# Patient Record
Sex: Female | Born: 1950
Health system: Southern US, Community
[De-identification: ages and names within clinical notes are randomized; demographics above are authoritative.]

## PROBLEM LIST (undated history)

## (undated) DIAGNOSIS — E785 Hyperlipidemia, unspecified: Secondary | ICD-10-CM

## (undated) DIAGNOSIS — M9909 Segmental and somatic dysfunction of abdomen and other regions: Secondary | ICD-10-CM

## (undated) DIAGNOSIS — D259 Leiomyoma of uterus, unspecified: Secondary | ICD-10-CM

## (undated) DIAGNOSIS — Z8719 Personal history of other diseases of the digestive system: Secondary | ICD-10-CM

## (undated) DIAGNOSIS — Z8673 Personal history of transient ischemic attack (TIA), and cerebral infarction without residual deficits: Secondary | ICD-10-CM

## (undated) DIAGNOSIS — R32 Unspecified urinary incontinence: Secondary | ICD-10-CM

## (undated) DIAGNOSIS — Z860101 Personal history of adenomatous and serrated colon polyps: Secondary | ICD-10-CM

## (undated) DIAGNOSIS — I10 Essential (primary) hypertension: Secondary | ICD-10-CM

## (undated) DIAGNOSIS — K08109 Complete loss of teeth, unspecified cause, unspecified class: Secondary | ICD-10-CM

## (undated) DIAGNOSIS — T8859XA Other complications of anesthesia, initial encounter: Secondary | ICD-10-CM

## (undated) DIAGNOSIS — I2510 Atherosclerotic heart disease of native coronary artery without angina pectoris: Secondary | ICD-10-CM

## (undated) DIAGNOSIS — K449 Diaphragmatic hernia without obstruction or gangrene: Secondary | ICD-10-CM

## (undated) DIAGNOSIS — K219 Gastro-esophageal reflux disease without esophagitis: Secondary | ICD-10-CM

## (undated) DIAGNOSIS — G894 Chronic pain syndrome: Secondary | ICD-10-CM

## (undated) DIAGNOSIS — M1990 Unspecified osteoarthritis, unspecified site: Secondary | ICD-10-CM

## (undated) DIAGNOSIS — N3281 Overactive bladder: Secondary | ICD-10-CM

## (undated) DIAGNOSIS — G4733 Obstructive sleep apnea (adult) (pediatric): Secondary | ICD-10-CM

## (undated) DIAGNOSIS — Z8041 Family history of malignant neoplasm of ovary: Secondary | ICD-10-CM

## (undated) DIAGNOSIS — G709 Myoneural disorder, unspecified: Secondary | ICD-10-CM

## (undated) DIAGNOSIS — S92902A Unspecified fracture of left foot, initial encounter for closed fracture: Secondary | ICD-10-CM

## (undated) DIAGNOSIS — M2550 Pain in unspecified joint: Secondary | ICD-10-CM

## (undated) DIAGNOSIS — G4730 Sleep apnea, unspecified: Secondary | ICD-10-CM

## (undated) DIAGNOSIS — N200 Calculus of kidney: Secondary | ICD-10-CM

## (undated) DIAGNOSIS — J309 Allergic rhinitis, unspecified: Secondary | ICD-10-CM

## (undated) DIAGNOSIS — D219 Benign neoplasm of connective and other soft tissue, unspecified: Secondary | ICD-10-CM

## (undated) DIAGNOSIS — Z87442 Personal history of urinary calculi: Secondary | ICD-10-CM

## (undated) DIAGNOSIS — E7800 Pure hypercholesterolemia, unspecified: Secondary | ICD-10-CM

## (undated) DIAGNOSIS — M1711 Unilateral primary osteoarthritis, right knee: Secondary | ICD-10-CM

## (undated) DIAGNOSIS — N9489 Other specified conditions associated with female genital organs and menstrual cycle: Secondary | ICD-10-CM

## (undated) DIAGNOSIS — N939 Abnormal uterine and vaginal bleeding, unspecified: Secondary | ICD-10-CM

## (undated) DIAGNOSIS — IMO0002 Reserved for concepts with insufficient information to code with codable children: Secondary | ICD-10-CM

## (undated) DIAGNOSIS — J849 Interstitial pulmonary disease, unspecified: Secondary | ICD-10-CM

## (undated) DIAGNOSIS — G8929 Other chronic pain: Secondary | ICD-10-CM

## (undated) DIAGNOSIS — M7552 Bursitis of left shoulder: Secondary | ICD-10-CM

## (undated) DIAGNOSIS — R918 Other nonspecific abnormal finding of lung field: Secondary | ICD-10-CM

## (undated) DIAGNOSIS — L230 Allergic contact dermatitis due to metals: Secondary | ICD-10-CM

## (undated) DIAGNOSIS — R7303 Prediabetes: Secondary | ICD-10-CM

## (undated) DIAGNOSIS — I781 Nevus, non-neoplastic: Secondary | ICD-10-CM

## (undated) HISTORY — PX: DENTAL SURGERY: SHX609

## (undated) HISTORY — DX: Essential (primary) hypertension: I10

## (undated) HISTORY — DX: Gastro-esophageal reflux disease without esophagitis: K21.9

## (undated) HISTORY — PX: HAMMER TOE SURGERY: SHX385

## (undated) HISTORY — DX: Bursitis of left shoulder: M75.52

## (undated) HISTORY — DX: Sleep apnea, unspecified: G47.30

## (undated) HISTORY — DX: Allergic rhinitis, unspecified: J30.9

## (undated) HISTORY — DX: Reserved for concepts with insufficient information to code with codable children: IMO0002

## (undated) HISTORY — DX: Atherosclerotic heart disease of native coronary artery without angina pectoris: I25.10

## (undated) HISTORY — DX: Unilateral primary osteoarthritis, right knee: M17.11

## (undated) HISTORY — DX: Calculus of kidney: N20.0

## (undated) HISTORY — PX: CARPAL TUNNEL RELEASE: SHX101

## (undated) HISTORY — PX: CATARACT EXTRACTION: SUR2

## (undated) HISTORY — DX: Unspecified urinary incontinence: R32

## (undated) HISTORY — DX: Abnormal uterine and vaginal bleeding, unspecified: N93.9

## (undated) HISTORY — DX: Unspecified fracture of left foot, initial encounter for closed fracture: S92.902A

## (undated) HISTORY — DX: Pure hypercholesterolemia, unspecified: E78.00

## (undated) HISTORY — DX: Other chronic pain: G89.29

## (undated) HISTORY — DX: Interstitial pulmonary disease, unspecified: J84.9

## (undated) HISTORY — DX: Nevus, non-neoplastic: I78.1

## (undated) HISTORY — PX: CATARACT EXTRACTION W/ INTRAOCULAR LENS IMPLANT: SHX1309

## (undated) HISTORY — DX: Benign neoplasm of connective and other soft tissue, unspecified: D21.9

## (undated) HISTORY — PX: SEPTOPLASTY: SUR1290

---

## 1988-01-17 HISTORY — PX: HAMMER TOE SURGERY: SHX385

## 1988-01-17 HISTORY — PX: PELVIC LAPAROSCOPY: SHX162

## 1996-01-17 HISTORY — PX: CARPAL TUNNEL RELEASE: SHX101

## 1997-10-19 ENCOUNTER — Encounter: Payer: Self-pay | Admitting: Obstetrics and Gynecology

## 1997-10-19 ENCOUNTER — Ambulatory Visit (HOSPITAL_COMMUNITY): Admission: RE | Admit: 1997-10-19 | Discharge: 1997-10-19 | Payer: Self-pay | Admitting: Obstetrics and Gynecology

## 1998-01-16 HISTORY — PX: DILATION AND CURETTAGE OF UTERUS: SHX78

## 1998-01-16 HISTORY — PX: OTHER SURGICAL HISTORY: SHX169

## 1998-01-16 HISTORY — PX: NASAL SEPTUM SURGERY: SHX37

## 1998-03-18 ENCOUNTER — Ambulatory Visit (HOSPITAL_COMMUNITY): Admission: RE | Admit: 1998-03-18 | Discharge: 1998-03-18 | Payer: Self-pay | Admitting: Obstetrics and Gynecology

## 1998-03-30 ENCOUNTER — Ambulatory Visit: Admission: RE | Admit: 1998-03-30 | Discharge: 1998-03-30 | Payer: Self-pay | Admitting: *Deleted

## 1998-07-12 ENCOUNTER — Ambulatory Visit (HOSPITAL_COMMUNITY): Admission: RE | Admit: 1998-07-12 | Discharge: 1998-07-12 | Payer: Self-pay | Admitting: Specialist

## 1998-07-12 ENCOUNTER — Encounter: Payer: Self-pay | Admitting: Specialist

## 1998-08-03 ENCOUNTER — Ambulatory Visit (HOSPITAL_COMMUNITY): Admission: RE | Admit: 1998-08-03 | Discharge: 1998-08-03 | Payer: Self-pay | Admitting: Specialist

## 1998-08-03 ENCOUNTER — Encounter: Payer: Self-pay | Admitting: Specialist

## 1998-08-20 ENCOUNTER — Encounter: Payer: Self-pay | Admitting: Specialist

## 1998-08-20 ENCOUNTER — Ambulatory Visit (HOSPITAL_COMMUNITY): Admission: RE | Admit: 1998-08-20 | Discharge: 1998-08-20 | Payer: Self-pay | Admitting: Specialist

## 1998-09-16 ENCOUNTER — Other Ambulatory Visit: Admission: RE | Admit: 1998-09-16 | Discharge: 1998-09-16 | Payer: Self-pay | Admitting: *Deleted

## 1998-09-16 ENCOUNTER — Encounter (INDEPENDENT_AMBULATORY_CARE_PROVIDER_SITE_OTHER): Payer: Self-pay | Admitting: Specialist

## 1999-03-09 ENCOUNTER — Other Ambulatory Visit: Admission: RE | Admit: 1999-03-09 | Discharge: 1999-03-09 | Payer: Self-pay | Admitting: Obstetrics and Gynecology

## 2000-06-22 ENCOUNTER — Encounter: Payer: Self-pay | Admitting: Obstetrics and Gynecology

## 2000-06-22 ENCOUNTER — Ambulatory Visit (HOSPITAL_COMMUNITY): Admission: RE | Admit: 2000-06-22 | Discharge: 2000-06-22 | Payer: Self-pay | Admitting: Obstetrics and Gynecology

## 2000-07-06 ENCOUNTER — Other Ambulatory Visit: Admission: RE | Admit: 2000-07-06 | Discharge: 2000-07-06 | Payer: Self-pay | Admitting: Obstetrics and Gynecology

## 2000-07-06 ENCOUNTER — Encounter (INDEPENDENT_AMBULATORY_CARE_PROVIDER_SITE_OTHER): Payer: Self-pay

## 2000-11-30 ENCOUNTER — Ambulatory Visit (HOSPITAL_COMMUNITY): Admission: RE | Admit: 2000-11-30 | Discharge: 2000-11-30 | Payer: Self-pay | Admitting: Obstetrics and Gynecology

## 2000-11-30 ENCOUNTER — Encounter: Payer: Self-pay | Admitting: Obstetrics and Gynecology

## 2001-07-02 ENCOUNTER — Encounter: Payer: Self-pay | Admitting: Obstetrics and Gynecology

## 2001-07-02 ENCOUNTER — Ambulatory Visit (HOSPITAL_COMMUNITY): Admission: RE | Admit: 2001-07-02 | Discharge: 2001-07-02 | Payer: Self-pay | Admitting: Obstetrics and Gynecology

## 2001-09-02 ENCOUNTER — Ambulatory Visit (HOSPITAL_COMMUNITY): Admission: RE | Admit: 2001-09-02 | Discharge: 2001-09-02 | Payer: Self-pay | Admitting: Obstetrics and Gynecology

## 2001-09-02 ENCOUNTER — Encounter: Payer: Self-pay | Admitting: Obstetrics and Gynecology

## 2002-02-24 ENCOUNTER — Encounter: Payer: Self-pay | Admitting: Obstetrics and Gynecology

## 2002-02-24 ENCOUNTER — Ambulatory Visit (HOSPITAL_COMMUNITY): Admission: RE | Admit: 2002-02-24 | Discharge: 2002-02-24 | Payer: Self-pay | Admitting: Obstetrics and Gynecology

## 2002-05-26 ENCOUNTER — Encounter: Payer: Self-pay | Admitting: Obstetrics and Gynecology

## 2002-05-26 ENCOUNTER — Ambulatory Visit (HOSPITAL_COMMUNITY): Admission: RE | Admit: 2002-05-26 | Discharge: 2002-05-26 | Payer: Self-pay | Admitting: Obstetrics and Gynecology

## 2003-01-12 ENCOUNTER — Encounter: Admission: RE | Admit: 2003-01-12 | Discharge: 2003-01-12 | Payer: Self-pay | Admitting: Obstetrics and Gynecology

## 2003-01-26 ENCOUNTER — Encounter: Admission: RE | Admit: 2003-01-26 | Discharge: 2003-01-26 | Payer: Self-pay | Admitting: Obstetrics and Gynecology

## 2003-06-17 ENCOUNTER — Encounter: Admission: RE | Admit: 2003-06-17 | Discharge: 2003-06-17 | Payer: Self-pay | Admitting: Obstetrics and Gynecology

## 2003-07-29 ENCOUNTER — Other Ambulatory Visit: Admission: RE | Admit: 2003-07-29 | Discharge: 2003-07-29 | Payer: Self-pay | Admitting: Obstetrics and Gynecology

## 2004-03-24 ENCOUNTER — Encounter: Admission: RE | Admit: 2004-03-24 | Discharge: 2004-03-24 | Payer: Self-pay | Admitting: Obstetrics and Gynecology

## 2004-04-05 ENCOUNTER — Encounter: Admission: RE | Admit: 2004-04-05 | Discharge: 2004-04-05 | Payer: Self-pay | Admitting: Obstetrics and Gynecology

## 2004-08-15 ENCOUNTER — Encounter: Admission: RE | Admit: 2004-08-15 | Discharge: 2004-08-15 | Payer: Self-pay | Admitting: Obstetrics and Gynecology

## 2004-08-15 ENCOUNTER — Other Ambulatory Visit: Admission: RE | Admit: 2004-08-15 | Discharge: 2004-08-15 | Payer: Self-pay | Admitting: Obstetrics and Gynecology

## 2004-08-30 ENCOUNTER — Encounter: Admission: RE | Admit: 2004-08-30 | Discharge: 2004-08-30 | Payer: Self-pay | Admitting: Obstetrics and Gynecology

## 2004-10-25 ENCOUNTER — Ambulatory Visit (HOSPITAL_COMMUNITY): Admission: RE | Admit: 2004-10-25 | Discharge: 2004-10-25 | Payer: Self-pay | Admitting: Obstetrics and Gynecology

## 2005-05-30 ENCOUNTER — Ambulatory Visit (HOSPITAL_COMMUNITY): Admission: RE | Admit: 2005-05-30 | Discharge: 2005-05-30 | Payer: Self-pay | Admitting: Obstetrics and Gynecology

## 2005-08-15 ENCOUNTER — Other Ambulatory Visit: Admission: RE | Admit: 2005-08-15 | Discharge: 2005-08-15 | Payer: Self-pay | Admitting: Obstetrics and Gynecology

## 2005-08-31 ENCOUNTER — Encounter: Admission: RE | Admit: 2005-08-31 | Discharge: 2005-08-31 | Payer: Self-pay | Admitting: Obstetrics and Gynecology

## 2006-09-06 ENCOUNTER — Other Ambulatory Visit: Admission: RE | Admit: 2006-09-06 | Discharge: 2006-09-06 | Payer: Self-pay | Admitting: Obstetrics and Gynecology

## 2006-10-09 ENCOUNTER — Encounter: Admission: RE | Admit: 2006-10-09 | Discharge: 2006-10-09 | Payer: Self-pay | Admitting: Obstetrics and Gynecology

## 2007-10-08 ENCOUNTER — Other Ambulatory Visit: Admission: RE | Admit: 2007-10-08 | Discharge: 2007-10-08 | Payer: Self-pay | Admitting: Obstetrics and Gynecology

## 2007-10-16 ENCOUNTER — Encounter: Admission: RE | Admit: 2007-10-16 | Discharge: 2007-10-16 | Payer: Self-pay | Admitting: Obstetrics and Gynecology

## 2008-01-17 HISTORY — PX: UMBILICAL HERNIA REPAIR: SHX196

## 2008-01-17 HISTORY — PX: OTHER SURGICAL HISTORY: SHX169

## 2008-01-17 HISTORY — PX: ESOPHAGEAL DILATION: SHX303

## 2008-05-15 ENCOUNTER — Encounter: Admission: RE | Admit: 2008-05-15 | Discharge: 2008-05-15 | Payer: Self-pay | Admitting: Obstetrics and Gynecology

## 2008-10-21 ENCOUNTER — Other Ambulatory Visit: Admission: RE | Admit: 2008-10-21 | Discharge: 2008-10-21 | Payer: Self-pay | Admitting: Obstetrics and Gynecology

## 2008-11-24 ENCOUNTER — Encounter: Admission: RE | Admit: 2008-11-24 | Discharge: 2008-11-24 | Payer: Self-pay | Admitting: Obstetrics and Gynecology

## 2009-10-21 ENCOUNTER — Other Ambulatory Visit: Admission: RE | Admit: 2009-10-21 | Discharge: 2009-10-21 | Payer: Self-pay | Admitting: Obstetrics and Gynecology

## 2010-12-27 ENCOUNTER — Encounter: Payer: Self-pay | Admitting: Family Medicine

## 2010-12-27 DIAGNOSIS — G8929 Other chronic pain: Secondary | ICD-10-CM | POA: Insufficient documentation

## 2010-12-27 DIAGNOSIS — F32A Depression, unspecified: Secondary | ICD-10-CM | POA: Insufficient documentation

## 2010-12-27 DIAGNOSIS — F419 Anxiety disorder, unspecified: Secondary | ICD-10-CM | POA: Insufficient documentation

## 2010-12-27 DIAGNOSIS — J309 Allergic rhinitis, unspecified: Secondary | ICD-10-CM | POA: Insufficient documentation

## 2010-12-27 DIAGNOSIS — I10 Essential (primary) hypertension: Secondary | ICD-10-CM | POA: Insufficient documentation

## 2010-12-27 DIAGNOSIS — F329 Major depressive disorder, single episode, unspecified: Secondary | ICD-10-CM | POA: Insufficient documentation

## 2010-12-27 DIAGNOSIS — K219 Gastro-esophageal reflux disease without esophagitis: Secondary | ICD-10-CM | POA: Insufficient documentation

## 2010-12-27 DIAGNOSIS — G4730 Sleep apnea, unspecified: Secondary | ICD-10-CM | POA: Insufficient documentation

## 2010-12-29 ENCOUNTER — Ambulatory Visit (INDEPENDENT_AMBULATORY_CARE_PROVIDER_SITE_OTHER): Payer: BC Managed Care – PPO | Admitting: Family Medicine

## 2010-12-29 ENCOUNTER — Encounter: Payer: Self-pay | Admitting: Family Medicine

## 2010-12-29 DIAGNOSIS — R5381 Other malaise: Secondary | ICD-10-CM

## 2010-12-29 DIAGNOSIS — E669 Obesity, unspecified: Secondary | ICD-10-CM | POA: Insufficient documentation

## 2010-12-29 DIAGNOSIS — I10 Essential (primary) hypertension: Secondary | ICD-10-CM

## 2010-12-29 DIAGNOSIS — R5383 Other fatigue: Secondary | ICD-10-CM | POA: Insufficient documentation

## 2010-12-29 DIAGNOSIS — G8929 Other chronic pain: Secondary | ICD-10-CM

## 2010-12-29 MED ORDER — DOXYCYCLINE HYCLATE 100 MG PO CAPS: 100.0000 mg | ORAL_CAPSULE | Freq: Every day | ORAL | Status: AC

## 2010-12-29 MED ORDER — FLUCONAZOLE 150 MG PO TABS: 150.0000 mg | ORAL_TABLET | Freq: Once | ORAL | Status: AC

## 2010-12-29 NOTE — Patient Instructions (Signed)
It is good to see you. I will put in for labs bites decide if you are switching to me or not. If you are doing let's get the labs have a new check your cholesterol kidneys and liver. I really would like to see some of the other labs you have had in the last 2 years. We will try to contact her other doctors and get the information. If you have these please bring them to the front desk and we will make copies. I am giving you a prescription for doxycycline. Take one pill daily for the next 3 weeks and we will see if this improves everything. When taking this antibiotic in my give you a yeast infection so I will give you another medication just in case called Diflucan. I should probably see you again in about one month for adjustment as well as recheck your blood pressure.

## 2010-12-29 NOTE — Assessment & Plan Note (Signed)
Patient is not quite at goal we'll continue to monitor. Patient's will either bring in her labs were we will get some at the next visit. Patient may need a very low dose diuretic to help with the swelling as well as her blood pressure.

## 2010-12-29 NOTE — Assessment & Plan Note (Signed)
Encourage patient to attempt to lose weight. Told patient about diet as well as exercise and some low-impact exercises to try to avoid hurting her knee. Patient will come back again in the p.m. little more aggressive setting goals of 2-5 pounds every month of weight loss. Likely contributing to some of her chronic pain.

## 2010-12-29 NOTE — Progress Notes (Signed)
Subjective:    Patient ID: Kerry [name], female    DOB: Jan 30, 1950, 60 y.o.   MRN: [id number]  HPI New patient here to establish care. Patient is seen before in my moonlighting services for osteopathic manipulation therapy. Patient is here again for the same aches and pains she's had previously patient complains of some back and hip pain as well as right knee pain. Patient sees other specialists including the orthopedic physician who states that she does have some osteoarthritis of the right knee and she was to avoid any type of surgery. Patient did have significant amount of improvement when she had manipulation previously.  Patient has had a long history of swelling of her extremities with a lot of joint and muscle pain. Patient has been seen by multiple rheumatologist she states in the past. Last one was back at [facility] rheumatology and she's been tried on multiple medications and has not had any improvement. Patient states that she's had a lot of lab work done and all but has come back negative during this time. Patient does have a strong family history of rheumatoid arthritis in a sister as well as her mother and is concerned that this is the problem. Patient though is very sensitive to a lot of different medications and certain medications that other providers have encourage her to try she has not. Patient pain at this moment does take some over-the-counter ibuprofen and has been doing a lot of other natural herbs and supplementations. Patient has recently taken a probiotic which she states has helped with some of the swelling in her upper extremities.   History of hypertension-patient's blood pressure today is a little bit elevated at 150/82. On recheck the patient is 136/80. Patient has not taken any medications has been doing this mostly with diet and trying to do it naturally. Patient has not had labs for some time but states that she does have some at home but she will bring at her next  earliest convenience.  Review of Systems Denies fever, chills, nausea vomiting abdominal pain, dysuria, chest pain, shortness of breath dyspnea on exertion or numbness in extremities Past Medical History  Diagnosis Date  . Allergic rhinitis, cause unspecified   . Anxiety   . Depression   . Chronic pain   . GERD (gastroesophageal reflux disease)   . HTN (hypertension)   . Sleep apnea   . Migraines    Past Surgical History  Procedure Date  . Umbilical hernia repair 2010  . Carpal tunnel release 1998  . Dental implants 2010  . Esophageal dilation 2010   History   Social History  . Marital Status: Married    Spouse Name: N/A    Number of Children: N/A  . Years of Education: N/A   Occupational History  . Not on file.   Social History Main Topics  . Smoking status: Never Smoker   . Smokeless tobacco: Not on file  . Alcohol Use: No  . Drug Use: No  . Sexually Active: Yes   Other Topics Concern  . Not on file   Social History Narrative  . No narrative on file       Objective:   Physical Exam BP 136/82  Pulse 101  Temp(Src) 98.2 F (36.8 C) (Oral)  Ht 5' (1.524 m)  Wt 222 lb (100.699 kg)  BMI 43.36 kg/m2 General appearance: alert and cooperative obese Ears: normal TM's and external ear canals both ears Throat: lips, mucosa, and tongue normal; teeth and  gums normal Neck: no adenopathy, supple, symmetrical, trachea midline and thyroid not enlarged, symmetric, no tenderness/mass/nodules Lungs: clear to auscultation bilaterally Heart: regular rate and rhythm, S1, S2 normal, no murmur, click, rub or gallop Abdomen: soft, non-tender; bowel sounds normal; no masses,  no organomegaly Extremities: extremities normal, atraumatic, no cyanosis or edema patient has mild crepitus of the right knee and a positive McMurray's on the right side. In addition patient has some swelling of the proximal and distal phalanges of both hands. Pulses: 2+ and symmetric Skin: Skin color,  texture, turgor normal. No rashes or lesions patient does have a little bit of a malar rash on face. Neurologic: Grossly normal   OMT Findings: Cervical: C2 flexed rotated and side bent left Thoracic: T4 extended rotated and side bent right Lumbar: L2 rotated and side bent left Sacrum: Left on left  Assessment & Plan:

## 2010-12-29 NOTE — Assessment & Plan Note (Signed)
Patient has a long-standing history of this chronic pain type syndrome. Patient has been seen many specialists in the past and will get some of her notes and reviewed what labs are very been drawn. With patient's findings this is consistent with the potential of rheumatoid arthritis as well as lupus in the differential. Patient does have some signs of osteoarthritis of the right knee as well. Patient though is not on any narcotics and is not asking for many narcotics. We will attempt to see if pain will be well-controlled with Tylenol 650 mg 3 times a day have patient followup again in 3-4 weeks time.

## 2011-01-06 ENCOUNTER — Other Ambulatory Visit: Payer: BC Managed Care – PPO

## 2011-01-20 ENCOUNTER — Telehealth: Payer: Self-pay | Admitting: Family Medicine

## 2011-01-20 NOTE — Telephone Encounter (Signed)
Regarding records from Rheumatologist.

## 2011-01-20 NOTE — Telephone Encounter (Signed)
Pt states she will bring the records she has to her next OV 01/27/2011.

## 2011-01-26 ENCOUNTER — Telehealth: Payer: Self-pay | Admitting: Family Medicine

## 2011-01-26 NOTE — Telephone Encounter (Signed)
Kerry [name] wants to find out if Dr. Katrinka Blazing has gotten the Medical Records for her appointment on 1/11.  They are not in his box.  Please give her a call.

## 2011-01-26 NOTE — Telephone Encounter (Signed)
Have not seen, will forward to MD. Milas Gain, Maryjo Rochester

## 2011-01-26 NOTE — Telephone Encounter (Signed)
Pt informed. Kerry [name]  

## 2011-01-26 NOTE — Telephone Encounter (Signed)
Yes,  I have received over 100 pages and will be looking at them tonight. If you have time please call the pt and tell her otherwise I will tell her tomorrow.

## 2011-01-27 ENCOUNTER — Other Ambulatory Visit: Payer: Self-pay | Admitting: Family Medicine

## 2011-01-27 ENCOUNTER — Ambulatory Visit: Payer: Self-pay | Admitting: Family Medicine

## 2011-01-27 ENCOUNTER — Ambulatory Visit (INDEPENDENT_AMBULATORY_CARE_PROVIDER_SITE_OTHER): Payer: BC Managed Care – PPO | Admitting: Family Medicine

## 2011-01-27 ENCOUNTER — Encounter: Payer: Self-pay | Admitting: Family Medicine

## 2011-01-27 DIAGNOSIS — G8929 Other chronic pain: Secondary | ICD-10-CM

## 2011-01-27 DIAGNOSIS — R5383 Other fatigue: Secondary | ICD-10-CM

## 2011-01-27 DIAGNOSIS — I10 Essential (primary) hypertension: Secondary | ICD-10-CM

## 2011-01-27 DIAGNOSIS — E669 Obesity, unspecified: Secondary | ICD-10-CM

## 2011-01-27 DIAGNOSIS — M999 Biomechanical lesion, unspecified: Secondary | ICD-10-CM | POA: Insufficient documentation

## 2011-01-27 NOTE — Progress Notes (Signed)
Subjective:    Patient ID: Kerry [name], female    DOB: Jul 18, 1950, 61 y.o.   MRN: [id number]  HPI Patient is seen before in my moonlighting services for osteopathic manipulation therapy. Patient is here again for the same aches and pains she's had previously patient complains of some back and hip pain as well as right knee pain. Patient sees other specialists including the orthopedic physician who states that she does have some osteoarthritis of the right knee and she was to avoid any type of surgery. Patient did have significant amount of improvement when she had manipulation previously. Patient states though after last manipulation she did have a flare in the cervical region and like to avoid much in cervical area.   Patient has had a long history of swelling of her extremities with a lot of joint and muscle pain. Patient has been seen by multiple rheumatologist she states in the past.patient has had this chronic pain for some time has seen multiple rheumatologist during the time. Did have past medical office visits faxed to me. Reviewed them and at this time it appears that patient has been seronegative on autoimmune tests. Patient does have a strong family history of rheumatoid arthritis in a sister as well as her mother and is concerned that this is the problem. Patient states that whenever she tries to increase any of her activity she starts to have spasm of the muscle and so she stops any type of stretching or physical activity.  History of hypertension-patient's blood pressure today is a little bit elevated again 171/88. Recheck still elevated. Patient has not taken any medications has been doing this mostly with diet and trying to do it naturally. Patient's labs were faxed over unfortunately she has not had for greater than 15 months we will recheck labs at next appointment. Patient though did have a significant finding of hyperglycemia on one with an A1c of 6.2%  Patient has also had a cold  for the last couple weeks patient states that she's been having some head congestion mostly right-sided has had this in the past before. Patient states that she has had fever x100.5 the other day but since that time has not felt quite as fevers. Patient though still feels she has had congestion and would like evaluation. Patient likes to avoid that the headaches in any other type of medications at all costs. Review of Systems  Denies fever, chills, nausea vomiting abdominal pain, dysuria, chest pain, shortness of breath dyspnea on exertion or numbness in extremities Past Medical History  Diagnosis Date  . Allergic rhinitis, cause unspecified   . Anxiety   . Depression   . Chronic pain   . GERD (gastroesophageal reflux disease)   . HTN (hypertension)   . Sleep apnea   . Migraines    Past Surgical History  Procedure Date  . Umbilical hernia repair 2010  . Carpal tunnel release 1998  . Dental implants 2010  . Esophageal dilation 2010   History   Social History  . Marital Status: Married    Spouse Name: N/A    Number of Children: N/A  . Years of Education: N/A   Occupational History  . Not on file.   Social History Main Topics  . Smoking status: Never Smoker   . Smokeless tobacco: Not on file  . Alcohol Use: No  . Drug Use: No  . Sexually Active: Yes   Other Topics Concern  . Not on file   Social History  Narrative  . No narrative on file       Objective:   Physical Exam  BP 171/88  Pulse 99  Temp(Src) 98.3 F (36.8 C) (Oral)  Ht 5' (1.524 m)  Wt 221 lb (100.245 kg)  BMI 43.16 kg/m2 General appearance: alert and cooperative obese Ears: Patient shows signs of pus behind the right TM no erythema no dementia and nonbulging Neck: no adenopathy, supple, symmetrical, trachea midline and thyroid not enlarged, symmetric, no tenderness/mass/nodules Lungs: clear to auscultation bilaterally Heart: regular rate and rhythm, S1, S2 normal, no murmur, click, rub or  gallop Abdomen: soft, non-tender; bowel sounds normal; no masses,  no organomegaly Extremities: extremities normal, atraumatic, no cyanosis or edema In addition patient has some swelling of the proximal and distal phalanges of both hands. Seems to be patient's baseline. Pulses: 2+ and symmetric Skin: Skin color, texture, turgor normal. No rashes or lesions patient does have a little bit of a malar rash on face. Neurologic: Grossly normal   OMT Findings: Cervical: C2 flexed rotated and side bent left Thoracic: T4 extended rotated and side bent right Lumbar: L2 rotated and side bent left Sacrum: Left on left  Assessment & Plan:

## 2011-01-27 NOTE — Assessment & Plan Note (Signed)
Still elevated at this time. Told patient I will give her until next followup appointment. At that time if still elevated would really need to consider actually getting patient on medication. Discussed the risks and benefits of treating hypertension patient declined medication at this time.

## 2011-01-27 NOTE — Assessment & Plan Note (Signed)
Encourage patient to lose weight encourage patient to check her diet as well as increase her exercises discussing proper technique with exercising and timing. Discuss patient's concerns and side effects from exercising told patient to focus on range of motion for the next week and range of motion and good stability a week after that and then consider starting an exercise program for cardiovascular.

## 2011-01-27 NOTE — Patient Instructions (Signed)
Good to see you. For your sinuses I would continue doing the saline flushes or netty Pots. Try doing range of motion exercises for all joints for the next week then start stretching the week after that. I need to see you again in 2-3 weeks to get labs as well as check your blood pressure again. Try to make it a morning appointment so you can come fasting.

## 2011-01-27 NOTE — Assessment & Plan Note (Signed)
After verbal consent patient was treated with muscle energy and articulatory technique with marked improvement focusing mostly on a right anterior ilium and lumbar region. Patient given exercises to do. Discussed biofeedback and tactile stimulation during exercise which might help patient's better in recent what she likes pool exercising.

## 2011-01-27 NOTE — Assessment & Plan Note (Signed)
Very difficult to treat we will try a manipulative manual therapy for some time see if this does help. If not likely this is more secondary to patient's obesity as well as psychological factors. Patient once again will attempt to take Tylenol likely for the first time which will be very helpful. But unfortunately I feel patient may become noncompliant. We'll monitor very closely and encourage patient to continue diet and exercise which I think will help more than anything else.

## 2011-02-07 ENCOUNTER — Telehealth: Payer: Self-pay | Admitting: Family Medicine

## 2011-02-07 NOTE — Telephone Encounter (Signed)
Yes I do have the fax.  Thank you. These were only the rheumatologic labs and they were fine, only need cholesterol and CMET which are in the computer.

## 2011-02-07 NOTE — Telephone Encounter (Signed)
Spoke with MD, he does want other labs as well.  Pt informed. Kerry [name], Maryjo Rochester

## 2011-02-07 NOTE — Telephone Encounter (Signed)
I will call and let her know.  Do I need to cancel the Vit D, A1c, TSH, and Vit B12?   Im afraid they will get drawn as well since they are in a futures, or do you want them drawn also?

## 2011-02-07 NOTE — Telephone Encounter (Signed)
Pt calling re: records that were faxed here from [facility] on 12/29/10, needs to be sure Dr. Katrinka Blazing received these, if not they can refax. Pt scheduled to be here this week for lab and doesn't want to have repeat labs done if they are not needed.

## 2011-02-07 NOTE — Telephone Encounter (Signed)
Yes all labs in are correct sorry for the confusion.

## 2011-02-09 ENCOUNTER — Ambulatory Visit (INDEPENDENT_AMBULATORY_CARE_PROVIDER_SITE_OTHER): Payer: BC Managed Care – PPO | Admitting: Family Medicine

## 2011-02-09 ENCOUNTER — Encounter: Payer: Self-pay | Admitting: Family Medicine

## 2011-02-09 ENCOUNTER — Other Ambulatory Visit: Payer: BC Managed Care – PPO

## 2011-02-09 DIAGNOSIS — G8929 Other chronic pain: Secondary | ICD-10-CM

## 2011-02-09 DIAGNOSIS — R5383 Other fatigue: Secondary | ICD-10-CM

## 2011-02-09 DIAGNOSIS — R5381 Other malaise: Secondary | ICD-10-CM

## 2011-02-09 DIAGNOSIS — E669 Obesity, unspecified: Secondary | ICD-10-CM

## 2011-02-09 DIAGNOSIS — I10 Essential (primary) hypertension: Secondary | ICD-10-CM

## 2011-02-09 DIAGNOSIS — M999 Biomechanical lesion, unspecified: Secondary | ICD-10-CM

## 2011-02-09 LAB — POCT GLYCOSYLATED HEMOGLOBIN (HGB A1C): Hemoglobin A1C: 6

## 2011-02-09 LAB — COMPREHENSIVE METABOLIC PANEL
ALT: 21 U/L (ref 0–35)
Alkaline Phosphatase: 52 U/L (ref 39–117)
CO2: 25 mEq/L (ref 19–32)
Creat: 0.65 mg/dL (ref 0.50–1.10)
Glucose, Bld: 101 mg/dL — ABNORMAL HIGH (ref 70–99)
Sodium: 141 mEq/L (ref 135–145)
Total Bilirubin: 0.4 mg/dL (ref 0.3–1.2)
Total Protein: 7.2 g/dL (ref 6.0–8.3)

## 2011-02-09 LAB — VITAMIN B12: Vitamin B-12: 393 pg/mL (ref 211–911)

## 2011-02-09 LAB — LIPID PANEL
Cholesterol: 257 mg/dL — ABNORMAL HIGH (ref 0–200)
HDL: 46 mg/dL (ref >39)
Total CHOL/HDL Ratio: 5.6 Ratio
VLDL: 27 mg/dL (ref 0–40)

## 2011-02-09 LAB — TSH: TSH: 1.516 u[IU]/mL (ref 0.350–4.500)

## 2011-02-09 MED ORDER — VITAMIN C 500 MG PO CAPS: 500.0000 mg | ORAL_CAPSULE | Freq: Every day | ORAL | Status: DC

## 2011-02-09 NOTE — Assessment & Plan Note (Addendum)
Patient has had this chronic pain for a long time. Patient is responding somewhat to manipulation therapy he'll continue as needed. Looking through patient's notes patient did have a positive Lyme titer couple years ago. Would be very interested in for a potential treatment of 3 weeks of doxycycline which patient has prescription for her. Patient does not take it yet so because she is waiting for her husband to come back into town. Patient is seen some integrated medicine people as well during this course of time there is a concern for potential yeast chronic infection we can consider treatment of that as well if this does fail. Otherwise all rheumatological blood values have been negative and does not seem to be autoimmune  in nature. Patient is somewhat against western medicine and the medications that were prescribed sore continue to try to treat the pain with natural substance We'll get labs as well for the and B12 to see if this might be causing some problems. Patient does have well water might need to consider heavy-metal toxins as well.

## 2011-02-09 NOTE — Patient Instructions (Signed)
Back Exercises These exercises may help you when beginning to rehabilitate your injury. Your symptoms may resolve with or without further involvement from your physician, physical therapist or athletic trainer. While completing these exercises, remember:   Restoring tissue flexibility helps normal motion to return to the joints. This allows healthier, less painful movement and activity.   An effective stretch should be held for at least 30 seconds.   A stretch should never be painful. You should only feel a gentle lengthening or release in the stretched tissue.  STRETCH - Extension, Prone on Elbows   Lie on your stomach on the floor, a bed will be too soft. Place your palms about shoulder width apart and at the height of your head.   Place your elbows under your shoulders. If this is too painful, stack pillows under your chest.   Allow your body to relax so that your hips drop lower and make contact more completely with the floor.   Hold this position for __________ seconds.   Slowly return to lying flat on the floor.  Repeat __________ times. Complete this exercise __________ times per day.  RANGE OF MOTION - Extension, Prone Press Ups   Lie on your stomach on the floor, a bed will be too soft. Place your palms about shoulder width apart and at the height of your head.   Keeping your back as relaxed as possible, slowly straighten your elbows while keeping your hips on the floor. You may adjust the placement of your hands to maximize your comfort. As you gain motion, your hands will come more underneath your shoulders.   Hold this position __________ seconds.   Slowly return to lying flat on the floor.  Repeat __________ times. Complete this exercise __________ times per day.  RANGE OF MOTION- Quadruped, Neutral Spine   Assume a hands and knees position on a firm surface. Keep your hands under your shoulders and your knees under your hips. You may place padding under your knees for  comfort.   Drop your head and point your tail bone toward the ground below you. This will round out your low back like an angry cat. Hold this position for __________ seconds.   Slowly lift your head and release your tail bone so that your back sags into a large arch, like an old horse.   Hold this position for __________ seconds.   Repeat this until you feel limber in your low back.   Now, find your "sweet spot." This will be the most comfortable position somewhere between the two previous positions. This is your neutral spine. Once you have found this position, tense your stomach muscles to support your low back.   Hold this position for __________ seconds.  Repeat __________ times. Complete this exercise __________ times per day.  STRETCH - Flexion, Single Knee to Chest   Lie on a firm bed or floor with both legs extended in front of you.   Keeping one leg in contact with the floor, bring your opposite knee to your chest. Hold your leg in place by either grabbing behind your thigh or at your knee.   Pull until you feel a gentle stretch in your low back. Hold __________ seconds.   Slowly release your grasp and repeat the exercise with the opposite side.  Repeat __________ times. Complete this exercise __________ times per day.  STRETCH - Hamstrings, Standing  Stand or sit and extend your right / left leg, placing your foot on a chair   or foot stool   Keeping a slight arch in your low back and your hips straight forward.   Lead with your chest and lean forward at the waist until you feel a gentle stretch in the back of your right / left knee or thigh. (When done correctly, this exercise requires leaning only a small distance.)   Hold this position for __________ seconds.  Repeat __________ times. Complete this stretch __________ times per day. STRENGTHENING - Deep Abdominals, Pelvic Tilt   Lie on a firm bed or floor. Keeping your legs in front of you, bend your knees so they are  both pointed toward the ceiling and your feet are flat on the floor.   Tense your lower abdominal muscles to press your low back into the floor. This motion will rotate your pelvis so that your tail bone is scooping upwards rather than pointing at your feet or into the floor.   With a gentle tension and even breathing, hold this position for __________ seconds.  Repeat __________ times. Complete this exercise __________ times per day.  STRENGTHENING - Abdominals, Crunches   Lie on a firm bed or floor. Keeping your legs in front of you, bend your knees so they are both pointed toward the ceiling and your feet are flat on the floor. Cross your arms over your chest.   Slightly tip your chin down without bending your neck.   Tense your abdominals and slowly lift your trunk high enough to just clear your shoulder blades. Lifting higher can put excessive stress on the low back and does not further strengthen your abdominal muscles.   Control your return to the starting position.  Repeat __________ times. Complete this exercise __________ times per day.  STRENGTHENING - Quadruped, Opposite UE/LE Lift   Assume a hands and knees position on a firm surface. Keep your hands under your shoulders and your knees under your hips. You may place padding under your knees for comfort.   Find your neutral spine and gently tense your abdominal muscles so that you can maintain this position. Your shoulders and hips should form a rectangle that is parallel with the floor and is not twisted.   Keeping your trunk steady, lift your right hand no higher than your shoulder and then your left leg no higher than your hip. Make sure you are not holding your breath. Hold this position __________ seconds.   Continuing to keep your abdominal muscles tense and your back steady, slowly return to your starting position. Repeat with the opposite arm and leg.  Repeat __________ times. Complete this exercise __________ times per  day. Document Released: 01/20/2005 Document Revised: 09/14/2010 Document Reviewed: 04/16/2008 [employer] Patient Information 2012 [employer]. 

## 2011-02-09 NOTE — Assessment & Plan Note (Signed)
At goal at this time on no medications we'll continue to monitor. Patient did prematurely today and shows a systolic blood pressures normally between 120 and 1:30.

## 2011-02-09 NOTE — Progress Notes (Signed)
  Subjective:    Patient ID: Kerry [name], female    DOB: 02-13-1950, 61 y.o.   MRN: [id number]  HPI  F/u OMT- patient has been seen for chronic back pain as well as neck region. Patient had significant improvement for multiple days after last treatment especially with her knee pain. Patient states that it has gradually started to come back and giving her trouble. Patient has not been doing the exercises as religiously as she should. Denies any new pains same chronic aches and pains that she normally has. Did have some type of viral infection a while back but seems to be improving.  Chronic complaints- Patient has had a long history of swelling of her extremities with a lot of joint and muscle pain. Patient has been seen by multiple rheumatologist she states in the past.patient has had this chronic pain for some time has seen multiple rheumatologist during the time. . Patient states that whenever she tries to increase any of her activity she starts to have spasm of the muscle and so she stops any type of stretching or physical activity.   Hypertension Blood pressure at home: Not checking Blood pressure today: 127/84 Taking Meds: No Side effects: None ROS: Denies headache visual changes nausea, vomiting, chest pain or abdominal pain or shortness of breath.   Review of Systems  Denies fever, chills, nausea vomiting abdominal pain, dysuria, chest pain, shortness of breath dyspnea on exertion or numbness in extremities     Objective:   Physical Exam  BP 127/84  Pulse 80  Temp(Src) 98.3 F (36.8 C) (Oral)  Ht 5' (1.524 m)  Wt 223 lb (101.152 kg)  BMI 43.55 kg/m2 General appearance: alert and cooperative obese Ears: Patient shows signs of pus behind the right TM no erythema no dementia and nonbulging Neck: no adenopathy, supple, symmetrical, trachea midline and thyroid not enlarged, symmetric, no tenderness/mass/nodules Lungs: clear to auscultation bilaterally Heart: regular rate and  rhythm, S1, S2 normal, no murmur, click, rub or gallop Abdomen: soft, non-tender; bowel sounds normal; no masses,  no organomegaly Extremities: extremities normal, atraumatic, no cyanosis or edema In addition patient has some swelling of the proximal and distal phalanges of both hands. Seems to be patient's baseline. Pulses: 2+ and symmetric Skin: Skin color, texture, turgor normal. No rashes or lesions patient does have a little bit of a malar rash on face. Neurologic: Grossly normal   OMT Findings: Cervical: C2 flexed rotated and side bent left Thoracic: T4 extended rotated and side bent right Lumbar: L2 rotated and side bent left Sacrum: Left on left  Assessment & Plan:

## 2011-02-09 NOTE — Assessment & Plan Note (Signed)
After verbal consent pt did have HVLA, with marked improvement.  Gave side effects to look out for and can take anti inflammatories in the acute time frame.   

## 2011-02-10 ENCOUNTER — Encounter: Payer: Self-pay | Admitting: Family Medicine

## 2011-02-10 LAB — VITAMIN D 25 HYDROXY (VIT D DEFICIENCY, FRACTURES): Vit D, 25-Hydroxy: 43 ng/mL (ref 30–89)

## 2011-02-20 ENCOUNTER — Telehealth: Payer: Self-pay | Admitting: Family Medicine

## 2011-02-20 DIAGNOSIS — R5383 Other fatigue: Secondary | ICD-10-CM

## 2011-02-20 NOTE — Telephone Encounter (Signed)
Needs an order for Bartonella study.  She is coming in next week to see Dr. Katrinka Blazing and needs the order for 3 labs, Monday, Wednesday and Friday.  She also has several questions to ask but doesn't know if she needs to schedule a separate appt.  She would like Dr. Katrinka Blazing to call her.  She also needs to know if these need to be scheduled in the morning, for instance fasting?

## 2011-02-20 NOTE — Telephone Encounter (Signed)
Called pt back and asked if she has checked with [facility] about if any lab can do it. Sge states it is fine to have [place] draw it and send it out.  P thas a form and will bring it to her blood draw.  Needs 12cc of whole blood in 2 tubes on M, W, F during the coarse of the week and then would be shipped to an outside lab associated with [facility] for research.  Will forward message to Kendal Hymen and see what she thinks.

## 2011-02-21 NOTE — Telephone Encounter (Signed)
Dr. Katrinka Blazing, We can handle drawing labs and sending the sample out.  Have done this many times. Kendal Hymen [place]

## 2011-02-27 ENCOUNTER — Other Ambulatory Visit: Payer: BC Managed Care – PPO

## 2011-02-27 DIAGNOSIS — R5383 Other fatigue: Secondary | ICD-10-CM

## 2011-02-27 NOTE — Progress Notes (Signed)
BARTONELLA ANTIBODY DONE TODAY Kerry [name]

## 2011-02-27 NOTE — Progress Notes (Signed)
WRG TEST ORDERED FROM DR SMITH,CANCELED  FROM [facility].PT HAVING RESEARCH STUDY DONE  . PER BONNIE [place]

## 2011-03-01 ENCOUNTER — Ambulatory Visit (INDEPENDENT_AMBULATORY_CARE_PROVIDER_SITE_OTHER): Payer: BC Managed Care – PPO | Admitting: Family Medicine

## 2011-03-01 ENCOUNTER — Encounter: Payer: Self-pay | Admitting: Family Medicine

## 2011-03-01 VITALS — BP 134/82 | HR 88 | Temp 98.5°F | Ht 60.0 in | Wt 225.0 lb

## 2011-03-01 DIAGNOSIS — M79673 Pain in unspecified foot: Secondary | ICD-10-CM

## 2011-03-01 DIAGNOSIS — M7062 Trochanteric bursitis, left hip: Secondary | ICD-10-CM

## 2011-03-01 DIAGNOSIS — M25561 Pain in right knee: Secondary | ICD-10-CM

## 2011-03-01 DIAGNOSIS — M2140 Flat foot [pes planus] (acquired), unspecified foot: Secondary | ICD-10-CM

## 2011-03-01 DIAGNOSIS — M76899 Other specified enthesopathies of unspecified lower limb, excluding foot: Secondary | ICD-10-CM

## 2011-03-01 DIAGNOSIS — M25571 Pain in right ankle and joints of right foot: Secondary | ICD-10-CM

## 2011-03-01 DIAGNOSIS — M999 Biomechanical lesion, unspecified: Secondary | ICD-10-CM

## 2011-03-01 DIAGNOSIS — M25579 Pain in unspecified ankle and joints of unspecified foot: Secondary | ICD-10-CM

## 2011-03-01 DIAGNOSIS — M25569 Pain in unspecified knee: Secondary | ICD-10-CM

## 2011-03-01 DIAGNOSIS — M79609 Pain in unspecified limb: Secondary | ICD-10-CM

## 2011-03-01 MED ORDER — DICLOFENAC SODIUM 1 % TD GEL: 1.0000 "application " | Freq: Two times a day (BID) | TRANSDERMAL | Status: DC

## 2011-03-01 NOTE — Assessment & Plan Note (Signed)
Likely secondary to patient compensating for her right knee and foot pain. Patient has very mild amount overall. Patient declined injection today. Patient would like to try the Voltaren gel as well discussed with her that this likely will not help as much as will help her knee and bursitis there because it's much more superficial. Patient did have some manipulation done today did have some improvement patient declined oral steroids as well as oral anti-inflammatories. We'll see if patient improves with alignments of the feet with improvement on orthotics. If continues to worsen wouldn't consent to sports medicine for her ultrasound and potential injection.

## 2011-03-01 NOTE — Patient Instructions (Signed)
Good to see you. We will consider doing an injection in your pes anserine in March As of now I will get a gel they will apply twice daily I when she to try B6 100 mg daily I'm going to give you the B12 injections starting on Friday I when she to look into going to [employer] store and getting wide shoes Also you can go to [facility] and get [facility] orthotics

## 2011-03-01 NOTE — Progress Notes (Signed)
Subjective:    Patient ID: Kerry [name], female    DOB: 06/03/1950, 61 y.o.   MRN: [id number]  HPI 61 year old female coming in with multiple medical problems. Patient does have an appointment in 2 days so we halved to patient's appointments. Today we are mostly the patient's muscle skeletal complaints. Right foot pain-patient is change shoes has not had quite as much pain but still having on and off that it's makes her limp during the day. Patient also had a injury in February 2000 a 10 when she stepped in a hole in her yard and heard a pop and had pain patient states she went to a chiropractic doctor in [place] who told her that her talus bone without successfully pocket in. Patient though during this course of time has also gained a lot of weight and thinks this might be causing it. Patient states she normally has an sore spot on the dorsal aspect of her foot but yesterday she dropped a bottle of shampoo on it and now the foot feels better. Patient denies any numbness or weakness of the foot denies worsening pain throughout the day or in the morning. States worse with walking sometimes seems to be better when she's sitting.  Right knee pain-patient has had right knee pain for quite some time. Patient has been told before That she has tricompartmental osteoarthritis of the right knee. Patient though was never told that surgery was an option for her. Patient states that she does feel grinding and she's lost some range of motion of the knee over the course of time. Patient does have manipulation of the knee before and it did seem to improve somewhat. Patient denies any radiation of pain  Left hip pain patient has this usually after her right knee pain hurts more. Patient does not remember any injury no radiation of pain down the left leg. Patient states it mostly hurts on the outside of the hip no pain when she crosses her legs. Does not feel unstable when she walks denies any numbness or tingling  in the extremity. Review of Systems As stated in history of present illness    Objective:   Physical Exam Vitals reviewed General appearance: alert and cooperative obese Lungs: clear to auscultation bilaterally Heart: regular rate and rhythm, S1, S2 normal, no murmur, click, rub or gallop Abdomen: soft, non-tender; bowel sounds normal; no masses,  no organomegaly Extremities: extremities normal, atraumatic, no cyanosis or edema In addition patient has some swelling of the proximal and distal phalanges of both hands. Seems to be patient's baseline. Pulses: 2+ and symmetric   OMT Findings: Cervical: C2 flexed rotated and side bent left Thoracic: T4 extended rotated and side bent right Lumbar: L2 rotated and side bent left Sacrum: Left on left  Ankle: No visible erythema or swelling. Range of motion is full in all directions. Strength is 5/5 in all directions. Stable lateral and medial ligaments; squeeze test and kleiger test unremarkable; Talar dome nontender; No pain at base of 5th MT; No tenderness over cuboid; No tenderness over N spot or navicular prominence No tenderness on posterior aspects of lateral and medial malleolus No sign of peroneal tendon subluxations; Negative tarsal tunnel tinel's Able to walk 4 steps. Patient does have breakdown of the longitudinal as well as transverse arches of the feet bilaterally  Knee: Right Normal to inspection with no erythema or effusion or obvious bony abnormalities. Palpation reveals tenderness of the medial joint line ROM normal in flexion and extension and  lower leg rotation. Ligaments with solid consistent endpoints including ACL, PCL, LCL, the patient does have some mild laxity of the MCL. Negative Mcmurray's and provocative meniscal tests. Non painful patellar compression. Patellar and quadriceps tendons unremarkable. Hamstring. Tight right-sided and quadriceps strength is normal.  Left hip patient has full range of motion of  the hip does have some mild pain with significant abduction patient is tender to palpation just posterior to the greater trochanter area likely bursitis.      Assessment & Plan:

## 2011-03-01 NOTE — Assessment & Plan Note (Signed)
Discussed at length with patient given multiple options. Discussed the potential of this pain mostly osteoarthritis as well as a little bit of pes anserine bursitis. Patient declined injections today but did choose to try simple-appearing gel. We will do a trial of this to see if it improves. Patient may opt for injections or later date. We will not do any imaging but if patient does fail injections and we'll get imaging tests likely will have to refer her to orthopedics for potential Synvisc injection. Gave some exercises to see if this would improve as well.

## 2011-03-01 NOTE — Assessment & Plan Note (Signed)
After verbal consent pt did have HVLA, with marked improvement.  Gave side effects to look out for and can take anti inflammatories in the acute time frame.   

## 2011-03-01 NOTE — Assessment & Plan Note (Signed)
Patient given arch supports bilaterally to wear daily. Patient also told to get over-the-counter orthotics to try and see if this improves some of her foot pain. Do feel a lot of this is alignment feet that is causing her other chronic pain and this might be helpful. Encourage her to also lose weight which would be very helpful.

## 2011-03-02 ENCOUNTER — Telehealth: Payer: Self-pay | Admitting: *Deleted

## 2011-03-02 NOTE — Telephone Encounter (Signed)
PA was completed and faxed to insurance company. Approval was received   from 02/09/2011 to 05/31/2011. Pharmacy notified.

## 2011-03-03 ENCOUNTER — Ambulatory Visit (INDEPENDENT_AMBULATORY_CARE_PROVIDER_SITE_OTHER): Payer: BC Managed Care – PPO | Admitting: Family Medicine

## 2011-03-03 ENCOUNTER — Encounter: Payer: Self-pay | Admitting: Family Medicine

## 2011-03-03 VITALS — BP 130/84 | HR 90 | Ht 60.0 in | Wt 226.0 lb

## 2011-03-03 DIAGNOSIS — M25561 Pain in right knee: Secondary | ICD-10-CM

## 2011-03-03 DIAGNOSIS — M26609 Unspecified temporomandibular joint disorder, unspecified side: Secondary | ICD-10-CM

## 2011-03-03 DIAGNOSIS — E538 Deficiency of other specified B group vitamins: Secondary | ICD-10-CM

## 2011-03-03 DIAGNOSIS — M25569 Pain in unspecified knee: Secondary | ICD-10-CM

## 2011-03-03 MED ORDER — NEEDLES & SYRINGES MISC: 1.0000 | Status: DC

## 2011-03-03 MED ORDER — CYANOCOBALAMIN 1000 MCG/ML IJ SOLN: INTRAMUSCULAR | Status: DC
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
## 2011-03-03 NOTE — Patient Instructions (Signed)
In her is that it is good to see you. Call and make an appointment with Larita Fife for your B12 injection when you want. I will send in a prescription for you to do them at home as well. Do the exercises I showed you for your TMJ problem. I think this will help Also when you are up front you can make an appointment with sports medicine so they can ultrasound your knee. If you take this paper to them they can see that I would like them to evaluate the right MCL and meniscus do to laxity. I think you should make an appointment with me in 3-4 weeks for another manipulation.

## 2011-03-05 DIAGNOSIS — M26609 Unspecified temporomandibular joint disorder, unspecified side: Secondary | ICD-10-CM | POA: Insufficient documentation

## 2011-03-05 DIAGNOSIS — E538 Deficiency of other specified B group vitamins: Secondary | ICD-10-CM | POA: Insufficient documentation

## 2011-03-05 NOTE — Assessment & Plan Note (Signed)
Pt given multiple stretches to do at home, as well as consider mouthgaurd at night Discussed ice at end of day and then gave options if these fail.   Pt will try conservative measures and see if improve.

## 2011-03-05 NOTE — Assessment & Plan Note (Signed)
B12 mildly low, will do injections, have pt get first B12 injection here by nurse to show pt then she can do it herself at home, recheck labs in 3 months.

## 2011-03-05 NOTE — Assessment & Plan Note (Signed)
Pt has chronic pain and do feel likely most of it s arthritis, does have some laxity of the MCL on right side in comparison of left side.  Will send pt down to sports medicine for ultrasound and to rule out degenerative meniscal tear (mcmurray negative). Pt will continue to do exercises at home.

## 2011-03-14 ENCOUNTER — Ambulatory Visit: Payer: BC Managed Care – PPO | Admitting: Family Medicine

## 2011-03-15 ENCOUNTER — Ambulatory Visit (INDEPENDENT_AMBULATORY_CARE_PROVIDER_SITE_OTHER): Payer: BC Managed Care – PPO | Admitting: Family Medicine

## 2011-03-15 ENCOUNTER — Encounter: Payer: Self-pay | Admitting: Family Medicine

## 2011-03-15 VITALS — BP 120/72 | Ht 61.0 in | Wt 220.0 lb

## 2011-03-15 DIAGNOSIS — M25561 Pain in right knee: Secondary | ICD-10-CM

## 2011-03-15 DIAGNOSIS — M25569 Pain in unspecified knee: Secondary | ICD-10-CM
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
## 2011-03-16 ENCOUNTER — Ambulatory Visit (INDEPENDENT_AMBULATORY_CARE_PROVIDER_SITE_OTHER): Payer: BC Managed Care – PPO | Admitting: *Deleted

## 2011-03-16 DIAGNOSIS — E538 Deficiency of other specified B group vitamins: Secondary | ICD-10-CM

## 2011-03-16 MED ORDER — CYANOCOBALAMIN 1000 MCG/ML IJ SOLN
1000.0000 ug | Freq: Once | INTRAMUSCULAR | Status: AC
  Administered 2011-03-16: 1000 ug via INTRAMUSCULAR

## 2011-03-16 NOTE — Progress Notes (Signed)
Patient in office for first B12 injection. Injection given with instructions and patient feels that she will be able to do next time on her own.
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
## 2011-03-24 ENCOUNTER — Telehealth: Payer: Self-pay | Admitting: Family Medicine

## 2011-03-24 NOTE — Telephone Encounter (Signed)
Patient is calling to let Dr. Katrinka Blazing know that from Sports Medicine, he needs to request X-rays from Dr. 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

## 2011-03-27 ENCOUNTER — Ambulatory Visit (INDEPENDENT_AMBULATORY_CARE_PROVIDER_SITE_OTHER): Payer: BC Managed Care – PPO | Admitting: Family Medicine

## 2011-03-27 ENCOUNTER — Encounter: Payer: Self-pay | Admitting: Family Medicine

## 2011-03-27 DIAGNOSIS — M25561 Pain in right knee: Secondary | ICD-10-CM

## 2011-03-27 DIAGNOSIS — M999 Biomechanical lesion, unspecified: Secondary | ICD-10-CM

## 2011-03-27 DIAGNOSIS — I10 Essential (primary) hypertension: Secondary | ICD-10-CM

## 2011-03-27 DIAGNOSIS — M25569 Pain in unspecified knee: Secondary | ICD-10-CM

## 2011-03-27 DIAGNOSIS — G8929 Other chronic pain: Secondary | ICD-10-CM

## 2011-03-27 NOTE — Assessment & Plan Note (Signed)
At goal will make no changes.

## 2011-03-27 NOTE — Assessment & Plan Note (Signed)
Likely has severe DJD and likely would benefit from x-ray, pt declined at this time again.  Discuss pain likely exacerbated from her foot pain as well. Pt will be going to a foot size specialist to get properly fitted shoes and see if these improve.

## 2011-03-27 NOTE — Assessment & Plan Note (Signed)
Discussed at length again and found that patient is having a hard trouble coping again.  Pt though not willing to try western medicine but still states a lot of pain overall.  Do feel some underlying depression is contributing but pt having trouble to admit and declined PHQ9. Continue current therapy and will continue to monitor.  Would attempt to treat for chronic lyme disease or bacterial overgrowth syndrome if pt is incline, do not feel the treatment would cause too many problems.

## 2011-03-27 NOTE — Patient Instructions (Signed)
It is always good to see you. For the leaky gut syndrome I would like you to try to take a probiotic daily. Make sure it has lactobacillus and it. Also you should try to do either a gluten-free or Casein free diet I when she to continue with the exercises and stretches that you're doing. Try to go and get your feet fitted properly for shoes. I think this would make a significant change I think you are and 4E 6-1/2 if I was guessing Meds have you come back when you need another manipulation. Proximately 4-6 weeks

## 2011-03-27 NOTE — Progress Notes (Signed)
  Subjective:    Patient ID: Kerry [name], female    DOB: 1950/08/25, 60 y.o.   MRN: [id number]  HPI  1. Hypertension Blood pressure at home: not checking Blood pressure today: 110/74 Taking Meds:no all natural Side effects:no ROS: Denies headache visual changes nausea, vomiting, chest pain or abdominal pain or shortness of breath.  2.  Low B12-  Pt was started on inject B12, has improved, states she is having more energy, still on the weekly and going to monthly in 1 week.  No problem with giving self injection.   3. Right knee pain.  Was seen at [facility] and did have ultrasound and showed some DJD likely but no significant ligament or meniscal injury.  No x-rays on file, attempting to get X-ray from [facility] still. Pt states the knee does feel a little better overall since doing some message recently. Pt states still gies pain but able to do all ADL;s  Pt would like to hold on any intervention if possible including surgery and injections.   4. Foot pain-  Pt attempted to get orthotics but do not fit in the new shoes and is having trouble fitting her foot to shoes due to width of foot. Pt denies any worsening of the pain, wearing the arch bandages daily and improving as well.    Review of Systems As stated in the HPI    Objective:   Physical Exam Vitals reviewed General appearance: alert and cooperative, obese Lungs: clear to auscultation bilaterally Heart: regular rate and rhythm, S1, S2 normal, no murmur, click, rub or gallop Abdomen: soft, non-tender; bowel sounds normal; no masses,  no organomegaly Extremities: extremities normal, atraumatic, no cyanosis or edema. Pulses: 2+ and symmetric OMT Findings: Cervical: C2 flexed rotated and side bent left Thoracic: T4 extended rotated and side bent right Lumbar: L2 rotated and side bent left Sacrum: Left on left     Assessment & Plan:

## 2011-03-27 NOTE — Telephone Encounter (Signed)
Will address at visit.

## 2011-03-27 NOTE — Assessment & Plan Note (Signed)
After verbal consent pt did have HVLA, with marked improvement.  Gave side effects to look out for and can take anti inflammatories in the acute time frame.   

## 2011-04-10 ENCOUNTER — Telehealth: Payer: Self-pay | Admitting: Family Medicine

## 2011-04-10 NOTE — Telephone Encounter (Signed)
Has a question about her B-12 shots   Also has a question about where Dr Katrinka Blazing had recommended she go for special shoes

## 2011-04-11 NOTE — Telephone Encounter (Signed)
LMOVM informing pt...............................Trystin Terhune, CMA  

## 2011-04-11 NOTE — Telephone Encounter (Signed)
Pt states that she has taken 4 injections, but it has only been 3 weeks.  From what I can tell she gave herself one dose the beginning of the first week and the end of the first week.  Since her instructions say take for 4 weeks, she wants to know what to do.  Advised that I think the 4 doses was good and she could start monthly but that I would double check with MD.  Pt states that she cant remember where MD told her to go, thought [facility] but she cant find this anywhere.  Asked if she thought it was off and running but she was not sure.  Also advised I would check with MD on this.  Pt states that it is ok to LMOVM. Kodee Ravert, Maryjo [place]

## 2011-04-11 NOTE — Telephone Encounter (Signed)
Yes, ok to do injections monthly, at this time, no real chance to ever overdose with B12.  Also yes it is off and running, the other place to try is [facility].  [place] call and tell pt this, I am on vacation till wed.  Thank you

## 2011-04-18 ENCOUNTER — Ambulatory Visit: Payer: BC Managed Care – PPO | Admitting: Family Medicine

## 2011-04-25 ENCOUNTER — Encounter: Payer: Self-pay | Admitting: Family Medicine

## 2011-04-25 ENCOUNTER — Ambulatory Visit (INDEPENDENT_AMBULATORY_CARE_PROVIDER_SITE_OTHER): Payer: BC Managed Care – PPO | Admitting: Family Medicine

## 2011-04-25 VITALS — BP 140/92 | HR 100 | Ht 61.0 in | Wt 228.0 lb

## 2011-04-25 DIAGNOSIS — G8929 Other chronic pain: Secondary | ICD-10-CM

## 2011-04-25 DIAGNOSIS — M999 Biomechanical lesion, unspecified: Secondary | ICD-10-CM

## 2011-04-25 DIAGNOSIS — I10 Essential (primary) hypertension: Secondary | ICD-10-CM

## 2011-04-25 NOTE — Assessment & Plan Note (Signed)
After verbal consent pt did have HVLA, with marked improvement.  Gave side effects to look out for and can take anti inflammatories in the acute time frame. Continue home exercise program followup in 4-6 weeks.

## 2011-04-25 NOTE — Assessment & Plan Note (Signed)
Manipulation done again today seems to be improving significantly. Patient is doing better overall we'll continue current therapy.

## 2011-04-25 NOTE — Progress Notes (Signed)
  Subjective:    Patient ID: Kerry [name], female    DOB: 1950-03-12, 61 y.o.   MRN: [id number]  HPI   1. Hypertension Blood pressure at home: not checking Blood pressure today: 140/92 Taking Meds:no all natural Side effects:no ROS: Denies headache visual changes nausea, vomiting, chest pain or abdominal pain or shortness of breath.   2. Foot pain-  patient has been wearing the arch bandages and these have helped tremendously. In addition this patient did get orthotics but they were the wrong size for her shoes she has not replaced them yet. Patient has gotten new shoes or fitted for her at often running which he thinks is been helpful. Patient states that the pain is approximately 4/10 compared to where it was previously, stating that this is a 60% improvement. Patient still has trouble increase her walking secondary to the pain but has started doing more swimming exercises.  3. chronic pain-patient has been having osteopathic manipulation for thoracic lumbar pain. Patient has had significant improvement with this as well as the massages that she has been getting on a regular basis. Patient states she still has some mild aches and pains but has been significantly better than she has been in multiple months maybe years.    Review of Systems  As stated in the HPI    Objective:   Physical Exam  Vitals reviewed General appearance: alert and cooperative, obese Lungs: clear to auscultation bilaterally Heart: regular rate and rhythm, S1, S2 normal, no murmur, click, rub or gallop Abdomen: soft, non-tender; bowel sounds normal; no masses,  no organomegaly Extremities: extremities normal, atraumatic, no cyanosis or edema. Pulses: 2+ and symmetric OMT Findings: Cervical: C2 flexed rotated and side bent left Thoracic: T4 extended rotated and side bent right Lumbar: L2 rotated and side bent left Sacrum: Left on left     Assessment & Plan:

## 2011-04-25 NOTE — Assessment & Plan Note (Signed)
Patient is still little bit elevated again today. Patient though would not like to take any medications we'll continue to monitor closely.

## 2011-05-19 ENCOUNTER — Ambulatory Visit (INDEPENDENT_AMBULATORY_CARE_PROVIDER_SITE_OTHER): Payer: BC Managed Care – PPO | Admitting: Family Medicine

## 2011-05-19 VITALS — BP 126/86 | HR 80

## 2011-05-19 DIAGNOSIS — M2140 Flat foot [pes planus] (acquired), unspecified foot: Secondary | ICD-10-CM

## 2011-05-19 DIAGNOSIS — E669 Obesity, unspecified: Secondary | ICD-10-CM

## 2011-05-19 DIAGNOSIS — G8929 Other chronic pain: Secondary | ICD-10-CM

## 2011-05-19 DIAGNOSIS — I10 Essential (primary) hypertension: Secondary | ICD-10-CM

## 2011-05-21 ENCOUNTER — Encounter: Payer: Self-pay | Admitting: Family Medicine

## 2011-05-21 NOTE — Assessment & Plan Note (Signed)
Will have her try to get a toe expander in her shoe at night and see if we can stretch the forefoot area of her shoes, this can help with the rubbing I think.  F/u PRN.

## 2011-05-21 NOTE — Assessment & Plan Note (Signed)
Once again likely root of most of her problems, still does not seem completely motivated to lose the weight.  Encouraged her to increase activity.  Discuss possible nutrition consult which she declined.  Not ready to set a goal weight for next visit.

## 2011-05-21 NOTE — Assessment & Plan Note (Signed)
Patient is at goal at this time, will continue to monitor closely.

## 2011-05-21 NOTE — Assessment & Plan Note (Signed)
Pt responded well to OMT and has increase her activity. Still has not lose any weight at this time but we will see how patient does in the long run.  Encourage her to continue to try to lose weight which I think is the main reason she has so much pain.  Encourage her to do more water aerobics if she feels that helps.  F/u in 3-4 weeks for manipulation again.

## 2011-05-21 NOTE — Progress Notes (Signed)
  Subjective:    Patient ID: Kerry [name], female    DOB: 1950/12/27, 61 y.o.   MRN: [id number]  HPI   1. Hypertension Blood pressure at home: not checking Blood pressure today: 128/82 Taking Meds:no all natural Side effects:no ROS: Denies headache visual changes nausea, vomiting, chest pain or abdominal pain or shortness of breath.   2. Foot pain- f/u-  Still giving her pain but has improved some.  Pt states she is trying low impact exercises which has helped, but has not been doing any exercises to strengthen the arch of the foot well. Pt states her new shoes and insoles has helped but still having some pain in the toes area from rubbing.   3. chronic pain-patient has been having osteopathic manipulation for thoracic, lumbar pain. Patient has had significant improvement with this as well as the massages that she has been getting on a regular basis.  Patient states she still has some mild aches and pains but has been significantly better than she has been in multiple months maybe years.  Patient though states this is the best she has felt in a long time.     Review of Systems  As stated in the HPI    Objective:   Physical Exam  Vitals reviewed General appearance: alert and cooperative, obese Lungs: clear to auscultation bilaterally Heart: regular rate and rhythm, S1, S2 normal, no murmur, click, rub or gallop Abdomen: soft, non-tender; bowel sounds normal; no masses,  no organomegaly Extremities: extremities normal, atraumatic, no cyanosis or edema. Pulses: 2+ and symmetric OMT Findings: Cervical: C2 flexed rotated and side bent left Thoracic: T4 extended rotated and side bent right Lumbar: L2 rotated and side bent left Sacrum: Left on left     Assessment & Plan:

## 2011-06-08 ENCOUNTER — Encounter: Payer: Self-pay | Admitting: Family Medicine

## 2011-06-08 ENCOUNTER — Ambulatory Visit (INDEPENDENT_AMBULATORY_CARE_PROVIDER_SITE_OTHER): Payer: BC Managed Care – PPO | Admitting: Family Medicine

## 2011-06-08 VITALS — BP 138/80 | HR 72 | Temp 98.0°F | Ht 60.25 in | Wt 225.7 lb

## 2011-06-08 DIAGNOSIS — M999 Biomechanical lesion, unspecified: Secondary | ICD-10-CM

## 2011-06-08 DIAGNOSIS — G8929 Other chronic pain: Secondary | ICD-10-CM

## 2011-06-08 DIAGNOSIS — I10 Essential (primary) hypertension: Secondary | ICD-10-CM

## 2011-06-09 NOTE — Assessment & Plan Note (Signed)
We'll continue to monitor closely. Patient still not on any medications. Encourage exercise and diet control.

## 2011-06-09 NOTE — Assessment & Plan Note (Signed)
Decision today to treat with OMT was based on Physical Exam  After verbal consent patient was treated with HVLA and muscle energy and myofascial techniques in lumbosacral areas  Patient tolerated the procedure well with improvement in symptoms  Patient given exercises, stretches and lifestyle modifications  See medications in patient instructions if given  Patient will follow up in 3-4 weeks

## 2011-06-09 NOTE — Assessment & Plan Note (Signed)
Manipulation again today. Encourage patient to start a regular physical activity stressing that some pain is normal. Encourage patient to keep up the activity for greater than 2 weeks at that point usually the muscle seemed to improve. Encourage more low-impact exercises such as biking as well as swimming. Patient wanted again declined any type of prescription medication. Patient will continue to try such things as gluten-free diet. Patient will return in 3-4 weeks for another manipulation.

## 2011-06-09 NOTE — Patient Instructions (Signed)
Patient given verbal instructions 

## 2011-06-09 NOTE — Progress Notes (Signed)
  Subjective:    Patient ID: Kerry [name], female    DOB: March 24, 1950, 61 y.o.   MRN: [id number]  HPI   1. Hypertension Blood pressure at home: not checking Blood pressure today: 138/80 Taking Meds:no all natural Side effects:no ROS: Denies headache visual changes nausea, vomiting, chest pain or abdominal pain or shortness of breath.   2. Foot pain- f/u-  Still giving her pain but has improved some. Patient has gotten new shoes since last time which has helped as well. Pt states has stopped exercising no, and has not been doing any exercises to strengthen the arch of the foot. Patient states that overall he might be better.  3. chronic pain-patient has been having osteopathic manipulation for thoracic, lumbar pain. Patient has had significant improvement with this as well as the massages that she has been getting on a regular basis. Patient though stated that she's tried to start stretching more and this caused more pain in the left hip. Patient then decided it would be best if she stopped all her physical activity. Patient states for the pain does seem to be getting worse and she has stopped all the activity. Patient points mostly of her left hip being the worst.  Patient also got labs back about her Bartonella, which was negative but does not know if she wants to take the doxycycline that was given to her multiple months ago to try to treat for potential chronic Lyme disease.  Review of Systems  As stated in the HPI    Objective:   Physical Exam  Vitals reviewed General appearance: alert and cooperative, obese Lungs: clear to auscultation bilaterally Heart: regular rate and rhythm, S1, S2 normal, no murmur, click, rub or gallop Abdomen: soft, non-tender; bowel sounds normal; no masses,  no organomegaly Extremities: extremities normal, atraumatic, no cyanosis or edema. Pulses: 2+ and symmetric OMT Findings: Cervical: Did not treat Thoracic: T4 extended rotated and side bent  right Lumbar: L2 rotated and side bent left Sacrum: Left on left Left ileum posteriorly rotated Hip: ROM IR: 80 Deg, ER: 80 Deg, Flexion: 120 Deg, Extension: 100 Deg, Abduction: 45 Deg, Adduction: 45 Deg Strength IR: 5/5, ER: 5/5, Flexion: 5/5, Extension: 5/5, Abduction: 3/5, Adduction: 5/5 Greater trochanter with tenderness to palpation. No tenderness over piriformis and greater trochanter. No SI joint tenderness and normal minimal SI movement.     Assessment & Plan:

## 2011-06-13 ENCOUNTER — Ambulatory Visit: Payer: BC Managed Care – PPO | Admitting: Family Medicine

## 2011-06-28 ENCOUNTER — Ambulatory Visit: Payer: BC Managed Care – PPO | Admitting: Family Medicine

## 2011-07-05 ENCOUNTER — Ambulatory Visit (INDEPENDENT_AMBULATORY_CARE_PROVIDER_SITE_OTHER): Payer: BC Managed Care – PPO | Admitting: Family Medicine

## 2011-07-05 ENCOUNTER — Encounter: Payer: Self-pay | Admitting: Family Medicine

## 2011-07-05 VITALS — BP 140/82 | HR 98 | Temp 98.9°F | Wt 224.0 lb

## 2011-07-05 DIAGNOSIS — E669 Obesity, unspecified: Secondary | ICD-10-CM

## 2011-07-05 DIAGNOSIS — M999 Biomechanical lesion, unspecified: Secondary | ICD-10-CM

## 2011-07-05 DIAGNOSIS — G8929 Other chronic pain: Secondary | ICD-10-CM

## 2011-07-05 DIAGNOSIS — R5381 Other malaise: Secondary | ICD-10-CM

## 2011-07-05 DIAGNOSIS — R5383 Other fatigue: Secondary | ICD-10-CM

## 2011-07-05 DIAGNOSIS — R739 Hyperglycemia, unspecified: Secondary | ICD-10-CM

## 2011-07-05 DIAGNOSIS — R7309 Other abnormal glucose: Secondary | ICD-10-CM

## 2011-07-05 NOTE — Assessment & Plan Note (Signed)
Decision today to treat with OMT was based on Physical Exam  After verbal consent patient was treated with ME, articular techniques in cervical and thoracic and lumbar areas  Patient tolerated the procedure well with improvement in symptoms  Patient given exercises, stretches and lifestyle modifications  See medications in patient instructions if given  Patient will follow up in 6 weeks

## 2011-07-05 NOTE — Progress Notes (Signed)
Patient ID: Kerry [name], female   DOB: 1950/07/15, 61 y.o.   MRN: [id number]  Subjective:    Patient ID: Kerry [name], female    DOB: 1950/04/29, 61 y.o.   MRN: [id number]  HPI  1. Hypertension Blood pressure at home: not checking Blood pressure today: 140/82 Taking Meds:no all natural Side effects:no ROS: Denies headache visual changes nausea, vomiting, chest pain or abdominal pain or shortness of breath.  Still does not want to take medication.    2. Patient states that she hass read recently that she may have mastocytosis and would like further workup.  Patient was reading and states this can be tied to ferritin levels and would like to have her check.  Patient has not seen a specialist for this before but feels that conventional medicine has failed her so far and would like to try an allergist and then more functional medicine.   3. chronic pain-patient has been having osteopathic manipulation for thoracic, lumbar pain. Patient has had significant improvement with this as well as the massages that she has been getting on a regular basis. Patient though stated that she's tried to start stretching more and this caused more pain in the left hip. Patient then decided it would be best if she stopped all her physical activity. Patient has started walking again and this has helped a little bit more and hope she can increase her activity further. Patient points mostly of her left hip being the worst.   Review of Systems As stated in the HPI    Objective:   Physical Exam Vitals reviewed General appearance: alert and cooperative, obese Lungs: clear to auscultation bilaterally Heart: regular rate and rhythm, S1, S2 normal, no murmur, click, rub or gallop Abdomen: soft, non-tender; bowel sounds normal; no masses,  no organomegaly Extremities: extremities normal, atraumatic, no cyanosis or edema. Pulses: 2+ and symmetric OMT Findings: Cervical: C2 F RS left Thoracic: T4 extended rotated  and side bent right Lumbar: L2 rotated and side bent left Sacrum: Left on left Left ileum posteriorly rotated.     Assessment & Plan:

## 2011-07-05 NOTE — Patient Instructions (Signed)
Good to see you. I will call you with your lab results. I have referred you to the specialists. Also we'll hear from [place] in the next couple weeks. You can call sports medicine at [phone]

## 2011-07-05 NOTE — Assessment & Plan Note (Signed)
Filed Weights   07/05/11 1617  Weight: 224 lb (101.606 kg)  still ellevated encouraged exercise and watching diet, declined nutritionist referral.

## 2011-07-05 NOTE — Assessment & Plan Note (Signed)
Patient is still responding to OMT and will follow me to [facility] for further manipulation.  Patient would like to see specialist about possible mastocytosis. Told her I do think she has a hypersensitivity reaction and that is about as much as I can tell her.  Will send to specialist and will try to discuss case further but do feel this is now outside my expertise. I would still say patient weight is a contributing factor.

## 2011-07-06 ENCOUNTER — Encounter: Payer: Self-pay | Admitting: Family Medicine

## 2011-07-06 ENCOUNTER — Telehealth: Payer: Self-pay | Admitting: Family Medicine

## 2011-07-06 LAB — ANEMIA PANEL 7
%SAT: 26 % (ref 20–55)
ABS Retic: 63.6 10*3/uL (ref 19.0–186.0)
Ferritin: 118 ng/mL (ref 10–291)
HCT: 42.4 % (ref 36.0–46.0)
MCV: 86.7 fL (ref 78.0–100.0)
RDW: 14.8 % (ref 11.5–15.5)
TIBC: 378 ug/dL (ref 250–470)
WBC: 7.1 10*3/uL (ref 4.0–10.5)

## 2011-07-06 NOTE — Telephone Encounter (Signed)
Called about lab results told her that ferritin normal which is good, anemia normal, will send in letter as well.

## 2011-07-07 ENCOUNTER — Telehealth: Payer: Self-pay | Admitting: Family Medicine

## 2011-07-07 NOTE — Telephone Encounter (Signed)
Called patient and discussed more about the mastocytosis. After discussing it likely that this would not change our management patient declined going for a bone marrow biopsy for the mastocytosis that was recommended by specialist. At this time we will look more into multiple chemical sensitivity or hypersensitivity reactions for her complaints. Patient may see a new primary Care provider in integrated medicine.

## 2011-07-26 DIAGNOSIS — M19049 Primary osteoarthritis, unspecified hand: Secondary | ICD-10-CM | POA: Insufficient documentation

## 2011-07-26 DIAGNOSIS — M545 Low back pain, unspecified: Secondary | ICD-10-CM | POA: Insufficient documentation

## 2011-07-26 DIAGNOSIS — M1710 Unilateral primary osteoarthritis, unspecified knee: Secondary | ICD-10-CM | POA: Insufficient documentation

## 2011-07-26 DIAGNOSIS — M2550 Pain in unspecified joint: Secondary | ICD-10-CM | POA: Insufficient documentation

## 2011-08-08 ENCOUNTER — Ambulatory Visit: Payer: BC Managed Care – PPO | Admitting: Family Medicine

## 2011-08-15 ENCOUNTER — Ambulatory Visit (INDEPENDENT_AMBULATORY_CARE_PROVIDER_SITE_OTHER): Payer: BC Managed Care – PPO | Admitting: Family Medicine

## 2011-08-15 VITALS — BP 157/97

## 2011-08-15 DIAGNOSIS — M2140 Flat foot [pes planus] (acquired), unspecified foot: Secondary | ICD-10-CM

## 2011-08-15 DIAGNOSIS — M25569 Pain in unspecified knee: Secondary | ICD-10-CM

## 2011-08-15 DIAGNOSIS — M999 Biomechanical lesion, unspecified: Secondary | ICD-10-CM

## 2011-08-15 DIAGNOSIS — G8929 Other chronic pain: Secondary | ICD-10-CM

## 2011-08-15 DIAGNOSIS — M25561 Pain in right knee: Secondary | ICD-10-CM

## 2011-08-15 NOTE — Assessment & Plan Note (Signed)
Patient continues to have pain at this time. We have seen her have significant foot pain is on the past and I think it's mostly in alignment issue that continues to give her a significant problem. Patient is responding to osteopathic manipulation but I do think orthotics would be beneficial in this patient. Patient is a hopefully come back after her trip in 3-4 weeks and at that time we'll consider making custom orthotics for patient.

## 2011-08-15 NOTE — Assessment & Plan Note (Signed)
I do believe this is more likely an alignment issue in the recent degenerative changes. Did not have any imaging at this time. We'll consider getting imaging the future but at this point I think it's more likely due to her feet and has orthotics might help. If she continues to have pain note also consider doing an injection to try to help.

## 2011-08-15 NOTE — Progress Notes (Signed)
Patient ID: Kerry [name], female   DOB: 11-25-1950, 61 y.o.   MRN: [id number]  Subjective:    Patient ID: Kerry [name], female    DOB: [birth year], 61 y.o.   MRN: [id number]  2. chronic pain-patient has been having osteopathic manipulation for thoracic, lumbar pain. Patient has had significant improvement with this as well as the massages that she has been getting on a regular basis. Patient though stated that she's tried to start stretching more and this caused more pain in the left hip. Patient also states that her right knee has been bothering her as much more than previously. Patient is able to do her regular activities of daily living but is still not doing regular physical activity to try to help with weight loss or her pain. Patient has changed her diet recently and going to a gluten-free diet which is seem to help may be somewhat but still having chronic discomfort. Patient once again it is not take any medications because she does not like to many substances in her body. Denies any radiation of pain denies any weakness in any extremities.  Review of Systems As stated in the HPI    Objective:   Physical Exam Vitals reviewed General appearance: alert and cooperative, obese Extremities: extremities normal, atraumatic, no cyanosis or edema. Pulses: 2+ and symmetric OMT Findings: Cervical: C2 F RS left Thoracic: T4 extended rotated and side bent right Lumbar: L2 rotated and side bent left Sacrum: Left on left Left ileum posteriorly rotated.     Assessment & Plan:

## 2011-08-15 NOTE — Assessment & Plan Note (Signed)
With patient's pain at this time I do think this could be causing on malalignment but is given her right knee pain as well as her left hip pain. I think she should continue doing the exercises I have given her. I have explained these exercises to include more hamstring stretching as well. Patient is going to come back after her trip out of town and at that time we'll continue to do more manipulation as well as consider getting custom orthotics at that time. If patient continues to have knee pain as well we will consider injection but I think that'll be not on the patient's agenda.

## 2011-08-15 NOTE — Assessment & Plan Note (Signed)
Decision today to treat with OMT was based on Physical Exam  After verbal consent patient was treated with HVLA and ME techniques in lumbar and sacrum areas  Patient tolerated the procedure well with improvement in symptoms  Patient given exercises, stretches and lifestyle modifications  See medications in patient instructions if given  Patient will follow up in 6 weeks

## 2011-08-31 ENCOUNTER — Telehealth: Payer: Self-pay | Admitting: *Deleted

## 2011-08-31 DIAGNOSIS — M25561 Pain in right knee: Secondary | ICD-10-CM

## 2011-08-31 NOTE — Telephone Encounter (Signed)
Message copied by Mora Bellman on Thu Aug 31, 2011 12:14 PM ------      Message from: Judi Saa      Created: Wed Aug 30, 2011  8:02 PM      Regarding: RE: physical therapy       Yes please send to formal physical therapy please. Thanks so much Lanasia Porras.      ----- Message -----         From: Mora Bellman, RN         Sent: 08/30/2011   4:53 PM           To: Mora Bellman, RN, Judi Saa, DO      Subject: physical therapy                                         Pt thought you were going to refer her to PT, but hasn't heard anything from them.  Did you want her to do formal PT or home exercises?  It sounded like [facility] wants [facility] at [facility].

## 2011-09-13 NOTE — Addendum Note (Signed)
Addended by: Jacki Cones C on: 09/13/2011 10:13 AM   Modules accepted: Orders

## 2011-09-13 NOTE — Addendum Note (Signed)
Addended by: Jacki Cones C on: 09/13/2011 10:25 AM   Modules accepted: Orders

## 2011-09-14 ENCOUNTER — Ambulatory Visit: Payer: BC Managed Care – PPO | Attending: Family Medicine

## 2011-09-14 DIAGNOSIS — IMO0001 Reserved for inherently not codable concepts without codable children: Secondary | ICD-10-CM | POA: Insufficient documentation

## 2011-09-14 DIAGNOSIS — M25569 Pain in unspecified knee: Secondary | ICD-10-CM | POA: Insufficient documentation

## 2011-09-14 DIAGNOSIS — M6281 Muscle weakness (generalized): Secondary | ICD-10-CM | POA: Insufficient documentation

## 2011-09-17 HISTORY — PX: ROTATOR CUFF REPAIR: SHX139

## 2011-09-20 ENCOUNTER — Encounter: Payer: Self-pay | Admitting: Sports Medicine

## 2011-09-20 ENCOUNTER — Ambulatory Visit: Payer: BC Managed Care – PPO | Admitting: Rehabilitative and Restorative Service Providers"

## 2011-09-20 ENCOUNTER — Ambulatory Visit (INDEPENDENT_AMBULATORY_CARE_PROVIDER_SITE_OTHER): Payer: BC Managed Care – PPO | Admitting: Sports Medicine

## 2011-09-20 VITALS — BP 123/70 | HR 89 | Ht 60.25 in

## 2011-09-20 DIAGNOSIS — M2140 Flat foot [pes planus] (acquired), unspecified foot: Secondary | ICD-10-CM

## 2011-09-20 DIAGNOSIS — M79672 Pain in left foot: Secondary | ICD-10-CM | POA: Insufficient documentation

## 2011-09-20 DIAGNOSIS — M79671 Pain in right foot: Secondary | ICD-10-CM | POA: Insufficient documentation

## 2011-09-20 DIAGNOSIS — M79609 Pain in unspecified limb: Secondary | ICD-10-CM

## 2011-09-20 NOTE — Assessment & Plan Note (Signed)
Orthotics made today and patient did have significant improvement with her gait. Patient still has some supination of the left side but did not want overcorrect too much today. Patient does return in 2 weeks we can consider doing a fifth ray post help with that supination. Patient was told to increase the duration and frequency of orthotic use over the next 2 weeks. Patient can come in anytime to have further corrections done.

## 2011-09-20 NOTE — Patient Instructions (Signed)
Patient given verbal instructions 

## 2011-09-20 NOTE — Progress Notes (Signed)
Patient is here for orthotics. Patient has had bilateral foot pain for quite some time. This pain that she's had in her feet does cause some malalignment overall. This likely is contributing to some of her knee as well as hip pain. Patient does have what appears to be a chronic pain type syndrome overall. Patient is also here for manipulation therapy. Patient has been seen for this for multiple months at this time. Patient has chronic pain syndrome and does respond well to orthopedic manipulation. Patient states that she is having pain mostly in the thoracic as well as lumbar vertebrae. Patient is also having some sacral pain on the left side. Denies radiation, swelling, weakness in the extremities.  Review of systems is unremarkable and as stated in history of present illness.   Physical Exam Vitals reviewed General appearance: alert and cooperative, obese Extremities: extremities normal, atraumatic, no cyanosis or edema. Pulses: 2+ and symmetric OMT Findings: Cervical: C2 F RS left Thoracic: T4 extended rotated and side bent right Lumbar: L2 rotated and side bent left Sacrum: Left on left Left ileum posteriorly rotated. Foot exam: Patient does have severe pes planus bilaterally. Patient also has medial bunions bilaterally on the great toes. In addition this patient does have breakdown of the transverse arch bilaterally. On the right foot patient does have hammertoes of the second and third toes. With angulation patient does have severe supination bilaterally right greater than left.  Patient was fitted for a : standard, cushioned, semi-rigid orthotic. The orthotic was heated and afterward the patient stood on the orthotic blank positioned on the orthotic stand. The patient was positioned in subtalar neutral position and 10 degrees of ankle dorsiflexion in a weight bearing stance. After completion of molding, a stable base was applied to the orthotic blank. The blank was ground to a stable  position for weight bearing. Size:6 Base:EVA Posting:first ray post bilateral and small metatarsal pads.  Additional orthotic padding: heel wedge bilateral.   Patient was able to ambulate and much more neutral position. Patient supination improved significantly with still some minimal on the right side.

## 2011-09-26 ENCOUNTER — Ambulatory Visit (INDEPENDENT_AMBULATORY_CARE_PROVIDER_SITE_OTHER): Payer: BC Managed Care – PPO | Admitting: Family Medicine

## 2011-09-26 ENCOUNTER — Ambulatory Visit
Admission: RE | Admit: 2011-09-26 | Discharge: 2011-09-26 | Disposition: A | Discharge status: Home / Self Care | Payer: BC Managed Care – PPO | Source: Ambulatory Visit | Attending: Sports Medicine | Admitting: Sports Medicine

## 2011-09-26 VITALS — BP 128/86 | Ht 60.0 in | Wt 215.0 lb

## 2011-09-26 DIAGNOSIS — M67919 Unspecified disorder of synovium and tendon, unspecified shoulder: Secondary | ICD-10-CM

## 2011-09-26 DIAGNOSIS — M25511 Pain in right shoulder: Secondary | ICD-10-CM

## 2011-09-26 DIAGNOSIS — M719 Bursopathy, unspecified: Secondary | ICD-10-CM

## 2011-09-26 DIAGNOSIS — M25519 Pain in unspecified shoulder: Secondary | ICD-10-CM

## 2011-09-26 MED ORDER — DICLOFENAC SODIUM 1 % TD GEL: 2.0000 g | Freq: Two times a day (BID) | TRANSDERMAL | Status: DC

## 2011-09-26 NOTE — Progress Notes (Signed)
Patient is here for follow up of shoulder pain Right sided started 3 weeks ago when she slipped and caught herself with her right arm on a handle.  Since that time, worse with certain movements such as bending it up above her head in flexion, and other movements, has woke her up at night as well. Patient denies any radiation of the pain, but does state the arm is weaker and has been doing much more with her left arm.  Patient says it even hurts to shake hands.  Patient has not injured shoulder before.  No fever or chills or swelling of arm on ROS.   Physical Exam: Gen: NAD Shoulder: Inspection reveals no abnormalities, atrophy or asymmetry. Palpation is normal with minimal tenderness over AC joint and moderate pain in bicipital groove. ROM is restricted in flexion and abduction AROM, full PROM Rotator cuff strength 3/5 with + painful arch and drop arm. + Neer and Hawkin's tests, empty can. Speeds and Yergason's tests POSITIVE. No labral pathology noted with negative Obrien's, negative clunk and good stability. Normal scapular function observed.  Ultrasound done and interpreted by me today.  Patient did have significant hypoechoic changes of the bicep tendon in trans and long near insertion and full tendon could not be visualized due to patient habitus and edema in area. AC joint has positive mushroom sign with spurring. Patient does have what appears to be hypoechoic changes of the subscapularis tendon as well. Also trouble seeing complete tendon. Supraspinatus tendon visualized but does also have hypoechoic changes.  Infraspinatus visualized.

## 2011-09-26 NOTE — Patient Instructions (Signed)
I think you may have a tear in your bicep tendon as well as potential your rotator cuff.  I will give you voltaren gel to help.  Use it 2 times a day on the shoulder.  Ice 20 minutes 3 times a day.  I will give you some mild exercises to do to keep range of motion but do not use weights.  I am getting a shoulder x-ray today  We will get a MRI as well.  I want to get to see again after you have the MRI.

## 2011-09-26 NOTE — Assessment & Plan Note (Signed)
Patient clinical exam and ultrasound makes me concern for rotator cuff pathology.  X-rays were ordered and reviewed by me, does have type 2 acrominon and is high riding humeral head otherwise unremarkable. Patient has likely tear and due to weakness may be full thickness.  Physical exam also show a likely bicep tendon injury as well.  Patient given voltaren gel with her not wanting oral medications if possible.  Pt given ROM exercises but told no lifting greater than 10 pounds for now.  Will follow up after MRI to better visualize rotator cuff. After MRI will discuss tx options

## 2011-09-27 NOTE — Addendum Note (Signed)
Addended by: Annita Brod on: 09/27/2011 02:42 PM   Modules accepted: Orders

## 2011-09-29 ENCOUNTER — Ambulatory Visit
Admission: RE | Admit: 2011-09-29 | Discharge: 2011-09-29 | Disposition: A | Discharge status: Home / Self Care | Payer: BC Managed Care – PPO | Source: Ambulatory Visit | Attending: Sports Medicine | Admitting: Sports Medicine

## 2011-09-29 DIAGNOSIS — M67919 Unspecified disorder of synovium and tendon, unspecified shoulder: Secondary | ICD-10-CM

## 2011-09-29 DIAGNOSIS — M25511 Pain in right shoulder: Secondary | ICD-10-CM

## 2011-09-30 ENCOUNTER — Other Ambulatory Visit: Payer: BC Managed Care – PPO

## 2011-10-02 ENCOUNTER — Other Ambulatory Visit: Payer: BC Managed Care – PPO

## 2011-10-02 ENCOUNTER — Encounter: Payer: Self-pay | Admitting: *Deleted

## 2011-10-02 NOTE — Patient Instructions (Signed)
APPT WITH DR CHANDLER [facility] AT 10:30AM [address] [place]  [phone] PT AWARE OF APPT TIME AND DATE

## 2011-10-04 ENCOUNTER — Encounter (HOSPITAL_BASED_OUTPATIENT_CLINIC_OR_DEPARTMENT_OTHER): Payer: Self-pay | Admitting: *Deleted

## 2011-10-04 NOTE — Progress Notes (Signed)
Pt had severe pain in her legs after PAS hose after her hernia surgery She has lots of food allergies, or dislikes-will bring her own food She uses a cpap-told her to pack an overnight bag in case anesthesia wants her to stay post op-bring cpap No meds other than supplements.

## 2011-10-05 ENCOUNTER — Other Ambulatory Visit: Payer: Self-pay | Admitting: Orthopedic Surgery

## 2011-10-09 ENCOUNTER — Encounter (HOSPITAL_BASED_OUTPATIENT_CLINIC_OR_DEPARTMENT_OTHER): Payer: Self-pay | Admitting: Anesthesiology

## 2011-10-09 ENCOUNTER — Ambulatory Visit (HOSPITAL_BASED_OUTPATIENT_CLINIC_OR_DEPARTMENT_OTHER)
Admission: RE | Admit: 2011-10-09 | Discharge: 2011-10-09 | Disposition: A | Discharge status: Home / Self Care | Payer: BC Managed Care – PPO | Source: Ambulatory Visit | Attending: Orthopedic Surgery | Admitting: Orthopedic Surgery

## 2011-10-09 ENCOUNTER — Encounter (HOSPITAL_BASED_OUTPATIENT_CLINIC_OR_DEPARTMENT_OTHER): Payer: Self-pay

## 2011-10-09 ENCOUNTER — Ambulatory Visit (HOSPITAL_BASED_OUTPATIENT_CLINIC_OR_DEPARTMENT_OTHER): Payer: BC Managed Care – PPO | Admitting: Anesthesiology

## 2011-10-09 ENCOUNTER — Encounter (HOSPITAL_BASED_OUTPATIENT_CLINIC_OR_DEPARTMENT_OTHER)
Admission: RE | Disposition: A | Discharge status: Home / Self Care | Payer: Self-pay | Source: Ambulatory Visit | Attending: Orthopedic Surgery

## 2011-10-09 DIAGNOSIS — I10 Essential (primary) hypertension: Secondary | ICD-10-CM | POA: Insufficient documentation

## 2011-10-09 DIAGNOSIS — S43429A Sprain of unspecified rotator cuff capsule, initial encounter: Secondary | ICD-10-CM | POA: Insufficient documentation

## 2011-10-09 DIAGNOSIS — W19XXXA Unspecified fall, initial encounter: Secondary | ICD-10-CM | POA: Insufficient documentation

## 2011-10-09 DIAGNOSIS — M67919 Unspecified disorder of synovium and tendon, unspecified shoulder: Secondary | ICD-10-CM

## 2011-10-09 HISTORY — PX: SHOULDER ARTHROSCOPY W/ ROTATOR CUFF REPAIR: SHX2400

## 2011-10-09 LAB — POCT I-STAT, CHEM 8
Creatinine, Ser: 0.7 mg/dL (ref 0.50–1.10)
Hemoglobin: 15.6 g/dL — ABNORMAL HIGH (ref 12.0–15.0)
Potassium: 4.8 mEq/L (ref 3.5–5.1)
Sodium: 145 mEq/L (ref 135–145)

## 2011-10-09 SURGERY — ARTHROSCOPY, SHOULDER, WITH ROTATOR CUFF REPAIR
Anesthesia: General | Site: Shoulder | Laterality: Right | Wound class: Clean

## 2011-10-09 MED ORDER — LIDOCAINE HCL 4 % MT SOLN
OROMUCOSAL | Status: DC | PRN
  Administered 2011-10-09: 4 mL via TOPICAL

## 2011-10-09 MED ORDER — PROPOFOL 10 MG/ML IV BOLUS
INTRAVENOUS | Status: DC | PRN
  Administered 2011-10-09: 170 mg via INTRAVENOUS

## 2011-10-09 MED ORDER — LACTATED RINGERS IV SOLN
INTRAVENOUS | Status: DC
  Administered 2011-10-09 (×2): via INTRAVENOUS

## 2011-10-09 MED ORDER — OXYCODONE-ACETAMINOPHEN 5-325 MG PO TABS: 1.0000 | ORAL_TABLET | ORAL | Status: DC | PRN

## 2011-10-09 MED ORDER — HYDROMORPHONE HCL PF 1 MG/ML IJ SOLN: 0.2500 mg | INTRAMUSCULAR | Status: DC | PRN

## 2011-10-09 MED ORDER — FENTANYL CITRATE 0.05 MG/ML IJ SOLN
50.0000 ug | INTRAMUSCULAR | Status: DC | PRN
  Administered 2011-10-09: 50 ug via INTRAVENOUS

## 2011-10-09 MED ORDER — ONDANSETRON HCL 4 MG/2ML IJ SOLN: 4.0000 mg | Freq: Four times a day (QID) | INTRAMUSCULAR | Status: DC | PRN

## 2011-10-09 MED ORDER — CEFAZOLIN SODIUM-DEXTROSE 2-3 GM-% IV SOLR
INTRAVENOUS | Status: DC | PRN
  Administered 2011-10-09: 2 g via INTRAVENOUS

## 2011-10-09 MED ORDER — ONDANSETRON HCL 4 MG/2ML IJ SOLN
INTRAMUSCULAR | Status: DC | PRN
  Administered 2011-10-09: 4 mg via INTRAVENOUS

## 2011-10-09 MED ORDER — SUCCINYLCHOLINE CHLORIDE 20 MG/ML IJ SOLN
INTRAMUSCULAR | Status: DC | PRN
  Administered 2011-10-09: 100 mg via INTRAVENOUS

## 2011-10-09 MED ORDER — MIDAZOLAM HCL 2 MG/2ML IJ SOLN
1.0000 mg | INTRAMUSCULAR | Status: DC | PRN
  Administered 2011-10-09: 1 mg via INTRAVENOUS

## 2011-10-09 MED ORDER — BUPIVACAINE-EPINEPHRINE PF 0.5-1:200000 % IJ SOLN
INTRAMUSCULAR | Status: DC | PRN
  Administered 2011-10-09: 30 mL

## 2011-10-09 MED ORDER — SODIUM CHLORIDE 0.9 % IR SOLN
Status: DC | PRN
  Administered 2011-10-09: 3000 mL

## 2011-10-09 MED ORDER — LIDOCAINE HCL (CARDIAC) 20 MG/ML IV SOLN
INTRAVENOUS | Status: DC | PRN
  Administered 2011-10-09: 70 mg via INTRAVENOUS

## 2011-10-09 MED ORDER — FENTANYL CITRATE 0.05 MG/ML IJ SOLN
INTRAMUSCULAR | Status: DC | PRN
  Administered 2011-10-09: 50 ug via INTRAVENOUS
  Administered 2011-10-09 (×2): 25 ug via INTRAVENOUS

## 2011-10-09 MED ORDER — DEXAMETHASONE SODIUM PHOSPHATE 4 MG/ML IJ SOLN
INTRAMUSCULAR | Status: DC | PRN
  Administered 2011-10-09: 10 mg via INTRAVENOUS

## 2011-10-09 SURGICAL SUPPLY — 90 items
ADH SKN CLS APL DERMABOND .7 (GAUZE/BANDAGES/DRESSINGS)
ANCH SUT SWLK 19.1X4.75 (Anchor) ×2 IMPLANT
ANCHOR SUT BIO SW 4.75 W/FIB (Anchor) ×6 IMPLANT
ANCHOR SUT BIO SW 4.75X19.1 (Anchor) ×4 IMPLANT
APL SKNCLS STERI-STRIP NONHPOA (GAUZE/BANDAGES/DRESSINGS)
BENZOIN TINCTURE PRP APPL 2/3 (GAUZE/BANDAGES/DRESSINGS) IMPLANT
BLADE 4.2CUDA (BLADE) IMPLANT
BLADE CUDA 5.5 (BLADE) IMPLANT
BLADE CUTTER GATOR 3.5 (BLADE) IMPLANT
BLADE GREAT WHITE 4.2 (BLADE) IMPLANT
BLADE SURG 15 STRL LF DISP TIS (BLADE) IMPLANT
BLADE SURG 15 STRL SS (BLADE)
BLADE SURG ROTATE 9660 (MISCELLANEOUS) IMPLANT
BUR 3.5 LG SPHERICAL (BURR) IMPLANT
BUR OVAL 4.0 (BURR) ×2 IMPLANT
BUR OVAL 6.0 (BURR) IMPLANT
BURR 3.5 LG SPHERICAL (BURR)
CANISTER OMNI JUG 16 LITER (MISCELLANEOUS) ×2 IMPLANT
CANISTER SUCTION 2500CC (MISCELLANEOUS) IMPLANT
CANNULA 5.75X71 LONG (CANNULA) ×2 IMPLANT
CANNULA TWIST IN 8.25X7CM (CANNULA) ×2 IMPLANT
CHLORAPREP W/TINT 26ML (MISCELLANEOUS) ×2 IMPLANT
CLOTH BEACON ORANGE TIMEOUT ST (SAFETY) ×2 IMPLANT
DECANTER SPIKE VIAL GLASS SM (MISCELLANEOUS) IMPLANT
DERMABOND ADVANCED (GAUZE/BANDAGES/DRESSINGS)
DERMABOND ADVANCED .7 DNX12 (GAUZE/BANDAGES/DRESSINGS) IMPLANT
DRAPE INCISE IOBAN 66X45 STRL (DRAPES) ×2 IMPLANT
DRAPE STERI 35X30 U-POUCH (DRAPES) ×2 IMPLANT
DRAPE SURG 17X23 STRL (DRAPES) ×2 IMPLANT
DRAPE U 20/CS (DRAPES) ×2 IMPLANT
DRAPE U-SHAPE 47X51 STRL (DRAPES) ×2 IMPLANT
DRAPE U-SHAPE 76X120 STRL (DRAPES) ×4 IMPLANT
DRSG PAD ABDOMINAL 8X10 ST (GAUZE/BANDAGES/DRESSINGS) ×2 IMPLANT
ELECT REM PT RETURN 9FT ADLT (ELECTROSURGICAL) ×2
ELECTRODE REM PT RTRN 9FT ADLT (ELECTROSURGICAL) ×1 IMPLANT
GAUZE SPONGE 4X4 16PLY XRAY LF (GAUZE/BANDAGES/DRESSINGS) IMPLANT
GAUZE XEROFORM 1X8 LF (GAUZE/BANDAGES/DRESSINGS) ×2 IMPLANT
GLOVE BIO SURGEON STRL SZ 6.5 (GLOVE) ×4 IMPLANT
GLOVE BIO SURGEON STRL SZ7.5 (GLOVE) ×2 IMPLANT
GLOVE BIOGEL PI IND STRL 7.0 (GLOVE) ×2 IMPLANT
GLOVE BIOGEL PI IND STRL 8 (GLOVE) ×1 IMPLANT
GLOVE BIOGEL PI INDICATOR 7.0 (GLOVE) ×2
GLOVE BIOGEL PI INDICATOR 8 (GLOVE) ×1
GOWN PREVENTION PLUS XLARGE (GOWN DISPOSABLE) ×6 IMPLANT
NDL SUT 6 .5 CRC .975X.05 MAYO (NEEDLE) IMPLANT
NEEDLE 1/2 CIR CATGUT .05X1.09 (NEEDLE) IMPLANT
NEEDLE MAYO TAPER (NEEDLE)
NEEDLE SCORPION MULTI FIRE (NEEDLE) ×2 IMPLANT
NS IRRIG 1000ML POUR BTL (IV SOLUTION) IMPLANT
PACK ARTHROSCOPY DSU (CUSTOM PROCEDURE TRAY) ×2 IMPLANT
PACK BASIN DAY SURGERY FS (CUSTOM PROCEDURE TRAY) ×2 IMPLANT
PENCIL BUTTON HOLSTER BLD 10FT (ELECTRODE) IMPLANT
RESECTOR FULL RADIUS 4.2MM (BLADE) ×2 IMPLANT
RESECTOR FULL RADIUS 4.8MM (BLADE) IMPLANT
SHEET MEDIUM DRAPE 40X70 STRL (DRAPES) ×2 IMPLANT
SLEEVE SCD COMPRESS KNEE MED (MISCELLANEOUS) IMPLANT
SLING ARM FOAM STRAP LRG (SOFTGOODS) IMPLANT
SLING ARM FOAM STRAP MED (SOFTGOODS) IMPLANT
SLING ARM FOAM STRAP XLG (SOFTGOODS) IMPLANT
SLING ARM IMMOBILIZER MED (SOFTGOODS) IMPLANT
SPONGE GAUZE 4X4 12PLY (GAUZE/BANDAGES/DRESSINGS) ×2 IMPLANT
SPONGE LAP 4X18 X RAY DECT (DISPOSABLE) IMPLANT
STRIP CLOSURE SKIN 1/2X4 (GAUZE/BANDAGES/DRESSINGS) IMPLANT
SUCTION FRAZIER TIP 10 FR DISP (SUCTIONS) IMPLANT
SUPPORT WRAP ARM LG (MISCELLANEOUS) ×2 IMPLANT
SUT 2 FIBERLOOP 20 STRT BLUE (SUTURE)
SUT BONE WAX W31G (SUTURE) IMPLANT
SUT ETHIBOND 2 OS 4 DA (SUTURE) IMPLANT
SUT ETHILON 3 0 PS 1 (SUTURE) ×2 IMPLANT
SUT ETHILON 4 0 PS 2 18 (SUTURE) ×2 IMPLANT
SUT FIBERWIRE #2 38 T-5 BLUE (SUTURE)
SUT FIBERWIRE 2-0 18 17.9 3/8 (SUTURE)
SUT MNCRL AB 3-0 PS2 18 (SUTURE) IMPLANT
SUT MNCRL AB 4-0 PS2 18 (SUTURE) IMPLANT
SUT PDS AB 0 CT 36 (SUTURE) IMPLANT
SUT PROLENE 3 0 PS 2 (SUTURE) ×2 IMPLANT
SUT VIC AB 0 CT1 18XCR BRD 8 (SUTURE) IMPLANT
SUT VIC AB 0 CT1 8-18 (SUTURE)
SUT VIC AB 2-0 SH 18 (SUTURE) IMPLANT
SUTURE 2 FIBERLOOP 20 STRT BLU (SUTURE) IMPLANT
SUTURE FIBERWR #2 38 T-5 BLUE (SUTURE) IMPLANT
SUTURE FIBERWR 2-0 18 17.9 3/8 (SUTURE) IMPLANT
SYR BULB 3OZ (MISCELLANEOUS) IMPLANT
TOWEL OR 17X24 6PK STRL BLUE (TOWEL DISPOSABLE) ×4 IMPLANT
TOWEL OR NON WOVEN STRL DISP B (DISPOSABLE) ×2 IMPLANT
TUBE CONNECTING 20X1/4 (TUBING) ×2 IMPLANT
TUBING ARTHROSCOPY IRRIG 16FT (MISCELLANEOUS) ×2 IMPLANT
WAND STAR VAC 90 (SURGICAL WAND) ×2 IMPLANT
WATER STERILE IRR 1000ML POUR (IV SOLUTION) ×2 IMPLANT
YANKAUER SUCT BULB TIP NO VENT (SUCTIONS) IMPLANT

## 2011-10-09 NOTE — Anesthesia Postprocedure Evaluation (Signed)
Anesthesia Post Note  Patient: Kerry [name]  Procedure(s) Performed: Procedure(s) (LRB): SHOULDER ARTHROSCOPY WITH ROTATOR CUFF REPAIR (Right)  Anesthesia type: General  Patient location: PACU  Post pain: Pain level controlled and Adequate analgesia  Post assessment: Post-op Vital signs reviewed, Patient's Cardiovascular Status Stable, Respiratory Function Stable, Patent Airway and Pain level controlled  Last Vitals:  Filed Vitals:   10/09/11 1503  BP:   Pulse: 71  Temp:   Resp: 18    Post vital signs: Reviewed and stable  Level of consciousness: awake, alert  and oriented  Complications: No apparent anesthesia complications

## 2011-10-09 NOTE — H&P (Signed)
Kerry [name] is an 61 y.o. female.   Chief Complaint: R shoulder pain and weakness  HPI: s/p fall 1 mo ago with significant pain and weakness in R arm.  MRI revealed large RCT.   Past Medical History  Diagnosis Date  . Allergic rhinitis, cause unspecified   . Anxiety   . Depression   . Chronic pain   . GERD (gastroesophageal reflux disease)   . HTN (hypertension)   . Sleep apnea   . Migraines     Past Surgical History  Procedure Date  . Umbilical hernia repair 2010  . Carpal tunnel release 1998  . Dental implants 2010  . Esophageal dilation 2010  . Hammer toe surgery     rt  . Septoplasty     Family History  Problem Relation Age of Onset  . Rheumatologic disease Mother   . Hypertension Mother   . Hypertension Father   . Rheumatologic disease Sister   . Arthritis Sister    Social History:  reports that she has never smoked. She does not have any smokeless tobacco history on file. She reports that she does not drink alcohol or use illicit drugs.  Allergies:  Allergies  Allergen Reactions  . Cefixime Swelling  . Celebrex (Celecoxib) Swelling  . Levofloxacin Other (See Comments)    Muscle and joint pain  . Penicillins Hives  . Tetracyclines & Related Rash    Medications Prior to Admission  Medication Sig Dispense Refill  . Ascorbic Acid (VITAMIN C) 500 MG CAPS Take 500 mg by mouth daily.  60 capsule  0  . cyanocobalamin (,VITAMIN B-12,) 1000 MCG/ML injection Do an injection weekly for the first 4 weeks then monthly thereafter.  10 mL  1  . ergocalciferol (DRISDOL) 8000 UNIT/ML drops Take 4,000 Units by mouth daily.      Boris Lown Oil CAPS Take by mouth.      . diclofenac sodium (VOLTAREN) 1 % GEL Apply 2 g topically 2 (two) times daily.  100 g  1  . Needles & Syringes MISC 1 Can by Does not apply route once a week. Dispense meals for B12 IM injections please thank you  10 each  1    No results found for this or any previous visit (from the past 48 hour(s)). No  results found.  Review of Systems  All other systems reviewed and are negative.    Blood pressure 167/88, pulse 79, temperature 98.3 F (36.8 C), temperature source Oral, resp. rate 18, height 5' 0.25" (1.53 m), weight 212 lb 8 oz (96.389 kg), SpO2 98.00%. Physical Exam  Constitutional: She is oriented to person, place, and time. She appears well-developed and well-nourished.  HENT:  Head: Atraumatic.  Eyes: EOM are normal.  Cardiovascular: Intact distal pulses.   Respiratory: Effort normal.  Musculoskeletal:       Right shoulder: She exhibits decreased range of motion, tenderness and decreased strength.  Neurological: She is alert and oriented to person, place, and time.  Skin: Skin is warm and dry.  Psychiatric: She has a normal mood and affect.     Assessment/Plan s/p fall 1 mo ago with significant pain and weakness in R arm.  MRI revealed large RCT. Plan arthro R RCR/SAD Risks / benefits of surgery discussed Consent on chart  NPO for OR Preop antibiotics   Emiko Osorto WILLIAM 10/09/2011, 12:04 PM

## 2011-10-09 NOTE — Op Note (Signed)
Procedure(s): SHOULDER ARTHROSCOPY WITH ROTATOR CUFF REPAIR Procedure Note  Kerry [name] female 61 y.o. 10/09/2011  Procedure(s) and Anesthesia Type:    *RIGHT SHOULDER ARTHROSCOPY WITH ROTATOR CUFF REPAIR and ACROMIOPLASTY- General Right shoulder arthroscopic Long head biceps tenotomy with debridement of superior labrum.  Surgeon(s) and Role:    * Mable Paris, MD - Primary     Surgeon: Mable Paris   Assistants: Damita Lack PA-C [facility] was scrubbed throughout the procedure and was essential in positioning and holding the camera throughout the procedure as well as closure)  Anesthesia: General endotracheal anesthesia with preoperative interscalene block     Procedure Detail  SHOULDER ARTHROSCOPY WITH ROTATOR CUFF REPAIR  Estimated Blood Loss: Min         Drains: none  Blood Given: none         Specimens: none        Complications:  * No complications entered in OR log *         Disposition: PACU - hemodynamically stable.         Condition: stable    Procedure:   INDICATIONS FOR SURGERY: The patient is 61 y.o. female who had a fall approximately one month ago with subsequent pain and dysfunction in the right shoulder. MRI revealed  a large rotator cuff tear. She was indicated for operative treatment to restore function and decrease pain.  OPERATIVE FINDINGS: Examination under anesthesia: No stiffness or Diagnostic Arthroscopy:  Glenoid articular cartilage: Intact Humeral head articular cartilage: Intact Labrum: Intact, the long head biceps tendon had significant partial tearing involving greater than 50% of the tendon and therefore was tenotomized. Loose bodies: None Synovitis: Mild rotator cuff: Large non-retracted rotator cuff tear involving the entire supraspinatus and infraspinatus tendon  Coracoacromial ligament: Severely frayed with large underlying acromial spur  DESCRIPTION OF PROCEDURE: The patient was identified in  preoperative  holding area where I personally marked the operative site after  verifying site, side, and procedure with the patient. An interscalene block was given by the attending anesthesiologist the holding area.  The patient was taken back to the operating room where general anesthesia was induced without complication and was placed in the beach-chair position with the back  elevated about 60 degrees and all extremities and head and neck carefully padded and  positioned.   The right upper extremity was then prepped and  draped in a standard sterile fashion. The appropriate time-out  procedure was carried out. The patient did receive IV antibiotics  within 30 minutes of incision.   A small posterior portal incision was made and the arthroscope was introduced into the joint. An anterior portal was then established above the subscapularis using needle localization. Small cannula was placed anteriorly. Diagnostic arthroscopy was then carried out with findings as described above.  The biceps tendon was carefully examined and pulled into the joint. Had significant partial thickness tearing. Therefore a arthroscopic biceps tenotomy was carried out through an anterior intra-articular portal. The superior labrum was then debrided. The remainder the joint looked pretty good except for the obvious significant rotator cuff tear which involved the entire supraspinatus and infraspinatus without significant retraction.  The arthroscope was then introduced into the subacromial space a standard lateral portal was established with needle localization. The shaver was used through the lateral portal to perform extensive bursectomy. Coracoacromial ligament was examined and found to be severely frayed..  An extensive bursectomy was carried out and the leading edge of the torn rotator cuff was debrided  back to healthy appearing tissue. Overall the tissue quality appeared excellent and there was not significant  retraction. The tendon easily reduced back to the tuberosity. The tuberosity was cleared of all residual tendon and scar tissue and then a bur was used to bur down to bleeding bone to promote healing on the tuberosity. The lateral portal was made as well as a posterior lateral accessory portal for the camera. Anterior portal was made the subacromial space. The repair was then carried out from posterior to anterior using 3 x 4.75 peak swivel lock anchors in the medial row passing 6 strands of independently fixed fiber tape in about 1 cm medial to the Cuff edge. These were evenly spaced. There were then each brought over to 2x 4.75 swivel lock anchors in a lateral row bringing every other suture to the anterior or posterior anchor. This strap the tendon down nicely over the prepared tuberosity. The final repair was viewed from posterior lateral portals and felt to be excellent.  The coracoacromial ligament was taken down off the anterior acromion with the ArthroCare exposing a large craggy anterior acromial spur. A high-speed bur was then used through the lateral portal to take down the anterior acromial spur from lateral to medial in a standard acromioplasty.  The acromioplasty was also viewed from the lateral portal and the bur was used as necessary to ensure that the acromion was completely flat from posterior to anterior.   The arthroscopic equipment was removed from the joint and the portals were closed with 3-0 nylon in an interrupted fashion. Sterile dressings were then applied including Xeroform 4 x 4's ABDs and tape. The patient was then allowed to awaken from general anesthesia, placed in a sling, transferred to the stretcher and taken to the recovery room in stable condition.   POSTOPERATIVE PLAN: The patient will be discharged home today and will followup in one week for suture removal and wound check.  She will follow the large cuff tear protocol.

## 2011-10-09 NOTE — Anesthesia Preprocedure Evaluation (Signed)
Anesthesia Evaluation  Patient identified by MRN, date of birth, ID band Patient awake    Reviewed: Allergy & Precautions, H&P , NPO status , Patient's Chart, lab work & pertinent test results  Airway Mallampati: II  Neck ROM: full    Dental   Pulmonary sleep apnea ,          Cardiovascular hypertension,     Neuro/Psych  Headaches, Anxiety Depression  Neuromuscular disease    GI/Hepatic GERD-  ,  Endo/Other  Morbid obesity  Renal/GU      Musculoskeletal   Abdominal   Peds  Hematology   Anesthesia Other Findings   Reproductive/Obstetrics                           Anesthesia Physical Anesthesia Plan  ASA: III  Anesthesia Plan: General and Regional   Post-op Pain Management: MAC Combined w/ Regional for Post-op pain   Induction: Intravenous  Airway Management Planned: Oral ETT  Additional Equipment:   Intra-op Plan:   Post-operative Plan: Extubation in OR  Informed Consent: I have reviewed the patients History and Physical, chart, labs and discussed the procedure including the risks, benefits and alternatives for the proposed anesthesia with the patient or authorized representative who has indicated his/her understanding and acceptance.     Plan Discussed with: CRNA and Surgeon  Anesthesia Plan Comments:         Anesthesia Quick Evaluation

## 2011-10-09 NOTE — Anesthesia Procedure Notes (Addendum)
Anesthesia Regional Block:  Interscalene brachial plexus block  Pre-Anesthetic Checklist: ,, timeout performed, Correct Patient, Correct Site, Correct Laterality, Correct Procedure, Correct Position, site marked, Risks and benefits discussed,  Surgical consent,  Pre-op evaluation,  At surgeon's request and post-op pain management  Laterality: Right  Prep: chloraprep       Needles:  Injection technique: Single-shot  Needle Type: Echogenic Stimulator Needle     Needle Length: 5cm 5 cm Needle Gauge: 22 and 22 G    Additional Needles:  Procedures: ultrasound guided and nerve stimulator Interscalene brachial plexus block  Nerve Stimulator or Paresthesia:  Response: biceps flexion, 0.45 mA,   Additional Responses:   Narrative:  Start time: 10/09/2011 12:28 PM End time: 10/09/2011 12:38 PM Injection made incrementally with aspirations every 5 mL.  Performed by: Personally  Anesthesiologist: Dr Chaney Malling  Additional Notes: Functioning IV was confirmed and monitors were applied.  A 50mm 22ga Arrow echogenic stimulator needle was used. Sterile prep and drape,hand hygiene and sterile gloves were used.  Negative aspiration and negative test dose prior to incremental administration of local anesthetic. The patient tolerated the procedure well.  Ultrasound guidance: relevent anatomy identified, needle position confirmed, local anesthetic spread visualized around nerve(s), vascular puncture avoided.  Image printed for medical record.   Interscalene brachial plexus block Procedure Name: Intubation Date/Time: 10/09/2011 12:47 PM Performed by: Burna Cash Pre-anesthesia Checklist: Patient identified, Emergency Drugs available, Suction available and Patient being monitored Patient Re-evaluated:Patient Re-evaluated prior to inductionOxygen Delivery Method: Circle System Utilized Preoxygenation: Pre-oxygenation with 100% oxygen Intubation Type: IV induction Ventilation: Mask ventilation  without difficulty Laryngoscope Size: Mac and 3 Grade View: Grade I Tube type: Oral Number of attempts: 1 Airway Equipment and Method: stylet and oral airway Placement Confirmation: ETT inserted through vocal cords under direct vision,  positive ETCO2 and breath sounds checked- equal and bilateral Secured at: 20 cm Tube secured with: Tape Dental Injury: Teeth and Oropharynx as per pre-operative assessment

## 2011-10-09 NOTE — Progress Notes (Signed)
Assisted Dr. Hodierne with right, ultrasound guided, interscalene  block. Side rails up, monitors on throughout procedure. See vital signs in flow sheet. Tolerated Procedure well. 

## 2011-10-09 NOTE — Transfer of Care (Signed)
Immediate Anesthesia Transfer of Care Note  Patient: Kerry [name]  Procedure(s) Performed: Procedure(s) (LRB) with comments: SHOULDER ARTHROSCOPY WITH ROTATOR CUFF REPAIR (Right) - right shoulder arthroscopy with subacromial decompression and rotator cuff repair  Patient Location: PACU  Anesthesia Type: GA combined with regional for post-op pain  Level of Consciousness: sedated  Airway & Oxygen Therapy: Patient Spontanous Breathing and Patient connected to face mask oxygen  Post-op Assessment: Report given to PACU RN and Post -op Vital signs reviewed and stable  Post vital signs: Reviewed and stable  Complications: No apparent anesthesia complications

## 2011-11-27 ENCOUNTER — Other Ambulatory Visit: Payer: Self-pay | Admitting: *Deleted

## 2011-11-27 DIAGNOSIS — M25569 Pain in unspecified knee: Secondary | ICD-10-CM

## 2011-11-29 ENCOUNTER — Ambulatory Visit: Payer: BC Managed Care – PPO | Attending: Family Medicine | Admitting: Physical Therapy

## 2011-11-29 DIAGNOSIS — M25569 Pain in unspecified knee: Secondary | ICD-10-CM | POA: Insufficient documentation

## 2011-11-29 DIAGNOSIS — M6281 Muscle weakness (generalized): Secondary | ICD-10-CM | POA: Insufficient documentation

## 2011-11-29 DIAGNOSIS — IMO0001 Reserved for inherently not codable concepts without codable children: Secondary | ICD-10-CM | POA: Insufficient documentation

## 2011-12-05 ENCOUNTER — Ambulatory Visit: Payer: BC Managed Care – PPO | Admitting: Physical Therapy

## 2011-12-08 ENCOUNTER — Encounter: Payer: BC Managed Care – PPO | Admitting: Rehabilitative and Restorative Service Providers"

## 2011-12-12 ENCOUNTER — Ambulatory Visit: Payer: BC Managed Care – PPO | Admitting: Physical Therapy

## 2011-12-18 ENCOUNTER — Encounter: Payer: Self-pay | Admitting: Family Medicine

## 2011-12-19 ENCOUNTER — Ambulatory Visit: Payer: BC Managed Care – PPO | Attending: Family Medicine | Admitting: Physical Therapy

## 2011-12-19 ENCOUNTER — Encounter: Payer: Self-pay | Admitting: Family Medicine

## 2011-12-19 ENCOUNTER — Ambulatory Visit (INDEPENDENT_AMBULATORY_CARE_PROVIDER_SITE_OTHER): Payer: BC Managed Care – PPO | Admitting: Family Medicine

## 2011-12-19 VITALS — BP 154/69 | HR 98 | Ht 60.0 in | Wt 212.0 lb

## 2011-12-19 DIAGNOSIS — M6281 Muscle weakness (generalized): Secondary | ICD-10-CM | POA: Insufficient documentation

## 2011-12-19 DIAGNOSIS — M25519 Pain in unspecified shoulder: Secondary | ICD-10-CM

## 2011-12-19 DIAGNOSIS — M719 Bursopathy, unspecified: Secondary | ICD-10-CM

## 2011-12-19 DIAGNOSIS — M79671 Pain in right foot: Secondary | ICD-10-CM

## 2011-12-19 DIAGNOSIS — IMO0001 Reserved for inherently not codable concepts without codable children: Secondary | ICD-10-CM | POA: Insufficient documentation

## 2011-12-19 DIAGNOSIS — M25569 Pain in unspecified knee: Secondary | ICD-10-CM

## 2011-12-19 DIAGNOSIS — M79609 Pain in unspecified limb: Secondary | ICD-10-CM

## 2011-12-19 DIAGNOSIS — M25561 Pain in right knee: Secondary | ICD-10-CM

## 2011-12-19 DIAGNOSIS — M67919 Unspecified disorder of synovium and tendon, unspecified shoulder: Secondary | ICD-10-CM

## 2011-12-19 DIAGNOSIS — M25512 Pain in left shoulder: Secondary | ICD-10-CM

## 2011-12-19 NOTE — Patient Instructions (Signed)
It is always good to see you. Regarding your right knee we will get x-rays but unfortunately you have to go tomorrow. After we get the x-ray results we will schedule you for the MRI of the right knee. I think he would be a decent idea for you to go to physical therapy for the right knee until the end of the year. Regarding her left shoulder left some weight and see if it improves as she usually right shoulder more. When you are looking for new shoes look for shoes that has a neutral hindfoot, Trandate goes the entire length of the sole of the shoe as well as probably a 4E width meds see you again after the first of the year and we will discuss treatment of the knee in greater detail and we can start manipulation again in 6-8 weeks.

## 2011-12-20 DIAGNOSIS — M25512 Pain in left shoulder: Secondary | ICD-10-CM | POA: Insufficient documentation

## 2011-12-20 NOTE — Assessment & Plan Note (Signed)
Regarding her feet I think it's more secondary to improper setting of shoe wear. Discussed that patient needs a more neutral hindfoot shoe instead of the overpronation her shoes that she has at this time. In addition to this discussed having tred I would be continuous on the bottom of the shoe as well as having a little wider width. Patient will attempt to get new shoes and from there on we'll see if the orthotics make it improvement.

## 2011-12-20 NOTE — Assessment & Plan Note (Signed)
Patient's left shoulder pain is likely secondary to overuse injury washes compensating for her rotator cuff surgery. It seems to be getting better on its own with the patient starting to be able to use her right arm more. Discussed potential anti-inflammatories which patient declined at this time. Discussed that if this seems to get the weakness like the other side that she should see Dr. Ave Filter. Discussed potential ultrasound today but would not change management did not do. We'll continue to monitor.

## 2011-12-20 NOTE — Progress Notes (Signed)
Patient returns today with multiple orthopedic problems.  1. Patient did have rotator cuff surgery on the right side back in September of 2013. Patient has been doing rehabilitation and has been doing relatively well. Patient unfortunately states that she had some left shoulder pain during her rehabilitation. Patient states it felt somewhat similar to the right side but never had any weakness. Patient denies any radiation denies any numbness in her fingertips. Patient states though that the pain was severe enough that it did seem to wake her up at night. Patient is just somewhat anxious because she does not want it to get to the point of the other side.  2. Patient has been wearing her orthotics and since that time has noticed some improvement in her knee pain. Patient states though unfortunately feels like she is still walking on the outside of her feet which does cause some discomfort. Patient is wondering if any corrections would potentially need to be made. Looking at patient's orthotics yard he has a lateral heel which bilaterally and has good correction otherwise. Patient denies any numbness in the feet and denies any weakness in the legs.  3. Patient states that her right knee pain seems to be getting a little bit worse even though her bilateral knee pain seems to be better with the orthotics as mentioned above. Patient says that the right knee from time to time seems to have a mild catching sensation but never feels like it'll give out on her. Patient denies any radiation. Patient describes the pain as more of a dull achy sensation. Patient has been going to physical therapy on regular basis over the course of the last 4 weeks with minimal benefit. Patient states that the ultrasound on it seemed to be somewhat better. Patient denies that the pain is waking her up at night. Patient denies any swelling at the beginning of days but sometimes at the end of a has some swelling on it.  Patient has had  chronic pain syndrome and has been treated with OMT but did discuss today that I would like patient to be 4 months out from rotator cuff repair before any osteopathic manipulation was done for her specific problems.  Denies fever, chills, nausea vomiting abdominal pain, dysuria, chest pain, shortness of breath dyspnea on exertion or numbness in extremities  Past medical history, social, surgical and family history all reviewed.   Physical exam Blood pressure 154/69, pulse 98, height 5' (1.524 m), weight 212 lb (96.163 kg). General: No apparent distress alert and oriented x3 mood and affect somewhat normal but blunted. Respiratory: Patient's recent full sentences and does not appear short of breath Skin: Warm dry intact with no signs of infection or rash Neuro: Cranial nerves II through XII are intact, neurovascularly intact in all extremities with 2+ DTRs and 2+ pulses. Foot exam: Patient does have severe pes planus bilaterally. Patient also has medial bunions bilaterally on the great toes. In addition this patient does have breakdown of the transverse arch bilaterally. On the right foot patient does have hammertoes of the second and third toes. With ambulationpatient does have supination bilaterally right greater than left but I do think this is secondary to her shoes. Right knee exam: Patient does have falling to motion and does have trace effusion on the knee. She has mild crepitus to range of motion and mild lateral tracking of the patella. Patient is tender along the medial joint line and does have a positive McMurray's test. She is neurovascularly intact  distally with 2+ DTRs.  Left shoulder exam: Patient does have full passive range of motion the patient does have some active restriction in internal rotation. She has positive impingement signs with 5 out of 5 rotator cuff strength. She is neurovascularly intact.

## 2011-12-20 NOTE — Assessment & Plan Note (Signed)
Regarding right knee pain I think it's potential for meniscal tear. No ultrasound was done today but one may be needed in the future. Discussed the potential corticosteroid injection today which patient declined secondary to the side effects of prednisone she has from time to time. At this point we will get x-ray likely get an MRI of his knee because of the mechanical symptoms. Once this is done we will discuss at followup further treatment options.

## 2011-12-22 ENCOUNTER — Ambulatory Visit
Admission: RE | Admit: 2011-12-22 | Discharge: 2011-12-22 | Disposition: A | Discharge status: Home / Self Care | Payer: BC Managed Care – PPO | Source: Ambulatory Visit | Attending: Sports Medicine | Admitting: Sports Medicine

## 2011-12-22 ENCOUNTER — Encounter: Payer: BC Managed Care – PPO | Admitting: Physical Therapy

## 2011-12-22 DIAGNOSIS — M25561 Pain in right knee: Secondary | ICD-10-CM

## 2011-12-25 ENCOUNTER — Telehealth: Payer: Self-pay | Admitting: Family Medicine

## 2011-12-25 NOTE — Telephone Encounter (Signed)
Called patient and told her x-ray results and that an MRI would not be helpful at this juncture. Patient has what appears to be a large loose body in the posterior knee that likely is causing her the mechanical symptoms.  She will discuss this with Dr. Ave Filter and see if arthroscopic procedure would be possible for removal or due to the amount of arthritis would knee replacement be better.   We will cancel the MRI at this time because likely not secondary to meniscal tear.

## 2011-12-26 ENCOUNTER — Ambulatory Visit: Payer: BC Managed Care – PPO | Admitting: Physical Therapy

## 2011-12-26 ENCOUNTER — Ambulatory Visit: Payer: BC Managed Care – PPO | Admitting: Family Medicine

## 2011-12-26 ENCOUNTER — Other Ambulatory Visit: Payer: BC Managed Care – PPO

## 2011-12-27 ENCOUNTER — Encounter: Payer: Self-pay | Admitting: Family Medicine

## 2011-12-29 ENCOUNTER — Encounter: Payer: BC Managed Care – PPO | Admitting: Physical Therapy

## 2012-01-03 ENCOUNTER — Ambulatory Visit: Payer: BC Managed Care – PPO | Admitting: Physical Therapy

## 2012-01-05 ENCOUNTER — Encounter: Payer: BC Managed Care – PPO | Admitting: Physical Therapy

## 2012-01-11 ENCOUNTER — Ambulatory Visit: Payer: BC Managed Care – PPO | Admitting: Physical Therapy

## 2012-01-23 ENCOUNTER — Encounter: Payer: Self-pay | Admitting: Family Medicine

## 2012-01-23 ENCOUNTER — Ambulatory Visit (INDEPENDENT_AMBULATORY_CARE_PROVIDER_SITE_OTHER): Payer: BC Managed Care – PPO | Admitting: Family Medicine

## 2012-01-23 VITALS — BP 170/91 | Ht 60.0 in | Wt 220.0 lb

## 2012-01-23 DIAGNOSIS — M79672 Pain in left foot: Secondary | ICD-10-CM

## 2012-01-23 DIAGNOSIS — M999 Biomechanical lesion, unspecified: Secondary | ICD-10-CM | POA: Insufficient documentation

## 2012-01-23 DIAGNOSIS — M79671 Pain in right foot: Secondary | ICD-10-CM

## 2012-01-23 DIAGNOSIS — M25569 Pain in unspecified knee: Secondary | ICD-10-CM

## 2012-01-23 DIAGNOSIS — M25561 Pain in right knee: Secondary | ICD-10-CM

## 2012-01-23 DIAGNOSIS — M79609 Pain in unspecified limb: Secondary | ICD-10-CM

## 2012-01-23 NOTE — Progress Notes (Signed)
Chief complaint bilateral foot pain, rib pain  History of present illness: Patient is returning with bilateral foot pain. Patient has had custom orthotics secondary to this which has been beneficial. Patient has recently gotten new shoes and wanted this to evaluate issues before changing her orthotics over to them. Patient states that her old shoes seem to be hurting more and she has tried on these issues and did seem to make a significant improvement. Overall patient is not complaining of any new symptoms would just like more of an evaluation.  Regarding her right knee pain. Patient has had this right knee pain for some time and did have x-ray showing a loose body in the posterior aspect of the joint space. Patient has not seen Dr. Ave Filter yet but will be going for further evaluation in the next couple weeks.  Patient is also complaining of some rib pain. Patient states that it has started while she's been increasing the weight with her physical therapy for her recent rotator cuff repair on the right shoulder. Patient states that this has been quite constant with no radiation. Patient states that it seems to hurt with any type of movement and even with her sleeping at night. Patient denies any shortness of breath any recent leg swelling or any recent fevers or chills.  Review of systems: 14 system review is done and unremarkable as related to the orthopedic problem.  Physical exam Blood pressure 170/91, height 5' (1.524 m), weight 220 lb (99.791 kg). General: No apparent distress alert and oriented x3 mood and affect somewhat normal but blunted.  Respiratory: Patient's recent full sentences and does not appear short of breath  Skin: Warm dry intact with no signs of infection or rash  Neuro: Cranial nerves II through XII are intact, neurovascularly intact in all extremities with 2+ DTRs and 2+ pulses.  Foot exam:  Patient does have severe pes planus bilaterally. Patient also has medial bunions  bilaterally on the great toes. In addition this patient does have breakdown of the transverse arch bilaterally. On the right foot patient does have hammertoes of the second and third toes. Patient was placed in new shoes which were 71/2 4E patient walks still in external rotation bilaterally but much more neutral with the feet.  With ambulationpatient does have supination bilaterally right greater than left but I do think this is secondary to her shoes.  Right knee exam: Patient does have falling to motion and does have trace effusion on the knee. She has mild crepitus to range of motion and mild lateral tracking of the patella. Patient is tender along the medial joint line and does have a positive McMurray's test. She is neurovascularly intact distally with 2+ DTRs. Patient does have a slip rib stuck in inhalation at the T9 vertebrae on the right side. After verbal and written consent patient was manipulated with muscle energy with significant improvement in pain.

## 2012-01-23 NOTE — Assessment & Plan Note (Addendum)
Patient does have a posterior loose body in tricompartmental degenerative changes of the right knee. Patient will be following up with orthopedic surgeon, Dr. Ave Filter in the near future.

## 2012-01-23 NOTE — Patient Instructions (Addendum)
You are doing great See Dr. Ave Filter for your shoulder and knee.  I will give him the x-ray picture.  Try stretching your lower back by laying on your back bending your knee and pulling across your body with your contralateral arm. Hold for 10 seconds, relax then repeat 3 times on both sides.  For your upper back keep doing the stretches and exercises they are doing in physical therapy.  Check with family medicine about getting the ambulatory blood pressure cuff.  You can see Dr. Raymondo Band and just call and make an appointment.

## 2012-01-23 NOTE — Assessment & Plan Note (Signed)
Decision today to treat with OMT was based on Physical Exam  After verbal consent patient was treated with ME techniques in rib on right areas  Patient tolerated the procedure well with improvement in symptoms  Patient given exercises, stretches and lifestyle modifications  See medications in patient instructions if given  Patient will follow up in 6-8 weeks

## 2012-01-23 NOTE — Assessment & Plan Note (Addendum)
It does appear that patient's shoes been wide enough and pneumonia to position does make for a better gait for patient. Patient continues to wear the custom orthotics in his shoes and we'll continue to monitor. I do not think we significant changes need to be made to the orthotic at this time. Patient can continue the arch strap is as necessary as well.

## 2012-01-29 ENCOUNTER — Encounter: Payer: Self-pay | Admitting: Family Medicine

## 2012-02-06 ENCOUNTER — Ambulatory Visit: Payer: BC Managed Care – PPO | Admitting: Family Medicine

## 2012-02-06 ENCOUNTER — Ambulatory Visit (INDEPENDENT_AMBULATORY_CARE_PROVIDER_SITE_OTHER): Payer: BC Managed Care – PPO | Admitting: Family Medicine

## 2012-02-06 VITALS — BP 130/80 | Ht 60.0 in | Wt 220.0 lb

## 2012-02-06 DIAGNOSIS — M25561 Pain in right knee: Secondary | ICD-10-CM

## 2012-02-06 DIAGNOSIS — M999 Biomechanical lesion, unspecified: Secondary | ICD-10-CM

## 2012-02-06 DIAGNOSIS — M25569 Pain in unspecified knee: Secondary | ICD-10-CM

## 2012-02-06 NOTE — Progress Notes (Signed)
Chief complaint  rib pain OMT therapy and f/u knee pain.   History of present illness: Patient is also complaining of some rib pain. Patient was seen previously and was diagnosed with in inhalation rib of the T9 area. Patient states that she did go to a chiropractor within the week after initial presentation for this problem and had another manipulation which did seem to resolve some of the pain. Patient states continues to do better but didn't go to physical therapy today and with exercising started to have the discomfort in the area again. Patient states though that she's been able to do physical therapy working on the right shoulder which he did have rotator cuff repair on. Overall no radiation, no swelling of the lower extremities and no shortness of breath associated with the rib pain.   Regarding her right knee pain. Patient has had this right knee pain for some time and did have x-ray showing a loose body in the posterior aspect of the joint space. Patient has not seen Dr. Ave Filter yet but will be going for further evaluation in the next  week.  Review of systems: 14 system review is done and unremarkable as related to the orthopedic problem.  Physical exam Height 5' (1.524 m), weight 220 lb (99.791 kg). General: No apparent distress alert and oriented x3 mood and affect somewhat normal but blunted.  Respiratory: Patient's recent full sentences and does not appear short of breath  Skin: Warm dry intact with no signs of infection or rash  Neuro: Cranial nerves II through XII are intact, neurovascularly intact in all extremities with 2+ DTRs and 2+ pulses.  Foot exam:   OMT findings  Patient does have a slip rib stuck in inhalation at the T9 vertebrae on the right side. After verbal and written consent patient was manipulated with muscle energy with significant improvement in pain. Other findings L1 F RS left Sacrum left on left

## 2012-02-06 NOTE — Assessment & Plan Note (Signed)
Discussed patient's knee pain again with her having a posterior osteophyte versus loose body that could be contributing to her pain. Patient is going to followup with Dr. Ave Filter to see if arthroscopic procedures possible or if total knee replacement is necessary. Patient has been stable since last visit and does appearing good spirits and not taking any pain medications which is very optimistically.

## 2012-02-06 NOTE — Assessment & Plan Note (Signed)
Decision today to treat with OMT was based on Physical Exam  After verbal consent patient was treated with ME, FPR, counterstrain techniques in lumbar, sacral and thoracic areas  Patient tolerated the procedure well with improvement in symptoms  Patient given exercises, stretches and lifestyle modifications  See medications in patient instructions if given  Patient will follow up in 6 weeks

## 2012-02-06 NOTE — Assessment & Plan Note (Signed)
Regarding this pain patient did have tighter hip musculature mostly around the left-sided hip flexor or psoas muscle. Patient was given some home exercises and we'll be going to physical therapy where she can ask this to be addressed as well. Patient will followup in the next 6 weeks for another manipulation.  Decision today to treat with OMT was based on Physical Exam  After verbal consent patient was treated with ME and FPR techniques in lumbar, sacral and thoracic areas  Patient tolerated the procedure well with improvement in symptoms  Patient given exercises, stretches and lifestyle modifications  See medications in patient instructions if given  Patient will follow up in 6 weeks

## 2012-02-06 NOTE — Patient Instructions (Signed)
Very good to see you.  Try to get physical therapy to teach you psoas (hip flexor stretches) Follow up with Dr. Ave Filter Come see me again in 6 weeks.

## 2012-03-02 ENCOUNTER — Other Ambulatory Visit: Payer: Self-pay

## 2012-03-19 ENCOUNTER — Ambulatory Visit (INDEPENDENT_AMBULATORY_CARE_PROVIDER_SITE_OTHER): Payer: BC Managed Care – PPO | Admitting: Family Medicine

## 2012-03-19 VITALS — BP 140/80 | Ht 60.0 in | Wt 220.0 lb

## 2012-03-19 DIAGNOSIS — M7062 Trochanteric bursitis, left hip: Secondary | ICD-10-CM

## 2012-03-19 DIAGNOSIS — M76899 Other specified enthesopathies of unspecified lower limb, excluding foot: Secondary | ICD-10-CM

## 2012-03-19 DIAGNOSIS — M999 Biomechanical lesion, unspecified: Secondary | ICD-10-CM

## 2012-03-19 NOTE — Progress Notes (Signed)
Chief complaint: Repeat here for O&P guarantee as well as left hip pain.  History of present illness: Patient has had this reviewed for some time. Patient was diagnosed with an inhalation rib of the T9 area and has been manipulated multiple times. Patient states that overall it seems to be improving but has had some discomfort over the course of the last few days. Patient states it is worse with the weather from time to time. Patient describes any new symptoms or any new radiation of pain. Patient denies any fevers chills or any abnormal weight loss. Patient feel she is responding very well to physical therapy but she is doing at home. Patient has now stopped doing formal physical therapy for her shoulder.  Patient has recently started having more of a left hip pain. Patient does have known osteoarthritic changes of the right knee and thinks she may be compensating. Patient states that her hip hurts more on the lateral aspect, is tender to palpation, tibia constant sharp ache that occurs with certain movements. Patient states that she can get a snapping sensation that gives her a significant amount of pain but that causes resolution over the course of the next hours. Patient denies much weakness but finds that if she tries to go up stairs in lead with that foot she is weaker but if she bends in a more flexed position she does okay. Denies any radiation of pain denies any fevers or chills or any numbness of the extremity.  Physical exam Blood pressure 140/80, height 5' (1.524 m), weight 220 lb (99.791 kg). General: No apparent distress alert and oriented x3 mood and affect normal Respiratory: Patient's speak in full sentences and does not appear short of breath Skin: Warm dry intact with no signs of infection or rash Neuro: Cranial nerves II through XII are intact, neurovascularly intact in all extremities with 2+ DTRs and 2+ pulses. Left hip exam: Patient has good range of motion including internal  rotation. She is neurovascularly intact distally. Patient has good strength but is very tender to palpation over the greater trochanteric area. Patient does have somewhat of a tight IT band. OMT findings  Patient does have a slip rib stuck in inhalation at the T9 vertebrae on the right side better motion than last visit.  Still tender to palpation.  Other findings  L1 F RS left  Sacrum left on left

## 2012-03-19 NOTE — Assessment & Plan Note (Signed)
Decision today to treat with OMT was based on Physical Exam  After verbal consent patient was treated with HVLA and ME techniques in lumbosacral areas  Patient tolerated the procedure well with improvement in symptoms  Patient given exercises, stretches and lifestyle modifications  See medications in patient instructions if given  Patient will follow up in 6-8 weeks

## 2012-03-19 NOTE — Assessment & Plan Note (Signed)
Patient has had this before. Patient always given a home exercise program now and told to use the ketoprofen gel. Patient will try these 2 interventions and then return in 3-6 weeks. The patient continues to have pain I would consider doing a corticosteroid injection.

## 2012-03-19 NOTE — Assessment & Plan Note (Signed)
Same as above

## 2012-04-16 ENCOUNTER — Ambulatory Visit (INDEPENDENT_AMBULATORY_CARE_PROVIDER_SITE_OTHER): Payer: BC Managed Care – PPO | Admitting: Family Medicine

## 2012-04-16 ENCOUNTER — Ambulatory Visit
Admission: RE | Admit: 2012-04-16 | Discharge: 2012-04-16 | Disposition: A | Discharge status: Home / Self Care | Payer: BC Managed Care – PPO | Source: Ambulatory Visit | Attending: Sports Medicine | Admitting: Sports Medicine

## 2012-04-16 VITALS — BP 128/90 | Ht 60.0 in | Wt 220.0 lb

## 2012-04-16 DIAGNOSIS — S76111A Strain of right quadriceps muscle, fascia and tendon, initial encounter: Secondary | ICD-10-CM

## 2012-04-16 DIAGNOSIS — S838X9A Sprain of other specified parts of unspecified knee, initial encounter: Secondary | ICD-10-CM

## 2012-04-16 DIAGNOSIS — M76899 Other specified enthesopathies of unspecified lower limb, excluding foot: Secondary | ICD-10-CM

## 2012-04-16 DIAGNOSIS — M25561 Pain in right knee: Secondary | ICD-10-CM

## 2012-04-16 DIAGNOSIS — M25562 Pain in left knee: Secondary | ICD-10-CM

## 2012-04-16 DIAGNOSIS — M999 Biomechanical lesion, unspecified: Secondary | ICD-10-CM

## 2012-04-16 DIAGNOSIS — M25569 Pain in unspecified knee: Secondary | ICD-10-CM

## 2012-04-16 DIAGNOSIS — M7062 Trochanteric bursitis, left hip: Secondary | ICD-10-CM

## 2012-04-16 NOTE — Patient Instructions (Addendum)
Very nice to see you I am giving you exercises for piriformis and quad strain.  Try to do them daily.  Your husbands foam roller should help as well.  I am getting x rays of your left knee . I will call you with the results.  I want you to come back in 6-8 weeks for follow for your strain.  email me the info on methylcobalamin.

## 2012-04-16 NOTE — Assessment & Plan Note (Signed)
Decision today to treat with OMT was based on Physical Exam  After verbal consent patient was treated with HVLA and MEtechniques in Thoracic and sacral areas  Patient tolerated the procedure well with improvement in symptoms  Patient given exercises, stretches and lifestyle modifications  See medications in patient instructions if given  Patient will follow up in 6-8 weeks

## 2012-04-16 NOTE — Progress Notes (Signed)
Patient is here for multiple complaints.   Patient is here for follow up for manipulation medicine.  She states it does seem to be working.  Patient states she has been doing better including the left lateal hip pain.  Patient states she has noticed some improvement with the stretches but now is having pain more in the buttocks region.  States she is able to walk on a regular basis but still thinks she is compensating for her right knee pain (Tricompartmental disease).  Patient denies radiation of pain and denies any nighttime awakening. Patient is wondering if her bursitis she has had previously is coming back .   She also is having right leg pain for the last 4 days.  She was in the garden a lot the other day and noticed pain the next day, she does state is improving with some stretching.  Patient though still states that it is hurting more on the front of her leg and is sharp sensation with certain movements. Denies back pain. Different pain then her knee.      Physical exam  Blood pressure 128/90, height 5' (1.524 m), weight 220 lb (99.791 kg).  General: No apparent distress alert and oriented x3 mood and affect normal  Respiratory: Patient's speak in full sentences and does not appear short of breath  Skin: Warm dry intact with no signs of infection or rash  Neuro: Cranial nerves II through XII are intact, neurovascularly intact in all extremities with 2+ DTRs and 2+ pulses.  Left hip exam: Patient has good range of motion including internal rotation. She is neurovascularly intact distally. Patient has good strength mild tender to palpation over the greater trochanteric area.  Patient is very tender over piriformis muscle as well.  Patient does have somewhat of a tight IT band still.  Right leg did have TTP vastus lateralis. In muscle belly.  OMT findings  Patient does have a slip rib stuck in inhalation at the T9 vertebrae on the right side better motion than last visit. Still tender to  palpation.  Other findings  L1 F RS left  Sacrum left on left

## 2012-04-17 ENCOUNTER — Telehealth: Payer: Self-pay | Admitting: *Deleted

## 2012-04-17 DIAGNOSIS — S76119A Strain of unspecified quadriceps muscle, fascia and tendon, initial encounter: Secondary | ICD-10-CM | POA: Insufficient documentation

## 2012-04-17 NOTE — Telephone Encounter (Signed)
Left pt a message to return my call.

## 2012-04-17 NOTE — Assessment & Plan Note (Signed)
Patient seems to be improving with conservative therapy. Patient will continue doing what she is doing and was given more stretches. Did not do a corticosteroid injection at this time. If patient has worsening pain we'll consider do that in the future.

## 2012-04-17 NOTE — Assessment & Plan Note (Signed)
Patient likely has a strain of her quadricep muscle. I do not think that this is iliotibial band and she seems to be improving on her own accord. Patient was given a home exercise program and told to do 600 mg of ibuprofen 3 times a day for 3 days. Patient also given an icing protocol. Patient will followup again in 6 weeks and if not better we can consider a ultrasound.

## 2012-04-17 NOTE — Assessment & Plan Note (Signed)
Decision today to treat with OMT was based on Physical Exam  After verbal consent patient was treated with HVLA, ME techniques in thoracic, lumbar and sacral areas  Patient tolerated the procedure well with improvement in symptoms  Patient given exercises, stretches and lifestyle modifications  See medications in patient instructions if given  Patient will follow up in 6-8 weeks    

## 2012-04-17 NOTE — Telephone Encounter (Signed)
Message copied by Mora Bellman on Wed Apr 17, 2012  1:45 PM ------      Message from: Judi Saa      Created: Wed Apr 17, 2012  1:16 PM         Hi Samay Delcarlo,       Could you call ms. Mull and tell her her left knee has arthritis but not as bad as her right knee. Nothing needs to be done.       Thanks      ----- Message -----         From: Rad Results In Interface         Sent: 04/16/2012   5:30 PM           To: Judi Saa, DO                   ------

## 2012-04-18 NOTE — Telephone Encounter (Signed)
Pt notified of xray results

## 2012-05-06 ENCOUNTER — Encounter: Payer: Self-pay | Admitting: Obstetrics and Gynecology

## 2012-05-06 ENCOUNTER — Ambulatory Visit (INDEPENDENT_AMBULATORY_CARE_PROVIDER_SITE_OTHER): Payer: BC Managed Care – PPO | Admitting: Obstetrics and Gynecology

## 2012-05-06 VITALS — BP 140/80 | Ht 60.0 in | Wt 222.0 lb

## 2012-05-06 DIAGNOSIS — R140 Abdominal distension (gaseous): Secondary | ICD-10-CM

## 2012-05-06 DIAGNOSIS — Z8041 Family history of malignant neoplasm of ovary: Secondary | ICD-10-CM

## 2012-05-06 DIAGNOSIS — R143 Flatulence: Secondary | ICD-10-CM

## 2012-05-06 DIAGNOSIS — R141 Gas pain: Secondary | ICD-10-CM

## 2012-05-06 DIAGNOSIS — Z0000 Encounter for general adult medical examination without abnormal findings: Secondary | ICD-10-CM

## 2012-05-06 DIAGNOSIS — Z01419 Encounter for gynecological examination (general) (routine) without abnormal findings: Secondary | ICD-10-CM

## 2012-05-06 LAB — POCT URINALYSIS DIPSTICK
Bilirubin, UA: NEGATIVE
Blood, UA: NEGATIVE
Glucose, UA: NEGATIVE
Ketones, UA: NEGATIVE
Urobilinogen, UA: NEGATIVE

## 2012-05-06 NOTE — Progress Notes (Signed)
Patient ID: Kerry [name], female   DOB: 1950/03/29, 62 y.o.   MRN: [id number] 62 y.o.  Married  Caucasian female   G2P2002 here for annual exam.    Patient has had chronic right lower quadrant pain for years.  Dr.  Thomasena Edis was prior gynecologist.  Patient was diagnosed with a hiatal hernia.  Patient has also seen Dr. Katrinka Blazing at [facility].  Patient has had multiple ultrasounds and MRIs.  Has also seen GI.    Patient also has joint swelling and has seen her PCP in  and Rheumatologist at [facility].    Sister diagnosed with ovarian cancer in her 44s.  No history of breast cancer in the family.  Not Jewish.  Asking about guidelines for mammograms.  Patient's last menstrual period was 01/16/2002.          Sexually active: yes  The current method of family planning is post menopausal status.    Exercising: water aerobics Last mammogram: 11-24-08 [facility]  Last pap smear:  10-21-09 wnl:neg HPV History of abnormal pap: no Smoking: no Alcohol: no Last colonoscopy: 2009 wnl: Dr. Braulio Conte in [place] Last Bone Density: 2009 wnl: Dr. Thomasena Edis  Last tetanus shot: 2003.  Patient states some sometimes has a reaction to vaccines. Last cholesterol check: 15 mo. Ago History of uterine and cervical fibroid (2.4 cm) by prior ultrasounds.   Positive HSV I IgG 2009  Hgb:  15.3              Urine: neg    Health Maintenance  Topic Date Due  . Influenza Vaccine  09/16/2012  . Mammogram  01/26/2013  . Pap Smear  12/28/2013  . Colonoscopy  01/27/2019  . Tetanus/tdap  01/26/2021  . Zostavax  Addressed    Family History  Problem Relation Age of Onset  . Rheumatologic disease Mother   . Hypertension Mother   . Hypertension Father   . Rheumatologic disease Sister   . Arthritis Sister     Patient Active Problem List  Diagnosis  . Allergic rhinitis, cause unspecified  . Anxiety  . Depression  . Chronic pain  . GERD (gastroesophageal reflux disease)  . HTN  (hypertension)  . Sleep apnea  . Obesity  . Fatigue  . Nonallopathic lesion of lumbosacral region  . Pes planus  . Right knee pain  . Greater trochanteric bursitis of left hip  . Vitamin B 12 deficiency  . TMJ (temporomandibular joint syndrome)  . Bilateral foot pain  . Rotator cuff disorder  . Left shoulder pain  . Nonallopathic lesion of costovertebral region  . Strain of quadriceps muscle    Past Medical History  Diagnosis Date  . Allergic rhinitis, cause unspecified   . Anxiety   . Depression   . Chronic pain   . GERD (gastroesophageal reflux disease)   . HTN (hypertension)   . Sleep apnea   . Migraines   . Abnormal uterine bleeding   . Dyspareunia   . Fibroid   . Urinary incontinence     Past Surgical History  Procedure Laterality Date  . Umbilical hernia repair  2010  . Carpal tunnel release  1998  . Dental implants  2010  . Esophageal dilation  2010  . Hammer toe surgery      rt  . Septoplasty    . Rotator cuff repair Right 09/2011    Dr. Ave Filter  . Pelvic laparoscopy  1990  .  dilatation and curettage  2000  Allergies: Cefixime; Celebrex; Levofloxacin; Penicillins; Adhesive; and Tetracyclines & related  Current Outpatient Prescriptions  Medication Sig Dispense Refill  . Ascorbic Acid (VITAMIN C) 500 MG CAPS Take 500 mg by mouth daily.  60 capsule  0  . Astaxanthin 4 MG CAPS Take 1 capsule by mouth daily.      . cyanocobalamin (,VITAMIN B-12,) 1000 MCG/ML injection Do an injection weekly for the first 4 weeks then monthly thereafter.  10 mL  1  . ergocalciferol (DRISDOL) 8000 UNIT/ML drops Take 4,000 Units by mouth daily.      . fluocinonide cream (LIDEX) 0.05 % as needed.      Boris Lown Oil CAPS Take by mouth.      . Needles & Syringes MISC 1 Can by Does not apply route once a week. Dispense meals for B12 IM injections please thank you  10 each  1  . Ubiquinol 100 MG CAPS Take 1 capsule by mouth daily.      Marland Kitchen oxyCODONE-acetaminophen (ROXICET) 5-325  MG per tablet Take 1-2 tablets by mouth every 4 (four) hours as needed for pain.  60 tablet  0   No current facility-administered medications for this visit.    ROS: Pertinent items are noted in HPI.  Multiple positive review of symptoms leg swelling which varies, muscle weakness, new onset joint pain, hearing loss, shortness of breath, itching of skin, diarrhea that varies with diet, difficulty with memory, sadness due to recent passing of sibling, urinary incontinence, urinary urgency, vulvar pain and itching.   Social Hx:  Married.  Retired.   Exam:    BP 140/80  Ht 5' (1.524 m)  Wt 222 lb (100.699 kg)  BMI 43.36 kg/m2  LMP 01/16/2002   Wt Readings from Last 3 Encounters:  05/06/12 222 lb (100.699 kg)  04/16/12 220 lb (99.791 kg)  03/19/12 220 lb (99.791 kg)     Ht Readings from Last 3 Encounters:  05/06/12 5' (1.524 m)  04/16/12 5' (1.524 m)  03/19/12 5' (1.524 m)    General appearance: alert, cooperative and appears stated age Head: Normocephalic, without obvious abnormality, atraumatic Neck: no adenopathy, supple, symmetrical, trachea midline and thyroid not enlarged, symmetric, no tenderness/mass/nodules Lungs: clear to auscultation bilaterally Breasts: Inspection negative, No nipple retraction or dimpling, No nipple discharge or bleeding, No axillary or supraclavicular adenopathy, Normal to palpation without dominant masses Heart: regular rate and rhythm Abdomen: Obese.  Seems tensely distended, non-tender; no masses,  no organomegaly Extremities: extremities normal, atraumatic, no cyanosis or edema Skin: Skin color, texture, turgor normal. No rashes or lesions Lymph nodes: Cervical, supraclavicular, and axillary nodes normal. No abnormal inguinal nodes palpated Neurologic: Grossly normal   Pelvic: External genitalia:  no lesions              Urethra:  normal appearing urethra with no masses, tenderness or lesions              Bartholins and Skenes: normal                  Vagina: normal appearing vagina with normal color and discharge, no lesions              Cervix: normal appearance              Pap taken: yes and HR HPV requested.        Bimanual Exam:  Uterus:  uterus is normal size, shape, consistency and nontender.  Bimanual exam is limited by body habitus.  Adnexa: normal adnexa in size, nontender and no masses                                      Rectovaginal: Confirms                                      Anus:  normal sphincter tone, no lesions  A: normal gyn exam Family history of sister with ovarian cancer. History of uterine fibroids. Abdominal distention/bloating Multiple somatic complaints.     P: mammogram due.  Patient given name and phone number of the [facility] so she can call to make an appointment.  I will order this.  I discussed mammogram as the approved screening test to detect early breast cancer. pap smear and HR HPV testing. Return for pelvic ultrasound. I recommend patient follow up with her internist for further evaluation of her medical conditions. I recommend patient have vaccines with her PCP. return annually or prn     An After Visit Summary was printed and given to the patient.

## 2012-05-06 NOTE — Patient Instructions (Addendum)

## 2012-05-08 ENCOUNTER — Other Ambulatory Visit: Payer: Self-pay

## 2012-05-08 DIAGNOSIS — Z1231 Encounter for screening mammogram for malignant neoplasm of breast: Secondary | ICD-10-CM

## 2012-05-08 LAB — IPS PAP TEST WITH HPV

## 2012-05-15 ENCOUNTER — Telehealth: Payer: Self-pay | Admitting: Obstetrics and Gynecology

## 2012-05-15 NOTE — Telephone Encounter (Signed)
[facility] [place] [phone] fax/[phone] phone. Patient needs order for routine mmg/include pts phone # with order/[place]

## 2012-05-15 NOTE — Telephone Encounter (Signed)
Please sign form for [facility]. Patient has appt. On May 9th,2014 at 11:30am. Fannie Knee will fax.

## 2012-05-15 NOTE — Telephone Encounter (Signed)
Done

## 2012-05-16 ENCOUNTER — Other Ambulatory Visit: Payer: Self-pay | Admitting: Obstetrics and Gynecology

## 2012-05-16 DIAGNOSIS — R140 Abdominal distension (gaseous): Secondary | ICD-10-CM

## 2012-05-16 DIAGNOSIS — Z8041 Family history of malignant neoplasm of ovary: Secondary | ICD-10-CM

## 2012-05-16 DIAGNOSIS — R1031 Right lower quadrant pain: Secondary | ICD-10-CM

## 2012-05-16 DIAGNOSIS — R102 Pelvic and perineal pain: Secondary | ICD-10-CM

## 2012-05-21 ENCOUNTER — Ambulatory Visit: Payer: BC Managed Care – PPO | Admitting: Family Medicine

## 2012-05-27 ENCOUNTER — Ambulatory Visit (INDEPENDENT_AMBULATORY_CARE_PROVIDER_SITE_OTHER): Payer: BC Managed Care – PPO | Admitting: Obstetrics and Gynecology

## 2012-05-27 ENCOUNTER — Ambulatory Visit (INDEPENDENT_AMBULATORY_CARE_PROVIDER_SITE_OTHER): Payer: BC Managed Care – PPO

## 2012-05-27 ENCOUNTER — Other Ambulatory Visit: Payer: Self-pay | Admitting: Obstetrics and Gynecology

## 2012-05-27 DIAGNOSIS — R140 Abdominal distension (gaseous): Secondary | ICD-10-CM

## 2012-05-27 DIAGNOSIS — D259 Leiomyoma of uterus, unspecified: Secondary | ICD-10-CM

## 2012-05-27 DIAGNOSIS — N949 Unspecified condition associated with female genital organs and menstrual cycle: Secondary | ICD-10-CM

## 2012-05-27 DIAGNOSIS — R102 Pelvic and perineal pain unspecified side: Secondary | ICD-10-CM

## 2012-05-27 DIAGNOSIS — D251 Intramural leiomyoma of uterus: Secondary | ICD-10-CM

## 2012-05-27 DIAGNOSIS — Z8041 Family history of malignant neoplasm of ovary: Secondary | ICD-10-CM

## 2012-05-27 DIAGNOSIS — R1031 Right lower quadrant pain: Secondary | ICD-10-CM

## 2012-05-27 DIAGNOSIS — D219 Benign neoplasm of connective and other soft tissue, unspecified: Secondary | ICD-10-CM

## 2012-05-27 DIAGNOSIS — N83339 Acquired atrophy of ovary and fallopian tube, unspecified side: Secondary | ICD-10-CM

## 2012-05-27 DIAGNOSIS — R928 Other abnormal and inconclusive findings on diagnostic imaging of breast: Secondary | ICD-10-CM

## 2012-05-27 DIAGNOSIS — R141 Gas pain: Secondary | ICD-10-CM

## 2012-05-27 NOTE — Progress Notes (Signed)
  Subjective  Patient presents for pelvic ultrasound prompted by RLQ pain and family history of ovarian cancer.  Patient has know cervical fibroid followed by her prior gynecologist.  Has seen other physicians about her RLQ pain.   Patient asking if ultrasound shows any reason for incontinence.  Believes she may have had an operative vaginal delivery.  States her mother had pelvic organ prolapse.  Does Kegel exercises.  Uses a panty liner when goes out.  Leakage occurs with coughing, laughing, and sudden movement.  Occasional urgency when has a full bladder in am when first awakens.  No prior bladder treatment.    Patient also asking if mammogram result has returned.  Sister had ovarian cancer.    One brother with Crohn's disease and another brother with ulcerative colitis.    Objective  See report below for ultrasound results - Uterine fibroids and normal ovaries noted.  Mammogram - [facility] shows possible right breast mass.  Assessment  RLQ pain.  Not likely GYN pathology. Uterine fibroids. Family history of ovarian cancer.  Normal ovaries on ultrasound. Genuine stress incontinence by history. Abnormal screening mammogram.  Plan  Patient will pursue further evaluation of RLQ pain with her gastroenterologist. I discussed etiologies of genuine stress incontinence with patient.  We discussed pelvic floor exercises, weight loss, and surgery to correct the incontinence. Patient will be scheduled for a diagnostic mammogram and ultrasound by our office.  She prefers to do this at [facility] as she lives in [place].

## 2012-05-27 NOTE — Patient Instructions (Addendum)
Fibroids Fibroids are lumps (tumors) that can occur any place in a woman's body. These lumps are not cancerous. Fibroids vary in size, weight, and where they grow. HOME CARE  Do not take aspirin.  Write down the number of pads or tampons you use during your period. Tell your doctor. This can help determine the best treatment for you. GET HELP RIGHT AWAY IF:  You have pain in your lower belly (abdomen) that is not helped with medicine.  You have cramps that are not helped with medicine.  You have more bleeding between or during your period.  You feel lightheaded or pass out (faint).  Your lower belly pain gets worse. MAKE SURE YOU:  Understand these instructions.  Will watch your condition.  Will get help right away if you are not doing well or get worse. Document Released: 02/04/2010 Document Revised: 03/27/2011 Document Reviewed: 02/04/2010 [employer] Patient Information 2013 [employer].  

## 2012-05-28 ENCOUNTER — Telehealth: Payer: Self-pay | Admitting: *Deleted

## 2012-05-28 ENCOUNTER — Encounter: Payer: Self-pay | Admitting: Obstetrics and Gynecology

## 2012-05-28 DIAGNOSIS — D219 Benign neoplasm of connective and other soft tissue, unspecified: Secondary | ICD-10-CM | POA: Insufficient documentation

## 2012-05-28 DIAGNOSIS — Z8041 Family history of malignant neoplasm of ovary: Secondary | ICD-10-CM | POA: Insufficient documentation

## 2012-05-28 NOTE — Telephone Encounter (Signed)
Please sign form to be faxed to [facility]. For diagnotic breast right mammogram/ right ultra sound if needed. Kerry [name]. Will fax.

## 2012-05-29 NOTE — Telephone Encounter (Signed)
Form faxed to [facility].

## 2012-05-29 NOTE — Telephone Encounter (Signed)
Done.  Kerry [name]

## 2012-06-03 ENCOUNTER — Ambulatory Visit: Payer: BC Managed Care – PPO

## 2012-06-11 ENCOUNTER — Ambulatory Visit (INDEPENDENT_AMBULATORY_CARE_PROVIDER_SITE_OTHER): Payer: BC Managed Care – PPO | Admitting: Family Medicine

## 2012-06-11 ENCOUNTER — Encounter: Payer: Self-pay | Admitting: Family Medicine

## 2012-06-11 VITALS — BP 140/80 | Ht 60.0 in | Wt 222.0 lb

## 2012-06-11 DIAGNOSIS — M2140 Flat foot [pes planus] (acquired), unspecified foot: Secondary | ICD-10-CM

## 2012-06-11 DIAGNOSIS — E538 Deficiency of other specified B group vitamins: Secondary | ICD-10-CM

## 2012-06-11 DIAGNOSIS — M25561 Pain in right knee: Secondary | ICD-10-CM

## 2012-06-11 DIAGNOSIS — M25569 Pain in unspecified knee: Secondary | ICD-10-CM

## 2012-06-11 MED ORDER — CYANOCOBALAMIN 1000 MCG/ML IJ SOLN: INTRAMUSCULAR | Status: DC

## 2012-06-11 MED ORDER — CYANOCOBALAMIN-METHYLCOBALAMIN 600-600 MCG SL SUBL: 1.0000 | SUBLINGUAL_TABLET | Freq: Every day | SUBLINGUAL | Status: DC

## 2012-06-11 NOTE — Patient Instructions (Addendum)
If you want try the new vitamin B12.  It may be good but if you don't mind taking the injections this probably is better.  Try the changes I made to your shoes Continue all the exercises that do not hurt your knee 3 times a week.  Come back for another manipulation in 10-12 weeks  I will be at [employer] office starting August 1st.

## 2012-06-12 NOTE — Assessment & Plan Note (Signed)
Patient did have the adjustments of the orthotics and stated that this was comfortable. Patient will try this and return again in 2-3 weeks if still having pain we'll discuss other changes that could be helpful.

## 2012-06-12 NOTE — Progress Notes (Signed)
Patient is here for multiple problems.  1. foot pain bilaterally: Patient has had the pain for quite some time. Patient has been in custom orthotics for months now and states that she was having improvement. Patient though did have an exacerbation of the pain when she is standing on her feet for long amount of time. Patient states that she's having pain more on the lateral aspects of the feet. Patient denies any new shoes but states she is doing much more activity than she is used to.  Patient describes the pain as more of a dull aching sensation. Patient denies any mechanical symptoms, denies any numbness and denies any swelling.  2.  patient also wanted to discuss B12 supplementation. When I was doing primary care patient was a patients of mine and we did supplement B12. Patient would like to know if we can refill this medication and also if methyl cobalamin could be better. Patient states that she has not been fatigued and has been doing very well overall.  3. right knee pain-patient has had right knee pain for some time and does have radiological evidence of severe end-stage degenerative joint disease with loose bodies. Patient has been doing home exercise program and working very diligently but has been doing multiple other exercises as well and think she is having trouble. Patient notices that it is hard for her to get off the ground. Patient denies any new symptoms of the knee, denies any significant mechanical symptoms, and denies any nighttime awakening. Patient is still not ready for replacement. Patient does not respond well to steroid injections but she would be a potential candidate. Patient has been told about viscous supplementation previously which she declined.  Past medical history, social, surgical and family history all reviewed.   Physical exam Blood pressure 140/80, height 5' (1.524 m), weight 222 lb (100.699 kg), last menstrual period 01/16/2002. General: No apparent distress alert  and oriented x3 mood and affect somewhat normal but blunted.  Respiratory: Patient's recent full sentences and does not appear short of breath  Skin: Warm dry intact with no signs of infection or rash  Neuro: Cranial nerves II through XII are intact, neurovascularly intact in all extremities with 2+ DTRs and 2+ pulses.   Foot exam:  Patient does have severe pes planus bilaterally. Patient also has medial bunions bilaterally on the great toes. In addition this patient does have breakdown of the transverse arch bilaterally. On the right foot patient does have hammertoes of the second and third toes orthotics show that patient's lateral padding of the metatarsals has slid and somewhat off the orthotic. This was expanded to cover the fourth and fifth.  Right knee exam: Patient does have falling to motion and does have trace effusion on the knee. She has mild crepitus to range of motion and mild lateral tracking of the patella. Patient is tender along the medial joint line and does have a positive McMurray's test. She is neurovascularly intact distally with 2+ DTRs.

## 2012-06-12 NOTE — Assessment & Plan Note (Signed)
Discussed with patient again at length and told her that she could potentially have steroid injections as well as viscous supplementation but in the long run she will need to have a knee replacement. He should like to continue to try to do this conservatively with physical therapy at home. No new medications were given for this. Patient will followup as needed.

## 2012-06-12 NOTE — Assessment & Plan Note (Signed)
The discussed at length with the patient at this time. Patient clinically is feeling well but does not know which type of B12 supplementation she would like to do. Patient has been doing the IM injections monthly for quite some time now. Patient knows I would recheck lab again and she became clinically symptomatic. Patient was given a prescription for the injections as well as the potential to start sublingual methyl cobalamin. Patient will only take one of these medications when she decides and will call so we can update her meds list.

## 2012-06-18 ENCOUNTER — Encounter: Payer: Self-pay | Admitting: Family Medicine

## 2012-07-01 ENCOUNTER — Telehealth: Payer: Self-pay | Admitting: *Deleted

## 2012-07-01 NOTE — Telephone Encounter (Signed)
Take out of hold mammogram , next mammogram in one year. Placed in recall/04 per Dr. Ander Purpura

## 2012-07-09 ENCOUNTER — Encounter: Payer: Self-pay | Admitting: Family Medicine

## 2012-11-21 ENCOUNTER — Other Ambulatory Visit: Payer: Self-pay

## 2012-12-18 ENCOUNTER — Ambulatory Visit (INDEPENDENT_AMBULATORY_CARE_PROVIDER_SITE_OTHER): Payer: BC Managed Care – PPO | Admitting: Family Medicine

## 2012-12-18 VITALS — BP 138/80 | HR 98 | Wt 227.0 lb

## 2012-12-18 DIAGNOSIS — M79609 Pain in unspecified limb: Secondary | ICD-10-CM

## 2012-12-18 DIAGNOSIS — M79671 Pain in right foot: Secondary | ICD-10-CM

## 2012-12-18 DIAGNOSIS — M9981 Other biomechanical lesions of cervical region: Secondary | ICD-10-CM

## 2012-12-18 DIAGNOSIS — M25569 Pain in unspecified knee: Secondary | ICD-10-CM

## 2012-12-18 DIAGNOSIS — M999 Biomechanical lesion, unspecified: Secondary | ICD-10-CM

## 2012-12-18 DIAGNOSIS — M25561 Pain in right knee: Secondary | ICD-10-CM

## 2012-12-18 NOTE — Patient Instructions (Signed)
For shoes, I think the sandals are good but rocker bottom will give you flexion of mid foot which could cause pain.  Do they have one without rocker bottom?  Consider a heel lift or pad that could help Look up medial unloader brace for your knee and consider it if pain worsens.  Massager is good especially after activity not as much before.  With [place] consider active release only on neck and not direct.  Posture on the wall exercise- put feet, butt, shoulders and head on wall and hold for 5 minutes a day.  Come back in 4-6 weeks.

## 2012-12-18 NOTE — Progress Notes (Signed)
Pre-visit discussion using our clinic review tool. No additional management support is needed unless otherwise documented below in the visit note.  

## 2012-12-19 ENCOUNTER — Encounter: Payer: Self-pay | Admitting: Family Medicine

## 2012-12-19 DIAGNOSIS — M999 Biomechanical lesion, unspecified: Secondary | ICD-10-CM | POA: Insufficient documentation

## 2012-12-19 NOTE — Assessment & Plan Note (Signed)
Decision today to treat with OMT was based on Physical Exam  After verbal consent patient was treated with HVLA, ME techniques in cervical thoracic, lumbar and sacral areas  Patient tolerated the procedure well with improvement in symptoms  Patient given exercises, stretches and lifestyle modifications  See medications in patient instructions if given  Patient will follow up in 6-8  weeks         

## 2012-12-19 NOTE — Progress Notes (Signed)
Patient is here for multiple problems.  Seen at previous office.   1. foot pain bilaterally: Patient has had the pain for quite some time. Patient has been in custom orthotics for months now and states she started having worsening pain again. Patient went to the shoe marked it where they told her that the orthotics are not helpful in the new shoes. Patient states that the shoes seem to help initially but now is starting to have pain again. She has been noted to have them adjusted multiple times. Patient is wearing shoes which are sandals that have a cork insole as well as a rocker-bottom. This is causing patient to have some of her supination.  2. right knee pain-patient has had right knee pain for some time and does have radiological evidence of severe end-stage degenerative joint disease with loose bodies. Patient has been doing home exercise program and is also wearing a compression sleeve were seems to be helpful. Patient continues to do some icing as well which is helpful. Patient does not want to do any other intervention just wanted to me that it seems to be at her baseline.  3. back and neck pain: The patient has had this pain for quite some time and has been seen before for osteopathic manipulation. Patient has been going to a chiropractor in the interim which he states sometimes is helpful and sometimes not. Patient states that she would like to be evaluated. Patient feels that the thoracic spine on the right side of the neck on the left side seems to be more tight. Patient has been trying to do some exercises but has not noticed any significant improvement.  Past medical history, social, surgical and family history all reviewed.   Physical exam Blood pressure 138/80, pulse 98, weight 227 lb (102.967 kg), last menstrual period 01/16/2002, SpO2 98.00%. General: No apparent distress alert and oriented x3 mood and affect somewhat normal but blunted.  Respiratory: Patient's recent full sentences  and does not appear short of breath  Skin: Warm dry intact with no signs of infection or rash  Neuro: Cranial nerves II through XII are intact, neurovascularly intact in all extremities with 2+ DTRs and 2+ pulses.   Foot exam:  Patient does have severe pes planus bilaterally. Patient also has medial bunions bilaterally on the great toes. In addition this patient does have breakdown of the transverse arch bilaterally. On the right foot patient does have hammertoes of the second and third toes orthotics show that patient's lateral padding of the metatarsals has slid and somewhat off the orthotic. This was expanded to cover the fourth and fifth.  Right knee exam: Patient does have falling to motion and does have trace effusion on the knee. She has mild crepitus to range of motion and mild lateral tracking of the patella. Patient is tender along the medial joint line and does have a positive McMurray's test. She is neurovascularly intact distally with 2+ DTRs.   OMT findings  Cervical C4 extended rotated and side bent right C7 extended rotated and side bent left Thoracic  T9 extended rotated inside that right  Other findings  L1 F RS left  Sacrum left on left

## 2012-12-19 NOTE — Assessment & Plan Note (Signed)
Patient's bilateral foot pain did discuss potential changes that could be beneficial. Patient does have a hard anatomy to fit for shoes. Encourage her to see if they can do a heel lift to try to isolate the rocker-bottom. Patient will try these different changes and come back in 6 weeks.

## 2012-12-19 NOTE — Assessment & Plan Note (Signed)
Discussed again at great length about her knee is likely needing surgical intervention. Patient is wearing a compression sleeve which can be somewhat helpful but she needs more support I think it we considered a medial unloader brace. Patient is going to look into this brace him and decide. Patient can follow up again in 6 weeks. Patient could try corticosteroid injections as well as viscous supplementation the patient like to try natural approach.

## 2013-01-22 ENCOUNTER — Ambulatory Visit: Payer: BC Managed Care – PPO | Admitting: Family Medicine

## 2013-02-04 ENCOUNTER — Encounter: Payer: Self-pay | Admitting: Family Medicine

## 2013-02-04 ENCOUNTER — Ambulatory Visit (INDEPENDENT_AMBULATORY_CARE_PROVIDER_SITE_OTHER): Payer: BC Managed Care – PPO | Admitting: Family Medicine

## 2013-02-04 VITALS — BP 136/72 | HR 92 | Temp 99.4°F | Resp 16 | Wt 218.1 lb

## 2013-02-04 DIAGNOSIS — M999 Biomechanical lesion, unspecified: Secondary | ICD-10-CM

## 2013-02-04 DIAGNOSIS — M9981 Other biomechanical lesions of cervical region: Secondary | ICD-10-CM

## 2013-02-04 NOTE — Assessment & Plan Note (Signed)
Decision today to treat with OMT was based on Physical Exam  After verbal consent patient was treated with HVLA, ME techniques in cervical thoracic, lumbar and sacral areas  Patient tolerated the procedure well with improvement in symptoms  Patient given exercises, stretches and lifestyle modifications  See medications in patient instructions if given  Patient will follow up in 6-8  weeks         

## 2013-02-04 NOTE — Progress Notes (Signed)
Pre-visit discussion using our clinic review tool. No additional management support is needed unless otherwise documented below in the visit note.  

## 2013-02-04 NOTE — Progress Notes (Signed)
   back and neck pain: The patient is here for followup for back and neck pain. Patient has been attempting to do the posture exercises and states that she is making some improvement but finds these exercises difficult. Patient continues to have some mild numbness she states in her left wrist. Patient denies any new symptoms. Past medical history, social, surgical and family history all reviewed.   Physical exam Blood pressure 136/72, pulse 92, temperature 99.4 F (37.4 C), temperature source Oral, resp. rate 16, weight 218 lb 1.3 oz (98.92 kg), last menstrual period 01/16/2002, SpO2 97.00%. General: No apparent distress alert and oriented x3 mood and affect somewhat normal but blunted.  Respiratory: Patient's recent full sentences and does not appear short of breath  Skin: Warm dry intact with no signs of infection or rash  Neuro: Cranial nerves II through XII are intact, neurovascularly intact in all extremities with 2+ DTRs and 2+ pulses.    OMT findings  Cervical C4 extended rotated and side bent right C7 extended rotated and side bent left Thoracic  T9 extended rotated inside that right  Other findings  L1 F RS left  Sacrum left on left

## 2013-02-04 NOTE — Patient Instructions (Signed)
With the posture exercise, try to put shoulder, head, butt at a time. Or heel, butt, shoulders.  Aim is 5 minutes.  Sleep position, do not allow elbow above shoulder. Try hugging a pillow at night.  Alternate with a rigid sole shoe.  Arnica gel or capsicin.  Come back to see me 6 weeks. Make 30 minute appointment.

## 2013-02-05 ENCOUNTER — Ambulatory Visit: Payer: BC Managed Care – PPO | Admitting: Family Medicine

## 2013-03-19 ENCOUNTER — Ambulatory Visit: Payer: BC Managed Care – PPO | Admitting: Family Medicine

## 2013-03-26 ENCOUNTER — Telehealth: Payer: Self-pay | Admitting: Obstetrics and Gynecology

## 2013-03-26 NOTE — Telephone Encounter (Signed)
Patient is calling to figure out why she had to go back for additional mammogram year. She received an additional bill that was several hundred dollars. She is trying to figure out what the reasoning was exactly for the additional mammogram because she never heard anything back from our office.

## 2013-03-27 ENCOUNTER — Telehealth: Payer: Self-pay | Admitting: Obstetrics and Gynecology

## 2013-03-27 NOTE — Telephone Encounter (Signed)
Spoke with patient. We have scanned copies of Mammogram from 05/16/12 at [facility] that suggests she needs follow up on R breast. Patient then had follow up imaging at [facility] that reports a dx mammogram and R breast US. Patient inquiring about bill because she feels she did not have dx mammogram as she did not remember any "compressing" of the breast but does remember breast US. Advised patient if she has questions regarding treatment she can contact [facility] radiology as we have a report with dx mammogram and R breast US. Patient will call and discuss with [facility].

## 2013-03-27 NOTE — Telephone Encounter (Signed)
This encounter closed mistakenly. New telephone encounter opened for 03-27-13.

## 2013-03-27 NOTE — Telephone Encounter (Signed)
Patient returning [name]'s call. Previous phone note looks like it was closed in error.

## 2013-03-27 NOTE — Telephone Encounter (Signed)
[place] at 03/27/2013 10:12 AM      Status: Signed            Message left to return call to [place] at [phone].               Tiffany A Decker at 03/26/2013  3:58 PM      Status: Signed            Patient is calling to figure out why she had to go back for additional mammogram year. She received an additional bill that was several hundred dollars. She is trying to figure out what the reasoning was exactly for the additional mammogram because she never heard anything back from our office.

## 2013-03-27 NOTE — Telephone Encounter (Signed)
Message left to return call to Kerry [name] at [phone].    

## 2013-03-28 ENCOUNTER — Encounter: Payer: Self-pay | Admitting: Family Medicine

## 2013-03-28 ENCOUNTER — Ambulatory Visit (INDEPENDENT_AMBULATORY_CARE_PROVIDER_SITE_OTHER): Payer: BC Managed Care – PPO | Admitting: Family Medicine

## 2013-03-28 ENCOUNTER — Other Ambulatory Visit (INDEPENDENT_AMBULATORY_CARE_PROVIDER_SITE_OTHER): Payer: BC Managed Care – PPO

## 2013-03-28 VITALS — BP 130/78 | HR 102 | Temp 98.7°F | Resp 20 | Wt 224.0 lb

## 2013-03-28 DIAGNOSIS — M25561 Pain in right knee: Secondary | ICD-10-CM

## 2013-03-28 DIAGNOSIS — M25569 Pain in unspecified knee: Secondary | ICD-10-CM

## 2013-03-28 DIAGNOSIS — G8929 Other chronic pain: Secondary | ICD-10-CM

## 2013-03-28 DIAGNOSIS — R5381 Other malaise: Secondary | ICD-10-CM

## 2013-03-28 DIAGNOSIS — M9981 Other biomechanical lesions of cervical region: Secondary | ICD-10-CM

## 2013-03-28 DIAGNOSIS — R5383 Other fatigue: Principal | ICD-10-CM

## 2013-03-28 DIAGNOSIS — M999 Biomechanical lesion, unspecified: Secondary | ICD-10-CM

## 2013-03-28 DIAGNOSIS — E669 Obesity, unspecified: Secondary | ICD-10-CM

## 2013-03-28 LAB — TSH: TSH: 1.78 u[IU]/mL (ref 0.35–5.50)

## 2013-03-28 NOTE — Progress Notes (Signed)
   back and neck pain: The patient is here for followup for back and neck pain. Patient has been attempting to do the posture exercises and states that she is making some improvement.  Has not been walking is much secondary to her knee pain. Patient does have severe end-stage medial compartment osteoarthritis. Patient continues to wear her orthotics are wearing other type of shoes which have been beneficial. Patient continues to work on weight loss but finds it difficult. Patient has not had her thyroid checked for over 2 years. Patient is here also for her manipulation if evaluation warrants procedure.  Blood pressure 130/78, pulse 102, temperature 98.7 F (37.1 C), temperature source Oral, resp. rate 20, weight 224 lb (101.606 kg), last menstrual period 01/16/2002, SpO2 97.00%. General: No apparent distress alert and oriented x3 mood and affect somewhat normal but blunted.  Respiratory: Patient's recent full sentences and does not appear short of breath  Skin: Warm dry intact with no signs of infection or rash  Neuro: Cranial nerves II through XII are intact, neurovascularly intact in all extremities with 2+ DTRs and 2+ pulses.  Knee: Inspection shows osteoarthritic changes Pain over the medial joint line ROM full in flexion and extension and lower leg rotation. Ligaments with solid consistent endpoints including ACL, PCL, LCL, MCL. Negative Mcmurray's, Apley's, and Thessalonian tests. painful patellar compression. Patellar glide with severe crepitus. Patellar and quadriceps tendons unremarkable. Hamstring and quadriceps strength is normal.    OMT findings  Cervical C3 extended rotated and side bent right C7 extended rotated and side bent left Thoracic  T9 extended rotated inside that right  Other findings  L1 F RS left  Sacrum left on left

## 2013-03-28 NOTE — Patient Instructions (Signed)
Good to see you I will contact [facility] to see if we can get you a medial knee unloader brace.  Turmeric 500mg  twice daily.  I will look into the metal testing Draw labs downstairs today Come back again in 4-6 weeks. Make it a 30 minute appointment.

## 2013-03-28 NOTE — Progress Notes (Signed)
Pre visit review using our clinic review tool, if applicable. No additional management support is needed unless otherwise documented below in the visit note. 

## 2013-03-29 ENCOUNTER — Encounter: Payer: Self-pay | Admitting: Family Medicine

## 2013-03-29 LAB — T3: T3 TOTAL: 98.1 ng/dL (ref 80.0–204.0)

## 2013-03-29 LAB — T4: T4, Total: 8.2 ug/dL (ref 5.0–12.5)

## 2013-03-29 NOTE — Assessment & Plan Note (Signed)
Decision today to treat with OMT was based on Physical Exam  After verbal consent patient was treated with HVLA, ME techniques in cervical thoracic, lumbar and sacral areas  Patient tolerated the procedure well with improvement in symptoms  Patient given exercises, stretches and lifestyle modifications  See medications in patient instructions if given  Patient will follow up in 6-8  weeks         

## 2013-03-29 NOTE — Assessment & Plan Note (Signed)
We discussed again at great length.  Spent greater than 25 minutes with patient face-to-face and had greater than 50% of counseling including as described above in assessment and plan. Discussed different exercises that would be good for patient's knee. We discussed different labs and we'll check her thyroid hormone again. Encourage her that weight loss would likely help as a lot of her aches and pains especially her knee pain. Patient is responding to osteopathic manipulation will come back again in 4-6 weeks for further evaluation is likely treatment.

## 2013-03-29 NOTE — Assessment & Plan Note (Signed)
Discussed medial unloader brace with patient. We will discuss with manufacture about getting patient fitted. She will check with her insurance. We'll discuss a followup.

## 2013-04-29 ENCOUNTER — Ambulatory Visit: Payer: BC Managed Care – PPO | Admitting: Family Medicine

## 2013-04-30 ENCOUNTER — Ambulatory Visit (INDEPENDENT_AMBULATORY_CARE_PROVIDER_SITE_OTHER): Payer: BC Managed Care – PPO | Admitting: Family Medicine

## 2013-04-30 ENCOUNTER — Encounter: Payer: Self-pay | Admitting: Family Medicine

## 2013-04-30 ENCOUNTER — Other Ambulatory Visit (INDEPENDENT_AMBULATORY_CARE_PROVIDER_SITE_OTHER): Payer: BC Managed Care – PPO

## 2013-04-30 VITALS — BP 140/78 | HR 108 | Wt 226.0 lb

## 2013-04-30 DIAGNOSIS — M25519 Pain in unspecified shoulder: Secondary | ICD-10-CM

## 2013-04-30 DIAGNOSIS — M25512 Pain in left shoulder: Secondary | ICD-10-CM

## 2013-04-30 DIAGNOSIS — M9981 Other biomechanical lesions of cervical region: Secondary | ICD-10-CM

## 2013-04-30 DIAGNOSIS — M999 Biomechanical lesion, unspecified: Secondary | ICD-10-CM

## 2013-04-30 MED ORDER — NEEDLES & SYRINGES MISC: 1.0000 | Status: DC

## 2013-04-30 MED ORDER — CYANOCOBALAMIN 1000 MCG/ML IJ SOLN: INTRAMUSCULAR | Status: DC

## 2013-04-30 NOTE — Assessment & Plan Note (Signed)
Decision today to treat with OMT was based on Physical Exam  After verbal consent patient was treated with HVLA, ME techniques in cervical thoracic, lumbar and sacral areas  Patient tolerated the procedure well with improvement in symptoms  Patient given exercises, stretches and lifestyle modifications  See medications in patient instructions if given  Patient will follow up in 6 weeks    

## 2013-04-30 NOTE — Patient Instructions (Signed)
Good to see you Vitamin D at [facility] at least 2000 IU daily if not 4000IU. Try exercises for shoulder 3 times a week at least Ice 20 minutes before can help.  If not better in 3-6 week come back and we can do injection.  Continue turmeric.  See you soon.

## 2013-04-30 NOTE — Assessment & Plan Note (Signed)
Discussed with patient with low severity will try conservative therapy, given HEP, icing, and OTC medications.  If no improvement then will return in 6 weeks and will do injection.

## 2013-04-30 NOTE — Progress Notes (Signed)
CC: Follow  Up and left shoulder pain.    back and neck pain: The patient is here for followup for back and neck pain. Patient continue to do home exercises and working on posture, has made some improvement, also started turmeric and has noticed improvements. Denies any new symptoms but still mostly around neck and lower back.  Patietn though has had some more left shoulder pain.  Patient states still has full strength but has trouble with some of the exercsies or such thing as putting on bra. Denies radiation or numbness,  Can hur at night as well. Better with rest and turmeric.  Denies injury. Severity 4/10.    Blood pressure 140/78, pulse 108, weight 226 lb (102.513 kg), last menstrual period 01/16/2002, SpO2 95.00%. General: No apparent distress alert and oriented x3 mood and affect normal Respiratory: Patient's recent full sentences and does not appear short of breath  Skin: Warm dry intact with no signs of infection or rash  Neuro: Cranial nerves II through XII are intact, neurovascularly intact in all extremities with 2+ DTRs and 2+ pulses.   shoulder + impingement but full passive ROM, good grip strength and full RTC strength compared to surgically repaired contralateral side.  NVI distally.     OMT findings  Cervical C3 extended rotated and side bent right C7 extended rotated and side bent left thoracic  T1 E RS ight with elevated first rib.  T7 extended rotated inside that right  Other findings  L1 F RS left  Sacrum left on left   Ultrasound performed and interpreted by me. + Subacromial bursitis noted with mild OA changes.

## 2013-05-08 ENCOUNTER — Encounter: Payer: Self-pay | Admitting: Obstetrics and Gynecology

## 2013-05-08 ENCOUNTER — Ambulatory Visit (INDEPENDENT_AMBULATORY_CARE_PROVIDER_SITE_OTHER): Payer: BC Managed Care – PPO | Admitting: Obstetrics and Gynecology

## 2013-05-08 VITALS — BP 170/80 | HR 108 | Ht 59.75 in | Wt 226.2 lb

## 2013-05-08 DIAGNOSIS — Z01419 Encounter for gynecological examination (general) (routine) without abnormal findings: Secondary | ICD-10-CM

## 2013-05-08 DIAGNOSIS — Z0000 Encounter for general adult medical examination without abnormal findings: Secondary | ICD-10-CM

## 2013-05-08 DIAGNOSIS — Z13820 Encounter for screening for osteoporosis: Secondary | ICD-10-CM

## 2013-05-08 LAB — POCT URINALYSIS DIPSTICK
BILIRUBIN UA: NEGATIVE
Blood, UA: NEGATIVE
GLUCOSE UA: NEGATIVE
Ketones, UA: NEGATIVE
LEUKOCYTES UA: NEGATIVE
NITRITE UA: NEGATIVE
PH UA: 5
Protein, UA: NEGATIVE
Urobilinogen, UA: NEGATIVE

## 2013-05-08 NOTE — Patient Instructions (Signed)

## 2013-05-08 NOTE — Progress Notes (Signed)
Patient ID: Kerry [name], female   DOB: 1950-12-17, 63 y.o.   MRN: [id number] GYNECOLOGY VISIT  PCP:   Carlus Pavlov, MD  Referring provider:   HPI: 63 y.o.   Married  Caucasian  female   G2P2002 with Patient's last menstrual period was 01/16/2002.   here for  AEX   Recent diagnosis of right kidney stones which she passed on her own.  Will see urologist in follow up.  History of uterine fibroids. Had pelvic ultrasound in May 2014.  Patient had a long history of right lower quadrant pain.    Sister with ovarian cancer.   Some orange tint to the vaginal discharge. Has urinary leakage.   Hgb:   PCP Urine:  negative  GYNECOLOGIC HISTORY: Patient's last menstrual period was 01/16/2002. Sexually active:  no Partner preference: female Contraception:  postmenopausal  Menopausal hormone therapy: no DES exposure:   no Blood transfusions: no   Sexually transmitted diseases:   no GYN procedures and prior surgeries:  Laparoscopy Last mammogram: 05-17-12 possible right breast mass on screening mammogram at [facility] for diagnostic rt. Mammogram and ultrasound and normal.                Last pap and high risk HPV testing:  05-06-12 wnl:neg HR HPV  History of abnormal pap smear:  10 years ago had abnormal pap with 6 month follow up normal.  No colpo.  No treatment.    OB History   Grav Para Term Preterm Abortions TAB SAB Ect Mult Living   2 2 2       2        LIFESTYLE: Exercise:     Water aerobics but not recently          Tobacco:     no Alcohol:       no Drug use:    no  OTHER HEALTH MAINTENANCE: Tetanus/TDap:    2003 Gardisil:               n/a  Bone density:       2009 wnl with Dr. Theda Sers Colonoscopy:       2009 wnl in [place].  Next one due 2019.     Family History  Problem Relation Age of Onset  . Rheumatologic disease Mother   . Hypertension Mother   . Hypertension Father   . Rheumatologic disease Sister   . Arthritis Sister   . Ovarian cancer  Sister 55    Patient Active Problem List   Diagnosis Date Noted  . Nonallopathic lesion of cervical region 12/19/2012  . Fibroids 05/28/2012  . Family history of ovarian cancer 05/28/2012  . Strain of quadriceps muscle 04/17/2012  . Nonallopathic lesion of costovertebral region 01/23/2012  . Left shoulder pain 12/20/2011  . Rotator cuff disorder 09/26/2011  . Bilateral foot pain 09/20/2011  . Vitamin B 12 deficiency 03/05/2011  . TMJ (temporomandibular joint syndrome) 03/05/2011  . Pes planus 03/01/2011  . Right knee pain 03/01/2011  . Greater trochanteric bursitis of left hip 03/01/2011  . Nonallopathic lesion of lumbosacral region 01/27/2011  . Obesity 12/29/2010  . Fatigue 12/29/2010  . Allergic rhinitis, cause unspecified   . Anxiety   . Depression   . Chronic pain   . GERD (gastroesophageal reflux disease)   . HTN (hypertension)   . Sleep apnea    Past Medical History  Diagnosis Date  . Allergic rhinitis, cause unspecified   . Anxiety   . Depression   .  Chronic pain   . GERD (gastroesophageal reflux disease)   . HTN (hypertension)   . Sleep apnea   . Migraines   . Abnormal uterine bleeding   . Dyspareunia   . Fibroid   . Urinary incontinence   . Kidney stones   . Bursitis of left shoulder     Past Surgical History  Procedure Laterality Date  . Umbilical hernia repair  2010  . Carpal tunnel release  1998  . Dental implants  2010  . Esophageal dilation  2010  . Hammer toe surgery      rt  . Septoplasty    . Rotator cuff repair Right 09/2011    Dr. Tamera Punt  . Pelvic laparoscopy  1990  .  dilatation and curettage  2000    ALLERGIES: Cefixime; Celebrex; Levofloxacin; Penicillins; Adhesive; and Tetracyclines & related  Current Outpatient Prescriptions  Medication Sig Dispense Refill  . Ascorbic Acid (VITAMIN C) 500 MG CAPS Take 500 mg by mouth daily.  60 capsule  0  . Astaxanthin 4 MG CAPS Take 1 capsule by mouth daily.      . Cholecalciferol  (VITAMIN D3) 2000 UNITS TABS Take 2,000 Units by mouth 2 (two) times daily.      . cyanocobalamin (,VITAMIN B-12,) 1000 MCG/ML injection Do an injection weekly for the first 4 weeks then monthly thereafter.  10 mL  1  . Krill Oil CAPS Take by mouth.      . Needles & Syringes MISC 1 Can by Does not apply route once a week. Dispense meals for B12 IM injections please thank you  10 each  1  . OVER THE COUNTER MEDICATION Take 450 mg by mouth 2 (two) times daily. Tumeric  Take 1 tablet bid      . Ubiquinol 100 MG CAPS Take 1 capsule by mouth daily.       No current facility-administered medications for this visit.     ROS:  Pertinent items are noted in HPI.  SOCIAL HISTORY:  Married.   PHYSICAL EXAMINATION:    BP 170/80  Pulse 108  Ht 4' 11.75" (1.518 m)  Wt 226 lb 3.2 oz (102.604 kg)  BMI 44.53 kg/m2  LMP 01/16/2002   Wt Readings from Last 3 Encounters:  05/08/13 226 lb 3.2 oz (102.604 kg)  04/30/13 226 lb (102.513 kg)  03/28/13 224 lb (101.606 kg)     Ht Readings from Last 3 Encounters:  05/08/13 4' 11.75" (1.518 m)  06/11/12 5' (1.524 m)  05/06/12 5' (1.524 m)    General appearance: alert, cooperative and appears stated age Head: Normocephalic, without obvious abnormality, atraumatic Neck: no adenopathy, supple, symmetrical, trachea midline and thyroid not enlarged, symmetric, no tenderness/mass/nodules Lungs: clear to auscultation bilaterally Breasts: Inspection negative, No nipple retraction or dimpling, No nipple discharge or bleeding, No axillary or supraclavicular adenopathy, Normal to palpation without dominant masses Heart: regular rate and rhythm Abdomen: obese, soft, non-tender; no masses,  no organomegaly Extremities: extremities normal, atraumatic, no cyanosis or edema Skin: Skin color, texture, turgor normal. No rashes or lesions Lymph nodes: Cervical, supraclavicular, and axillary nodes normal. No abnormal inguinal nodes palpated Neurologic: Grossly  normal  Pelvic: External genitalia:  no lesions              Urethra:  normal appearing urethra with no masses, tenderness or lesions              Bartholins and Skenes: normal  Vagina: normal appearing vagina with normal color and discharge, no lesions              Cervix: normal appearance              Pap and high risk HPV testing done: no.            Bimanual Exam:  Uterus:  uterus is normal size, shape, consistency and nontender.  (Exam limited by body habitus.)                                      Adnexa: normal adnexa in size, nontender and no masses                                      Rectovaginal: Confirms                                      Anus:  normal sphincter tone, no lesions  ASSESSMENT  Normal gynecologic exam. Family history of ovarian cancer.   History of fibroids.  Recent diagnosis of nephrolithiasis with normal uterus and adenxa on CT scan.  Extreme Obesity.   PLAN  Mammogram recommended yearly. [facility].  Patient will schedule. Patient will do bone density.  [facility] placed for [facility].  Patient will schedule.  Pap smear and high risk HPV testing not performed.  i recommend yearly ultrasound to check reproductive anatomy due to limited pelvic exam. Counseled on self breast exam, Calcium and vitamin D intake, exercise. Return annually or prn   An After Visit Summary was printed and given to the patient.

## 2013-05-16 ENCOUNTER — Telehealth: Payer: Self-pay | Admitting: Obstetrics and Gynecology

## 2013-05-16 NOTE — Telephone Encounter (Signed)
Left message for pt to call back. Dr Quincy Simmonds has reviewed records and she can pick up. Records are in drawer for pick up.

## 2013-05-28 ENCOUNTER — Ambulatory Visit: Payer: BC Managed Care – PPO | Admitting: Family Medicine

## 2013-06-04 ENCOUNTER — Encounter: Payer: Self-pay | Admitting: Family Medicine

## 2013-06-04 ENCOUNTER — Ambulatory Visit (INDEPENDENT_AMBULATORY_CARE_PROVIDER_SITE_OTHER): Payer: BC Managed Care – PPO | Admitting: Family Medicine

## 2013-06-04 VITALS — BP 162/94 | HR 111 | Ht 60.0 in | Wt 226.0 lb

## 2013-06-04 DIAGNOSIS — M9981 Other biomechanical lesions of cervical region: Secondary | ICD-10-CM

## 2013-06-04 DIAGNOSIS — M999 Biomechanical lesion, unspecified: Secondary | ICD-10-CM

## 2013-06-04 DIAGNOSIS — M25519 Pain in unspecified shoulder: Secondary | ICD-10-CM

## 2013-06-04 DIAGNOSIS — M25512 Pain in left shoulder: Secondary | ICD-10-CM

## 2013-06-04 DIAGNOSIS — G8929 Other chronic pain: Secondary | ICD-10-CM

## 2013-06-04 NOTE — Assessment & Plan Note (Signed)
Decision today to treat with OMT was based on Physical Exam  After verbal consent patient was treated with HVLA, ME techniques in cervical thoracic, lumbar and sacral areas  Patient tolerated the procedure well with improvement in symptoms  Patient given exercises, stretches and lifestyle modifications  See medications in patient instructions if given  Patient will follow up in 4 weeks       

## 2013-06-04 NOTE — Assessment & Plan Note (Signed)
Patient's chronic pain has been responding very well to osteopathic manipulation. Patient did feel significantly better after manipulation today. Patient will continue with the home exercises and continue to monitor her weight. Patient will come back again in 4 weeks for further evaluation and treatment.  Spent greater than 25 minutes with patient face-to-face and had greater than 50% of counseling including as described above in assessment and plan.

## 2013-06-04 NOTE — Progress Notes (Signed)
CC: Follow  Up and left shoulder pain.    Patient is here following up for her left shoulder pain. Patient has not been able to do any exercises because she's been playing with her new brace on her knee. Patient states that she's had difficulty that the braces been causing pain. Patient states that she has not been able to angulate regularly secondary to the pain that she is having with the brace. This caused her to be having more trouble with many of her other problems and she feels that she is out of alignment. Patient also had a broken dental implant. Patient was in a dental chair for greater than 2 hours in his care that she had metal toxicity. She thinks discuss her to have more aches and pains overall. Patient also like manipulation.   Blood pressure 162/94, pulse 111, height 5' (1.524 m), weight 226 lb (102.513 kg), last menstrual period 01/16/2002, SpO2 95.00%. General: No apparent distress alert and oriented x3 mood and affect normal Respiratory: Patient's recent full sentences and does not appear short of breath  Skin: Warm dry intact with no signs of infection or rash  Neuro: Cranial nerves II through XII are intact, neurovascularly intact in all extremities with 2+ DTRs and 2+ pulses.   shoulder + impingement but full passive ROM, good grip strength and full RTC strength compared to surgically repaired contralateral side.  NVI distally. No change from previous exam.    OMT findings  Cervical C2 flexed rotated and side bent left C3 flexed rotated and side bent right C7 extended rotated and side bent left  thoracic  T1 E RS ight with elevated first rib.  T7 extended rotated inside that right  Other findings  L1 F RS left  Sacrum left on left

## 2013-06-04 NOTE — Assessment & Plan Note (Signed)
Patient continued to have left shoulder pain. Patient does have subacromial bursitis as well as underlying osteoarthritis. Patient does have a rotator cuff intact based on her strength testing today. Patient though will restart her exercises and hopefully will make some improvement. Patient will continue the over-the-counter medications we discussed previously as well as the icing protocol. Patient come back in 4 weeks if we continue to have pain we can consider an intra-articular injection.

## 2013-06-04 NOTE — Patient Instructions (Addendum)
For everything that happened you are doing great,.  Try the brace again but start slow 1 hour first day and increase by 1 hour daily. If pain occurs again.  Go back one step on duration and make sure position is correct.  Continue exercisers for your shoulder. 3 times a week. Lets say  Tommy copper or copper fit that may work.  Try charcoal tabs 2 days before and 2 days after dental appointment and see if that helps.  Sed me the metal testing material again.,  Come back in 4 weeks.

## 2013-06-25 ENCOUNTER — Encounter: Payer: Self-pay | Admitting: Family Medicine

## 2013-07-02 ENCOUNTER — Ambulatory Visit (INDEPENDENT_AMBULATORY_CARE_PROVIDER_SITE_OTHER): Payer: BC Managed Care – PPO | Admitting: Family Medicine

## 2013-07-02 ENCOUNTER — Encounter: Payer: Self-pay | Admitting: Family Medicine

## 2013-07-02 VITALS — BP 132/82 | HR 100 | Ht 60.0 in | Wt 223.0 lb

## 2013-07-02 DIAGNOSIS — G8929 Other chronic pain: Secondary | ICD-10-CM

## 2013-07-02 DIAGNOSIS — M999 Biomechanical lesion, unspecified: Secondary | ICD-10-CM

## 2013-07-02 DIAGNOSIS — M25519 Pain in unspecified shoulder: Secondary | ICD-10-CM

## 2013-07-02 DIAGNOSIS — M25512 Pain in left shoulder: Secondary | ICD-10-CM

## 2013-07-02 DIAGNOSIS — M9981 Other biomechanical lesions of cervical region: Secondary | ICD-10-CM

## 2013-07-02 NOTE — Assessment & Plan Note (Signed)
Injection given today. Patient likely does have osteoarthritic changes as well as a rotator cuff injury but I didn't notice patient had significant decrease in pain after the injection. Patient will continue with home exercises and follow up again in 3-4 weeks.  Status to

## 2013-07-02 NOTE — Assessment & Plan Note (Signed)
Decision today to treat with OMT was based on Physical Exam  After verbal consent patient was treated with HVLA, ME techniques in cervical thoracic, lumbar and sacral areas  Patient tolerated the procedure well with improvement in symptoms  Patient given exercises, stretches and lifestyle modifications  See medications in patient instructions if given  Patient will follow up in 4 weeks

## 2013-07-02 NOTE — Progress Notes (Signed)
CC: Follow  Up and left shoulder pain and chronic back pain.     Patient is here following up for her left shoulder pain. Patient states that her left shoulder pain is only made about a 10-20% improvement. Patient states that overall he continues to give her pain especially with certain movements. Patient states it has awakened her up at night.  Patient is also here for a manipulation. Patient states overall she has been doing fairly well. Patient is looking into metal testing to see if any of her aches and pains could be is to do a sensitivity. Patient brought up the possibility of mold has been a concern as well. Patient states though that aches and pains seem to be the same ones that she's had previously. Patient is not wearing the knee brace for her severe osteoarthritis because of difficulty with applying the brace. States that the brace actually can cause pain  Blood pressure 132/82, pulse 100, height 5' (1.524 m), weight 223 lb (101.152 kg), last menstrual period 01/16/2002, SpO2 97.00%. General: No apparent distress alert and oriented x3 mood and affect normal Respiratory: Patient's recent full sentences and does not appear short of breath  Skin: Warm dry intact with no signs of infection or rash  Neuro: Cranial nerves II through XII are intact, neurovascularly intact in all extremities with 2+ DTRs and 2+ pulses.   shoulder + impingement but full passive ROM, good grip strength and full RTC strength compared to surgically repaired contralateral side.  NVI distally. No change from previous exam.    OMT findings  Cervical C2 flexed rotated and side bent left C6 extended rotated and side bent left  thoracic  T1 E RS ight with elevated first rib.  T7 extended rotated inside that right  Other findings  L1 F RS left  Sacrum left on left    Procedure: Real-time Ultrasound Guided Injection of left glenohumeral joint Device: GE Logiq E  Ultrasound guided injection is preferred based  studies that show increased duration, increased effect, greater accuracy, decreased procedural pain, increased response rate with ultrasound guided versus blind injection.  Verbal informed consent obtained.  Time-out conducted.  Noted no overlying erythema, induration, or other signs of local infection.  Skin prepped in a sterile fashion.  Local anesthesia: Topical Ethyl chloride.  With sterile technique and under real time ultrasound guidance:  Joint visualized.  23g 1  inch needle inserted posterior approach. Pictures taken for needle placement. Patient did have injection of 2 cc of 1% lidocaine, 2 cc of 0.5% Marcaine, and 1cc of Kenalog 40 mg/dL. Completed without difficulty  Pain immediately resolved suggesting accurate placement of the medication.  Advised to call if fevers/chills, erythema, induration, drainage, or persistent bleeding.  Images permanently stored and available for review in the ultrasound unit.  Impression: Technically successful ultrasound guided injection.

## 2013-07-02 NOTE — Patient Instructions (Signed)
Good to see you as always.  Ice will always be your friend. Send the knee brace back then.  Consider the metal testing. You can send me the info Continue the exercises cause you are doing great Your shoulder we did an injection today, take it easy for today, and in 6 hours ice it.  If knee brace hurts send it back Come back in 4-6 weeks or if knee gets worse.

## 2013-07-02 NOTE — Assessment & Plan Note (Signed)
And once again discussed in great detail. Patient is going to get metal testing in all sites facility to keep it cheaper. We discussed the possibility of other topics and that could be contributing. Patient will try different cleaning products at home first to see if this makes any improvement. Patient continues to respond very well to osteopathic manipulation. Patient and will come back again in 4 weeks for further evaluation.  Spent greater than 25 minutes with patient face-to-face and had greater than 50% of counseling including as described above in assessment and plan.

## 2013-08-05 ENCOUNTER — Encounter: Payer: Self-pay | Admitting: Family Medicine

## 2013-08-06 ENCOUNTER — Encounter: Payer: Self-pay | Admitting: Family Medicine

## 2013-08-06 ENCOUNTER — Ambulatory Visit (INDEPENDENT_AMBULATORY_CARE_PROVIDER_SITE_OTHER): Payer: BC Managed Care – PPO | Admitting: Family Medicine

## 2013-08-06 VITALS — BP 134/80 | HR 87 | Ht 60.0 in | Wt 226.0 lb

## 2013-08-06 DIAGNOSIS — M25512 Pain in left shoulder: Secondary | ICD-10-CM

## 2013-08-06 DIAGNOSIS — M9981 Other biomechanical lesions of cervical region: Secondary | ICD-10-CM

## 2013-08-06 DIAGNOSIS — M25519 Pain in unspecified shoulder: Secondary | ICD-10-CM

## 2013-08-06 DIAGNOSIS — M999 Biomechanical lesion, unspecified: Secondary | ICD-10-CM

## 2013-08-06 DIAGNOSIS — G8929 Other chronic pain: Secondary | ICD-10-CM

## 2013-08-06 NOTE — Progress Notes (Signed)
CC: Follow  Up and left shoulder pain and chronic back pain.     Patient continues to have left shoulder pain but after injection was doing significantly better. Patient unfortunately had to go under a cystoscope in after that she noticed some more pain. Slowly has started to improve.   Patient is also here for a manipulation. Patient states overall she has been doing fairly well. Patient continues to have some difficulty with chronic aches and pains. Patient is actually going to be seen in integrated medicine doctor Dr. Sharol Roussel in the near future. I think that this will be very beneficial. Patient was discussed and the possibility of metal testing in the think she'll get better evaluation there. Patient states there is no new pain to seems to be recurrent pain of the lower back.  Blood pressure 134/80, pulse 87, height 5' (1.524 m), weight 226 lb (102.513 kg), last menstrual period 01/16/2002, SpO2 96.00%. General: No apparent distress alert and oriented x3 mood and affect normal Respiratory: Patient's recent full sentences and does not appear short of breath  Skin: Warm dry intact with no signs of infection or rash  Neuro: Cranial nerves II through XII are intact, neurovascularly intact in all extremities with 2+ DTRs and 2+ pulses.   Shoulder actually has more range of motion and improvement from previous exam. Still mild positive impingement signs.    OMT findings  Cervical C2 flexed rotated and side bent left C6 extended rotated and side bent left  thoracic  T1 E RS ight with elevated first rib.  T7 extended rotated and side bent  right  L1 F RS left  Sacrum left on left

## 2013-08-06 NOTE — Assessment & Plan Note (Signed)
Patient has had difficulty with chronic pain over all. We have done a significant amount of different testing she's had has previously. Patient has had seronegative testing of ANA and CCP previously. Patient likely though does have some type or inflammatory response. I do feel that integrated medicine could be beneficial for patient. Differential continues to be slow metabolism of chronic medications and other environmental factors as well as metal toxicity with patient having well water. Patient continues to improve almost every time I see her with osteopathic manipulation as well as over-the-counter supplementation to decrease inflammation. We'll continue with current regimen and have patient come back again in 6-8 weeks for further evaluation and treatment.  Spent greater than 25 minutes with patient face-to-face and had greater than 50% of counseling including as described above in assessment and plan.

## 2013-08-06 NOTE — Assessment & Plan Note (Signed)
Patient's left shoulder pain is improved. Patient actually had more increasing range of motion. The patient actually had more of a chronic pain than true shoulder discomfort. Patient did respond well to the osteopathic manipulation.

## 2013-08-06 NOTE — Patient Instructions (Signed)
Good to see you Continue what you are doing.  I think the anesthesia is playing a little number on you but will get better over the next 2 weeks.  I would restart the turmeric.  I could not find any information about that affecting your kidneys.  Look up oxalic acid containing foods.  No more than 5000 IU of Vitmain D due to your kidneys Consider the diclofenac I gave you and we may try nabumetone.  Dr Sharol Roussel will be good to see you.  Come back in 6 weeks.

## 2013-08-06 NOTE — Assessment & Plan Note (Signed)
Decision today to treat with OMT was based on Physical Exam  After verbal consent patient was treated with HVLA, ME techniques in cervical thoracic, lumbar and sacral areas  Patient tolerated the procedure well with improvement in symptoms  Patient given exercises, stretches and lifestyle modifications  See medications in patient instructions if given  Patient will follow up in 6-8  weeks

## 2013-08-15 ENCOUNTER — Other Ambulatory Visit: Payer: Self-pay | Admitting: Obstetrics and Gynecology

## 2013-08-15 DIAGNOSIS — E2839 Other primary ovarian failure: Secondary | ICD-10-CM

## 2013-08-15 DIAGNOSIS — Z1231 Encounter for screening mammogram for malignant neoplasm of breast: Secondary | ICD-10-CM

## 2013-09-10 ENCOUNTER — Ambulatory Visit
Admission: RE | Admit: 2013-09-10 | Discharge: 2013-09-10 | Disposition: A | Discharge status: Home / Self Care | Payer: BC Managed Care – PPO | Source: Ambulatory Visit | Attending: Obstetrics and Gynecology | Admitting: Obstetrics and Gynecology

## 2013-09-10 DIAGNOSIS — E2839 Other primary ovarian failure: Secondary | ICD-10-CM

## 2013-09-10 DIAGNOSIS — Z1231 Encounter for screening mammogram for malignant neoplasm of breast: Secondary | ICD-10-CM

## 2013-10-21 ENCOUNTER — Ambulatory Visit (INDEPENDENT_AMBULATORY_CARE_PROVIDER_SITE_OTHER)
Admission: RE | Admit: 2013-10-21 | Discharge: 2013-10-21 | Disposition: A | Discharge status: Home / Self Care | Payer: Medicare Other | Source: Ambulatory Visit | Attending: Family Medicine | Admitting: Family Medicine

## 2013-10-21 ENCOUNTER — Ambulatory Visit (INDEPENDENT_AMBULATORY_CARE_PROVIDER_SITE_OTHER): Payer: Medicare Other | Admitting: Family Medicine

## 2013-10-21 ENCOUNTER — Encounter: Payer: Self-pay | Admitting: Family Medicine

## 2013-10-21 VITALS — BP 142/72 | HR 98 | Ht 60.0 in | Wt 217.0 lb

## 2013-10-21 DIAGNOSIS — M25512 Pain in left shoulder: Secondary | ICD-10-CM

## 2013-10-21 DIAGNOSIS — M545 Low back pain, unspecified: Secondary | ICD-10-CM

## 2013-10-21 DIAGNOSIS — M9903 Segmental and somatic dysfunction of lumbar region: Secondary | ICD-10-CM

## 2013-10-21 DIAGNOSIS — M9908 Segmental and somatic dysfunction of rib cage: Secondary | ICD-10-CM | POA: Diagnosis not present

## 2013-10-21 DIAGNOSIS — M19012 Primary osteoarthritis, left shoulder: Secondary | ICD-10-CM | POA: Diagnosis not present

## 2013-10-21 DIAGNOSIS — M9901 Segmental and somatic dysfunction of cervical region: Secondary | ICD-10-CM | POA: Diagnosis not present

## 2013-10-21 DIAGNOSIS — M999 Biomechanical lesion, unspecified: Secondary | ICD-10-CM

## 2013-10-21 MED ORDER — DOXYCYCLINE HYCLATE 100 MG PO TABS: 100.0000 mg | ORAL_TABLET | Freq: Two times a day (BID) | ORAL | Status: AC

## 2013-10-21 MED ORDER — FLUCONAZOLE 200 MG PO TABS: 200.0000 mg | ORAL_TABLET | Freq: Once | ORAL | Status: DC

## 2013-10-21 NOTE — Progress Notes (Signed)
CC: Follow  Up and left shoulder pain and chronic back pain.     Patient continues to have left shoulder pain patient was seen previously for this adnexa had an injection quite some time ago. Patient was doing significantly better but then after having anesthesia she states that the pain has gotten significantly worse. Patient states that it is unfortunately feeling like her contralateral arm when she had a rotator cuff tear. Patient states that she is noticing some mild weakness and has pain almost all the time. Patient is very concerned.  Patient is also here for a manipulation. Patient states overall she has been doing fairly well. Patient continues to have some difficulty with chronic aches and pains. Patient was to start integrated medicine but unfortunately had to change the appointment. Patient also has had recent cold that seems to have increased her chronic pain.  Blood pressure 142/72, pulse 98, height 5' (1.524 m), weight 217 lb (98.431 kg), last menstrual period 01/16/2002, SpO2 97.00%. General: No apparent distress alert and oriented x3 mood and affect normal Respiratory: Patient's recent full sentences and does not appear short of breath  Skin: Warm dry intact with no signs of infection or rash  Neuro: Cranial nerves II through XII are intact, neurovascularly intact in all extremities with 2+ DTRs and 2+ pulses.  Shoulder: Left Inspection reveals no abnormalities, atrophy or asymmetry. Palpation is normal with no tenderness over AC joint or bicipital groove. Decreased range of motion with forward flexion of 170, internal rotation to sacrum, external rotation to 10. Rotator cuff strength 4/5 compared to 5 out of 5 on the contralateral side No signs of impingement with negative Neer and Hawkin's tests, empty can sign. Speeds and Yergason's tests normal. No labral pathology noted with negative Obrien's, negative clunk and good stability. Normal scapular function observed. Positive  painful arc No apprehension sign       OMT findings  Cervical C2 flexed rotated and side bent left C6 extended rotated and side bent left  thoracic  T1 E RS ight with elevated first rib.  T7 extended rotated and side bent  right  L1 F RS left  Sacrum left on left

## 2013-10-21 NOTE — Patient Instructions (Addendum)
Good to see you I will try to get the problem list fix.  Ice still 20 minutes  We will get xray of left shoulder today and we will schedule MRI. I will release info when I get it.  Call me if not better in 2 days start antibiotic.  I think some aches and pain are form the cold.  For your heel, look at handout.  Avoid being barefoot at all times.  On step go up on toes and down slow 30 reps daily.  Ice bath 20 minutes at night (sorry) Can try pennsaid if it helps.  Hamstring exercises 3 times a week for the knee.  Come back in 3 weeks

## 2013-10-22 ENCOUNTER — Ambulatory Visit
Admission: RE | Admit: 2013-10-22 | Discharge: 2013-10-22 | Disposition: A | Discharge status: Home / Self Care | Payer: Medicare Other | Source: Ambulatory Visit | Attending: Family Medicine | Admitting: Family Medicine

## 2013-10-22 DIAGNOSIS — M75102 Unspecified rotator cuff tear or rupture of left shoulder, not specified as traumatic: Secondary | ICD-10-CM | POA: Diagnosis not present

## 2013-10-22 DIAGNOSIS — M25512 Pain in left shoulder: Secondary | ICD-10-CM

## 2013-10-22 DIAGNOSIS — M71812 Other specified bursopathies, left shoulder: Secondary | ICD-10-CM | POA: Diagnosis not present

## 2013-10-22 DIAGNOSIS — M19012 Primary osteoarthritis, left shoulder: Secondary | ICD-10-CM | POA: Diagnosis not present

## 2013-10-22 NOTE — Assessment & Plan Note (Signed)
Decision today to treat with OMT was based on Physical Exam  After verbal consent patient was treated with HVLA, ME techniques in cervical thoracic, lumbar and sacral areas  Patient tolerated the procedure well with improvement in symptoms  Patient given exercises, stretches and lifestyle modifications  See medications in patient instructions if given  Patient will follow up in 3-4  weeks

## 2013-10-22 NOTE — Assessment & Plan Note (Signed)
She will continue with the low back pain and continue with conservative therapy. We have done a massive workup previously looking for any other signs. Patient has made some benefit with aligning patient's feet and we encourage weight loss which patient has had significant difficulty with. Patient likes to avoid any prescription medications whenever possible. We will continue with the regimen of osteopathic manipulation as well as home exercise program and conservative therapy. Patient will come back and see me again in 3-4 weeks for further evaluation and treatment.

## 2013-10-22 NOTE — Assessment & Plan Note (Signed)
Patient is having left shoulder pain and is having some mild weakness this time. I am going to get x-rays and likely is going show moderate osteophytic changes but I am concerned for more of a rotator cuff tear and an MRI has been ordered as well. Patient had one on the contralateral side which does increase patient's risk of having another one on this side. We discussed doing an icing regimen avoiding any heavy lifting until the results are known. Patient will come back one to 2 days after the MRI and we'll discuss that further followup.

## 2013-10-24 ENCOUNTER — Encounter: Payer: Self-pay | Admitting: Family Medicine

## 2013-10-29 DIAGNOSIS — M1711 Unilateral primary osteoarthritis, right knee: Secondary | ICD-10-CM | POA: Diagnosis not present

## 2013-10-29 DIAGNOSIS — M75122 Complete rotator cuff tear or rupture of left shoulder, not specified as traumatic: Secondary | ICD-10-CM | POA: Diagnosis not present

## 2013-11-04 DIAGNOSIS — N318 Other neuromuscular dysfunction of bladder: Secondary | ICD-10-CM | POA: Diagnosis not present

## 2013-11-04 DIAGNOSIS — G4733 Obstructive sleep apnea (adult) (pediatric): Secondary | ICD-10-CM | POA: Diagnosis not present

## 2013-11-04 DIAGNOSIS — R0602 Shortness of breath: Secondary | ICD-10-CM | POA: Diagnosis not present

## 2013-11-04 DIAGNOSIS — R002 Palpitations: Secondary | ICD-10-CM | POA: Diagnosis not present

## 2013-11-04 DIAGNOSIS — R072 Precordial pain: Secondary | ICD-10-CM | POA: Diagnosis not present

## 2013-11-04 DIAGNOSIS — R109 Unspecified abdominal pain: Secondary | ICD-10-CM | POA: Diagnosis not present

## 2013-11-04 DIAGNOSIS — N393 Stress incontinence (female) (male): Secondary | ICD-10-CM | POA: Diagnosis not present

## 2013-11-04 DIAGNOSIS — N3090 Cystitis, unspecified without hematuria: Secondary | ICD-10-CM | POA: Diagnosis not present

## 2013-11-04 DIAGNOSIS — E785 Hyperlipidemia, unspecified: Secondary | ICD-10-CM | POA: Diagnosis not present

## 2013-11-05 DIAGNOSIS — R002 Palpitations: Secondary | ICD-10-CM | POA: Diagnosis not present

## 2013-11-05 DIAGNOSIS — R0602 Shortness of breath: Secondary | ICD-10-CM | POA: Diagnosis not present

## 2013-11-05 DIAGNOSIS — R079 Chest pain, unspecified: Secondary | ICD-10-CM | POA: Diagnosis not present

## 2013-11-07 DIAGNOSIS — R072 Precordial pain: Secondary | ICD-10-CM | POA: Diagnosis not present

## 2013-11-07 DIAGNOSIS — R0602 Shortness of breath: Secondary | ICD-10-CM | POA: Diagnosis not present

## 2013-11-10 ENCOUNTER — Ambulatory Visit (INDEPENDENT_AMBULATORY_CARE_PROVIDER_SITE_OTHER): Payer: Medicare Other | Admitting: Family Medicine

## 2013-11-10 ENCOUNTER — Encounter: Payer: Self-pay | Admitting: Family Medicine

## 2013-11-10 VITALS — BP 114/72 | HR 95 | Wt 218.0 lb

## 2013-11-10 DIAGNOSIS — M545 Low back pain, unspecified: Secondary | ICD-10-CM

## 2013-11-10 DIAGNOSIS — M9908 Segmental and somatic dysfunction of rib cage: Secondary | ICD-10-CM | POA: Diagnosis not present

## 2013-11-10 DIAGNOSIS — M9903 Segmental and somatic dysfunction of lumbar region: Secondary | ICD-10-CM | POA: Diagnosis not present

## 2013-11-10 DIAGNOSIS — M9901 Segmental and somatic dysfunction of cervical region: Secondary | ICD-10-CM | POA: Diagnosis not present

## 2013-11-10 DIAGNOSIS — M999 Biomechanical lesion, unspecified: Secondary | ICD-10-CM

## 2013-11-10 NOTE — Progress Notes (Signed)
CC: Follow  Up and left shoulder pain and chronic back pain.     Patient continues to have left shoulder pain, patient had an MRI of her left shoulder that that show full-thickness rotator cuff tear. Patient is scheduled to have surgery tomorrow.  Patient is also here for a manipulation. Patient states overall she has been doing fairly well. Patient continues to have some difficulty with chronic aches and pains. Denies any new symptoms. Continues to have the same symptoms mostly of her lower back as well as upper back and neck.  Blood pressure 114/72, pulse 95, weight 218 lb (98.884 kg), last menstrual period 01/16/2002, SpO2 96.00%. General: No apparent distress alert and oriented x3 mood and affect normal Respiratory: Patient's recent full sentences and does not appear short of breath  Skin: Warm dry intact with no signs of infection or rash  Neuro: Cranial nerves II through XII are intact, neurovascularly intact in all extremities with 2+ DTRs and 2+ pulses.  Shoulder: Left Inspection reveals no abnormalities, atrophy or asymmetry. Palpation is normal with no tenderness over AC joint or bicipital groove. Decreased range of motion with forward flexion of 150, internal rotation to sacrum, external rotation to 10. Rotator cuff strength 4/5 compared to 5 out of 5 on the contralateral side. No signs of impingement with negative Neer and Hawkin's tests, empty can sign. Speeds and Yergason's tests normal. No labral pathology noted with negative Obrien's, negative clunk and good stability. Normal scapular function observed. Positive painful arc No apprehension sign    OMT findings  Cervical C2 flexed rotated and side bent left C5 extended rotated and side bent left  thoracic  T1 E RS ight with elevated first rib.  T6 extended rotated and side bent  right  L2 F RS left  Sacrum left on left

## 2013-11-10 NOTE — Assessment & Plan Note (Signed)
Patient does have chronic low back pain and does have some moderate osteophytic changes. Patient encouraged to do home exercises as well as icing protocol. We discussed range of motion exercises which I think would be beneficial. We discussed the importance of weight loss. Patient though is going to have surgery here and will avoid any significant lifting sometimes which might help her back. Patient will come back and see me again in 3 weeks. Patient did have an exacerbation after her last rotator cuff repair ticketed and secondary to the anesthesia the patient may have to come back at an earlier date.

## 2013-11-10 NOTE — Patient Instructions (Signed)
Good to see you Good luck! I am here if you need me.  We cna discuss knee when feeling better.  Might be good to see you in 3 weeks.

## 2013-11-10 NOTE — Assessment & Plan Note (Signed)
Decision today to treat with OMT was based on Physical Exam  After verbal consent patient was treated with HVLA, ME techniques in cervical thoracic, lumbar and sacral areas  Patient tolerated the procedure well with improvement in symptoms  Patient given exercises, stretches and lifestyle modifications  See medications in patient instructions if given  Patient will follow up in 3-4  weeks

## 2013-11-11 DIAGNOSIS — M7522 Bicipital tendinitis, left shoulder: Secondary | ICD-10-CM | POA: Diagnosis not present

## 2013-11-11 DIAGNOSIS — M75102 Unspecified rotator cuff tear or rupture of left shoulder, not specified as traumatic: Secondary | ICD-10-CM | POA: Diagnosis not present

## 2013-11-11 DIAGNOSIS — M75122 Complete rotator cuff tear or rupture of left shoulder, not specified as traumatic: Secondary | ICD-10-CM | POA: Diagnosis not present

## 2013-11-11 DIAGNOSIS — M7542 Impingement syndrome of left shoulder: Secondary | ICD-10-CM | POA: Diagnosis not present

## 2013-11-11 DIAGNOSIS — M66812 Spontaneous rupture of other tendons, left shoulder: Secondary | ICD-10-CM | POA: Diagnosis not present

## 2013-11-11 DIAGNOSIS — G8918 Other acute postprocedural pain: Secondary | ICD-10-CM | POA: Diagnosis not present

## 2013-11-11 HISTORY — PX: ROTATOR CUFF REPAIR: SHX139

## 2013-11-12 ENCOUNTER — Ambulatory Visit: Payer: Medicare Other | Admitting: Family Medicine

## 2013-11-17 ENCOUNTER — Encounter: Payer: Self-pay | Admitting: Family Medicine

## 2013-12-02 DIAGNOSIS — E785 Hyperlipidemia, unspecified: Secondary | ICD-10-CM | POA: Diagnosis not present

## 2013-12-02 DIAGNOSIS — R002 Palpitations: Secondary | ICD-10-CM | POA: Diagnosis not present

## 2013-12-02 DIAGNOSIS — R072 Precordial pain: Secondary | ICD-10-CM | POA: Diagnosis not present

## 2013-12-02 DIAGNOSIS — G4733 Obstructive sleep apnea (adult) (pediatric): Secondary | ICD-10-CM | POA: Diagnosis not present

## 2013-12-03 ENCOUNTER — Ambulatory Visit: Payer: Medicare Other | Admitting: Family Medicine

## 2013-12-05 ENCOUNTER — Ambulatory Visit: Payer: Medicare Other | Admitting: Family Medicine

## 2013-12-16 ENCOUNTER — Encounter: Payer: Self-pay | Admitting: Family Medicine

## 2013-12-16 ENCOUNTER — Ambulatory Visit (INDEPENDENT_AMBULATORY_CARE_PROVIDER_SITE_OTHER): Payer: Medicare Other | Admitting: Family Medicine

## 2013-12-16 VITALS — BP 128/76 | HR 85 | Ht 60.0 in | Wt 226.0 lb

## 2013-12-16 DIAGNOSIS — M9901 Segmental and somatic dysfunction of cervical region: Secondary | ICD-10-CM | POA: Diagnosis not present

## 2013-12-16 DIAGNOSIS — G8929 Other chronic pain: Secondary | ICD-10-CM

## 2013-12-16 DIAGNOSIS — M999 Biomechanical lesion, unspecified: Secondary | ICD-10-CM

## 2013-12-16 DIAGNOSIS — M67911 Unspecified disorder of synovium and tendon, right shoulder: Secondary | ICD-10-CM

## 2013-12-16 NOTE — Assessment & Plan Note (Signed)
Decision today to treat with OMT was based on Physical Exam  After verbal consent patient was treated with , ME, soft tissue techniques in cervical thoracic, lumbar and sacral areas  Patient tolerated the procedure well with improvement in symptoms  Patient given exercises, stretches and lifestyle modifications  See medications in patient instructions if given  Patient will follow up in 3  weeks

## 2013-12-16 NOTE — Patient Instructions (Signed)
Good to see you Just to make sure you are doing well lets see you again in 3 weeks.  Good luck with physical therapy you will do great!!!

## 2013-12-16 NOTE — Progress Notes (Signed)
CC: Follow  Up and left shoulder pain and chronic back pain.    Patient did have a rotator cuff tear and did have surgery. Patient is now 6 weeks since surgery. Patient is starting formal physical therapy next week. Overall she seems to be doing well.  Patient is also here for a manipulation. Patient states overall she has been doing fairly well. Patient continues to have some difficulty with chronic aches and pains. Patient states with her not doing as much activity and has gained weight secondary to this she started having more up her back and neck pain.  Blood pressure 128/76, pulse 85, height 5' (1.524 m), weight 226 lb (102.513 kg), last menstrual period 01/16/2002, SpO2 94 %. General: No apparent distress alert and oriented x3 mood and affect normal Respiratory: Patient's recent full sentences and does not appear short of breath  Skin: Warm dry intact with no signs of infection or rash  Neuro: Cranial nerves II through XII are intact, neurovascularly intact in all extremities with 2+ DTRs and 2+ pulses.  Left shoulder in a sling    OMT findings  Cervical C2 flexed rotated and side bent left C5 extended rotated and side bent left  thoracic  T1 E RS right  T6 extended rotated and side bent  right

## 2013-12-16 NOTE — Assessment & Plan Note (Signed)
Patient overall is continuing to respond fairly well. Patient continues with manipulation. With patient's starting to increase her activity again I would like to see her on a more frequent basis. Encourage her to continue the other over-the-counter medications. Patient and will come back and see me again in 2-3 weeks for further evaluation.

## 2013-12-25 DIAGNOSIS — M6281 Muscle weakness (generalized): Secondary | ICD-10-CM | POA: Diagnosis not present

## 2013-12-25 DIAGNOSIS — M25612 Stiffness of left shoulder, not elsewhere classified: Secondary | ICD-10-CM | POA: Diagnosis not present

## 2013-12-25 DIAGNOSIS — M75122 Complete rotator cuff tear or rupture of left shoulder, not specified as traumatic: Secondary | ICD-10-CM | POA: Diagnosis not present

## 2013-12-25 DIAGNOSIS — M25512 Pain in left shoulder: Secondary | ICD-10-CM | POA: Diagnosis not present

## 2013-12-29 DIAGNOSIS — M75122 Complete rotator cuff tear or rupture of left shoulder, not specified as traumatic: Secondary | ICD-10-CM | POA: Diagnosis not present

## 2013-12-29 DIAGNOSIS — M6281 Muscle weakness (generalized): Secondary | ICD-10-CM | POA: Diagnosis not present

## 2013-12-29 DIAGNOSIS — M25612 Stiffness of left shoulder, not elsewhere classified: Secondary | ICD-10-CM | POA: Diagnosis not present

## 2013-12-29 DIAGNOSIS — M25512 Pain in left shoulder: Secondary | ICD-10-CM | POA: Diagnosis not present

## 2014-01-02 ENCOUNTER — Telehealth: Payer: Self-pay | Admitting: Obstetrics and Gynecology

## 2014-01-02 NOTE — Telephone Encounter (Signed)
Message left to return call to Zahira Brummond at [phone].    

## 2014-01-02 NOTE — Telephone Encounter (Signed)
Patient calling requesting an appointment for "lower right abdominal pain with back pain sometimes and diarrhea." She said her doctor told her to see her GYN. Please advise.

## 2014-01-05 NOTE — Telephone Encounter (Signed)
Spoke with patient. Denies complaints at this time. States she has intermittent recurrent RLQ abdominal pain. Last episode was 12/16-12/18. Patient saw her urologist and was advised to follow up with GI and GYN.  Patient had recent rotator cuff surgery and would like to come for an office visit on a day that she has PT as well.  Patient reports has a sister with ovarian CA and would like evaluation.  Patient scheduled for office visit with Dr. Quincy Simmonds for 01/07/14 at 0900. She will call back with any concerns prior to appointment. Routing to provider for final review. Patient agreeable to disposition. Will close encounter

## 2014-01-07 ENCOUNTER — Ambulatory Visit (INDEPENDENT_AMBULATORY_CARE_PROVIDER_SITE_OTHER): Payer: Medicare Other | Admitting: Obstetrics and Gynecology

## 2014-01-07 ENCOUNTER — Ambulatory Visit (INDEPENDENT_AMBULATORY_CARE_PROVIDER_SITE_OTHER): Payer: Medicare Other | Admitting: Family Medicine

## 2014-01-07 ENCOUNTER — Encounter: Payer: Self-pay | Admitting: Family Medicine

## 2014-01-07 ENCOUNTER — Encounter: Payer: Self-pay | Admitting: Obstetrics and Gynecology

## 2014-01-07 VITALS — BP 130/80 | HR 88 | Resp 16 | Ht 60.0 in | Wt 222.0 lb

## 2014-01-07 DIAGNOSIS — G8929 Other chronic pain: Secondary | ICD-10-CM

## 2014-01-07 DIAGNOSIS — M9903 Segmental and somatic dysfunction of lumbar region: Secondary | ICD-10-CM | POA: Diagnosis not present

## 2014-01-07 DIAGNOSIS — N3946 Mixed incontinence: Secondary | ICD-10-CM | POA: Diagnosis not present

## 2014-01-07 DIAGNOSIS — M9901 Segmental and somatic dysfunction of cervical region: Secondary | ICD-10-CM | POA: Diagnosis not present

## 2014-01-07 DIAGNOSIS — Z8041 Family history of malignant neoplasm of ovary: Secondary | ICD-10-CM

## 2014-01-07 DIAGNOSIS — M25612 Stiffness of left shoulder, not elsewhere classified: Secondary | ICD-10-CM | POA: Diagnosis not present

## 2014-01-07 DIAGNOSIS — M1711 Unilateral primary osteoarthritis, right knee: Secondary | ICD-10-CM

## 2014-01-07 DIAGNOSIS — R1031 Right lower quadrant pain: Secondary | ICD-10-CM | POA: Diagnosis not present

## 2014-01-07 DIAGNOSIS — M6281 Muscle weakness (generalized): Secondary | ICD-10-CM | POA: Diagnosis not present

## 2014-01-07 DIAGNOSIS — M75122 Complete rotator cuff tear or rupture of left shoulder, not specified as traumatic: Secondary | ICD-10-CM | POA: Diagnosis not present

## 2014-01-07 DIAGNOSIS — M999 Biomechanical lesion, unspecified: Secondary | ICD-10-CM

## 2014-01-07 DIAGNOSIS — M9908 Segmental and somatic dysfunction of rib cage: Secondary | ICD-10-CM

## 2014-01-07 DIAGNOSIS — M25512 Pain in left shoulder: Secondary | ICD-10-CM | POA: Diagnosis not present

## 2014-01-07 LAB — POCT URINALYSIS DIPSTICK
Bilirubin, UA: NEGATIVE
Blood, UA: NEGATIVE
Glucose, UA: NEGATIVE
KETONES UA: NEGATIVE
LEUKOCYTES UA: NEGATIVE
Nitrite, UA: NEGATIVE
PH UA: 5
Protein, UA: NEGATIVE
Urobilinogen, UA: NEGATIVE

## 2014-01-07 NOTE — Progress Notes (Signed)
GYNECOLOGY VISIT  PCP: Dr. Carlus Pavlov  Referring provider:   HPI: 63 y.o.   Married  Caucasian  female   G2P2002 with Patient's last menstrual period was 01/16/2002.   here for Abdominal Pain RLQ and back pain. Concerned about ovarian cancer.  Was told to see her gynecologist.   Episode of abdominal and back pain for 2 weeks. Had vomiting and liquid diarrhea.  Some pain but is much less severe than last week.  Still with some nausea.  Had low grade fever last week to about 100 F.   States she has food sensitivities and ate food unfamiliar to her.  Has gluten sensitivity.   Diagnosed with kidney stones in July.   History of uterine fibroids. Had pelvic ultrasound in May 2014.  Patient had a long history of right lower quadrant pain.   Sister with ovarian cancer.   Also has other questions.  She has ureteroscopy in July with urologist in [place].  Has difficulty controlling her urinary stream.  Cannot control her urine mid stream. Told to do Kegels by urology.  If has urgency and urinary incontinence, is unable to stop the stream.  Loses complete control with vomiting. Leaks urine if sitting and sneezes.  May leak if laughs or coughs but not really sure.  States she has a history of bladder spasms.   Recovering from shoulder surgery.  Doing physical therapy for her shoulder still.  Urine: negative urine dip.  GYNECOLOGIC HISTORY: Patient's last menstrual period was 01/16/2002. Sexually active: Not currently Partner preference: Female Contraception: Post-Menopausal   Menopausal hormone therapy: No DES exposure: No   Blood transfusions: No   Sexually transmitted diseases:  No  GYN procedures and prior surgeries: Pelvic Laparoscopy 1990  Last mammogram: 09/11/13 BIRADS1:neg                Last pap and high risk HPV testing: 05/06/12 Neg. HR HPV:Neg   History of abnormal pap smear: No    OB History    Gravida Para Term Preterm AB TAB SAB Ectopic Multiple Living    2 2 2       2        LIFESTYLE: Exercise: No             Tobacco: No Alcohol: No Drug use: No    Patient Active Problem List   Diagnosis Date Noted  . Nonallopathic lesion of cervical region 12/19/2012  . Fibroids 05/28/2012  . Family history of ovarian cancer 05/28/2012  . Strain of quadriceps muscle 04/17/2012  . Nonallopathic lesion of costovertebral region 01/23/2012  . Left shoulder pain 12/20/2011  . Rotator cuff disorder 09/26/2011  . Bilateral foot pain 09/20/2011  . Arthralgia of multiple joints 07/26/2011  . Arthritis of knee, degenerative 07/26/2011  . Arthritis of hand, degenerative 07/26/2011  . LBP (low back pain) 07/26/2011  . Vitamin B 12 deficiency 03/05/2011  . TMJ (temporomandibular joint syndrome) 03/05/2011  . Pes planus 03/01/2011  . Right knee pain 03/01/2011  . Greater trochanteric bursitis of left hip 03/01/2011  . Nonallopathic lesion of lumbosacral region 01/27/2011  . Obesity 12/29/2010  . Fatigue 12/29/2010  . Allergic rhinitis, cause unspecified   . Chronic pain   . GERD (gastroesophageal reflux disease)   . Sleep apnea     Past Medical History  Diagnosis Date  . Allergic rhinitis, cause unspecified   . Chronic pain   . GERD (gastroesophageal reflux disease)   . HTN (hypertension)   . Sleep apnea   .  Abnormal uterine bleeding   . Dyspareunia   . Fibroid   . Urinary incontinence   . Kidney stones   . Bursitis of left shoulder     Past Surgical History  Procedure Laterality Date  . Umbilical hernia repair  2010  . Carpal tunnel release  1998  . Dental implants  2010  . Esophageal dilation  2010  . Hammer toe surgery      rt  . Septoplasty    . Rotator cuff repair Right 09/2011    Dr. Tamera Punt  . Pelvic laparoscopy  1990  .  dilatation and curettage  2000    Current Outpatient Prescriptions  Medication Sig Dispense Refill  . Cholecalciferol (VITAMIN D3) 2000 UNITS TABS Take 2,000 Units by mouth 2 (two) times daily.     . cyanocobalamin (,VITAMIN B-12,) 1000 MCG/ML injection Do an injection weekly for the first 4 weeks then monthly thereafter. 10 mL 1  . Multiple Vitamin (MULTIVITAMIN) tablet Take 1 tablet by mouth daily.    . Needles & Syringes MISC 1 Can by Does not apply route once a week. Dispense meals for B12 IM injections please thank you 10 each 1  . OVER THE COUNTER MEDICATION Take 450 mg by mouth 2 (two) times daily. Tumeric  Take 1 tablet bid    . ergocalciferol (DRISDOL) 8000 UNIT/ML drops Take by mouth.    . oxyCODONE-acetaminophen (PERCOCET/ROXICET) 5-325 MG per tablet     . Ubiquinol 100 MG CAPS Take 1 capsule by mouth daily.    . vitamin C (ASCORBIC ACID) 500 MG tablet Take by mouth.     No current facility-administered medications for this visit.     ALLERGIES: Cefixime; Celebrex; Levofloxacin; Other; Penicillins; Robaxin; Tomato; Adhesive; and Tetracyclines & related  Family History  Problem Relation Age of Onset  . Rheumatologic disease Mother   . Hypertension Mother   . Hypertension Father   . Rheumatologic disease Sister   . Arthritis Sister   . Ovarian cancer Sister 14    History   Social History  . Marital Status: Married    Spouse Name: N/A    Number of Children: N/A  . Years of Education: N/A   Occupational History  . Not on file.   Social History Main Topics  . Smoking status: Never Smoker   . Smokeless tobacco: Never Used  . Alcohol Use: No  . Drug Use: No  . Sexual Activity: No   Other Topics Concern  . Not on file   Social History Narrative    ROS:  Pertinent items are noted in HPI.  PHYSICAL EXAMINATION:    BP 130/80 mmHg  Pulse 88  Resp 16  Ht 5' (1.524 m)  Wt 222 lb (100.699 kg)  BMI 43.36 kg/m2  LMP 01/16/2002   Wt Readings from Last 3 Encounters:  01/07/14 222 lb (100.699 kg)  12/16/13 226 lb (102.513 kg)  11/10/13 218 lb (98.884 kg)     Ht Readings from Last 3 Encounters:  01/07/14 5' (1.524 m)  12/16/13 5' (1.524 m)  10/21/13 5'  (1.524 m)    General appearance: alert, cooperative and appears stated age Head: Normocephalic, without obvious abnormality, atraumatic Back:  No CVA tenderness. Abdomen: soft, non-tender; no masses,  no organomegaly No abnormal inguinal nodes palpated  Pelvic: External genitalia:  no lesions              Urethra:  normal appearing urethra with no masses, tenderness or lesions  Bartholins and Skenes: normal                 Vagina: normal appearing vagina with normal color and discharge, no lesions              Cervix: normal appearance                 Bimanual Exam:  Uterus:  uterus is normal size, shape, consistency and nontender                                      Adnexa: normal adnexa in size, nontender and no masses  ASSESSMENT  RLQ pain.  Recent viral illness? Back pain.  FH of ovarian cancer.  Mixed incontinence.  History of renal stones.   PLAN  Return for pelvic ultrasound to check ovaries.  Discussed mixed incontinence with patient.  Discussed medication or potential physical therapy if desires.  Patient states she would like to complete her shoulder physical therapy first.   An After Visit Summary was printed and given to the patient.  25 minutes face to face time of which over 50% was spent in counseling.

## 2014-01-07 NOTE — Assessment & Plan Note (Signed)
Patient's underlying chronic pain is likely was conjoining the most this pain. Patient does have degenerative disc and degenerative joint disease of multiple joints. Encourage patient to continue to try to remain active. With patient now and physical therapy also she'll start increasing her activity. Patient is going to see an integrative Dr. Runell Gess I think will be beneficial. Other things such as allergies or types of infection could be contributing to some of her aches and pains. Patient does continues to respond significant he well to osteopathic manipulation and conservative therapy. We will continue and patient will come back in 3-4 weeks for further evaluation and treatment.

## 2014-01-07 NOTE — Patient Instructions (Signed)
Good to see you You are doing great.  Ice is your friend Continue with what you are doing. Increase activity.  Garret Reddish would be great PCP For stomach consider Dr Carlean Purl or Dr. Fuller Plan here with Vel[facility] Heckler.  I am interested to see what Dr. Sharol Roussel says.

## 2014-01-07 NOTE — Progress Notes (Signed)
Patient scheduled for pelvic ultrasound while in office. Scheduled for 01/15/14 at 1530 at [facility]. Printed appointment information and instructions given to patient.  Verbalized understanding of instructions.

## 2014-01-07 NOTE — Assessment & Plan Note (Signed)
Decision today to treat with OMT was based on Physical Exam  After verbal consent patient was treated with , ME, soft tissue techniques in cervical thoracic, lumbar and sacral areas  Patient tolerated the procedure well with improvement in symptoms  Patient given exercises, stretches and lifestyle modifications  See medications in patient instructions if given  Patient will follow up in 3-4  weeks

## 2014-01-07 NOTE — Assessment & Plan Note (Signed)
Patient does have end-stage osteophytic changes of the knee. Patient of course does want to avoid surgical intervention with her having 2 recent surgeries on her shoulders over the course last couple years. Patient was given a brace today and I think she will do relatively well with a medial unloader brace. This seemed to be comfortable and sized appropriately. Patient will continue with the home exercises and the over-the-counter medications. We will discuss again at follow-up in 3-4 weeks.

## 2014-01-07 NOTE — Progress Notes (Signed)
CC: Follow  Up and left shoulder pain and chronic back pain.   Continued right knee pain Patient did have a rotator cuff tear and did have surgery. Patient is now 8 weeks since surgery.  Patient has been doing physical therapy and has been doing relatively well.  Patient is also here for a manipulation. Patient states overall she has been doing fairly well. Patient continues to have some difficulty with chronic aches and pains. Patient states with her not doing as much activity and has gained weight secondary to this she started having more up her back and neck pain.   patient is also complaining of right knee pain. Patient has had this pain previously. Patient's pain secondary to osteophytic changes. Patient is wondering if there is anything else she can do other than the home exercises. Patient has had an injection previously and states that overall she is doing well and does not want another injection but is wondering if there is another brace. Patient has had a custom brace previously but unfortunately it is too large and bulky. Denies any new symptoms is worsening of previous symptoms.  Last menstrual period 01/16/2002. General: No apparent distress alert and oriented x3 mood and affect normal Respiratory: Patient's recent full sentences and does not appear short of breath  Skin: Warm dry intact with no signs of infection or rash  Neuro: Cranial nerves II through XII are intact, neurovascularly intact in all extremities with 2+ DTRs and 2+ pulses.   shoulders out of a sling.   knee exam on the right side shows the patient is tender to palpation over the medial joint line. Mild crepitus with range of motion. Full range of motion.  OMT findings  Cervical C2 flexed rotated and side bent left C5 extended rotated and side bent left  thoracic  T1 E RS right  T6 extended rotated and side bent  right

## 2014-01-12 ENCOUNTER — Telehealth: Payer: Self-pay | Admitting: Obstetrics and Gynecology

## 2014-01-12 DIAGNOSIS — M75122 Complete rotator cuff tear or rupture of left shoulder, not specified as traumatic: Secondary | ICD-10-CM | POA: Diagnosis not present

## 2014-01-12 DIAGNOSIS — M25512 Pain in left shoulder: Secondary | ICD-10-CM | POA: Diagnosis not present

## 2014-01-12 DIAGNOSIS — R1031 Right lower quadrant pain: Principal | ICD-10-CM

## 2014-01-12 DIAGNOSIS — M25612 Stiffness of left shoulder, not elsewhere classified: Secondary | ICD-10-CM | POA: Diagnosis not present

## 2014-01-12 DIAGNOSIS — M6281 Muscle weakness (generalized): Secondary | ICD-10-CM | POA: Diagnosis not present

## 2014-01-12 DIAGNOSIS — G8929 Other chronic pain: Secondary | ICD-10-CM

## 2014-01-12 NOTE — Telephone Encounter (Signed)
Pt wants to cancel her ultrasound at [facility] and have it here with Dr.Silva.

## 2014-01-12 NOTE — Addendum Note (Signed)
Addended by: Michele Mcalpine on: 01/12/2014 12:29 PM   Modules accepted: Orders

## 2014-01-12 NOTE — Telephone Encounter (Signed)
Spoke with patient. She states she had pain initially after exam with Dr. Quincy Simmonds and has not had any pain since. She really prefers not to enter any hospitals at this time of year.  She declines 01/22/14 appointment as she has another appointment that day.  Scheduled Pelvic ultrasound for 01/29/14 per patient request. Advised to call back with any change in symptoms, patient verbalized understanding.  Requests that this RN cancel ultrasound at hospital, done.   Routing to provider for final review. Patient agreeable to disposition. Will close encounter

## 2014-01-12 NOTE — Telephone Encounter (Signed)
OK to wait until 01/22/13 and do the ultrasound here in office if patient is not having significant pain.  RLQ pain has been chronic in nature.

## 2014-01-12 NOTE — Telephone Encounter (Signed)
Dr. Quincy Simmonds,  Can you review and advise if okay to reschedule patient ultrasound appointments.  Next available in our office 1/7 or 1/14 for ultrasound with you. Pt is scheduled for 01/15/14 at [facility]

## 2014-01-15 ENCOUNTER — Ambulatory Visit (HOSPITAL_COMMUNITY): Payer: Medicare Other

## 2014-01-15 DIAGNOSIS — M6281 Muscle weakness (generalized): Secondary | ICD-10-CM | POA: Diagnosis not present

## 2014-01-15 DIAGNOSIS — M25612 Stiffness of left shoulder, not elsewhere classified: Secondary | ICD-10-CM | POA: Diagnosis not present

## 2014-01-15 DIAGNOSIS — M75122 Complete rotator cuff tear or rupture of left shoulder, not specified as traumatic: Secondary | ICD-10-CM | POA: Diagnosis not present

## 2014-01-15 DIAGNOSIS — M25512 Pain in left shoulder: Secondary | ICD-10-CM | POA: Diagnosis not present

## 2014-01-19 DIAGNOSIS — M25612 Stiffness of left shoulder, not elsewhere classified: Secondary | ICD-10-CM | POA: Diagnosis not present

## 2014-01-19 DIAGNOSIS — M75122 Complete rotator cuff tear or rupture of left shoulder, not specified as traumatic: Secondary | ICD-10-CM | POA: Diagnosis not present

## 2014-01-19 DIAGNOSIS — M25512 Pain in left shoulder: Secondary | ICD-10-CM | POA: Diagnosis not present

## 2014-01-19 DIAGNOSIS — M6281 Muscle weakness (generalized): Secondary | ICD-10-CM | POA: Diagnosis not present

## 2014-01-22 DIAGNOSIS — E7210 Disorders of sulfur-bearing amino-acid metabolism, unspecified: Secondary | ICD-10-CM | POA: Diagnosis not present

## 2014-01-22 DIAGNOSIS — M1389 Other specified arthritis, multiple sites: Secondary | ICD-10-CM | POA: Diagnosis not present

## 2014-01-22 DIAGNOSIS — E559 Vitamin D deficiency, unspecified: Secondary | ICD-10-CM | POA: Diagnosis not present

## 2014-01-22 DIAGNOSIS — M75122 Complete rotator cuff tear or rupture of left shoulder, not specified as traumatic: Secondary | ICD-10-CM | POA: Diagnosis not present

## 2014-01-22 DIAGNOSIS — E279 Disorder of adrenal gland, unspecified: Secondary | ICD-10-CM | POA: Diagnosis not present

## 2014-01-22 DIAGNOSIS — M791 Myalgia: Secondary | ICD-10-CM | POA: Diagnosis not present

## 2014-01-22 DIAGNOSIS — H524 Presbyopia: Secondary | ICD-10-CM | POA: Diagnosis not present

## 2014-01-22 DIAGNOSIS — G619 Inflammatory polyneuropathy, unspecified: Secondary | ICD-10-CM | POA: Diagnosis not present

## 2014-01-22 DIAGNOSIS — M25512 Pain in left shoulder: Secondary | ICD-10-CM | POA: Diagnosis not present

## 2014-01-22 DIAGNOSIS — M6281 Muscle weakness (generalized): Secondary | ICD-10-CM | POA: Diagnosis not present

## 2014-01-22 DIAGNOSIS — E785 Hyperlipidemia, unspecified: Secondary | ICD-10-CM | POA: Diagnosis not present

## 2014-01-22 DIAGNOSIS — E639 Nutritional deficiency, unspecified: Secondary | ICD-10-CM | POA: Diagnosis not present

## 2014-01-22 DIAGNOSIS — M25612 Stiffness of left shoulder, not elsewhere classified: Secondary | ICD-10-CM | POA: Diagnosis not present

## 2014-01-22 DIAGNOSIS — H2513 Age-related nuclear cataract, bilateral: Secondary | ICD-10-CM | POA: Diagnosis not present

## 2014-01-22 DIAGNOSIS — E509 Vitamin A deficiency, unspecified: Secondary | ICD-10-CM | POA: Diagnosis not present

## 2014-01-22 DIAGNOSIS — E039 Hypothyroidism, unspecified: Secondary | ICD-10-CM | POA: Diagnosis not present

## 2014-01-22 DIAGNOSIS — D8989 Other specified disorders involving the immune mechanism, not elsewhere classified: Secondary | ICD-10-CM | POA: Diagnosis not present

## 2014-01-22 DIAGNOSIS — E8881 Metabolic syndrome: Secondary | ICD-10-CM | POA: Diagnosis not present

## 2014-01-26 DIAGNOSIS — M25512 Pain in left shoulder: Secondary | ICD-10-CM | POA: Diagnosis not present

## 2014-01-26 DIAGNOSIS — M25612 Stiffness of left shoulder, not elsewhere classified: Secondary | ICD-10-CM | POA: Diagnosis not present

## 2014-01-26 DIAGNOSIS — M6281 Muscle weakness (generalized): Secondary | ICD-10-CM | POA: Diagnosis not present

## 2014-01-26 DIAGNOSIS — M75122 Complete rotator cuff tear or rupture of left shoulder, not specified as traumatic: Secondary | ICD-10-CM | POA: Diagnosis not present

## 2014-01-29 ENCOUNTER — Other Ambulatory Visit: Payer: PRIVATE HEALTH INSURANCE | Admitting: Obstetrics and Gynecology

## 2014-01-29 ENCOUNTER — Telehealth: Payer: Self-pay | Admitting: Obstetrics and Gynecology

## 2014-01-29 ENCOUNTER — Other Ambulatory Visit: Payer: PRIVATE HEALTH INSURANCE

## 2014-01-29 DIAGNOSIS — M75122 Complete rotator cuff tear or rupture of left shoulder, not specified as traumatic: Secondary | ICD-10-CM | POA: Diagnosis not present

## 2014-01-29 DIAGNOSIS — M25512 Pain in left shoulder: Secondary | ICD-10-CM | POA: Diagnosis not present

## 2014-01-29 DIAGNOSIS — M25612 Stiffness of left shoulder, not elsewhere classified: Secondary | ICD-10-CM | POA: Diagnosis not present

## 2014-01-29 DIAGNOSIS — M6281 Muscle weakness (generalized): Secondary | ICD-10-CM | POA: Diagnosis not present

## 2014-01-29 NOTE — Telephone Encounter (Signed)
Pus /consult appointment needs to be rescheduled due to emergency with ultrasound tech. Patient last seen 01/07/14.

## 2014-01-29 NOTE — Telephone Encounter (Signed)
Spoke with patient. Rescheduled pus to 02.04.2016.

## 2014-02-02 ENCOUNTER — Ambulatory Visit: Payer: Medicare Other | Admitting: Family Medicine

## 2014-02-02 DIAGNOSIS — M75122 Complete rotator cuff tear or rupture of left shoulder, not specified as traumatic: Secondary | ICD-10-CM | POA: Diagnosis not present

## 2014-02-02 DIAGNOSIS — M25612 Stiffness of left shoulder, not elsewhere classified: Secondary | ICD-10-CM | POA: Diagnosis not present

## 2014-02-02 DIAGNOSIS — M6281 Muscle weakness (generalized): Secondary | ICD-10-CM | POA: Diagnosis not present

## 2014-02-02 DIAGNOSIS — M25512 Pain in left shoulder: Secondary | ICD-10-CM | POA: Diagnosis not present

## 2014-02-05 DIAGNOSIS — M75122 Complete rotator cuff tear or rupture of left shoulder, not specified as traumatic: Secondary | ICD-10-CM | POA: Diagnosis not present

## 2014-02-05 DIAGNOSIS — M791 Myalgia: Secondary | ICD-10-CM | POA: Diagnosis not present

## 2014-02-05 DIAGNOSIS — M6281 Muscle weakness (generalized): Secondary | ICD-10-CM | POA: Diagnosis not present

## 2014-02-05 DIAGNOSIS — M25612 Stiffness of left shoulder, not elsewhere classified: Secondary | ICD-10-CM | POA: Diagnosis not present

## 2014-02-05 DIAGNOSIS — M25512 Pain in left shoulder: Secondary | ICD-10-CM | POA: Diagnosis not present

## 2014-02-06 ENCOUNTER — Ambulatory Visit: Payer: Medicare Other | Admitting: Family Medicine

## 2014-02-09 DIAGNOSIS — Z9889 Other specified postprocedural states: Secondary | ICD-10-CM | POA: Diagnosis not present

## 2014-02-10 DIAGNOSIS — G4733 Obstructive sleep apnea (adult) (pediatric): Secondary | ICD-10-CM | POA: Diagnosis not present

## 2014-02-11 DIAGNOSIS — M75122 Complete rotator cuff tear or rupture of left shoulder, not specified as traumatic: Secondary | ICD-10-CM | POA: Diagnosis not present

## 2014-02-11 DIAGNOSIS — M25612 Stiffness of left shoulder, not elsewhere classified: Secondary | ICD-10-CM | POA: Diagnosis not present

## 2014-02-11 DIAGNOSIS — M25512 Pain in left shoulder: Secondary | ICD-10-CM | POA: Diagnosis not present

## 2014-02-11 DIAGNOSIS — M6281 Muscle weakness (generalized): Secondary | ICD-10-CM | POA: Diagnosis not present

## 2014-02-17 ENCOUNTER — Telehealth: Payer: Self-pay | Admitting: *Deleted

## 2014-02-17 ENCOUNTER — Ambulatory Visit (INDEPENDENT_AMBULATORY_CARE_PROVIDER_SITE_OTHER): Payer: Medicare Other | Admitting: Family Medicine

## 2014-02-17 ENCOUNTER — Encounter: Payer: Self-pay | Admitting: Family Medicine

## 2014-02-17 ENCOUNTER — Other Ambulatory Visit (INDEPENDENT_AMBULATORY_CARE_PROVIDER_SITE_OTHER): Payer: Medicare Other

## 2014-02-17 VITALS — BP 142/84 | HR 102 | Ht 60.0 in | Wt 224.0 lb

## 2014-02-17 DIAGNOSIS — M9908 Segmental and somatic dysfunction of rib cage: Secondary | ICD-10-CM | POA: Diagnosis not present

## 2014-02-17 DIAGNOSIS — M24111 Other articular cartilage disorders, right shoulder: Secondary | ICD-10-CM | POA: Diagnosis not present

## 2014-02-17 DIAGNOSIS — M2550 Pain in unspecified joint: Secondary | ICD-10-CM

## 2014-02-17 DIAGNOSIS — M9903 Segmental and somatic dysfunction of lumbar region: Secondary | ICD-10-CM | POA: Diagnosis not present

## 2014-02-17 DIAGNOSIS — R7989 Other specified abnormal findings of blood chemistry: Secondary | ICD-10-CM | POA: Diagnosis not present

## 2014-02-17 DIAGNOSIS — M999 Biomechanical lesion, unspecified: Secondary | ICD-10-CM

## 2014-02-17 DIAGNOSIS — G8929 Other chronic pain: Secondary | ICD-10-CM | POA: Diagnosis not present

## 2014-02-17 DIAGNOSIS — M6281 Muscle weakness (generalized): Secondary | ICD-10-CM | POA: Diagnosis not present

## 2014-02-17 DIAGNOSIS — M9901 Segmental and somatic dysfunction of cervical region: Secondary | ICD-10-CM | POA: Diagnosis not present

## 2014-02-17 DIAGNOSIS — M25512 Pain in left shoulder: Secondary | ICD-10-CM | POA: Diagnosis not present

## 2014-02-17 DIAGNOSIS — M25612 Stiffness of left shoulder, not elsewhere classified: Secondary | ICD-10-CM | POA: Diagnosis not present

## 2014-02-17 LAB — URIC ACID: URIC ACID, SERUM: 7.1 mg/dL — AB (ref 2.4–7.0)

## 2014-02-17 NOTE — Assessment & Plan Note (Signed)
Patient does have some chronic pain syndrome. Asian has had workup for rheumatologic disorders previously. Patient has not had her uric acid level checked at any point. Patient's oxalate levels are elevated as well and these could be contribute to some of her pain. We discussed different changes in patient was given a handout to discussed how to decrease oxalate levels. Patient did respond well to osteopathic manipulation again today. Encouraged to do more the home exercises on a regular basis. Patient will try these changes and come back and see me again in 3-4 weeks for further evaluation and treatment.  Spent  25 minutes with patient face-to-face and had greater than 50% of counseling including as described above in assessment and plan.

## 2014-02-17 NOTE — Progress Notes (Signed)
CC: Follow  Up for chronic pain  Patient is also here for a manipulation. Patient does have chronic pain type syndrome. Patient has responded well to osteopathic manipulation previously. Patient has not been doing as much of the exercises that she is still a proximally now 14 weeks out from a left-sided rotator cuff tear repaired recently. Patient is actually seen in integrative physician at this time. Has made some different changes and did bring in labs today. Patient was found to have a very large increase in oxalate level when reviewing the labs. Mild increase in yeast count as well.     Blood pressure 142/84, pulse 102, weight 224 lb (101.606 kg), last menstrual period 01/16/2002, SpO2 97 %. General: No apparent distress alert and oriented x3 mood and affect normal Respiratory: Patient's recent full sentences and does not appear short of breath  Skin: Warm dry intact with no signs of infection or rash  Neuro: Cranial nerves II through XII are intact, neurovascularly intact in all extremities with 2+ DTRs and 2+ pulses.   shoulders out of a sling.    OMT findings  Cervical C2 flexed rotated and side bent left C5 extended rotated and side bent left  thoracic  T1 E RS right  T6 extended rotated and side bent  right  Lumbar L2 flexed rotated and side bent right  Sacrum Left on left

## 2014-02-17 NOTE — Progress Notes (Signed)
Pre visit review using our clinic review tool, if applicable. No additional management support is needed unless otherwise documented below in the visit note. 

## 2014-02-17 NOTE — Telephone Encounter (Signed)
Med list updated

## 2014-02-17 NOTE — Assessment & Plan Note (Signed)
Decision today to treat with OMT was based on Physical Exam  After verbal consent patient was treated with , ME, soft tissue techniques in cervical thoracic, lumbar and sacral areas  Patient tolerated the procedure well with improvement in symptoms  Patient given exercises, stretches and lifestyle modifications  See medications in patient instructions if given  Patient will follow up in 3-4  weeks

## 2014-02-17 NOTE — Patient Instructions (Signed)
Good to see you New exercises 3 times a week.  I think Dr. Sharol Roussel is on to something.  'Ask about uric acid and see if we should start the medicine Look over the info I gave you as well and see if we can decrease oxalte which could help See me again in 4 weeks.

## 2014-02-19 ENCOUNTER — Ambulatory Visit (INDEPENDENT_AMBULATORY_CARE_PROVIDER_SITE_OTHER): Payer: Medicare Other

## 2014-02-19 ENCOUNTER — Encounter: Payer: Self-pay | Admitting: Obstetrics and Gynecology

## 2014-02-19 ENCOUNTER — Ambulatory Visit (INDEPENDENT_AMBULATORY_CARE_PROVIDER_SITE_OTHER): Payer: Medicare Other | Admitting: Obstetrics and Gynecology

## 2014-02-19 VITALS — BP 138/62 | HR 70 | Ht 60.0 in | Wt 224.0 lb

## 2014-02-19 DIAGNOSIS — G8929 Other chronic pain: Secondary | ICD-10-CM

## 2014-02-19 DIAGNOSIS — Z8041 Family history of malignant neoplasm of ovary: Secondary | ICD-10-CM

## 2014-02-19 DIAGNOSIS — D259 Leiomyoma of uterus, unspecified: Secondary | ICD-10-CM | POA: Diagnosis not present

## 2014-02-19 DIAGNOSIS — R9389 Abnormal findings on diagnostic imaging of other specified body structures: Secondary | ICD-10-CM

## 2014-02-19 DIAGNOSIS — R1031 Right lower quadrant pain: Secondary | ICD-10-CM | POA: Diagnosis not present

## 2014-02-19 DIAGNOSIS — R938 Abnormal findings on diagnostic imaging of other specified body structures: Secondary | ICD-10-CM | POA: Diagnosis not present

## 2014-02-19 NOTE — Progress Notes (Signed)
Subjective  64 y.o. Married Caucasian female  G2P2002 with Patient's last menstrual period was 01/16/2002.  here for pelvic ultrasound for evaluation of chronic RLQ abdominal pain and back pain. Concerned about ovarian cancer due to her sister's history of ovarian cancer.  Has known history of uterine fibroids by prior ultrasound in 2014.   Had vaginal bleeding or spotting 1 - 2 years ago and was having kidney stones at the same time.  No evaluation or endometrial biopsy was done at that time.   Thinks she had an endometrial biopsy many years ago with Dr. Theda Sers due to irregular bleeding and thickened endometrium.  States she had a more difficulty time due to angulation of the uterus.   Saw Dr. Adriana Simas for evaluation of health issues. Patient has a high oxylate level.  Tested also high for yeast as well.  Tomorrow will start antifungal treatment and a low carbohydrate diet.   Also sees Dr. Tamala Julian from [facility].   Not on HRT.   Objective  Pelvic ultrasound report and images reviewed with patient.  Uterus - 2.8 cm fundal fibroid, 2.3 cm cervical fibroid. EMS - 5.61 mm on transvaginal scan.  Ovaries normal.  No free fluid.       Assessment  Chronic RLQ and back pain.  Uterine fibroids.  Slightly thickened endometrium. Obesity. Family history of ovarian cancer.   Plan  Discussion of fibroids, endometrial cancer, and ovarian cancer.  Reassurance regarding normal ovaries.  I recommend doing yearly pelvic ultrasounds due to exams limited by patient's body habitus.  This discussed with patient.  Return for endometrial biopsy.  Procedure reviewed.  Consider CA125.   25 minutes face to face time of which over 50% was spent in counseling.   After visit summary to patient.

## 2014-02-24 ENCOUNTER — Telehealth: Payer: Self-pay | Admitting: Obstetrics and Gynecology

## 2014-02-24 DIAGNOSIS — M75122 Complete rotator cuff tear or rupture of left shoulder, not specified as traumatic: Secondary | ICD-10-CM | POA: Diagnosis not present

## 2014-02-24 DIAGNOSIS — M6281 Muscle weakness (generalized): Secondary | ICD-10-CM | POA: Diagnosis not present

## 2014-02-24 DIAGNOSIS — M25512 Pain in left shoulder: Secondary | ICD-10-CM | POA: Diagnosis not present

## 2014-02-24 DIAGNOSIS — M25612 Stiffness of left shoulder, not elsewhere classified: Secondary | ICD-10-CM | POA: Diagnosis not present

## 2014-02-24 NOTE — Telephone Encounter (Signed)
Spoke with patient. Advised that per benefit quote received, she will have 0 liability when she comes in for EMB. Patient agreeable. Scheduled EMB.  Advised patient of 72 hour cancellation policy and $161 cancellation fee. Patient agreeable.  Mailed the In-Office procedure form that includes appointment date and time, patient copay, and cancellation policy.

## 2014-03-03 DIAGNOSIS — M25612 Stiffness of left shoulder, not elsewhere classified: Secondary | ICD-10-CM | POA: Diagnosis not present

## 2014-03-03 DIAGNOSIS — M25512 Pain in left shoulder: Secondary | ICD-10-CM | POA: Diagnosis not present

## 2014-03-03 DIAGNOSIS — M6281 Muscle weakness (generalized): Secondary | ICD-10-CM | POA: Diagnosis not present

## 2014-03-03 DIAGNOSIS — M75122 Complete rotator cuff tear or rupture of left shoulder, not specified as traumatic: Secondary | ICD-10-CM | POA: Diagnosis not present

## 2014-03-09 DIAGNOSIS — R0600 Dyspnea, unspecified: Secondary | ICD-10-CM | POA: Diagnosis not present

## 2014-03-09 DIAGNOSIS — R5383 Other fatigue: Secondary | ICD-10-CM | POA: Diagnosis not present

## 2014-03-09 DIAGNOSIS — G4733 Obstructive sleep apnea (adult) (pediatric): Secondary | ICD-10-CM | POA: Diagnosis not present

## 2014-03-10 DIAGNOSIS — M75122 Complete rotator cuff tear or rupture of left shoulder, not specified as traumatic: Secondary | ICD-10-CM | POA: Diagnosis not present

## 2014-03-10 DIAGNOSIS — M6281 Muscle weakness (generalized): Secondary | ICD-10-CM | POA: Diagnosis not present

## 2014-03-10 DIAGNOSIS — M25512 Pain in left shoulder: Secondary | ICD-10-CM | POA: Diagnosis not present

## 2014-03-10 DIAGNOSIS — M25612 Stiffness of left shoulder, not elsewhere classified: Secondary | ICD-10-CM | POA: Diagnosis not present

## 2014-03-12 DIAGNOSIS — R079 Chest pain, unspecified: Secondary | ICD-10-CM | POA: Diagnosis not present

## 2014-03-17 ENCOUNTER — Ambulatory Visit: Payer: Medicare Other | Admitting: Family Medicine

## 2014-03-17 DIAGNOSIS — G4733 Obstructive sleep apnea (adult) (pediatric): Secondary | ICD-10-CM | POA: Diagnosis not present

## 2014-03-19 ENCOUNTER — Encounter: Payer: Self-pay | Admitting: Obstetrics and Gynecology

## 2014-03-19 ENCOUNTER — Ambulatory Visit (INDEPENDENT_AMBULATORY_CARE_PROVIDER_SITE_OTHER): Payer: Medicare Other | Admitting: Obstetrics and Gynecology

## 2014-03-19 VITALS — BP 140/84 | HR 75 | Ht 60.0 in | Wt 214.0 lb

## 2014-03-19 DIAGNOSIS — M75122 Complete rotator cuff tear or rupture of left shoulder, not specified as traumatic: Secondary | ICD-10-CM | POA: Diagnosis not present

## 2014-03-19 DIAGNOSIS — R938 Abnormal findings on diagnostic imaging of other specified body structures: Secondary | ICD-10-CM | POA: Diagnosis not present

## 2014-03-19 DIAGNOSIS — D259 Leiomyoma of uterus, unspecified: Secondary | ICD-10-CM

## 2014-03-19 DIAGNOSIS — M6281 Muscle weakness (generalized): Secondary | ICD-10-CM | POA: Diagnosis not present

## 2014-03-19 DIAGNOSIS — R9389 Abnormal findings on diagnostic imaging of other specified body structures: Secondary | ICD-10-CM

## 2014-03-19 DIAGNOSIS — M25512 Pain in left shoulder: Secondary | ICD-10-CM | POA: Diagnosis not present

## 2014-03-19 DIAGNOSIS — M25612 Stiffness of left shoulder, not elsewhere classified: Secondary | ICD-10-CM | POA: Diagnosis not present

## 2014-03-19 NOTE — Progress Notes (Signed)
Patient ID: Kerry [name], female   DOB: July 15, 1950, 64 y.o.   MRN: [id number] GYNECOLOGY  VISIT   HPI: 64 y.o.   Married  Caucasian  female   G2P2002 with Patient's last menstrual period was 01/16/2002.   here for Endometrial biopsy for thickened endometrium noted on pelvic ultrasound.  Pelvic ultrasound ordered, however, for RLQ pain, back pain, and sister with history of ovarian cancer.  Patient is not on any hormonal therapy.  Has not had any vaginal bleeding.   Had an episode of bleeding about a year ago which was attributed to blood in the urine from renal stone.  Uterus 02/19/14 - 2.8 cm fundal fibroid, 2.3 cm cervical fibroid. EMS - 5.61 mm on transvaginal scan.  Ovaries normal.  No free fluid.   States that Dr. Theda Sers, her prior gynecologist, tried to do an endometrial biopsy in the past and had difficulty due to the "curve of her uterus."  GYNECOLOGIC HISTORY: Patient's last menstrual period was 01/16/2002. Contraception: Postmenopausal   Menopausal hormone therapy: none        OB History    Gravida Para Term Preterm AB TAB SAB Ectopic Multiple Living   2 2 2       2          Patient Active Problem List   Diagnosis Date Noted  . High plasma oxalate 02/17/2014  . Nonallopathic lesion of cervical region 12/19/2012  . Fibroids 05/28/2012  . Family history of ovarian cancer 05/28/2012  . Strain of quadriceps muscle 04/17/2012  . Nonallopathic lesion of costovertebral region 01/23/2012  . Left shoulder pain 12/20/2011  . Rotator cuff disorder 09/26/2011  . Bilateral foot pain 09/20/2011  . Arthralgia of multiple joints 07/26/2011  . Arthritis of knee, degenerative 07/26/2011  . Arthritis of hand, degenerative 07/26/2011  . LBP (low back pain) 07/26/2011  . Vitamin B 12 deficiency 03/05/2011  . TMJ (temporomandibular joint syndrome) 03/05/2011  . Pes planus 03/01/2011  . Right knee pain 03/01/2011  . Greater trochanteric bursitis of left hip 03/01/2011  .  Nonallopathic lesion of lumbosacral region 01/27/2011  . Obesity 12/29/2010  . Fatigue 12/29/2010  . Allergic rhinitis, cause unspecified   . Chronic pain   . GERD (gastroesophageal reflux disease)   . Sleep apnea     Past Medical History  Diagnosis Date  . Allergic rhinitis, cause unspecified   . Chronic pain   . GERD (gastroesophageal reflux disease)   . HTN (hypertension)   . Sleep apnea   . Abnormal uterine bleeding   . Dyspareunia   . Fibroid   . Urinary incontinence   . Kidney stones   . Bursitis of left shoulder     Past Surgical History  Procedure Laterality Date  . Umbilical hernia repair  2010  . Carpal tunnel release  1998  . Dental implants  2010  . Esophageal dilation  2010  . Hammer toe surgery      rt  . Septoplasty    . Rotator cuff repair Right 09/2011    Dr. Tamera Punt  . Pelvic laparoscopy  1990  .  dilatation and curettage  2000  . Rotator cuff repair Left 11/11/13    Dr. Tamera Punt     Current Outpatient Prescriptions  Medication Sig Dispense Refill  . Calcium 150 MG TABS Take 1 tablet by mouth 3 (three) times daily.    . Cholecalciferol 5000 UNITS capsule Take 5,000 Units by mouth daily.    . cyanocobalamin (,VITAMIN B-12,) 1000  MCG/ML injection Do an injection weekly for the first 4 weeks then monthly thereafter. (Patient taking differently: Inject into the muscle every 30 (thirty) days. Do an injection weekly for the first 4 weeks then monthly thereafter.) 10 mL 1  . fluconazole (DIFLUCAN) 100 MG tablet Take 100 mg by mouth daily. Takes 1/2 tablet daily    . Magnesium 100 MG TABS Take 1 tablet by mouth 3 (three) times daily.    . Multiple Vitamin (MULTIVITAMIN) tablet Take 1 tablet by mouth 2 (two) times daily. 950mg     . Needles & Syringes MISC 1 Can by Does not apply route once a week. Dispense meals for B12 IM injections please thank you 10 each 1  . NON FORMULARY Zeolite--patient take 1/2 tablet before breakfast and dinner    . Omega-3 Fatty  Acids (FISH OIL) 1000 MG CAPS Take 1,000 mg by mouth daily.    Marland Kitchen OVER THE COUNTER MEDICATION Take 450 mg by mouth 2 (two) times daily. Tumeric  Take 1 tablet bid    . PHOSPHATIDYL CHOLINE PO Take 1 capsule by mouth 3 (three) times daily with meals.    . Probiotic Product (PROBIOTIC DAILY PO) Take 1 capsule by mouth daily.    Marland Kitchen pyridOXINE (B-6) 50 MG tablet Take 50 mg by mouth daily.    . vitamin A 25000 UNIT capsule Take 25,000 Units by mouth daily. Takes every 3rd day    . vitamin C (ASCORBIC ACID) 500 MG tablet Take by mouth daily.     . Zinc Gluconate 50 MG CAPS Take 1 capsule by mouth 2 (two) times daily.     No current facility-administered medications for this visit.     ALLERGIES: Cefixime; Celebrex; Levofloxacin; Other; Penicillins; Robaxin; Tomato; Adhesive; and Tetracyclines & related  Family History  Problem Relation Age of Onset  . Rheumatologic disease Mother   . Hypertension Mother   . Hypertension Father   . Rheumatologic disease Sister   . Arthritis Sister   . Ovarian cancer Sister 34    History   Social History  . Marital Status: Married    Spouse Name: N/A  . Number of Children: N/A  . Years of Education: N/A   Occupational History  . Not on file.   Social History Main Topics  . Smoking status: Never Smoker   . Smokeless tobacco: Never Used  . Alcohol Use: No  . Drug Use: No  . Sexual Activity: No   Other Topics Concern  . Not on file   Social History Narrative    ROS:  Pertinent items are noted in HPI.  PHYSICAL EXAMINATION:    BP 140/84 mmHg  Pulse 75  Ht 5' (1.524 m)  Wt 214 lb (97.07 kg)  BMI 41.79 kg/m2  LMP 01/16/2002     General appearance: alert, cooperative and appears stated age   Pelvic: External genitalia:  no lesions              Urethra:  normal appearing urethra with no masses, tenderness or lesions              Bartholins and Skenes: normal                 Vagina: normal appearing vagina with normal color and discharge,  no lesions              Cervix: normal appearance  Bimanual Exam:  Uterus:  uterus is normal size, shape, consistency and nontender                                      Adnexa: normal adnexa in size, nontender and no masses                                     Procedure - endometrial biopsy. Consent for procedure.  Speculum placed in vagina.  Sterile prep of cervix with Hibiclens.  Tenaculum to anterior cervical lip.  Attempt to pass P[place] - unsuccessful.  Attempt to use os finder - goes in part way.  Paracervical block with 1% lidocaine, lot number [id number], expiration 06/17/14.  Attempt to dilate and sound uterus, unsuccessful. I fell an obstruction.  I believe that this may be the patient's cervical fibroid. I aborted the procedure.  Minimal EBL.  No complications occurred.  ASSESSMENT  Thickening of the endometrium, just above the limits of normal. This is asymptomatic.  Cervical fibroid causing stenosis.  FH of ovarian cancer.   PLAN  I discussed hysteroscopy with dilation and curettage versus observation with follow up ultrasound in 6 months.  Dilation and curettage may require ultrasound guidance in the OR and could still be unsuccessful due to the fibroid.  I recommend doing the latter as patient is asymptomatic and is not on any HRT.  Patient is in agreement.  She has an annual exam with me this summer and we can reassess then or sooner if she develops vaginal bleeding at any time.   An After Visit Summary was printed and given to the patient.  __15____ minutes face to face time of which over 50% was spent in counseling.

## 2014-03-20 DIAGNOSIS — E559 Vitamin D deficiency, unspecified: Secondary | ICD-10-CM | POA: Diagnosis not present

## 2014-03-20 DIAGNOSIS — G4733 Obstructive sleep apnea (adult) (pediatric): Secondary | ICD-10-CM | POA: Diagnosis not present

## 2014-03-23 ENCOUNTER — Encounter: Payer: Self-pay | Admitting: Family Medicine

## 2014-03-23 ENCOUNTER — Ambulatory Visit (INDEPENDENT_AMBULATORY_CARE_PROVIDER_SITE_OTHER): Payer: Medicare Other | Admitting: Family Medicine

## 2014-03-23 VITALS — BP 140/80 | HR 101 | Ht 60.0 in | Wt 224.0 lb

## 2014-03-23 DIAGNOSIS — Z09 Encounter for follow-up examination after completed treatment for conditions other than malignant neoplasm: Secondary | ICD-10-CM | POA: Diagnosis not present

## 2014-03-23 DIAGNOSIS — M9908 Segmental and somatic dysfunction of rib cage: Secondary | ICD-10-CM

## 2014-03-23 DIAGNOSIS — M9903 Segmental and somatic dysfunction of lumbar region: Secondary | ICD-10-CM | POA: Diagnosis not present

## 2014-03-23 DIAGNOSIS — M1009 Idiopathic gout, multiple sites: Secondary | ICD-10-CM | POA: Diagnosis not present

## 2014-03-23 DIAGNOSIS — M999 Biomechanical lesion, unspecified: Secondary | ICD-10-CM

## 2014-03-23 DIAGNOSIS — M9901 Segmental and somatic dysfunction of cervical region: Secondary | ICD-10-CM

## 2014-03-23 DIAGNOSIS — G8929 Other chronic pain: Secondary | ICD-10-CM

## 2014-03-23 DIAGNOSIS — M109 Gout, unspecified: Secondary | ICD-10-CM | POA: Insufficient documentation

## 2014-03-23 DIAGNOSIS — Z9889 Other specified postprocedural states: Secondary | ICD-10-CM | POA: Diagnosis not present

## 2014-03-23 MED ORDER — ALLOPURINOL 100 MG PO TABS: 200.0000 mg | ORAL_TABLET | Freq: Every day | ORAL | Status: DC

## 2014-03-23 NOTE — Progress Notes (Signed)
Pre visit review using our clinic review tool, if applicable. No additional management support is needed unless otherwise documented below in the visit note. 

## 2014-03-23 NOTE — Assessment & Plan Note (Signed)
Patient continues to respond fairly well to osteopathic manipulation. Patient is going to come back again in 4 weeks for further evaluation. We discussed starting the exercises. Patient is adamant she does not want to make too many changes. I do believe that patient's elevated uric acid could be playing a role as well and we are can start treatment. Patient warned of potential side effects and has had significant number of allergies. Patient will try to make these changes in weekly intervals. Patient and will come back and see me again once again in 4 weeks.

## 2014-03-23 NOTE — Assessment & Plan Note (Signed)
Decision today to treat with OMT was based on Physical Exam  After verbal consent patient was treated with , ME, soft tissue techniques in cervical thoracic, lumbar and sacral areas  Patient tolerated the procedure well with improvement in symptoms  Patient given exercises, stretches and lifestyle modifications  See medications in patient instructions if given  Patient will follow up in 3-4  weeks

## 2014-03-23 NOTE — Patient Instructions (Signed)
Good to see you Ice is your friend Allopurinol 100mg  nightly, for 1 week then 200mg  nightly thereafter.  Conitnue your exercises Stay active Talk to Dr. Sharol Roussel about the chelation.  See you on April fools and tell hubby [phone].

## 2014-03-23 NOTE — Progress Notes (Signed)
CC: Follow  Up for chronic pain  Patient is also here for a manipulation. Patient does have chronic pain type syndrome. Patient has responded well to osteopathic manipulation previously.  Patient states that overall she is been doing relatively well. Patient is seen a integrative physician recently and they are making significant changes to the vitamins as well as patient's diet. Patient states that unfortunately she has not had any significant arthritis changes. Patient continues to have the pain. Patient is becoming somewhat concerned with the different changes. Patient is to start chelating, no new pain  Patient was found to have elevated uric acid at 7.1.  Patient denies striking her exercises because she was concerned and I can exacerbate some of the problems.     Blood pressure 140/80, pulse 101, height 5' (1.524 m), weight 224 lb (101.606 kg), last menstrual period 01/16/2002, SpO2 93 %. General: No apparent distress alert and oriented x3 mood and affect normal Respiratory: Patient's recent full sentences and does not appear short of breath  Skin: Warm dry intact with no signs of infection or rash  Neuro: Cranial nerves II through XII are intact, neurovascularly intact in all extremities with 2+ DTRs and 2+ pulses.     OMT findings  Cervical C2 flexed rotated and side bent left C5 extended rotated and side bent left  thoracic  T1 E RS right  T6 extended rotated and side bent  right  Lumbar L2 flexed rotated and side bent right  Sacrum Left on left

## 2014-03-25 DIAGNOSIS — M75122 Complete rotator cuff tear or rupture of left shoulder, not specified as traumatic: Secondary | ICD-10-CM | POA: Diagnosis not present

## 2014-03-25 DIAGNOSIS — M6281 Muscle weakness (generalized): Secondary | ICD-10-CM | POA: Diagnosis not present

## 2014-03-25 DIAGNOSIS — L814 Other melanin hyperpigmentation: Secondary | ICD-10-CM | POA: Diagnosis not present

## 2014-03-25 DIAGNOSIS — L570 Actinic keratosis: Secondary | ICD-10-CM | POA: Diagnosis not present

## 2014-03-25 DIAGNOSIS — D1801 Hemangioma of skin and subcutaneous tissue: Secondary | ICD-10-CM | POA: Diagnosis not present

## 2014-03-25 DIAGNOSIS — L308 Other specified dermatitis: Secondary | ICD-10-CM | POA: Diagnosis not present

## 2014-03-25 DIAGNOSIS — M25512 Pain in left shoulder: Secondary | ICD-10-CM | POA: Diagnosis not present

## 2014-03-25 DIAGNOSIS — M25612 Stiffness of left shoulder, not elsewhere classified: Secondary | ICD-10-CM | POA: Diagnosis not present

## 2014-03-25 DIAGNOSIS — B372 Candidiasis of skin and nail: Secondary | ICD-10-CM | POA: Diagnosis not present

## 2014-03-27 DIAGNOSIS — M1389 Other specified arthritis, multiple sites: Secondary | ICD-10-CM | POA: Diagnosis not present

## 2014-03-27 DIAGNOSIS — Z91018 Allergy to other foods: Secondary | ICD-10-CM | POA: Diagnosis not present

## 2014-03-30 DIAGNOSIS — R0600 Dyspnea, unspecified: Secondary | ICD-10-CM | POA: Diagnosis not present

## 2014-03-30 DIAGNOSIS — G4733 Obstructive sleep apnea (adult) (pediatric): Secondary | ICD-10-CM | POA: Diagnosis not present

## 2014-03-30 DIAGNOSIS — Z7189 Other specified counseling: Secondary | ICD-10-CM | POA: Diagnosis not present

## 2014-03-30 DIAGNOSIS — Z9181 History of falling: Secondary | ICD-10-CM | POA: Diagnosis not present

## 2014-03-30 DIAGNOSIS — R5383 Other fatigue: Secondary | ICD-10-CM | POA: Diagnosis not present

## 2014-03-30 DIAGNOSIS — Z2821 Immunization not carried out because of patient refusal: Secondary | ICD-10-CM | POA: Diagnosis not present

## 2014-03-30 DIAGNOSIS — Z23 Encounter for immunization: Secondary | ICD-10-CM | POA: Diagnosis not present

## 2014-03-31 DIAGNOSIS — M75122 Complete rotator cuff tear or rupture of left shoulder, not specified as traumatic: Secondary | ICD-10-CM | POA: Diagnosis not present

## 2014-03-31 DIAGNOSIS — M6281 Muscle weakness (generalized): Secondary | ICD-10-CM | POA: Diagnosis not present

## 2014-03-31 DIAGNOSIS — M25512 Pain in left shoulder: Secondary | ICD-10-CM | POA: Diagnosis not present

## 2014-03-31 DIAGNOSIS — M25612 Stiffness of left shoulder, not elsewhere classified: Secondary | ICD-10-CM | POA: Diagnosis not present

## 2014-04-07 DIAGNOSIS — M6281 Muscle weakness (generalized): Secondary | ICD-10-CM | POA: Diagnosis not present

## 2014-04-07 DIAGNOSIS — M25512 Pain in left shoulder: Secondary | ICD-10-CM | POA: Diagnosis not present

## 2014-04-07 DIAGNOSIS — M25612 Stiffness of left shoulder, not elsewhere classified: Secondary | ICD-10-CM | POA: Diagnosis not present

## 2014-04-07 DIAGNOSIS — M75122 Complete rotator cuff tear or rupture of left shoulder, not specified as traumatic: Secondary | ICD-10-CM | POA: Diagnosis not present

## 2014-04-17 ENCOUNTER — Encounter: Payer: Self-pay | Admitting: Family Medicine

## 2014-04-17 ENCOUNTER — Ambulatory Visit (INDEPENDENT_AMBULATORY_CARE_PROVIDER_SITE_OTHER): Payer: Medicare Other | Admitting: Family Medicine

## 2014-04-17 VITALS — BP 130/74 | HR 81 | Wt 213.0 lb

## 2014-04-17 DIAGNOSIS — M9908 Segmental and somatic dysfunction of rib cage: Secondary | ICD-10-CM

## 2014-04-17 DIAGNOSIS — M9903 Segmental and somatic dysfunction of lumbar region: Secondary | ICD-10-CM

## 2014-04-17 DIAGNOSIS — M1009 Idiopathic gout, multiple sites: Secondary | ICD-10-CM | POA: Diagnosis not present

## 2014-04-17 DIAGNOSIS — M545 Low back pain, unspecified: Secondary | ICD-10-CM

## 2014-04-17 DIAGNOSIS — R7989 Other specified abnormal findings of blood chemistry: Secondary | ICD-10-CM | POA: Diagnosis not present

## 2014-04-17 DIAGNOSIS — M9901 Segmental and somatic dysfunction of cervical region: Secondary | ICD-10-CM | POA: Diagnosis not present

## 2014-04-17 DIAGNOSIS — M2550 Pain in unspecified joint: Secondary | ICD-10-CM | POA: Diagnosis not present

## 2014-04-17 DIAGNOSIS — M999 Biomechanical lesion, unspecified: Secondary | ICD-10-CM

## 2014-04-17 DIAGNOSIS — M109 Gout, unspecified: Secondary | ICD-10-CM

## 2014-04-17 MED ORDER — CYANOCOBALAMIN-SALCAPROZATE 1000-100 MCG-MG PO TABS
1.0000 | ORAL_TABLET | Freq: Every day | ORAL | Status: DC
Start: 2014-04-17 — End: 2014-06-02

## 2014-04-17 NOTE — Assessment & Plan Note (Signed)
I do believe the patient likely does have more of a uric acid arthropathy that is contributing to this. I do think that patient should be treated for this.  Patient will start the allopurinol. Patient will call with colchicine as needed to bridge therapy. Patient will continue with her other medications and we did give her a new medication for B12 supplementation. Patient will monitor her symptoms. Patient is leaving for the next 5 weeks and will follow-up in 6 weeks. At that time we should recheck her uric acid to make sure she was responding to the low dose allopurinol. We may need to increase.

## 2014-04-17 NOTE — Progress Notes (Signed)
CC: Follow  Up for chronic pain  Patient is also here for a manipulation. Patient does have chronic pain type syndrome. Patient has responded well to osteopathic manipulation previously.  Patient states that overall she is been doing relatively well. Patient is seen another provider for other integrative therapies. Patient states that unfortunately it does not seem to be making any improvement. Patient is being treated with Diflucan for possible yeast as well as multiple other vitamins. Patient has not notice any significant improvement and if anything having some worsening symptoms in her feet as well as some of her arthralgia.  Patient was found to have elevated uric acid at 7.1. Patient was to start on allopurinol but was nervous of side effects. Patient also did not want to have this interact with any of her other medications. Patient is still considering starting this.      Blood pressure 130/74, pulse 81, weight 213 lb (96.616 kg), last menstrual period 01/16/2002, SpO2 95 %. General: No apparent distress alert and oriented x3 mood and affect normal Respiratory: Patient's recent full sentences and does not appear short of breath  Skin: Warm dry intact with no signs of infection or rash  Neuro: Cranial nerves II through XII are intact, neurovascularly intact in all extremities with 2+ DTRs and 2+ pulses.     OMT findings  Cervical C2 flexed rotated and side bent left C5 extended rotated and side bent left  thoracic  T1 E RS right  T6 extended rotated and side bent  right  Lumbar L2 flexed rotated and side bent right  Sacrum Left on left

## 2014-04-17 NOTE — Assessment & Plan Note (Signed)
Decision today to treat with OMT was based on Physical Exam  After verbal consent patient was treated with , ME, soft tissue techniques in cervical thoracic, lumbar and sacral areas  Patient tolerated the procedure well with improvement in symptoms  Patient given exercises, stretches and lifestyle modifications  See medications in patient instructions if given  Patient will follow up in 5-6  weeks

## 2014-04-17 NOTE — Assessment & Plan Note (Signed)
Check urine when patient comes back.

## 2014-04-17 NOTE — Assessment & Plan Note (Signed)
Discussed with patient that this is multifactorial. We discussed icing regimen and home exercises. Patient will come back and see me again in 5-6 weeks and patient returns.

## 2014-04-17 NOTE — Patient Instructions (Signed)
Good to see you Start allopurinol daily If worse then colchicine would be sent in Continue the diet but allow moderation Decrease diflucan to 2 times a week.  Enjoy the trip and see me when you  Get back.  [email or web address]

## 2014-04-30 ENCOUNTER — Encounter: Payer: Self-pay | Admitting: Family Medicine

## 2014-05-14 ENCOUNTER — Ambulatory Visit: Payer: BC Managed Care – PPO | Admitting: Obstetrics and Gynecology

## 2014-05-27 ENCOUNTER — Other Ambulatory Visit (INDEPENDENT_AMBULATORY_CARE_PROVIDER_SITE_OTHER): Payer: Medicare Other

## 2014-05-27 ENCOUNTER — Other Ambulatory Visit: Payer: Self-pay | Admitting: *Deleted

## 2014-05-27 DIAGNOSIS — M109 Gout, unspecified: Secondary | ICD-10-CM

## 2014-05-27 DIAGNOSIS — M1009 Idiopathic gout, multiple sites: Secondary | ICD-10-CM

## 2014-05-27 DIAGNOSIS — M2550 Pain in unspecified joint: Secondary | ICD-10-CM | POA: Diagnosis not present

## 2014-05-27 LAB — URIC ACID: URIC ACID, SERUM: 4.6 mg/dL (ref 2.4–7.0)

## 2014-05-29 ENCOUNTER — Ambulatory Visit: Payer: BC Managed Care – PPO | Admitting: Family Medicine

## 2014-06-02 ENCOUNTER — Ambulatory Visit (INDEPENDENT_AMBULATORY_CARE_PROVIDER_SITE_OTHER): Payer: Medicare Other | Admitting: Family Medicine

## 2014-06-02 ENCOUNTER — Encounter: Payer: Self-pay | Admitting: Family Medicine

## 2014-06-02 VITALS — BP 136/84 | HR 97 | Ht 60.0 in | Wt 210.0 lb

## 2014-06-02 DIAGNOSIS — M9908 Segmental and somatic dysfunction of rib cage: Secondary | ICD-10-CM

## 2014-06-02 DIAGNOSIS — M9901 Segmental and somatic dysfunction of cervical region: Secondary | ICD-10-CM | POA: Diagnosis not present

## 2014-06-02 DIAGNOSIS — R7989 Other specified abnormal findings of blood chemistry: Secondary | ICD-10-CM

## 2014-06-02 DIAGNOSIS — M109 Gout, unspecified: Secondary | ICD-10-CM

## 2014-06-02 DIAGNOSIS — M1009 Idiopathic gout, multiple sites: Secondary | ICD-10-CM | POA: Diagnosis not present

## 2014-06-02 DIAGNOSIS — M545 Low back pain, unspecified: Secondary | ICD-10-CM

## 2014-06-02 DIAGNOSIS — M999 Biomechanical lesion, unspecified: Secondary | ICD-10-CM

## 2014-06-02 DIAGNOSIS — M9903 Segmental and somatic dysfunction of lumbar region: Secondary | ICD-10-CM

## 2014-06-02 LAB — OXALATE, URINE, 24 HOUR: Oxalic Acid, 24 Hr U: 2.7 mg/24 h — ABNORMAL LOW (ref 3.6–38.0)

## 2014-06-02 MED ORDER — CYANOCOBALAMIN-SALCAPROZATE 1000-100 MCG-MG PO TABS: 1.0000 | ORAL_TABLET | Freq: Every day | ORAL | Status: DC

## 2014-06-02 MED ORDER — ALLOPURINOL 300 MG PO TABS: 300.0000 mg | ORAL_TABLET | Freq: Every day | ORAL | Status: DC

## 2014-06-02 NOTE — Assessment & Plan Note (Signed)
Discussed with patient at this time I do want her to increase her allopurinol 300 mg at night. I think that this will be most beneficial and I think that this is also helping most of her pain. We discussed patient can do the antifungal medications but would not do it for a long amount of time. Patient will discuss chelation with her other physician as well. Discussed which activities to potentially avoid. Patient will slowly increase her activity and come back for osteopathic manipulation in the near future. We will see how patient response to the increased dose of the allopurinol.

## 2014-06-02 NOTE — Progress Notes (Signed)
CC: Follow  Up for chronic pain  Patient is also here for a manipulation. Patient does have chronic pain type syndrome. Patient has responded well to osteopathic manipulation previously. Patient states that some of her aches and pains has been better since she has started on the allopurinol. Patient did have a uric acid is 7.1 and since she's been on the medication she is down to 4.6. Patient still has talked late in her system and patient's urinary oxalate is down at 2.7. Patient continues with most of the natural vitamins. Patient did travel out of the country and now is doing the home exercises on a more regular basis. Patient is also watching her diet more and think she is improving. Patient has noticed that her feet as well as somewhat her hands has improved. Denies any significant worsening of any symptoms. Patient is being treated for fungal overgrowth but integrated medicine physician.  Past Medical History  Diagnosis Date  . Allergic rhinitis, cause unspecified   . Chronic pain   . GERD (gastroesophageal reflux disease)   . HTN (hypertension)   . Sleep apnea   . Abnormal uterine bleeding   . Dyspareunia   . Fibroid   . Urinary incontinence   . Kidney stones   . Bursitis of left shoulder    Past Surgical History  Procedure Laterality Date  . Umbilical hernia repair  2010  . Carpal tunnel release  1998  . Dental implants  2010  . Esophageal dilation  2010  . Hammer toe surgery      rt  . Septoplasty    . Rotator cuff repair Right 09/2011    Dr. Tamera Punt  . Pelvic laparoscopy  1990  .  dilatation and curettage  2000  . Rotator cuff repair Left 11/11/13    Dr. Tamera Punt    History  Substance Use Topics  . Smoking status: Never Smoker   . Smokeless tobacco: Never Used  . Alcohol Use: No   Allergies  Allergen Reactions  . Cefixime Swelling  . Celebrex [Celecoxib] Swelling  . Levofloxacin Other (See Comments)    Muscle and joint pain  . Other Other (See Comments)   Other Reaction: painful joints TEQUIN. TEQUIN - JOINT AND MUSCLE PAIN  . Penicillins Hives  . Robaxin [Methocarbamol]   . Tomato Hives  . Adhesive [Tape] Rash    Sensitive to EKG leads  . Tetracyclines & Related Rash   Family History  Problem Relation Age of Onset  . Rheumatologic disease Mother   . Hypertension Mother   . Hypertension Father   . Rheumatologic disease Sister   . Arthritis Sister   . Ovarian cancer Sister 40          Blood pressure 136/84, pulse 97, height 5' (1.524 m), weight 210 lb (95.255 kg), last menstrual period 01/16/2002, SpO2 97 %. General: No apparent distress alert and oriented x3 mood and affect normal Respiratory: Patient's recent full sentences and does not appear short of breath  Skin: Warm dry intact with no signs of infection or rash  Neuro: Cranial nerves II through XII are intact, neurovascularly intact in all extremities with 2+ DTRs and 2+ pulses.   Back Exam:  Inspection: Unremarkable  Motion: Flexion 45 deg, Extension 45 deg, Side Bending to 45 deg bilaterally,  Rotation to 45 deg bilaterally  SLR laying: Negative  XSLR laying: Negative  Palpable tenderness: None. FABER: negative. Sensory change: Gross sensation intact to all lumbar and sacral dermatomes.  Reflexes: 2+  at both patellar tendons, 2+ at achilles tendons, Babinski's downgoing.  Strength at foot  Plantar-flexion: 5/5 Dorsi-flexion: 5/5 Eversion: 5/5 Inversion: 5/5  Leg strength  Quad: 5/5 Hamstring: 5/5 Hip flexor: 5/5 Hip abductors: 5/5  Gait unremarkable.   OMT findings  Cervical C2 flexed rotated and side bent left C5 extended rotated and side bent left  thoracic  T1 E RS right  T6 extended rotated and side bent  right  Lumbar L2 flexed rotated and side bent right  Sacrum Left on left

## 2014-06-02 NOTE — Patient Instructions (Addendum)
Good to see you Welcome back Continue the B12 Increase allopurinol to 300mg  nightly.  Decrease calcium citrate.  Conitnue to monitor diet.  Get water checked and consider new filter See me again in 6 week.

## 2014-06-02 NOTE — Assessment & Plan Note (Signed)
Overall doing well. Discussed icing and HEp Discuss patient on watching her lifting limits. Patient will come back and see me again in 4-6 weeks for further evaluation and treatment.

## 2014-06-02 NOTE — Assessment & Plan Note (Signed)
Decision today to treat with OMT was based on Physical Exam  After verbal consent patient was treated with , ME, soft tissue techniques in cervical thoracic, lumbar and sacral areas  Patient tolerated the procedure well with improvement in symptoms  Patient given exercises, stretches and lifestyle modifications  See medications in patient instructions if given  Patient will follow up in 5-6  weeks

## 2014-06-02 NOTE — Progress Notes (Signed)
Pre visit review using our clinic review tool, if applicable. No additional management support is needed unless otherwise documented below in the visit note. 

## 2014-06-02 NOTE — Assessment & Plan Note (Signed)
Discussed diet changes in patient will continue to monitor.

## 2014-06-05 ENCOUNTER — Encounter: Payer: Self-pay | Admitting: Family Medicine

## 2014-06-09 DIAGNOSIS — R5383 Other fatigue: Secondary | ICD-10-CM | POA: Diagnosis not present

## 2014-06-09 DIAGNOSIS — R0600 Dyspnea, unspecified: Secondary | ICD-10-CM | POA: Diagnosis not present

## 2014-06-09 DIAGNOSIS — G4733 Obstructive sleep apnea (adult) (pediatric): Secondary | ICD-10-CM | POA: Diagnosis not present

## 2014-06-11 DIAGNOSIS — G4733 Obstructive sleep apnea (adult) (pediatric): Secondary | ICD-10-CM | POA: Diagnosis not present

## 2014-06-16 DIAGNOSIS — Z0000 Encounter for general adult medical examination without abnormal findings: Secondary | ICD-10-CM | POA: Diagnosis not present

## 2014-06-17 DIAGNOSIS — E119 Type 2 diabetes mellitus without complications: Secondary | ICD-10-CM | POA: Diagnosis not present

## 2014-06-17 DIAGNOSIS — Z09 Encounter for follow-up examination after completed treatment for conditions other than malignant neoplasm: Secondary | ICD-10-CM | POA: Diagnosis not present

## 2014-06-17 DIAGNOSIS — E559 Vitamin D deficiency, unspecified: Secondary | ICD-10-CM | POA: Diagnosis not present

## 2014-06-24 ENCOUNTER — Ambulatory Visit (INDEPENDENT_AMBULATORY_CARE_PROVIDER_SITE_OTHER): Payer: Medicare Other | Admitting: Obstetrics and Gynecology

## 2014-06-24 ENCOUNTER — Encounter: Payer: Self-pay | Admitting: Obstetrics and Gynecology

## 2014-06-24 VITALS — BP 138/68 | HR 80 | Ht 61.0 in | Wt 207.0 lb

## 2014-06-24 DIAGNOSIS — R938 Abnormal findings on diagnostic imaging of other specified body structures: Secondary | ICD-10-CM | POA: Diagnosis not present

## 2014-06-24 DIAGNOSIS — Z124 Encounter for screening for malignant neoplasm of cervix: Secondary | ICD-10-CM

## 2014-06-24 DIAGNOSIS — Z8041 Family history of malignant neoplasm of ovary: Secondary | ICD-10-CM

## 2014-06-24 DIAGNOSIS — Z01419 Encounter for gynecological examination (general) (routine) without abnormal findings: Secondary | ICD-10-CM

## 2014-06-24 DIAGNOSIS — Z0000 Encounter for general adult medical examination without abnormal findings: Secondary | ICD-10-CM

## 2014-06-24 DIAGNOSIS — N3946 Mixed incontinence: Secondary | ICD-10-CM | POA: Diagnosis not present

## 2014-06-24 DIAGNOSIS — R9389 Abnormal findings on diagnostic imaging of other specified body structures: Secondary | ICD-10-CM

## 2014-06-24 NOTE — Progress Notes (Signed)
64 y.o. G15P2002 Married Caucasian female here for annual exam.    Has had evaluation this year for potential postmenopausal bleeding versus hematuria and family history of ovarian cancer.  Had ultrasound 02/19/14 -  Uterus - 2.8 cm fundal fibroid, 2.3 cm cervical fibroid. EMS - 5.61 mm on transvaginal scan.  Ovaries normal.  No free fluid.  Unable to do an EMB in the office due to inability to get in canal.  Cervical fibroid causing stenosis.   Has blood tinged urine on occasion.  States she does not present for care when this happens.  Fairly recent dx of kidney stones.  Saw urology last had and had cystoscopy.  Has difficulty to stop urine once it starts to flow.  Leaks with cough and sneeze. Wears pads.  Some external irritation.   PCP:   Carlus Pavlov, MD  Patient's last menstrual period was 01/16/2002.          Sexually active: No.  The current method of family planning is post menopausal status.    Exercising: Yes.    Gardening, water aerobics Smoker:  no  Health Maintenance: Pap:  05/06/12 wnl neg hr hpv History of abnormal Pap:  no MMG:  09/11/13 breast density category b; bi-rads 1: neg Colonoscopy:  2009 or 2011 BMD: 09/10/13   Result: Normal TDaP:  2007 per patient.  Did with [facility] or PCP prior to international travel.  Screening Labs:  Hb today: no, Urine today: Negative   reports that she has never smoked. She has never used smokeless tobacco. She reports that she does not drink alcohol or use illicit drugs.  Past Medical History  Diagnosis Date  . Allergic rhinitis, cause unspecified   . Chronic pain   . GERD (gastroesophageal reflux disease)   . HTN (hypertension)   . Sleep apnea   . Abnormal uterine bleeding   . Dyspareunia   . Fibroid   . Urinary incontinence   . Kidney stones   . Bursitis of left shoulder     Past Surgical History  Procedure Laterality Date  . Umbilical hernia repair  2010  . Carpal tunnel release  1998  . Dental  implants  2010  . Esophageal dilation  2010  . Hammer toe surgery      rt  . Septoplasty    . Rotator cuff repair Right 09/2011    Dr. Tamera Punt  . Pelvic laparoscopy  1990  .  dilatation and curettage  2000  . Rotator cuff repair Left 11/11/13    Dr. Tamera Punt     Current Outpatient Prescriptions  Medication Sig Dispense Refill  . allopurinol (ZYLOPRIM) 300 MG tablet Take 1 tablet (300 mg total) by mouth at bedtime. 90 tablet 1  . cyanocobalamin (,VITAMIN B-12,) 1000 MCG/ML injection Do an injection weekly for the first 4 weeks then monthly thereafter. (Patient taking differently: Inject into the muscle every 30 (thirty) days. Do an injection weekly for the first 4 weeks then monthly thereafter.) 10 mL 1  . Needles & Syringes MISC 1 Can by Does not apply route once a week. Dispense meals for B12 IM injections please thank you 10 each 1  . Omega-3 Fatty Acids (FISH OIL) 1000 MG CAPS Take 1,000 mg by mouth daily.    . Probiotic Product (PROBIOTIC DAILY PO) Take 1 capsule by mouth daily.    Marland Kitchen pyridOXINE (B-6) 50 MG tablet Take 50 mg by mouth daily.    . Turmeric 500 MG CAPS Take 1 capsule  by mouth 2 (two) times daily.    . vitamin C (ASCORBIC ACID) 500 MG tablet Take by mouth daily.     . Zinc Gluconate 50 MG CAPS Take 1 capsule by mouth 2 (two) times daily.    Marland Kitchen allopurinol (ZYLOPRIM) 100 MG tablet     . Calcium 150 MG TABS Take 2 tablets by mouth as needed.     . Cholecalciferol 5000 UNITS capsule Take 10,000 Units by mouth daily.     . Cyanocobalamin-Salcaprozate (ELIGEN B12) 1000-100 MCG-MG TABS Take 1 tablet by mouth daily. (Patient not taking: Reported on 06/24/2014) 90 tablet 3  . fluconazole (DIFLUCAN) 200 MG tablet     . mefloquine (LARIAM) 250 MG tablet Take 250 mg by mouth every 7 (seven) days.    Marland Kitchen VIVOTIF DR capsule TAKE 1 CAPSULE BY MOUTH EVERY OTHER DAY FOR 4 DOSES. TAKE 1 HOUR BEFORE EATING. BEGIN ATLEAST 2 WEEKS BEFORE TRAVEL  0  . XIFAXAN 550 MG TABS tablet      No current  facility-administered medications for this visit.    Family History  Problem Relation Age of Onset  . Rheumatologic disease Mother   . Hypertension Mother   . Hypertension Father   . Rheumatologic disease Sister   . Arthritis Sister   . Ovarian cancer Sister 62    ROS:  Pertinent items are noted in HPI.  Otherwise, a comprehensive ROS was negative.  Exam:   BP 138/68 mmHg  Pulse 80  Ht 5\' 1"  (1.549 m)  Wt 207 lb (93.895 kg)  BMI 39.13 kg/m2  LMP 01/16/2002    General appearance: alert, cooperative and appears stated age Head: Normocephalic, without obvious abnormality, atraumatic Neck: no adenopathy, supple, symmetrical, trachea midline and thyroid normal to inspection and palpation Lungs: clear to auscultation bilaterally Breasts: normal appearance, no masses or tenderness, Inspection negative, No nipple retraction or dimpling, No nipple discharge or bleeding, No axillary or supraclavicular adenopathy Heart: regular rate and rhythm Abdomen: obese, soft, non-tender; bowel sounds normal; no masses,  no organomegaly Extremities: extremities normal, atraumatic, no cyanosis or edema Skin: Skin color, texture, turgor normal. No rashes or lesions Lymph nodes: Cervical, supraclavicular, and axillary nodes normal. No abnormal inguinal nodes palpated Neurologic: Grossly normal  Pelvic: External genitalia:  no lesions              Urethra:  normal appearing urethra with no masses, tenderness or lesions              Bartholins and Skenes: normal                 Vagina: normal appearing vagina with normal color and discharge, no lesions              Cervix: no lesions              Pap taken: Yes.   Bimanual Exam:  Uterus:  normal size, contour, position, consistency, mobility, non-tender.  Exam limited by body habitus.              Adnexa: normal adnexa and no mass, fullness, tenderness              Rectovaginal: Yes.  .  Confirms.              Anus:  normal sphincter tone, no  lesions  Chaperone was present for exam.  Assessment:   Well woman visit with normal exam. FH of ovarian cancer.  Borderline EMS. Cervical fibroid and stenosis.  Mixed incontinence.  Vulvitis.   Plan: Yearly mammogram recommended after age 42.  Recommended self breast exam.  Pap and HR HPV as above. Discussed Calcium, Vitamin D, regular exercise program including cardiovascular and weight bearing exercise. Kegel's. Referral to Ileana Roup for pelvic floor therapy.  I recommend evaluation for blood in the urine if this were to occur again.  I recommend evaluation if postmenopausal bleeding occurs. Yearly pelvic ultrasound. Avoid pads with perfumes and local vulvar irritants. Labs performed.  No..   See orders. Refills given on medications.  No..  See orders. Follow up annually and prn.      After visit summary provided.

## 2014-06-25 LAB — IPS PAP SMEAR ONLY

## 2014-07-02 ENCOUNTER — Other Ambulatory Visit: Payer: Self-pay | Admitting: Family Medicine

## 2014-07-02 NOTE — Telephone Encounter (Signed)
Refill done.  

## 2014-07-14 ENCOUNTER — Ambulatory Visit: Payer: Medicare Other | Admitting: Family Medicine

## 2014-07-21 ENCOUNTER — Encounter: Payer: Self-pay | Admitting: Family Medicine

## 2014-07-21 ENCOUNTER — Telehealth: Payer: Self-pay | Admitting: Obstetrics and Gynecology

## 2014-07-21 ENCOUNTER — Ambulatory Visit (INDEPENDENT_AMBULATORY_CARE_PROVIDER_SITE_OTHER): Payer: Medicare Other | Admitting: Family Medicine

## 2014-07-21 VITALS — BP 114/82 | HR 78 | Wt 206.0 lb

## 2014-07-21 DIAGNOSIS — M9901 Segmental and somatic dysfunction of cervical region: Secondary | ICD-10-CM

## 2014-07-21 DIAGNOSIS — M999 Biomechanical lesion, unspecified: Secondary | ICD-10-CM

## 2014-07-21 DIAGNOSIS — M1009 Idiopathic gout, multiple sites: Secondary | ICD-10-CM

## 2014-07-21 DIAGNOSIS — G8929 Other chronic pain: Secondary | ICD-10-CM | POA: Diagnosis not present

## 2014-07-21 DIAGNOSIS — M9903 Segmental and somatic dysfunction of lumbar region: Secondary | ICD-10-CM

## 2014-07-21 DIAGNOSIS — M1711 Unilateral primary osteoarthritis, right knee: Secondary | ICD-10-CM

## 2014-07-21 DIAGNOSIS — M109 Gout, unspecified: Secondary | ICD-10-CM

## 2014-07-21 DIAGNOSIS — M9908 Segmental and somatic dysfunction of rib cage: Secondary | ICD-10-CM | POA: Diagnosis not present

## 2014-07-21 NOTE — Progress Notes (Signed)
Pre visit review using our clinic review tool, if applicable. No additional management support is needed unless otherwise documented below in the visit note. 

## 2014-07-21 NOTE — Progress Notes (Signed)
CC: Follow  Up for chronic pain  Patient is also here for a manipulation. Patient does have chronic pain type syndrome. Patient has responded well to osteopathic manipulation previously. Patient states that some of her aches and pains has been better since she has started on the allopurinol. Patient did have a uric acid is 7.1 and since she's been on the medication she is down to 4.6. Patient was to go up on her allopurinol. Patient states she thinks some of her muscle pain is worse from this. Patient also restarted her vitamin D. Patient states that overall she seems to be doing relatively well but when she does increasing activity such as gardening recently she noticed more pain in all her joints including her ankles. States that the right shoulder continues to give her some discomfort as well. Mostly on the superior aspect. Not like her rotator cuff different. Overall this continues to have the same chronic pains.  Right knee with known severe osteophytic changes keeps giving her trouble. Patient was given a custom brace but is only wearing it intermittently. Still having some mild instability. Patient states though she wears the brace she has some discomfort as well.   Past Medical History  Diagnosis Date  . Allergic rhinitis, cause unspecified   . Chronic pain   . GERD (gastroesophageal reflux disease)   . HTN (hypertension)   . Sleep apnea   . Abnormal uterine bleeding   . Dyspareunia   . Fibroid   . Urinary incontinence   . Kidney stones   . Bursitis of left shoulder    Past Surgical History  Procedure Laterality Date  . Umbilical hernia repair  2010  . Carpal tunnel release  1998  . Dental implants  2010  . Esophageal dilation  2010  . Hammer toe surgery      rt  . Septoplasty    . Rotator cuff repair Right 09/2011    Dr. Tamera Punt  . Pelvic laparoscopy  1990  .  dilatation and curettage  2000  . Rotator cuff repair Left 11/11/13    Dr. Tamera Punt    History  Substance Use  Topics  . Smoking status: Never Smoker   . Smokeless tobacco: Never Used  . Alcohol Use: No   Allergies  Allergen Reactions  . Cefixime Swelling  . Celebrex [Celecoxib] Swelling  . Levofloxacin Other (See Comments)    Muscle and joint pain  . Other Other (See Comments)    Other Reaction: painful joints TEQUIN. TEQUIN - JOINT AND MUSCLE PAIN  . Penicillins Hives  . Robaxin [Methocarbamol]   . Tomato Hives  . Adhesive [Tape] Rash    Sensitive to EKG leads  . Tetracyclines & Related Rash   Family History  Problem Relation Age of Onset  . Rheumatologic disease Mother   . Hypertension Mother   . Hypertension Father   . Rheumatologic disease Sister   . Arthritis Sister   . Ovarian cancer Sister 52     Blood pressure 114/82, pulse 78, weight 206 lb (93.441 kg), last menstrual period 01/16/2002, SpO2 96 %. General: No apparent distress alert and oriented x3 mood and affect normal Respiratory: Patient's recent full sentences and does not appear short of breath  Skin: Warm dry intact with no signs of infection or rash  Neuro: Cranial nerves II through XII are intact, neurovascularly intact in all extremities with 2+ DTRs and 2+ pulses.   Back Exam:  Inspection: Unremarkable  Motion: Flexion 45 deg, Extension 45  deg, Side Bending to 45 deg bilaterally,  Rotation to 45 deg bilaterally  SLR laying: Negative  XSLR laying: Negative  Palpable tenderness: None. FABER: negative. Sensory change: Gross sensation intact to all lumbar and sacral dermatomes.  Reflexes: 2+ at both patellar tendons, 2+ at achilles tendons, Babinski's downgoing.  Strength at foot  Plantar-flexion: 5/5 Dorsi-flexion: 5/5 Eversion: 5/5 Inversion: 5/5  Leg strength  Quad: 5/5 Hamstring: 5/5 Hip flexor: 5/5 Hip abductors: 4/5  Gait unremarkable.  Knee: Right August deformity noted Severe tenderness over the medial joint line Lacks last 5 of extension Ligaments with solid consistent endpoints including  ACL, PCL, LCL, MCL. Negative Mcmurray's, Apley's, and Thessalonian tests. Non painful patellar compression. Patellar glide without crepitus. Patellar and quadriceps tendons unremarkable. Hamstring and quadriceps strength is normal.    OMT findings  Cervical C2 flexed rotated and side bent left C5 extended rotated and side bent left  thoracic  T1 E RS right  T6 extended rotated and side bent  right  Lumbar L2 flexed rotated and side bent right  Sacrum Left on left

## 2014-07-21 NOTE — Assessment & Plan Note (Signed)
Did increase her pain levels when she went up to 300 mg. Patient going back to 200 mg. We discussed icing regimen and home exercise. Patient will come back and see me again in 4-6 weeks.

## 2014-07-21 NOTE — Assessment & Plan Note (Signed)
We'll continue to monitor. If any worsening symptoms we can always consider an injection. Patient hasn't wanted to avoid this due to her many allergic reactions to medications.

## 2014-07-21 NOTE — Assessment & Plan Note (Signed)
Decision today to treat with OMT was based on Physical Exam  After verbal consent patient was treated with , ME, soft tissue techniques in cervical thoracic, lumbar and sacral areas  Patient tolerated the procedure well with improvement in symptoms  Patient given exercises, stretches and lifestyle modifications  See medications in patient instructions if given  Patient will follow up in 4-6  weeks

## 2014-07-21 NOTE — Patient Instructions (Addendum)
Continue the vitamin d.  If pain continues then would try allopurinol 200mg  daily for 1 week.  If no change again then go down to vitamin D 3 times a week.  The ankles try moving every 3 minutes march 10 steps COnitnue everything else Try arnica  on the [facility] joint of the shoulder See me again in 4 weeks.   STRENGTH - Shoulder Abductors, Isometric  With good posture, stand or sit about 4-6 inches from a wall with your right / left side facing the wall.  Bend your right / left elbow. Gently press your elbow into the wall. Increase the pressure gradually until you are pressing as hard as you can, without shrugging your shoulder or increasing any shoulder discomfort.  Hold __________ seconds.  Release the tension slowly. Relax your shoulder muscles completely before you start the next repetition. Repeat __________ times. Complete this exercise __________ times per day. STRENGTH - Internal Rotators, Isometric  Keep your right / left elbow at your side and bend it 90 degrees.  Step into a door frame so that the inside of your wrist can press against the door frame without your upper arm leaving your side.  Gently press your right / left wrist into the door frame as if you were trying to draw the palm of your hand to your stomach. Gradually increase the tension until you are pressing as hard as you can, without shrugging your shoulder or increasing any shoulder discomfort.  Hold __________ seconds.  Release the tension slowly. Relax your shoulder muscles completely before you start the next repetition. Repeat __________ times. Complete this exercise __________ times per day. STRENGTH - External Rotators, Isometric  Keep your right / left elbow at your side and bend it 90 degrees.  Step into a door frame so that the outside of your right / left wrist can press against the door frame without your upper arm leaving your side.  Gently press your right / left wrist into the door frame as if you  were trying to swing the back of your hand away from your stomach. Gradually increase the tension until you are pressing as hard as you can, without shrugging your shoulder or increasing any shoulder discomfort.  Hold __________ seconds.  Release the tension slowly. Relax your shoulder muscles completely before you start the next repetition. Repeat __________ times. Complete this exercise __________ times per day.  STRENGTH - Internal Rotators  Secure a rubber exercise band or tubing to a fixed object (table, pole) so that it is at the same height as your right / left elbow when you are standing or sitting on a firm surface.  Stand or sit so that the secured exercise band is at your right / left side.  Bend your elbow 90 degrees. Place a folded towel or small pillow under your right / left arm so that your elbow is a few inches away from your side.  Keeping the tension on the exercise band, pull it across your body toward your stomach. Be sure to keep your body steady so that the movement is coming only from your rotating shoulder.  Hold __________ seconds. Release the tension in a controlled manner, as you return to the starting position. Repeat __________ times. Complete this exercise __________ times per day. STRENGTH - External Rotators  Secure a rubber exercise band to a fixed object, so that it is at the same height as your right / left elbow when you are standing or sitting on  a firm surface.  Stand or sit so that the secured exercise band is at your uninjured side.  Bend your elbow 90 degrees. Place a folded towel or small pillow under your right / left arm so that your elbow is a few inches away from your side.  Keeping the tension on the exercise band, pull it away from your body, as if pivoting on your elbow. Be sure to keep your body steady, so that the movement is coming only from your rotating shoulder.  Hold __________ seconds. Release the tension in a controlled manner, as  you return to the starting position. Repeat __________ times. Complete this exercise __________ times per day. Document Released: 08/03/2004 Document Revised: 03/27/2011 Document Reviewed: 04/16/2008 [facility] Patient Information 2015 [place], [place]. This information is not intended to replace advice given to you by your health care provider. Make sure you discuss any questions you have with your health care provider.

## 2014-07-21 NOTE — Assessment & Plan Note (Signed)
Patient continues to have some difficult he'll overall. Patient though does respond well to osteopathic manipulation which she had done again today. Patient encouraged to continue the exercises and continue potentially weight loss as well as the over-the-counter natural supplementation. We discussed some changes and before meals patient instructions. Patient will continue to monitor closely. Patient come back and see me again in 4-6 weeks for further evaluation.

## 2014-07-21 NOTE — Telephone Encounter (Signed)
Left voicemail regarding referral appointment. [facility] has attempted once to contact patient for appointment. Calling patient as a follow up so they may contact [facility] to schedule appointment.

## 2014-08-03 DIAGNOSIS — H25813 Combined forms of age-related cataract, bilateral: Secondary | ICD-10-CM | POA: Diagnosis not present

## 2014-08-17 ENCOUNTER — Encounter: Payer: Self-pay | Admitting: Family Medicine

## 2014-08-17 ENCOUNTER — Other Ambulatory Visit: Payer: Self-pay | Admitting: Family Medicine

## 2014-08-17 DIAGNOSIS — M79672 Pain in left foot: Secondary | ICD-10-CM

## 2014-08-18 ENCOUNTER — Ambulatory Visit (INDEPENDENT_AMBULATORY_CARE_PROVIDER_SITE_OTHER): Payer: Medicare Other | Admitting: Family Medicine

## 2014-08-18 ENCOUNTER — Encounter: Payer: Self-pay | Admitting: Family Medicine

## 2014-08-18 ENCOUNTER — Ambulatory Visit (INDEPENDENT_AMBULATORY_CARE_PROVIDER_SITE_OTHER)
Admission: RE | Admit: 2014-08-18 | Discharge: 2014-08-18 | Disposition: A | Discharge status: Home / Self Care | Payer: Medicare Other | Source: Ambulatory Visit | Attending: Family Medicine | Admitting: Family Medicine

## 2014-08-18 VITALS — BP 132/72 | HR 96 | Ht 61.0 in | Wt 200.0 lb

## 2014-08-18 DIAGNOSIS — M999 Biomechanical lesion, unspecified: Secondary | ICD-10-CM

## 2014-08-18 DIAGNOSIS — M79671 Pain in right foot: Secondary | ICD-10-CM

## 2014-08-18 DIAGNOSIS — M9908 Segmental and somatic dysfunction of rib cage: Secondary | ICD-10-CM

## 2014-08-18 DIAGNOSIS — M9903 Segmental and somatic dysfunction of lumbar region: Secondary | ICD-10-CM | POA: Diagnosis not present

## 2014-08-18 DIAGNOSIS — M19072 Primary osteoarthritis, left ankle and foot: Secondary | ICD-10-CM | POA: Diagnosis not present

## 2014-08-18 DIAGNOSIS — M2550 Pain in unspecified joint: Secondary | ICD-10-CM | POA: Diagnosis not present

## 2014-08-18 DIAGNOSIS — M79672 Pain in left foot: Secondary | ICD-10-CM

## 2014-08-18 DIAGNOSIS — M9901 Segmental and somatic dysfunction of cervical region: Secondary | ICD-10-CM

## 2014-08-18 NOTE — Assessment & Plan Note (Signed)
Continues to having difficulty overall. Patient continues to have significant pain and has significant osteophytic changes. Uric acid or oxalate could be contributing. Patient's continues respond fairly well to osteopathic manipulation. If any of this is tophi formation we may went to consider other treatment options patient patient was unable to tolerate the allopurinol at higher dose.

## 2014-08-18 NOTE — Progress Notes (Signed)
Pre visit review using our clinic review tool, if applicable. No additional management support is needed unless otherwise documented below in the visit note. 

## 2014-08-18 NOTE — Assessment & Plan Note (Signed)
Decision today to treat with OMT was based on Physical Exam  After verbal consent patient was treated with , ME, soft tissue techniques in cervical thoracic, lumbar and sacral areas  Patient tolerated the procedure well with improvement in symptoms  Patient given exercises, stretches and lifestyle modifications  See medications in patient instructions if given  Patient will follow up in 4-6  weeks

## 2014-08-18 NOTE — Progress Notes (Signed)
CC: Follow  Up for chronic pain  Patient is also here for a manipulation. Patient does have chronic pain type syndrome. Patient has responded well to osteopathic manipulation previously. Patient continues to natural supplementations as well as the vitamin D. Patient overall has been doing fairly well in responding to osteopathic manipulation.  Right knee with known severe osteophytic changes keeps giving her trouble.  Patient was given a custom brace but is only wearing it intermittently. Still having some mild instability. Patient states though she wears the brace she has some discomfort as well.  Patient has had some left foot pain for quite some time. Patient does have known pes planus and has been in custom orthotics previously.  Continues to have left foot pain severe in midfoot on the top of the foot. No swelling, not improving.    Past Medical History  Diagnosis Date  . Allergic rhinitis, cause unspecified   . Chronic pain   . GERD (gastroesophageal reflux disease)   . HTN (hypertension)   . Sleep apnea   . Abnormal uterine bleeding   . Dyspareunia   . Fibroid   . Urinary incontinence   . Kidney stones   . Bursitis of left shoulder    Past Surgical History  Procedure Laterality Date  . Umbilical hernia repair  2010  . Carpal tunnel release  1998  . Dental implants  2010  . Esophageal dilation  2010  . Hammer toe surgery      rt  . Septoplasty    . Rotator cuff repair Right 09/2011    Dr. Tamera Punt  . Pelvic laparoscopy  1990  .  dilatation and curettage  2000  . Rotator cuff repair Left 11/11/13    Dr. Tamera Punt    History  Substance Use Topics  . Smoking status: Never Smoker   . Smokeless tobacco: Never Used  . Alcohol Use: No   Allergies  Allergen Reactions  . Cefixime Swelling  . Celebrex [Celecoxib] Swelling  . Levofloxacin Other (See Comments)    Muscle and joint pain  . Other Other (See Comments)    Other Reaction: painful joints TEQUIN. TEQUIN - JOINT  AND MUSCLE PAIN  . Penicillins Hives  . Robaxin [Methocarbamol]   . Tomato Hives  . Adhesive [Tape] Rash    Sensitive to EKG leads  . Tetracyclines & Related Rash   Family History  Problem Relation Age of Onset  . Rheumatologic disease Mother   . Hypertension Mother   . Hypertension Father   . Rheumatologic disease Sister   . Arthritis Sister   . Ovarian cancer Sister 46     Last menstrual period 01/16/2002. General: No apparent distress alert and oriented x3 mood and affect normal Respiratory: Patient's recent full sentences and does not appear short of breath  Skin: Warm dry intact with no signs of infection or rash  Neuro: Cranial nerves II through XII are intact, neurovascularly intact in all extremities with 2+ DTRs and 2+ pulses.   Foot exam shows the patient doesn't pes planus bilaterally with oversupination of the hindfoot and mild metatarsal abduction bilaterally. Patient is severely tender to palpation over the midfoot on the left foot. Back Exam:  Inspection: Unremarkable  Motion: Flexion 45 deg, Extension 45 deg, Side Bending to 45 deg bilaterally,  Rotation to 45 deg bilaterally  SLR laying: Negative  XSLR laying: Negative  Palpable tenderness: None. FABER: negative. Sensory change: Gross sensation intact to all lumbar and sacral dermatomes.  Reflexes: 2+ at  both patellar tendons, 2+ at achilles tendons, Babinski's downgoing.  Strength at foot  Plantar-flexion: 5/5 Dorsi-flexion: 5/5 Eversion: 5/5 Inversion: 5/5  Leg strength  Quad: 5/5 Hamstring: 5/5 Hip flexor: 5/5 Hip abductors: 4/5  Gait unremarkable.   OMT findings  Cervical C2 flexed rotated and side bent left C5 extended rotated and side bent left  thoracic  T1 E RS right  T6 extended rotated and side bent  right  Lumbar L2 flexed rotated and side bent right  Sacrum Left on left

## 2014-08-18 NOTE — Assessment & Plan Note (Signed)
Severe arthritis of the feet bilaterally left couldn't right. I think that this is contribute in. We discussed proper shoewear. We discussed the possibility of bisphosphonates which patient declined. We also discussed the possibility of injections with patient declined. Patient continues to have discomfort she may need to be seen by a podiatrist.

## 2014-08-18 NOTE — Patient Instructions (Signed)
Good to see you Ice is your friend when needed and if you can tolerate [place] and Sabino Gasser look into them and tyr them Dr. Karie Georges gellin inserts and at some point bring them in and Leveda Anna can cut them to size.  Look at some other vibration equipment if you want  I will look into the infusion for the uric acid and see what I can find out.  With the exercises 3 sets of 10 reps 3 times a week.  See me again in 4 weeks.

## 2014-08-21 DIAGNOSIS — L308 Other specified dermatitis: Secondary | ICD-10-CM | POA: Diagnosis not present

## 2014-08-21 DIAGNOSIS — L570 Actinic keratosis: Secondary | ICD-10-CM | POA: Diagnosis not present

## 2014-08-21 DIAGNOSIS — E559 Vitamin D deficiency, unspecified: Secondary | ICD-10-CM | POA: Diagnosis not present

## 2014-08-21 DIAGNOSIS — E509 Vitamin A deficiency, unspecified: Secondary | ICD-10-CM | POA: Diagnosis not present

## 2014-08-21 DIAGNOSIS — M1389 Other specified arthritis, multiple sites: Secondary | ICD-10-CM | POA: Diagnosis not present

## 2014-08-31 ENCOUNTER — Encounter: Payer: Self-pay | Admitting: Family Medicine

## 2014-09-01 DIAGNOSIS — R102 Pelvic and perineal pain: Secondary | ICD-10-CM | POA: Diagnosis not present

## 2014-09-01 DIAGNOSIS — R32 Unspecified urinary incontinence: Secondary | ICD-10-CM | POA: Diagnosis not present

## 2014-09-01 DIAGNOSIS — M6281 Muscle weakness (generalized): Secondary | ICD-10-CM | POA: Diagnosis not present

## 2014-09-01 DIAGNOSIS — R278 Other lack of coordination: Secondary | ICD-10-CM | POA: Diagnosis not present

## 2014-09-07 ENCOUNTER — Encounter: Payer: Self-pay | Admitting: Family Medicine

## 2014-09-07 MED ORDER — ALLOPURINOL 100 MG PO TABS: 100.0000 mg | ORAL_TABLET | Freq: Three times a day (TID) | ORAL | Status: DC

## 2014-09-08 DIAGNOSIS — R102 Pelvic and perineal pain: Secondary | ICD-10-CM | POA: Diagnosis not present

## 2014-09-08 DIAGNOSIS — M6281 Muscle weakness (generalized): Secondary | ICD-10-CM | POA: Diagnosis not present

## 2014-09-08 DIAGNOSIS — R32 Unspecified urinary incontinence: Secondary | ICD-10-CM | POA: Diagnosis not present

## 2014-09-08 DIAGNOSIS — R278 Other lack of coordination: Secondary | ICD-10-CM | POA: Diagnosis not present

## 2014-09-15 ENCOUNTER — Ambulatory Visit (INDEPENDENT_AMBULATORY_CARE_PROVIDER_SITE_OTHER): Payer: Medicare Other | Admitting: Family Medicine

## 2014-09-15 ENCOUNTER — Encounter: Payer: Self-pay | Admitting: Family Medicine

## 2014-09-15 VITALS — BP 136/78 | HR 86 | Wt 196.0 lb

## 2014-09-15 DIAGNOSIS — M545 Low back pain, unspecified: Secondary | ICD-10-CM

## 2014-09-15 DIAGNOSIS — M999 Biomechanical lesion, unspecified: Secondary | ICD-10-CM

## 2014-09-15 DIAGNOSIS — M9903 Segmental and somatic dysfunction of lumbar region: Secondary | ICD-10-CM

## 2014-09-15 DIAGNOSIS — M6281 Muscle weakness (generalized): Secondary | ICD-10-CM | POA: Diagnosis not present

## 2014-09-15 DIAGNOSIS — M1009 Idiopathic gout, multiple sites: Secondary | ICD-10-CM

## 2014-09-15 DIAGNOSIS — M109 Gout, unspecified: Secondary | ICD-10-CM

## 2014-09-15 DIAGNOSIS — R102 Pelvic and perineal pain: Secondary | ICD-10-CM | POA: Diagnosis not present

## 2014-09-15 DIAGNOSIS — M9908 Segmental and somatic dysfunction of rib cage: Secondary | ICD-10-CM

## 2014-09-15 DIAGNOSIS — M9901 Segmental and somatic dysfunction of cervical region: Secondary | ICD-10-CM

## 2014-09-15 DIAGNOSIS — R32 Unspecified urinary incontinence: Secondary | ICD-10-CM | POA: Diagnosis not present

## 2014-09-15 DIAGNOSIS — R278 Other lack of coordination: Secondary | ICD-10-CM | POA: Diagnosis not present

## 2014-09-15 MED ORDER — VITAMIN D (ERGOCALCIFEROL) 1.25 MG (50000 UNIT) PO CAPS: 50000.0000 [IU] | ORAL_CAPSULE | ORAL | Status: DC

## 2014-09-15 MED ORDER — NEEDLES & SYRINGES MISC: 1.0000 | Status: DC

## 2014-09-15 NOTE — Assessment & Plan Note (Signed)
Continue allopurinol at 300 mg.

## 2014-09-15 NOTE — Progress Notes (Signed)
Pre visit review using our clinic review tool, if applicable. No additional management support is needed unless otherwise documented below in the visit note. 

## 2014-09-15 NOTE — Progress Notes (Signed)
CC: Follow  Up for chronic pain  Patient is also here for a manipulation. Patient does have chronic pain type syndrome. Patient has responded well to osteopathic manipulation previously. Patient continues to natural supplementations as well as the vitamin D. Patient overall has been doing fairly well in responding to osteopathic manipulation.  Right knee with known severe osteophytic changes keeps giving her trouble.patient denies though that it is giving her any significant trouble more than usual.patient has lost weight and has been doing relatively well. Got new shoes which is helped some of her low back pain. Patient has seen another provider being treated for yeast overgrowth and has noticed improvement. Patient has had high oxalate and as well as uric acid and has been treated with allopurinol and is noticing some mild improvement. Wt Readings from Last 3 Encounters:  09/15/14 196 lb (88.905 kg)  08/18/14 200 lb (90.719 kg)  07/21/14 206 lb (93.441 kg)        Past Medical History  Diagnosis Date  . Allergic rhinitis, cause unspecified   . Chronic pain   . GERD (gastroesophageal reflux disease)   . HTN (hypertension)   . Sleep apnea   . Abnormal uterine bleeding   . Dyspareunia   . Fibroid   . Urinary incontinence   . Kidney stones   . Bursitis of left shoulder    Past Surgical History  Procedure Laterality Date  . Umbilical hernia repair  2010  . Carpal tunnel release  1998  . Dental implants  2010  . Esophageal dilation  2010  . Hammer toe surgery      rt  . Septoplasty    . Rotator cuff repair Right 09/2011    Dr. Tamera Punt  . Pelvic laparoscopy  1990  .  dilatation and curettage  2000  . Rotator cuff repair Left 11/11/13    Dr. Tamera Punt    Social History  Substance Use Topics  . Smoking status: Never Smoker   . Smokeless tobacco: Never Used  . Alcohol Use: No   Allergies  Allergen Reactions  . Cefixime Swelling  . Celebrex [Celecoxib] Swelling  .  Levofloxacin Other (See Comments)    Muscle and joint pain  . Other Other (See Comments)    Other Reaction: painful joints TEQUIN. TEQUIN - JOINT AND MUSCLE PAIN  . Penicillins Hives  . Robaxin [Methocarbamol]   . Tomato Hives  . Adhesive [Tape] Rash    Sensitive to EKG leads  . Tetracyclines & Related Rash   Family History  Problem Relation Age of Onset  . Rheumatologic disease Mother   . Hypertension Mother   . Hypertension Father   . Rheumatologic disease Sister   . Arthritis Sister   . Ovarian cancer Sister 108     Last menstrual period 01/16/2002. General: No apparent distress alert and oriented x3 mood and affect normal Respiratory: Patient's recent full sentences and does not appear short of breath  Skin: Warm dry intact with no signs of infection or rash  Neuro: Cranial nerves II through XII are intact, neurovascularly intact in all extremities with 2+ DTRs and 2+ pulses.   Foot exam shows the patient doesn't pes planus bilaterally with oversupination of the hindfoot and mild metatarsal abduction bilaterally. Patient is severely tender to palpation over the midfoot on the left foot. Back Exam:  Inspection: Unremarkable  Motion: Flexion 45 deg, Extension 45 deg, Side Bending to 45 deg bilaterally,  Rotation to 45 deg bilaterally  SLR laying: Negative  XSLR laying: Negative  Palpable tenderness: None. FABER: negative. Sensory change: Gross sensation intact to all lumbar and sacral dermatomes.  Reflexes: 2+ at both patellar tendons, 2+ at achilles tendons, Babinski's downgoing.  Strength at foot  Plantar-flexion: 5/5 Dorsi-flexion: 5/5 Eversion: 5/5 Inversion: 5/5  Leg strength  Quad: 5/5 Hamstring: 5/5 Hip flexor: 5/5 Hip abductors: 4/5  Gait unremarkable.   OMT findings  Cervical C2 flexed rotated and side bent left C5 extended rotated and side bent left  thoracic  T1 E RS right  T6 extended rotated and side bent  right  Lumbar L2 flexed rotated and side  bent right  Sacrum Left on left No change from previous exam

## 2014-09-15 NOTE — Assessment & Plan Note (Signed)
Decision today to treat with OMT was based on Physical Exam  After verbal consent patient was treated with , ME, soft tissue techniques in cervical thoracic, lumbar and sacral areas  Patient tolerated the procedure well with improvement in symptoms  Patient given exercises, stretches and lifestyle modifications  See medications in patient instructions if given  Patient will follow up in 4  weeks

## 2014-09-15 NOTE — Patient Instructions (Addendum)
Always good to see you Bump up the vitamin D 50000 weekly for next 12 weeks.  Continue the allopurinol unless you like the sound of the uloric.  Love the shoes but look into xelera and put tennis ball at night I like the idea of anti fungal Anytime with the allopurinol.  I am ecstatic at the progress Keep me updated but very impressed Lets say 5-6 weeks.

## 2014-09-15 NOTE — Assessment & Plan Note (Signed)
Patient does have low back pain. We discussed icing regimen and home exercises. We discussed that she is doing relatively well. I do think that patient losing weight will continue to improve this as well. Patient will continue the same treatment options and place a patient instructions for further information. I do think the high oxalate level and the high uric acid levels could be contributing and encourage her to continue the allopurinol. Patient come back and see me again in 4 weeks for further evaluation.

## 2014-09-17 ENCOUNTER — Encounter: Payer: Self-pay | Admitting: Family Medicine

## 2014-09-22 DIAGNOSIS — R102 Pelvic and perineal pain: Secondary | ICD-10-CM | POA: Diagnosis not present

## 2014-09-22 DIAGNOSIS — R278 Other lack of coordination: Secondary | ICD-10-CM | POA: Diagnosis not present

## 2014-09-22 DIAGNOSIS — M6281 Muscle weakness (generalized): Secondary | ICD-10-CM | POA: Diagnosis not present

## 2014-09-22 DIAGNOSIS — R32 Unspecified urinary incontinence: Secondary | ICD-10-CM | POA: Diagnosis not present

## 2014-09-28 ENCOUNTER — Telehealth: Payer: Self-pay | Admitting: Obstetrics and Gynecology

## 2014-09-28 NOTE — Telephone Encounter (Signed)
Spoke with patient. Patient states that she has a history of ongoing right lower abdominal pain. Patient had a PUS on 02/19/2014 with our office. Results seen below. Patient was last seen with our office on 06/24/2014 for aex with Dr.Silva. Patient states that she started PT and is noticing increased right lower abdominal pain on days she has PT. "PT makes the pain peak and then it lasts for 2-3 days and then dies down. From there it stays pretty mild but constant which is my normal." Patient is asking if she should be seen for follow up PUS earlier due to discomfort she has been experiencing. Advised I will speak with Dr.Silva and return call with further recommendations. Patient is agreeable.

## 2014-09-28 NOTE — Telephone Encounter (Signed)
Patient says she was told to return Feb. 2017 for an ultrasound. Patient has been experiencing abdominal pain during PT. Patient is wondering if she needs to have this ultrasound sooner. Last seen 06/24/14.

## 2014-09-28 NOTE — Telephone Encounter (Signed)
Spoke with patient. Advised of message as seen below from [place]. Patient is agreeable. PUS scheduled for 9/15 at 10 am with 10:30 am consult with Dr.Silva. Arrival time 9:45 am. Patient is agreeable to date and time. Order previously placed by Dr.Silva. Will need precert.  Cc: Theresia Lo  Routing to provider for final review. Patient agreeable to disposition. Will close encounter.

## 2014-09-28 NOTE — Telephone Encounter (Signed)
OK for pelvic ultrasound due to RLQ pain.  Please send to precert and schedule.  Thank you.

## 2014-10-01 ENCOUNTER — Ambulatory Visit (INDEPENDENT_AMBULATORY_CARE_PROVIDER_SITE_OTHER): Payer: Medicare Other

## 2014-10-01 ENCOUNTER — Ambulatory Visit (INDEPENDENT_AMBULATORY_CARE_PROVIDER_SITE_OTHER): Payer: Medicare Other | Admitting: Obstetrics and Gynecology

## 2014-10-01 ENCOUNTER — Encounter: Payer: Self-pay | Admitting: Obstetrics and Gynecology

## 2014-10-01 VITALS — BP 130/76 | HR 60 | Ht 61.0 in | Wt 196.0 lb

## 2014-10-01 DIAGNOSIS — R9389 Abnormal findings on diagnostic imaging of other specified body structures: Secondary | ICD-10-CM

## 2014-10-01 DIAGNOSIS — D259 Leiomyoma of uterus, unspecified: Secondary | ICD-10-CM | POA: Diagnosis not present

## 2014-10-01 DIAGNOSIS — Z8041 Family history of malignant neoplasm of ovary: Secondary | ICD-10-CM | POA: Diagnosis not present

## 2014-10-01 DIAGNOSIS — G8929 Other chronic pain: Secondary | ICD-10-CM

## 2014-10-01 DIAGNOSIS — R1031 Right lower quadrant pain: Secondary | ICD-10-CM

## 2014-10-01 DIAGNOSIS — R938 Abnormal findings on diagnostic imaging of other specified body structures: Secondary | ICD-10-CM

## 2014-10-01 NOTE — Progress Notes (Signed)
   Subjective  64 y.o. G52P2002  Caucasian female here for pelvic ultrasound for RLQ pain and FH of ovarian cancer.   Visit prompted following PT session at [facility] for mixed incontinence.  After having PT to psoas muscle area, developed pain in RLQ.  Does state she has a life long history of right sided pain.  Status post laparoscopy for pain around age 54, and no pathology was identified.   States one episode of ? Bleeding with voiding after having a PT session at [facility]. No other bleeding.   History of uterine fibroids.   Had pelvic ultrasound in May 2014.  Uterus with 3 fibroids - 1.18 cm, 1.3 cm, 2.77 cm.   EMS 3.04 mm. Ovaries - no masses. No flee fluid.   Pelvic ultrasound in office on 02/19/14 for screening for FH ovarian cancer. Uterus - 2.8 cm fundal fibroid, 2.3 cm cervical fibroid. EMS - 5.61 mm on transvaginal scan.  Ovaries normal.  No free fluid.  (Attempted EMB abandoned due to cervical fibroid and cervical stenosis.)  Sister survived ovarian cancer.  No other family member with ovarian cancer.  No FH of breast cancer.   Objective  Pelvic ultrasound images and report reviewed with patient from today's study. Uterus - 4 fibroids - 2.51 cm, 1.02 cm, 1.85 cm, 0.61 cm. EMS - 3.81 mm with 6 mm sonolucent area encroaching on endometrium, ? Fibroid. Ovaries - normal Free fluid -  No free fluid.      Assessment  Uterine fibroids, largest is a cervical fibroid.  Overall the volume appears stable of the fibroids, but the number has varied over the last 2.5 years.  Questionable endometrial thickening.  Hx of cervical stenosis.  Chronic RLQ pain.  Uncertain etiology.  FH of ovarian cancer.  No sign of cancer today.  Mixed incontinence.   Plan  Discussion of uterine fibroids.   Will do observation with follow up ultrasound in 3 months to recheck endometrial stripe.  If has increase in endometrium or develops postmenopausal bleeding, attempt  hysteroscopy with dilation and curettage with possible ulrasound guidance. Surgery discussed with patient along with the limitation of her cervical fibroid and cervical stenosis.   __25_____ minutes face to face time of which over 50% was spent in counseling.   After visit summary to patient.

## 2014-10-08 DIAGNOSIS — E7210 Disorders of sulfur-bearing amino-acid metabolism, unspecified: Secondary | ICD-10-CM | POA: Diagnosis not present

## 2014-10-08 DIAGNOSIS — E039 Hypothyroidism, unspecified: Secondary | ICD-10-CM | POA: Diagnosis not present

## 2014-10-08 DIAGNOSIS — E559 Vitamin D deficiency, unspecified: Secondary | ICD-10-CM | POA: Diagnosis not present

## 2014-10-08 DIAGNOSIS — E639 Nutritional deficiency, unspecified: Secondary | ICD-10-CM | POA: Diagnosis not present

## 2014-10-08 DIAGNOSIS — R799 Abnormal finding of blood chemistry, unspecified: Secondary | ICD-10-CM | POA: Diagnosis not present

## 2014-10-08 DIAGNOSIS — N200 Calculus of kidney: Secondary | ICD-10-CM | POA: Diagnosis not present

## 2014-10-08 DIAGNOSIS — E279 Disorder of adrenal gland, unspecified: Secondary | ICD-10-CM | POA: Diagnosis not present

## 2014-10-08 DIAGNOSIS — R5383 Other fatigue: Secondary | ICD-10-CM | POA: Diagnosis not present

## 2014-10-15 DIAGNOSIS — G4733 Obstructive sleep apnea (adult) (pediatric): Secondary | ICD-10-CM | POA: Diagnosis not present

## 2014-10-27 ENCOUNTER — Ambulatory Visit: Payer: Medicare Other | Admitting: Family Medicine

## 2014-11-02 DIAGNOSIS — R32 Unspecified urinary incontinence: Secondary | ICD-10-CM | POA: Diagnosis not present

## 2014-11-02 DIAGNOSIS — M6281 Muscle weakness (generalized): Secondary | ICD-10-CM | POA: Diagnosis not present

## 2014-11-02 DIAGNOSIS — R102 Pelvic and perineal pain: Secondary | ICD-10-CM | POA: Diagnosis not present

## 2014-11-02 DIAGNOSIS — R278 Other lack of coordination: Secondary | ICD-10-CM | POA: Diagnosis not present

## 2014-11-04 ENCOUNTER — Ambulatory Visit (INDEPENDENT_AMBULATORY_CARE_PROVIDER_SITE_OTHER): Payer: Medicare Other | Admitting: Family Medicine

## 2014-11-04 ENCOUNTER — Encounter: Payer: Self-pay | Admitting: Family Medicine

## 2014-11-04 VITALS — BP 128/82 | HR 84 | Ht 61.0 in | Wt 193.0 lb

## 2014-11-04 DIAGNOSIS — M9901 Segmental and somatic dysfunction of cervical region: Secondary | ICD-10-CM

## 2014-11-04 DIAGNOSIS — R7989 Other specified abnormal findings of blood chemistry: Secondary | ICD-10-CM | POA: Diagnosis not present

## 2014-11-04 DIAGNOSIS — M9908 Segmental and somatic dysfunction of rib cage: Secondary | ICD-10-CM | POA: Diagnosis not present

## 2014-11-04 DIAGNOSIS — M9903 Segmental and somatic dysfunction of lumbar region: Secondary | ICD-10-CM

## 2014-11-04 DIAGNOSIS — G8929 Other chronic pain: Secondary | ICD-10-CM

## 2014-11-04 DIAGNOSIS — M999 Biomechanical lesion, unspecified: Secondary | ICD-10-CM

## 2014-11-04 MED ORDER — ALBENDAZOLE 200 MG PO TABS: 400.0000 mg | ORAL_TABLET | Freq: Two times a day (BID) | ORAL | Status: DC

## 2014-11-04 NOTE — Assessment & Plan Note (Signed)
Decision today to treat with OMT was based on Physical Exam  After verbal consent patient was treated with , ME, soft tissue techniques in cervical thoracic, lumbar and sacral areas  Patient tolerated the procedure well with improvement in symptoms  Patient given exercises, stretches and lifestyle modifications  See medications in patient instructions if given  Patient will follow up in 4  weeks

## 2014-11-04 NOTE — Assessment & Plan Note (Signed)
Patient does have high oxalate levels in the bloodstream and low urine. 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 We will discuss with nephrology to see if there is anything that could be beneficial. Patient has made significant changes in her diet without any significant improvement. Decided at this point and patient intestinal health could be compromised with a possible parasitic infection in patient will be treated with albendazole. We discussed potential side effects. In addition to this we will discuss different vitamin supplementations a could be beneficial. I do think that this is likely causing most of her chronic pain. Patient has been losing weight but I do not think that her quality of life has improved. Patient will continue to see the integrated medicine physician but I do not feel that calcium supplementation or magnesium is necessary at this time. Patient will come back and see me again in 4 weeks for further evaluation.  Spent  25 minutes with patient face-to-face and had greater than 50% of counseling including as described above in assessment and plan.

## 2014-11-04 NOTE — Progress Notes (Signed)
CC: Follow  Up for chronic pain  Patient is also here for a manipulation. Patient does have chronic pain type syndrome. Patient has responded well to osteopathic manipulation previously. Patient continues to natural supplementations as well as the vitamin D. Patient overall has been doing fairly well in responding to osteopathic manipulation.  Right knee with known severe osteophytic changes keeps giving her trouble.patient denies though that it is giving her any significant trouble more than usual.patient has lost weight and has been doing relatively well. Got new shoes which is helped some of her low back pain. Patient has seen another provider being treated for yeast overgrowth and has noticed improvement. Patient has had high oxalate and as well as uric acid and has been treated with allopurinol as well as patient is been taking calcium citrate as well as magnesium. Patient will seem to be doing somewhat better but unfortunately didn't have an exacerbation. When I did test her labs her oxalate went from 13-51. Patient is having pain in all joints at this time. States that the back pain seems to be worse. Knee pain is worse as well. Having difficulty doing daily activities. Patient is becoming extremely frustrating. Continues with integrated medicine. She has been treated for more of a yeast overgrowth. Patient states was having flushing while she was taking the [place] or antifungal medications. Patient stopped this and feels that some things are getting worse but no longer having the hot flash feeling. Wt Readings from Last 3 Encounters:  10/01/14 196 lb (88.905 kg)  09/15/14 196 lb (88.905 kg)  08/18/14 200 lb (90.719 kg)        Past Medical History  Diagnosis Date  . Allergic rhinitis, cause unspecified   . Chronic pain   . GERD (gastroesophageal reflux disease)   . HTN (hypertension)   . Sleep apnea   . Abnormal uterine bleeding   . Dyspareunia   . Fibroid   . Urinary incontinence   .  Kidney stones   . Bursitis of left shoulder    Past Surgical History  Procedure Laterality Date  . Umbilical hernia repair  2010  . Carpal tunnel release  1998  . Dental implants  2010  . Esophageal dilation  2010  . Hammer toe surgery      rt  . Septoplasty    . Rotator cuff repair Right 09/2011    Dr. Tamera Punt  . Pelvic laparoscopy  1990  .  dilatation and curettage  2000  . Rotator cuff repair Left 11/11/13    Dr. Tamera Punt    Social History  Substance Use Topics  . Smoking status: Never Smoker   . Smokeless tobacco: Never Used  . Alcohol Use: No   Allergies  Allergen Reactions  . Cefixime Swelling  . Celebrex [Celecoxib] Swelling  . Levofloxacin Other (See Comments)    Muscle and joint pain  . Other Other (See Comments)    Other Reaction: painful joints TEQUIN. TEQUIN - JOINT AND MUSCLE PAIN  . Penicillins Hives  . Robaxin [Methocarbamol]   . Tomato Hives  . Adhesive [Tape] Rash    Sensitive to EKG leads  . Tetracyclines & Related Rash   Family History  Problem Relation Age of Onset  . Rheumatologic disease Mother   . Hypertension Mother   . Hypertension Father   . Rheumatologic disease Sister   . Arthritis Sister   . Ovarian cancer Sister 5     Last menstrual period 01/16/2002. General: No apparent distress alert and  oriented x3 mood and affect normal Respiratory: Patient's recent full sentences and does not appear short of breath  Skin: Warm dry intact with no signs of infection or rash  Neuro: Cranial nerves II through XII are intact, neurovascularly intact in all extremities with 2+ DTRs and 2+ pulses.   Foot exam shows the patient doesn't pes planus bilaterally with oversupination of the hindfoot and mild metatarsal abduction bilaterally. Patient is severely tender to palpation over the midfoot on the left foot. Back Exam:  Inspection: Unremarkable  Motion: Flexion 45 deg, Extension 45 deg, Side Bending to 45 deg bilaterally,  Rotation to 45 deg  bilaterally  SLR laying: Negative  XSLR laying: Negative  Palpable tenderness: or discomfort of multiple joints. FABER: negative. Sensory change: Gross sensation intact to all lumbar and sacral dermatomes.  Reflexes: 2+ at both patellar tendons, 2+ at achilles tendons, Babinski's downgoing.  Strength at foot  Plantar-flexion: 5/5 Dorsi-flexion: 5/5 Eversion: 5/5 Inversion: 5/5  Leg strength  Quad: 5/5 Hamstring: 5/5 Hip flexor: 5/5 Hip abductors: 4/5  Gait unremarkable.   OMT findings  Cervical C2 flexed rotated and side bent left C5 extended rotated and side bent left  thoracic  T1 E RS right  T6 extended rotated and side bent  right  Lumbar L2 flexed rotated and side bent right  Sacrum Left on left Right anterior pelvis

## 2014-11-04 NOTE — Patient Instructions (Signed)
Potassium citrate is a possibility I will check with renal docs and see if I can find someone who deals with it on a more regular basis.  New exercises for IT band and go at it on the foam roller.  See me again in 4 weeks. Call me Friday morning if you have not heard from me.  30 minute appointment!!!!

## 2014-11-04 NOTE — Progress Notes (Signed)
Pre visit review using our clinic review tool, if applicable. No additional management support is needed unless otherwise documented below in the visit note. 

## 2014-11-06 ENCOUNTER — Encounter: Payer: Self-pay | Admitting: Family Medicine

## 2014-11-09 DIAGNOSIS — R5383 Other fatigue: Secondary | ICD-10-CM | POA: Diagnosis not present

## 2014-11-09 DIAGNOSIS — R0600 Dyspnea, unspecified: Secondary | ICD-10-CM | POA: Diagnosis not present

## 2014-11-09 DIAGNOSIS — G4733 Obstructive sleep apnea (adult) (pediatric): Secondary | ICD-10-CM | POA: Diagnosis not present

## 2014-11-17 DIAGNOSIS — Z1159 Encounter for screening for other viral diseases: Secondary | ICD-10-CM | POA: Diagnosis not present

## 2014-11-17 DIAGNOSIS — Z23 Encounter for immunization: Secondary | ICD-10-CM | POA: Diagnosis not present

## 2014-11-17 DIAGNOSIS — M797 Fibromyalgia: Secondary | ICD-10-CM | POA: Diagnosis not present

## 2014-11-17 LAB — HM HEPATITIS C SCREENING LAB: HM Hepatitis Screen: NEGATIVE

## 2014-11-19 DIAGNOSIS — T50Z95A Adverse effect of other vaccines and biological substances, initial encounter: Secondary | ICD-10-CM | POA: Diagnosis not present

## 2014-11-30 ENCOUNTER — Telehealth: Payer: Self-pay | Admitting: Obstetrics and Gynecology

## 2014-11-30 NOTE — Telephone Encounter (Signed)
Reviewed benefit for pelvic ultrasound with patient. Patient understands and agreeable. Patient scheduled for 12/31/14 with Dr Quincy Simmonds. Patient agreeable to arrival date/time. Patient agreeable to 72 hour cancellation policy with [id number] fee. Patient notes her daughter is due to deliver a baby near that time, but is aware of policy and will update with any changes. No further questions. Ok to close.

## 2014-12-01 ENCOUNTER — Encounter: Payer: Self-pay | Admitting: Family Medicine

## 2014-12-03 ENCOUNTER — Encounter: Payer: Self-pay | Admitting: Family Medicine

## 2014-12-03 ENCOUNTER — Ambulatory Visit (INDEPENDENT_AMBULATORY_CARE_PROVIDER_SITE_OTHER): Payer: Medicare Other | Admitting: Family Medicine

## 2014-12-03 VITALS — BP 138/74 | HR 91 | Ht 61.0 in | Wt 193.0 lb

## 2014-12-03 DIAGNOSIS — G8929 Other chronic pain: Secondary | ICD-10-CM

## 2014-12-03 DIAGNOSIS — M6281 Muscle weakness (generalized): Secondary | ICD-10-CM | POA: Diagnosis not present

## 2014-12-03 DIAGNOSIS — M999 Biomechanical lesion, unspecified: Secondary | ICD-10-CM

## 2014-12-03 DIAGNOSIS — R278 Other lack of coordination: Secondary | ICD-10-CM | POA: Diagnosis not present

## 2014-12-03 DIAGNOSIS — M9903 Segmental and somatic dysfunction of lumbar region: Secondary | ICD-10-CM | POA: Diagnosis not present

## 2014-12-03 DIAGNOSIS — M9901 Segmental and somatic dysfunction of cervical region: Secondary | ICD-10-CM | POA: Diagnosis not present

## 2014-12-03 DIAGNOSIS — M9908 Segmental and somatic dysfunction of rib cage: Secondary | ICD-10-CM

## 2014-12-03 DIAGNOSIS — R32 Unspecified urinary incontinence: Secondary | ICD-10-CM | POA: Diagnosis not present

## 2014-12-03 DIAGNOSIS — R102 Pelvic and perineal pain: Secondary | ICD-10-CM | POA: Diagnosis not present

## 2014-12-03 NOTE — Progress Notes (Signed)
CC: Follow  Up for chronic pain f/u  Patient is also here for a manipulation. Patient does have chronic pain syndrome. Patient has responded well to osteopathic manipulation previously. Patient continues to natural supplementations as well as the vitamin D. Patient overall has been doing fairly well in responding to osteopathic manipulation. Patient recently was given a flu shot and had an exacerbation for the first 24 hours. Patient states though after that she did have decreasing pain in her joints for approximately 48 hours. Been slowly the pain management Sr. coming back again. Some of this was associated with mouth blisters as well as hand and foot swelling. Hasn't had positive sick contacts. Patient is still having chronic pain overall. Continues to see integrated medicine. Taking allopurinol for the elevated uric acid. No new symptoms    Continues with integrated medicine.  Wt Readings from Last 3 Encounters:  12/03/14 193 lb (87.544 kg)  11/04/14 193 lb (87.544 kg)  10/01/14 196 lb (88.905 kg)        Past Medical History  Diagnosis Date  . Allergic rhinitis, cause unspecified   . Chronic pain   . GERD (gastroesophageal reflux disease)   . HTN (hypertension)   . Sleep apnea   . Abnormal uterine bleeding   . Dyspareunia   . Fibroid   . Urinary incontinence   . Kidney stones   . Bursitis of left shoulder    Past Surgical History  Procedure Laterality Date  . Umbilical hernia repair  2010  . Carpal tunnel release  1998  . Dental implants  2010  . Esophageal dilation  2010  . Hammer toe surgery      rt  . Septoplasty    . Rotator cuff repair Right 09/2011    Dr. Tamera Punt  . Pelvic laparoscopy  1990  .  dilatation and curettage  2000  . Rotator cuff repair Left 11/11/13    Dr. Tamera Punt    Social History  Substance Use Topics  . Smoking status: Never Smoker   . Smokeless tobacco: Never Used  . Alcohol Use: No   Allergies  Allergen Reactions  . Cefixime Swelling   . Celebrex [Celecoxib] Swelling  . Levofloxacin Other (See Comments)    Muscle and joint pain  . Other Other (See Comments)    Other Reaction: painful joints TEQUIN. TEQUIN - JOINT AND MUSCLE PAIN  . Penicillins Hives  . Robaxin [Methocarbamol]   . Tomato Hives  . Adhesive [Tape] Rash    Sensitive to EKG leads  . Tetracyclines & Related Rash   Family History  Problem Relation Age of Onset  . Rheumatologic disease Mother   . Hypertension Mother   . Hypertension Father   . Rheumatologic disease Sister   . Arthritis Sister   . Ovarian cancer Sister 49     Blood pressure 138/74, pulse 91, height 5\' 1"  (1.549 m), weight 193 lb (87.544 kg), last menstrual period 01/16/2002, SpO2 96 %. General: No apparent distress alert and oriented x3 mood and affect normal Respiratory: Patient's recent full sentences and does not appear short of breath  Skin: Warm dry intact with no signs of infection or rash  Neuro: Cranial nerves II through XII are intact, neurovascularly intact in all extremities with 2+ DTRs and 2+ pulses.   Foot exam shows the patient doesn't pes planus bilaterally with oversupination of the hindfoot and mild metatarsal abduction bilaterally. Patient is severely tender to palpation over the midfoot on the left foot. Back Exam:  Inspection: Unremarkable  Motion: Flexion 45 deg, Extension 45 deg, Side Bending to 45 deg bilaterally,  Rotation to 45 deg bilaterally  SLR laying: Negative  XSLR laying: Negative  Palpable tenderness:  discomfort of multiple joints still present. Possibly less swelling of the DIPs from previous exam. FABER: negative. Sensory change: Gross sensation intact to all lumbar and sacral dermatomes.  Reflexes: 2+ at both patellar tendons, 2+ at achilles tendons, Babinski's downgoing.  Strength at foot  Plantar-flexion: 5/5 Dorsi-flexion: 5/5 Eversion: 5/5 Inversion: 5/5  Leg strength  Quad: 5/5 Hamstring: 5/5 Hip flexor: 5/5 Hip abductors: 4/5  Gait  unremarkable.   OMT findings  Cervical C2 flexed rotated and side bent left C5 extended rotated and side bent left  thoracic  T1 E RS right  T6 extended rotated and side bent  right  Lumbar L2 flexed rotated and side bent right  Sacrum Left on left Neutral

## 2014-12-03 NOTE — Assessment & Plan Note (Signed)
Continues to be very difficult to assess. Patient continued to make some strides and was losing weight. Seems to have stabilized at this time. I do think the patient's exacerbation was likely secondary to a coxsackie virus. Patient's seems to be recovering well. We discussed though with patient actually not having an appetite for 24 hours after the injections possibly there is a food allergy that is still not present. We may want to consider immunology workup at some point. 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 Patient will continue treatment in dietary changes. Patient does respond well to osteopathic manipulation. I like patient come back again in 3-4 weeks for further evaluation and treatment. Always be a difficult case with a lot of moving parts.  Spent  25 minutes with patient face-to-face and had greater than 50% of counseling including as described above in assessment and plan.

## 2014-12-03 NOTE — Assessment & Plan Note (Signed)
Decision today to treat with OMT was based on Physical Exam  After verbal consent patient was treated with , ME, soft tissue techniques in cervical thoracic, lumbar and sacral areas  Patient tolerated the procedure well with improvement in symptoms  Patient given exercises, stretches and lifestyle modifications  See medications in patient instructions if given  Patient will follow up in 4  weeks                                 

## 2014-12-03 NOTE — Progress Notes (Signed)
Pre visit review using our clinic review tool, if applicable. No additional management support is needed unless otherwise documented below in the visit note. 

## 2014-12-03 NOTE — Patient Instructions (Addendum)
Good to see you Sorry for the coxsackie virus Keep doing what you are doing.  Take the abendazole After that if not better than try a 24 hour fast and see if it helps joint pain.  Next visit let get labs for kidneys liver and uric acid again.

## 2014-12-18 DIAGNOSIS — B9689 Other specified bacterial agents as the cause of diseases classified elsewhere: Secondary | ICD-10-CM | POA: Diagnosis not present

## 2014-12-18 DIAGNOSIS — J019 Acute sinusitis, unspecified: Secondary | ICD-10-CM | POA: Diagnosis not present

## 2014-12-23 ENCOUNTER — Encounter: Payer: Self-pay | Admitting: Family Medicine

## 2014-12-24 ENCOUNTER — Ambulatory Visit (INDEPENDENT_AMBULATORY_CARE_PROVIDER_SITE_OTHER): Payer: Medicare Other | Admitting: Family Medicine

## 2014-12-24 ENCOUNTER — Other Ambulatory Visit (INDEPENDENT_AMBULATORY_CARE_PROVIDER_SITE_OTHER): Payer: Medicare Other

## 2014-12-24 ENCOUNTER — Encounter: Payer: Self-pay | Admitting: Family Medicine

## 2014-12-24 VITALS — BP 136/84 | HR 99 | Ht 61.0 in | Wt 199.0 lb

## 2014-12-24 DIAGNOSIS — J321 Chronic frontal sinusitis: Secondary | ICD-10-CM | POA: Diagnosis not present

## 2014-12-24 DIAGNOSIS — R7989 Other specified abnormal findings of blood chemistry: Secondary | ICD-10-CM | POA: Diagnosis not present

## 2014-12-24 DIAGNOSIS — M2550 Pain in unspecified joint: Secondary | ICD-10-CM | POA: Diagnosis not present

## 2014-12-24 DIAGNOSIS — M9908 Segmental and somatic dysfunction of rib cage: Secondary | ICD-10-CM

## 2014-12-24 DIAGNOSIS — M9901 Segmental and somatic dysfunction of cervical region: Secondary | ICD-10-CM | POA: Diagnosis not present

## 2014-12-24 DIAGNOSIS — M9903 Segmental and somatic dysfunction of lumbar region: Secondary | ICD-10-CM

## 2014-12-24 DIAGNOSIS — M999 Biomechanical lesion, unspecified: Secondary | ICD-10-CM

## 2014-12-24 LAB — COMPREHENSIVE METABOLIC PANEL
ALT: 17 U/L (ref 0–35)
AST: 18 U/L (ref 0–37)
Albumin: 4 g/dL (ref 3.5–5.2)
Alkaline Phosphatase: 60 U/L (ref 39–117)
BILIRUBIN TOTAL: 0.2 mg/dL (ref 0.2–1.2)
BUN: 16 mg/dL (ref 6–23)
CALCIUM: 9.5 mg/dL (ref 8.4–10.5)
CO2: 29 meq/L (ref 19–32)
CREATININE: 0.76 mg/dL (ref 0.40–1.20)
Chloride: 104 mEq/L (ref 96–112)
GFR: 80.88 mL/min (ref >60.00)
GLUCOSE: 98 mg/dL (ref 70–99)
Potassium: 4.5 mEq/L (ref 3.5–5.1)
SODIUM: 142 meq/L (ref 135–145)
Total Protein: 7.1 g/dL (ref 6.0–8.3)

## 2014-12-24 LAB — CBC WITH DIFFERENTIAL/PLATELET
BASOS PCT: 0.2 % (ref 0.0–3.0)
Basophils Absolute: 0 10*3/uL (ref 0.0–0.1)
EOS ABS: 0.3 10*3/uL (ref 0.0–0.7)
EOS PCT: 3.9 % (ref 0.0–5.0)
HCT: 41.4 % (ref 36.0–46.0)
Hemoglobin: 13.7 g/dL (ref 12.0–15.0)
LYMPHS ABS: 2.2 10*3/uL (ref 0.7–4.0)
Lymphocytes Relative: 29.6 % (ref 12.0–46.0)
MCHC: 33.1 g/dL (ref 30.0–36.0)
MCV: 92.6 fl (ref 78.0–100.0)
MONO ABS: 0.5 10*3/uL (ref 0.1–1.0)
Monocytes Relative: 7 % (ref 3.0–12.0)
NEUTROS ABS: 4.3 10*3/uL (ref 1.4–7.7)
NEUTROS PCT: 59.3 % (ref 43.0–77.0)
PLATELETS: 243 10*3/uL (ref 150.0–400.0)
RBC: 4.48 Mil/uL (ref 3.87–5.11)
RDW: 15.4 % (ref 11.5–15.5)
WBC: 7.3 10*3/uL (ref 4.0–10.5)

## 2014-12-24 LAB — URIC ACID: Uric Acid, Serum: 3.9 mg/dL (ref 2.4–7.0)

## 2014-12-24 MED ORDER — DOXYCYCLINE HYCLATE 100 MG PO TABS: 100.0000 mg | ORAL_TABLET | Freq: Two times a day (BID) | ORAL | Status: DC

## 2014-12-24 NOTE — Patient Instructions (Signed)
Good to see you Doxycycline 100mg  twice daily We will get labs today and I will release them Lets get this to die down and then discuss the next steps.  Thinking we need to change something up such as meds, referral or think about moving to [place] ;) Happy holidays!

## 2014-12-24 NOTE — Progress Notes (Signed)
CC: Follow  Up for chronic pain f/u  Patient is also here for a manipulation. Patient does have chronic pain syndrome. Patient has sensibility workup for this previously. 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 Did respond fairly well to the medications and diet changes. Patient was seen integrated medicine physician and patient had loss or weight. Patient is starting to regain weight on a more regular basis. Has had a recent illnesses. Seems to exacerbate it all the arthralgia she has. Family history is still positive for rheumatoid arthritis but patient seems to be more of a seronegative autoimmune reaction.    Continues with integrated medicine.  Wt Readings from Last 3 Encounters:  12/03/14 193 lb (87.544 kg)  11/04/14 193 lb (87.544 kg)  10/01/14 196 lb (88.905 kg)        Past Medical History  Diagnosis Date  . Allergic rhinitis, cause unspecified   . Chronic pain   . GERD (gastroesophageal reflux disease)   . HTN (hypertension)   . Sleep apnea   . Abnormal uterine bleeding   . Dyspareunia   . Fibroid   . Urinary incontinence   . Kidney stones   . Bursitis of left shoulder    Past Surgical History  Procedure Laterality Date  . Umbilical hernia repair  2010  . Carpal tunnel release  1998  . Dental implants  2010  . Esophageal dilation  2010  . Hammer toe surgery      rt  . Septoplasty    . Rotator cuff repair Right 09/2011    Dr. Tamera Punt  . Pelvic laparoscopy  1990  .  dilatation and curettage  2000  . Rotator cuff repair Left 11/11/13    Dr. Tamera Punt    Social History  Substance Use Topics  . Smoking status: Never Smoker   . Smokeless tobacco: Never Used  . Alcohol Use: No   Allergies  Allergen Reactions  . Cefixime Swelling  . Celebrex [Celecoxib] Swelling  . Levofloxacin Other (See Comments)    Muscle and joint pain  . Other Other (See Comments)    Other Reaction: painful joints TEQUIN. TEQUIN - JOINT  AND MUSCLE PAIN  . Penicillins Hives  . Robaxin [Methocarbamol]   . Tomato Hives  . Adhesive [Tape] Rash    Sensitive to EKG leads  . Tetracyclines & Related Rash   Family History  Problem Relation Age of Onset  . Rheumatologic disease Mother   . Hypertension Mother   . Hypertension Father   . Rheumatologic disease Sister   . Arthritis Sister   . Ovarian cancer Sister 42     Last menstrual period 01/16/2002. General: No apparent distress alert and oriented x3 mood and affect normal appears ill.  Respiratory: Patient's recent full sentences and does not appear short of breath significant congestion and cough noted + pain to percussion over bilateral frontal sinuses.  Skin: Warm dry intact with no signs of infection or rash  Neuro: Cranial nerves II through XII are intact, neurovascularly intact in all extremities with 2+ DTRs and 2+ pulses.   Back Exam:  Inspection: Unremarkable  Motion: Flexion 35 deg, Extension 15 deg, Side Bending to 15 deg bilaterally,  Rotation to 30 deg bilaterally significant tightness from previous exam. SLR laying: Negative  XSLR laying: Negative  Palpable tenderness:  discomfort of multiple joints still present. Possibly less swelling of the DIPs from previous exam. FABER: negative. Sensory change: Gross sensation intact to all  lumbar and sacral dermatomes.  Reflexes: 2+ at both patellar tendons, 2+ at achilles tendons, Babinski's downgoing.  Strength at foot  Plantar-flexion: 5/5 Dorsi-flexion: 5/5 Eversion: 5/5 Inversion: 5/5  Leg strength  Quad: 5/5 Hamstring: 5/5 Hip flexor: 5/5 Hip abductors: 4/5  Gait unremarkable.   OMT findings  Cervical C2 flexed rotated and side bent left C4 flexed roatetaed and side bent left  C5 extended rotated and side bent left  thoracic  T1 E RS right  T6 extended rotated and side bent  right  Lumbar L2 flexed rotated and side bent right L4 F RS right  Sacrum Left on left Neutral

## 2014-12-24 NOTE — Progress Notes (Signed)
Pre visit review using our clinic review tool, if applicable. No additional management support is needed unless otherwise documented below in the visit note. 

## 2014-12-25 ENCOUNTER — Encounter: Payer: Self-pay | Admitting: Family Medicine

## 2014-12-25 DIAGNOSIS — J329 Chronic sinusitis, unspecified: Secondary | ICD-10-CM | POA: Insufficient documentation

## 2014-12-25 LAB — CYCLIC CITRUL PEPTIDE ANTIBODY, IGG

## 2014-12-25 LAB — ANA: ANA: NEGATIVE

## 2014-12-25 NOTE — Assessment & Plan Note (Signed)
Continues to be difficult. Ice is your friend. Has had work up but likely a seronegative auto immune.  Will get other labs such as HLA B27 that could be contributing, severe oa of multiple joints.  Discussed the importance of the weight loss and diet changes.  Has had difficulty with this before.  Plan see more of patient instructions.  Spent  25 minutes with patient face-to-face and had greater than 50% of counseling including as described above in assessment and plan. 

## 2014-12-25 NOTE — Assessment & Plan Note (Signed)
Continue to monitor diet and to take allopurinol.  Discuss possible need for diuretic and othe rmedicines if needed.

## 2014-12-25 NOTE — Assessment & Plan Note (Signed)
Decision today to treat with OMT was based on Physical Exam  After verbal consent patient was treated with , ME, soft tissue techniques in cervical thoracic, lumbar and sacral areas  Patient tolerated the procedure well with improvement in symptoms  Patient given exercises, stretches and lifestyle modifications  See medications in patient instructions if given  Patient will follow up in 4  weeks                                 

## 2014-12-25 NOTE — Assessment & Plan Note (Signed)
Doxy per order

## 2014-12-28 DIAGNOSIS — E7210 Disorders of sulfur-bearing amino-acid metabolism, unspecified: Secondary | ICD-10-CM | POA: Diagnosis not present

## 2014-12-28 DIAGNOSIS — E559 Vitamin D deficiency, unspecified: Secondary | ICD-10-CM | POA: Diagnosis not present

## 2014-12-28 DIAGNOSIS — E039 Hypothyroidism, unspecified: Secondary | ICD-10-CM | POA: Diagnosis not present

## 2014-12-28 DIAGNOSIS — D509 Iron deficiency anemia, unspecified: Secondary | ICD-10-CM | POA: Diagnosis not present

## 2014-12-28 DIAGNOSIS — R799 Abnormal finding of blood chemistry, unspecified: Secondary | ICD-10-CM | POA: Diagnosis not present

## 2014-12-28 DIAGNOSIS — M791 Myalgia: Secondary | ICD-10-CM | POA: Diagnosis not present

## 2014-12-30 LAB — HLA B*5701: HLA-B 5701 W/RFLX HLA-B HIGH: NEGATIVE

## 2014-12-31 ENCOUNTER — Ambulatory Visit: Payer: Medicare Other | Admitting: Family Medicine

## 2014-12-31 ENCOUNTER — Ambulatory Visit (INDEPENDENT_AMBULATORY_CARE_PROVIDER_SITE_OTHER): Payer: Medicare Other | Admitting: Obstetrics and Gynecology

## 2014-12-31 ENCOUNTER — Ambulatory Visit (INDEPENDENT_AMBULATORY_CARE_PROVIDER_SITE_OTHER): Payer: Medicare Other

## 2014-12-31 ENCOUNTER — Encounter: Payer: Self-pay | Admitting: Obstetrics and Gynecology

## 2014-12-31 VITALS — BP 120/70 | HR 68 | Resp 16 | Ht 61.0 in | Wt 201.0 lb

## 2014-12-31 DIAGNOSIS — D259 Leiomyoma of uterus, unspecified: Secondary | ICD-10-CM | POA: Diagnosis not present

## 2014-12-31 DIAGNOSIS — R9389 Abnormal findings on diagnostic imaging of other specified body structures: Secondary | ICD-10-CM

## 2014-12-31 DIAGNOSIS — Z8041 Family history of malignant neoplasm of ovary: Secondary | ICD-10-CM

## 2014-12-31 DIAGNOSIS — R634 Abnormal weight loss: Secondary | ICD-10-CM | POA: Diagnosis not present

## 2014-12-31 DIAGNOSIS — R938 Abnormal findings on diagnostic imaging of other specified body structures: Secondary | ICD-10-CM | POA: Diagnosis not present

## 2014-12-31 NOTE — Progress Notes (Signed)
Subjective  64 y.o. G32P2002 Caucasian female here for pelvic ultrasound for RLQ pain, FH ovarian cancer, and slightly thickened endometrium and inability to do EMB. Patient presents with tape recorder and makes her own recordings of the findings on the ultrasound and comments that I make about her care and recommendations.   History is as follows which has prompted repeat ultrasound examinations:  Visit prompted following PT session at [facility] for mixed incontinence.  After having PT to psoas muscle area, developed pain in RLQ.  Does state she has a life long history of right sided pain.  Status post laparoscopy for pain around age 39, and no pathology was identified.  Pain improved with PT at [facility].   States one episode of ? Bleeding with voiding after having a PT session at [facility]. No other bleeding.  Does have history of uterine fibroids.   Had pelvic ultrasound in May 2014.  Uterus with 3 fibroids - 1.18 cm, 1.3 cm, 2.77 cm.  EMS 3.04 mm. Ovaries - no masses. No flee fluid.   Pelvic ultrasound in office on 02/19/14 for screening for FH ovarian cancer. Uterus - 2.8 cm fundal fibroid, 2.3 cm cervical fibroid. EMS - 5.61 mm on transvaginal scan.  Ovaries normal.  No free fluid.  (Attempted EMB abandoned due to cervical fibroid and cervical stenosis.)  Pelvic ultrasound 10/01/14 to recheck the endometrium. Uterus - 4 fibroids - 2.51 cm, 1.02 cm, 1.85 cm, 0.61 cm. EMS - 3.81 mm with 6 mm sonolucent area encroaching on endometrium, ? Fibroid. Ovaries - normal Free fluid - No free fluid.   Sister survived ovarian cancer.  No other family member with ovarian cancer.  No FH of breast cancer.   ROS - intentionally lost 20 - 25 pounds over the last 11 months.   Objective  Pelvic ultrasound images and report reviewed with patient.  Uterus - Fibroids as previously seen - 2.51 cm, 0.92 cm, 0.66 cm, 0.76 cm. EMS - 3.43 mm. Ovaries - normal.    Free fluid - no       Assessment Uterine fibroids, largest is a cervical fibroid. Overall the volume appears stable of the fibroids, but the number has varied over the last 2.5 years.  Normal endometrium.  Hx of cervical stenosis.  Chronic RLQ pain. Uncertain etiology.  FH of ovarian cancer. No sign of cancer today.  Obesity. Successful weight loss.   Plan Discussion of fibroids, endometrial measurements, cervical stenosis.  Plan for annual ultrasound to check ovaries due to family history.  CA125 not indicated.  No pursuit of endometrial sampling needed.  Call for any postmenopausal bleeding or spotting. We discussed that weight loss is a way to reduce risk of endometrial cancer.  Follow up for yearly exams and prn.   _25______ minutes face to face time of which over 50% was spent in counseling.   After visit summary to patient.

## 2015-01-05 DIAGNOSIS — R102 Pelvic and perineal pain: Secondary | ICD-10-CM | POA: Diagnosis not present

## 2015-01-05 DIAGNOSIS — R278 Other lack of coordination: Secondary | ICD-10-CM | POA: Diagnosis not present

## 2015-01-05 DIAGNOSIS — R32 Unspecified urinary incontinence: Secondary | ICD-10-CM | POA: Diagnosis not present

## 2015-01-05 DIAGNOSIS — M6281 Muscle weakness (generalized): Secondary | ICD-10-CM | POA: Diagnosis not present

## 2015-01-17 DIAGNOSIS — S92902A Unspecified fracture of left foot, initial encounter for closed fracture: Secondary | ICD-10-CM

## 2015-01-17 HISTORY — DX: Unspecified fracture of left foot, initial encounter for closed fracture: S92.902A

## 2015-01-21 ENCOUNTER — Encounter: Payer: Self-pay | Admitting: Family Medicine

## 2015-01-21 ENCOUNTER — Ambulatory Visit: Payer: Medicare Other | Admitting: Family Medicine

## 2015-01-21 ENCOUNTER — Ambulatory Visit (INDEPENDENT_AMBULATORY_CARE_PROVIDER_SITE_OTHER): Payer: Medicare Other | Admitting: Family Medicine

## 2015-01-21 ENCOUNTER — Ambulatory Visit (INDEPENDENT_AMBULATORY_CARE_PROVIDER_SITE_OTHER)
Admission: RE | Admit: 2015-01-21 | Discharge: 2015-01-21 | Disposition: A | Discharge status: Home / Self Care | Payer: Medicare Other | Source: Ambulatory Visit | Attending: Family Medicine | Admitting: Family Medicine

## 2015-01-21 VITALS — BP 132/78 | HR 101 | Wt 199.0 lb

## 2015-01-21 DIAGNOSIS — M25572 Pain in left ankle and joints of left foot: Secondary | ICD-10-CM

## 2015-01-21 DIAGNOSIS — M9904 Segmental and somatic dysfunction of sacral region: Secondary | ICD-10-CM

## 2015-01-21 DIAGNOSIS — M999 Biomechanical lesion, unspecified: Secondary | ICD-10-CM

## 2015-01-21 DIAGNOSIS — M9903 Segmental and somatic dysfunction of lumbar region: Secondary | ICD-10-CM

## 2015-01-21 DIAGNOSIS — G8929 Other chronic pain: Secondary | ICD-10-CM | POA: Diagnosis not present

## 2015-01-21 DIAGNOSIS — S92253A Displaced fracture of navicular [scaphoid] of unspecified foot, initial encounter for closed fracture: Secondary | ICD-10-CM | POA: Insufficient documentation

## 2015-01-21 DIAGNOSIS — S92252A Displaced fracture of navicular [scaphoid] of left foot, initial encounter for closed fracture: Secondary | ICD-10-CM | POA: Diagnosis not present

## 2015-01-21 DIAGNOSIS — M9902 Segmental and somatic dysfunction of thoracic region: Secondary | ICD-10-CM

## 2015-01-21 NOTE — Patient Instructions (Addendum)
Good to see you Heat for the ankle Wear the bot Once weekly vitamin D for next 8 weeks.  Consider the methotrexate or plaquenil and read about it. I know you will  See me again in 3 weeks.  Navicular bone and get an xray 30 minute appointment at follow up

## 2015-01-21 NOTE — Progress Notes (Signed)
CC: Follow  Up for chronic pain f/u  Patient is also here for a manipulation. Patient does have chronic pain syndrome. Has had significant workup. Patient did have significant different findings over the course of time. We do believe that the working diagnosis is chronic pain syndrome with a possibility of a seronegative rheumatoid arthritis. Does have a strong family history of rheumatoid arthritis. We have treated her for uric acid as well as oxalate. Patient has made strides and get better from time to time and then seems to worsen. Concern that the medications that does not seem to be helping as much. Overall she is feels like she has done a little bit better over the course since her last visit but unfortunately is now having a new problem. With her back she has responded somewhat to osteopathic manipulation. Continues the different vitamins. Wondering of the dosing as correct.  New problem includes a left ankle pain. Medial. Having difficulty even with ambulation. Does not rib or any injury but was standing a significant more. States that she tried icing which made it worse and heat seem to make it better. Rates the severity pain is 8 out of 10. Was treated for a sinus infection that is completely resolved at this time. States that the doxycycline was helpful.  Continues with integrated medicine.  Wt Readings from Last 3 Encounters:  01/21/15 199 lb (90.266 kg)  12/31/14 201 lb (91.173 kg)  12/24/14 199 lb (90.266 kg)        Past Medical History  Diagnosis Date  . Allergic rhinitis, cause unspecified   . Chronic pain   . GERD (gastroesophageal reflux disease)   . HTN (hypertension)   . Sleep apnea   . Abnormal uterine bleeding   . Dyspareunia   . Fibroid   . Urinary incontinence   . Kidney stones   . Bursitis of left shoulder    Past Surgical History  Procedure Laterality Date  . Umbilical hernia repair  2010  . Carpal tunnel release  1998  . Dental implants  2010  .  Esophageal dilation  2010  . Hammer toe surgery      rt  . Septoplasty    . Rotator cuff repair Right 09/2011    Dr. Tamera Punt  . Pelvic laparoscopy  1990  .  dilatation and curettage  2000  . Rotator cuff repair Left 11/11/13    Dr. Tamera Punt    Social History  Substance Use Topics  . Smoking status: Never Smoker   . Smokeless tobacco: Never Used  . Alcohol Use: No   Allergies  Allergen Reactions  . Cefixime Swelling  . Celebrex [Celecoxib] Swelling  . Levofloxacin Other (See Comments)    Muscle and joint pain  . Other Other (See Comments)    Other Reaction: painful joints TEQUIN. TEQUIN - JOINT AND MUSCLE PAIN  . Penicillins Hives  . Robaxin [Methocarbamol]   . Tomato Hives  . Adhesive [Tape] Rash    Sensitive to EKG leads  . Tetracyclines & Related Rash   Family History  Problem Relation Age of Onset  . Rheumatologic disease Mother   . Hypertension Mother   . Hypertension Father   . Rheumatologic disease Sister   . Arthritis Sister   . Ovarian cancer Sister 28     Blood pressure 132/78, pulse 101, weight 199 lb (90.266 kg), last menstrual period 01/16/2002, SpO2 97 %. General: No apparent distress alert and oriented x3 mood and affect normal appears ill.  Respiratory: Patient's recent full sentences and does not appear short of breath significant congestion and cough noted + pain to percussion over bilateral frontal sinuses.  Skin: Warm dry intact with no signs of infection or rash  Neuro: Cranial nerves II through XII are intact, neurovascularly intact in all extremities with 2+ DTRs and 2+ pulses.   Back Exam:  Inspection: Unremarkable  Motion: Flexion 35 deg, Extension 15 deg, Side Bending to 15 deg bilaterally,  Rotation to 30 deg bilaterally significant tightness from previous exam. SLR laying: Negative  XSLR laying: Negative  Palpable tenderness:  Continued discomfort of multiple joints but does have significantly less swelling of the DIPs  bilaterally FABER: negative. Sensory change: Gross sensation intact to all lumbar and sacral dermatomes.  Reflexes: 2+ at both patellar tendons, 2+ at achilles tendons, Babinski's downgoing.  Strength at foot  Plantar-flexion: 5/5 Dorsi-flexion: 5/5 Eversion: 5/5 Inversion: 5/5  Leg strength  Quad: 5/5 Hamstring: 5/5 Hip flexor: 5/5 Hip abductors: 4/5  Gait unremarkable.  Left ankle exam shows the patient does have mild joint effusion as well as soft tissue and edema. Patient is severely tender to palpation over the navicular bone. Even to light palpation. Patient is barely able to ambulate on this leg at this time.  OMT findings  Cervical C2 flexed rotated and side bent left C4 flexed roatetaed and side bent left  C5 extended rotated and side bent left  thoracic  T1 E RS right  T6 extended rotated and side bent  right  Lumbar L2 flexed rotated and side bent right L4 F RS right  Sacrum Left on left Neutral

## 2015-01-21 NOTE — Assessment & Plan Note (Signed)
Patient does have some chronic pain overall. I do think that this is more of a seronegative rheumatoid arthritis at this time. We discussed possible different treatment options at this time. 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 These could be secondary to acute phase reactants during one of her seronegative rheumatoid arthritis flares. We discussed the possibility of different treatment options. Patient would like to continue with the B12 and would like to avoid methyl folate, methotrexate or Plaquenil at this time which are all options for patient. Patient is going to remain active as much as possible. Significant patient's foot pain I am concerned that there is a navicular stress fracture that we'll likely limit this and probably cause some back exacerbation. Patient come back and see me again in 3 weeks.

## 2015-01-21 NOTE — Assessment & Plan Note (Signed)
Due to the severe tenderness of the medial aspect of her ankle and do think the patient does have a navicular stress fracture. X-rays are pending. Patient was put in a Cam Walker at this time. We discussed patient should be nonweightbearing but secondary to her other comorbidities, knee arthritis, I do not think that this will be likely to tolerate even a kneeling walker. We discussed avoiding any walking outside of the Cam Walker for likely somewhere between 3 and 6 weeks with the possibility of even 12 weeks. Discussed with patient if no significant improvement we may need to consider advanced treatment options including high-dose vitamin D which was prescribed today and we will consider adding Fosamax. Also we may need advanced imaging if we do not see any significant healing to see if any surgical intervention will be needed. Patient again in 3 weeks.

## 2015-01-21 NOTE — Assessment & Plan Note (Signed)
    Decision today to treat with OMT was based on Physical Exam  After verbal consent patient was treated with , ME, soft tissue techniques in cervical thoracic, lumbar and sacral areas  Patient tolerated the procedure well with improvement in symptoms  Patient given exercises, stretches and lifestyle modifications  See medications in patient instructions if given  Patient will follow up in 3  weeks will increase frequency with patient being in a boot.

## 2015-01-21 NOTE — Progress Notes (Signed)
Pre visit review using our clinic review tool, if applicable. No additional management support is needed unless otherwise documented below in the visit note. 

## 2015-01-22 ENCOUNTER — Encounter: Payer: Self-pay | Admitting: Family Medicine

## 2015-01-25 ENCOUNTER — Telehealth: Payer: Self-pay | Admitting: Family Medicine

## 2015-01-25 NOTE — Telephone Encounter (Signed)
Pt called and she has some questions she would like to ask regarding last her last appointment.  She was going to make an appointment for that but you are all booked up till next week. She also sent an email stating she is going to need a different size boot.  Can you please give her a call.

## 2015-01-25 NOTE — Telephone Encounter (Signed)
Spoke with patient regarding her boot and a few other questions she had regarding wearing the boot and her other fooot.  Will call patient once other size of boot comes in

## 2015-01-26 ENCOUNTER — Telehealth: Payer: Self-pay

## 2015-01-26 NOTE — Telephone Encounter (Signed)
Spoke with patient to set up time to exchange boot.

## 2015-01-28 ENCOUNTER — Other Ambulatory Visit: Payer: Self-pay

## 2015-01-28 ENCOUNTER — Telehealth: Payer: Self-pay | Admitting: Family Medicine

## 2015-01-28 DIAGNOSIS — R5383 Other fatigue: Secondary | ICD-10-CM

## 2015-01-28 DIAGNOSIS — S92252A Displaced fracture of navicular [scaphoid] of left foot, initial encounter for closed fracture: Secondary | ICD-10-CM

## 2015-01-28 MED ORDER — VITAMIN D (ERGOCALCIFEROL) 1.25 MG (50000 UNIT) PO CAPS: 50000.0000 [IU] | ORAL_CAPSULE | ORAL | Status: DC

## 2015-01-28 NOTE — Telephone Encounter (Signed)
Spoke with patient to confirm referral to pulmonology. Also confirmed would send in prescription for Vitamin D

## 2015-01-28 NOTE — Telephone Encounter (Signed)
Any of the pulmonology upstairs would be fine.

## 2015-01-28 NOTE — Telephone Encounter (Signed)
Pt has called regarding the Vitamin D you wanted her to take and about a referral to pulmonary for sleep apnea. Can you please give her a call back

## 2015-02-02 ENCOUNTER — Encounter: Payer: Self-pay | Admitting: Family Medicine

## 2015-02-11 ENCOUNTER — Ambulatory Visit (INDEPENDENT_AMBULATORY_CARE_PROVIDER_SITE_OTHER): Payer: Medicare Other | Admitting: Family Medicine

## 2015-02-11 ENCOUNTER — Other Ambulatory Visit: Payer: Medicare Other

## 2015-02-11 ENCOUNTER — Encounter: Payer: Self-pay | Admitting: Family Medicine

## 2015-02-11 VITALS — BP 122/76 | HR 86

## 2015-02-11 DIAGNOSIS — M9901 Segmental and somatic dysfunction of cervical region: Secondary | ICD-10-CM | POA: Diagnosis not present

## 2015-02-11 DIAGNOSIS — M2550 Pain in unspecified joint: Secondary | ICD-10-CM | POA: Diagnosis not present

## 2015-02-11 DIAGNOSIS — S92252A Displaced fracture of navicular [scaphoid] of left foot, initial encounter for closed fracture: Secondary | ICD-10-CM

## 2015-02-11 DIAGNOSIS — M999 Biomechanical lesion, unspecified: Secondary | ICD-10-CM

## 2015-02-11 DIAGNOSIS — M9903 Segmental and somatic dysfunction of lumbar region: Secondary | ICD-10-CM | POA: Diagnosis not present

## 2015-02-11 DIAGNOSIS — M9908 Segmental and somatic dysfunction of rib cage: Secondary | ICD-10-CM

## 2015-02-11 MED ORDER — ALLOPURINOL 100 MG PO TABS: 100.0000 mg | ORAL_TABLET | Freq: Three times a day (TID) | ORAL | Status: DC

## 2015-02-11 NOTE — Assessment & Plan Note (Signed)
    Decision today to treat with OMT was based on Physical Exam  After verbal consent patient was treated with , ME, soft tissue techniques in cervical thoracic, lumbar and sacral areas  Patient tolerated the procedure well with improvement in symptoms  Patient given exercises, stretches and lifestyle modifications  See medications in patient instructions if given  Patient will follow up in 3  weeks will increase frequency with patient being in a boot.

## 2015-02-11 NOTE — Assessment & Plan Note (Signed)
With the significant amount of arthralgia multiple joints I do think the patient has a seronegative rheumatoid arthritis. Has been worked up previously. We've discussed the possibility of a disease modifying medications in patient with not like to do at this point. Patient wants to didn't address it again in 3 weeks.

## 2015-02-11 NOTE — Assessment & Plan Note (Signed)
Patient is making progress at this time. Likely will continue to do well. We discussed icing regimen and home exercises. We discussed continuing the [place] for another 3 weeks. We discussed vitamin D supplementation. Patient will continue the high-dose. Patient and will come back and see me again in 3 weeks

## 2015-02-11 NOTE — Patient Instructions (Signed)
Good to see you  Try a dr scholes gel insert or some type of padding in the boot I think we took the pressure off the toe.  You need it again another 3 weeks Continue the turmeric Do the vitamin D 50000 IU daily You are healing which is a good sign.  OK to stand in the shower and a little standing during the day  See me again in 3 weeks and have a great trip

## 2015-02-11 NOTE — Progress Notes (Signed)
CC: Follow  Up for chronic pain f/u Follow-up for left-sided navicular stress fracture  Patient is also here for a manipulation. Patient does have chronic pain syndrome. Overall not hurting quite as much she was previously. States that she was on a low dose of doxycycline recently for her teeth and thinks that this could be beneficial. Patient has manage Essman states some of her vitamins. Not taking anything for pain over time. We discussed her probably having a rheumatoid arthritis and patient is seronegative. Discussed at length last time about possible immune modulators with patient continues to want to think about.  Patient was also found to have a navicular stress fracture of the ankle. Has been in the [employer]. Has had difficulty with the boot secondary to being very uncomfortable. States that it may help the pain over all but continues to give her does discomfort. Patient has been avoiding significant amount walking. Patient is accompanied with her husband who is been doing most of the work around the house. Patient would state that she is better but not a significant amount. States that the swelling though it is better.       Past Medical History  Diagnosis Date  . Allergic rhinitis, cause unspecified   . Chronic pain   . GERD (gastroesophageal reflux disease)   . HTN (hypertension)   . Sleep apnea   . Abnormal uterine bleeding   . Dyspareunia   . Fibroid   . Urinary incontinence   . Kidney stones   . Bursitis of left shoulder    Past Surgical History  Procedure Laterality Date  . Umbilical hernia repair  2010  . Carpal tunnel release  1998  . Dental implants  2010  . Esophageal dilation  2010  . Hammer toe surgery      rt  . Septoplasty    . Rotator cuff repair Right 09/2011    Dr. Tamera Punt  . Pelvic laparoscopy  1990  .  dilatation and curettage  2000  . Rotator cuff repair Left 11/11/13    Dr. Tamera Punt    Social History  Substance Use Topics  . Smoking status:  Never Smoker   . Smokeless tobacco: Never Used  . Alcohol Use: No   Allergies  Allergen Reactions  . Cefixime Swelling  . Celebrex [Celecoxib] Swelling  . Levofloxacin Other (See Comments)    Muscle and joint pain  . Other Other (See Comments)    Other Reaction: painful joints TEQUIN. TEQUIN - JOINT AND MUSCLE PAIN  . Penicillins Hives  . Robaxin [Methocarbamol]   . Tomato Hives  . Adhesive [Tape] Rash    Sensitive to EKG leads  . Tetracyclines & Related Rash   Family History  Problem Relation Age of Onset  . Rheumatologic disease Mother   . Hypertension Mother   . Hypertension Father   . Rheumatologic disease Sister   . Arthritis Sister   . Ovarian cancer Sister 75     Blood pressure 122/76, pulse 86, last menstrual period 01/16/2002, SpO2 95 %. General: No apparent distress alert and oriented x3 mood and affect normal appears ill.  Respiratory: Patient's recent full sentences and does not appear short of breath significant congestion and cough noted + pain to percussion over bilateral frontal sinuses.  Skin: Warm dry intact with no signs of infection or rash  Neuro: Cranial nerves II through XII are intact, neurovascularly intact in all extremities with 2+ DTRs and 2+ pulses.  Antalgic gait secondary to patient being  in the Cam Walker Back Exam:  Inspection: Unremarkable  Motion: Flexion 35 deg, Extension 15 deg, Side Bending to 15 deg bilaterally,  Rotation to 30 deg bilaterally significant tightness from previous exam. SLR laying: Negative  XSLR laying: Negative  Palpable tenderness:  Continued discomfort of multiple joints many somewhat better than previous exam FABER: negative. Sensory change: Gross sensation intact to all lumbar and sacral dermatomes.  Reflexes: 2+ at both patellar tendons, 2+ at achilles tendons, Babinski's downgoing.  Strength at foot  Plantar-flexion: 5/5 Dorsi-flexion: 5/5 Eversion: 5/5 Inversion: 5/5  Leg strength  Quad: 5/5 Hamstring:  5/5 Hip flexor: 5/5 Hip abductors: 4/5    Left ankle exam shows t no effusion. Patient is still tender to palpation over the navicular bone.  Limited musculoskeletal ultrasound was performed and interpreted by Hulan Saas, M  Limited ultrasound patient's left ankle shows the patient's navicular bone where the stress fracture was initially an avulsion fracture. Significant less hypoechoic changes in the area. Patient does have intersubstance tearing of the posterior tibialis tendon. Seems to be healing with increasing Doppler flow. Impression: Healing navicular fracture as well as posterior tibialis tendon tear.  OMT findings  Cervical C2 flexed rotated and side bent left C5 extended rotated and side bent left  thoracic  T1 E RS right  T6 extended rotated and side bent  right  Lumbar L2 flexed rotated and side bent right  Sacrum Left on left

## 2015-02-15 ENCOUNTER — Encounter: Payer: Self-pay | Admitting: Pulmonary Disease

## 2015-02-15 ENCOUNTER — Ambulatory Visit (INDEPENDENT_AMBULATORY_CARE_PROVIDER_SITE_OTHER): Payer: Medicare Other | Admitting: Pulmonary Disease

## 2015-02-15 VITALS — BP 122/76 | HR 97 | Ht 60.0 in | Wt 203.0 lb

## 2015-02-15 DIAGNOSIS — G4733 Obstructive sleep apnea (adult) (pediatric): Secondary | ICD-10-CM | POA: Diagnosis not present

## 2015-02-15 NOTE — Progress Notes (Signed)
Past Medical History She  has a past medical history of Allergic rhinitis, cause unspecified; Chronic pain; GERD (gastroesophageal reflux disease); HTN (hypertension); Sleep apnea; Abnormal uterine bleeding; Dyspareunia; Fibroid; Urinary incontinence; Kidney stones; and Bursitis of left shoulder.  Past Surgical History She  has past surgical history that includes Umbilical hernia repair (2010); Carpal tunnel release (1998); dental implants (2010); Esophageal dilation (2010); Hammer toe surgery; Septoplasty; Rotator cuff repair (Right, 09/2011); Pelvic laparoscopy (1990);  dilatation and curettage (2000); and Rotator cuff repair (Left, 11/11/13).  Current Outpatient Prescriptions on File Prior to Visit  Medication Sig  . albendazole (ALBENZA) 200 MG tablet Take 2 tablets (400 mg total) by mouth 2 (two) times daily. (Patient not taking: Reported on 12/31/2014)  . allopurinol (ZYLOPRIM) 100 MG tablet Take 1 tablet (100 mg total) by mouth 3 (three) times daily.  . Beta Carotene (VITAMIN A) 25000 UNIT capsule Take 25,000 Units by mouth every other day.   . Cholecalciferol (VITAMIN D3) 5000 UNITS TABS Take 1 tablet by mouth daily.  . cyanocobalamin (,VITAMIN B-12,) 1000 MCG/ML injection DO AN INJECTION INTRAMUSCULAR WEEKLY FOR THE FIRST 4 WEEKS, THEN INJECT MONTHLY THEREAFTER.  . Cyanocobalamin (VITAMIN B 12 PO) Place 5,000 mcg under the tongue every 30 (thirty) days.   . Menaquinone-7 (VITAMIN K2 PO) Take by mouth daily.  . Multiple Vitamin (MULTIVITAMIN) tablet Take 1 tablet by mouth daily.  . Needles & Syringes MISC 1 Can by Does not apply route once a week. Dispense meals for B12 IM injections please thank you  . Omega-3 Fatty Acids (FISH OIL) 1000 MG CAPS Take 1 capsule by mouth every other day.  . Probiotic Product (PROBIOTIC DAILY PO) Take 1 capsule by mouth daily.  Marland Kitchen pyridOXINE (B-6) 50 MG tablet Take 50 mg by mouth daily. Once every other day  . vitamin C (ASCORBIC ACID) 500 MG tablet Take by  mouth daily.   . Vitamin D, Ergocalciferol, (DRISDOL) 50000 units CAPS capsule Take 1 capsule (50,000 Units total) by mouth every 7 (seven) days.  . Zinc Gluconate 15 MG TABS Take by mouth daily.   No current facility-administered medications on file prior to visit.    Allergies  Allergen Reactions  . Cefixime Swelling  . Celebrex [Celecoxib] Swelling  . Levofloxacin Other (See Comments)    Muscle and joint pain  . Other Other (See Comments)    Other Reaction: painful joints TEQUIN. TEQUIN - JOINT AND MUSCLE PAIN  . Penicillins Hives  . Robaxin [Methocarbamol]   . Tomato Hives  . Adhesive [Tape] Rash    Sensitive to EKG leads  . Tetracyclines & Related Rash    Family History Her family history includes Arthritis in her sister; Hypertension in her father and mother; Ovarian cancer (age of onset: 71) in her sister; Rheumatologic disease in her mother and sister.  Social History She  reports that she has never smoked. She has never used smokeless tobacco. She reports that she does not drink alcohol or use illicit drugs.  Review of systems Constitutional: Negative for fever and unexpected weight change.  HENT: Negative for congestion, dental problem, ear pain, nosebleeds, postnasal drip, rhinorrhea, sinus pressure, sneezing, sore throat and trouble swallowing.        Allergies  Eyes: Negative for redness and itching.  Respiratory: Negative for cough, chest tightness, shortness of breath and wheezing.   Cardiovascular: Negative for palpitations and leg swelling.  Gastrointestinal: Negative for nausea and vomiting.  Genitourinary: Negative for dysuria.  Musculoskeletal: Negative for joint  swelling.  Skin: Negative for rash.  Neurological: Negative for headaches.  Hematological: Does not bruise/bleed easily.  Psychiatric/Behavioral: Negative for dysphoric mood. The patient is not nervous/anxious.     Chief Complaint  Patient presents with  . Sleep Consult    Referred by Dr  Tamala Julian for OSA. Current CPAP pressure 8cmH2O. Wears nightly x 4.5hrs. Reports 15-20lb weight loss since last sleep study. Would like to discuss concerns with current diagnosis and related health issues. Epworth Score: 14    Tests: CPAP 11/17/14 to 02/14/15 >> used on 90 of 90 nights with average 4 hrs and 28 min.  Average AHI is 0.2 with CPAP 8 cm H2O.   Vital signs BP 122/76 mmHg  Pulse 97  Ht 5' (1.524 m)  Wt 203 lb (92.08 kg)  BMI 39.65 kg/m2  SpO2 97%  LMP 01/16/2002  History of Present Illness Kerry [name] is a 65 y.o. female for evaluation of sleep problems.  She has hx of sleep apnea.  She had sleep study in early 2000's and saw Dr. Gwenette Greet.  She couldn't tolerate CPAP then, and had surgery with ENT >> septoplasty and turbinate reduction.  She was breathing better while asleep, but then was found to have irregular heart rate.  She had repeat sleep study, and showed sleep apnea was still present.  She was restarted on CPAP.  She was doing okay until recently.  She got a new machine, and feels like she has more trouble breathing out against machine.  Her mask fit is okay.    She will sleep in a recliner for 2 to 4 hours.  She then goes to bed, puts on CPAP, and sleeps for another 4 to 5 hours.  She goes to the bathroom once during this process, but otherwise sleeps through.  She denies morning headaches.  She is not taking anything to help her sleep.  She denies sleep walking, sleep talking, bruxism, or nightmares.  There is no history of restless legs.  She denies sleep hallucinations, sleep paralysis, or cataplexy.  The Epworth score is 14 out of 24.  Physical Exam:  General - No distress ENT - No sinus tenderness, no oral exudate, no LAN, no thyromegaly, TM clear, pupils equal/reactive, MP 3, wears dentures Cardiac - s1s2 regular, no murmur, pulses symmetric Chest - No wheeze/rales/dullness, good air entry, normal respiratory excursion Back - No focal tenderness Abd - Soft,  non-tender, no organomegaly, + bowel sounds Ext - No edema Neuro - Normal strength, cranial nerves intact Skin - No rashes Psych - Normal mood, and behavior  Discussion: She has history of sleep apnea, and has been on CPAP therapy.  She has lost weight since original CPAP set up, and is not sure she still has sleep apnea.  She also notes sleeping better in a recliner and does so w/o CPAP, but would prefer to sleep in bed.  Assessment/plan:  Obstructive sleep apnea. Plan: - will have her expiratory pressure release adjusted to 3 - will arrange for home sleep study to assess current status of her sleep apnea, and then determine is she needs to continue CPAP therapy - encouraged her to continue with her weight loss efforts   Patient Instructions  Will arrange for home sleep study Will call to schedule follow up after home sleep study reviewed     Chesley Mires, M.D. Pager [phone]

## 2015-02-15 NOTE — Patient Instructions (Signed)
Will arrange for home sleep study Will call to schedule follow up after home sleep study reviewed 

## 2015-02-15 NOTE — Progress Notes (Signed)
   Subjective:    Patient ID: Kerry [name], female    DOB: 23-Apr-1950, 65 y.o.   MRN: [id number]  HPI    Review of Systems  Constitutional: Negative for fever and unexpected weight change.  HENT: Negative for congestion, dental problem, ear pain, nosebleeds, postnasal drip, rhinorrhea, sinus pressure, sneezing, sore throat and trouble swallowing.        Allergies  Eyes: Negative for redness and itching.  Respiratory: Negative for cough, chest tightness, shortness of breath and wheezing.   Cardiovascular: Negative for palpitations and leg swelling.  Gastrointestinal: Negative for nausea and vomiting.  Genitourinary: Negative for dysuria.  Musculoskeletal: Negative for joint swelling.  Skin: Negative for rash.  Neurological: Negative for headaches.  Hematological: Does not bruise/bleed easily.  Psychiatric/Behavioral: Negative for dysphoric mood. The patient is not nervous/anxious.        Objective:   Physical Exam        Assessment & Plan:

## 2015-03-04 ENCOUNTER — Ambulatory Visit (INDEPENDENT_AMBULATORY_CARE_PROVIDER_SITE_OTHER): Payer: Medicare Other | Admitting: Family Medicine

## 2015-03-04 ENCOUNTER — Encounter: Payer: Self-pay | Admitting: Family Medicine

## 2015-03-04 VITALS — BP 140/82 | HR 94 | Ht 60.0 in

## 2015-03-04 DIAGNOSIS — M9901 Segmental and somatic dysfunction of cervical region: Secondary | ICD-10-CM

## 2015-03-04 DIAGNOSIS — G4733 Obstructive sleep apnea (adult) (pediatric): Secondary | ICD-10-CM | POA: Diagnosis not present

## 2015-03-04 DIAGNOSIS — M9908 Segmental and somatic dysfunction of rib cage: Secondary | ICD-10-CM

## 2015-03-04 DIAGNOSIS — S92252A Displaced fracture of navicular [scaphoid] of left foot, initial encounter for closed fracture: Secondary | ICD-10-CM

## 2015-03-04 DIAGNOSIS — M545 Low back pain, unspecified: Secondary | ICD-10-CM

## 2015-03-04 DIAGNOSIS — M9903 Segmental and somatic dysfunction of lumbar region: Secondary | ICD-10-CM

## 2015-03-04 DIAGNOSIS — M999 Biomechanical lesion, unspecified: Secondary | ICD-10-CM

## 2015-03-04 NOTE — Assessment & Plan Note (Signed)
    Decision today to treat with OMT was based on Physical Exam  After verbal consent patient was treated with , ME, soft tissue techniques in cervical thoracic, lumbar and sacral areas  Patient tolerated the procedure well with improvement in symptoms  Patient given exercises, stretches and lifestyle modifications  See medications in patient instructions if given  Patient will follow up in 3  weeks will increase frequency with patient being in a boot.

## 2015-03-04 NOTE — Assessment & Plan Note (Signed)
Patient continues to have low back pain is worse recently secondary to patient's immobilization. Patient attempted to respond well to a supine manipulation today but had some difficulty. Patient and will come back and see me again in 3 weeks for further evaluation and treatment.

## 2015-03-04 NOTE — Progress Notes (Signed)
Pre visit review using our clinic review tool, if applicable. No additional management support is needed unless otherwise documented below in the visit note. 

## 2015-03-04 NOTE — Patient Instructions (Addendum)
I am so sorry you are hurting at this time.  We will try the other boot.  Sorry for the look but hopefully it helps Look up and read about fosamax, methotrexate and plaquenil.  I know it could be some scary reading but I do feel we will need to try one of these at some point We will get an MRI of the foot and I will write you on what is going on and what are our options.  Continue everything else Not a lot of movement today with the manipulation but hopefully it helps Duexis 3 times a day for 3 days See me again though in 3 weeks 30 minute appointment please

## 2015-03-04 NOTE — Assessment & Plan Note (Signed)
Impression continues to have significant difficult and is actually worse than previous exam. Patient was put in a Agricultural engineer. Patient went over to attempt to get a scooter and unfortunately because of her height she was unable to get one that would work. At this time I feel that advance imaging is warranted. Patient with an MRI of the foot to further evaluate the navicular bone for any signs of avascular necrosis. I'm sure the patient will have significant arthritic changes as well. We will see if patient will need surgical management for possible other treatment options. We suggested other ones which patient declined right now because she is concern for possible side effects to the medications. I still think that there is concern for a seronegative rheumatoid arthritis the patient still wants to hold on any type of treatment.

## 2015-03-04 NOTE — Progress Notes (Signed)
CC: Follow  Up for chronic pain f/u Follow-up for left-sided navicular stress fracture  Patient is also here for a manipulation. Patient does have chronic pain syndrome. Patient is having more difficulty in having more pain because she is less active. Patient was doing better when she was doing more activity. Patient denies any numbness. States though that it seems to be her whole axial skeletal. Nothing specific overall just worsening exacerbation.  Patient was also found to have a navicular stress fracture of the ankle. Has been in the [employer] and has been in it for 6 weeks. Continues to have difficulty. Continues to have significant amount of pain. Was doing better actually 3 weeks ago and she is doing now. States that she has pain when she is nonweightbearing. States that the pain is even wake her up at night.       Past Medical History  Diagnosis Date  . Allergic rhinitis, cause unspecified   . Chronic pain   . GERD (gastroesophageal reflux disease)   . HTN (hypertension)   . Sleep apnea   . Abnormal uterine bleeding   . Dyspareunia   . Fibroid   . Urinary incontinence   . Kidney stones   . Bursitis of left shoulder    Past Surgical History  Procedure Laterality Date  . Umbilical hernia repair  2010  . Carpal tunnel release  1998  . Dental implants  2010  . Esophageal dilation  2010  . Hammer toe surgery      rt  . Septoplasty    . Rotator cuff repair Right 09/2011    Dr. Tamera Punt  . Pelvic laparoscopy  1990  .  dilatation and curettage  2000  . Rotator cuff repair Left 11/11/13    Dr. Tamera Punt    Social History  Substance Use Topics  . Smoking status: Never Smoker   . Smokeless tobacco: Never Used  . Alcohol Use: No   Allergies  Allergen Reactions  . Cefixime Swelling  . Celebrex [Celecoxib] Swelling  . Levofloxacin Other (See Comments)    Muscle and joint pain  . Other Other (See Comments)    Other Reaction: painful joints TEQUIN. TEQUIN - JOINT AND  MUSCLE PAIN  . Penicillins Hives  . Robaxin [Methocarbamol]   . Tomato Hives  . Adhesive [Tape] Rash    Sensitive to EKG leads  . Tetracyclines & Related Rash   Family History  Problem Relation Age of Onset  . Rheumatologic disease Mother   . Hypertension Mother   . Hypertension Father   . Rheumatologic disease Sister   . Arthritis Sister   . Ovarian cancer Sister 45     Blood pressure 140/82, pulse 94, height 5' (1.524 m), last menstrual period 01/16/2002, SpO2 95 %. General: No apparent distress alert and oriented x3 mood and affect normal appears ill.  Respiratory: Patient's recent full sentences and does not appear short of breath significant congestion and cough noted + pain to percussion over bilateral frontal sinuses.  Skin: Warm dry intact with no signs of infection or rash  Neuro: Cranial nerves II through XII are intact, neurovascularly intact in all extremities with 2+ DTRs and 2+ pulses.  Antalgic gait secondary to patient being in the [employer] Back Exam:  Inspection: Unremarkable  Motion: Flexion 35 deg, Extension 15 deg, Side Bending to 15 deg bilaterally,  Rotation to 30 deg bilaterally significant tightness from previous exam. SLR laying: Negative  XSLR laying: Negative  Palpable tenderness:  Worsening  muscle tightness of the paraspinal musculature from several down to the lumbar spine. No midline tenderness. FABER: negative. Sensory change: Gross sensation intact to all lumbar and sacral dermatomes.  Reflexes: 2+ at both patellar tendons, 2+ at achilles tendons, Babinski's downgoing.  Strength at foot  Plantar-flexion: 5/5 Dorsi-flexion: 5/5 Eversion: 5/5 Inversion: 5/5  Leg strength  Mild increasing weakness of the hip flexors and quadriceps tendons.   Patient is severely tender over the navicular bone. Seems worse than previously. Mild increase in swelling from previous exam. Neurovascular intact distally.   OMT findings  Cervical C2 flexed rotated  and side bent left C5 extended rotated and side bent left  thoracic  T1 E RS right  T6 extended rotated and side bent  right  Lumbar L2 flexed rotated and side bent right  Sacrum Left on left

## 2015-03-05 ENCOUNTER — Telehealth: Payer: Self-pay | Admitting: Pulmonary Disease

## 2015-03-05 DIAGNOSIS — G4733 Obstructive sleep apnea (adult) (pediatric): Secondary | ICD-10-CM | POA: Insufficient documentation

## 2015-03-05 NOTE — Telephone Encounter (Signed)
HST 03/04/15 >> AHI 21.6, SpO2 low 80%.  Will have my nurse inform pt that sleep study shows moderate sleep apnea.  Options are 1) CPAP now, 2) ROV first.  If She is agreeable to CPAP, then please send order for auto CPAP range 5 to 15 cm H2O with heated humidity and mask of choice.  Have download sent 1 month after starting CPAP and set up ROV 2 months after starting CPAP.  ROV can be with me or nurse practitioner.

## 2015-03-09 NOTE — Telephone Encounter (Signed)
Spoke with pt. She would like to have an OV first before CPAP therapy. ROV has been scheduled for 03/10/2015 at 9:30am with VS. Nothing further was needed.

## 2015-03-10 ENCOUNTER — Encounter: Payer: Self-pay | Admitting: Pulmonary Disease

## 2015-03-10 ENCOUNTER — Ambulatory Visit (INDEPENDENT_AMBULATORY_CARE_PROVIDER_SITE_OTHER): Payer: Medicare Other | Admitting: Pulmonary Disease

## 2015-03-10 VITALS — BP 144/84 | HR 84 | Ht 60.0 in | Wt 212.0 lb

## 2015-03-10 DIAGNOSIS — G4733 Obstructive sleep apnea (adult) (pediatric): Secondary | ICD-10-CM | POA: Diagnosis not present

## 2015-03-10 DIAGNOSIS — Z6841 Body Mass Index (BMI) 40.0 and over, adult: Secondary | ICD-10-CM

## 2015-03-10 DIAGNOSIS — Z9989 Dependence on other enabling machines and devices: Secondary | ICD-10-CM

## 2015-03-10 NOTE — Progress Notes (Signed)
Current Outpatient Prescriptions on File Prior to Visit  Medication Sig  . allopurinol (ZYLOPRIM) 100 MG tablet Take 1 tablet (100 mg total) by mouth 3 (three) times daily.  . Beta Carotene (VITAMIN A) 25000 UNIT capsule Take 25,000 Units by mouth every other day.   . Cholecalciferol (VITAMIN D3) 5000 UNITS TABS Take 1 tablet by mouth daily.  . CHROMIUM POLYNICOTINATE PO Take 1,800 mcg/day by mouth. 3 tabs @@600mcg   . cyanocobalamin (,VITAMIN B-12,) 1000 MCG/ML injection DO AN INJECTION INTRAMUSCULAR WEEKLY FOR THE FIRST 4 WEEKS, THEN INJECT MONTHLY THEREAFTER. (Patient taking differently: DO AN INJECTION INTRAMUSCULAR TWICE WEEKLY)  . Cyanocobalamin (VITAMIN B 12 PO) Place 5,000 mcg under the tongue daily.   . Menaquinone-7 (VITAMIN K2 PO) Take by mouth daily.  . Multiple Vitamin (MULTIVITAMIN) tablet Take 1 tablet by mouth daily.  . Needles & Syringes MISC 1 Can by Does not apply route once a week. Dispense meals for B12 IM injections please thank you  . Omega-3 Fatty Acids (FISH OIL) 1000 MG CAPS Take 1 capsule by mouth every other day.  . Probiotic Product (PROBIOTIC DAILY PO) Take 1 capsule by mouth daily.  Marland Kitchen pyridOXINE (B-6) 50 MG tablet Take 50 mg by mouth daily. Once every other day  . Saccharomyces boulardii 250 MG CHEW Chew 500 mg/day by mouth.  . vitamin C (ASCORBIC ACID) 500 MG tablet Take by mouth daily.    No current facility-administered medications on file prior to visit.     Chief Complaint  Patient presents with  . Follow-up    Review Sleep Study. Feels the CPAP machine is not working properly, feels that it is pausing for a few seconds and then restarts. DME :AHP     Tests HST 03/04/15 >> AHI 21.6, SpO2 low 80% CPAP 02/08/15 to 03/09/15 >> used on 29 of 30 nights with average 4 hrs and 42 min.  Average AHI is 0.2 with CPAP 8 cm H2O.  Past medical hx Allergies, GERD, HTN, Nephrolithiasis, Chronic pain  Past surgical hx, Allergies, Family hx, Social hx all  reviewed.  Vital Signs BP 144/84 mmHg  Pulse 84  Ht 5' (1.524 m)  Wt 212 lb (96.163 kg)  BMI 41.40 kg/m2  SpO2 97%  LMP 01/16/2002  History of Present Illness Kerry [name] is a 65 y.o. female with obstructive sleep apnea.  She had repeat home sleep study.  This showed persistent sleep apnea with an AHI of 21.6.  Her more recent CPAP download showed good control of sleep apnea.  She continues pattern of sleeping in bed with CPAP for about 4 to 5 hours, and then sleeps in a recliner.  Physical Exam  General - No distress ENT - No sinus tenderness, no oral exudate, no LAN, MP 3, wears dentures. Cardiac - s1s2 regular, no murmur Chest - No wheeze/rales/dullness Back - No focal tenderness Abd - Soft, non-tender Ext - No edema Neuro - Normal strength Skin - No rashes Psych - normal mood, and behavior  Discussion She was hopeful to have improvement in sleep apnea after recent weight loss.  Her home sleep study shows moderate sleep apnea >> explained that home sleep studies tend to underestimate severity of sleep apnea.  Assessment/Plan  Obstructive sleep apnea. Plan: - will change to auto CPAP - will get download 1 month after pressure change - advised her to use CPAP whenever she is asleep  Obesity. Plan: - discussed importance of weight loss   Patient Instructions  Will change  to auto CPAP setting with pressure range of 5 to 15 cm H2O  Will call with CPAP report after pressure change  Follow up in 4 months     Chesley Mires, MD [facility] Pager:  [phone]

## 2015-03-10 NOTE — Patient Instructions (Signed)
Will change to auto CPAP setting with pressure range of 5 to 15 cm H2O  Will call with CPAP report after pressure change  Follow up in 4 months

## 2015-03-11 ENCOUNTER — Other Ambulatory Visit: Payer: Self-pay | Admitting: *Deleted

## 2015-03-11 DIAGNOSIS — G4733 Obstructive sleep apnea (adult) (pediatric): Secondary | ICD-10-CM

## 2015-03-12 ENCOUNTER — Ambulatory Visit
Admission: RE | Admit: 2015-03-12 | Discharge: 2015-03-12 | Disposition: A | Discharge status: Home / Self Care | Payer: BC Managed Care – PPO | Source: Ambulatory Visit | Attending: Family Medicine | Admitting: Family Medicine

## 2015-03-12 DIAGNOSIS — S86812A Strain of other muscle(s) and tendon(s) at lower leg level, left leg, initial encounter: Secondary | ICD-10-CM | POA: Diagnosis not present

## 2015-03-12 DIAGNOSIS — S92252A Displaced fracture of navicular [scaphoid] of left foot, initial encounter for closed fracture: Secondary | ICD-10-CM

## 2015-03-19 ENCOUNTER — Encounter: Payer: Self-pay | Admitting: Family Medicine

## 2015-03-19 DIAGNOSIS — S86112A Strain of other muscle(s) and tendon(s) of posterior muscle group at lower leg level, left leg, initial encounter: Secondary | ICD-10-CM

## 2015-03-23 ENCOUNTER — Encounter: Payer: Self-pay | Admitting: Pulmonary Disease

## 2015-03-24 ENCOUNTER — Encounter: Payer: Self-pay | Admitting: Family Medicine

## 2015-03-25 ENCOUNTER — Encounter: Payer: Self-pay | Admitting: *Deleted

## 2015-03-25 ENCOUNTER — Encounter: Payer: Self-pay | Admitting: Family Medicine

## 2015-03-25 ENCOUNTER — Ambulatory Visit (INDEPENDENT_AMBULATORY_CARE_PROVIDER_SITE_OTHER): Payer: Medicare Other | Admitting: Family Medicine

## 2015-03-25 VITALS — BP 138/84 | HR 98 | Ht 60.0 in

## 2015-03-25 DIAGNOSIS — M9901 Segmental and somatic dysfunction of cervical region: Secondary | ICD-10-CM | POA: Diagnosis not present

## 2015-03-25 DIAGNOSIS — G8929 Other chronic pain: Secondary | ICD-10-CM

## 2015-03-25 DIAGNOSIS — M999 Biomechanical lesion, unspecified: Secondary | ICD-10-CM

## 2015-03-25 DIAGNOSIS — S86112A Strain of other muscle(s) and tendon(s) of posterior muscle group at lower leg level, left leg, initial encounter: Secondary | ICD-10-CM

## 2015-03-25 DIAGNOSIS — R790 Abnormal level of blood mineral: Secondary | ICD-10-CM | POA: Diagnosis not present

## 2015-03-25 DIAGNOSIS — M9903 Segmental and somatic dysfunction of lumbar region: Secondary | ICD-10-CM | POA: Diagnosis not present

## 2015-03-25 DIAGNOSIS — R6884 Jaw pain: Secondary | ICD-10-CM | POA: Diagnosis not present

## 2015-03-25 DIAGNOSIS — S96812A Strain of other specified muscles and tendons at ankle and foot level, left foot, initial encounter: Secondary | ICD-10-CM

## 2015-03-25 DIAGNOSIS — M9908 Segmental and somatic dysfunction of rib cage: Secondary | ICD-10-CM

## 2015-03-25 DIAGNOSIS — M76829 Posterior tibial tendinitis, unspecified leg: Secondary | ICD-10-CM | POA: Insufficient documentation

## 2015-03-25 NOTE — Assessment & Plan Note (Signed)
Decision today to treat with OMT was based on Physical Exam  After verbal consent patient was treated with , ME, soft tissue techniques in cervical thoracic, lumbar and sacral areas  Patient tolerated the procedure well with improvement in symptoms  Patient given exercises, stretches and lifestyle modifications  See medications in patient instructions if given  Patient will follow up in 4-6  weeks                           

## 2015-03-25 NOTE — Progress Notes (Signed)
Pre visit review using our clinic review tool, if applicable. No additional management support is needed unless otherwise documented below in the visit note. 

## 2015-03-25 NOTE — Assessment & Plan Note (Signed)
Continues to be having chronic pain. Patient is going to be treated for a recent parasitic infection and no flatus will help some of this. This will be by another provider. Patient and will come back and see me again in 4-6 weeks for further evaluation. We'll continue the osteopathic manipulation. I would like to get patient on an exercise prescription but this is not warranted with patient's ankle at this time.

## 2015-03-25 NOTE — Progress Notes (Signed)
CC: Follow  Up for chronic pain f/u Follow-up for left-sided navicular stress fracture  Patient is also here for a manipulation. Patient does have chronic pain syndrome. Possibility for seronegative rheumatoid arthritis as well. Patient also has been recently diagnosed with a parasitic infection and will be treated for this. Patient is hoping that this is causing some of the pain. Patient is also being seen for manipulation. States that he continues to be very helpful. Continues he over-the-counter medications.  Patient was continuing to have unfortunately ankle pain. At that point with patient not improving with conservative therapy patient was sent for an MRI. MRI was reviewed in independently visualized by me. Patient's MRI of the ankle show that patient did have a full tear of the posterior tendon. Patient is awaiting referral to the orthopedic surgeon for further evaluation. Continues to have pain.    Past Medical History  Diagnosis Date  . Allergic rhinitis, cause unspecified   . Chronic pain   . GERD (gastroesophageal reflux disease)   . HTN (hypertension)   . Sleep apnea   . Abnormal uterine bleeding   . Dyspareunia   . Fibroid   . Urinary incontinence   . Kidney stones   . Bursitis of left shoulder    Past Surgical History  Procedure Laterality Date  . Umbilical hernia repair  2010  . Carpal tunnel release  1998  . Dental implants  2010  . Esophageal dilation  2010  . Hammer toe surgery      rt  . Septoplasty    . Rotator cuff repair Right 09/2011    Dr. Tamera Punt  . Pelvic laparoscopy  1990  .  dilatation and curettage  2000  . Rotator cuff repair Left 11/11/13    Dr. Tamera Punt    Social History  Substance Use Topics  . Smoking status: Never Smoker   . Smokeless tobacco: Never Used  . Alcohol Use: No   Allergies  Allergen Reactions  . Cefixime Swelling  . Celebrex [Celecoxib] Swelling  . Levofloxacin Other (See Comments)    Muscle and joint pain  . Other  Other (See Comments)    Other Reaction: painful joints TEQUIN. TEQUIN - JOINT AND MUSCLE PAIN  . Penicillins Hives  . Robaxin [Methocarbamol]   . Tomato Hives  . Adhesive [Tape] Rash    Sensitive to EKG leads  . Tetracyclines & Related Rash   Family History  Problem Relation Age of Onset  . Rheumatologic disease Mother   . Hypertension Mother   . Hypertension Father   . Rheumatologic disease Sister   . Arthritis Sister   . Ovarian cancer Sister 51     Blood pressure 138/84, pulse 98, height 5' (1.524 m), last menstrual period 01/16/2002, SpO2 96 %. General: No apparent distress alert and oriented x3 mood and affect normal appears ill.  Respiratory: Patient's recent full sentences and does not appear short of breath significant congestion and cough noted + pain to percussion over bilateral frontal sinuses.  Skin: Warm dry intact with no signs of infection or rash  Neuro: Cranial nerves II through XII are intact, neurovascularly intact in all extremities with 2+ DTRs and 2+ pulses.  Antalgic gait secondary to patient being in the [employer] Back Exam:  Inspection: Unremarkable  Motion: Flexion 35 deg, Extension 15 deg, Side Bending to 15 deg bilaterally,  Rotation to 30 deg bilaterally continued tightness SLR laying: Negative  XSLR laying: Negative  Continued tightness from previous exam and generalized tenderness  to palpation of the paraspinal musculature from cervical to thoracic and lumbarprocess tenderness FABER: negative. Sensory change: Gross sensation intact to all lumbar and sacral dermatomes.  Reflexes: 2+ at both patellar tendons, 2+ at achilles tendons, Babinski's downgoing.  Strength at foot  Plantar-flexion: 5/5 Dorsi-flexion: 5/5 Eversion: 5/5 Inversion: 5/5  Ankle exam still shows severe tenderness to palpation at the insertion of the posterior tibialis. .    OMT findings  Cervical C2 flexed rotated and side bent left C6 extended rotated and side bent  left  thoracic  T1 E RS right  T5 extended rotated and side bent  right  Lumbar L2 flexed rotated and side bent right  Sacrum Left on left

## 2015-03-26 ENCOUNTER — Encounter: Payer: Self-pay | Admitting: Family Medicine

## 2015-03-29 ENCOUNTER — Other Ambulatory Visit: Payer: Self-pay

## 2015-03-31 ENCOUNTER — Telehealth: Payer: Self-pay | Admitting: Pulmonary Disease

## 2015-03-31 ENCOUNTER — Encounter: Payer: Self-pay | Admitting: Pulmonary Disease

## 2015-03-31 DIAGNOSIS — G4733 Obstructive sleep apnea (adult) (pediatric): Secondary | ICD-10-CM

## 2015-03-31 NOTE — Telephone Encounter (Signed)
We recevied 3 MyChart emails from the patient:   Email 1 of 3    I need advice before 04/05/2015 (date Medicare rental of my CPAP becomes a purchase).        Background. My CPAP use per night is usually 4-5 hours preceded by a few hours on a recliner. Trying to use the CPAP for longer hours increases fatigue. After my doctor moved, almost a year ago a new doctor changed my device from a ResMed AutoSense AutoSet S9 to the newer S10 and increased fixed pressure from 8 to 11. Fatigue after use was worse. Doctor visits in 2016 resulted only in seeing a nurse practitioner & reducing air pressure back down to 8. This year I asked Dr. Halford Chessman to evaluate. So far he has confirmed continuing sleep apnea, changed pressure to a variable 5 to 15, and is awaiting a 30 day report.        [facility] ([facility]) said my current S10 may be malfunctioning and will be replaced. I do not know if my increased fatigue was due to faulty equipment, if it was the increase in fixed air pressure, or if the S10 differs from S9 and is not as good for me.     Email 2 of 3    In addition, recently I read that a ResMed "Autoset for Her" S10 model is programmed differently to be more suitable for a woman (table attached from article at http://www.resmed.com/us/dam/documents/articles/1017226-women-and-osa-forher-algorithm.pdf). This was news since [facility] had said that the "Standard" S10 I am currently renting and the "For Her" model differ only cosmetically.        I called [employer] to ask if the algorithm for the AutoSet S10 is different from the S9 and to find out more about the AutoSet for Her S10 differences. They said they would discuss technical questions only with my medical provider. So [facility] ([facility]) called but only asked how the "for Her" differed from the "Standard" S10-would still like to know if the S10 operates differently from the S9.Marland Kitchen [facility] rep said she did not  understand the [employer] answer about the "for Her" model, so she asked a sales rep who restated that color was the only difference.     Email 3 of 3    Questions.    (1) Do you know how the ResMed AutoSet "Standard" S10 & the AutoSet "For Her" S10 differ? Would the "For Her" be a better choice for me? From the article: "As female OSA patients are more prone to REM-based apneic events [14], the AutoSet for Her algorithm is able to counter these events, reduce arousals, and consolidate REM sleep."        (2) Do you know if the algorithm for the Standard AutoSet S10 differs from the S9 I used from 2012 to 2016?        (3) I think I have 3 options. Please advise if you have a recommendation.    A. Accept a replacement Standard AutoSet S10 device and hope this solves the fatigue problem.    B. See if [facility] will switch to an AutoSet for Her S10 since their initial info appears to be incorrect. Then see if it improves the fatigue factor.    C. Return my current Standard AutoSet S10 CPAP to [facility] before March 20 (when the rental converts to a purchase) and revert back to using the old S9 as long as it will last.  Thanks.    VS - please advise. Thanks.

## 2015-04-02 NOTE — Telephone Encounter (Signed)
lmtcb X1 for pt  

## 2015-04-02 NOTE — Telephone Encounter (Signed)
Pt returned call. Reviewed recs. She voiced understanding and had no further questions. Order was placed to Kerry [name]. Nothing further needed

## 2015-04-02 NOTE — Telephone Encounter (Signed)
Please inform pt that the Resmed airsense 10 autoset for her is designed with a different algorithm for women.  Please order to her home care company to change to Resmed airsense 10 autoset for her, pressure range 5 to 15 cm H2O, with heated humidity.

## 2015-04-02 NOTE — Telephone Encounter (Signed)
LVM for pt to return call

## 2015-04-05 DIAGNOSIS — Z888 Allergy status to other drugs, medicaments and biological substances status: Secondary | ICD-10-CM | POA: Diagnosis not present

## 2015-04-05 DIAGNOSIS — Z881 Allergy status to other antibiotic agents status: Secondary | ICD-10-CM | POA: Diagnosis not present

## 2015-04-05 DIAGNOSIS — M76829 Posterior tibial tendinitis, unspecified leg: Secondary | ICD-10-CM | POA: Diagnosis not present

## 2015-04-05 DIAGNOSIS — Z880 Allergy status to penicillin: Secondary | ICD-10-CM | POA: Diagnosis not present

## 2015-04-05 DIAGNOSIS — M2140 Flat foot [pes planus] (acquired), unspecified foot: Secondary | ICD-10-CM | POA: Diagnosis not present

## 2015-04-05 DIAGNOSIS — Z79899 Other long term (current) drug therapy: Secondary | ICD-10-CM | POA: Diagnosis not present

## 2015-04-05 DIAGNOSIS — M76822 Posterior tibial tendinitis, left leg: Secondary | ICD-10-CM | POA: Diagnosis not present

## 2015-04-07 DIAGNOSIS — D1801 Hemangioma of skin and subcutaneous tissue: Secondary | ICD-10-CM | POA: Diagnosis not present

## 2015-04-07 DIAGNOSIS — L821 Other seborrheic keratosis: Secondary | ICD-10-CM | POA: Diagnosis not present

## 2015-04-07 DIAGNOSIS — B0089 Other herpesviral infection: Secondary | ICD-10-CM | POA: Diagnosis not present

## 2015-04-07 DIAGNOSIS — L814 Other melanin hyperpigmentation: Secondary | ICD-10-CM | POA: Diagnosis not present

## 2015-04-07 DIAGNOSIS — D225 Melanocytic nevi of trunk: Secondary | ICD-10-CM | POA: Diagnosis not present

## 2015-04-07 DIAGNOSIS — K130 Diseases of lips: Secondary | ICD-10-CM | POA: Diagnosis not present

## 2015-04-07 DIAGNOSIS — L570 Actinic keratosis: Secondary | ICD-10-CM | POA: Diagnosis not present

## 2015-04-13 DIAGNOSIS — R2689 Other abnormalities of gait and mobility: Secondary | ICD-10-CM | POA: Diagnosis not present

## 2015-04-13 DIAGNOSIS — M76822 Posterior tibial tendinitis, left leg: Secondary | ICD-10-CM | POA: Diagnosis not present

## 2015-04-13 DIAGNOSIS — M25572 Pain in left ankle and joints of left foot: Secondary | ICD-10-CM | POA: Diagnosis not present

## 2015-04-14 DIAGNOSIS — M25572 Pain in left ankle and joints of left foot: Secondary | ICD-10-CM | POA: Diagnosis not present

## 2015-04-14 DIAGNOSIS — M76822 Posterior tibial tendinitis, left leg: Secondary | ICD-10-CM | POA: Diagnosis not present

## 2015-04-14 DIAGNOSIS — R2689 Other abnormalities of gait and mobility: Secondary | ICD-10-CM | POA: Diagnosis not present

## 2015-04-21 DIAGNOSIS — R2689 Other abnormalities of gait and mobility: Secondary | ICD-10-CM | POA: Diagnosis not present

## 2015-04-21 DIAGNOSIS — M76822 Posterior tibial tendinitis, left leg: Secondary | ICD-10-CM | POA: Diagnosis not present

## 2015-04-21 DIAGNOSIS — M25572 Pain in left ankle and joints of left foot: Secondary | ICD-10-CM | POA: Diagnosis not present

## 2015-04-22 ENCOUNTER — Encounter: Payer: Self-pay | Admitting: Family Medicine

## 2015-04-22 ENCOUNTER — Ambulatory Visit (INDEPENDENT_AMBULATORY_CARE_PROVIDER_SITE_OTHER): Payer: Medicare Other | Admitting: Family Medicine

## 2015-04-22 VITALS — BP 134/80 | HR 98 | Ht 60.0 in | Wt 214.0 lb

## 2015-04-22 DIAGNOSIS — M109 Gout, unspecified: Secondary | ICD-10-CM

## 2015-04-22 DIAGNOSIS — M9908 Segmental and somatic dysfunction of rib cage: Secondary | ICD-10-CM | POA: Diagnosis not present

## 2015-04-22 DIAGNOSIS — M1009 Idiopathic gout, multiple sites: Secondary | ICD-10-CM | POA: Diagnosis not present

## 2015-04-22 DIAGNOSIS — M9903 Segmental and somatic dysfunction of lumbar region: Secondary | ICD-10-CM

## 2015-04-22 DIAGNOSIS — S86112D Strain of other muscle(s) and tendon(s) of posterior muscle group at lower leg level, left leg, subsequent encounter: Secondary | ICD-10-CM

## 2015-04-22 DIAGNOSIS — G8929 Other chronic pain: Secondary | ICD-10-CM

## 2015-04-22 DIAGNOSIS — M9901 Segmental and somatic dysfunction of cervical region: Secondary | ICD-10-CM

## 2015-04-22 DIAGNOSIS — S96812D Strain of other specified muscles and tendons at ankle and foot level, left foot, subsequent encounter: Secondary | ICD-10-CM

## 2015-04-22 DIAGNOSIS — M999 Biomechanical lesion, unspecified: Secondary | ICD-10-CM

## 2015-04-22 NOTE — Assessment & Plan Note (Signed)
Seen specialist at this time. Discussed with her boot first no boot does not make any significant changes at this time. Encourage her to continue with the formal physical therapy. Patient will come back and see me again in 4-6 weeks for other problems.

## 2015-04-22 NOTE — Assessment & Plan Note (Signed)
Continue allopurinol 

## 2015-04-22 NOTE — Assessment & Plan Note (Signed)
Significant chronic pain. I do think that an Ultrex on could be beneficial. We discussed once again about a seronegative rheumatoid arthritis that's likely also contribute in. Patient will try to get back on the diet when she was not feeling quite some tight. We discussed icing regimen. I do not feel that any other workup would be beneficial at this time. Patient will come back and see me again in 4-6 weeks for further evaluation and treatment.

## 2015-04-22 NOTE — Progress Notes (Signed)
CC: Follow  Up for chronic pain f/u Follow-up for left-sided navicular stress fracture  Patient is also here for a manipulation. Patient does have chronic pain syndrome. Possibility for seronegative rheumatoid arthritis as well. Patient is seen another provider and will be put on naltrexone to see if this can cause some improvement. Patient still avoid any type of disease modifying agents at this time. Patient states though that she has had significant more soreness. Likely compensating for her ankles. Patient is having more back and neck tightness. Has been going to formal physical therapy for her ankles. Patient is no longer also doing her diet and she is gaining weight and has noticed significant more muscle pain and injuries.   Patient was continuing to have unfortunately ankle pain. Have a posterior tibialis tear. Patient has seen a Psychologist, sport and exercise and they are trying to do some mild rehabilitation and then likely will be scheduling surgery in the next month. In use to have significant pain. Notices no difference with wearing the boot her shoes. States that the swelling has improved.   Past Medical History  Diagnosis Date  . Allergic rhinitis, cause unspecified   . Chronic pain   . GERD (gastroesophageal reflux disease)   . HTN (hypertension)   . Sleep apnea   . Abnormal uterine bleeding   . Dyspareunia   . Fibroid   . Urinary incontinence   . Kidney stones   . Bursitis of left shoulder    Past Surgical History  Procedure Laterality Date  . Umbilical hernia repair  2010  . Carpal tunnel release  1998  . Dental implants  2010  . Esophageal dilation  2010  . Hammer toe surgery      rt  . Septoplasty    . Rotator cuff repair Right 09/2011    Dr. Tamera Punt  . Pelvic laparoscopy  1990  .  dilatation and curettage  2000  . Rotator cuff repair Left 11/11/13    Dr. Tamera Punt    Social History  Substance Use Topics  . Smoking status: Never Smoker   . Smokeless tobacco: Never Used  .  Alcohol Use: No   Allergies  Allergen Reactions  . Cefixime Swelling  . Celebrex [Celecoxib] Swelling  . Levofloxacin Other (See Comments)    Muscle and joint pain  . Other Other (See Comments)    Other Reaction: painful joints TEQUIN. TEQUIN - JOINT AND MUSCLE PAIN  . Penicillins Hives  . Robaxin [Methocarbamol]   . Tomato Hives  . Adhesive [Tape] Rash    Sensitive to EKG leads  . Tetracyclines & Related Rash   Family History  Problem Relation Age of Onset  . Rheumatologic disease Mother   . Hypertension Mother   . Hypertension Father   . Rheumatologic disease Sister   . Arthritis Sister   . Ovarian cancer Sister 79     Blood pressure 134/80, pulse 98, height 5' (1.524 m), weight 214 lb (97.07 kg), last menstrual period 01/16/2002, SpO2 97 %. General: No apparent distress alert and oriented x3 mood and affect normal appears ill.  Respiratory: Patient's recent full sentences and does not appear short of breath significant congestion and cough noted + pain to percussion over bilateral frontal sinuses.  Skin: Warm dry intact with no signs of infection or rash  Neuro: Cranial nerves II through XII are intact, neurovascularly intact in all extremities with 2+ DTRs and 2+ pulses.  Antalgic gait secondary to patient being in the [place] Back Exam:  Inspection: Unremarkable  Motion: Flexion 35 deg, Extension 15 deg, Side Bending to 15 deg bilaterally,  Rotation to 25 degrees with increasing tightness SLR laying: Negative  XSLR laying: Negative  Worsening tightness and diffuse tenderness in the paraspinal musculature of the thoracolumbar spine. FABER:Positive bilaterally Sensory change: Gross sensation intact to all lumbar and sacral dermatomes.  Reflexes: 2+ at both patellar tendons, 2+ at achilles tendons, Babinski's downgoing.  Strength at foot  Plantar-flexion: 5/5 Dorsi-flexion: 5/5 Eversion: 5/5 Inversion: 5/5  Ankle exam still shows severe tenderness to palpation at  the insertion of the posterior tibialis. .  OMT findings  Cervical C2 flexed rotated and side bent left C7 extended rotated and side bent left  thoracic  T1 E RS right  T5 extended rotated and side bent  right  Lumbar L4 flexed rotated and side bent right  Sacrum Left on left

## 2015-04-22 NOTE — Patient Instructions (Addendum)
Good to see you  Continue with PT Dr. Clyde Canterbury should be great.  3rd rib on the left was out and posterior illium on right I like some of the exercises for your back On wall with heels, butt shoulder and head touching for a goal of 5 minutes daily  Tennis ball between shoulder blades with driving Keep monitor and TV  At eye level as much as possible K2 is fine for 1 month Try the natroxalone  COntinue the same dose of vitamin D You were tight and possibly watch the diet a little bit See me gaain in 4-6 weeks or before surgery

## 2015-04-22 NOTE — Assessment & Plan Note (Signed)
Decision today to treat with OMT was based on Physical Exam  After verbal consent patient was treated with , ME, soft tissue techniques in cervical thoracic, lumbar and sacral areas  Patient tolerated the procedure well with improvement in symptoms  Patient given exercises, stretches and lifestyle modifications  See medications in patient instructions if given  Patient will follow up in 4-6  weeks                           

## 2015-04-22 NOTE — Progress Notes (Signed)
Pre visit review using our clinic review tool, if applicable. No additional management support is needed unless otherwise documented below in the visit note. 

## 2015-04-23 DIAGNOSIS — M25572 Pain in left ankle and joints of left foot: Secondary | ICD-10-CM | POA: Diagnosis not present

## 2015-04-23 DIAGNOSIS — R2689 Other abnormalities of gait and mobility: Secondary | ICD-10-CM | POA: Diagnosis not present

## 2015-04-23 DIAGNOSIS — M76822 Posterior tibial tendinitis, left leg: Secondary | ICD-10-CM | POA: Diagnosis not present

## 2015-04-26 ENCOUNTER — Telehealth: Payer: Self-pay | Admitting: Pulmonary Disease

## 2015-04-26 DIAGNOSIS — G4733 Obstructive sleep apnea (adult) (pediatric): Secondary | ICD-10-CM

## 2015-04-26 DIAGNOSIS — M25572 Pain in left ankle and joints of left foot: Secondary | ICD-10-CM | POA: Diagnosis not present

## 2015-04-26 DIAGNOSIS — R2689 Other abnormalities of gait and mobility: Secondary | ICD-10-CM | POA: Diagnosis not present

## 2015-04-26 DIAGNOSIS — M76822 Posterior tibial tendinitis, left leg: Secondary | ICD-10-CM | POA: Diagnosis not present

## 2015-04-26 NOTE — Telephone Encounter (Signed)
Auto CPAP 03/09/15 to 04/07/15 >> used on 30 of 30 nights with average 4 hrs 33 min.  Average AHI 0.2 with CPAP 5 to 15 cm H2O.  Will have my nurse inform pt that CPAP report shows good control of sleep apnea.

## 2015-04-26 NOTE — Telephone Encounter (Signed)
Results have been explained to patient, pt expressed understanding.  Pt states that this download was from her old machine. Patient requests that we get a download from her current new machine and see if the data is good.  Order placed to [facility] to link patient in [place] with new machine. Pt aware that once we are able to view her modem online, Dr Halford Chessman will view her data and we will call her with the results. Pt states that with the new machine she seems to be tolerating it better and not having any episodes like she was for the previous machine.  Will send to Dr Halford Chessman as Juluis Rainier and will also hold in my box to follow up on [place].

## 2015-04-28 NOTE — Telephone Encounter (Signed)
Noted  

## 2015-04-29 DIAGNOSIS — M25572 Pain in left ankle and joints of left foot: Secondary | ICD-10-CM | POA: Diagnosis not present

## 2015-04-29 DIAGNOSIS — M76822 Posterior tibial tendinitis, left leg: Secondary | ICD-10-CM | POA: Diagnosis not present

## 2015-04-29 DIAGNOSIS — R2689 Other abnormalities of gait and mobility: Secondary | ICD-10-CM | POA: Diagnosis not present

## 2015-05-03 DIAGNOSIS — Z881 Allergy status to other antibiotic agents status: Secondary | ICD-10-CM | POA: Diagnosis not present

## 2015-05-03 DIAGNOSIS — Z886 Allergy status to analgesic agent status: Secondary | ICD-10-CM | POA: Diagnosis not present

## 2015-05-03 DIAGNOSIS — Z79899 Other long term (current) drug therapy: Secondary | ICD-10-CM | POA: Diagnosis not present

## 2015-05-03 DIAGNOSIS — M76822 Posterior tibial tendinitis, left leg: Secondary | ICD-10-CM | POA: Diagnosis not present

## 2015-05-03 DIAGNOSIS — M2140 Flat foot [pes planus] (acquired), unspecified foot: Secondary | ICD-10-CM | POA: Diagnosis not present

## 2015-05-03 DIAGNOSIS — Z888 Allergy status to other drugs, medicaments and biological substances status: Secondary | ICD-10-CM | POA: Diagnosis not present

## 2015-05-03 DIAGNOSIS — Z880 Allergy status to penicillin: Secondary | ICD-10-CM | POA: Diagnosis not present

## 2015-05-04 ENCOUNTER — Encounter: Payer: Self-pay | Admitting: Family Medicine

## 2015-05-04 ENCOUNTER — Ambulatory Visit (INDEPENDENT_AMBULATORY_CARE_PROVIDER_SITE_OTHER): Payer: Medicare Other | Admitting: Family Medicine

## 2015-05-04 ENCOUNTER — Ambulatory Visit (INDEPENDENT_AMBULATORY_CARE_PROVIDER_SITE_OTHER)
Admission: RE | Admit: 2015-05-04 | Discharge: 2015-05-04 | Disposition: A | Discharge status: Home / Self Care | Payer: Medicare Other | Source: Ambulatory Visit | Attending: Family Medicine | Admitting: Family Medicine

## 2015-05-04 VITALS — BP 142/84 | HR 91 | Ht 60.0 in | Wt 216.0 lb

## 2015-05-04 DIAGNOSIS — G8929 Other chronic pain: Secondary | ICD-10-CM | POA: Diagnosis not present

## 2015-05-04 DIAGNOSIS — M545 Low back pain, unspecified: Secondary | ICD-10-CM

## 2015-05-04 DIAGNOSIS — M1711 Unilateral primary osteoarthritis, right knee: Secondary | ICD-10-CM

## 2015-05-04 DIAGNOSIS — M999 Biomechanical lesion, unspecified: Secondary | ICD-10-CM

## 2015-05-04 DIAGNOSIS — M9908 Segmental and somatic dysfunction of rib cage: Secondary | ICD-10-CM

## 2015-05-04 DIAGNOSIS — M9901 Segmental and somatic dysfunction of cervical region: Secondary | ICD-10-CM | POA: Diagnosis not present

## 2015-05-04 DIAGNOSIS — M5136 Other intervertebral disc degeneration, lumbar region: Secondary | ICD-10-CM | POA: Diagnosis not present

## 2015-05-04 DIAGNOSIS — S96812D Strain of other specified muscles and tendons at ankle and foot level, left foot, subsequent encounter: Secondary | ICD-10-CM | POA: Diagnosis not present

## 2015-05-04 DIAGNOSIS — M25551 Pain in right hip: Secondary | ICD-10-CM

## 2015-05-04 DIAGNOSIS — S86112D Strain of other muscle(s) and tendon(s) of posterior muscle group at lower leg level, left leg, subsequent encounter: Secondary | ICD-10-CM

## 2015-05-04 DIAGNOSIS — M9903 Segmental and somatic dysfunction of lumbar region: Secondary | ICD-10-CM | POA: Diagnosis not present

## 2015-05-04 MED ORDER — GABAPENTIN 100 MG PO CAPS: 200.0000 mg | ORAL_CAPSULE | Freq: Every day | ORAL | Status: DC

## 2015-05-04 NOTE — Progress Notes (Signed)
CC: Follow  Up for chronic pain f/u Follow-up for left-sided navicular stress fracture  Patient is also here for a manipulation. Patient does have chronic pain syndrome. Possibility for seronegative rheumatoid arthritis as well. Continues to have discomfort overall. Pain everywhere. Not getting much better. Continues of vitamins. Seen and integrative physician and is trying different treatment options. Patient has been recently started on naltrexone. No side effects. No significant improvement.  Worsening right knee pain. Has degenerative arthritic changes of the knee. Wondering what else can be done. Has side effects to the prednisone. Does not want surgical intervention such as replacement at this time.  Patient was continuing to have unfortunately ankle pain. Have a posterior tibialis tear. Patient has seen another provider and is being fixed for custom orthotics that she will try for one-month before they tried any surgical intervention.  Patient is also having more of right hip pain. Seems to be mostly posterior but can radiate towards her hip anterior towards the groin. Patient states that he can even go up towards her abdomen. Sometimes difficulty to even bear weight. Sometimes difficult at night. Some pain in the back and some mild weakness she feels like from the right lower extremity.   Past Medical History  Diagnosis Date  . Allergic rhinitis, cause unspecified   . Chronic pain   . GERD (gastroesophageal reflux disease)   . HTN (hypertension)   . Sleep apnea   . Abnormal uterine bleeding   . Dyspareunia   . Fibroid   . Urinary incontinence   . Kidney stones   . Bursitis of left shoulder    Past Surgical History  Procedure Laterality Date  . Umbilical hernia repair  2010  . Carpal tunnel release  1998  . Dental implants  2010  . Esophageal dilation  2010  . Hammer toe surgery      rt  . Septoplasty    . Rotator cuff repair Right 09/2011    Dr. Tamera Punt  . Pelvic  laparoscopy  1990  .  dilatation and curettage  2000  . Rotator cuff repair Left 11/11/13    Dr. Tamera Punt    Social History  Substance Use Topics  . Smoking status: Never Smoker   . Smokeless tobacco: Never Used  . Alcohol Use: No   Allergies  Allergen Reactions  . Cefixime Swelling  . Celebrex [Celecoxib] Swelling  . Levofloxacin Other (See Comments)    Muscle and joint pain  . Other Other (See Comments)    Other Reaction: painful joints TEQUIN. TEQUIN - JOINT AND MUSCLE PAIN  . Penicillins Hives  . Robaxin [Methocarbamol]   . Tomato Hives  . Adhesive [Tape] Rash    Sensitive to EKG leads  . Tetracyclines & Related Rash   Family History  Problem Relation Age of Onset  . Rheumatologic disease Mother   . Hypertension Mother   . Hypertension Father   . Rheumatologic disease Sister   . Arthritis Sister   . Ovarian cancer Sister 31     Last menstrual period 01/16/2002. General: No apparent distress alert and oriented x3 mood and affect normal appears ill.  Respiratory: Patient's recent full sentences and does not appear short of breath significant congestion and cough noted + pain to percussion over bilateral frontal sinuses.  Skin: Warm dry intact with no signs of infection or rash  Neuro: Cranial nerves II through XII are intact, neurovascularly intact in all extremities with 2+ DTRs and 2+ pulses.  Antalgic gait  Knee  exam shows the patient does have severe tenderness over the medial aspect. Patient does have significant instability with valgus deformity. He relates with an extended knee most of the time. Back Exam:  Inspection: Unremarkable  Motion: Flexion 35 deg, Extension 15 deg, Side Bending to 15 deg bilaterally,  Rotation to 25 degrees with increasing tightness SLR laying: positive straight leg test on the right side XSLR laying: Negative  Worsening tightness and diffuse tenderness in the paraspinal musculature of the thoracolumbar spine.patient also has  severe tenderness over the ischial tuberosity on the right side. FABER:Positive bilaterally Sensory change: Gross sensation intact to all lumbar and sacral dermatomes.  Reflexes: 2+ at both patellar tendons, 2+ at achilles tendons, Babinski's downgoing.  Strength at foot  4-5 strength of the right lower extremity compared to the left side Ankle exam still shows severe tenderness to palpation at the insertion of the posterior tibialis. .  OMT findings  Cervical C2 flexed rotated and side bent left C7 extended rotated and side bent left  thoracic  T1 E RS right  T5 extended rotated and side bent  right  Lumbar L4 flexed rotated and side bent right  Sacrum Left on left

## 2015-05-04 NOTE — Patient Instructions (Signed)
Good to see you  I like the idea of the orthotics Ask insurance about monovisc or orthovisc Codes are 17.0, M17.9 for diagnosis Medication code is J7325 and [phone] If they will cover then call and we will work you in for an injection Vivi Ferns I am pretty sure does MYK and give him a call [phone] Gabapentin 100mg  at night for first week then 200mg  thereafter Xrays downstairs Continue the naltexone See me again in 4 weeks for manipulation  30 minute appointment please

## 2015-05-04 NOTE — Assessment & Plan Note (Signed)
Decision today to treat with OMT was based on Physical Exam  After verbal consent patient was treated with , ME, soft tissue techniques in cervical thoracic, lumbar and sacral areas  Patient tolerated the procedure well with improvement in symptoms  Patient given exercises, stretches and lifestyle modifications  See medications in patient instructions if given  Patient will follow up in 4-6  weeks                           

## 2015-05-04 NOTE — Progress Notes (Signed)
Pre visit review using our clinic review tool, if applicable. No additional management support is needed unless otherwise documented below in the visit note. 

## 2015-05-04 NOTE — Assessment & Plan Note (Signed)
Continue to follow-up with orthopedic specialist.

## 2015-05-04 NOTE — Telephone Encounter (Signed)
Pt has received new CPAP machine and is has been using it since 04/09/15. New D/L printed for Dr Halford Chessman to review. Placed in VS look at. Please advise Dr Halford Chessman. Thanks.

## 2015-05-04 NOTE — Assessment & Plan Note (Signed)
Continues respond fairly well to osteopathic manipulation. We discussed icing regimen. Patient is likely a seronegative rheumatoid arthritis is also contribute in. We'll continue to monitor. Patient will continue the other medications that she is getting from outside facilities as well to see if this will be beneficial. Encourage her to continue the vitamins. Follow-up in 4-6 weeks.

## 2015-05-04 NOTE — Assessment & Plan Note (Signed)
Discussed with patient about possible viscous supplementation. Patient will check with insurance. Has failed all other conservative therapies except the steroid injection. Patient at this point would not want a joint replacement and I do not think that she would qualify for any type for arthroscopic procedure. Patient can do the viscous supplementation she will make an appointment and we will start. Has had difficulty with the custom brace previously.

## 2015-05-04 NOTE — Assessment & Plan Note (Signed)
Concern that patient's pain in the ischial tuberosity and the weakness of the lower extremities secondary to more of a lumbar radiculopathy. Started on gabapentin. Patient had side effects to the prednisone. We discussed icing regimen. Discussed with patient to monitor. X-rays ordered and are pending at this time. If worsening symptoms patient notes to seek medical attention immediately. When patient follows up again if worsening symptoms possible advance imaging would be warranted. Possible injection in the ischial tuberosity can be also beneficial.

## 2015-05-05 DIAGNOSIS — R2689 Other abnormalities of gait and mobility: Secondary | ICD-10-CM | POA: Diagnosis not present

## 2015-05-05 DIAGNOSIS — M25572 Pain in left ankle and joints of left foot: Secondary | ICD-10-CM | POA: Diagnosis not present

## 2015-05-05 DIAGNOSIS — M76822 Posterior tibial tendinitis, left leg: Secondary | ICD-10-CM | POA: Diagnosis not present

## 2015-05-06 NOTE — Telephone Encounter (Signed)
Auto CPAP 04/09/15 to 05/03/15 >> used on 25 of 25 nights with average 4 hrs 32 min.  Average AHI 0.2 with median CPAP 7 and 95 th percentile CPAP 9 cm H2O   Will have my nurse inform that CPAP report from new machine looks good.  She should use CPAP whenever she is asleep to get maximal benefit.

## 2015-05-09 ENCOUNTER — Encounter: Payer: Self-pay | Admitting: Family Medicine

## 2015-05-10 ENCOUNTER — Telehealth: Payer: Self-pay | Admitting: Family Medicine

## 2015-05-10 NOTE — Telephone Encounter (Signed)
Patient called stating Dr. Tamala Julian was to work her in this Friday for a knee injection.  She would like to know what time she should come.   Patient prefers a later morning appt.

## 2015-05-10 NOTE — Telephone Encounter (Signed)
Tell her ok  At 1130 Only injection though, double booked at  [facility].  Thank you

## 2015-05-10 NOTE — Telephone Encounter (Signed)
Noted  

## 2015-05-10 NOTE — Telephone Encounter (Signed)
Pt made aware

## 2015-05-10 NOTE — Telephone Encounter (Signed)
Results have been explained to patient, pt expressed understanding. Pt states that her compliance level (hrs of sleep) is about to increase - recent back problems are resolving- which interfered with her sleeping the last few weeks. Nothing further needed.

## 2015-05-14 ENCOUNTER — Ambulatory Visit (INDEPENDENT_AMBULATORY_CARE_PROVIDER_SITE_OTHER): Payer: Medicare Other | Admitting: Family Medicine

## 2015-05-14 ENCOUNTER — Encounter: Payer: Self-pay | Admitting: Family Medicine

## 2015-05-14 VITALS — BP 128/82 | HR 78 | Wt 215.0 lb

## 2015-05-14 DIAGNOSIS — M1711 Unilateral primary osteoarthritis, right knee: Secondary | ICD-10-CM

## 2015-05-14 NOTE — Patient Instructions (Signed)
Good to  See you as always

## 2015-05-14 NOTE — Progress Notes (Signed)
CC: Follow  Up right knee pain    Worsening right knee pain. Has degenerative arthritic changes of the knee. Patient has bone-on-bone arthritis. We'll still avoid any type of surgical intervention. Patient has failed all other conservative therapy including formal physical therapy, bracing, home exercises icing as well as topical and oral anti-inflammatories.     Past Medical History  Diagnosis Date  . Allergic rhinitis, cause unspecified   . Chronic pain   . GERD (gastroesophageal reflux disease)   . HTN (hypertension)   . Sleep apnea   . Abnormal uterine bleeding   . Dyspareunia   . Fibroid   . Urinary incontinence   . Kidney stones   . Bursitis of left shoulder    Past Surgical History  Procedure Laterality Date  . Umbilical hernia repair  2010  . Carpal tunnel release  1998  . Dental implants  2010  . Esophageal dilation  2010  . Hammer toe surgery      rt  . Septoplasty    . Rotator cuff repair Right 09/2011    Dr. Tamera Punt  . Pelvic laparoscopy  1990  .  dilatation and curettage  2000  . Rotator cuff repair Left 11/11/13    Dr. Tamera Punt    Social History  Substance Use Topics  . Smoking status: Never Smoker   . Smokeless tobacco: Never Used  . Alcohol Use: No   Allergies  Allergen Reactions  . Cefixime Swelling  . Celebrex [Celecoxib] Swelling  . Levofloxacin Other (See Comments)    Muscle and joint pain  . Other Other (See Comments)    Other Reaction: painful joints TEQUIN. TEQUIN - JOINT AND MUSCLE PAIN  . Penicillins Hives  . Robaxin [Methocarbamol]   . Tomato Hives  . Adhesive [Tape] Rash    Sensitive to EKG leads  . Tetracyclines & Related Rash   Family History  Problem Relation Age of Onset  . Rheumatologic disease Mother   . Hypertension Mother   . Hypertension Father   . Rheumatologic disease Sister   . Arthritis Sister   . Ovarian cancer Sister 80     Last menstrual period 01/16/2002. General: No apparent distress alert and  oriented x3 mood and affect normal appears ill.  Respiratory: Patient's recent full sentences and does not appear short of breath significant congestion and cough noted + pain to percussion over bilateral frontal sinuses.  Skin: Warm dry intact with no signs of infection or rash  Neuro: Cranial nerves II through XII are intact, neurovascularly intact in all extremities with 2+ DTRs and 2+ pulses.  Antalgic gait  Knee exam shows the patient does have severe tenderness over the medial aspect. Patient does have significant instability with valgus deformity. Lacking the last 5 of extension  After informed written and verbal consent, patient was seated on exam table. Right knee was prepped with alcohol swab and utilizing anterolateral approach, patient's right knee space was injected with 15 mg/2.5 mL of Orthovisc (sodium hyaluronate) in a prefilled syringe was injected easily into the knee through a 22-gauge needle.. Patient tolerated the procedure well without immediate complications. Marland Kitchen

## 2015-05-14 NOTE — Assessment & Plan Note (Signed)
Starting viscous supplementation daily. Tolerated the procedure well. We discussed icing regimen. Secondary to patient's body habitus We should use ultrasound guidance. Patient will continue with conservative therapy. We will see her back again in 1 week for second in a series of 4 injections.

## 2015-05-18 ENCOUNTER — Encounter: Payer: Self-pay | Admitting: Family Medicine

## 2015-05-20 ENCOUNTER — Ambulatory Visit: Payer: Medicare Other | Admitting: Family Medicine

## 2015-05-20 ENCOUNTER — Ambulatory Visit: Payer: Self-pay | Admitting: Family Medicine

## 2015-05-21 DIAGNOSIS — E559 Vitamin D deficiency, unspecified: Secondary | ICD-10-CM | POA: Diagnosis not present

## 2015-05-21 DIAGNOSIS — G4733 Obstructive sleep apnea (adult) (pediatric): Secondary | ICD-10-CM | POA: Diagnosis not present

## 2015-05-21 DIAGNOSIS — R7309 Other abnormal glucose: Secondary | ICD-10-CM | POA: Diagnosis not present

## 2015-05-27 ENCOUNTER — Ambulatory Visit (INDEPENDENT_AMBULATORY_CARE_PROVIDER_SITE_OTHER): Payer: Medicare Other | Admitting: Family Medicine

## 2015-05-27 ENCOUNTER — Ambulatory Visit: Payer: Self-pay | Admitting: Family Medicine

## 2015-05-27 ENCOUNTER — Encounter: Payer: Self-pay | Admitting: Family Medicine

## 2015-05-27 VITALS — BP 126/76 | HR 94 | Wt 215.0 lb

## 2015-05-27 DIAGNOSIS — M1711 Unilateral primary osteoarthritis, right knee: Secondary | ICD-10-CM | POA: Diagnosis not present

## 2015-05-27 NOTE — Patient Instructions (Addendum)
Good to see you  This is #2  Increase gabapentin to 300mg  and if better at next appointment I will get you a new prescription.  Love the back you are doing better.  See you soon.

## 2015-05-27 NOTE — Progress Notes (Signed)
CC: Follow  Up right knee pain    Worsening right knee pain. Has degenerative arthritic changes of the knee. Patient has bone-on-bone arthritis. We'll still avoid any type of surgical intervention. Patient has failed all other conservative therapy including formal physical therapy, bracing, home exercises icing as well as topical and oral anti-inflammatories. Patient is here for a second in a series of 4 injections viscous supplementation. No significant improvement from previous exam she states.      Past Medical History  Diagnosis Date  . Allergic rhinitis, cause unspecified   . Chronic pain   . GERD (gastroesophageal reflux disease)   . HTN (hypertension)   . Sleep apnea   . Abnormal uterine bleeding   . Dyspareunia   . Fibroid   . Urinary incontinence   . Kidney stones   . Bursitis of left shoulder    Past Surgical History  Procedure Laterality Date  . Umbilical hernia repair  2010  . Carpal tunnel release  1998  . Dental implants  2010  . Esophageal dilation  2010  . Hammer toe surgery      rt  . Septoplasty    . Rotator cuff repair Right 09/2011    Dr. Tamera Punt  . Pelvic laparoscopy  1990  .  dilatation and curettage  2000  . Rotator cuff repair Left 11/11/13    Dr. Tamera Punt    Social History  Substance Use Topics  . Smoking status: Never Smoker   . Smokeless tobacco: Never Used  . Alcohol Use: No   Allergies  Allergen Reactions  . Cefixime Swelling  . Celebrex [Celecoxib] Swelling  . Levofloxacin Other (See Comments)    Muscle and joint pain  . Other Other (See Comments)    Other Reaction: painful joints TEQUIN. TEQUIN - JOINT AND MUSCLE PAIN  . Penicillins Hives  . Robaxin [Methocarbamol]   . Tomato Hives  . Adhesive [Tape] Rash    Sensitive to EKG leads  . Tetracyclines & Related Rash   Family History  Problem Relation Age of Onset  . Rheumatologic disease Mother   . Hypertension Mother   . Hypertension Father   . Rheumatologic disease Sister    . Arthritis Sister   . Ovarian cancer Sister 25     Blood pressure 126/76, pulse 94, weight 215 lb (97.523 kg), last menstrual period 01/16/2002. General: No apparent distress alert and oriented x3 mood and affect normal appears ill.  Respiratory: Patient's recent full sentences and does not appear short of breath significant congestion and cough noted + pain to percussion over bilateral frontal sinuses.  Skin: Warm dry intact with no signs of infection or rash  Neuro: Cranial nerves II through XII are intact, neurovascularly intact in all extremities with 2+ DTRs and 2+ pulses.  Antalgic gait  Knee exam shows the patient does have severe tenderness over the medial aspect. Patient does have significant instability with valgus deformity. Lacking the last 5 of extension No change from previous exam  After informed written and verbal consent, patient was seated on exam table. Right knee was prepped with alcohol swab and utilizing anterolateral approach, patient's right knee space was injected with 15 mg/2.5 mL of Orthovisc (sodium hyaluronate) in a prefilled syringe was injected easily into the knee through a 22-gauge needle.. Patient tolerated the procedure well without immediate complications. Marland Kitchen

## 2015-05-27 NOTE — Assessment & Plan Note (Signed)
Patient given second in a series of 4 injections today. Tolerated the procedure well. We discussed icing regimen. Continue with the conservative therapy. Patient is being fitted for custom shoes today she states. We'll see patient back again in 1 week for third in a series of 4 injections.

## 2015-06-03 ENCOUNTER — Encounter: Payer: Self-pay | Admitting: Family Medicine

## 2015-06-03 ENCOUNTER — Ambulatory Visit: Payer: Medicare Other | Admitting: Family Medicine

## 2015-06-08 ENCOUNTER — Encounter: Payer: Self-pay | Admitting: Family Medicine

## 2015-06-08 ENCOUNTER — Ambulatory Visit (INDEPENDENT_AMBULATORY_CARE_PROVIDER_SITE_OTHER): Payer: Medicare Other | Admitting: Family Medicine

## 2015-06-08 VITALS — BP 140/92 | HR 96

## 2015-06-08 DIAGNOSIS — M1711 Unilateral primary osteoarthritis, right knee: Secondary | ICD-10-CM | POA: Diagnosis not present

## 2015-06-08 NOTE — Patient Instructions (Addendum)
Good to see you  #3 done Keep doing what you are doing Either keep gabapentin 100mg   Or consider even 3 times a week.  See me again in a week for the finale!

## 2015-06-08 NOTE — Progress Notes (Signed)
CC: Follow  Up right knee pain   Worsening right knee pain. Has degenerative arthritic changes of the knee. Patient has bone-on-bone arthritis. We'll still avoid any type of surgical intervention. Patient has failed all other conservative therapy including formal physical therapy, bracing, home exercises icing as well as topical and oral anti-inflammatories. Patient is here for a  3rd in a series of 4 injections viscous supplementation. Mild improvement with the last injection.      Past Medical History  Diagnosis Date  . Allergic rhinitis, cause unspecified   . Chronic pain   . GERD (gastroesophageal reflux disease)   . HTN (hypertension)   . Sleep apnea   . Abnormal uterine bleeding   . Dyspareunia   . Fibroid   . Urinary incontinence   . Kidney stones   . Bursitis of left shoulder    Past Surgical History  Procedure Laterality Date  . Umbilical hernia repair  2010  . Carpal tunnel release  1998  . Dental implants  2010  . Esophageal dilation  2010  . Hammer toe surgery      rt  . Septoplasty    . Rotator cuff repair Right 09/2011    Dr. Tamera Punt  . Pelvic laparoscopy  1990  .  dilatation and curettage  2000  . Rotator cuff repair Left 11/11/13    Dr. Tamera Punt    Social History  Substance Use Topics  . Smoking status: Never Smoker   . Smokeless tobacco: Never Used  . Alcohol Use: No   Allergies  Allergen Reactions  . Cefixime Swelling  . Celebrex [Celecoxib] Swelling  . Levofloxacin Other (See Comments)    Muscle and joint pain  . Other Other (See Comments)    Other Reaction: painful joints TEQUIN. TEQUIN - JOINT AND MUSCLE PAIN  . Penicillins Hives  . Robaxin [Methocarbamol]   . Tomato Hives  . Adhesive [Tape] Rash    Sensitive to EKG leads  . Tetracyclines & Related Rash   Family History  Problem Relation Age of Onset  . Rheumatologic disease Mother   . Hypertension Mother   . Hypertension Father   . Rheumatologic disease Sister   . Arthritis  Sister   . Ovarian cancer Sister 3     Blood pressure 140/92, pulse 96, last menstrual period 01/16/2002, SpO2 98 %. General: No apparent distress alert and oriented x3 mood and affect normal appears ill.  Respiratory: Patient's recent full sentences and does not appear short of breath significant congestion and cough noted + pain to percussion over bilateral frontal sinuses.  Skin: Warm dry intact with no signs of infection or rash  Neuro: Cranial nerves II through XII are intact, neurovascularly intact in all extremities with 2+ DTRs and 2+ pulses.  Antalgic gait  Knee exam shows the patient does have severe tenderness over the medial aspect. Patient does have significant instability with valgus deformity. Lacking the last 5 of extension No change from previous exam  After informed written and verbal consent, patient was seated on exam table. Right knee was prepped with alcohol swab and utilizing anterolateral approach, patient's right knee space was injected with 15 mg/2.5 mL of Orthovisc (sodium hyaluronate) in a prefilled syringe was injected easily into the knee through a 22-gauge needle.. Patient tolerated the procedure well without immediate complications. Marland Kitchen

## 2015-06-08 NOTE — Assessment & Plan Note (Signed)
Third in a series of 4 injections given today. Had some difficulty and will use ultrasound objects visit. Patient come back and see me again in 1 week for fourth and final injection.

## 2015-06-08 NOTE — Progress Notes (Signed)
Pre visit review using our clinic review tool, if applicable. No additional management support is needed unless otherwise documented below in the visit note. 

## 2015-06-17 ENCOUNTER — Encounter: Payer: Self-pay | Admitting: Family Medicine

## 2015-06-17 ENCOUNTER — Ambulatory Visit (INDEPENDENT_AMBULATORY_CARE_PROVIDER_SITE_OTHER): Payer: Medicare Other | Admitting: Family Medicine

## 2015-06-17 ENCOUNTER — Other Ambulatory Visit: Payer: Self-pay

## 2015-06-17 VITALS — BP 170/88 | HR 86 | Ht 60.0 in | Wt 220.0 lb

## 2015-06-17 DIAGNOSIS — M1711 Unilateral primary osteoarthritis, right knee: Secondary | ICD-10-CM | POA: Diagnosis not present

## 2015-06-17 DIAGNOSIS — M25561 Pain in right knee: Secondary | ICD-10-CM

## 2015-06-17 NOTE — Progress Notes (Signed)
Pre visit review using our clinic review tool, if applicable. No additional management support is needed unless otherwise documented below in the visit note. 

## 2015-06-17 NOTE — Assessment & Plan Note (Signed)
Given fourth series of 4 injections today. Patient's will see if she has any response. If not unfortunately patient last resort would be a knee replacement. Due to her other comorbidities and multiple allergies I'm concern for possible difficult postsurgical progression. Patient will continue to try to be active. We will see patient back again in 4 weeks for further evaluation and treatment.

## 2015-06-17 NOTE — Progress Notes (Signed)
CC: Follow  Up right knee pain   Worsening right knee pain. Has degenerative arthritic changes of the knee. Patient has bone-on-bone arthritis. We'll still avoid any type of surgical intervention. Patient has failed all other conservative therapy including formal physical therapy, bracing, home exercises icing as well as topical and oral anti-inflammatories. Patient is here for a   4thin a series of 4 injections viscous supplementation. Patient unfortunately states that she is having more pain in this knee. Feels like there is some swelling. Rates the severity of pain a 7 out of 10. Increasing instability as well. Not wearing her custom brace. Had difficulty secondary to patient's body habitus with last injection so we will use ultrasound.   Past Medical History  Diagnosis Date  . Allergic rhinitis, cause unspecified   . Chronic pain   . GERD (gastroesophageal reflux disease)   . HTN (hypertension)   . Sleep apnea   . Abnormal uterine bleeding   . Dyspareunia   . Fibroid   . Urinary incontinence   . Kidney stones   . Bursitis of left shoulder    Past Surgical History  Procedure Laterality Date  . Umbilical hernia repair  2010  . Carpal tunnel release  1998  . Dental implants  2010  . Esophageal dilation  2010  . Hammer toe surgery      rt  . Septoplasty    . Rotator cuff repair Right 09/2011    Dr. Tamera Punt  . Pelvic laparoscopy  1990  .  dilatation and curettage  2000  . Rotator cuff repair Left 11/11/13    Dr. Tamera Punt    Social History  Substance Use Topics  . Smoking status: Never Smoker   . Smokeless tobacco: Never Used  . Alcohol Use: No   Allergies  Allergen Reactions  . Cefixime Swelling  . Celebrex [Celecoxib] Swelling  . Levofloxacin Other (See Comments)    Muscle and joint pain  . Other Other (See Comments)    Other Reaction: painful joints TEQUIN. TEQUIN - JOINT AND MUSCLE PAIN  . Penicillins Hives  . Robaxin [Methocarbamol]   . Tomato Hives  .  Adhesive [Tape] Rash    Sensitive to EKG leads  . Tetracyclines & Related Rash   Family History  Problem Relation Age of Onset  . Rheumatologic disease Mother   . Hypertension Mother   . Hypertension Father   . Rheumatologic disease Sister   . Arthritis Sister   . Ovarian cancer Sister 62     Blood pressure 170/88, pulse 86, height 5' (1.524 m), weight 220 lb (99.791 kg), last menstrual period 01/16/2002, SpO2 97 %. General: No apparent distress alert and oriented x3 mood and affect normal appears ill.  Respiratory: Patient's recent full sentences and does not appear short of breath significant congestion and cough noted + pain to percussion over bilateral frontal sinuses.  Skin: Warm dry intact with no signs of infection or rash  Neuro: Cranial nerves II through XII are intact, neurovascularly intact in all extremities with 2+ DTRs and 2+ pulses.  Antalgic gait  Knee exam shows the patient does have severe tenderness over the medial aspect with no improvement. Patient does have significant instability with valgus deformity. Lacking the last 5 of extension No change from previous exam  Procedure: Real-time Ultrasound Guided Injection of right knee Device: GE Logiq E  Ultrasound guided injection is preferred based studies that show increased duration, increased effect, greater accuracy, decreased procedural pain, increased response rate, and  decreased cost with ultrasound guided versus blind injection.  Verbal informed consent obtained.  Time-out conducted.  Noted no overlying erythema, induration, or other signs of local infection.  Skin prepped in a sterile fashion.  Local anesthesia: Topical Ethyl chloride.  With sterile technique and under real time ultrasound guidance: With a 22-gauge 2 inch needle patient was injected with 15 mg/2.5 mL of Orthovisc (sodium hyaluronate) in a prefilled syringe was injected easily into the knee through a 22-gauge needle..  Completed without  difficulty  Pain immediately resolved suggesting accurate placement of the medication.  Advised to call if fevers/chills, erythema, induration, drainage, or persistent bleeding.  Images permanently stored and available for review in the ultrasound unit.  Impression: Technically successful ultrasound guided injection.   Marland Kitchen

## 2015-06-17 NOTE — Patient Instructions (Signed)
Good to see you  Kerry [name] is your friend You did it with the knee and hopefully over the next 2 weeks we will see improvement When you get back we will consider watching the blood pressure daily and then send me a message after a week.  If worsening blood pressure we will start to decrease allopurinol or switch it all together.  I think this was anomaly See me otherwise in 4 weeks

## 2015-07-08 ENCOUNTER — Ambulatory Visit: Payer: Medicare Other | Admitting: Pulmonary Disease

## 2015-07-14 ENCOUNTER — Encounter: Payer: Self-pay | Admitting: Family Medicine

## 2015-07-14 ENCOUNTER — Ambulatory Visit (INDEPENDENT_AMBULATORY_CARE_PROVIDER_SITE_OTHER): Payer: Medicare Other | Admitting: Family Medicine

## 2015-07-14 VITALS — BP 140/78 | HR 86 | Ht 60.0 in

## 2015-07-14 DIAGNOSIS — M545 Low back pain, unspecified: Secondary | ICD-10-CM

## 2015-07-14 DIAGNOSIS — M9901 Segmental and somatic dysfunction of cervical region: Secondary | ICD-10-CM

## 2015-07-14 DIAGNOSIS — M1711 Unilateral primary osteoarthritis, right knee: Secondary | ICD-10-CM | POA: Diagnosis not present

## 2015-07-14 DIAGNOSIS — M999 Biomechanical lesion, unspecified: Secondary | ICD-10-CM

## 2015-07-14 DIAGNOSIS — M9903 Segmental and somatic dysfunction of lumbar region: Secondary | ICD-10-CM

## 2015-07-14 DIAGNOSIS — M9908 Segmental and somatic dysfunction of rib cage: Secondary | ICD-10-CM

## 2015-07-14 DIAGNOSIS — G8929 Other chronic pain: Secondary | ICD-10-CM | POA: Diagnosis not present

## 2015-07-14 DIAGNOSIS — S96812D Strain of other specified muscles and tendons at ankle and foot level, left foot, subsequent encounter: Secondary | ICD-10-CM

## 2015-07-14 DIAGNOSIS — S86112D Strain of other muscle(s) and tendon(s) of posterior muscle group at lower leg level, left leg, subsequent encounter: Secondary | ICD-10-CM

## 2015-07-14 NOTE — Assessment & Plan Note (Signed)
Encourage her to follow-up with orthopedic specialist

## 2015-07-14 NOTE — Progress Notes (Signed)
CC: Follow  Up right knee pain   Worsening right knee pain. Has degenerative arthritic changes of the knee. Patient has bone-on-bone arthritis. Patient was given viscous supplementation. Is here for 4 weeks. Has noticed that the swelling has improved. Still having pain on the medial aspect the knee. Still has instability. Does not wear the brace that she had custom-made secondary to it causing some discomfort. Overall some mild improvement but not completely improved.  Patient is also having worsening back pain. Has had arthralgias of multiple joints. Likely a seronegative rheumatoid arthritis. Patient has responded well to osteopathic manipulation previously. Patient has tried weight loss but is having difficulty secondary to not being to be able to be active.  Patient also has a posterior tibialis tear on the left foot. Wearing different orthotics. Following up with the surgeon but not September. Patient will continue with the conservative therapy at this time. Has noticed that the swelling has improved but continues to have pain if she walks any significant distance.  Past Medical History  Diagnosis Date  . Allergic rhinitis, cause unspecified   . Chronic pain   . GERD (gastroesophageal reflux disease)   . HTN (hypertension)   . Sleep apnea   . Abnormal uterine bleeding   . Dyspareunia   . Fibroid   . Urinary incontinence   . Kidney stones   . Bursitis of left shoulder    Past Surgical History  Procedure Laterality Date  . Umbilical hernia repair  2010  . Carpal tunnel release  1998  . Dental implants  2010  . Esophageal dilation  2010  . Hammer toe surgery      rt  . Septoplasty    . Rotator cuff repair Right 09/2011    Dr. Tamera Punt  . Pelvic laparoscopy  1990  .  dilatation and curettage  2000  . Rotator cuff repair Left 11/11/13    Dr. Tamera Punt    Social History  Substance Use Topics  . Smoking status: Never Smoker   . Smokeless tobacco: Never Used  . Alcohol Use: No    Allergies  Allergen Reactions  . Cefixime Swelling  . Celebrex [Celecoxib] Swelling  . Levofloxacin Other (See Comments)    Muscle and joint pain  . Other Other (See Comments)    Other Reaction: painful joints TEQUIN. TEQUIN - JOINT AND MUSCLE PAIN  . Penicillins Hives  . Robaxin [Methocarbamol]   . Tomato Hives  . Adhesive [Tape] Rash    Sensitive to EKG leads  . Tetracyclines & Related Rash   Family History  Problem Relation Age of Onset  . Rheumatologic disease Mother   . Hypertension Mother   . Hypertension Father   . Rheumatologic disease Sister   . Arthritis Sister   . Ovarian cancer Sister 39     Blood pressure 140/78, pulse 86, last menstrual period 01/16/2002. General: No apparent distress alert and oriented x3 mood and affect normal appears ill.  Respiratory: Patient's recent full sentences and does not appear short of breath significant congestion and cough noted + pain to percussion over bilateral frontal sinuses.  Skin: Warm dry intact with no signs of infection or rash  Neuro: Cranial nerves II through XII are intact, neurovascularly intact in all extremities with 2+ DTRs and 2+ pulses.  Antalgic gait  Knee exam shows the patient does have moderate tenderness over the medial aspect with mild improvement. Patient does have significant instability with valgus deformity. Lacking the last 5 of extension  Back Exam:  Inspection: Unremarkable  Motion: Flexion 35 deg, Extension 20 deg, Side Bending to 15 deg bilaterally,  Rotation to 25 degrees with tightness SLR laying: positive straight leg test on the right side XSLR laying: Negative  Continued have some tenderness in the paraspinal musculature of the lumbar spine. FABER:Positive bilaterally Sensory change: Gross sensation intact to all lumbar and sacral dermatomes.  Reflexes: 2+ at both patellar tendons, 2+ at achilles tendons, Babinski's downgoing.  Strength at foot  4-5 strength of the right  lower extremity compared to the left side Ankle exam still shows severe tenderness to palpation at the insertion of the posterior tibialis. .  OMT findings  Cervical C2 flexed rotated and side bent left C6 extended rotated and side bent left  thoracic  T5 extended rotated and side bent right  Lumbar L5 flexed rotated and side bent right  Sacrum Left on left     .

## 2015-07-14 NOTE — Assessment & Plan Note (Signed)
Minimal improvement with Orthovisc. We will continue to monitor. Patient does not want surgical intervention and is artery been fitted for custom brace. Patient will continue to work on getting proper shoes. Follow-up again in 4 weeks.

## 2015-07-14 NOTE — Assessment & Plan Note (Signed)
No change in management at this time 

## 2015-07-14 NOTE — Assessment & Plan Note (Signed)
Patient has many different factors that her contribute into this pain. Patient does have underlying arthritis, obesity, and multiple different joint pains. Patient likely has a seronegative rheumatoid arthritis. Patient has elected not try any disease modifying agents at this time. We will continue with the same regimen that we are doing at this time. Encourage her to continue to work on weight loss and core strengthening. Patient come back and see me again in 4-6 weeks.

## 2015-07-14 NOTE — Assessment & Plan Note (Signed)
Decision today to treat with OMT was based on Physical Exam  After verbal consent patient was treated with , ME, soft tissue techniques in cervical thoracic, lumbar and sacral areas  Patient tolerated the procedure well with improvement in symptoms  Patient given exercises, stretches and lifestyle modifications  See medications in patient instructions if given  Patient will follow up in 4-6  weeks                           

## 2015-07-14 NOTE — Patient Instructions (Signed)
Good to see you  Allopurinol 200mg  daily for next 2 weeks and then decide if you want to try 100mg  Ice 20 minutes to whatever hurts I like the shoes but try a little water and stretch them with a tennis ball Start to increase activity slowly 10% a week and see how we do I am happy with your back and see how you are doing.  See me again in 4 weeks [place]

## 2015-07-28 ENCOUNTER — Ambulatory Visit (INDEPENDENT_AMBULATORY_CARE_PROVIDER_SITE_OTHER): Payer: Medicare Other | Admitting: Obstetrics and Gynecology

## 2015-07-28 ENCOUNTER — Encounter: Payer: Self-pay | Admitting: Obstetrics and Gynecology

## 2015-07-28 VITALS — BP 150/78 | HR 70 | Resp 14 | Ht 59.5 in | Wt 216.0 lb

## 2015-07-28 DIAGNOSIS — Z124 Encounter for screening for malignant neoplasm of cervix: Secondary | ICD-10-CM | POA: Diagnosis not present

## 2015-07-28 DIAGNOSIS — Z8041 Family history of malignant neoplasm of ovary: Secondary | ICD-10-CM

## 2015-07-28 DIAGNOSIS — Z01419 Encounter for gynecological examination (general) (routine) without abnormal findings: Secondary | ICD-10-CM | POA: Diagnosis not present

## 2015-07-28 DIAGNOSIS — D1801 Hemangioma of skin and subcutaneous tissue: Secondary | ICD-10-CM

## 2015-07-28 HISTORY — DX: Hemangioma of skin and subcutaneous tissue: D18.01

## 2015-07-28 NOTE — Patient Instructions (Signed)
Health Maintenance, Female Adopting a healthy lifestyle and getting preventive care can go a long way to promote health and wellness. Talk with your health care provider about what schedule of regular examinations is right for you. This is a good chance for you to check in with your provider about disease prevention and staying healthy. In between checkups, there are plenty of things you can do on your own. Experts have done a lot of research about which lifestyle changes and preventive measures are most likely to keep you healthy. Ask your health care provider for more information. WEIGHT AND DIET  Eat a healthy diet  Be sure to include plenty of vegetables, fruits, low-fat dairy products, and lean protein.  Do not eat a lot of foods high in solid fats, added sugars, or salt.  Get regular exercise. This is one of the most important things you can do for your health.  Most adults should exercise for at least 150 minutes each week. The exercise should increase your heart rate and make you sweat (moderate-intensity exercise).  Most adults should also do strengthening exercises at least twice a week. This is in addition to the moderate-intensity exercise.  Maintain a healthy weight  Body mass index (BMI) is a measurement that can be used to identify possible weight problems. It estimates body fat based on height and weight. Your health care provider can help determine your BMI and help you achieve or maintain a healthy weight.  For females 20 years of age and older:   A BMI below 18.5 is considered underweight.  A BMI of 18.5 to 24.9 is normal.  A BMI of 25 to 29.9 is considered overweight.  A BMI of 30 and above is considered obese.  Watch levels of cholesterol and blood lipids  You should start having your blood tested for lipids and cholesterol at 67 years of age, then have this test every 5 years.  You may need to have your cholesterol levels checked more often if:  Your lipid  or cholesterol levels are high.  You are older than 67 years of age.  You are at high risk for heart disease.  CANCER SCREENING   Lung Cancer  Lung cancer screening is recommended for adults 55-80 years old who are at high risk for lung cancer because of a history of smoking.  A yearly low-dose CT scan of the lungs is recommended for people who:  Currently smoke.  Have quit within the past 15 years.  Have at least a 30-pack-year history of smoking. A pack year is smoking an average of one pack of cigarettes a day for 1 year.  Yearly screening should continue until it has been 15 years since you quit.  Yearly screening should stop if you develop a health problem that would prevent you from having lung cancer treatment.  Breast Cancer  Practice breast self-awareness. This means understanding how your breasts normally appear and feel.  It also means doing regular breast self-exams. Let your health care provider know about any changes, no matter how small.  If you are in your 20s or 30s, you should have a clinical breast exam (CBE) by a health care provider every 1-3 years as part of a regular health exam.  If you are 40 or older, have a CBE every year. Also consider having a breast X-ray (mammogram) every year.  If you have a family history of breast cancer, talk to your health care provider about genetic screening.  If you   are at high risk for breast cancer, talk to your health care provider about having an MRI and a mammogram every year.  Breast cancer gene (BRCA) assessment is recommended for women who have family members with BRCA-related cancers. BRCA-related cancers include:  Breast.  Ovarian.  Tubal.  Peritoneal cancers.  Results of the assessment will determine the need for genetic counseling and BRCA1 and BRCA2 testing. Cervical Cancer Your health care provider may recommend that you be screened regularly for cancer of the pelvic organs (ovaries, uterus, and  vagina). This screening involves a pelvic examination, including checking for microscopic changes to the surface of your cervix (Pap test). You may be encouraged to have this screening done every 3 years, beginning at age 21.  For women ages 30-65, health care providers may recommend pelvic exams and Pap testing every 3 years, or they may recommend the Pap and pelvic exam, combined with testing for human papilloma virus (HPV), every 5 years. Some types of HPV increase your risk of cervical cancer. Testing for HPV may also be done on women of any age with unclear Pap test results.  Other health care providers may not recommend any screening for nonpregnant women who are considered low risk for pelvic cancer and who do not have symptoms. Ask your health care provider if a screening pelvic exam is right for you.  If you have had past treatment for cervical cancer or a condition that could lead to cancer, you need Pap tests and screening for cancer for at least 20 years after your treatment. If Pap tests have been discontinued, your risk factors (such as having a new sexual partner) need to be reassessed to determine if screening should resume. Some women have medical problems that increase the chance of getting cervical cancer. In these cases, your health care provider may recommend more frequent screening and Pap tests. Colorectal Cancer  This type of cancer can be detected and often prevented.  Routine colorectal cancer screening usually begins at 67 years of age and continues through 67 years of age.  Your health care provider may recommend screening at an earlier age if you have risk factors for colon cancer.  Your health care provider may also recommend using home test kits to check for hidden blood in the stool.  A small camera at the end of a tube can be used to examine your colon directly (sigmoidoscopy or colonoscopy). This is done to check for the earliest forms of colorectal  cancer.  Routine screening usually begins at age 50.  Direct examination of the colon should be repeated every 5-10 years through 67 years of age. However, you may need to be screened more often if early forms of precancerous polyps or small growths are found. Skin Cancer  Check your skin from head to toe regularly.  Tell your health care provider about any new moles or changes in moles, especially if there is a change in a mole's shape or color.  Also tell your health care provider if you have a mole that is larger than the size of a pencil eraser.  Always use sunscreen. Apply sunscreen liberally and repeatedly throughout the day.  Protect yourself by wearing long sleeves, pants, a wide-brimmed hat, and sunglasses whenever you are outside. HEART DISEASE, DIABETES, AND HIGH BLOOD PRESSURE   High blood pressure causes heart disease and increases the risk of stroke. High blood pressure is more likely to develop in:  People who have blood pressure in the high end   of the normal range (130-139/85-89 mm Hg).  People who are overweight or obese.  People who are African American.  If you are 38-23 years of age, have your blood pressure checked every 3-5 years. If you are 61 years of age or older, have your blood pressure checked every year. You should have your blood pressure measured twice--once when you are at a hospital or clinic, and once when you are not at a hospital or clinic. Record the average of the two measurements. To check your blood pressure when you are not at a hospital or clinic, you can use:  An automated blood pressure machine at a pharmacy.  A home blood pressure monitor.  If you are between 45 years and 39 years old, ask your health care provider if you should take aspirin to prevent strokes.  Have regular diabetes screenings. This involves taking a blood sample to check your fasting blood sugar level.  If you are at a normal weight and have a low risk for diabetes,  have this test once every three years after 68 years of age.  If you are overweight and have a high risk for diabetes, consider being tested at a younger age or more often. PREVENTING INFECTION  Hepatitis B  If you have a higher risk for hepatitis B, you should be screened for this virus. You are considered at high risk for hepatitis B if:  You were born in a country where hepatitis B is common. Ask your health care provider which countries are considered high risk.  Your parents were born in a high-risk country, and you have not been immunized against hepatitis B (hepatitis B vaccine).  You have HIV or AIDS.  You use needles to inject street drugs.  You live with someone who has hepatitis B.  You have had sex with someone who has hepatitis B.  You get hemodialysis treatment.  You take certain medicines for conditions, including cancer, organ transplantation, and autoimmune conditions. Hepatitis C  Blood testing is recommended for:  Everyone born from [birth year] through 1967.  Anyone with known risk factors for hepatitis C. Sexually transmitted infections (STIs)  You should be screened for sexually transmitted infections (STIs) including gonorrhea and chlamydia if:  You are sexually active and are younger than 67 years of age.  You are older than 67 years of age and your health care provider tells you that you are at risk for this type of infection.  Your sexual activity has changed since you were last screened and you are at an increased risk for chlamydia or gonorrhea. Ask your health care provider if you are at risk.  If you do not have HIV, but are at risk, it may be recommended that you take a prescription medicine daily to prevent HIV infection. This is called pre-exposure prophylaxis (PrEP). You are considered at risk if:  You are sexually active and do not regularly use condoms or know the HIV status of your partner(s).  You take drugs by injection.  You are sexually  active with a partner who has HIV. Talk with your health care provider about whether you are at high risk of being infected with HIV. If you choose to begin PrEP, you should first be tested for HIV. You should then be tested every 3 months for as long as you are taking PrEP.  PREGNANCY   If you are premenopausal and you may become pregnant, ask your health care provider about preconception counseling.  If you may  become pregnant, take 400 to 800 micrograms (mcg) of folic acid every day.  If you want to prevent pregnancy, talk to your health care provider about birth control (contraception). OSTEOPOROSIS AND MENOPAUSE   Osteoporosis is a disease in which the bones lose minerals and strength with aging. This can result in serious bone fractures. Your risk for osteoporosis can be identified using a bone density scan.  If you are 31 years of age or older, or if you are at risk for osteoporosis and fractures, ask your health care provider if you should be screened.  Ask your health care provider whether you should take a calcium or vitamin D supplement to lower your risk for osteoporosis.  Menopause may have certain physical symptoms and risks.  Hormone replacement therapy may reduce some of these symptoms and risks. Talk to your health care provider about whether hormone replacement therapy is right for you.  HOME CARE INSTRUCTIONS   Schedule regular health, dental, and eye exams.  Stay current with your immunizations.   Do not use any tobacco products including cigarettes, chewing tobacco, or electronic cigarettes.  If you are pregnant, do not drink alcohol.  If you are breastfeeding, limit how much and how often you drink alcohol.  Limit alcohol intake to no more than 1 drink per day for nonpregnant women. One drink equals 12 ounces of beer, 5 ounces of wine, or 1 ounces of hard liquor.  Do not use street drugs.  Do not share needles.  Ask your health care provider for help if  you need support or information about quitting drugs.  Tell your health care provider if you often feel depressed.  Tell your health care provider if you have ever been abused or do not feel safe at home.   This information is not intended to replace advice given to you by your health care provider. Make sure you discuss any questions you have with your health care provider.   Document Released: 07/18/2010 Document Revised: 01/23/2014 Document Reviewed: 12/04/2012 Elsevier Interactive Patient Education 2016 Henry angiomas are noncancerous (benign) skin growths. They are made up of a clump of blood vessels. They occur most often in people over the age of 30. CAUSES  The cause of these skin growths is unknown, but they appear to run in families. SYMPTOMS  Cherry angiomas are smooth, round, red bumps on the skin. They can be as small as a pinhead or as big as a pencil eraser. The color may darken to a purplish red over time. Cherry angiomas are usually found on the trunk, but they can occur anywhere on the body. They are painless, but they may bleed if they are injured. The bleeding is not serious and will stop when firm pressure is applied. DIAGNOSIS  Your health care provider can usually tell what is wrong by performing a physical exam. A tissue sample (biopsy) may also be taken and examined under a microscope. TREATMENT  Usually no treatment is needed for cherry angiomas. They may be removed for improved appearance (cosmetic) reasons. Sometimes, cherry angiomas come back after removal. Removal methods include:  Electrocautery. Heat is used to burn the growth off the skin.  Cryosurgery. Liquid nitrogen is applied to the growth to freeze it. The growth eventually falls off the skin.  Surgery. HOME CARE INSTRUCTIONS  If your skin was covered with a bandage, change and remove the bandage as directed by your health care provider.  Check your skin regularly  for any changes. SEEK MEDICAL CARE IF: You notice any changes or new growths on your skin.   This information is not intended to replace advice given to you by your health care provider. Make sure you discuss any questions you have with your health care provider.   Document Released: 03/13/2001 Document Revised: 01/23/2014 Document Reviewed: 02/10/2011 Elsevier Interactive Patient Education [employer].

## 2015-07-28 NOTE — Progress Notes (Signed)
Patient ID: Kerry [name], female   DOB: 06-15-1950, 65 y.o.   MRN: [id number] 65 y.o. [id number] Married Caucasian female here for annual exam.    Some right lower quadrant discomfort.  This is not a change for her.   Pelvic ultrasound 12/2014 -  Uterus - Fibroids as previously seen - 2.51 cm, 0.92 cm, 0.66 cm, 0.76 cm. EMS - 3.43 mm. Ovaries - normal.  Free fluid - no  Had vaginal drainage a few weeks after her ultrasound - ?pink.  Patient complains of vulvar itching today.  Controlling cholesterol through diet.   Had a foot fracture this year.  Also had a tendon rupture.   Going on a river cruise in [place].  57 year married!  Patient's last menstrual period was 01/16/2002.           Sexually active: No. female The current method of family planning is post menopausal status.    Exercising: No.  Will start water aerobics.  Smoker:  no  Health Maintenance: Pap:  06-24-14 Neg History of abnormal Pap:  Yes, 2005 had abnormal pap--repeated pap in six months was normal--no further work up. MMG:  09-11-13 Density B/Neg/BiRads1:[facility] Colonoscopy:  2011 in [place];next due 2021 BMD:   09-10-13  Result  normal TDaP:  PCP Gardasil:   N/A Hep C: neg with PCP. Screening Labs:  Hb today: PCP, Urine today: not done   reports that she has never smoked. She has never used smokeless tobacco. She reports that she does not drink alcohol or use illicit drugs.  Past Medical History  Diagnosis Date  . Allergic rhinitis, cause unspecified   . Chronic pain   . GERD (gastroesophageal reflux disease)   . HTN (hypertension)   . Sleep apnea   . Abnormal uterine bleeding   . Dyspareunia   . Fibroid   . Urinary incontinence   . Kidney stones   . Bursitis of left shoulder   . Foot fracture, left 01/2015    and torn tendons  . Cherry hemangioma 07/28/15    vulva    Past Surgical History  Procedure Laterality Date  . Umbilical hernia repair  2010  . Carpal tunnel release  1998   . Dental implants  2010  . Esophageal dilation  2010  . Hammer toe surgery      rt  . Septoplasty    . Rotator cuff repair Right 09/2011    Dr. Tamera Punt  . Pelvic laparoscopy  1990  .  dilatation and curettage  2000  . Rotator cuff repair Left 11/11/13    Dr. Tamera Punt     Current Outpatient Prescriptions  Medication Sig Dispense Refill  . Krill Oil 1000 MG CAPS Take 1 capsule by mouth daily.    . NON FORMULARY Methylcobolamin SL:  Takes daily    . allopurinol (ZYLOPRIM) 100 MG tablet Take 1 tablet (100 mg total) by mouth 3 (three) times daily. 270 tablet 1  . AMBULATORY NON FORMULARY MEDICATION Medication Name:Iodine Complex 12.5mg  1 time daily.    . CHROMIUM POLYNICOTINATE PO Take 600 mcg/day by mouth 2 (two) times daily.     . cyanocobalamin (,VITAMIN B-12,) 1000 MCG/ML injection DO AN INJECTION INTRAMUSCULAR WEEKLY FOR THE FIRST 4 WEEKS, THEN INJECT MONTHLY THEREAFTER. (Patient taking differently: DO AN INJECTION INTRAMUSCULAR TWICE MONTHLY) 10 mL 0  . Multiple Vitamin (MULTIVITAMIN) tablet Take 2 tablets by mouth daily.     . Needles & Syringes MISC 1 Can by Does not apply  route once a week. Dispense meals for B12 IM injections please thank you 10 each 3  . Probiotic Product (PROBIOTIC DAILY PO) Take 1 capsule by mouth daily. 5 billion    . pyridoxine (B-6) 500 MG tablet Take 500 mg by mouth daily.    . Saccharomyces boulardii 250 MG CHEW Chew 500 mg/day by mouth.    . vitamin C (ASCORBIC ACID) 500 MG tablet Take by mouth daily.      No current facility-administered medications for this visit.    Family History  Problem Relation Age of Onset  . Rheumatologic disease Mother   . Hypertension Mother   . Hypertension Father   . Rheumatologic disease Sister   . Arthritis Sister     --Rheumatoid arthritis  . Ovarian cancer Sister 6  . Ulcerative colitis Brother   . Glaucoma Brother     ROS:  Pertinent items are noted in HPI.  Otherwise, a comprehensive ROS was  negative.  Exam:   BP 150/78 mmHg  Pulse 70  Resp 14  Ht 4' 11.5" (1.511 m)  Wt 216 lb (97.977 kg)  BMI 42.91 kg/m2  LMP 01/16/2002    General appearance: alert, cooperative and appears stated age Head: Normocephalic, without obvious abnormality, atraumatic Neck: no adenopathy, supple, symmetrical, trachea midline and thyroid normal to inspection and palpation Lungs: clear to auscultation bilaterally Breasts: normal appearance, no masses or tenderness, Inspection negative, No nipple retraction or dimpling, No nipple discharge or bleeding Heart: regularly irregular.  Pause.   Abdomen: soft, non-tender; no masses, no organomegaly Extremities: extremities normal, atraumatic, no cyanosis or edema Skin: Skin color, texture, turgor normal. No rashes or lesions Lymph nodes: Cervical, supraclavicular, and axillary nodes normal. No abnormal inguinal nodes palpated Neurologic: Grossly normal  Pelvic: External genitalia:  Multiple cherry hemangioma.               Urethra:  normal appearing urethra with no masses, tenderness or lesions              Bartholins and Skenes: normal                 Vagina: normal appearing vagina with normal color and discharge, no lesions              Cervix: no lesions              Pap taken: No. Bimanual Exam:  Uterus:  normal size, contour, position, consistency, mobility, non-tender              Adnexa: normal adnexa and no mass, fullness, tenderness              Rectal exam: Yes.  .  Confirms.              Anus:  normal sphincter tone, no lesions  Chaperone was present for exam.  Assessment:   Well woman visit with normal exam. FH of sister with ovarian CA. Cherry hemangioma. I suspect these are symptomatic.  Patient is frequently referring to some drainage when she wipes.  Her EMS on last ultrasound was normal.  Cardiac arrhythmia.   Plan: Yearly mammogram recommended after age 40.  Recommended self breast exam.  Pap and HR HPV as  above. Guidelines for Calcium, Vitamin D, regular exercise program including cardiovascular and weight bearing exercise. Labs performed.  No..     Prescription medication(s) given.  No..    Cherry hemangioma discussed.   Can use benadryl or hydrocortisone cream on the vulva  prn itching.  Return for pelvic ultrasound.  She will follow up with her PCP for follow up of the arrhythmia.  Follow up annually and prn.       After visit summary provided.

## 2015-07-30 DIAGNOSIS — Z1389 Encounter for screening for other disorder: Secondary | ICD-10-CM | POA: Diagnosis not present

## 2015-07-30 DIAGNOSIS — J329 Chronic sinusitis, unspecified: Secondary | ICD-10-CM | POA: Diagnosis not present

## 2015-07-30 DIAGNOSIS — I499 Cardiac arrhythmia, unspecified: Secondary | ICD-10-CM | POA: Diagnosis not present

## 2015-07-30 DIAGNOSIS — Z9181 History of falling: Secondary | ICD-10-CM | POA: Diagnosis not present

## 2015-08-09 NOTE — Assessment & Plan Note (Signed)
Decision today to treat with OMT was based on Physical Exam  After verbal consent patient was treated with , ME, soft tissue techniques in cervical thoracic, lumbar and sacral areas  Patient tolerated the procedure well with improvement in symptoms  Patient given exercises, stretches and lifestyle modifications  See medications in patient instructions if given  Patient will follow up in 4-6  weeks                           

## 2015-08-09 NOTE — Assessment & Plan Note (Signed)
Encourage her to continue with conservative therapy and follow-up with the surgeon

## 2015-08-09 NOTE — Progress Notes (Signed)
CC: Follow  Up right knee pain, and..   Worsening right knee pain. Has degenerative arthritic changes of the knee. Patient has bone-on-bone arthritis. Patient was given viscous supplementation.patient is now 2 months out and states that the pain seems to be more distal. Has had a bursitis previously. Has noticed some more in the new shoes she is wearing. Patient is having discomfort usually when she gets up. Seems to get better with walking a little bit. Standing for long amount of time can exacerbate as well. Some mild increase in instability.  Patient is also having worsening back pain. Has had arthralgias of multiple joints. Likely a seronegative rheumatoid arthritis. Patient has responded well to osteopathic manipulation previously. Patient has tried weight loss but is having difficulty secondary to not being to be able to be active. Continues to have some low amount of pain at all times.  Patient also has a posterior tibialis tear on the left foot. Wearing different orthotics. Following up with the surgeon but not Septemberpatient has done Better than anticipated with conservative therapy.    Past Medical History:  Diagnosis Date  . Abnormal uterine bleeding   . Allergic rhinitis, cause unspecified   . Bursitis of left shoulder   . 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 Chronic pain   . Dyspareunia   . Fibroid   . Foot fracture, left 01/2015   and torn tendons  . GERD (gastroesophageal reflux disease)   . HTN (hypertension)   . Kidney stones   . Sleep apnea   . Urinary incontinence    Past Surgical History:  Procedure Laterality Date  .  dilatation and curettage  2000  . CARPAL TUNNEL RELEASE  1998  . dental implants  2010  . ESOPHAGEAL DILATION  2010  . HAMMER TOE SURGERY     rt  . PELVIC LAPAROSCOPY  1990  . ROTATOR CUFF REPAIR Right 09/2011   Dr. Tamera Punt  . ROTATOR CUFF REPAIR Left 11/11/13   Dr. Tamera Punt   . SEPTOPLASTY    . UMBILICAL HERNIA REPAIR  2010   Social  History  Substance Use Topics  . Smoking status: Never Smoker  . Smokeless tobacco: Never Used  . Alcohol use No   Allergies  Allergen Reactions  . Cefixime Swelling  . Celebrex [Celecoxib] Swelling  . Gabapentin Other (See Comments)    Joint pain  . Levofloxacin Other (See Comments)    Muscle and joint pain  . Other Other (See Comments)    Other Reaction: painful joints TEQUIN. TEQUIN - JOINT AND MUSCLE PAIN  . Penicillins Hives  . Robaxin [Methocarbamol]   . Tomato Hives  . Adhesive [Tape] Rash    Sensitive to EKG leads  . Tetracyclines & Related Rash   Family History  Problem Relation Age of Onset  . Rheumatologic disease Mother   . Hypertension Mother   . Hypertension Father   . Rheumatologic disease Sister   . Arthritis Sister     --Rheumatoid arthritis  . Ovarian cancer Sister 8  . Ulcerative colitis Brother   . Glaucoma Brother      Last menstrual period 01/16/2002. General: No apparent distress alert and oriented x3 mood and affect normal appears ill.  Respiratory: Patient's recent full sentences and does not appear short of breath significant congestion and cough noted + pain to percussion over bilateral frontal sinuses.  Skin: Warm dry intact with no signs of infection or rash  Neuro: Cranial nerves II through XII are  intact, neurovascularly intact in all extremities with 2+ DTRs and 2+ pulses.  Antalgic gait  Knee exam shows the patient does have moderate tenderness over the medial aspect but more so over the has answering area. Less pain over the knee itself. Patient also has less swelling than she has had previously over the knee. Pain with extension of the hamstring   Back Exam:  Inspection: Unremarkable  Motion: Flexion 35 deg, Extension 20 deg, Side Bending to 15 deg bilaterally,  Rotation to 25 degrees possible increasing tightness SLR laying: negative straight leg test which is improvement XSLR laying: Negative  Continued have some tenderness  in the paraspinal musculature of the lumbar spine. FABER:unable to do now secondary to tightness Sensory change: Gross sensation intact to all lumbar and sacral dermatomes.  Reflexes: 2+ at both patellar tendons, 2+ at achilles tendons, Babinski's downgoing.  Strength at foot  4-5 strength of the right lower extremity compared to the left side Ankle exam still shows severe tenderness to palpation at the insertion of the posterior tibialis. .  OMT findings  Cervical C2 flexed rotated and side bent left C6 extended rotated and side bent left  thoracic  T5 extended rotated and side bent right  Lumbar L2 flexed rotated and side bent right L4 flexed rotated and side bent right  Sacrum Left on left   after verbal consent patient was prepped with alcohol swab and with a 25-gauge half-inch needle was injected with a total of 0.5 mL of 0.5% Marcaine and 0.5 mL of Kenalog 40 mg/dL. Post injection instructions given.  Marland Kitchen

## 2015-08-09 NOTE — Assessment & Plan Note (Signed)
Has multiple different pains. Patient has been more of a seronegative arthritis. Has responded somewhat to osteopathic manipulation. Continue the over-the-counter natural supplementations. We discussed proper shoes, encourage weight loss. Patient wants to avoid any type of prescription medications if possible. Does havesignificant allergies to medications as well. Has had some mild response to allopurinol, gabapentin did not help, and does not want chronic pain medications.

## 2015-08-10 ENCOUNTER — Ambulatory Visit: Payer: Medicare Other | Admitting: Family Medicine

## 2015-08-10 ENCOUNTER — Ambulatory Visit (INDEPENDENT_AMBULATORY_CARE_PROVIDER_SITE_OTHER): Payer: Medicare Other | Admitting: Family Medicine

## 2015-08-10 ENCOUNTER — Ambulatory Visit (INDEPENDENT_AMBULATORY_CARE_PROVIDER_SITE_OTHER): Payer: Medicare Other | Admitting: Pulmonary Disease

## 2015-08-10 ENCOUNTER — Encounter: Payer: Self-pay | Admitting: Family Medicine

## 2015-08-10 VITALS — BP 118/82 | HR 84

## 2015-08-10 VITALS — BP 114/62 | HR 82 | Ht 59.5 in | Wt 216.2 lb

## 2015-08-10 DIAGNOSIS — M9908 Segmental and somatic dysfunction of rib cage: Secondary | ICD-10-CM | POA: Diagnosis not present

## 2015-08-10 DIAGNOSIS — G8929 Other chronic pain: Secondary | ICD-10-CM

## 2015-08-10 DIAGNOSIS — S86112D Strain of other muscle(s) and tendon(s) of posterior muscle group at lower leg level, left leg, subsequent encounter: Secondary | ICD-10-CM

## 2015-08-10 DIAGNOSIS — M7150 Other bursitis, not elsewhere classified, unspecified site: Secondary | ICD-10-CM

## 2015-08-10 DIAGNOSIS — G4733 Obstructive sleep apnea (adult) (pediatric): Secondary | ICD-10-CM | POA: Diagnosis not present

## 2015-08-10 DIAGNOSIS — M9901 Segmental and somatic dysfunction of cervical region: Secondary | ICD-10-CM | POA: Diagnosis not present

## 2015-08-10 DIAGNOSIS — Z9989 Dependence on other enabling machines and devices: Secondary | ICD-10-CM

## 2015-08-10 DIAGNOSIS — M9903 Segmental and somatic dysfunction of lumbar region: Secondary | ICD-10-CM | POA: Diagnosis not present

## 2015-08-10 DIAGNOSIS — S96812D Strain of other specified muscles and tendons at ankle and foot level, left foot, subsequent encounter: Secondary | ICD-10-CM

## 2015-08-10 DIAGNOSIS — Z6841 Body Mass Index (BMI) 40.0 and over, adult: Secondary | ICD-10-CM | POA: Diagnosis not present

## 2015-08-10 DIAGNOSIS — M7050 Other bursitis of knee, unspecified knee: Secondary | ICD-10-CM

## 2015-08-10 DIAGNOSIS — M999 Biomechanical lesion, unspecified: Secondary | ICD-10-CM

## 2015-08-10 NOTE — Progress Notes (Signed)
Current Outpatient Prescriptions on File Prior to Visit  Medication Sig  . AMBULATORY NON FORMULARY MEDICATION Medication Name:Iodine Complex 12.5mg  1 time every other day.  . CHROMIUM POLYNICOTINATE PO Take 600 mcg/day by mouth 2 (two) times daily.   . cyanocobalamin (,VITAMIN B-12,) 1000 MCG/ML injection DO AN INJECTION INTRAMUSCULAR WEEKLY FOR THE FIRST 4 WEEKS, THEN INJECT MONTHLY THEREAFTER. (Patient taking differently: DO AN INJECTION INTRAMUSCULAR TWICE MONTHLY)  . Multiple Vitamin (MULTIVITAMIN) tablet Take 2 tablets by mouth daily.   . Needles & Syringes MISC 1 Can by Does not apply route once a week. Dispense meals for B12 IM injections please thank you  . NON FORMULARY Place 5,000 mcg under the tongue daily. Med Name:  Methylcobolamin SL  . Probiotic Product (PROBIOTIC DAILY PO) Take 1 capsule by mouth daily. 5 billion  . vitamin C (ASCORBIC ACID) 500 MG tablet Take by mouth daily.    No current facility-administered medications on file prior to visit.      Chief Complaint  Patient presents with  . Follow-up    Wears CPAP nightly. Denies problems with pressure. Needs a new mask. DME: [facility]     Tests HST 03/04/15 >> AHI 21.6, SpO2 low 80% CPAP 07/10/15 to 08/08/15 >> used on 30 of 30 nights with average 5 hrs 23 min.  Average AHI 0.1 with median CPAP 7 and 95 th percentile CPAP 9 cm H2O  Past medical hx Allergies, GERD, HTN, Nephrolithiasis, Chronic pain  Past surgical hx, Allergies, Family hx, Social hx all reviewed.  Vital Signs BP 114/62 (BP Location: Right Arm, Cuff Size: Normal)   Pulse 82   Ht 4' 11.5" (1.511 m)   Wt 216 lb 3.2 oz (98.1 kg)   LMP 01/16/2002   SpO2 97%   BMI 42.94 kg/m   History of Present Illness Kerry [name] is a 65 y.o. female with obstructive sleep apnea.  She is doing much better since change to auto CPAP with new machine for women.  He has nasal mask >> has to wear dental appliance to get mask to fit correctly.  Physical  Exam  General - No distress ENT - No sinus tenderness, no oral exudate, no LAN, MP 3, wears dentures. Cardiac - s1s2 regular, no murmur Chest - No wheeze/rales/dullness Back - No focal tenderness Abd - Soft, non-tender Ext - No edema Neuro - Normal strength Skin - No rashes Psych - normal mood, and behavior  Assessment/Plan  Obstructive sleep apnea. - she is compliant with CPAP and reports benefit - continue auto CPAP - will arrange for CPAP mask refitting  Obesity. - discussed importance of weight loss   Patient Instructions  Will arrange for CPAP mask refit by your home care company  Follow up in 1 year    Chesley Mires, MD [place] Pager:  [phone] 08/10/2015, 3:22 PM

## 2015-08-10 NOTE — Patient Instructions (Signed)
Will arrange for CPAP mask refit by your home care company  Follow up in 1 year

## 2015-08-10 NOTE — Progress Notes (Signed)
Pre visit review using our clinic review tool, if applicable. No additional management support is needed unless otherwise documented below in the visit note. 

## 2015-08-10 NOTE — Assessment & Plan Note (Signed)
Patient responded very well to the injection. We discussed the thigh compression sleeve. We discussed heel lift in the shoe it can be beneficial. We do not make any significant changes to her feet secondary to all the difficult he we had previously. Patient will follow-up again in 4 weeks and at that time we may want to repeat another injection with her going on a trip in the near future.

## 2015-08-10 NOTE — Patient Instructions (Signed)
Injected bursae knee again  Ice 20 minutes 2 times daily. Usually after activity and before bed. Ask cardiologist about the effexor and if safe with your heart.  I like the shoe but add a small heel lift Thigh compression sleeve Turmeric 500mg  daily  See me again before you leave

## 2015-08-12 DIAGNOSIS — E785 Hyperlipidemia, unspecified: Secondary | ICD-10-CM | POA: Insufficient documentation

## 2015-08-12 DIAGNOSIS — Z6841 Body Mass Index (BMI) 40.0 and over, adult: Secondary | ICD-10-CM | POA: Diagnosis not present

## 2015-08-12 DIAGNOSIS — G4733 Obstructive sleep apnea (adult) (pediatric): Secondary | ICD-10-CM | POA: Diagnosis not present

## 2015-08-12 DIAGNOSIS — R0600 Dyspnea, unspecified: Secondary | ICD-10-CM | POA: Insufficient documentation

## 2015-08-12 DIAGNOSIS — R002 Palpitations: Secondary | ICD-10-CM | POA: Diagnosis not present

## 2015-08-12 DIAGNOSIS — R0609 Other forms of dyspnea: Secondary | ICD-10-CM

## 2015-08-16 DIAGNOSIS — M21862 Other specified acquired deformities of left lower leg: Secondary | ICD-10-CM | POA: Diagnosis not present

## 2015-08-16 DIAGNOSIS — M76822 Posterior tibial tendinitis, left leg: Secondary | ICD-10-CM | POA: Diagnosis not present

## 2015-08-16 DIAGNOSIS — Z888 Allergy status to other drugs, medicaments and biological substances status: Secondary | ICD-10-CM | POA: Diagnosis not present

## 2015-08-16 DIAGNOSIS — Z880 Allergy status to penicillin: Secondary | ICD-10-CM | POA: Diagnosis not present

## 2015-08-16 DIAGNOSIS — M2140 Flat foot [pes planus] (acquired), unspecified foot: Secondary | ICD-10-CM | POA: Diagnosis not present

## 2015-08-16 DIAGNOSIS — Z881 Allergy status to other antibiotic agents status: Secondary | ICD-10-CM | POA: Diagnosis not present

## 2015-08-16 DIAGNOSIS — Z886 Allergy status to analgesic agent status: Secondary | ICD-10-CM | POA: Diagnosis not present

## 2015-08-30 DIAGNOSIS — Z2001 Contact with and (suspected) exposure to intestinal infectious diseases due to Escherichia coli (E. coli): Secondary | ICD-10-CM | POA: Diagnosis not present

## 2015-09-02 DIAGNOSIS — R002 Palpitations: Secondary | ICD-10-CM | POA: Diagnosis not present

## 2015-09-02 DIAGNOSIS — G4733 Obstructive sleep apnea (adult) (pediatric): Secondary | ICD-10-CM | POA: Diagnosis not present

## 2015-09-02 DIAGNOSIS — E785 Hyperlipidemia, unspecified: Secondary | ICD-10-CM | POA: Diagnosis not present

## 2015-09-02 DIAGNOSIS — R0609 Other forms of dyspnea: Secondary | ICD-10-CM | POA: Diagnosis not present

## 2015-09-06 DIAGNOSIS — R002 Palpitations: Secondary | ICD-10-CM | POA: Diagnosis not present

## 2015-09-09 ENCOUNTER — Encounter: Payer: Self-pay | Admitting: Family Medicine

## 2015-09-09 ENCOUNTER — Ambulatory Visit (INDEPENDENT_AMBULATORY_CARE_PROVIDER_SITE_OTHER): Payer: Medicare Other | Admitting: Family Medicine

## 2015-09-09 VITALS — BP 136/70 | HR 91 | Wt 215.0 lb

## 2015-09-09 DIAGNOSIS — M2550 Pain in unspecified joint: Secondary | ICD-10-CM | POA: Diagnosis not present

## 2015-09-09 DIAGNOSIS — S92252A Displaced fracture of navicular [scaphoid] of left foot, initial encounter for closed fracture: Secondary | ICD-10-CM

## 2015-09-09 DIAGNOSIS — M9901 Segmental and somatic dysfunction of cervical region: Secondary | ICD-10-CM | POA: Diagnosis not present

## 2015-09-09 DIAGNOSIS — M9908 Segmental and somatic dysfunction of rib cage: Secondary | ICD-10-CM | POA: Diagnosis not present

## 2015-09-09 DIAGNOSIS — M545 Low back pain, unspecified: Secondary | ICD-10-CM

## 2015-09-09 DIAGNOSIS — M1711 Unilateral primary osteoarthritis, right knee: Secondary | ICD-10-CM | POA: Diagnosis not present

## 2015-09-09 DIAGNOSIS — M9903 Segmental and somatic dysfunction of lumbar region: Secondary | ICD-10-CM

## 2015-09-09 DIAGNOSIS — M999 Biomechanical lesion, unspecified: Secondary | ICD-10-CM

## 2015-09-09 MED ORDER — B-12 1000 MCG/ML IJ KIT: 1.0000 | PACK | INTRAMUSCULAR | 3 refills | Status: DC

## 2015-09-09 NOTE — Assessment & Plan Note (Signed)
Decision today to treat with OMT was based on Physical Exam  After verbal consent patient was treated with , ME, soft tissue techniques in cervical thoracic, lumbar and sacral areas  Patient tolerated the procedure well with improvement in symptoms  Patient given exercises, stretches and lifestyle modifications  See medications in patient instructions if given  Patient will follow up in 4-6  weeks                           

## 2015-09-09 NOTE — Assessment & Plan Note (Signed)
Continues to be concerning. Discussed with patient that we would need to consider thinking outside of the box. Patient can consider home exercises, we can consider treating for some other type of infectious etiology that we do not know. Still believe that this is a seronegative rheumatoid arthritis and patient is unwilling to do any of the disease modifying agents. Patient will come back and see me again in 4 weeks.

## 2015-09-09 NOTE — Patient Instructions (Signed)
Great to see you  Have a great trip Good luck with the shoes in the long run.  You are getting close.  For the knee lets continue to watch it and try the compression  See me when you get back in 4 weeks!

## 2015-09-09 NOTE — Assessment & Plan Note (Signed)
Continued to be followed by specialist.

## 2015-09-09 NOTE — Assessment & Plan Note (Signed)
Did not respond well to the Orthovisc. We will continue to monitor. Forcing symptoms I would consider a pes anserine bursa injection

## 2015-09-09 NOTE — Progress Notes (Signed)
CC: Follow  Up right knee pain, and..   Worsening right knee pain. Has degenerative arthritic changes of the knee. Patient has bone-on-bone arthritis. Patient was given viscous supplementation.patient is now 3 months out. Patient was given a corticosteroid injection. Did not notice any significant improvement-and was worse initially. Patient continues to have instability. Not wearing the brace because moves. Finds that she swells more when she wears the brace as well. Difficult to walk but the new issues has been somewhat helpful.  Patient is also having worsening back pain. Has had arthralgias of multiple joints. Likely a seronegative rheumatoid arthritis. Patient has responded well to osteopathic manipulation previously. Continues to have tightness. Did have a grandchild for 3 weeks. States that she was doing fairly again.  Patient also has a posterior tibialis tear of the left foot. Patient did see specialist and they have decided that we will continue with conservative therapy. Worsening symptoms or consider surgery.    Past Medical History:  Diagnosis Date  . Abnormal uterine bleeding   . Allergic rhinitis, cause unspecified   . Bursitis of left shoulder   . 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 Chronic pain   . Dyspareunia   . Fibroid   . Foot fracture, left 01/2015   and torn tendons  . GERD (gastroesophageal reflux disease)   . HTN (hypertension)   . Kidney stones   . Sleep apnea   . Urinary incontinence    Past Surgical History:  Procedure Laterality Date  .  dilatation and curettage  2000  . CARPAL TUNNEL RELEASE  1998  . dental implants  2010  . ESOPHAGEAL DILATION  2010  . HAMMER TOE SURGERY     rt  . PELVIC LAPAROSCOPY  1990  . ROTATOR CUFF REPAIR Right 09/2011   Dr. Tamera Punt  . ROTATOR CUFF REPAIR Left 11/11/13   Dr. Tamera Punt   . SEPTOPLASTY    . UMBILICAL HERNIA REPAIR  2010   Social History  Substance Use Topics  . Smoking status: Never Smoker  . Smokeless  tobacco: Never Used  . Alcohol use No   Allergies  Allergen Reactions  . Cefixime Swelling  . Celebrex [Celecoxib] Swelling  . Gabapentin Other (See Comments)    Joint pain  . Levofloxacin Other (See Comments)    Muscle and joint pain  . Other Other (See Comments)    Other Reaction: painful joints TEQUIN. TEQUIN - JOINT AND MUSCLE PAIN  . Penicillins Hives  . Robaxin [Methocarbamol]   . Tomato Hives  . Adhesive [Tape] Rash    Sensitive to EKG leads  . Tetracyclines & Related Rash   Family History  Problem Relation Age of Onset  . Rheumatologic disease Mother   . Hypertension Mother   . Hypertension Father   . Rheumatologic disease Sister   . Arthritis Sister     --Rheumatoid arthritis  . Ovarian cancer Sister 17  . Ulcerative colitis Brother   . Glaucoma Brother      Blood pressure 136/70, pulse 91, weight 215 lb (97.5 kg), last menstrual period 01/16/2002, SpO2 95 %. General: No apparent distress alert and oriented x3 mood and affect normal appears ill.  Respiratory: Patient's recent full sentences and does not appear short of breath significant congestion and cough noted + pain to percussion over bilateral frontal sinuses.  Skin: Warm dry intact with no signs of infection or rash  Neuro: Cranial nerves II through XII are intact, neurovascularly intact in all extremities with 2+  DTRs and 2+ pulses.  Antalgic gait  Knee exam shows the patient does have moderate tenderness over the medial aspect but more so over the Pes anserine area as well. More pain over the medial joint space as well. Crepitus noted. Instability with valgus force.   Back Exam:  Inspection: Unremarkable  Motion: Flexion 35 deg, Extension 20 deg, Side Bending to 15 deg bilaterally,  Rotation to 25 degrees possible increasing tightness SLR laying: negative straight leg test which is improvement XSLR laying: Negative  Continued have some tenderness in the paraspinal musculature of the lumbar  spine. FABER:unable to do now secondary to tightness Sensory change: Gross sensation intact to all lumbar and sacral dermatomes.  Reflexes: 2+ at both patellar tendons, 2+ at achilles tendons, Babinski's downgoing.  Strength at foot  4-5 strength of the right lower extremity compared to the left side Ankle exam still shows severe tenderness to palpation at the insertion of the posterior tibialis. .  OMT findings  Cervical C2 flexed rotated and side bent left C6 extended rotated and side bent left  thoracic  T5 extended rotated and side bent right  Lumbar L2 flexed rotated and side bent right L4 flexed rotated and side bent right  Sacrum Left on left    .

## 2015-09-09 NOTE — Assessment & Plan Note (Signed)
Continues to be secondary to muscle imbalances, encouraged home exercises, core strengthening, weight loss. Patient will come back and see me again in 4 weeks.

## 2015-09-21 ENCOUNTER — Telehealth: Payer: Self-pay | Admitting: Obstetrics and Gynecology

## 2015-09-21 NOTE — Telephone Encounter (Signed)
Called patient to review benefits for a recommended procedure. Phone did not roll over into a voicemail.

## 2015-10-06 DIAGNOSIS — H04123 Dry eye syndrome of bilateral lacrimal glands: Secondary | ICD-10-CM | POA: Diagnosis not present

## 2015-10-06 DIAGNOSIS — H524 Presbyopia: Secondary | ICD-10-CM | POA: Diagnosis not present

## 2015-10-06 DIAGNOSIS — H25813 Combined forms of age-related cataract, bilateral: Secondary | ICD-10-CM | POA: Diagnosis not present

## 2015-10-06 DIAGNOSIS — H35373 Puckering of macula, bilateral: Secondary | ICD-10-CM | POA: Diagnosis not present

## 2015-10-11 ENCOUNTER — Other Ambulatory Visit: Payer: Self-pay | Admitting: Obstetrics and Gynecology

## 2015-10-11 DIAGNOSIS — Z1231 Encounter for screening mammogram for malignant neoplasm of breast: Secondary | ICD-10-CM

## 2015-10-11 NOTE — Progress Notes (Signed)
CC: Follow  Up right knee pain, and..   Worsening right knee pain. Has degenerative arthritic changes of the knee. Patient has bone-on-bone arthritis. Patient is failed all other conservative therapy. Patient's 5 increasing instability. Did go out of the country and was doing a lot of walking. Having some instability of the knee and feels that maybe she should have surgical intervention.  Patient is also having low back pain.It is secondary to some muscle weakness as well as being overweight. Probably compensated for some of the knee as well. Has changed shoes dramatically then made some mild improvement.    Patient also has a posterior tibialis tear of the left foot. Seems to be continuing to improve slowly.   Past Medical History:  Diagnosis Date  . Abnormal uterine bleeding   . Allergic rhinitis, cause unspecified   . Bursitis of left shoulder   . 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 Chronic pain   . Dyspareunia   . Fibroid   . Foot fracture, left 01/2015   and torn tendons  . GERD (gastroesophageal reflux disease)   . HTN (hypertension)   . Kidney stones   . Sleep apnea   . Urinary incontinence    Past Surgical History:  Procedure Laterality Date  .  dilatation and curettage  2000  . CARPAL TUNNEL RELEASE  1998  . dental implants  2010  . ESOPHAGEAL DILATION  2010  . HAMMER TOE SURGERY     rt  . PELVIC LAPAROSCOPY  1990  . ROTATOR CUFF REPAIR Right 09/2011   Dr. Tamera Punt  . ROTATOR CUFF REPAIR Left 11/11/13   Dr. Tamera Punt   . SEPTOPLASTY    . UMBILICAL HERNIA REPAIR  2010   Social History  Substance Use Topics  . Smoking status: Never Smoker  . Smokeless tobacco: Never Used  . Alcohol use No   Allergies  Allergen Reactions  . Cefixime Swelling  . Celebrex [Celecoxib] Swelling  . Gabapentin Other (See Comments)    Joint pain  . Levofloxacin Other (See Comments)    Muscle and joint pain  . Other Other (See Comments)    Other Reaction: painful joints TEQUIN.  TEQUIN - JOINT AND MUSCLE PAIN  . Penicillins Hives  . Robaxin [Methocarbamol]   . Tomato Hives  . Adhesive [Tape] Rash    Sensitive to EKG leads  . Tetracyclines & Related Rash   Family History  Problem Relation Age of Onset  . Rheumatologic disease Mother   . Hypertension Mother   . Hypertension Father   . Rheumatologic disease Sister   . Arthritis Sister     --Rheumatoid arthritis  . Ovarian cancer Sister 51  . Ulcerative colitis Brother   . Glaucoma Brother      Blood pressure 138/82, pulse 89, weight 215 lb (97.5 kg), last menstrual period 01/16/2002, SpO2 97 %. General: No apparent distress alert and oriented x3 mood and affect normal appears ill.  Respiratory: Patient's recent full sentences and does not appear short of breath significant congestion and cough noted + pain to percussion over bilateral frontal sinuses.  Skin: Warm dry intact with no signs of infection or rash  Neuro: Cranial nerves II through XII are intact, neurovascularly intact in all extremities with 2+ DTRs and 2+ pulses.  Antalgic gait  Knee exam shows the patient does have moderate tenderness over the medial aspect but more so over the Pes anserine area as well. More pain over the medial joint space as well.  Crepitus noted. Instability with valgus force.   Back Exam:  Inspection: Unremarkable  Motion: Flexion 35 deg, Extension 15 deg, Side Bending to 15 deg bilaterally,  Rotation to 25 degrees possible increasing tightness SLR laying: negative straight leg test which is improvement XSLR laying: Negative  Continued have some tenderness in the paraspinal musculature of the lumbar spine.  FABER: Still tight Sensory change: Gross sensation intact to all lumbar and sacral dermatomes.  Reflexes: 2+ at both patellar tendons, 2+ at achilles tendons, Babinski's downgoing.  Strength at foot  4/5 strength of the right lower extremity compared to the left side Ankle exam still shows severe tenderness  to palpation at the insertion of the posterior tibialis. .  OMT findings  Cervical C2 flexed rotated and side bent left  thoracic  T5 extended rotated and side bent right   Lumbar L1 flexed rotated and side bent right L4 flexed rotated and side bent right  Sacrum Left on left    .

## 2015-10-12 ENCOUNTER — Ambulatory Visit (INDEPENDENT_AMBULATORY_CARE_PROVIDER_SITE_OTHER): Payer: Medicare Other | Admitting: Family Medicine

## 2015-10-12 ENCOUNTER — Encounter: Payer: Self-pay | Admitting: Family Medicine

## 2015-10-12 ENCOUNTER — Ambulatory Visit: Payer: Medicare Other | Admitting: Family Medicine

## 2015-10-12 VITALS — BP 138/82 | HR 89 | Wt 215.0 lb

## 2015-10-12 DIAGNOSIS — M9901 Segmental and somatic dysfunction of cervical region: Secondary | ICD-10-CM

## 2015-10-12 DIAGNOSIS — M25561 Pain in right knee: Secondary | ICD-10-CM

## 2015-10-12 DIAGNOSIS — M9903 Segmental and somatic dysfunction of lumbar region: Secondary | ICD-10-CM

## 2015-10-12 DIAGNOSIS — M9908 Segmental and somatic dysfunction of rib cage: Secondary | ICD-10-CM | POA: Diagnosis not present

## 2015-10-12 DIAGNOSIS — M545 Low back pain, unspecified: Secondary | ICD-10-CM

## 2015-10-12 DIAGNOSIS — M999 Biomechanical lesion, unspecified: Secondary | ICD-10-CM

## 2015-10-12 NOTE — Patient Instructions (Signed)
Good to see you  I am impressed.  Keep it up  I am glad you had a great trip  Dr Rhona Raider will call you  See me again in 4-6 weeks 30 minute appointments please

## 2015-10-12 NOTE — Assessment & Plan Note (Signed)
Patient continues to have difficulty. Patient encouraged to continue to attempt to lose weight. Am hoping that when patient has a potential knee replacement that she will do well overall. Patient has responded well to osteopathic manipulation and will follow-up again in 4 weeks.

## 2015-10-12 NOTE — Assessment & Plan Note (Signed)
Decision today to treat with OMT was based on Physical Exam  After verbal consent patient was treated with , ME, soft tissue techniques in cervical thoracic, lumbar and sacral areas  Patient tolerated the procedure well with improvement in symptoms  Patient given exercises, stretches and lifestyle modifications  See medications in patient instructions if given  Patient will follow up in 4-6  weeks                           

## 2015-10-13 DIAGNOSIS — Z6841 Body Mass Index (BMI) 40.0 and over, adult: Secondary | ICD-10-CM | POA: Diagnosis not present

## 2015-10-13 DIAGNOSIS — R002 Palpitations: Secondary | ICD-10-CM | POA: Diagnosis not present

## 2015-10-13 DIAGNOSIS — E785 Hyperlipidemia, unspecified: Secondary | ICD-10-CM | POA: Diagnosis not present

## 2015-10-13 DIAGNOSIS — G4733 Obstructive sleep apnea (adult) (pediatric): Secondary | ICD-10-CM | POA: Diagnosis not present

## 2015-10-18 DIAGNOSIS — M1711 Unilateral primary osteoarthritis, right knee: Secondary | ICD-10-CM | POA: Diagnosis not present

## 2015-10-21 ENCOUNTER — Other Ambulatory Visit: Payer: Medicare Other

## 2015-10-21 ENCOUNTER — Other Ambulatory Visit: Payer: Medicare Other | Admitting: Obstetrics and Gynecology

## 2015-10-25 ENCOUNTER — Ambulatory Visit
Admission: RE | Admit: 2015-10-25 | Discharge: 2015-10-25 | Disposition: A | Discharge status: Home / Self Care | Payer: Medicare Other | Source: Ambulatory Visit | Attending: Obstetrics and Gynecology | Admitting: Obstetrics and Gynecology

## 2015-10-25 ENCOUNTER — Encounter: Payer: Self-pay | Admitting: Internal Medicine

## 2015-10-25 ENCOUNTER — Ambulatory Visit (INDEPENDENT_AMBULATORY_CARE_PROVIDER_SITE_OTHER): Payer: Medicare Other | Admitting: Internal Medicine

## 2015-10-25 VITALS — BP 152/88 | HR 92 | Temp 97.7°F | Wt 217.0 lb

## 2015-10-25 DIAGNOSIS — M25561 Pain in right knee: Secondary | ICD-10-CM

## 2015-10-25 DIAGNOSIS — M25562 Pain in left knee: Secondary | ICD-10-CM | POA: Diagnosis not present

## 2015-10-25 DIAGNOSIS — M25552 Pain in left hip: Secondary | ICD-10-CM | POA: Diagnosis not present

## 2015-10-25 DIAGNOSIS — J322 Chronic ethmoidal sinusitis: Secondary | ICD-10-CM | POA: Diagnosis not present

## 2015-10-25 DIAGNOSIS — M25551 Pain in right hip: Secondary | ICD-10-CM | POA: Diagnosis not present

## 2015-10-25 DIAGNOSIS — Z1231 Encounter for screening mammogram for malignant neoplasm of breast: Secondary | ICD-10-CM | POA: Diagnosis not present

## 2015-10-25 DIAGNOSIS — G8929 Other chronic pain: Secondary | ICD-10-CM | POA: Diagnosis not present

## 2015-10-25 NOTE — Progress Notes (Signed)
RFV: new patient referral concern for recurrent sinus infection  Patient ID: Kerry [name], female   DOB: February 11, 1950, 65 y.o.   MRN: [id number]  HPI Kerry [name] is a 65yo F who reports having sinus and gum discomfort after having dental implants in 2011. Severe osteoarthritis and needs total knee replacement, with orthopedist in town. Concern that she has infection.  Hx of poor dentition with 1st root canal at age of 65yo.  No history of frequent sinus, nad pneumonia   Hx of scarlet fever as a child, and hepatitis at 60 yo., presumably hep a   - hiv negative, hepatitis c negative. Outpatient Encounter Prescriptions as of 10/25/2015  Medication Sig  . AMBULATORY NON FORMULARY MEDICATION Medication Name:Iodine Complex 12.'5mg'$  1 time every fourth day.  . Cholecalciferol (VITAMIN D3) 5000 units CAPS Take 5,000 Units by mouth every other day.   . CHROMIUM POLYNICOTINATE PO Take 600 mcg/day by mouth 2 (two) times daily.   . Cyanocobalamin (B-12) 1000 MCG/ML KIT Inject 1 Dose as directed every 21 ( twenty-one) days.  . Multiple Vitamin (MULTIVITAMIN) tablet Take 2 tablets by mouth daily.   . Needles & Syringes MISC 1 Can by Does not apply route once a week. Dispense meals for B12 IM injections please thank you  . NON FORMULARY Place 5,000 mcg under the tongue every other day. Med Name:  Methylcobolamin SL  . Probiotic Product (PROBIOTIC DAILY PO) Take 1 capsule by mouth daily. 5 billion  . vitamin C (ASCORBIC ACID) 500 MG tablet Take by mouth daily.    No facility-administered encounter medications on file as of 10/25/2015.     Allergies  Allergen Reactions  . Cefixime Swelling  . Celebrex [Celecoxib] Swelling  . Gabapentin Other (See Comments)    Joint pain  . Levofloxacin Other (See Comments)    Muscle and joint pain  . Other Other (See Comments)    Other Reaction: painful joints TEQUIN. TEQUIN - JOINT AND MUSCLE PAIN  . Penicillins Hives  . Robaxin [Methocarbamol]   . Tomato Hives   . Adhesive [Tape] Rash    Sensitive to EKG leads  . Tetracyclines & Related Rash   Patient Active Problem List   Diagnosis Date Noted  . Pes anserine bursitis 08/10/2015  . Rupture of left posterior tibialis tendon 03/25/2015  . OSA (obstructive sleep apnea) 03/05/2015  . Navicular fracture of ankle 01/21/2015  . Sinus infection 12/25/2014  . Uric acid arthropathy 03/23/2014  . High plasma oxalate 02/17/2014  . Nonallopathic lesion of cervical region 12/19/2012  . Fibroids 05/28/2012  . Family history of ovarian cancer 05/28/2012  . Strain of quadriceps muscle 04/17/2012  . Nonallopathic lesion of costovertebral region 01/23/2012  . Left shoulder pain 12/20/2011  . Rotator cuff disorder 09/26/2011  . Bilateral foot pain 09/20/2011  . Arthralgia of multiple joints 07/26/2011  . Arthritis of knee, degenerative 07/26/2011  . Arthritis of hand, degenerative 07/26/2011  . LBP (low back pain) 07/26/2011  . Vitamin B 12 deficiency 03/05/2011  . TMJ (temporomandibular joint syndrome) 03/05/2011  . Pes planus 03/01/2011  . Right knee pain 03/01/2011  . Greater trochanteric bursitis of left hip 03/01/2011  . Nonallopathic lesion of lumbosacral region 01/27/2011  . Obesity 12/29/2010  . Fatigue 12/29/2010  . Allergic rhinitis, cause unspecified   . Chronic pain   . GERD (gastroesophageal reflux disease)   . Sleep apnea      Health Maintenance Due  Topic Date Due  . Hepatitis C Screening  11-09-1950  . PNA vac Low Risk Adult (1 of 2 - PCV13) 10/18/2013  . INFLUENZA VACCINE  08/17/2015  . MAMMOGRAM  09/11/2015    Soc hx:  Social History  Substance Use Topics  . Smoking status: Never Smoker  . Smokeless tobacco: Never Used  . Alcohol use No  family history includes Arthritis in her sister; Glaucoma in her brother; Hypertension in her father and mother; Ovarian cancer (age of onset: 40) in her sister; Rheumatologic disease in her mother and sister; Ulcerative colitis in her  brother. Review of Systems  Constitutional: Negative for fever, chills, diaphoresis, activity change, appetite change, fatigue and unexpected weight change.  HENT: Negative for congestion, sore throat, rhinorrhea, sneezing, trouble swallowing and sinus pressure.  Eyes: Negative for photophobia and visual disturbance.  Respiratory: Negative for cough, chest tightness, shortness of breath, wheezing and stridor.  Cardiovascular: Negative for chest pain, palpitations and leg swelling.  Gastrointestinal: Negative for nausea, vomiting, abdominal pain, diarrhea, constipation, blood in stool, abdominal distention and anal bleeding.  Genitourinary: Negative for dysuria, hematuria, flank pain and difficulty urinating.  Musculoskeletal: Negative for myalgias, back pain, joint swelling, arthralgias and gait problem.  Skin: Negative for color change, pallor, rash and wound.  Neurological: Negative for dizziness, tremors, weakness and light-headedness.  Hematological: Negative for adenopathy. Does not bruise/bleed easily.  Psychiatric/Behavioral: Negative for behavioral problems, confusion, sleep disturbance, dysphoric mood, decreased concentration and agitation.     Physical Exam  BP (!) 152/88   Pulse 92   Temp 97.7 F (36.5 C) (Oral)   Wt 98.4 kg (217 lb)   LMP 01/16/2002   BMI 43.10 kg/m  Physical Exam  Constitutional:  oriented to person, place, and time. appears well-developed and well-nourished. No distress.  HENT: Leigh/AT, PERRLA, no scleral icterus Mouth/Throat: Oropharynx is clear and moist. No oropharyngeal exudate.  Cardiovascular: Normal rate, regular rhythm and normal heart sounds. Exam reveals no gallop and no friction rub.  No murmur heard.  Pulmonary/Chest: Effort normal and breath sounds normal. No respiratory distress.  has no wheezes.  Neck = supple, no nuchal rigidity Abdominal: Soft. Bowel sounds are normal.  exhibits no distension. There is no tenderness.  Lymphadenopathy:  no cervical adenopathy. No axillary adenopathy Neurological: alert and oriented to person, place, and time.  Skin: Skin is warm and dry. No rash noted. No erythema.  Psychiatric: a normal mood and affect.  behavior is normal.   CBC Lab Results  Component Value Date   WBC 7.3 12/24/2014   RBC 4.48 12/24/2014   HGB 13.7 12/24/2014   HCT 41.4 12/24/2014   PLT 243.0 12/24/2014   MCV 92.6 12/24/2014   MCH 29.9 07/05/2011   MCHC 33.1 12/24/2014   RDW 15.4 12/24/2014   LYMPHSABS 2.2 12/24/2014   MONOABS 0.5 12/24/2014   EOSABS 0.3 12/24/2014   BASOSABS 0.0 12/24/2014   BMET Lab Results  Component Value Date   NA 142 12/24/2014   K 4.5 12/24/2014   CL 104 12/24/2014   CO2 29 12/24/2014   GLUCOSE 98 12/24/2014   BUN 16 12/24/2014   CREATININE 0.76 12/24/2014   CALCIUM 9.5 12/24/2014     Assessment and Plan Possible fever of unknown origin = clinically the patient has had myalgia, malaise after dental work (dental implants) which may reflect transient illness but presently she is not overtly symptomatic. Unclear if she has an FUO - wlll get fever diary - will get immunoglobulin testing - on review of her paperwork from her PCP, she  has undergone most of hte FUO work up. NOt sure if we would contribute to much more.  Will see her back in 2-4 wk

## 2015-10-26 LAB — IGG, IGA, IGM
IGA: 217 mg/dL (ref 81–463)
IGM, SERUM: 63 mg/dL (ref 48–271)
IgG (Immunoglobin G), Serum: 1042 mg/dL (ref 694–1618)

## 2015-10-26 LAB — SEDIMENTATION RATE: Sed Rate: 10 mm/hr (ref 0–30)

## 2015-10-26 LAB — IGE: IgE (Immunoglobulin E), Serum: 18 kU/L (ref <115)

## 2015-10-28 ENCOUNTER — Ambulatory Visit (INDEPENDENT_AMBULATORY_CARE_PROVIDER_SITE_OTHER): Payer: Medicare Other | Admitting: Obstetrics and Gynecology

## 2015-10-28 ENCOUNTER — Ambulatory Visit (INDEPENDENT_AMBULATORY_CARE_PROVIDER_SITE_OTHER): Payer: Medicare Other

## 2015-10-28 ENCOUNTER — Encounter: Payer: Self-pay | Admitting: Obstetrics and Gynecology

## 2015-10-28 VITALS — BP 110/66 | HR 80 | Resp 16 | Ht 60.0 in | Wt 220.0 lb

## 2015-10-28 DIAGNOSIS — Z8041 Family history of malignant neoplasm of ovary: Secondary | ICD-10-CM

## 2015-10-28 DIAGNOSIS — D259 Leiomyoma of uterus, unspecified: Secondary | ICD-10-CM

## 2015-10-28 NOTE — Progress Notes (Signed)
GYNECOLOGY  VISIT   HPI: 65 y.o.   Married  Caucasian  female   G2P2002 with Patient's last menstrual period was 01/16/2002.   here for   Pelvic Ultrasound due to family history of ovarian cancer in her sister.  Some right lower quadrant aching as usual.  The was a little more uncomfortable with the ultrasound today. This has been going on since marriage 76 year ago.  States it has been evaluated in many ways.  No real diagnosis.  Had MRI of pelvis 05/16/08: Area to left of the cervix consistent with fibroid. Possible adenomyosis also seen.  Pelvic ultrasound 12/2014 -  Uterus - Fibroids as previously seen - 2.51 cm, 0.92 cm, 0.66 cm, 0.76 cm. EMS - 3.43 mm. Ovaries - normal.  Free fluid - no  GYNECOLOGIC HISTORY: Patient's last menstrual period was 01/16/2002. Contraception:  Post Menopausal  Menopausal hormone therapy:  None Last mammogram: 10/25/15 BIRADS1:neg Last pap smear:   06/24/14 Neg         OB History    Gravida Para Term Preterm AB Living   '2 2 2     2   '$ SAB TAB Ectopic Multiple Live Births                     Patient Active Problem List   Diagnosis Date Noted  . Pes anserine bursitis 08/10/2015  . Rupture of left posterior tibialis tendon 03/25/2015  . OSA (obstructive sleep apnea) 03/05/2015  . Navicular fracture of ankle 01/21/2015  . Sinus infection 12/25/2014  . Uric acid arthropathy 03/23/2014  . High plasma oxalate 02/17/2014  . Nonallopathic lesion of cervical region 12/19/2012  . Fibroids 05/28/2012  . Family history of ovarian cancer 05/28/2012  . Strain of quadriceps muscle 04/17/2012  . Nonallopathic lesion of costovertebral region 01/23/2012  . Left shoulder pain 12/20/2011  . Rotator cuff disorder 09/26/2011  . Bilateral foot pain 09/20/2011  . Arthralgia of multiple joints 07/26/2011  . Arthritis of knee, degenerative 07/26/2011  . Arthritis of hand, degenerative 07/26/2011  . LBP (low back pain) 07/26/2011  . Vitamin B 12 deficiency  03/05/2011  . TMJ (temporomandibular joint syndrome) 03/05/2011  . Pes planus 03/01/2011  . Right knee pain 03/01/2011  . Greater trochanteric bursitis of left hip 03/01/2011  . Nonallopathic lesion of lumbosacral region 01/27/2011  . Obesity 12/29/2010  . Fatigue 12/29/2010  . Allergic rhinitis, cause unspecified   . Chronic pain   . GERD (gastroesophageal reflux disease)   . Sleep apnea     Past Medical History:  Diagnosis Date  . Abnormal uterine bleeding   . Allergic rhinitis, cause unspecified   . Arthritis of knee, right   . Bursitis of left shoulder   . 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 Chronic pain   . Dyspareunia   . Fibroid   . Foot fracture, left 01/2015   and torn tendons  . GERD (gastroesophageal reflux disease)   . HTN (hypertension)   . Kidney stones   . Sleep apnea   . Urinary incontinence     Past Surgical History:  Procedure Laterality Date  .  dilatation and curettage  2000  . CARPAL TUNNEL RELEASE  1998  . dental implants  2010  . ESOPHAGEAL DILATION  2010  . HAMMER TOE SURGERY     rt  . PELVIC LAPAROSCOPY  1990  . ROTATOR CUFF REPAIR Right 09/2011   Dr. Tamera Punt  . ROTATOR CUFF REPAIR Left  11/11/13   Dr. Tamera Punt   . SEPTOPLASTY    . UMBILICAL HERNIA REPAIR  2010    Current Outpatient Prescriptions  Medication Sig Dispense Refill  . AMBULATORY NON FORMULARY MEDICATION Medication Name:Iodine Complex 12.'5mg'$  1 time every fourth day.    . Cholecalciferol (VITAMIN D3) 5000 units CAPS Take 5,000 Units by mouth every other day.     . CHROMIUM POLYNICOTINATE PO Take 600 mcg/day by mouth 2 (two) times daily.     . Cyanocobalamin (B-12) 1000 MCG/ML KIT Inject 1 Dose as directed every 21 ( twenty-one) days. 3 kit 3  . Multiple Vitamin (MULTIVITAMIN) tablet Take 2 tablets by mouth daily.     . Needles & Syringes MISC 1 Can by Does not apply route once a week. Dispense meals for B12 IM injections please thank you 10 each 3  . NON FORMULARY Place  5,000 mcg under the tongue every other day. Med Name:  Methylcobolamin SL    . Probiotic Product (PROBIOTIC DAILY PO) Take 1 capsule by mouth daily. 5 billion    . TURMERIC PO Take by mouth daily.    . vitamin C (ASCORBIC ACID) 500 MG tablet Take by mouth daily.      No current facility-administered medications for this visit.      ALLERGIES: Cefixime; Celebrex [celecoxib]; Gabapentin; Levofloxacin; Other; Penicillins; Robaxin [methocarbamol]; Tomato; Adhesive [tape]; Tapentadol; and Tetracyclines & related  Family History  Problem Relation Age of Onset  . Rheumatologic disease Mother   . Hypertension Mother   . Hypertension Father   . Rheumatologic disease Sister   . Arthritis Sister     --Rheumatoid arthritis  . Ovarian cancer Sister 33  . Ulcerative colitis Brother   . Glaucoma Brother     Social History   Social History  . Marital status: Married    Spouse name: N/A  . Number of children: N/A  . Years of education: N/A   Occupational History  . Retired    Social History Main Topics  . Smoking status: Never Smoker  . Smokeless tobacco: Never Used  . Alcohol use No  . Drug use: No  . Sexual activity: No   Other Topics Concern  . Not on file   Social History Narrative  . No narrative on file    ROS:  Pertinent items are noted in HPI.  PHYSICAL EXAMINATION:    BP 110/66 (BP Location: Right Arm, Patient Position: Sitting, Cuff Size: Large)   Pulse 80   Resp 16   Ht 5' (1.524 m)   Wt 220 lb (99.8 kg)   LMP 01/16/2002   BMI 42.97 kg/m     General appearance: alert, cooperative and appears stated age   Pelvic ultrasound Stable fibroids including left cervical fibroid. Small 6 mm intracervical mass consistent with probable polyp. Normal ovaries.  No free fluid.  ASSESSMENT  FH ovarian cancer. RLQ pain of undetermined etiology. Stable fibroids.  PLAN  Discussion of fibroids, cervical polyps, and signs and symptoms of ovarian cancer.  Reassurance  given regarding ultrasound appearance today. OK to continue with yearly pelvic ultrasounds. Return for annual exam next summer and prn.    An After Visit Summary was printed and given to the patient.  __15____ minutes face to face time of which over 50% was spent in counseling.

## 2015-10-28 NOTE — Progress Notes (Signed)
    Ultrasound findings reviewed.

## 2015-11-04 DIAGNOSIS — M79672 Pain in left foot: Secondary | ICD-10-CM | POA: Diagnosis not present

## 2015-11-08 DIAGNOSIS — M79672 Pain in left foot: Secondary | ICD-10-CM | POA: Diagnosis not present

## 2015-11-11 ENCOUNTER — Ambulatory Visit (INDEPENDENT_AMBULATORY_CARE_PROVIDER_SITE_OTHER): Payer: Medicare Other | Admitting: Family Medicine

## 2015-11-11 ENCOUNTER — Encounter: Payer: Self-pay | Admitting: Family Medicine

## 2015-11-11 VITALS — BP 128/74 | HR 95 | Wt 219.0 lb

## 2015-11-11 DIAGNOSIS — G894 Chronic pain syndrome: Secondary | ICD-10-CM | POA: Diagnosis not present

## 2015-11-11 DIAGNOSIS — M1711 Unilateral primary osteoarthritis, right knee: Secondary | ICD-10-CM

## 2015-11-11 DIAGNOSIS — M999 Biomechanical lesion, unspecified: Secondary | ICD-10-CM

## 2015-11-11 NOTE — Assessment & Plan Note (Signed)
Seen orthopedics and will have replacement.

## 2015-11-11 NOTE — Assessment & Plan Note (Signed)
Patient does have chronic pain. Patient has had negative titers before but I do think it is a seronegative rheumatoid arthritis. We will continue to monitor. Has had some difficulty with her teeth recently. Consider more evaluation to see if there is anything else that can be done for some of her pain. We discussed icing regimen, discussed the over-the-counter medications again, encourage her to continue to stay active. Follow-up again in 4 weeks

## 2015-11-11 NOTE — Progress Notes (Signed)
CC: Follow  Up right knee pain, and..   Worsening right knee pain. Has degenerative arthritic changes of the knee. Patient has bone-on-bone arthritis. Patient has seen orthopedic surgery and is considered surgery next year.  Patient is also having low back pain.It is secondary to some muscle weakness as well as being overweight. Patient's overall has responded fairly well to osteopathic manipulation. Having more upper back discomfort recently. Overall though nothing severe. Not stopping her from activities but still painful almost on a daily basis.  Patient also has a posterior tibialis tear of the left foot. Seems to be continuing to improve slowly. Back in formal physical therapy at this time. Patient states that she seems to be doing relatively well as long she wears the correct shoes.    Past Medical History:  Diagnosis Date  . Abnormal uterine bleeding   . Allergic rhinitis, cause unspecified   . Arthritis of knee, right   . Bursitis of left shoulder   . 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 Chronic pain   . Dyspareunia   . Fibroid   . Foot fracture, left 01/2015   and torn tendons  . GERD (gastroesophageal reflux disease)   . HTN (hypertension)   . Kidney stones   . Sleep apnea   . Urinary incontinence    Past Surgical History:  Procedure Laterality Date  .  dilatation and curettage  2000  . CARPAL TUNNEL RELEASE  1998  . dental implants  2010  . ESOPHAGEAL DILATION  2010  . HAMMER TOE SURGERY     rt  . PELVIC LAPAROSCOPY  1990  . ROTATOR CUFF REPAIR Right 09/2011   Dr. Tamera Punt  . ROTATOR CUFF REPAIR Left 11/11/13   Dr. Tamera Punt   . SEPTOPLASTY    . UMBILICAL HERNIA REPAIR  2010   Social History  Substance Use Topics  . Smoking status: Never Smoker  . Smokeless tobacco: Never Used  . Alcohol use No   Allergies  Allergen Reactions  . Cefixime Swelling  . Celebrex [Celecoxib] Swelling  . Gabapentin Other (See Comments)    Joint pain  . Levofloxacin Other  (See Comments)    Muscle and joint pain  . Other Other (See Comments)    Other Reaction: painful joints TEQUIN. TEQUIN - JOINT AND MUSCLE PAIN  . Penicillins Hives  . Robaxin [Methocarbamol]   . Tomato Hives  . Adhesive [Tape] Rash    Sensitive to EKG leads  . Tapentadol Rash    Sensitive to EKG leads  . Tetracyclines & Related Rash   Family History  Problem Relation Age of Onset  . Rheumatologic disease Mother   . Hypertension Mother   . Hypertension Father   . Rheumatologic disease Sister   . Arthritis Sister     --Rheumatoid arthritis  . Ovarian cancer Sister 32  . Ulcerative colitis Brother   . Glaucoma Brother      Blood pressure 128/74, pulse 95, weight 219 lb (99.3 kg), last menstrual period 01/16/2002, SpO2 96 %. General: No apparent distress alert and oriented x3 mood and affect normal appears ill.  Respiratory: Patient's recent full sentences and does not appear short of breath significant congestion and cough noted + pain to percussion over bilateral frontal sinuses.  Skin: Warm dry intact with no signs of infection or rash  Neuro: Cranial nerves II through XII are intact, neurovascularly intact in all extremities with 2+ DTRs and 2+ pulses.  Antalgic gait  Knee exam shows the  patient does have moderate tenderness over the medial aspect but more so over the Pes anserine area as well. More pain over the medial joint space as well. Crepitus noted. Instability with valgus force.   Back Exam:  Inspection: Unremarkable  Motion: Flexion 35 deg, Extension 15 deg, Side Bending to 15 deg bilaterally,  Rotation to 15 degrees with once again increasing tightness SLR laying: negative straight leg test which is improvement XSLR laying: Negative  Continued have some tenderness in the paraspinal musculature of the lumbar spine.  FABER: Still tight Sensory change: Gross sensation intact to all lumbar and sacral dermatomes.  Reflexes: 2+ at both patellar tendons, 2+ at  achilles tendons, Babinski's downgoing.  Strength at foot  4/5 strength of the right lower extremity compared to the left side Ankle exam still shows severe tenderness to palpation at the insertion of the posterior tibialis. .  OMT findings  Cervical C2 flexed rotated and side bent left  thoracic  T5 extended rotated and side bent right   Lumbar L2 flexed rotated and side bent right L4 flexed rotated and side bent right  Sacrum Left on left    .

## 2015-11-11 NOTE — Assessment & Plan Note (Signed)
Decision today to treat with OMT was based on Physical Exam  After verbal consent patient was treated with , ME, soft tissue techniques in cervical thoracic, lumbar and sacral areas  Patient tolerated the procedure well with improvement in symptoms  Patient given exercises, stretches and lifestyle modifications  See medications in patient instructions if given  Patient will follow up in 4-6  weeks                           

## 2015-11-11 NOTE — Patient Instructions (Addendum)
Good to see you  Ice is your friend I will look into other things but still likely rheumatoid like.  See me again in 4 weeks.

## 2015-11-12 DIAGNOSIS — M79672 Pain in left foot: Secondary | ICD-10-CM | POA: Diagnosis not present

## 2015-11-15 DIAGNOSIS — M79672 Pain in left foot: Secondary | ICD-10-CM | POA: Diagnosis not present

## 2015-11-23 DIAGNOSIS — M79672 Pain in left foot: Secondary | ICD-10-CM | POA: Diagnosis not present

## 2015-12-01 ENCOUNTER — Ambulatory Visit (INDEPENDENT_AMBULATORY_CARE_PROVIDER_SITE_OTHER): Payer: Medicare Other | Admitting: Internal Medicine

## 2015-12-01 ENCOUNTER — Encounter: Payer: Self-pay | Admitting: Internal Medicine

## 2015-12-01 ENCOUNTER — Ambulatory Visit: Payer: Medicare Other | Admitting: Internal Medicine

## 2015-12-01 VITALS — BP 179/84 | HR 92 | Temp 97.6°F | Wt 222.0 lb

## 2015-12-01 DIAGNOSIS — K089 Disorder of teeth and supporting structures, unspecified: Secondary | ICD-10-CM | POA: Diagnosis present

## 2015-12-01 DIAGNOSIS — R509 Fever, unspecified: Secondary | ICD-10-CM

## 2015-12-01 NOTE — Progress Notes (Signed)
RFV: follow up for fuo visit  Patient ID: Kerry [name], female   DOB: [birth year], 65 y.o.   MRN: [id number]  HPI  She was seen in early October for possible fuo. We asked her to do a fever diary. We did test for immunoglobulin deficiency which was normal. Inflammatory markers normal. She comes with her fever diary, none of her recordings are greater than 100F. She does report feeling poorly, flu like when she has dental procedure. She is undergoing removal of screws associated with her failed dental implants. She states that she has not had fever, nightsweats. She continues to do nasal rinses to help nasal congestion.  Outpatient Encounter Prescriptions as of 12/01/2015  Medication Sig  . AMBULATORY NON FORMULARY MEDICATION Medication Name:Iodine Complex 12.12m 1 time every fourth day.  . Cholecalciferol (VITAMIN D3) 5000 units CAPS Take 5,000 Units by mouth every other day.   . Chromium Picolinate 500 MCG CAPS Take 1 capsule by mouth 2 (two) times daily.  . Cyanocobalamin (B-12) 1000 MCG/ML KIT Inject 1 Dose as directed every 21 ( twenty-one) days.  . Multiple Vitamin (MULTIVITAMIN) tablet Take 2 tablets by mouth daily.   . Needles & Syringes MISC 1 Can by Does not apply route once a week. Dispense meals for B12 IM injections please thank you  . NON FORMULARY Place 5,000 mcg under the tongue every other day. Med Name:  Methylcobolamin SL  . Probiotic Product (PROBIOTIC DAILY PO) Take 1 capsule by mouth daily. 5 billion  . TURMERIC PO Take by mouth daily.  . vitamin C (ASCORBIC ACID) 500 MG tablet Take by mouth daily.    No facility-administered encounter medications on file as of 12/01/2015.      Patient Active Problem List   Diagnosis Date Noted  . Pes anserine bursitis 08/10/2015  . Rupture of left posterior tibialis tendon 03/25/2015  . OSA (obstructive sleep apnea) 03/05/2015  . Navicular fracture of ankle 01/21/2015  . Sinus infection 12/25/2014  . Uric acid arthropathy  03/23/2014  . High plasma oxalate 02/17/2014  . Nonallopathic lesion of cervical region 12/19/2012  . Fibroids 05/28/2012  . Family history of ovarian cancer 05/28/2012  . Strain of quadriceps muscle 04/17/2012  . Nonallopathic lesion of costovertebral region 01/23/2012  . Left shoulder pain 12/20/2011  . Rotator cuff disorder 09/26/2011  . Bilateral foot pain 09/20/2011  . Arthralgia of multiple joints 07/26/2011  . Arthritis of knee, degenerative 07/26/2011  . Arthritis of hand, degenerative 07/26/2011  . LBP (low back pain) 07/26/2011  . Vitamin B 12 deficiency 03/05/2011  . TMJ (temporomandibular joint syndrome) 03/05/2011  . Pes planus 03/01/2011  . Right knee pain 03/01/2011  . Greater trochanteric bursitis of left hip 03/01/2011  . Nonallopathic lesion of lumbosacral region 01/27/2011  . Obesity 12/29/2010  . Fatigue 12/29/2010  . Allergic rhinitis, cause unspecified   . Chronic pain   . GERD (gastroesophageal reflux disease)   . Sleep apnea      Health Maintenance Due  Topic Date Due  . Hepatitis C Screening  [birth year] . PNA vac Low Risk Adult (1 of 2 - PCV13) 10/18/2013  . INFLUENZA VACCINE  08/17/2015     Review of Systems Occasionally feels poorly after havingdental work, no fever, chills, nightsweats. Occasional malaise.  Physical Exam   Wt 222 lb (100.7 kg)   LMP 01/16/2002   BMI 43.36 kg/m  gen = a x o by 3 in NAD HEENT = clear oral pharynx.no abscessed tooth  CBC Lab Results  Component Value Date   WBC 7.3 12/24/2014   RBC 4.48 12/24/2014   HGB 13.7 12/24/2014   HCT 41.4 12/24/2014   PLT 243.0 12/24/2014   MCV 92.6 12/24/2014   MCH 29.9 07/05/2011   MCHC 33.1 12/24/2014   RDW 15.4 12/24/2014   LYMPHSABS 2.2 12/24/2014   MONOABS 0.5 12/24/2014   EOSABS 0.3 12/24/2014   BASOSABS 0.0 12/24/2014   BMET Lab Results  Component Value Date   NA 142 12/24/2014   K 4.5 12/24/2014   CL 104 12/24/2014   CO2 29 12/24/2014   GLUCOSE 98  12/24/2014   BUN 16 12/24/2014   CREATININE 0.76 12/24/2014   CALCIUM 9.5 12/24/2014     Assessment and Plan  FUO = no recorded fevers, but suspect that she may have peridontal disease. Wonder if she gets transient symptoms after dental work. Will need to touch base with dentist to heartheir impression if it is worth getting biopsy or sample to if need to look for jaw osteo. Thus far inflammatory markers are normal.

## 2015-12-02 DIAGNOSIS — M79672 Pain in left foot: Secondary | ICD-10-CM | POA: Diagnosis not present

## 2015-12-11 NOTE — Progress Notes (Signed)
CC: Follow  Up right knee pain, and..   Worsening right knee pain. Has degenerative arthritic changes of the knee. Patient has bone-on-bone arthritis. Patient has seen orthopedic surgery and is considered surgery next year. Still considering it.  Patient is also having low back pain.It is secondary to some muscle weakness as well as being overweight. We have attempted conservative therapy but patient's other underlying problems likely contribute. Patient states overall seems to be doing relatively well. Patient states that the osteopathic manipulation is helpful. Patient also has a posterior tibialis tear of the left foot. Has continue conservative therapy    Past Medical History:  Diagnosis Date  . Abnormal uterine bleeding   . Allergic rhinitis, cause unspecified   . Arthritis of knee, right   . Bursitis of left shoulder   . 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 Chronic pain   . Dyspareunia   . Fibroid   . Foot fracture, left 01/2015   and torn tendons  . GERD (gastroesophageal reflux disease)   . HTN (hypertension)   . Kidney stones   . Sleep apnea   . Urinary incontinence    Past Surgical History:  Procedure Laterality Date  .  dilatation and curettage  2000  . CARPAL TUNNEL RELEASE  1998  . dental implants  2010  . ESOPHAGEAL DILATION  2010  . HAMMER TOE SURGERY     rt  . PELVIC LAPAROSCOPY  1990  . ROTATOR CUFF REPAIR Right 09/2011   Dr. Tamera Punt  . ROTATOR CUFF REPAIR Left 11/11/13   Dr. Tamera Punt   . SEPTOPLASTY    . UMBILICAL HERNIA REPAIR  2010   Social History  Substance Use Topics  . Smoking status: Never Smoker  . Smokeless tobacco: Never Used  . Alcohol use No   Allergies  Allergen Reactions  . Cefixime Swelling  . Celebrex [Celecoxib] Swelling  . Gabapentin Other (See Comments)    Joint pain  . Levofloxacin Other (See Comments)    Muscle and joint pain  . Other Other (See Comments)    Other Reaction: painful joints TEQUIN. TEQUIN - JOINT AND  MUSCLE PAIN  . Penicillins Hives  . Robaxin [Methocarbamol]   . Tomato Hives  . Adhesive [Tape] Rash    Sensitive to EKG leads  . Tapentadol Rash    Sensitive to EKG leads  . Tetracyclines & Related Rash   Family History  Problem Relation Age of Onset  . Rheumatologic disease Mother   . Hypertension Mother   . Hypertension Father   . Rheumatologic disease Sister   . Arthritis Sister     --Rheumatoid arthritis  . Ovarian cancer Sister 58  . Ulcerative colitis Brother   . Glaucoma Brother      Blood pressure (!) 152/84, pulse (!) 109, weight 226 lb (102.5 kg), last menstrual period 01/16/2002, SpO2 97 %. Systems examined below as of 12/14/15 General: NAD A&O x3 mood, affect normal  HEENT: Pupils equal, extraocular movements intact no nystagmus Respiratory: not short of breath at rest or with speaking Cardiovascular: No lower extremity edema, non tender Skin: Warm dry intact with no signs of infection or rash on extremities or on axial skeleton. Abdomen: Soft nontender, no masses Neuro: Cranial nerves  intact, neurovascularly intact in all extremities with 2+ DTRs and 2+ pulses. Lymph: No lymphadenopathy appreciated today   Antalgic gait still noted favoring the left leg Knee exam shows the patient does have moderate tenderness over the medial aspect increase instability  with valgus force   Back Exam:  Inspection: Unremarkable  Motion: Flexion 35 deg, Extension 25 deg, Side Bending to 15 deg bilaterally,  Rotation to 15 degrees SLR laying: negative straight leg test which is improvement XSLR laying: Negative  Continued have some tenderness in the paraspinal musculature of the lumbar spine.  FABER: Still tight Sensory change: Gross sensation intact to all lumbar and sacral dermatomes.  Reflexes: 2+ at both patellar tendons, 2+ at achilles tendons, Babinski's downgoing.  Strength at foot  4/5 strength of the right lower extremity compared to the left side Ankle  exam still shows severe tenderness to palpation at the insertion of the posterior tibialis.   OMT findings  Cervical C2 flexed rotated and side bent left C4 flexed rotated and side bent right  thoracic  T5 extended rotated and side bent right   Lumbar L2 flexed rotated and side bent right L5 flexed rotated and side bent right  Sacrum Left on left    .

## 2015-12-14 ENCOUNTER — Ambulatory Visit (INDEPENDENT_AMBULATORY_CARE_PROVIDER_SITE_OTHER): Payer: Medicare Other | Admitting: Family Medicine

## 2015-12-14 ENCOUNTER — Encounter: Payer: Self-pay | Admitting: Family Medicine

## 2015-12-14 VITALS — BP 152/84 | HR 109 | Wt 226.0 lb

## 2015-12-14 DIAGNOSIS — M545 Low back pain, unspecified: Secondary | ICD-10-CM

## 2015-12-14 DIAGNOSIS — M999 Biomechanical lesion, unspecified: Secondary | ICD-10-CM | POA: Diagnosis not present

## 2015-12-14 DIAGNOSIS — G8929 Other chronic pain: Secondary | ICD-10-CM

## 2015-12-14 MED ORDER — DOXYCYCLINE HYCLATE 100 MG PO TABS: 100.0000 mg | ORAL_TABLET | Freq: Two times a day (BID) | ORAL | 0 refills | Status: AC

## 2015-12-14 MED ORDER — B-12 1000 MCG/ML IJ KIT: 1.0000 | PACK | INTRAMUSCULAR | 3 refills | Status: DC

## 2015-12-14 NOTE — Assessment & Plan Note (Signed)
Decision today to treat with OMT was based on Physical Exam  After verbal consent patient was treated with , ME, soft tissue techniques in cervical thoracic, lumbar and sacral areas  Patient tolerated the procedure well with improvement in symptoms  Patient given exercises, stretches and lifestyle modifications  See medications in patient instructions if given  Patient will follow up in 4-6  weeks                           

## 2015-12-14 NOTE — Assessment & Plan Note (Signed)
Patient continues to have back pain. Does have known arthritic changes. His multiple arthritic problems with other joints. We discussed patient's ergonomics, posture, weight loss affecting some of this pain. We discussed with patient at great length. Patient wants to continue with conservative therapy. Patient will see me again in 4 weeks for further evaluation and interpreted.

## 2015-12-14 NOTE — Patient Instructions (Addendum)
Good to see you  Happy holidays!  Doxycycline If you want for 2 weeks.  If pain in chest or reflux comes back with activity then please call me or your heart doc.  I hope you start feeling better soon.  See me again in 4 weeks.

## 2015-12-15 DIAGNOSIS — L570 Actinic keratosis: Secondary | ICD-10-CM | POA: Diagnosis not present

## 2015-12-28 DIAGNOSIS — M2140 Flat foot [pes planus] (acquired), unspecified foot: Secondary | ICD-10-CM | POA: Diagnosis not present

## 2016-01-12 NOTE — Telephone Encounter (Signed)
error 

## 2016-01-17 HISTORY — PX: CATARACT EXTRACTION W/ INTRAOCULAR LENS IMPLANT: SHX1309

## 2016-01-19 ENCOUNTER — Encounter: Payer: Self-pay | Admitting: Family Medicine

## 2016-01-19 ENCOUNTER — Ambulatory Visit (INDEPENDENT_AMBULATORY_CARE_PROVIDER_SITE_OTHER): Payer: Medicare Other | Admitting: Family Medicine

## 2016-01-19 VITALS — BP 148/86 | HR 97 | Ht 60.0 in | Wt 227.0 lb

## 2016-01-19 DIAGNOSIS — M1711 Unilateral primary osteoarthritis, right knee: Secondary | ICD-10-CM

## 2016-01-19 DIAGNOSIS — G8929 Other chronic pain: Secondary | ICD-10-CM | POA: Diagnosis not present

## 2016-01-19 DIAGNOSIS — M545 Low back pain, unspecified: Secondary | ICD-10-CM

## 2016-01-19 DIAGNOSIS — G894 Chronic pain syndrome: Secondary | ICD-10-CM | POA: Diagnosis not present

## 2016-01-19 DIAGNOSIS — M999 Biomechanical lesion, unspecified: Secondary | ICD-10-CM | POA: Diagnosis not present

## 2016-01-19 NOTE — Assessment & Plan Note (Signed)
As stated above. Encouraged trial of tart Cherry extract

## 2016-01-19 NOTE — Assessment & Plan Note (Signed)
Decision today to treat with OMT was based on Physical Exam  After verbal consent patient was treated with , ME, soft tissue techniques in cervical thoracic, lumbar and sacral areas  Patient tolerated the procedure well with improvement in symptoms  Patient given exercises, stretches and lifestyle modifications  See medications in patient instructions if given  Patient will follow up in 6  weeks

## 2016-01-19 NOTE — Patient Instructions (Addendum)
Great to see you  Kerry [name] is your friend.  I think the orthotic would be a good idea Try the [place].  Check your blood pressure only 2 times a week.  Tart cherry extract any dose at night Continue to decrease Dr. Sharol Roussel medicine.  I hope you get the teeth sorted out.  Call me if you want an antibiotic to take before procedure.  See me again in 6 weeks.  Happy New Year!

## 2016-01-19 NOTE — Progress Notes (Signed)
CC: Follow  Up right knee pain, and..   Worsening right knee pain. Patient was to have replacement but has not longer doing it at this moment because patient's husband is going to have shoulder replacement surgery..  Patient is also having low back pain.It is secondary to some muscle weakness as well as being overweight. We had been doing conservative therapy. Has responded fairly well to osteopathic manipulation. Seems to be doing relatively well at this moment. Some mild increase in stiffness and tightness. Never without some type of discomfort she states.  Patient is continuing to have trouble with her left ankle. Patient is being fitted for custom orthotics at another facility. Patient does have the rupture of the posterior tibialis tendon and is being treated conservatively at this time.   Past Medical History:  Diagnosis Date  . Abnormal uterine bleeding   . Allergic rhinitis, cause unspecified   . Arthritis of knee, right   . Bursitis of left shoulder   . 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 Chronic pain   . Dyspareunia   . Fibroid   . Foot fracture, left 01/2015   and torn tendons  . GERD (gastroesophageal reflux disease)   . HTN (hypertension)   . Kidney stones   . Sleep apnea   . Urinary incontinence    Past Surgical History:  Procedure Laterality Date  .  dilatation and curettage  2000  . CARPAL TUNNEL RELEASE  1998  . dental implants  2010  . ESOPHAGEAL DILATION  2010  . HAMMER TOE SURGERY     rt  . PELVIC LAPAROSCOPY  1990  . ROTATOR CUFF REPAIR Right 09/2011   Dr. Tamera Punt  . ROTATOR CUFF REPAIR Left 11/11/13   Dr. Tamera Punt   . SEPTOPLASTY    . UMBILICAL HERNIA REPAIR  2010   Social History  Substance Use Topics  . Smoking status: Never Smoker  . Smokeless tobacco: Never Used  . Alcohol use No   Allergies  Allergen Reactions  . Cefixime Swelling  . Celebrex [Celecoxib] Swelling  . Gabapentin Other (See Comments)    Joint pain  . Levofloxacin Other  (See Comments)    Muscle and joint pain  . Other Other (See Comments)    Other Reaction: painful joints TEQUIN. TEQUIN - JOINT AND MUSCLE PAIN  . Penicillins Hives  . Robaxin [Methocarbamol]   . Tomato Hives  . Adhesive [Tape] Rash    Sensitive to EKG leads  . Tapentadol Rash    Sensitive to EKG leads  . Tetracyclines & Related Rash   Family History  Problem Relation Age of Onset  . Rheumatologic disease Mother   . Hypertension Mother   . Hypertension Father   . Rheumatologic disease Sister   . Arthritis Sister     --Rheumatoid arthritis  . Ovarian cancer Sister 16  . Ulcerative colitis Brother   . Glaucoma Brother    Review of Systems: No , visual changes, nausea, vomiting, dizziness, abdominal pain, skin rash, fevers, chills, night sweats, weight loss, swollen lymph nodes, chest pain, shortness of breath, mood changes.    Blood pressure (!) 148/86, pulse 97, height 5' (1.524 m), weight 227 lb (103 kg), last menstrual period 01/16/2002, SpO2 97 %.   Systems examined below as of 01/19/16 General: NAD A&O x3 mood, affect normal  HEENT: Pupils equal, extraocular movements intact no nystagmus Respiratory: not short of breath at rest or with speaking Cardiovascular: No lower extremity edema, non tender Skin: Warm  dry intact with no signs of infection or rash on extremities or on axial skeleton. Abdomen: Soft nontender, no masses Neuro: Cranial nerves  intact, neurovascularly intact in all extremities with 2+ DTRs and 2+ pulses. Lymph: No lymphadenopathy appreciated today  Gait antalgic gait secondary to patient's left ankle as well as right knee. MSK: Non tender with full range of motion and good stability and symmetric strength and tone of shoulders, elbows, wrist,  knee hips and ankles bilaterally.  Arthritic changes of multiple joints.  Back Exam:  Inspection: Unremarkable  Motion: Flexion 35 deg, Extension 25 deg, Side Bending to 15 deg bilaterally,  Rotation to 15  degrees SLR laying: negative straight leg test  XSLR laying: Negative  Continues to have some discomfort. Seems to be more paraspinal musculature of the lumbar spine as well as over the sacroiliac joint bilaterally. FABER: Still tight bilaterally. Sensory change: Gross sensation intact to all lumbar and sacral dermatomes.  Reflexes: 2+ at both patellar tendons, 2+ at achilles tendons, Babinski's downgoing.  Strength at foot  Ankle exam still shows severe tenderness to palpation at the insertion of the posterior tibialis. No significant changes except for possibly some mild weakness   Osteopathic findings Cervical C2 flexed rotated and side bent right C4 flexed rotated and side bent left C6 flexed rotated and side bent left T3 extended rotated and side bent right inhaled third rib T9 extended rotated and side bent left L2 flexed rotated and side bent right Sacrum right on right     .

## 2016-01-19 NOTE — Assessment & Plan Note (Signed)
Patient likely continues to have significant discomfort of the lower back. There is a good chance the patient is a seronegative rheumatoid arthritis. Still not starting the medications. We discussed trying to work with a rheumatologist patient has declined at this moment. Discussed icing regimen, discussed the importance of weight loss and staying active. Patient hasn't numerous other health problems at the moment that is making it difficult to focus and will be having other interventions in the near future. Patient will be following up with me again in 6.

## 2016-01-19 NOTE — Assessment & Plan Note (Signed)
Need replacement

## 2016-01-24 ENCOUNTER — Ambulatory Visit: Payer: Medicare Other | Admitting: Family Medicine

## 2016-01-26 DIAGNOSIS — G4733 Obstructive sleep apnea (adult) (pediatric): Secondary | ICD-10-CM | POA: Diagnosis not present

## 2016-01-26 DIAGNOSIS — E785 Hyperlipidemia, unspecified: Secondary | ICD-10-CM | POA: Diagnosis not present

## 2016-01-26 DIAGNOSIS — Z6841 Body Mass Index (BMI) 40.0 and over, adult: Secondary | ICD-10-CM | POA: Diagnosis not present

## 2016-01-26 DIAGNOSIS — R002 Palpitations: Secondary | ICD-10-CM | POA: Diagnosis not present

## 2016-01-26 DIAGNOSIS — R0609 Other forms of dyspnea: Secondary | ICD-10-CM | POA: Diagnosis not present

## 2016-02-08 DIAGNOSIS — M25572 Pain in left ankle and joints of left foot: Secondary | ICD-10-CM | POA: Diagnosis not present

## 2016-02-08 DIAGNOSIS — G8929 Other chronic pain: Secondary | ICD-10-CM | POA: Diagnosis not present

## 2016-02-25 ENCOUNTER — Other Ambulatory Visit (INDEPENDENT_AMBULATORY_CARE_PROVIDER_SITE_OTHER): Payer: Medicare Other

## 2016-02-25 ENCOUNTER — Ambulatory Visit: Payer: Self-pay | Admitting: Family Medicine

## 2016-02-25 ENCOUNTER — Encounter: Payer: Self-pay | Admitting: Family Medicine

## 2016-02-25 ENCOUNTER — Ambulatory Visit (INDEPENDENT_AMBULATORY_CARE_PROVIDER_SITE_OTHER): Payer: Medicare Other | Admitting: Family Medicine

## 2016-02-25 VITALS — BP 150/100 | HR 92 | Ht 60.0 in | Wt 231.0 lb

## 2016-02-25 DIAGNOSIS — M999 Biomechanical lesion, unspecified: Secondary | ICD-10-CM

## 2016-02-25 DIAGNOSIS — M2550 Pain in unspecified joint: Secondary | ICD-10-CM | POA: Diagnosis not present

## 2016-02-25 DIAGNOSIS — S92255G Nondisplaced fracture of navicular [scaphoid] of left foot, subsequent encounter for fracture with delayed healing: Secondary | ICD-10-CM

## 2016-02-25 DIAGNOSIS — M1711 Unilateral primary osteoarthritis, right knee: Secondary | ICD-10-CM | POA: Diagnosis not present

## 2016-02-25 LAB — SEDIMENTATION RATE: Sed Rate: 35 mm/hr — ABNORMAL HIGH (ref 0–30)

## 2016-02-25 LAB — CK: CK TOTAL: 72 U/L (ref 7–177)

## 2016-02-25 NOTE — Patient Instructions (Signed)
Good to see you  Lets get labs to make sure everything is good.  You are doing great keep up with the tart cherry extraact  Call me if knee is worse and we can inject it Stop the one exercise for your foot.  See me again in 3-6 weeks

## 2016-02-26 NOTE — Assessment & Plan Note (Signed)
Decision today to treat with OMT was based on Physical Exam  After verbal consent patient was treated with HVLA, ME, FPR techniques in cervical, thoracic, lumbar and sacral areas  Patient tolerated the procedure well with improvement in symptoms  Patient given exercises, stretches and lifestyle modifications  See medications in patient instructions if given  Patient will follow up in 4 weeks 

## 2016-02-26 NOTE — Assessment & Plan Note (Signed)
Patient wanted to hold on another injection at this time. We discussed we can repeat

## 2016-02-26 NOTE — Assessment & Plan Note (Signed)
Stable. Seen another provider at this time.

## 2016-02-26 NOTE — Progress Notes (Signed)
CC: Follow  Up right knee pain, and..   Worsening right knee pain. Still attempting to get a knee replacement at some point in the near future. Unable to do it with her husband recently having surgery. Does not have any type of injection at the moment. Does have increasing instability and pain though.  Patient is also having low back pain.It is secondary to some muscle weakness as well as being overweight. We had been doing conservative therapy. Has responded fairly well to osteopathic manipulation. Continues to have pain overall. Discussed with patient at great length. Patient states that it still severe. Has not been able to take care of herself secondary to her husband recently had a surgery.  Patient is continuing to have trouble with her left ankle. Still awaiting custom orthotics. Patient states that she is trying to be more active. States that it is not as severe as what it was previously. May be making some improvement.  Past Medical History:  Diagnosis Date  . Abnormal uterine bleeding   . Allergic rhinitis, cause unspecified   . Arthritis of knee, right   . Bursitis of left shoulder   . 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 Chronic pain   . Dyspareunia   . Fibroid   . Foot fracture, left 01/2015   and torn tendons  . GERD (gastroesophageal reflux disease)   . HTN (hypertension)   . Kidney stones   . Sleep apnea   . Urinary incontinence    Past Surgical History:  Procedure Laterality Date  .  dilatation and curettage  2000  . CARPAL TUNNEL RELEASE  1998  . dental implants  2010  . ESOPHAGEAL DILATION  2010  . HAMMER TOE SURGERY     rt  . PELVIC LAPAROSCOPY  1990  . ROTATOR CUFF REPAIR Right 09/2011   Dr. Tamera Punt  . ROTATOR CUFF REPAIR Left 11/11/13   Dr. Tamera Punt   . SEPTOPLASTY    . UMBILICAL HERNIA REPAIR  2010   Social History  Substance Use Topics  . Smoking status: Never Smoker  . Smokeless tobacco: Never Used  . Alcohol use No   Allergies  Allergen  Reactions  . Cefixime Swelling  . Celebrex [Celecoxib] Swelling  . Gabapentin Other (See Comments)    Joint pain  . Levofloxacin Other (See Comments)    Muscle and joint pain  . Other Other (See Comments)    Other Reaction: painful joints TEQUIN. TEQUIN - JOINT AND MUSCLE PAIN  . Penicillins Hives  . Robaxin [Methocarbamol]   . Tomato Hives  . Adhesive [Tape] Rash    Sensitive to EKG leads  . Tapentadol Rash    Sensitive to EKG leads  . Tetracyclines & Related Rash   Family History  Problem Relation Age of Onset  . Rheumatologic disease Mother   . Hypertension Mother   . Hypertension Father   . Rheumatologic disease Sister   . Arthritis Sister     --Rheumatoid arthritis  . Ovarian cancer Sister 11  . Ulcerative colitis Brother   . Glaucoma Brother    Review of Systems: No , visual changes, nausea, vomiting, dizziness, abdominal pain, skin rash, fevers, chills, night sweats, weight loss, swollen lymph nodes, chest pain, shortness of breath, mood changes.    Blood pressure (!) 150/100, pulse 92, height 5' (1.524 m), weight 231 lb (104.8 kg), last menstrual period 01/16/2002.   Systems examined below as of 02/25/16 General: NAD A&O x3 mood, affect normal  HEENT:  Pupils equal, extraocular movements intact no nystagmus Respiratory: not short of breath at rest or with speaking Cardiovascular: No lower extremity edema, non tender Skin: Warm dry intact with no signs of infection or rash on extremities or on axial skeleton. Abdomen: Soft nontender, no masses Neuro: Cranial nerves  intact, neurovascularly intact in all extremities with 2+ DTRs and 2+ pulses. Lymph: No lymphadenopathy appreciated today  Gait antalgic gait MSK: tender with full range of motion and good stability and symmetric strength and tone of shoulders, elbows, wrist,  hips and ankles bilaterally. Arthritic changes of multiple joints   Back Exam:  Inspection: mild increase loss of lordosis Motion: Flexion 35  deg, Extension 15 deg, Side Bending to 15 deg bilaterally,  Rotation to 15 degrees mild increasing tightness SLR laying: negative straight leg test  XSLR laying: Negative  Continues to have some discomfort. Seems to be more paraspinal musculature of the lumbar spine as well as over the sacroiliac joint bilaterally. FABER: Still tight bilaterally. Sensory change: Gross sensation intact to all lumbar and sacral dermatomes.  Reflexes: 2+ at both patellar tendons, 2+ at achilles tendons, Babinski's downgoing.  Strength at foot  Ankle exam shows the patient does not have as severe amount of pain as usual over the posterior tibialis area of the navicular bone. Still does not have full range of motion.  Knee:right valgus deformity noted. Large thigh to calf ratio.  Tender to palpation over medial and PF joint line.  ROM full in flexion and extension and lower leg rotation. instability with valgus force.  painful patellar compression. Patellar glide with moderate crepitus. Patellar and quadriceps tendons unremarkable. Hamstring and quadriceps strength is normal. Contralateral knee shows arthritic changes but no instability  Osteopathic findings Cervical C2 flexed rotated and side bent right C6 flexed rotated and side bent left T3 extended rotated and side bent right inhaled third rib T9 extended rotated and side bent left L2 flexed rotated and side bent right Sacrum right on right      .

## 2016-02-26 NOTE — Assessment & Plan Note (Signed)
Continues to have arthritic changes of multiple joints. Seems out of proportion for patient's health. Discussed with patient again about seronegative rheumatoid arthritis with a strong family history. Patient will continue to monitor closely. Patient was having some headaches and we did check some labs to make sure nothing else is going on such as a high sedimentation rate. Does respond fairly well to manipulation.

## 2016-03-02 ENCOUNTER — Ambulatory Visit: Payer: Self-pay | Admitting: Family Medicine

## 2016-03-02 ENCOUNTER — Ambulatory Visit: Payer: Medicare Other | Admitting: Family Medicine

## 2016-03-02 DIAGNOSIS — H2512 Age-related nuclear cataract, left eye: Secondary | ICD-10-CM | POA: Diagnosis not present

## 2016-03-02 DIAGNOSIS — H524 Presbyopia: Secondary | ICD-10-CM | POA: Diagnosis not present

## 2016-03-02 DIAGNOSIS — H35373 Puckering of macula, bilateral: Secondary | ICD-10-CM | POA: Diagnosis not present

## 2016-03-02 DIAGNOSIS — H25813 Combined forms of age-related cataract, bilateral: Secondary | ICD-10-CM | POA: Diagnosis not present

## 2016-03-17 ENCOUNTER — Ambulatory Visit: Payer: Medicare Other | Admitting: Family Medicine

## 2016-03-17 DIAGNOSIS — M1712 Unilateral primary osteoarthritis, left knee: Secondary | ICD-10-CM | POA: Diagnosis not present

## 2016-03-17 DIAGNOSIS — M1711 Unilateral primary osteoarthritis, right knee: Secondary | ICD-10-CM | POA: Diagnosis not present

## 2016-03-20 ENCOUNTER — Ambulatory Visit (INDEPENDENT_AMBULATORY_CARE_PROVIDER_SITE_OTHER): Payer: Medicare Other | Admitting: Family Medicine

## 2016-03-20 ENCOUNTER — Encounter: Payer: Self-pay | Admitting: Family Medicine

## 2016-03-20 VITALS — BP 140/82 | HR 98 | Ht 60.0 in | Wt 230.0 lb

## 2016-03-20 DIAGNOSIS — E538 Deficiency of other specified B group vitamins: Secondary | ICD-10-CM | POA: Diagnosis not present

## 2016-03-20 DIAGNOSIS — M999 Biomechanical lesion, unspecified: Secondary | ICD-10-CM | POA: Diagnosis not present

## 2016-03-20 DIAGNOSIS — M2140 Flat foot [pes planus] (acquired), unspecified foot: Secondary | ICD-10-CM

## 2016-03-20 DIAGNOSIS — G8929 Other chronic pain: Secondary | ICD-10-CM | POA: Diagnosis not present

## 2016-03-20 DIAGNOSIS — M545 Low back pain, unspecified: Secondary | ICD-10-CM

## 2016-03-20 MED ORDER — B-12 1000 MCG/ML IJ KIT: 1.0000 | PACK | INTRAMUSCULAR | 3 refills | Status: DC

## 2016-03-20 NOTE — Assessment & Plan Note (Signed)
Continues to be difficult. Patient has multiple problems that are likely contributing. Patient does have severe knee arthritis and doesn't need replacement. Patient is coming under viscous supplementation at this point. Significant arthritis. Patient is obese. Encourage her to continue to potentially stay active. We discussed icing regimen and home exercises. Patient will start to increase activity slowly. Follow-up again in 4 weeks.

## 2016-03-20 NOTE — Progress Notes (Signed)
CC: Follow  Up right knee pain, and..   Worsening right knee pain. Still attempting to get a knee replacement at some point in the near future. Started viscous supplementation on both sides. States feels that her left side is feeling much worse. Did have aspiration of the right side.  Patient is also having low back pain.increasing tightness due to knee pain. Has been walking differently. Patient states that more soreness. No radicular symptoms.    Patient is continuing to have trouble with her left ankle. Patient has tried custom orthotics recently. Having difficulty finding them. Feels that the original orthotics we did a long time ago with the most beneficial and is wondering if she could have some very similar to that again.  Past Medical History:  Diagnosis Date  . Abnormal uterine bleeding   . Allergic rhinitis, cause unspecified   . Arthritis of knee, right   . Bursitis of left shoulder   . 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 Chronic pain   . Dyspareunia   . Fibroid   . Foot fracture, left 01/2015   and torn tendons  . GERD (gastroesophageal reflux disease)   . HTN (hypertension)   . Kidney stones   . Sleep apnea   . Urinary incontinence    Past Surgical History:  Procedure Laterality Date  .  dilatation and curettage  2000  . CARPAL TUNNEL RELEASE  1998  . dental implants  2010  . ESOPHAGEAL DILATION  2010  . HAMMER TOE SURGERY     rt  . PELVIC LAPAROSCOPY  1990  . ROTATOR CUFF REPAIR Right 09/2011   Dr. Tamera Punt  . ROTATOR CUFF REPAIR Left 11/11/13   Dr. Tamera Punt   . SEPTOPLASTY    . UMBILICAL HERNIA REPAIR  2010   Social History  Substance Use Topics  . Smoking status: Never Smoker  . Smokeless tobacco: Never Used  . Alcohol use No   Allergies  Allergen Reactions  . Cefixime Swelling  . Celebrex [Celecoxib] Swelling  . Gabapentin Other (See Comments)    Joint pain  . Levofloxacin Other (See Comments)    Muscle and joint pain  . Other Other (See  Comments)    Other Reaction: painful joints TEQUIN. TEQUIN - JOINT AND MUSCLE PAIN  . Penicillins Hives  . Robaxin [Methocarbamol]   . Tomato Hives  . Adhesive [Tape] Rash    Sensitive to EKG leads  . Tapentadol Rash    Sensitive to EKG leads  . Tetracyclines & Related Rash   Family History  Problem Relation Age of Onset  . Rheumatologic disease Mother   . Hypertension Mother   . Hypertension Father   . Rheumatologic disease Sister   . Arthritis Sister     --Rheumatoid arthritis  . Ovarian cancer Sister 19  . Ulcerative colitis Brother   . Glaucoma Brother    Review of Systems: No headache, visual changes, nausea, vomiting, diarrhea, constipation, dizziness, abdominal pain, skin rash, fevers, chills, night sweats, weight loss, swollen lymph nodes, , chest pain, shortness of breath, mood changes.  + muscle pain, body aches.    Blood pressure 140/82, pulse 98, height 5' (1.524 m), weight 230 lb (104.3 kg), last menstrual period 01/16/2002, SpO2 98 %.   Systems examined below as of 03/20/16 General: NAD A&O x3 mood, affect normal  HEENT: Pupils equal, extraocular movements intact no nystagmus Respiratory: not short of breath at rest or with speaking Cardiovascular: No lower extremity edema, non tender Skin:  Warm dry intact with no signs of infection or rash on extremities or on axial skeleton. Abdomen: Soft nontender, no masses Neuro: Cranial nerves  intact, neurovascularly intact in all extremities with 2+ DTRs and 2+ pulses. Lymph: No lymphadenopathy appreciated today  Gait antalgic gait MSK: tender with full range of motion and good stability and symmetric strength and tone of shoulders, elbows, wrist,  hips and ankles bilaterally. Arthritic changes of multiple joints  Back Exam:  Inspection: mild increase loss of lordosis Motion: Flexion 35 deg, Extension 15 deg, Side Bending to 15 deg bilaterally,  Rotation to 15 degrees mild increasing tightness SLR laying: negative  straight leg test  XSLR laying: Negative  Increasing discomfort of the paraspinal musculature of the lumbar spine record of left FABER: Still tight bilaterally. Sensory change: Gross sensation intact to all lumbar and sacral dermatomes.  Reflexes: 2+ at both patellar tendons, 2+ at achilles tendons, Babinski's downgoing.  Strength at foot  Ankle still has overpronation and still has pain over the posterior tibialis as well as the navicular bone. Splaying between the first and second toes with breakdown of the transverse arch left greater then right.  Knee: Lateral valgus deformity noted. Large thigh to calf ratio.  Tender to palpation over medial and PF joint line.  ROM full in flexion and extension and lower leg rotation. instability with valgus force.  painful patellar compression. Patellar glide with moderate crepitus. Patellar and quadriceps tendons unremarkable. Hamstring and quadriceps strength is normal. Contralateral knee shows   Osteopathic findings Cervical C2 flexed rotated and side bent right C4 flexed rotated and side bent left C6 flexed rotated and side bent left T3 extended rotated and side bent right inhaled third rib T9 extended rotated and side bent left L2 flexed rotated and side bent right Sacrum right on right       .

## 2016-03-20 NOTE — Assessment & Plan Note (Signed)
Discussed with patient. Patient will be referred for an orthotic by another provider.  Discussed shoes importance of weight loss.

## 2016-03-20 NOTE — Assessment & Plan Note (Signed)
Refilled medciation

## 2016-03-20 NOTE — Patient Instructions (Signed)
Good to see you  Increase b12 to 2 times a week ;) Ice is your friend.  Stop the tart cherry  I think the knee injections are good for now.  Dr. Paulla Fore office will call you for orthotics.  See me again in 4 weeks. 30 minute appointment please!

## 2016-03-20 NOTE — Assessment & Plan Note (Signed)
Decision today to treat with OMT was based on Physical Exam  After verbal consent patient was treated with HVLA, ME, FPR techniques in cervical, thoracic, rib lumbar and sacral areas  Patient tolerated the procedure well with improvement in symptoms  Patient given exercises, stretches and lifestyle modifications  See medications in patient instructions if given  Patient will follow up in 4 weeks 

## 2016-03-21 NOTE — Progress Notes (Signed)
Kerry [name] - We can handle it. Marcene Brawn - Will you please call this patient and get her on my schedule at her convenience

## 2016-03-21 NOTE — Progress Notes (Signed)
Yes we are set up.  Eulas Post is actually coming tomorrow to finish up training for [employer] but we can do it at any point.

## 2016-03-21 NOTE — Progress Notes (Signed)
Patient is scheduled to see Dr. Paulla Fore 3/20 @ 1:30p.

## 2016-03-24 DIAGNOSIS — M1711 Unilateral primary osteoarthritis, right knee: Secondary | ICD-10-CM | POA: Diagnosis not present

## 2016-03-24 DIAGNOSIS — M1712 Unilateral primary osteoarthritis, left knee: Secondary | ICD-10-CM | POA: Diagnosis not present

## 2016-03-27 DIAGNOSIS — H25812 Combined forms of age-related cataract, left eye: Secondary | ICD-10-CM | POA: Diagnosis not present

## 2016-03-27 DIAGNOSIS — H2512 Age-related nuclear cataract, left eye: Secondary | ICD-10-CM | POA: Diagnosis not present

## 2016-03-31 DIAGNOSIS — M1712 Unilateral primary osteoarthritis, left knee: Secondary | ICD-10-CM | POA: Diagnosis not present

## 2016-03-31 DIAGNOSIS — M1711 Unilateral primary osteoarthritis, right knee: Secondary | ICD-10-CM | POA: Diagnosis not present

## 2016-04-04 ENCOUNTER — Encounter: Payer: Self-pay | Admitting: Sports Medicine

## 2016-04-04 ENCOUNTER — Ambulatory Visit: Payer: Medicare Other | Admitting: Sports Medicine

## 2016-04-04 ENCOUNTER — Ambulatory Visit (INDEPENDENT_AMBULATORY_CARE_PROVIDER_SITE_OTHER): Payer: Medicare Other | Admitting: Sports Medicine

## 2016-04-04 VITALS — BP 150/84 | HR 87 | Ht 59.5 in | Wt 230.0 lb

## 2016-04-04 DIAGNOSIS — R269 Unspecified abnormalities of gait and mobility: Secondary | ICD-10-CM | POA: Diagnosis not present

## 2016-04-04 DIAGNOSIS — S96812D Strain of other specified muscles and tendons at ankle and foot level, left foot, subsequent encounter: Secondary | ICD-10-CM

## 2016-04-04 DIAGNOSIS — S86112D Strain of other muscle(s) and tendon(s) of posterior muscle group at lower leg level, left leg, subsequent encounter: Secondary | ICD-10-CM

## 2016-04-04 DIAGNOSIS — H2511 Age-related nuclear cataract, right eye: Secondary | ICD-10-CM | POA: Diagnosis not present

## 2016-04-04 NOTE — Progress Notes (Signed)
Kerry [name] - 66 y.o. female MRN [id number]  Date of birth: [birth year]  Office Visit Note: Visit Date: 04/04/2016 PCP: Christa See, MD Referred by: Emmaline Kluver, *  Subjective: Chief Complaint  Patient presents with  . navicular fx    left  . pain in knee    right   HPI: Patient presents for evaluation of the above symptoms.  She is interested in new custom cushion orthotics.  She has had issues with her feet for quite some time.  She previously has responded well to [employer] and is interested in having these today.  The last.  She had was fabricated at the [facility] and these have not been modified.  She had a difficult time fitting these inside of her shoes and is avoid wearing them due to discomfort at the time.  They are otherwise good shape.  ROS:  Otherwise per HPI.  Objective:  VS:  HT:4' 11.5" (151.1 cm)   WT:230 lb (104.3 kg)  BMI:45.8    BP:(!) 150/84  HR:87bpm  TEMP: ( )  RESP:97 % Physical Exam: GENERAL:  WDWN, NAD, Non-toxic appearing PSYCH:  Alert & appropriately interactive  Not depressed or anxious appearing Lower extremities:  Large legs.  She walks with an antalgic gait.  She has marked supination with ambulation. She is in calcaneal varus with weightbearing Flexible high arch with transverse arch breakdown. Slight equinus contracture at 90 on the left and 95 on the right.    Imaging & Procedures: No results found.  PROCEDURE: Custom orthotic modifications This was a 75 minute visit spent in direct one-on-one patient evaluation and treatment.  Ultimately her FASTEC orthotics were reground with a new EVA base.  These are ground to have a varus wedge hindfoot and a small  Long metatarsal pad was added to the forefoot.  Patient reported overall significant improvement in her discomfort with these.  Additionally her shoes were modified and the midfoot was removed with scissors along the medial aspect  which was causing some compression of the bony prominence on her first MTP.  Assessment & Plan: Problem List Items Addressed This Visit    Abnormal gait    Extensive time was spent modifying the patient's [place]. I am happy to see her back if any further modifications need to be made.      Rupture of left posterior tibialis tendon - Primary      Follow-up: No Follow-up on file.   Past Medical/Family/Surgical/Social History: Medications & Allergies reviewed per EMR Patient Active Problem List   Diagnosis Date Noted  . Abnormal gait 04/10/2016  . Pes anserine bursitis 08/10/2015  . Rupture of left posterior tibialis tendon 03/25/2015  . OSA (obstructive sleep apnea) 03/05/2015  . Navicular fracture of ankle 01/21/2015  . Sinus infection 12/25/2014  . Uric acid arthropathy 03/23/2014  . High plasma oxalate 02/17/2014  . Nonallopathic lesion of cervical region 12/19/2012  . Fibroids 05/28/2012  . Family history of ovarian cancer 05/28/2012  . Strain of quadriceps muscle 04/17/2012  . Nonallopathic lesion of costovertebral region 01/23/2012  . Left shoulder pain 12/20/2011  . Rotator cuff disorder 09/26/2011  . Bilateral foot pain 09/20/2011  . Arthralgia of multiple joints 07/26/2011  . Arthritis of knee, degenerative 07/26/2011  . Arthritis of hand, degenerative 07/26/2011  . Low back pain 07/26/2011  . Vitamin B 12 deficiency 03/05/2011  . TMJ (temporomandibular joint syndrome) 03/05/2011  . Pes planus 03/01/2011  . Right  knee pain 03/01/2011  . Greater trochanteric bursitis of left hip 03/01/2011  . Nonallopathic lesion of lumbosacral region 01/27/2011  . Obesity 12/29/2010  . Fatigue 12/29/2010  . Allergic rhinitis, cause unspecified   . Chronic pain   . GERD (gastroesophageal reflux disease)   . Sleep apnea    Past Medical History:  Diagnosis Date  . Abnormal uterine bleeding   . Allergic rhinitis, cause unspecified   . Arthritis of knee, right   .  Bursitis of left shoulder   . 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 Chronic pain   . Dyspareunia   . Fibroid   . Foot fracture, left 01/2015   and torn tendons  . GERD (gastroesophageal reflux disease)   . HTN (hypertension)   . Kidney stones   . Sleep apnea   . Urinary incontinence    Family History  Problem Relation Age of Onset  . Rheumatologic disease Mother   . Hypertension Mother   . Hypertension Father   . Rheumatologic disease Sister   . Arthritis Sister     --Rheumatoid arthritis  . Ovarian cancer Sister 49  . Ulcerative colitis Brother   . Glaucoma Brother    Past Surgical History:  Procedure Laterality Date  .  dilatation and curettage  2000  . CARPAL TUNNEL RELEASE  1998  . dental implants  2010  . ESOPHAGEAL DILATION  2010  . HAMMER TOE SURGERY     rt  . PELVIC LAPAROSCOPY  1990  . ROTATOR CUFF REPAIR Right 09/2011   Dr. Tamera Punt  . ROTATOR CUFF REPAIR Left 11/11/13   Dr. Tamera Punt   . SEPTOPLASTY    . UMBILICAL HERNIA REPAIR  2010   Social History   Occupational History  . Retired    Social History Main Topics  . Smoking status: Never Smoker  . Smokeless tobacco: Never Used  . Alcohol use No  . Drug use: No  . Sexual activity: No

## 2016-04-10 DIAGNOSIS — H25811 Combined forms of age-related cataract, right eye: Secondary | ICD-10-CM | POA: Diagnosis not present

## 2016-04-10 DIAGNOSIS — H2511 Age-related nuclear cataract, right eye: Secondary | ICD-10-CM | POA: Diagnosis not present

## 2016-04-10 DIAGNOSIS — R269 Unspecified abnormalities of gait and mobility: Secondary | ICD-10-CM | POA: Insufficient documentation

## 2016-04-10 NOTE — Assessment & Plan Note (Signed)
Extensive time was spent modifying the patient's [place]. I am happy to see her back if any further modifications need to be made.

## 2016-04-13 DIAGNOSIS — M1711 Unilateral primary osteoarthritis, right knee: Secondary | ICD-10-CM | POA: Diagnosis not present

## 2016-04-13 DIAGNOSIS — M1712 Unilateral primary osteoarthritis, left knee: Secondary | ICD-10-CM | POA: Diagnosis not present

## 2016-04-18 ENCOUNTER — Ambulatory Visit (INDEPENDENT_AMBULATORY_CARE_PROVIDER_SITE_OTHER): Payer: Medicare Other | Admitting: Family Medicine

## 2016-04-18 ENCOUNTER — Encounter: Payer: Self-pay | Admitting: Family Medicine

## 2016-04-18 VITALS — BP 158/76 | HR 93 | Wt 230.2 lb

## 2016-04-18 DIAGNOSIS — G8929 Other chronic pain: Secondary | ICD-10-CM | POA: Diagnosis not present

## 2016-04-18 DIAGNOSIS — M545 Low back pain, unspecified: Secondary | ICD-10-CM

## 2016-04-18 DIAGNOSIS — M999 Biomechanical lesion, unspecified: Secondary | ICD-10-CM

## 2016-04-18 NOTE — Assessment & Plan Note (Signed)
Stable. Patient does likely have some degenerative disc disease. Hasn't arthritic changes of multiple joints. We discussed icing regimen and home exercises. Discussed which activities doing which ones to avoid. Encourage patient to increase activity as tolerated. Patient will follow-up with me again 3-4 weeks.

## 2016-04-18 NOTE — Progress Notes (Signed)
CC: Follow  Up right knee pain, and..   Worsening right knee pain. Still attempting to get a knee replacement at some point in the near future. Started viscous supplementation on both sides. States feels that her left side is feeling much worse. Did have aspiration of the right side.  Patient is also having low back pain.continues to feeling she has still little more stiff secondary to her knee pain. Getting viscous supplementation in the knee. Patient states that more of a stiffness.    Patient is continuing to have trouble with her left ankle. Went different orthotics. Doing much better at this time. Patient's incentive changes in the shoes seem to be doing aren't.  Past Medical History:  Diagnosis Date  . Abnormal uterine bleeding   . Allergic rhinitis, cause unspecified   . Arthritis of knee, right   . Bursitis of left shoulder   . 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 Chronic pain   . Dyspareunia   . Fibroid   . Foot fracture, left 01/2015   and torn tendons  . GERD (gastroesophageal reflux disease)   . HTN (hypertension)   . Kidney stones   . Sleep apnea   . Urinary incontinence    Past Surgical History:  Procedure Laterality Date  .  dilatation and curettage  2000  . CARPAL TUNNEL RELEASE  1998  . dental implants  2010  . ESOPHAGEAL DILATION  2010  . HAMMER TOE SURGERY     rt  . PELVIC LAPAROSCOPY  1990  . ROTATOR CUFF REPAIR Right 09/2011   Dr. Tamera Punt  . ROTATOR CUFF REPAIR Left 11/11/13   Dr. Tamera Punt   . SEPTOPLASTY    . UMBILICAL HERNIA REPAIR  2010   Social History  Substance Use Topics  . Smoking status: Never Smoker  . Smokeless tobacco: Never Used  . Alcohol use No   Allergies  Allergen Reactions  . Cefixime Swelling  . Celebrex [Celecoxib] Swelling  . Gabapentin Other (See Comments)    Joint pain  . Levofloxacin Other (See Comments)    Muscle and joint pain  . Other Other (See Comments)    Other Reaction: painful joints TEQUIN. TEQUIN -  JOINT AND MUSCLE PAIN  . Penicillins Hives  . Robaxin [Methocarbamol]   . Tomato Hives  . Adhesive [Tape] Rash    Sensitive to EKG leads  . Tetracycline Hcl Rash    Can take Doxy  . Tetracyclines & Related Rash   Family History  Problem Relation Age of Onset  . Rheumatologic disease Mother   . Hypertension Mother   . Hypertension Father   . Rheumatologic disease Sister   . Arthritis Sister     --Rheumatoid arthritis  . Ovarian cancer Sister 23  . Ulcerative colitis Brother   . Glaucoma Brother    Review of Systems: No headache, visual changes, nausea, vomiting, diarrhea, constipation, dizziness, abdominal pain, skin rash, fevers, chills, night sweats, weight loss, swollen lymph nodes, , chest pain, shortness of breath, mood changes.  + muscle pain, body aches.    Blood pressure (!) 158/76, pulse 93, weight 230 lb 3.2 oz (104.4 kg), last menstrual period 01/16/2002, SpO2 98 %.   Systems examined below as of 04/18/16 General: NAD A&O x3 mood, affect normal  HEENT: Pupils equal, extraocular movements intact no nystagmus Respiratory: not short of breath at rest or with speaking Cardiovascular: No lower extremity edema, non tender Skin: Warm dry intact with no signs of infection or rash  on extremities or on axial skeleton. Abdomen: Soft nontender, no masses Neuro: Cranial nerves  intact, neurovascularly intact in all extremities with 2+ DTRs and 2+ pulses. Lymph: No lymphadenopathy appreciated today  Gait antalgic gait no change MSK: tender with full range of motion and good stability and symmetric strength and tone of shoulders, elbows, wrist,  hips and ankles bilaterally. Arthritic changes of multiple joints  Back Exam:  Inspection: mild increase loss of lordosis Motion: Flexion 35 deg, Extension 15 deg, Side Bending to 15 deg bilaterally,  Rotation to 15 degrees mild increasing tightness SLR laying: negative straight leg test  XSLR laying: Negative  Patient does have  increasing tenderness of the paraspinal musculature of the lumbar spine FABER: Mild increasing tightness from previous exam Sensory change: Gross sensation intact to all lumbar and sacral dermatomes.  Reflexes: 2+ at both patellar tendons, 2+ at achilles tendons, Babinski's downgoing.  Strength at foot  Ankle still has overpronation and still has pain over the posterior tibialis as well as the navicular bone. Splaying between the first and second toes with breakdown of the transverse arch left greater then right.  Knee: Bilateral valgus deformity noted. Large thigh to calf ratio.  Tender to palpation over medial and PF joint line.  ROM full in flexion and extension and lower leg rotation. instability with valgus force.  painful patellar compression. Patellar glide with moderate crepitus. Patellar and quadriceps tendons unremarkable. Hamstring and quadriceps strength is normal. Contralateral knee shows   Osteopathic findings Cervical C2 flexed rotated and side bent right C5 flexed rotated and side bent left T3 extended rotated and side bent right inhaled third rib T9 extended rotated and side bent left L2 flexed rotated and side bent right Sacrum right on right

## 2016-04-18 NOTE — Patient Instructions (Signed)
Good to see you  Ice is your friend when needed if you think Like the idea of the apple cider vinegar.  If not then consider nexium 2 times daily for 1 week.  See me again when you get back but have a great trip

## 2016-04-18 NOTE — Progress Notes (Signed)
Pre-visit discussion using our clinic review tool. No additional management support is needed unless otherwise documented below in the visit note.  

## 2016-04-18 NOTE — Assessment & Plan Note (Signed)
Decision today to treat with OMT was based on Physical Exam  After verbal consent patient was treated with HVLA, ME, FPR techniques in cervical, thoracic, lumbar and sacral areas  Patient tolerated the procedure well with improvement in symptoms  Patient given exercises, stretches and lifestyle modifications  See medications in patient instructions if given  Patient will follow up in 3-4 weeks  

## 2016-04-24 DIAGNOSIS — M1712 Unilateral primary osteoarthritis, left knee: Secondary | ICD-10-CM | POA: Diagnosis not present

## 2016-04-24 DIAGNOSIS — M1711 Unilateral primary osteoarthritis, right knee: Secondary | ICD-10-CM | POA: Diagnosis not present

## 2016-05-04 DIAGNOSIS — D225 Melanocytic nevi of trunk: Secondary | ICD-10-CM | POA: Diagnosis not present

## 2016-05-04 DIAGNOSIS — L308 Other specified dermatitis: Secondary | ICD-10-CM | POA: Diagnosis not present

## 2016-05-04 DIAGNOSIS — D224 Melanocytic nevi of scalp and neck: Secondary | ICD-10-CM | POA: Diagnosis not present

## 2016-05-04 DIAGNOSIS — L814 Other melanin hyperpigmentation: Secondary | ICD-10-CM | POA: Diagnosis not present

## 2016-05-04 DIAGNOSIS — D2271 Melanocytic nevi of right lower limb, including hip: Secondary | ICD-10-CM | POA: Diagnosis not present

## 2016-05-04 DIAGNOSIS — D1801 Hemangioma of skin and subcutaneous tissue: Secondary | ICD-10-CM | POA: Diagnosis not present

## 2016-05-04 DIAGNOSIS — L812 Freckles: Secondary | ICD-10-CM | POA: Diagnosis not present

## 2016-05-04 DIAGNOSIS — D2272 Melanocytic nevi of left lower limb, including hip: Secondary | ICD-10-CM | POA: Diagnosis not present

## 2016-05-04 DIAGNOSIS — L821 Other seborrheic keratosis: Secondary | ICD-10-CM | POA: Diagnosis not present

## 2016-05-30 ENCOUNTER — Encounter: Payer: Self-pay | Admitting: Family Medicine

## 2016-05-30 ENCOUNTER — Ambulatory Visit (INDEPENDENT_AMBULATORY_CARE_PROVIDER_SITE_OTHER): Payer: Medicare Other | Admitting: Family Medicine

## 2016-05-30 VITALS — BP 142/82 | HR 109 | Ht 59.5 in | Wt 233.0 lb

## 2016-05-30 DIAGNOSIS — S92255G Nondisplaced fracture of navicular [scaphoid] of left foot, subsequent encounter for fracture with delayed healing: Secondary | ICD-10-CM | POA: Diagnosis not present

## 2016-05-30 DIAGNOSIS — M545 Low back pain, unspecified: Secondary | ICD-10-CM

## 2016-05-30 DIAGNOSIS — G8929 Other chronic pain: Secondary | ICD-10-CM

## 2016-05-30 DIAGNOSIS — M999 Biomechanical lesion, unspecified: Secondary | ICD-10-CM

## 2016-05-30 NOTE — Progress Notes (Signed)
CC: Follow  Up pain    Worsening right knee pain. Patient has undergone viscous supplementation. Patient feels that the knee is doing relatively well. Wants to hold off any type of replacement until the fall. We'll be considered that in the near future. Still states that he has some instability and inability with a cane  Continues to have low back pain. Patient did travel a significant amount. Feels that that may have exacerbated some of it difficult he. Mild tightness but nothing as severe as what she's had previously sometimes.  Upper back pain. Was holding a toddler. Patient states that the seem to exacerbate some of the difficulty. No numbness or tingling.   Ankle is better. Still pain but not as severe.  Past Medical History:  Diagnosis Date  . Abnormal uterine bleeding   . Allergic rhinitis, cause unspecified   . Arthritis of knee, right   . Bursitis of left shoulder   . 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 Chronic pain   . Dyspareunia   . Fibroid   . Foot fracture, left 01/2015   and torn tendons  . GERD (gastroesophageal reflux disease)   . HTN (hypertension)   . Kidney stones   . Sleep apnea   . Urinary incontinence    Past Surgical History:  Procedure Laterality Date  .  dilatation and curettage  2000  . CARPAL TUNNEL RELEASE  1998  . dental implants  2010  . ESOPHAGEAL DILATION  2010  . HAMMER TOE SURGERY     rt  . PELVIC LAPAROSCOPY  1990  . ROTATOR CUFF REPAIR Right 09/2011   Dr. Tamera Punt  . ROTATOR CUFF REPAIR Left 11/11/13   Dr. Tamera Punt   . SEPTOPLASTY    . UMBILICAL HERNIA REPAIR  2010   Social History  Substance Use Topics  . Smoking status: Never Smoker  . Smokeless tobacco: Never Used  . Alcohol use No   Allergies  Allergen Reactions  . Cefixime Swelling  . Celebrex [Celecoxib] Swelling  . Gabapentin Other (See Comments)    Joint pain  . Levofloxacin Other (See Comments)    Muscle and joint pain  . Other Other (See Comments)    Other  Reaction: painful joints TEQUIN. TEQUIN - JOINT AND MUSCLE PAIN  . Penicillins Hives  . Robaxin [Methocarbamol]   . Tomato Hives  . Adhesive [Tape] Rash    Sensitive to EKG leads  . Tetracycline Hcl Rash    Can take Doxy  . Tetracyclines & Related Rash   Family History  Problem Relation Age of Onset  . Rheumatologic disease Mother   . Hypertension Mother   . Hypertension Father   . Rheumatologic disease Sister   . Arthritis Sister        --Rheumatoid arthritis  . Ovarian cancer Sister 57  . Ulcerative colitis Brother   . Glaucoma Brother    Review of Systems: No headache, visual changes, nausea, vomiting, diarrhea, constipation, dizziness, abdominal pain, skin rash, fevers, chills, night sweats, weight loss, swollen lymph nodes,  chest pain, shortness of breath, mood changes.  Positive muscle aches   Blood pressure (!) 142/82, pulse (!) 109, height 4' 11.5" (1.511 m), weight 233 lb (105.7 kg), last menstrual period 01/16/2002, SpO2 97 %.   Systems examined below as of 05/30/16 General: NAD A&O x3 mood, affect normal  HEENT: Pupils equal, extraocular movements intact no nystagmus Respiratory: not short of breath at rest or with speaking Cardiovascular: No lower extremity edema,  non tender Skin: Warm dry intact with no signs of infection or rash on extremities or on axial skeleton. Abdomen: Soft nontender, no masses Neuro: Cranial nerves  intact, neurovascularly intact in all extremities with 2+ DTRs and 2+ pulses. Lymph: No lymphadenopathy appreciated today  Gait antalgic gait still noted but patient's baseline MSK: mild  tender with full range of motion and good stability and symmetric strength and tone of shoulders, elbows, wrist,  hips and ankles bilaterally. Arthritic changes of multiple joints  Back Exam:  Inspection: mild increase loss of lordosis Motion: Flexion 35 deg, Extension 15 deg, Side Bending to 15 deg bilaterally,  Rotation to 15 degrees mild increasing  tightness SLR laying: negative straight leg test  XSLR laying: Negative  Patient is tender to palpation in the thoracal lumbar junction more on the left sign. Also has some pain on the medial aspect of the left scapula. FABER: Mild increasing tightness from previous exam Sensory change: Gross sensation intact to all lumbar and sacral dermatomes.  Reflexes: 2+ at both patellar tendons, 2+ at achilles tendons, Babinski's downgoing.  Strength at foot   Knee: Bilateral valgus deformity noted. Large thigh to calf ratio.  Tender to palpation over medial and PF joint line.  ROM full in flexion and extension and lower leg rotation. instability with valgus force.  painful patellar compression. Patellar glide with moderate crepitus. Patellar and quadriceps tendons unremarkable. Hamstring and quadriceps strength is normal.  Osteopathic findings C7 flexed rotated and side bent left T3 extended rotated and side bent right inhaled third rib T11 extended rotated and side bent left L3 flexed rotated and side bent right Sacrum right on right

## 2016-05-30 NOTE — Assessment & Plan Note (Signed)
Decision today to treat with OMT was based on Physical Exam  After verbal consent patient was treated with HVLA, ME, FPR techniques in cervical, thoracic, rib lumbar and sacral areas  Patient tolerated the procedure well with improvement in symptoms  Patient given exercises, stretches and lifestyle modifications  See medications in patient instructions if given  Patient will follow up in 4-6 weeks 

## 2016-05-30 NOTE — Assessment & Plan Note (Signed)
Patient has many different comorbidities and muscle pains. Discussed with patient at great length. We discussed icing regimen, encourage weight loss, we discussed which activities to do in which ones to avoid. Patient will try to continue with conservative therapy and see me again in 4-6 weeks for further evaluation and treatment.

## 2016-05-30 NOTE — Patient Instructions (Signed)
Good to see you  Kerry [name] is your friend.  You are doing well for all the traveling.  Try to keep hands within the peripheral vision.  Try to do the pennsaid in the area.  I hope the teeth give [place] some answers See me again in 4-6 weeks.

## 2016-05-30 NOTE — Assessment & Plan Note (Signed)
Stable. In custom orthotics.

## 2016-06-13 DIAGNOSIS — G4733 Obstructive sleep apnea (adult) (pediatric): Secondary | ICD-10-CM | POA: Diagnosis not present

## 2016-06-13 DIAGNOSIS — R0609 Other forms of dyspnea: Secondary | ICD-10-CM | POA: Diagnosis not present

## 2016-06-13 DIAGNOSIS — Z6841 Body Mass Index (BMI) 40.0 and over, adult: Secondary | ICD-10-CM | POA: Diagnosis not present

## 2016-06-13 DIAGNOSIS — R002 Palpitations: Secondary | ICD-10-CM | POA: Diagnosis not present

## 2016-06-13 DIAGNOSIS — E785 Hyperlipidemia, unspecified: Secondary | ICD-10-CM | POA: Diagnosis not present

## 2016-06-28 ENCOUNTER — Ambulatory Visit (INDEPENDENT_AMBULATORY_CARE_PROVIDER_SITE_OTHER): Payer: Medicare Other | Admitting: Family Medicine

## 2016-06-28 ENCOUNTER — Encounter: Payer: Self-pay | Admitting: Family Medicine

## 2016-06-28 VITALS — BP 126/78 | HR 94 | Ht 59.5 in | Wt 234.0 lb

## 2016-06-28 DIAGNOSIS — M999 Biomechanical lesion, unspecified: Secondary | ICD-10-CM

## 2016-06-28 DIAGNOSIS — M545 Low back pain, unspecified: Secondary | ICD-10-CM

## 2016-06-28 DIAGNOSIS — M1711 Unilateral primary osteoarthritis, right knee: Secondary | ICD-10-CM

## 2016-06-28 DIAGNOSIS — M25562 Pain in left knee: Secondary | ICD-10-CM

## 2016-06-28 DIAGNOSIS — G8929 Other chronic pain: Secondary | ICD-10-CM | POA: Diagnosis not present

## 2016-06-28 NOTE — Patient Instructions (Signed)
Good to see you  Kerry [name] is your friend.  I will try to get more info Have a great camping Knees should get a little better See me again in 4-6 weeks

## 2016-06-28 NOTE — Assessment & Plan Note (Signed)
Stable but chronic. Discussed with patient at great length. We discussed icing regimen and home exercises. Difficult to treat with patient having significant number of allergies. Patient does respond somewhat to manipulation. Continue as much can. Follow-up again 4 weeks

## 2016-06-28 NOTE — Assessment & Plan Note (Signed)
Bilateral knee injections given today. Patient tolerated the procedures well. Patient will need replacement in the near future patient quality of life is significantly poorer. Discussed icing regimen and home exercises. Discussed which activities doing which ones to avoid. Encourage patient to increase activity as tolerate it. Follow-up again in 4 weeks.

## 2016-06-28 NOTE — Assessment & Plan Note (Signed)
Decision today to treat with OMT was based on Physical Exam  After verbal consent patient was treated with HVLA, ME, FPR techniques in cervical, thoracic, lumbar and sacral areas  Patient tolerated the procedure well with improvement in symptoms  Patient given exercises, stretches and lifestyle modifications  See medications in patient instructions if given  Patient will follow up in 4 weeks 

## 2016-06-28 NOTE — Progress Notes (Signed)
CC: Follow  Up pain    Bilateral knee pain. Patient has had severe amount of pain in the knees. Does have bone-on-bone osteophytic changes. Patient recently finished Supartz injections in the left knee. Feels that the pain is worse. This is affecting her right knee. Not walking is much secondary to the pain. Affecting daily activities. Increasing instability.  Continues to have low back pain. Has responded somewhat to manipulation. Continues to have a dull throbbing aching pain. Seems to be worse at her knee. Been worse. This then turned Effexor upper back as well.   Ankle is better. Still pain but not as severe.  Past Medical History:  Diagnosis Date  . Abnormal uterine bleeding   . Allergic rhinitis, cause unspecified   . Arthritis of knee, right   . Bursitis of left shoulder   . 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 Chronic pain   . Dyspareunia   . Fibroid   . Foot fracture, left 01/2015   and torn tendons  . GERD (gastroesophageal reflux disease)   . HTN (hypertension)   . Kidney stones   . Sleep apnea   . Urinary incontinence    Past Surgical History:  Procedure Laterality Date  .  dilatation and curettage  2000  . CARPAL TUNNEL RELEASE  1998  . dental implants  2010  . ESOPHAGEAL DILATION  2010  . HAMMER TOE SURGERY     rt  . PELVIC LAPAROSCOPY  1990  . ROTATOR CUFF REPAIR Right 09/2011   Dr. Tamera Punt  . ROTATOR CUFF REPAIR Left 11/11/13   Dr. Tamera Punt   . SEPTOPLASTY    . UMBILICAL HERNIA REPAIR  2010   Social History  Substance Use Topics  . Smoking status: Never Smoker  . Smokeless tobacco: Never Used  . Alcohol use No   Allergies  Allergen Reactions  . Cefixime Swelling  . Celebrex [Celecoxib] Swelling  . Gabapentin Other (See Comments)    Joint pain  . Levofloxacin Other (See Comments)    Muscle and joint pain  . Other Other (See Comments)    Other Reaction: painful joints TEQUIN. TEQUIN - JOINT AND MUSCLE PAIN  . Penicillins Hives  . Robaxin  [Methocarbamol]   . Tomato Hives  . Adhesive [Tape] Rash    Sensitive to EKG leads  . Tetracycline Hcl Rash    Can take Doxy  . Tetracyclines & Related Rash   Family History  Problem Relation Age of Onset  . Rheumatologic disease Mother   . Hypertension Mother   . Hypertension Father   . Rheumatologic disease Sister   . Arthritis Sister        --Rheumatoid arthritis  . Ovarian cancer Sister 94  . Ulcerative colitis Brother   . Glaucoma Brother    Review of Systems: No headache, visual changes, nausea, vomiting, diarrhea, constipation, dizziness, abdominal pain, skin rash, fevers, chills, night sweats, weight loss, swollen lymph nodes, body aches, joint swelling, chest pain, shortness of breath, mood changes.  Positive muscle aches   Blood pressure 126/78, pulse 94, height 4' 11.5" (1.511 m), weight 234 lb (106.1 kg), last menstrual period 01/16/2002, SpO2 97 %.   Systems examined below as of 06/28/16 General: NAD A&O x3 mood, affect normal  HEENT: Pupils equal, extraocular movements intact no nystagmus Respiratory: not short of breath at rest or with speaking Cardiovascular: No lower extremity edema, non tender Skin: Warm dry intact with no signs of infection or rash on extremities or on axial  skeleton. Abdomen: Soft nontender, no masses Neuro: Cranial nerves  intact, neurovascularly intact in all extremities with 2+ DTRs and 2+ pulses. Lymph: No lymphadenopathy appreciated today  Gait antalgic gait still noted but patient's baseline MSK: mild  tender with full range of motion and good stability and symmetric strength and tone of shoulders, elbows, wrist,  hips and ankles bilaterally. Arthritic changes of multiple joints  Back Exam:  Inspection:loss of lordosis Motion: Flexion 30 deg, Extension 15 deg, Side Bending to 15 deg bilaterally,  Rotation to 25 SLR laying: negative  XSLR laying: Negative  Continued diffuse tenderness from the piriformis musculature from the  scapulas to the sacrum FABER: Mild increasing tightness from previous exam Sensory change: Gross sensation intact to all lumbar and sacral dermatomes.  Reflexes: 2+ at both patellar tendons, 2+ at achilles tendons, Babinski's downgoing.  Strength at foot   Knee: Bilateral valgus deformity noted. Large thigh to calf ratio.  Tender to palpation over medial and PF joint line.  ROM full in flexion and extension and lower leg rotation. instability with valgus force.  painful patellar compression. Patellar glide with moderate crepitus. Patellar and quadriceps tendons unremarkable. Hamstring and quadriceps strength is normal.   Osteopathic findings C6 flexed rotated and side bent left T3 extended rotated and side bent right inhaled third rib T10 extended rotated and side bent left L4 flexed rotated and side bent right Sacrum right on right  After informed written and verbal consent, patient was seated on exam table. Right knee was prepped with alcohol swab and utilizing anterolateral approach, patient's right knee space was injected with 4:1  marcaine 0.5%: Kenalog 40mg /dL. Patient tolerated the procedure well without immediate complications.  After informed written and verbal consent, patient was seated on exam table. Left knee was prepped with alcohol swab and utilizing anterolateral approach, patient's left knee space was injected with 4:1  marcaine 0.5%: Kenalog 40mg /dL. Patient tolerated the procedure well without immediate complications.

## 2016-07-26 DIAGNOSIS — Z961 Presence of intraocular lens: Secondary | ICD-10-CM | POA: Diagnosis not present

## 2016-07-26 DIAGNOSIS — H35373 Puckering of macula, bilateral: Secondary | ICD-10-CM | POA: Diagnosis not present

## 2016-07-26 DIAGNOSIS — H5315 Visual distortions of shape and size: Secondary | ICD-10-CM | POA: Diagnosis not present

## 2016-07-26 DIAGNOSIS — H35363 Drusen (degenerative) of macula, bilateral: Secondary | ICD-10-CM | POA: Diagnosis not present

## 2016-07-28 ENCOUNTER — Encounter: Payer: Self-pay | Admitting: Obstetrics and Gynecology

## 2016-07-28 ENCOUNTER — Other Ambulatory Visit (HOSPITAL_COMMUNITY)
Admission: RE | Admit: 2016-07-28 | Discharge: 2016-07-28 | Disposition: A | Discharge status: Home / Self Care | Payer: Medicare Other | Source: Ambulatory Visit | Attending: Obstetrics and Gynecology | Admitting: Obstetrics and Gynecology

## 2016-07-28 ENCOUNTER — Ambulatory Visit (INDEPENDENT_AMBULATORY_CARE_PROVIDER_SITE_OTHER): Payer: Medicare Other | Admitting: Obstetrics and Gynecology

## 2016-07-28 VITALS — BP 142/78 | HR 68 | Resp 16 | Ht 60.0 in | Wt 231.0 lb

## 2016-07-28 DIAGNOSIS — Z01419 Encounter for gynecological examination (general) (routine) without abnormal findings: Secondary | ICD-10-CM

## 2016-07-28 DIAGNOSIS — Z124 Encounter for screening for malignant neoplasm of cervix: Secondary | ICD-10-CM | POA: Diagnosis not present

## 2016-07-28 DIAGNOSIS — R300 Dysuria: Secondary | ICD-10-CM

## 2016-07-28 DIAGNOSIS — Z8041 Family history of malignant neoplasm of ovary: Secondary | ICD-10-CM | POA: Diagnosis not present

## 2016-07-28 LAB — POCT URINALYSIS DIPSTICK
Bilirubin, UA: NEGATIVE
Glucose, UA: NEGATIVE
Ketones, UA: NEGATIVE
NITRITE UA: NEGATIVE
PH UA: 6 (ref 5.0–8.0)
PROTEIN UA: NEGATIVE
UROBILINOGEN UA: 0.2 U/dL

## 2016-07-28 NOTE — Patient Instructions (Signed)

## 2016-07-28 NOTE — Progress Notes (Signed)
66 y.o. G67P2002 Married Caucasian female here for annual exam.    Has been having yearly ultrasounds to check her ovaries due to Branch ovarian cancer.  Last ultrasound done 10/28/15 and showed stable fibroids including left cervical fibroid.  Normal ovaries.   Slight burning with urination essentially always per patient.  Having more urgency and leakage with sneeze.  Has gained weight back.  Can explain this due to decreased activity and diet change.  Did see Ileana Roup and did well.  Not able to continue this at this time.   Had cataract surgery and having visual changes.  Seeing opthalmology.   Having joint pain and reacts to contact with certain foods.  PCP: Dr. Christa See    Patient's last menstrual period was 01/16/2002.           Sexually active: No.  The current method of family planning is post menopausal status.    Exercising: Yes.    gardening, water aerobics Smoker:  no  Health Maintenance: Pap:  06/24/14 Neg  History of abnormal Pap:  Yes, years ago per patient MMG:  10/25/15 BIRADS1:neg Colonoscopy:  2011 in [place];next due 2021 BMD:   09/10/13  Result  normal TDaP:  03/30/14.  Hep C: 11/17/14 Negative Screening Labs:  PCP takes care of labs, Urine today: trace RBC, trace WBC -- burning with urination, urgency   reports that she has never smoked. She has never used smokeless tobacco. She reports that she does not drink alcohol or use drugs.  Past Medical History:  Diagnosis Date  . Abnormal uterine bleeding   . Allergic rhinitis, cause unspecified   . Arthritis of knee, right   . Bursitis of left shoulder   . 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 Chronic pain   . Dyspareunia   . Fibroid   . Foot fracture, left 01/2015   and torn tendons  . GERD (gastroesophageal reflux disease)   . HTN (hypertension)   . Kidney stones   . Sleep apnea   . Urinary incontinence     Past Surgical History:  Procedure Laterality Date  .  dilatation and curettage   2000  . CARPAL TUNNEL RELEASE  1998  . CATARACT EXTRACTION Bilateral   . dental implants  2010  . ESOPHAGEAL DILATION  2010  . HAMMER TOE SURGERY     rt  . PELVIC LAPAROSCOPY  1990  . ROTATOR CUFF REPAIR Right 09/2011   Dr. Tamera Punt  . ROTATOR CUFF REPAIR Left 11/11/13   Dr. Tamera Punt   . SEPTOPLASTY    . UMBILICAL HERNIA REPAIR  2010    Current Outpatient Prescriptions  Medication Sig Dispense Refill  . Cholecalciferol (VITAMIN D3) 5000 units CAPS Take 5,000 Units by mouth every other day.     . Chromium Picolinate 500 MCG CAPS Take 1 capsule by mouth 2 (two) times daily.    . Cyanocobalamin (B-12) 1000 MCG/ML KIT Inject 1 Dose as directed every 14 (fourteen) days. 6 kit 3  . Multiple Vitamin (MULTIVITAMIN) tablet Take 1 tablet by mouth daily.     . Needles & Syringes MISC 1 Can by Does not apply route once a week. Dispense meals for B12 IM injections please thank you 10 each 3  . Probiotic Product (PROBIOTIC DAILY PO) Take 1 capsule by mouth daily. 5 billion    . vitamin C (ASCORBIC ACID) 500 MG tablet Take 500 mg by mouth 2 (two) times daily.      No current facility-administered  medications for this visit.     Family History  Problem Relation Age of Onset  . Rheumatologic disease Mother   . Diabetes Mother   . Heart attack Father   . Rheumatologic disease Sister   . Arthritis Sister        --Rheumatoid arthritis  . Ovarian cancer Sister 31  . Crohn's disease Brother   . Kidney failure Brother   . Ulcerative colitis Brother   . Glaucoma Brother     ROS:  Pertinent items are noted in HPI.  Otherwise, a comprehensive ROS was negative.  Exam:   BP (!) 142/78 (BP Location: Right Arm, Patient Position: Sitting, Cuff Size: Large)   Pulse 68   Resp 16   Ht 5' (1.524 m)   Wt 231 lb (104.8 kg)   LMP 01/16/2002   BMI 45.11 kg/m     General appearance: alert, cooperative and appears stated age Head: Normocephalic, without obvious abnormality, atraumatic Neck: no  adenopathy, supple, symmetrical, trachea midline and thyroid normal to inspection and palpation Lungs: clear to auscultation bilaterally Breasts: normal appearance, no masses or tenderness, No nipple retraction or dimpling, No nipple discharge or bleeding, No axillary or supraclavicular adenopathy Heart: regular rate and rhythm Abdomen: obese, soft, non-tender; no masses, no organomegaly Extremities: extremities normal, atraumatic, no cyanosis or edema Skin: Skin color, texture, turgor normal. No rashes or lesions Lymph nodes: Cervical, supraclavicular, and axillary nodes normal. No abnormal inguinal nodes palpated Neurologic: Grossly normal  Pelvic: External genitalia:  no lesions              Urethra:  normal appearing urethra with no masses, tenderness or lesions              Bartholins and Skenes: normal                 Vagina: normal appearing vagina with normal color and discharge, no lesions              Cervix: no lesions              Pap taken: Yes.   Bimanual Exam:  Uterus:  normal size, contour, position, consistency, mobility, non-tender.  BM exam limited by [facility].               Adnexa: no mass, fullness, tenderness              Rectal exam: Yes.  .  Confirms.              Anus:  normal sphincter tone, no lesions  Chaperone was present for exam.  Assessment:   Well woman visit with normal exam. FH ovarian cancer.  Remote hx abnormal pap.  Dysuria.  Longstanding.  Mixed incontinence. Fibroids. Weight gain.   Plan: Mammogram screening discussed. Recommended self breast awareness. Pap and HR HPV as above. Guidelines for Calcium, Vitamin D, regular exercise program including cardiovascular and weight bearing exercise. Will refer to Dr. Dennard Nip for weight management.  Pelvic US before the end of year to check ovaries. Urine micro and cx. Declines referral to pelvic PT at this time. Follow up annually and prn.    After visit summary provided.

## 2016-07-29 LAB — URINALYSIS, MICROSCOPIC ONLY
BACTERIA UA: NONE SEEN
Casts: NONE SEEN /lpf
WBC, UA: NONE SEEN /hpf (ref 0–?)

## 2016-07-29 LAB — URINE CULTURE

## 2016-07-30 ENCOUNTER — Encounter: Payer: Self-pay | Admitting: Obstetrics and Gynecology

## 2016-07-31 ENCOUNTER — Encounter: Payer: Self-pay | Admitting: Pulmonary Disease

## 2016-07-31 ENCOUNTER — Ambulatory Visit (INDEPENDENT_AMBULATORY_CARE_PROVIDER_SITE_OTHER): Payer: Medicare Other | Admitting: Family Medicine

## 2016-07-31 ENCOUNTER — Encounter: Payer: Self-pay | Admitting: Family Medicine

## 2016-07-31 ENCOUNTER — Telehealth: Payer: Self-pay | Admitting: Pulmonary Disease

## 2016-07-31 ENCOUNTER — Ambulatory Visit (INDEPENDENT_AMBULATORY_CARE_PROVIDER_SITE_OTHER): Payer: Medicare Other | Admitting: Pulmonary Disease

## 2016-07-31 VITALS — BP 136/84 | HR 94

## 2016-07-31 VITALS — BP 134/70 | HR 89 | Ht 60.0 in | Wt 233.0 lb

## 2016-07-31 DIAGNOSIS — M545 Low back pain, unspecified: Secondary | ICD-10-CM

## 2016-07-31 DIAGNOSIS — G4733 Obstructive sleep apnea (adult) (pediatric): Secondary | ICD-10-CM | POA: Diagnosis not present

## 2016-07-31 DIAGNOSIS — M999 Biomechanical lesion, unspecified: Secondary | ICD-10-CM | POA: Diagnosis not present

## 2016-07-31 DIAGNOSIS — G8929 Other chronic pain: Secondary | ICD-10-CM

## 2016-07-31 LAB — CYTOLOGY - PAP: Diagnosis: NEGATIVE

## 2016-07-31 MED ORDER — MONTELUKAST SODIUM 10 MG PO TABS: 10.0000 mg | ORAL_TABLET | Freq: Every day | ORAL | 3 refills | Status: DC

## 2016-07-31 NOTE — Patient Instructions (Signed)
Ask your eye doctor if your lenses are blocking between 430 to 550 wavelength for blue light  Will arrange for in lab CPAP titration study

## 2016-07-31 NOTE — Progress Notes (Signed)
Current Outpatient Prescriptions on File Prior to Visit  Medication Sig  . Cholecalciferol (VITAMIN D3) 5000 units CAPS Take 5,000 Units by mouth every other day.   . Chromium Picolinate 500 MCG CAPS Take 1 capsule by mouth 2 (two) times daily.  . Cyanocobalamin (B-12) 1000 MCG/ML KIT Inject 1 Dose as directed every 14 (fourteen) days.  . Multiple Vitamin (MULTIVITAMIN) tablet Take 1 tablet by mouth daily.   . Needles & Syringes MISC 1 Can by Does not apply route once a week. Dispense meals for B12 IM injections please thank you  . Probiotic Product (PROBIOTIC DAILY PO) Take 1 capsule by mouth daily. 5 billion  . vitamin C (ASCORBIC ACID) 500 MG tablet Take 500 mg by mouth 2 (two) times daily.    No current facility-administered medications on file prior to visit.      Chief Complaint  Patient presents with  . Follow-up    Pt wears CPAP nightly. Pt has a few questions regarding her mask fit and replacement. Pt also wanting to know the benefits of using the CPAP vs sleeping in recliner -- at times she will be more rested without CPAP in a recliner.  DME: [facility]     Sleep tests HST 03/04/15 >> AHI 21.6, SpO2 low 80% Auto CPAP 07/01/16 to 07/30/16 >> used on 30 of 30 nights with average 5 hrs 54 min.  Average AHI 0.3 with median CPAP 6 and 95 th percentile CPAP 8 cm H2O  Past medical history Allergies, GERD, HTN, Nephrolithiasis, Chronic pain  Past surgical history, Family history, Social history, Allergies reviewed  Vital Signs BP 136/84 (BP Location: Left Arm, Cuff Size: Normal)   Pulse 94   LMP 01/16/2002   SpO2 95%   History of Present Illness Kerry [name] is a 66 y.o. female with obstructive sleep apnea.  She has an unpredictable sleep pattern.  She feels like her sleep is better when she sleeps in a recliner initially, and then goes to bed and uses CPAP.  If she tries to go to bed earlier and use CPAP all night, she feels her sleep is worse.  She was asking about how to  determine if she enters REM sleep.  She was also asking if her cataract lens replacements could be interfering with blue light absorption and causes circadian disruption.  She got a new mattress.  This is softer.  She has noticed her CPAP reports higher sleep apnea number since she got new mattress.  She was to get a full face mask, but this wasn't every fully set up by her DME.   Physical Exam  General - pleasant Eyes - pupils reactive ENT - no sinus tenderness, no oral exudate, no LAN, MP 3, wears dentures Cardiac - regular, no murmur Chest - no wheeze, rales Abd - soft, non tender Ext - no edema Skin - no rashes Neuro - normal strength Psych - normal mood   Assessment/Plan  Obstructive sleep apnea. - she is compliant with CPAP and reports benefit - will arrange for CPAP titration study in sleep lab to determine if she needs further adjustment to her set up  Obesity. - discussed importance of weight loss   Patient Instructions  Ask your eye doctor if your lenses are blocking between 430 to 550 wavelength for blue light  Will arrange for in lab CPAP titration study  Time spent 28 minutes  Chesley Mires, MD [facility] Pager:  [phone] 07/31/2016, 5:35 PM

## 2016-07-31 NOTE — Progress Notes (Signed)
Pre visit review using our clinic review tool, if applicable. No additional management support is needed unless otherwise documented below in the visit note. 

## 2016-07-31 NOTE — Telephone Encounter (Signed)
Reviewed with pt at [facility] on 07/31/16.

## 2016-07-31 NOTE — Assessment & Plan Note (Signed)
Decision today to treat with OMT was based on Physical Exam  After verbal consent patient was treated with  ME, FPR techniques in cervical, thoracic, lumbar and sacral areas  Patient tolerated the procedure well with improvement in symptoms  Patient given exercises, stretches and lifestyle modifications  See medications in patient instructions if given  Patient will follow up in 4-6 weeks 

## 2016-07-31 NOTE — Progress Notes (Signed)
CC: Follow  Up pain    Bilateral knee pain. Patient has had severe amount of pain in the knees. Does have bone-on-bone osteophytic changes. Patient recently finished Supartz injections in the left knee. Patient did not have a good response to this. Was given steroid injections in the knees bilaterally is feeling better at this time.   Continues to have low back pain. Has responded somewhat to manipulation. Continues to have a dull throbbing aching pain. Seems to be worse at her knee. Been worse. This then turned Effexor upper back as well.  Not taking any regularly.Patient is considering that some of this is allergic reaction. Injections in the knees helped all bodies.    Past Medical History:  Diagnosis Date  . Abnormal uterine bleeding   . Allergic rhinitis, cause unspecified   . Arthritis of knee, right   . Bursitis of left shoulder   . 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 Chronic pain   . Dyspareunia   . Fibroid   . Foot fracture, left 01/2015   and torn tendons  . GERD (gastroesophageal reflux disease)   . HTN (hypertension)   . Kidney stones   . Sleep apnea   . Urinary incontinence    Past Surgical History:  Procedure Laterality Date  .  dilatation and curettage  2000  . CARPAL TUNNEL RELEASE  1998  . CATARACT EXTRACTION Bilateral   . dental implants  2010  . ESOPHAGEAL DILATION  2010  . HAMMER TOE SURGERY     rt  . PELVIC LAPAROSCOPY  1990  . ROTATOR CUFF REPAIR Right 09/2011   Dr. Tamera Punt  . ROTATOR CUFF REPAIR Left 11/11/13   Dr. Tamera Punt   . SEPTOPLASTY    . UMBILICAL HERNIA REPAIR  2010   Social History  Substance Use Topics  . Smoking status: Never Smoker  . Smokeless tobacco: Never Used  . Alcohol use No   Allergies  Allergen Reactions  . Cefixime Swelling  . Celebrex [Celecoxib] Swelling  . Gabapentin Other (See Comments)    Joint pain  . Levofloxacin Other (See Comments)    Muscle and joint pain  . Other Other (See Comments)    Other  Reaction: painful joints TEQUIN. TEQUIN - JOINT AND MUSCLE PAIN  . Penicillins Hives  . Robaxin [Methocarbamol]   . Tomato Hives  . Adhesive [Tape] Rash    Sensitive to EKG leads  . Tetracycline Hcl Rash    Can take Doxy  . Tetracyclines & Related Rash   Family History  Problem Relation Age of Onset  . Rheumatologic disease Mother   . Diabetes Mother   . Heart attack Father   . Rheumatologic disease Sister   . Arthritis Sister        --Rheumatoid arthritis  . Ovarian cancer Sister 76  . Crohn's disease Brother   . Kidney failure Brother   . Ulcerative colitis Brother   . Glaucoma Brother    Review of Systems: No headache, visual changes, nausea, vomiting, diarrhea, constipation, dizziness, abdominal pain, skin rash, fevers, chills, night sweats, weight loss, swollen lymph nodes, body aches, joint swelling,  chest pain, shortness of breath, mood changes.  Positive muscle aches   Blood pressure 134/70, pulse 89, height 5' (1.524 m), weight 233 lb (105.7 kg), last menstrual period 01/16/2002, SpO2 100 %.   Systems examined below as of 07/31/16 General: NAD A&O x3 mood, affect normal  HEENT: Pupils equal, extraocular movements intact no nystagmus Respiratory: not short  of breath at rest or with speaking Cardiovascular: No lower extremity edema, non tender Skin: Warm dry intact with no signs of infection or rash on extremities or on axial skeleton. Abdomen: Soft nontender, no masses Neuro: Cranial nerves  intact, neurovascularly intact in all extremities with 2+ DTRs and 2+ pulses. Lymph: No lymphadenopathy appreciated today  Gait antalgic gait MSK: mild  tender with full range of motion and good stability and symmetric strength and tone of shoulders, elbows, wrist,  hips and ankles bilaterally. Arthritic changes of multiple joints  Back Exam:  Inspection:loss of lordosis Motion: Flexion 30 deg, Extension 15 deg, Side Bending to 15 deg bilaterally,  Rotation to 25 SLR  laying: negative  XSLR laying: Negative   no significant worsening in tenderness. Seems stable from previous exam FABER:  Continued tightness Sensory change: Gross sensation intact to all lumbar and sacral dermatomes.  Reflexes: 2+ at both patellar tendons, 2+ at achilles tendons, Babinski's downgoing.  Strength at foot   Knee:in bilateral valgus deformity noted. Large thigh to calf ratio.  Tender to palpation over medial and PF joint line.  ROM full in flexion and extension and lower leg rotation. instability with valgus force.  painful patellar compression. Patellar glide with moderate crepitus. Patellar and quadriceps tendons unremarkable. Hamstring and quadriceps strength is normal.   Osteopathic findings C2 flexed rotated and side bent right C4 flexed rotated and side bent left C7 flexed rotated and side bent left T3 extended rotated and side bent right inhaled third rib T7 extended rotated and side bent left L1 flexed rotated and side bent right Sacrum right on right

## 2016-07-31 NOTE — Telephone Encounter (Signed)
Received the following e-mails through [facility]: email 1 of 2    FYI. I will have the following questions at this afternoon's appt.    I frequently fall asleep in a semi-reclined position without the CPAP before moving to the bedroom and starting the CPAP. On the days I try to use the CPAP all night long, I seem to feel less rested than when I have spent part of the night without it sleeping on a recliner.    1. How do I know if I am actually achieving REM sleep when I use the CPAP?    2. Is it possible that sleeping in a semi-reclined position has a similar benefit as the pressurized air? If so, how would I find out how upright I need to be to get the best result?    3. I had cataract surgeries in March. After the surgeries, I received info that the lenses have blue light filters in them to help prevent macular degeneration. I have heard that I need a lot of blue light from outdoor light early in the morning to set my circadian rhythm and to sleep better at night. (continued in email 2)   email 2 of 2    (continued from email 1)  I have heard that I need a lot of blue light from outdoor light early in the morning to set my circadian rhythm and to sleep better at night. Do you have any info on whether the blue light filters in my lenses will have an effect on how well I can sleep at night?    4. I have been using an 800 number for [facility] for my CPAP supplies. Last November I ordered a full face mask that I never completely received after multiple attempts to correct the order. In March I gave up and continued using my nasal mask.    There is an [employer] Patient office in [place], but they always refer me to the 800 number for supplies. They have few masks to review. Do you know if there is still a medical supply place in [place] that stocks a wide assortment of masks where I could decide if a different mask would work better for me?    Thanks.    VS -  please advise. Thanks.

## 2016-07-31 NOTE — Patient Instructions (Signed)
Good to see you Try the Singulair nightly for at least 1 month.  I think the anti-parasite would be great 2 times a dya for 5 days and see how you feel  I hope the injections in the knee helps See me again 4-6 weeks

## 2016-07-31 NOTE — Assessment & Plan Note (Signed)
Low back pain is likely secondary lesion multifactorial the patient's poor core strength as well as patients long as any of the other inflammatory properties with patient having difficulty with multiple medications. Continues to respond to osteopathic manipulation.Follow-up againin 4-6 weeks

## 2016-08-02 ENCOUNTER — Encounter: Payer: Self-pay | Admitting: *Deleted

## 2016-08-02 ENCOUNTER — Telehealth: Payer: Self-pay | Admitting: Obstetrics and Gynecology

## 2016-08-02 NOTE — Telephone Encounter (Signed)
Call placed to patient to review benefits for a recommended ultrasound. Left voicemail requesting a return call.

## 2016-08-03 NOTE — Telephone Encounter (Signed)
Spoke with patient in regards to recommended ultrasound. Providers instructions state to schedule before the end of 2018, per patient she will look at scheduling in November. Advised patient we will pre-certify the recommended ultrasound closer to that time and will follow up in mid-October for scheduling. Patient is agreeable.  Routing to Dr Quincy Simmonds

## 2016-08-03 NOTE — Telephone Encounter (Signed)
Thank you for the update.  Encounter closed. 

## 2016-08-04 ENCOUNTER — Encounter: Payer: Self-pay | Admitting: Family Medicine

## 2016-08-11 NOTE — Telephone Encounter (Signed)
Pt said that she has not had great improvement and wanted to try to get in to see Dr Tamala Julian. Unfortunately, the first opening is not until Aug 7th. She wanted to know how long she should be taking the Tylenol. She is not able to lift the bottom part of her leg so she is not able to drive.

## 2016-08-15 ENCOUNTER — Other Ambulatory Visit: Payer: Self-pay | Admitting: *Deleted

## 2016-08-15 ENCOUNTER — Other Ambulatory Visit (INDEPENDENT_AMBULATORY_CARE_PROVIDER_SITE_OTHER): Payer: Medicare Other

## 2016-08-15 DIAGNOSIS — M2550 Pain in unspecified joint: Secondary | ICD-10-CM

## 2016-08-15 LAB — C-REACTIVE PROTEIN: CRP: 0.7 mg/dL (ref 0.5–20.0)

## 2016-08-15 LAB — SEDIMENTATION RATE: SED RATE: 22 mm/h (ref 0–30)

## 2016-08-15 NOTE — Telephone Encounter (Signed)
Pt called in and would like you to give her a call when you get a chance?

## 2016-08-20 LAB — STRONGYLOIDES ANTIBODY: Strongyloides IgG Antibody, ELISA: NEGATIVE

## 2016-08-24 ENCOUNTER — Encounter: Payer: Self-pay | Admitting: Family Medicine

## 2016-09-01 ENCOUNTER — Other Ambulatory Visit (INDEPENDENT_AMBULATORY_CARE_PROVIDER_SITE_OTHER): Payer: Medicare Other

## 2016-09-01 ENCOUNTER — Ambulatory Visit (INDEPENDENT_AMBULATORY_CARE_PROVIDER_SITE_OTHER): Payer: Medicare Other | Admitting: Family Medicine

## 2016-09-01 VITALS — BP 158/88 | HR 82 | Wt 233.0 lb

## 2016-09-01 DIAGNOSIS — M999 Biomechanical lesion, unspecified: Secondary | ICD-10-CM | POA: Diagnosis not present

## 2016-09-01 DIAGNOSIS — M2550 Pain in unspecified joint: Secondary | ICD-10-CM

## 2016-09-01 DIAGNOSIS — G894 Chronic pain syndrome: Secondary | ICD-10-CM

## 2016-09-01 DIAGNOSIS — E559 Vitamin D deficiency, unspecified: Secondary | ICD-10-CM | POA: Diagnosis not present

## 2016-09-01 DIAGNOSIS — E538 Deficiency of other specified B group vitamins: Secondary | ICD-10-CM

## 2016-09-01 DIAGNOSIS — M1711 Unilateral primary osteoarthritis, right knee: Secondary | ICD-10-CM | POA: Diagnosis not present

## 2016-09-01 LAB — CBC WITH DIFFERENTIAL/PLATELET
BASOS PCT: 0.3 % (ref 0.0–3.0)
Basophils Absolute: 0 10*3/uL (ref 0.0–0.1)
EOS ABS: 0.2 10*3/uL (ref 0.0–0.7)
EOS PCT: 2.7 % (ref 0.0–5.0)
HCT: 44.6 % (ref 36.0–46.0)
HEMOGLOBIN: 15 g/dL (ref 12.0–15.0)
LYMPHS ABS: 1.6 10*3/uL (ref 0.7–4.0)
Lymphocytes Relative: 21.9 % (ref 12.0–46.0)
MCHC: 33.6 g/dL (ref 30.0–36.0)
MCV: 91.3 fl (ref 78.0–100.0)
MONO ABS: 0.6 10*3/uL (ref 0.1–1.0)
Monocytes Relative: 8.1 % (ref 3.0–12.0)
NEUTROS ABS: 4.8 10*3/uL (ref 1.4–7.7)
NEUTROS PCT: 67 % (ref 43.0–77.0)
Platelets: 248 10*3/uL (ref 150.0–400.0)
RBC: 4.88 Mil/uL (ref 3.87–5.11)
RDW: 14.2 % (ref 11.5–15.5)
WBC: 7.1 10*3/uL (ref 4.0–10.5)

## 2016-09-01 LAB — B12 AND FOLATE PANEL
FOLATE: 23.3 ng/mL (ref >5.9)
VITAMIN B 12: 690 pg/mL (ref 211–911)

## 2016-09-01 LAB — VITAMIN D 25 HYDROXY (VIT D DEFICIENCY, FRACTURES): VITD: 43.86 ng/mL (ref 30.00–100.00)

## 2016-09-01 MED ORDER — TRAMADOL HCL 50 MG PO TABS: 50.0000 mg | ORAL_TABLET | Freq: Two times a day (BID) | ORAL | 1 refills | Status: DC | PRN

## 2016-09-01 NOTE — Progress Notes (Signed)
CC: Follow  Up pain    Bilateral knee pain. Patient has had severe amount of pain in the knees. Does have bone-on-bone osteophytic changes. Continues to have pain. Does not want another injection at this time because patient will once the medication or before her trip. Patient continues to have instability. Continues to have swelling. HAS MULTIPLE ACHES ALL OVER HER BODY. HAS HAD DIFFICULTY WITH THIS FOR QUITE SOME TIME. SEEMS TO BE MORE OF A SERONEGATIVE RHEUMATOID ARTHRITIS. PATIENT HAS NOT WANTING TO CHART DIFFERENT MEDICATIONS.  Patient also wanting to know if his any chance that this would be a possible stress reaction. Looking for different laboratory workup. Has had low vitamin D and has not had this checked and some time. Patient also concerned that some of this can be secondary to dental implants or patient's hernia mesh and she may be having more of an allergy.  Past Medical History:  Diagnosis Date  . Abnormal uterine bleeding   . Allergic rhinitis, cause unspecified   . Arthritis of knee, right   . Bursitis of left shoulder   . 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 Chronic pain   . Dyspareunia   . Fibroid   . Foot fracture, left 01/2015   and torn tendons  . GERD (gastroesophageal reflux disease)   . HTN (hypertension)   . Kidney stones   . Sleep apnea   . Urinary incontinence    Past Surgical History:  Procedure Laterality Date  .  dilatation and curettage  2000  . CARPAL TUNNEL RELEASE  1998  . CATARACT EXTRACTION Bilateral   . dental implants  2010  . ESOPHAGEAL DILATION  2010  . HAMMER TOE SURGERY     rt  . PELVIC LAPAROSCOPY  1990  . ROTATOR CUFF REPAIR Right 09/2011   Dr. Tamera Punt  . ROTATOR CUFF REPAIR Left 11/11/13   Dr. Tamera Punt   . SEPTOPLASTY    . UMBILICAL HERNIA REPAIR  2010   Social History  Substance Use Topics  . Smoking status: Never Smoker  . Smokeless tobacco: Never Used  . Alcohol use No   Allergies  Allergen Reactions  . Cefixime  Swelling  . Celebrex [Celecoxib] Swelling  . Gabapentin Other (See Comments)    Joint pain  . Levofloxacin Other (See Comments)    Muscle and joint pain  . Other Other (See Comments)    Other Reaction: painful joints TEQUIN. TEQUIN - JOINT AND MUSCLE PAIN  . Penicillins Hives  . Robaxin [Methocarbamol]   . Tomato Hives  . Adhesive [Tape] Rash    Sensitive to EKG leads  . Tetracycline Hcl Rash    Can take Doxy  . Tetracyclines & Related Rash   Family History  Problem Relation Age of Onset  . Rheumatologic disease Mother   . Diabetes Mother   . Heart attack Father   . Rheumatologic disease Sister   . Arthritis Sister        --Rheumatoid arthritis  . Ovarian cancer Sister 53  . Crohn's disease Brother   . Kidney failure Brother   . Ulcerative colitis Brother   . Glaucoma Brother    Review of Systems: No headache, visual changes, nausea, vomiting, diarrhea, constipation, dizziness, abdominal pain, skin rash, fevers, chills, night sweats, weight loss, swollen lymph nodes,  chest pain, shortness of breath, mood changes.  Positive muscle aches, body aches   Blood pressure (!) 158/88, pulse 82, weight 233 lb (105.7 kg), last menstrual period 01/16/2002.  General: NAD A&O x3 mood, affect normal  HEENT: Pupils equal, extraocular movements intact no nystagmus Respiratory: not short of breath at rest or with speaking Cardiovascular: No lower extremity edema, non tender Skin: Warm dry intact with no signs of infection or rash on extremities or on axial skeleton. Abdomen: Soft nontender, no masses Neuro: Cranial nerves  intact, neurovascularly intact in all extremities with 2+ DTRs and 2+ pulses. Lymph: No lymphadenopathy appreciated today  Gait antalgic gait MSK: Mild to moderate tender with full range of motion and good stability and symmetric strength and tone of shoulders, elbows, wrist,  hips and ankles bilaterally. Arthritic changes of multiple joints  Back Exam:   Inspection:loss of lordosis Motion: Flexion 35 deg, Extension 15 deg, Side Bending to 15 deg bilaterally,  Rotation to 25 SLR laying: negative  XSLR laying: Negative   no significant worsening in tenderness. Seems stable from previous exam FABER:  Continued tightness Sensory change: Gross sensation intact to all lumbar and sacral dermatomes.  Reflexes: 2+ at both patellar tendons, 2+ at achilles tendons, Babinski's downgoing.  Strength at foot   Knee: Bilateral valgus deformity noted. Large thigh to calf ratio.  Tender to palpation over medial and PF joint line.  ROM full in flexion and extension and lower leg rotation. instability with valgus force.  painful patellar compression. Patellar glide with moderate crepitus. Patellar and quadriceps tendons unremarkable. Hamstring and quadriceps strength is normal.    Osteopathic findings C2 flexed rotated and side bent right  C6 flexed rotated and side bent left T3 extended rotated and side bent right inhaled third rib T9 extended rotated and side bent left L2 flexed rotated and side bent right Sacrum right on right

## 2016-09-01 NOTE — Patient Instructions (Signed)
Good to see you  Do the singulair. Take at night Consider the parasitic meds when mike returns.  Rollator may be good to give a break from the cain.  Tramadol up to 2 times a day with tylenol if in severe pain  See me again in 4 weeks (30 minutes please)

## 2016-09-02 ENCOUNTER — Encounter: Payer: Self-pay | Admitting: Family Medicine

## 2016-09-02 NOTE — Assessment & Plan Note (Signed)
Has had significant pain. Spent  45 minutes with patient face-to-face and had greater than 50% of counseling including as described in assessment and plan. Patient did bring a note poke of questions. We did address a significant number of them. We discussed the possibility of this being associated with any type of metal toxicity. Discussed with her the only thing that I would consider would be a singular prescription which patient did not take because she is concern of side effects. We also once again discussed the possibility of doing an internal treatment for seronegative rheumatoid arthritis but patient has declined this with her husband going out of town. We discussed the possibility of knee injections with patient declined. Patient was given some tramadol for breakthrough pain. We'll see how patient response. Follow-up again with me 4 weeks

## 2016-09-02 NOTE — Assessment & Plan Note (Signed)
Continue to monitor. Patient will likely have steroid injections within the next month before she leaves out of town.

## 2016-09-02 NOTE — Assessment & Plan Note (Signed)
Decision today to treat with OMT was based on Physical Exam  After verbal consent patient was treated with  ME, FPR techniques in cervical, thoracic, lumbar and sacral areas  Patient tolerated the procedure well with improvement in symptoms  Patient given exercises, stretches and lifestyle modifications  See medications in patient instructions if given  Patient will follow up in 4 weeks 

## 2016-09-04 LAB — ANTISTREPTOLYSIN O TITER: ASO: 55 IU/mL (ref <200)

## 2016-09-05 ENCOUNTER — Ambulatory Visit: Payer: Medicare Other | Admitting: Family Medicine

## 2016-09-07 DIAGNOSIS — R6884 Jaw pain: Secondary | ICD-10-CM | POA: Insufficient documentation

## 2016-09-12 ENCOUNTER — Encounter (HOSPITAL_BASED_OUTPATIENT_CLINIC_OR_DEPARTMENT_OTHER): Payer: Medicare Other

## 2016-09-13 DIAGNOSIS — J341 Cyst and mucocele of nose and nasal sinus: Secondary | ICD-10-CM | POA: Diagnosis not present

## 2016-09-13 DIAGNOSIS — J329 Chronic sinusitis, unspecified: Secondary | ICD-10-CM | POA: Diagnosis not present

## 2016-09-20 ENCOUNTER — Encounter: Payer: Self-pay | Admitting: Family Medicine

## 2016-09-20 ENCOUNTER — Ambulatory Visit (INDEPENDENT_AMBULATORY_CARE_PROVIDER_SITE_OTHER): Payer: Medicare Other | Admitting: Family Medicine

## 2016-09-20 VITALS — BP 144/84 | HR 107 | Ht 60.0 in | Wt 236.0 lb

## 2016-09-20 DIAGNOSIS — M999 Biomechanical lesion, unspecified: Secondary | ICD-10-CM | POA: Diagnosis not present

## 2016-09-20 DIAGNOSIS — M545 Low back pain, unspecified: Secondary | ICD-10-CM

## 2016-09-20 DIAGNOSIS — G8929 Other chronic pain: Secondary | ICD-10-CM

## 2016-09-20 NOTE — Patient Instructions (Signed)
Good to see you  The back looks good overall but we will watch it.  I am hoping the manipulation would help  Lets stop the Singulair Lets ask about dentures and see if they are possible  I will get back to you on the cascade pathway and see what I can find Lets make an appointment in 3-4 weeks or so (30 minute appointment)

## 2016-09-20 NOTE — Assessment & Plan Note (Signed)
Patient's low back pain we know has degenerative disc disease. Likely some more of a lumbar radiculopathy. We discussed different medications. Has not taken any of the tramadol. We discussed we can do this on a more regular basis. Continues to have difficulty with multiple different medications in patient does not want to do any type of prednisone. Does not want to do chronic narcotics as well. Does not want to do chronic anti-inflammatories. Patient will continue to do other things. Attempted osteopathic manipulation and we'll plan to be some benefit. Follow-up again in 3-4 weeks

## 2016-09-20 NOTE — Assessment & Plan Note (Signed)
Decision today to treat with OMT was based on Physical Exam  After verbal consent patient was treated with HVLA, ME, FPR techniques in cervical, thoracic, lumbar and sacral areas  Patient tolerated the procedure well with improvement in symptoms  Patient given exercises, stretches and lifestyle modifications  See medications in patient instructions if given  Patient will follow up in 3-4 weeks  

## 2016-09-20 NOTE — Progress Notes (Signed)
[name] [address] [place], [place] [address] Phone: [phone] Subjective:    I'm seeing this patient by the request  of:    CC: Low back pain, polyarthralgia follow-up  [id number]  Kerry [name] is a 66 y.o. female coming in with complaint of low back pain and polyarthralgia. Placing patient's previous notes. Has had difficulty with multiple different things over the course of time and does have severe arthritis of the shoulders, knees, as well as back. Patient is having an exacerbation of the lumbar spine. Mild radicular symptoms going down the leg. Has had difficulty with multiple different medications. Patient states that we attempted the Singulair with no significant improvement. States that it's anything had 2 days of improvement and then worsening symptoms again. Patient states that she recently did get some metal out of her dental implants that seems to have helped her sinus pressure. Patient is following up with the dentist today and will be following up with her ENT neck week. Patient was unable to get the walker and is continuing to use a cane which continues to put her more stress on her shoulder.      Past Medical History:  Diagnosis Date  . Abnormal uterine bleeding   . Allergic rhinitis, cause unspecified   . Arthritis of knee, right   . Bursitis of left shoulder   . 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 Chronic pain   . Dyspareunia   . Fibroid   . Foot fracture, left 01/2015   and torn tendons  . GERD (gastroesophageal reflux disease)   . HTN (hypertension)   . Kidney stones   . Sleep apnea   . Urinary incontinence    Past Surgical History:  Procedure Laterality Date  .  dilatation and curettage  2000  . CARPAL TUNNEL RELEASE  1998  . CATARACT EXTRACTION Bilateral   . dental implants  2010  . ESOPHAGEAL DILATION  2010  . HAMMER TOE SURGERY     rt  . PELVIC LAPAROSCOPY  1990  . ROTATOR CUFF REPAIR Right 09/2011   Dr. Tamera Punt  . ROTATOR CUFF REPAIR Left 11/11/13   Dr. Tamera Punt   . SEPTOPLASTY    . UMBILICAL HERNIA REPAIR  2010   Social History   Social History  . Marital status: Married    Spouse name: N/A  . Number of children: N/A  . Years of education: N/A   Occupational History  . Retired    Social History Main Topics  . Smoking status: Never Smoker  . Smokeless tobacco: Never Used  . Alcohol use No  . Drug use: No  . Sexual activity: No   Other Topics Concern  . None   Social History Narrative  . None   Allergies  Allergen Reactions  . Cefixime Swelling  . Celebrex [Celecoxib] Swelling  . Gabapentin Other (See Comments)    Joint pain  . Levofloxacin Other (See Comments)    Muscle and joint pain  . Other Other (See Comments)    Other Reaction: painful joints TEQUIN. TEQUIN - JOINT AND MUSCLE PAIN  . Penicillins Hives  . Robaxin [Methocarbamol]   . Tomato Hives  . Adhesive [Tape] Rash    Sensitive to EKG leads  . Tetracycline Hcl Rash    Can take Doxy  . Tetracyclines & Related Rash   Family History  Problem Relation Age of Onset  . Rheumatologic disease Mother   . Diabetes Mother   .  Heart attack Father   . Rheumatologic disease Sister   . Arthritis Sister        --Rheumatoid arthritis  . Ovarian cancer Sister 65  . Crohn's disease Brother   . Kidney failure Brother   . Ulcerative colitis Brother   . Glaucoma Brother      Past medical history, social, surgical and family history all reviewed in electronic medical record.  No pertanent information unless stated regarding to the chief complaint.   Review of Systems:Review of systems updated and as accurate as of 09/20/16  No  visual changes, nausea, vomiting, diarrhea, constipation, dizziness, abdominal pain, skin rash, fevers, chills, night sweats, weight loss, swollen lymph nodes, s, chest pain, shortness of breath, mood changes. Positive body aches, joint swelling, muscle aches, headaches  Objective    Blood pressure (!) 144/84, pulse (!) 107, height 5' (1.524 m), weight 236 lb (107 kg), last menstrual period 01/16/2002, SpO2 97 %. Systems examined below as of 09/20/16   General: No apparent distress alert and oriented x3 mood and affect normal, dressed appropriately.  HEENT: Pupils equal, extraocular movements intact  Respiratory: Patient's speak in full sentences and does not appear short of breath  Cardiovascular: No lower extremity edema, non tender, no erythema  Skin: Warm dry intact with no signs of infection or rash on extremities or on axial skeleton.  Abdomen: Soft nontender  Neuro: Cranial nerves II through XII are intact, neurovascularly intact in all extremities with 2+ DTRs and 2+ pulses.  Lymph: No lymphadenopathy of posterior or anterior cervical chain or axillae bilaterally.  Gait Antalgic gait MSK:  tender with full range of motion and good stability and symmetric strength and tone of shoulders, elbows, wrist, hip, knee and ankles bilaterally. Severe arthritic changes of multiple joints Back exam shows the patient does have a mild positive straight leg test on the left sign. Tender to palpation over the L4-L5 area on the low left side. Pain in the piriformis muscle as well. Generalized discomfort with palpation of the left leg. Mild decrease in internal range of motion of the hip.  Osteopathic findings  C2 flexed rotated and side bent right C4 flexed rotated and side bent left C6 flexed rotated and side bent left T3 extended rotated and side bent right inhaled third rib T9 extended rotated and side bent left L2 flexed rotated and side bent right Sacrum right on right    Impression and Recommendations:     This case required medical decision making of moderate complexity.      Note: This dictation was prepared with Dragon dictation along with smaller phrase technology. Any transcriptional errors that result from this process are unintentional.

## 2016-10-03 DIAGNOSIS — R6884 Jaw pain: Secondary | ICD-10-CM | POA: Diagnosis not present

## 2016-10-05 ENCOUNTER — Ambulatory Visit: Payer: Medicare Other | Admitting: Family Medicine

## 2016-10-05 DIAGNOSIS — Z961 Presence of intraocular lens: Secondary | ICD-10-CM | POA: Diagnosis not present

## 2016-10-05 DIAGNOSIS — H26493 Other secondary cataract, bilateral: Secondary | ICD-10-CM | POA: Diagnosis not present

## 2016-10-05 DIAGNOSIS — H04123 Dry eye syndrome of bilateral lacrimal glands: Secondary | ICD-10-CM | POA: Diagnosis not present

## 2016-10-05 DIAGNOSIS — H35373 Puckering of macula, bilateral: Secondary | ICD-10-CM | POA: Diagnosis not present

## 2016-10-11 ENCOUNTER — Encounter (HOSPITAL_BASED_OUTPATIENT_CLINIC_OR_DEPARTMENT_OTHER): Payer: Medicare Other

## 2016-10-12 ENCOUNTER — Ambulatory Visit: Payer: Medicare Other | Admitting: Family Medicine

## 2016-10-12 ENCOUNTER — Encounter: Payer: Self-pay | Admitting: Family Medicine

## 2016-10-12 ENCOUNTER — Ambulatory Visit (INDEPENDENT_AMBULATORY_CARE_PROVIDER_SITE_OTHER): Payer: Medicare Other | Admitting: Family Medicine

## 2016-10-12 VITALS — BP 118/88 | HR 98 | Wt 237.4 lb

## 2016-10-12 DIAGNOSIS — G894 Chronic pain syndrome: Secondary | ICD-10-CM

## 2016-10-12 DIAGNOSIS — M999 Biomechanical lesion, unspecified: Secondary | ICD-10-CM

## 2016-10-12 NOTE — Assessment & Plan Note (Signed)
Continues to have chronic pain overall seems to be improving after last dental procedure. Patient does respond well to osteopathic manipulation. We discussed icing regimen and home exercises. Which exercises which icing regimen. Patient does well patient will follow-up with me again in 4-8 weeks.

## 2016-10-12 NOTE — Progress Notes (Signed)
[name] [address] [place], [place] [address] Phone: [phone] Subjective:    I'm seeing this patient by the request  of:    CC: Low back pain, polyarthralgia follow-up  ION:[id number]  Kerry [name] is a 66 y.o. female coming in with complaint of low back pain and polyarthralgia. Patient has had significant difficulty. Bent the moment we are treating patient for a diagnosis of more of a seronegative rheumatoid arthritis. Patient has elected to tries much conservative therapy is possible but does have tramadol for breakthrough pain. Patient recently has had an dental surgery with removal of some hardware. Over the course last 2 weeks she has noticed some of her aches and pains has been significantly improved and patient is walking more appropriately. Patient is fairly happy with this. Still has aches and pains but feels like she is increasing her endurance as well.      Past Medical History:  Diagnosis Date  . Abnormal uterine bleeding   . Allergic rhinitis, cause unspecified   . Arthritis of knee, right   . Bursitis of left shoulder   . 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 Chronic pain   . Dyspareunia   . Fibroid   . Foot fracture, left 01/2015   and torn tendons  . GERD (gastroesophageal reflux disease)   . HTN (hypertension)   . Kidney stones   . Sleep apnea   . Urinary incontinence    Past Surgical History:  Procedure Laterality Date  .  dilatation and curettage  2000  . CARPAL TUNNEL RELEASE  1998  . CATARACT EXTRACTION Bilateral   . dental implants  2010  . ESOPHAGEAL DILATION  2010  . HAMMER TOE SURGERY     rt  . PELVIC LAPAROSCOPY  1990  . ROTATOR CUFF REPAIR Right 09/2011   Dr. Tamera Punt  . ROTATOR CUFF REPAIR Left 11/11/13   Dr. Tamera Punt   . SEPTOPLASTY    . UMBILICAL HERNIA REPAIR  2010   Social History   Social History  . Marital status: Married    Spouse name: N/A  . Number of children: N/A  . Years of  education: N/A   Occupational History  . Retired    Social History Main Topics  . Smoking status: Never Smoker  . Smokeless tobacco: Never Used  . Alcohol use No  . Drug use: No  . Sexual activity: No   Other Topics Concern  . None   Social History Narrative  . None   Allergies  Allergen Reactions  . Cefixime Swelling  . Celebrex [Celecoxib] Swelling  . Gabapentin Other (See Comments)    Joint pain  . Levofloxacin Other (See Comments)    Muscle and joint pain  . Other Other (See Comments)    Other Reaction: painful joints TEQUIN. TEQUIN - JOINT AND MUSCLE PAIN  . Penicillins Hives  . Robaxin [Methocarbamol]   . Tomato Hives  . Adhesive [Tape] Rash    Sensitive to EKG leads  . Tetracycline Hcl Rash    Can take Doxy  . Tetracyclines & Related Rash   Family History  Problem Relation Age of Onset  . Rheumatologic disease Mother   . Diabetes Mother   . Heart attack Father   . Rheumatologic disease Sister   . Arthritis Sister        --Rheumatoid arthritis  . Ovarian cancer Sister 19  . Crohn's disease Brother   . Kidney failure Brother   .  Ulcerative colitis Brother   . Glaucoma Brother      Past medical history, social, surgical and family history all reviewed in electronic medical record.  No pertanent information unless stated regarding to the chief complaint.   Review of Systems: No headache, visual changes, nausea, vomiting, diarrhea, constipation, dizziness, abdominal pain, skin rash, fevers, chills, night sweats, weight loss, swollen lymph nodes, body aches, joint swelling, chest pain, shortness of breath, mood changes.  Positive muscle aches but improved  Objective  Blood pressure 118/88, pulse 98, weight 237 lb 6.4 oz (107.7 kg), last menstrual period 01/16/2002, SpO2 98 %.   Systems examined below as of 10/12/16 General: NAD A&O x3 mood, affect normal  HEENT: Pupils equal, extraocular movements intact no nystagmus Respiratory: not short of breath  at rest or with speaking Cardiovascular: No lower extremity edema, non tender Skin: Warm dry intact with no signs of infection or rash on extremities or on axial skeleton. Abdomen: Soft nontender, no masses Neuro: Cranial nerves  intact, neurovascularly intact in all extremities with 2+ DTRs and 2+ pulses. Lymph: No lymphadenopathy appreciated today  Gait Antalgic gait MSK:  tender with full range of motion and good stability and symmetric strength and tone of shoulders, elbows, wrist, hip, knee and ankles bilaterally. Severe arthritic changes of multiple joints Back exam shows the patient does have a mild positive straight leg test on the left sign. Tender to palpation over the L4-L5 area on the low left side. Pain in the piriformis muscle as well. Generalized discomfort with palpation of the left leg. Mild decrease in internal range of motion of the hip.  Osteopathic findings C2 flexed rotated and side bent right C4 flexed rotated and side bent left C6 flexed rotated and side bent left T3 extended rotated and side bent right inhaled third rib T9 extended rotated and side bent left L2 flexed rotated and side bent right Sacrum right on right    Impression and Recommendations:     This case required medical decision making of moderate complexity.      Note: This dictation was prepared with Dragon dictation along with smaller phrase technology. Any transcriptional errors that result from this process are unintentional.

## 2016-10-12 NOTE — Patient Instructions (Signed)
Good to see you  I am glad you are doing better Read about sulfasalazine.  Stay active Have a great trip  See me again in 4 weeks.

## 2016-10-12 NOTE — Assessment & Plan Note (Signed)
Decision today to treat with OMT was based on Physical Exam  After verbal consent patient was treated with HVLA, ME, FPR techniques in cervical, thoracic, rib lumbar and sacral areas  Patient tolerated the procedure well with improvement in symptoms  Patient given exercises, stretches and lifestyle modifications  See medications in patient instructions if given  Patient will follow up in 4 weeks 

## 2016-10-31 ENCOUNTER — Encounter: Payer: Self-pay | Admitting: Pulmonary Disease

## 2016-10-31 NOTE — Telephone Encounter (Signed)
Good Afternoon Dr. Halford Chessman,  This is FYI--no need to respond. I have twice postponed the sleep study you ordered last summer because of problems with dental implants that are also disrupting my sleep. I plan to get the dental resolved before attempting the sleep study.

## 2016-11-09 ENCOUNTER — Other Ambulatory Visit: Payer: Medicare Other

## 2016-11-09 ENCOUNTER — Encounter: Payer: Self-pay | Admitting: Family Medicine

## 2016-11-09 ENCOUNTER — Ambulatory Visit (INDEPENDENT_AMBULATORY_CARE_PROVIDER_SITE_OTHER): Payer: Medicare Other | Admitting: Family Medicine

## 2016-11-09 VITALS — BP 142/82 | HR 115 | Wt 237.0 lb

## 2016-11-09 DIAGNOSIS — M170 Bilateral primary osteoarthritis of knee: Secondary | ICD-10-CM

## 2016-11-09 DIAGNOSIS — M999 Biomechanical lesion, unspecified: Secondary | ICD-10-CM | POA: Diagnosis not present

## 2016-11-09 DIAGNOSIS — Z9109 Other allergy status, other than to drugs and biological substances: Secondary | ICD-10-CM

## 2016-11-09 DIAGNOSIS — G894 Chronic pain syndrome: Secondary | ICD-10-CM

## 2016-11-09 NOTE — Assessment & Plan Note (Signed)
Patient is a chronic pain for quite some time. Differential continues to be a seronegative autoimmune reaction. Patient does think because she has had a removal of the dental prosthesis in improvement in some of her symptoms we should consider the possibility of metal reaction. We will do a laboratory test blind but we'll send to allergy for further evaluation and testing. See if this will be beneficial. Patient and will follow-up with me again in 4 weeks for her regular routine injected.

## 2016-11-09 NOTE — Progress Notes (Signed)
[name] [address] [place], [place] [address] Phone: [phone] Subjective:    I'm seeing this patient by the request  of:    CC: Multiple complaints  [id number]  Kerry [name] is a 66 y.o. female coming in back pain.  Worsening symptoms of her back pain previous visit. Has degenerative disc disease at multiple levels. Has responded fairly well to osteopathic manipulation.  Patient also has more of a polyarthralgia. Seems to be more of a fibromyalgia versus chronic pain syndrome. Also possibly a seronegative rheumatoid arthritis. Patient though recently did have a dental procedure having the removal of multiple metal implants from the upper jaw. Since we hadn't remove the metal is having significant improvement.  Patient is also having worsening left knee pain. Has end-stage osteophytic changes of the knees bilaterally. Patient did have viscous supplementation and had difficulty. Does not 1 any surgical intervention until she figures out what is wrong with potential metal allergy. Patient is though interested at the possibility of formal physical therapy.    Past Medical History:  Diagnosis Date  . Abnormal uterine bleeding   . Allergic rhinitis, cause unspecified   . Arthritis of knee, right   . Bursitis of left shoulder   . 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 Chronic pain   . Dyspareunia   . Fibroid   . Foot fracture, left 01/2015   and torn tendons  . GERD (gastroesophageal reflux disease)   . HTN (hypertension)   . Kidney stones   . Sleep apnea   . Urinary incontinence    Past Surgical History:  Procedure Laterality Date  .  dilatation and curettage  2000  . CARPAL TUNNEL RELEASE  1998  . CATARACT EXTRACTION Bilateral   . dental implants  2010  . ESOPHAGEAL DILATION  2010  . HAMMER TOE SURGERY     rt  . PELVIC LAPAROSCOPY  1990  . ROTATOR CUFF REPAIR Right 09/2011   Dr. Tamera Punt  . ROTATOR CUFF REPAIR Left 11/11/13   Dr.  Tamera Punt   . SEPTOPLASTY    . UMBILICAL HERNIA REPAIR  2010   Social History   Social History  . Marital status: Married    Spouse name: N/A  . Number of children: N/A  . Years of education: N/A   Occupational History  . Retired    Social History Main Topics  . Smoking status: Never Smoker  . Smokeless tobacco: Never Used  . Alcohol use No  . Drug use: No  . Sexual activity: No   Other Topics Concern  . None   Social History Narrative  . None   Allergies  Allergen Reactions  . Cefixime Swelling  . Celebrex [Celecoxib] Swelling  . Gabapentin Other (See Comments)    Joint pain  . Levofloxacin Other (See Comments)    Muscle and joint pain  . Other Other (See Comments)    Other Reaction: painful joints TEQUIN. TEQUIN - JOINT AND MUSCLE PAIN  . Penicillins Hives  . Robaxin [Methocarbamol]   . Tomato Hives  . Adhesive [Tape] Rash    Sensitive to EKG leads  . Tetracycline Hcl Rash    Can take Doxy  . Tetracyclines & Related Rash   Family History  Problem Relation Age of Onset  . Rheumatologic disease Mother   . Diabetes Mother   . Heart attack Father   . Rheumatologic disease Sister   . Arthritis Sister        --  Rheumatoid arthritis  . Ovarian cancer Sister 36  . Crohn's disease Brother   . Kidney failure Brother   . Ulcerative colitis Brother   . Glaucoma Brother      Past medical history, social, surgical and family history all reviewed in electronic medical record.  No pertanent information unless stated regarding to the chief complaint.   Review of Systems:Review of systems updated and as accurate as of 11/09/16  No visual changes, nausea, vomiting, diarrhea, constipation, dizziness, abdominal pain, skin rash, fevers, chills, night sweats, weight loss, swollen lymph nodes,  chest pain, shortness of breath, mood changes. Positive  positive headaches,rashes, body, body aches, joint swelling,  Objective  Blood pressure (!) 142/82, pulse (!) 115, weight  237 lb (107.5 kg), last menstrual period 01/16/2002, SpO2 98 %. Systems examined below as of 11/09/16   General: No apparent distress alert and oriented x3 mood and affect normal, dressed appropriately.  HEENT: Pupils equal, extraocular movements intact  Respiratory: Patient's speak in full sentences and does not appear short of breath  Cardiovascular: No lower extremity edema, non tender, no erythema  Skin: Warm dry intact with no signs of infection or rash on extremities or on axial skeleton.  Abdomen: Soft nontender  Neuro: Cranial nerves II through XII are intact, neurovascularly intact in all extremities with 2+ DTRs and 2+ pulses.  Lymph: No lymphadenopathy of posterior or anterior cervical chain or axillae bilaterally.  Gait Antalgic  MSK:  Moderate tender with full range of motion and good stability and symmetric strength and tone of shoulders, elbows, wrist, hip, and ankles bilaterally.  Knee: Bilateral valgus deformity noted. Large thigh to calf ratio.  Tender to palpation over medial and PF joint line.  ROM full in flexion and extension and lower leg rotation. instability with valgus force.  painful patellar compression. Patellar glide with moderate crepitus. Patellar and quadriceps tendons unremarkable. Hamstring and quadriceps strength is normal.  Back exam shows the patient does have tightness in all planes. Forward flexion of 40, which is improved from previous exam. She does have negative straight leg test. Patient though is sore diffusely of the paraspinal musculature of the lumbar spine. Positive Faber test.  Osteopathic findings C6 flexed rotated and side bent left T3 extended rotated and side bent right inhaled third rib L1 flexed rotated and side bent right Sacrum right on right     Impression and Recommendations:     This case required medical decision making of moderate complexity.      Note: This dictation was prepared with Dragon dictation along  with smaller phrase technology. Any transcriptional errors that result from this process are unintentional.

## 2016-11-09 NOTE — Assessment & Plan Note (Signed)
Decision today to treat with OMT was based on Physical Exam  After verbal consent patient was treated with HVLA, ME, FPR techniques in cervical, thoracic, lumbar and sacral areas  Patient tolerated the procedure well with improvement in symptoms  Patient given exercises, stretches and lifestyle modifications  See medications in patient instructions if given  Patient will follow up in 4 weeks 

## 2016-11-09 NOTE — Assessment & Plan Note (Signed)
Sent physical therapy. Does not went any surgical intervention at this time

## 2016-11-13 ENCOUNTER — Telehealth: Payer: Self-pay | Admitting: Obstetrics and Gynecology

## 2016-11-13 NOTE — Telephone Encounter (Signed)
Spoke with patient. Advised she does not need to arrive with a full bladder for her PUS. Can empty her bladder before appointment as needed.  Routing to provider for final review. Patient agreeable to disposition. Will close encounter.

## 2016-11-13 NOTE — Telephone Encounter (Signed)
Patient has PUS scheduled 11/16/2016. Patient received a notice through [facility] stating that she needs to drink 32 ounces of water before arriving. Patient is questioning if she needs to do this?

## 2016-11-14 ENCOUNTER — Other Ambulatory Visit: Payer: Medicare Other

## 2016-11-16 ENCOUNTER — Ambulatory Visit (INDEPENDENT_AMBULATORY_CARE_PROVIDER_SITE_OTHER): Payer: Medicare Other

## 2016-11-16 ENCOUNTER — Ambulatory Visit (INDEPENDENT_AMBULATORY_CARE_PROVIDER_SITE_OTHER): Payer: Medicare Other | Admitting: Obstetrics and Gynecology

## 2016-11-16 ENCOUNTER — Encounter: Payer: Self-pay | Admitting: Obstetrics and Gynecology

## 2016-11-16 VITALS — BP 164/82 | HR 90 | Ht 60.0 in | Wt 239.0 lb

## 2016-11-16 DIAGNOSIS — Z8041 Family history of malignant neoplasm of ovary: Secondary | ICD-10-CM | POA: Diagnosis not present

## 2016-11-16 DIAGNOSIS — D219 Benign neoplasm of connective and other soft tissue, unspecified: Secondary | ICD-10-CM | POA: Diagnosis not present

## 2016-11-16 LAB — TIQ-NTM

## 2016-11-16 LAB — HEAVY METALS PANEL, BLOOD

## 2016-11-16 NOTE — Progress Notes (Signed)
Patient ID: ARLYS SCATENA, female   DOB: [birth year], 66 y.o.   MRN: [id number] GYNECOLOGY  VISIT   HPI: 66 y.o.   Married  Caucasian  female   G2P2002 with Patient's last menstrual period was 01/16/2002.   here for pelvic ultrasound to assess ovaries.    Sister had ovarian cancer.   Pelvic ultrasound done 10/28/15: Stable fibroids including left cervical fibroid. Small 6 mm intracervical mass consistent with probable polyp. Normal ovaries.  No free fluid.  Endometrial biopsy in the past has not been successful.  She denies any vaginal bleeding.  Has chronic RLQ pain for years.   She is currently doing a work up for a possible metal allergy.  Has dental implants.  Seeing her PCP regularly. Has an appointment in a couple of weeks.  Not certain if her mother received DES in the past. No clear information other than she was hospitalized and treated for nausea.  GYNECOLOGIC HISTORY: Patient's last menstrual period was 01/16/2002. Contraception:  Postmenopausal Menopausal hormone therapy: n/a Last mammogram: 10/25/15 BIRADS1:neg  Last pap smear: 07-28-16 Neg, 06-24-14 Neg        OB History    Gravida Para Term Preterm AB Living   _0 SAB TAB Ectopic Multiple Live Births                     Patient Active Problem List   Diagnosis Date Noted  . Abnormal gait 04/10/2016  . Pes anserine bursitis 08/10/2015  . Rupture of left posterior tibialis tendon 03/25/2015  . OSA (obstructive sleep apnea) 03/05/2015  . Navicular fracture of ankle 01/21/2015  . Sinus infection 12/25/2014  . Uric acid arthropathy 03/23/2014  . High plasma oxalate 02/17/2014  . Nonallopathic lesion of cervical region 12/19/2012  . Fibroids 05/28/2012  . Family history of ovarian cancer 05/28/2012  . Strain of quadriceps muscle 04/17/2012  . Nonallopathic lesion of costovertebral region 01/23/2012  . Left shoulder pain 12/20/2011  . Rotator cuff disorder 09/26/2011  . Bilateral foot pain  09/20/2011  . Arthralgia of multiple joints 07/26/2011  . Arthritis of knee, degenerative 07/26/2011  . Arthritis of hand, degenerative 07/26/2011  . Low back pain 07/26/2011  . Vitamin B 12 deficiency 03/05/2011  . TMJ (temporomandibular joint syndrome) 03/05/2011  . Pes planus 03/01/2011  . Right knee pain 03/01/2011  . Greater trochanteric bursitis of left hip 03/01/2011  . Nonallopathic lesion of lumbosacral region 01/27/2011  . Obesity 12/29/2010  . Fatigue 12/29/2010  . Allergic rhinitis, cause unspecified   . Chronic pain   . GERD (gastroesophageal reflux disease)   . Sleep apnea     Past Medical History:  Diagnosis Date  . Abnormal uterine bleeding   . Allergic rhinitis, cause unspecified   . Arthritis of knee, right   . Bursitis of left shoulder   . 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 Chronic pain   . Dyspareunia   . Fibroid   . Foot fracture, left 01/2015   and torn tendons  . GERD (gastroesophageal reflux disease)   . HTN (hypertension)   . Kidney stones   . Sleep apnea   . Urinary incontinence     Past Surgical History:  Procedure Laterality Date  .  dilatation and curettage  2000  . CARPAL TUNNEL RELEASE  1998  . CATARACT EXTRACTION Bilateral   . dental implants  2010  . ESOPHAGEAL DILATION  2010  .  HAMMER TOE SURGERY     rt  . PELVIC LAPAROSCOPY  1990  . ROTATOR CUFF REPAIR Right 09/2011   Dr. Tamera Punt  . ROTATOR CUFF REPAIR Left 11/11/13   Dr. Tamera Punt   . SEPTOPLASTY    . UMBILICAL HERNIA REPAIR  2010    Current Outpatient Prescriptions  Medication Sig Dispense Refill  . Cholecalciferol (VITAMIN D3) 5000 units CAPS Take 5,000 Units by mouth every other day.     . Chromium Picolinate 500 MCG CAPS Take 1 capsule by mouth 2 (two) times daily.    . Cyanocobalamin (B-12) 1000 MCG/ML KIT Inject 1 Dose as directed every 14 (fourteen) days. 6 kit 3  . Multiple Vitamin (MULTIVITAMIN) tablet Take 1 tablet by mouth daily.     . Needles & Syringes  MISC 1 Can by Does not apply route once a week. Dispense meals for B12 IM injections please thank you 10 each 3  . Probiotic Product (PROBIOTIC ADVANCED PO) Take by mouth. Patient states taking 5 billion, one capsule daily.    . traMADol (ULTRAM) 50 MG tablet Take 1 tablet (50 mg total) by mouth every 12 (twelve) hours as needed. 60 tablet 1  . vitamin C (ASCORBIC ACID) 500 MG tablet Take 500 mg by mouth 2 (two) times daily.      No current facility-administered medications for this visit.      ALLERGIES: Cefixime; Celebrex [celecoxib]; Gabapentin; Levofloxacin; Other; Penicillins; Robaxin [methocarbamol]; Tomato; Adhesive [tape]; Tetracycline hcl; and Tetracyclines & related  Family History  Problem Relation Age of Onset  . Rheumatologic disease Mother   . Diabetes Mother   . Heart attack Father   . Rheumatologic disease Sister   . Arthritis Sister        --Rheumatoid arthritis  . Ovarian cancer Sister 32  . Crohn's disease Brother   . Kidney failure Brother   . Ulcerative colitis Brother   . Glaucoma Brother     Social History   Social History  . Marital status: Married    Spouse name: N/A  . Number of children: N/A  . Years of education: N/A   Occupational History  . Retired    Social History Main Topics  . Smoking status: Never Smoker  . Smokeless tobacco: Never Used  . Alcohol use No  . Drug use: No  . Sexual activity: No   Other Topics Concern  . Not on file   Social History Narrative  . No narrative on file    ROS:  Pertinent items are noted in HPI.  PHYSICAL EXAMINATION:    BP (!) 164/82 (BP Location: Left Arm, Patient Position: Sitting, Cuff Size: Large)   Pulse 90   Ht 5' (1.524 m)   Wt 239 lb (108.4 kg)   LMP 01/16/2002   BMI 46.68 kg/m     General appearance: alert, cooperative and appears stated age   Pelvic US: Uterus with 3 fibroids, including largest which is a 2.64 cm cervical fibroid.  Fibroids stable.  EMS 2.98 mm.  Cervical  echogenicity 12 x 7 mm, previously seen.  Ovaries normal. No free fluid.  ASSESSMENT  FH ovarian cancer.  Normal ovaries today. Fibroids - stable.  Echogenic focus in the cervical canal - I suspect this is a polyp or prominent fold of the cervical mucosa.  PLAN  We discussed ovarian cancer risk, uterine fibroids, and the endocervical mass. No further evaluation recommended.  She does not meet criteria for genetic testing.  We will continue to  do yearly pelvic ultrasounds as pelvic exam is limited for her.  She will call for any postmenopausal bleeding or change in her abdominal pain.  FU for annual exam.    An After Visit Summary was printed and given to the patient.  __15____ minutes face to face time of which over 50% was spent in counseling.

## 2016-11-16 NOTE — Progress Notes (Signed)
Encounter reviewed by Dr. Brook Amundson C. Silva.  

## 2016-11-17 ENCOUNTER — Encounter: Payer: Self-pay | Admitting: Family Medicine

## 2016-11-17 DIAGNOSIS — Z23 Encounter for immunization: Secondary | ICD-10-CM | POA: Diagnosis not present

## 2016-11-19 ENCOUNTER — Encounter (HOSPITAL_BASED_OUTPATIENT_CLINIC_OR_DEPARTMENT_OTHER): Payer: Medicare Other

## 2016-11-30 ENCOUNTER — Ambulatory Visit: Payer: Medicare Other | Admitting: Family Medicine

## 2016-12-01 ENCOUNTER — Ambulatory Visit: Payer: Medicare Other | Attending: Family Medicine | Admitting: Physical Therapy

## 2016-12-01 ENCOUNTER — Encounter: Payer: Self-pay | Admitting: Physical Therapy

## 2016-12-01 DIAGNOSIS — R262 Difficulty in walking, not elsewhere classified: Secondary | ICD-10-CM

## 2016-12-01 DIAGNOSIS — G8929 Other chronic pain: Secondary | ICD-10-CM | POA: Insufficient documentation

## 2016-12-01 DIAGNOSIS — M25562 Pain in left knee: Secondary | ICD-10-CM | POA: Diagnosis not present

## 2016-12-01 DIAGNOSIS — M25662 Stiffness of left knee, not elsewhere classified: Secondary | ICD-10-CM

## 2016-12-01 DIAGNOSIS — M6281 Muscle weakness (generalized): Secondary | ICD-10-CM

## 2016-12-01 DIAGNOSIS — M25561 Pain in right knee: Secondary | ICD-10-CM | POA: Insufficient documentation

## 2016-12-01 DIAGNOSIS — M25661 Stiffness of right knee, not elsewhere classified: Secondary | ICD-10-CM | POA: Diagnosis not present
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
## 2016-12-04 ENCOUNTER — Encounter: Payer: Self-pay | Admitting: Physical Therapy

## 2016-12-04 ENCOUNTER — Ambulatory Visit: Payer: Medicare Other | Admitting: Physical Therapy

## 2016-12-04 ENCOUNTER — Ambulatory Visit (INDEPENDENT_AMBULATORY_CARE_PROVIDER_SITE_OTHER): Payer: Medicare Other | Admitting: Family Medicine

## 2016-12-04 ENCOUNTER — Encounter: Payer: Self-pay | Admitting: Family Medicine

## 2016-12-04 VITALS — BP 170/90 | HR 113 | Ht 60.0 in | Wt 240.0 lb

## 2016-12-04 DIAGNOSIS — M25561 Pain in right knee: Secondary | ICD-10-CM

## 2016-12-04 DIAGNOSIS — R262 Difficulty in walking, not elsewhere classified: Secondary | ICD-10-CM | POA: Diagnosis not present

## 2016-12-04 DIAGNOSIS — M25562 Pain in left knee: Secondary | ICD-10-CM | POA: Diagnosis not present

## 2016-12-04 DIAGNOSIS — G8929 Other chronic pain: Secondary | ICD-10-CM

## 2016-12-04 DIAGNOSIS — M999 Biomechanical lesion, unspecified: Secondary | ICD-10-CM | POA: Diagnosis not present

## 2016-12-04 DIAGNOSIS — M25662 Stiffness of left knee, not elsewhere classified: Secondary | ICD-10-CM | POA: Diagnosis not present

## 2016-12-04 DIAGNOSIS — M2550 Pain in unspecified joint: Secondary | ICD-10-CM | POA: Diagnosis not present

## 2016-12-04 DIAGNOSIS — M25661 Stiffness of right knee, not elsewhere classified: Secondary | ICD-10-CM | POA: Diagnosis not present

## 2016-12-04 DIAGNOSIS — M6281 Muscle weakness (generalized): Secondary | ICD-10-CM

## 2016-12-04 NOTE — Progress Notes (Signed)
[name] [place], [place] [address] Phone: [phone] Subjective:      CC: Continued pain  [id number]  Kerry [name] is a 66 y.o. female coming in with complaint of pain.  We have seen patient multiple times.  Does have degenerative disc disease at multiple levels as well as other joint disease.  Seems to be more of a polyarthralgia that is likely a seronegative rheumatoid arthritis.  Patient is very concerned at this time of the possibility of more of a metal allergy.  Continuing to have difficulty getting information on patient's dental implants but bring this information today to see if I could potentially contact them.  Patient states that since she has had removal of some of this she has noticed some mild improvement.  Patient still is having discomfort overall.  And can walk more than 200 feet without pain.  The patient is missing a different family events including [place] the country secondary to the discomfort and pain     Past Medical History:  Diagnosis Date  . Abnormal uterine bleeding   . Allergic rhinitis, cause unspecified   . Arthritis of knee, right   . Bursitis of left shoulder   . 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 Chronic pain   . Dyspareunia   . Fibroid   . Foot fracture, left 01/2015   and torn tendons  . GERD (gastroesophageal reflux disease)   . HTN (hypertension)   . Kidney stones   . Sleep apnea   . Urinary incontinence    Past Surgical History:  Procedure Laterality Date  .  dilatation and curettage  2000  . CARPAL TUNNEL RELEASE  1998  . CATARACT EXTRACTION Bilateral   . dental implants  2010  . ESOPHAGEAL DILATION  2010  . HAMMER TOE SURGERY     rt  . PELVIC LAPAROSCOPY  1990  . ROTATOR CUFF REPAIR Right 09/2011   Dr. Tamera Punt  . ROTATOR CUFF REPAIR Left 11/11/13   Dr. Tamera Punt   . SEPTOPLASTY    . SHOULDER ARTHROSCOPY WITH ROTATOR CUFF REPAIR Right 10/09/2011   Performed by  Nita Sells, MD at [facility]  . UMBILICAL HERNIA REPAIR  2010   Social History   Socioeconomic History  . Marital status: Married    Spouse name: None  . Number of children: None  . Years of education: None  . Highest education level: None  Social Needs  . Financial resource strain: None  . Food insecurity - worry: None  . Food insecurity - inability: None  . Transportation needs - medical: None  . Transportation needs - non-medical: None  Occupational History  . Occupation: Retired  Tobacco Use  . Smoking status: Never Smoker  . Smokeless tobacco: Never Used  Substance and Sexual Activity  . Alcohol use: No    Alcohol/week: 0.0 oz  . Drug use: No  . Sexual activity: No    Partners: Male    Birth control/protection: Post-menopausal  Other Topics Concern  . None  Social History Narrative  . None   Allergies  Allergen Reactions  . Cefixime Swelling  . Celebrex [Celecoxib] Swelling  . Gabapentin Other (See Comments)    Joint pain  . Levofloxacin Other (See Comments)    Muscle and joint pain  . Other Other (See Comments)    TEQUIN. TEQUIN - JOINT AND MUSCLE PAIN Other Reaction: painful joints TEQUIN. [place] - JOINT  AND MUSCLE PAIN  . Penicillins Hives  . Robaxin [Methocarbamol]   . Tomato Hives  . Adhesive [Tape] Rash    Sensitive to EKG leads  . Tetracycline Hcl Rash    Can take Doxy  . Tetracyclines & Related Rash   Family History  Problem Relation Age of Onset  . Rheumatologic disease Mother   . Diabetes Mother   . Heart attack Father   . Rheumatologic disease Sister   . Arthritis Sister        --Rheumatoid arthritis  . Ovarian cancer Sister 60  . Crohn's disease Brother   . Kidney failure Brother   . Ulcerative colitis Brother   . Glaucoma Brother      Past medical history, social, surgical and family history all reviewed in electronic medical record.  No pertanent information unless stated regarding to the chief  complaint.   Review of Systems:Review of systems updated and as accurate as of 12/04/16  No  visual changes, nausea, vomiting, diarrhea, constipation, dizziness, abdominal pain, skin rash, fevers, chills, night sweats, weight loss, swollen lymph nodes,chest pain, shortness of breath, mood changes.  Positive headaches, muscle aches, body aches, joint swelling  Objective  Blood pressure (!) 170/90, pulse (!) 113, height 5' (1.524 m), weight 240 lb (108.9 kg), last menstrual period 01/16/2002, SpO2 97 %. Systems examined below as of 12/04/16   General: No apparent distress alert and oriented x3 mood and affect normal, dressed appropriately.  HEENT: Pupils equal, extraocular movements intact  Respiratory: Patient's speak in full sentences and does not appear short of breath  Cardiovascular: No lower extremity edema, non tender, no erythema  Skin: Warm dry intact with no signs of infection or rash on extremities or on axial skeleton.  Abdomen: Soft nontender  Neuro: Cranial nerves II through XII are intact, neurovascularly intact in all extremities with 2+ DTRs and 2+ pulses.  Lymph: No lymphadenopathy of posterior or anterior cervical chain or axillae bilaterally.  Gait severely antalgic MSK:  Non tender with full range of motion and good stability and symmetric strength and tone of shoulders, elbows, wrist, hip, knee and ankles bilaterally.  Arthritic changes in multiple joints Increasing knee instability bilaterally  Back exam shows significant tightness.  Loss of range of motion.  Forward flexion of 40 degrees only.  Minimal side bending and rotation.  Unable to do [facility] test secondary to discomfort and pain.  Osteopathic findings C6 flexed rotated and side bent left T3 extended rotated and side bent right inhaled third rib T9 extended rotated and side bent left L65flexed rotated and side bent left  Sacrum right on right     Impression and Recommendations:     This case required  medical decision making of moderate complexity.      Note: This dictation was prepared with Dragon dictation along with smaller phrase technology. Any transcriptional errors that result from this process are unintentional.

## 2016-12-04 NOTE — Patient Instructions (Signed)
Good to see you  I will call and see if I can get any info I would move forward maybe with your testing and consider also the [facility] group as well.  Lets watch the hand and consider an injection as well.  See me again in 4 weeks

## 2016-12-04 NOTE — Therapy (Signed)
[place], [place], [address] Phone: [phone]   Fax:  [phone]  Physical Therapy Treatment  Patient Details  Name: Kerry [name] MRN: [id number] Date of Birth: 1950-11-24 Referring Provider: Dr. Hulan Saas   Encounter Date: 12/04/2016  PT End of Session - 12/04/16 1202    Visit Number  2    Number of Visits  12    Date for PT Re-Evaluation  01/12/17    PT Start Time  6948    PT Stop Time  1227    PT Time Calculation (min)  42 min    Activity Tolerance  Patient tolerated treatment well    Behavior During Therapy  [facility] for tasks assessed/performed       Past Medical History:  Diagnosis Date  . Abnormal uterine bleeding   . Allergic rhinitis, cause unspecified   . Arthritis of knee, right   . Bursitis of left shoulder   . 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 Chronic pain   . Dyspareunia   . Fibroid   . Foot fracture, left 01/2015   and torn tendons  . GERD (gastroesophageal reflux disease)   . HTN (hypertension)   . Kidney stones   . Sleep apnea   . Urinary incontinence     Past Surgical History:  Procedure Laterality Date  .  dilatation and curettage  2000  . CARPAL TUNNEL RELEASE  1998  . CATARACT EXTRACTION Bilateral   . dental implants  2010  . ESOPHAGEAL DILATION  2010  . HAMMER TOE SURGERY     rt  . PELVIC LAPAROSCOPY  1990  . ROTATOR CUFF REPAIR Right 09/2011   Dr. Tamera Punt  . ROTATOR CUFF REPAIR Left 11/11/13   Dr. Tamera Punt   . SEPTOPLASTY    . SHOULDER ARTHROSCOPY WITH ROTATOR CUFF REPAIR Right 10/09/2011   Performed by Nita Sells, MD at [facility]  . UMBILICAL HERNIA REPAIR  2010    There were no vitals filed for this visit.  Subjective Assessment - 12/04/16 1146    Subjective  Was walking around the store after eval  ([employer]) for about 2 hours.  Knees hurt alot after that.  Pain resolved now.  L ankle was very swollen, torn tendon  (Posterior Tib?).      Currently in Pain?  Yes    Pain Score  4     Pain Orientation  Right;Left          [facility] Adult PT Treatment/Exercise - 12/04/16 0001      Self-Care   Self-Care  Other Self-Care Comments    Other Self-Care Comments   HEP and muscle fatigue      Knee/Hip Exercises: Stretches   Active Hamstring Stretch  Both;2 reps;30 seconds    Knee: Self-Stretch to increase Flexion  Both;5 reps;20 seconds    Other Knee/Hip Stretches  knee to chest added knee ext and flexion       Knee/Hip Exercises: Aerobic   Nustep  L3 LE only, too heavy, reduced to L1 for remaining 3       Knee/Hip Exercises: Supine   Quad Sets  Strengthening;Both;1 set;15 reps    [employer] Limitations  5 sec     Short Arc Quad Sets  Strengthening;Both;1 set;20 reps    Heel Slides  AAROM;Both;1 set    Straight Leg Raises  Strengthening;Both;1 set;10 reps         PT Long Term Goals -  12/01/16 1206      PT LONG TERM GOAL #1   Title  Patient will be I with HEP for LE strength and knee AROM.     Time  6    Period  Weeks    Status  New    Target Date  01/12/17      PT LONG TERM GOAL #2   Title  Pt will be able to improve her FOTO score to less than 60% impaired to show improved functional mobility.     Time  6    Period  Weeks    Status  New    Target Date  01/12/17      PT LONG TERM GOAL #3   Title  Pt will be able to do a portion of dusting, cooking, light housework in standing, up to 15-20 min at a time without sititng down.     Time  6    Status  New    Target Date  01/12/17      PT LONG TERM GOAL #4   Title  Pt will be able to walk at gait speed with cane and improved confidence in Rt  knee.  (>2.62 feet/sec)     Baseline  slower than normal, NT on eval     Time  6    Period  Weeks    Status  New    Target Date  01/12/17      PT LONG TERM GOAL #5   Title  Pt will increase hip strength to >4/5 for better gait stability    Baseline  appears 3/5 or less     Time  6    Period   Weeks    Status  New    Target Date  01/12/17            Plan - 12/04/16 1237    Clinical Impression Statement  Patient tolerated mat exercises well, reports no pain following simple flexibility and strengthening without increased pain. She was given HEP.  May try to get another visit in this week.      PT Next Visit Plan  measure gait speed, balance screen? check hip MMT abd and ext     PT Home Exercise Plan  none yet, verbal for LAQ and quad sets     Consulted and Agree with Plan of Care  Patient       Patient will benefit from skilled therapeutic intervention in order to improve the following deficits and impairments:     Visit Diagnosis: Chronic pain of left knee  Chronic pain of right knee  Stiffness of left knee, not elsewhere classified  Stiffness of right knee, not elsewhere classified  Difficulty in walking, not elsewhere classified  Muscle weakness (generalized)     Problem List Patient Active Problem List   Diagnosis Date Noted  . Abnormal gait 04/10/2016  . Pes anserine bursitis 08/10/2015  . Rupture of left posterior tibialis tendon 03/25/2015  . OSA (obstructive sleep apnea) 03/05/2015  . Navicular fracture of ankle 01/21/2015  . Sinus infection 12/25/2014  . Uric acid arthropathy 03/23/2014  . High plasma oxalate 02/17/2014  . Nonallopathic lesion of cervical region 12/19/2012  . Fibroids 05/28/2012  . Family history of ovarian cancer 05/28/2012  . Strain of quadriceps muscle 04/17/2012  . Nonallopathic lesion of costovertebral region 01/23/2012  . Left shoulder pain 12/20/2011  . Rotator cuff disorder 09/26/2011  . Bilateral foot pain 09/20/2011  . Arthralgia of multiple joints  07/26/2011  . Arthritis of knee, degenerative 07/26/2011  . Arthritis of hand, degenerative 07/26/2011  . Low back pain 07/26/2011  . Vitamin B 12 deficiency 03/05/2011  . TMJ (temporomandibular joint syndrome) 03/05/2011  . Pes planus 03/01/2011  . Right knee pain  03/01/2011  . Greater trochanteric bursitis of left hip 03/01/2011  . Nonallopathic lesion of lumbosacral region 01/27/2011  . Obesity 12/29/2010  . Fatigue 12/29/2010  . Allergic rhinitis, cause unspecified   . Chronic pain   . GERD (gastroesophageal reflux disease)   . Sleep apnea     PAA,JENNIFER 12/04/2016, 12:43 PM  [place], [place], [address] Phone: [phone]   Fax:  [phone]  Name: Kerry [name] MRN: [id number] Date of Birth: April 26, 1950  Raeford Razor, PT 12/04/16 12:43 PM Phone: [phone] Fax: [phone]

## 2016-12-04 NOTE — Assessment & Plan Note (Addendum)
Decision today to treat with OMT was based on Physical Exam  After verbal consent patient was treated with HVLA, ME, FPR techniques in cervical, thoracic, rib, lumbar and sacral areas  Patient tolerated the procedure well with improvement in symptoms  Patient given exercises, stretches and lifestyle modifications  See medications in patient instructions if given  Patient will follow up in 4 weeks 

## 2016-12-04 NOTE — Assessment & Plan Note (Signed)
Significant arthritis of multiple joints.  Seronegative rheumatoid arthritis is within the differential.  We will call and see if we can get any other information about the possible dental implants.  Encouraged her to go along and get the metal testing that was ordered at her last exam.  We discussed other medications again including sulfasalazine.  Patient wants to hold on this at this moment.  The patient does not want any joint replacements until she knows further about the metal.  Follow-up again in 4 weeks

## 2016-12-12 ENCOUNTER — Encounter: Payer: Medicare Other | Admitting: Physical Therapy

## 2016-12-13 ENCOUNTER — Ambulatory Visit (INDEPENDENT_AMBULATORY_CARE_PROVIDER_SITE_OTHER): Payer: Medicare Other | Admitting: Allergy and Immunology

## 2016-12-13 VITALS — BP 162/82 | HR 96 | Temp 98.8°F | Resp 16 | Ht 65.5 in | Wt 238.0 lb

## 2016-12-13 DIAGNOSIS — L230 Allergic contact dermatitis due to metals: Secondary | ICD-10-CM | POA: Diagnosis not present

## 2016-12-13 NOTE — Progress Notes (Signed)
Dear Dr. Tamala Julian,  Thank you for referring Kerry [name] to the [facility] on 12/13/2016.   Below is a summation of this patient's evaluation and recommendations.  Thank you for your referral. I will keep you informed about this patient's response to treatment.   If you have any questions please do not hesitate to contact me.   Sincerely,  Jiles Prows, MD Allergy / Immunology [place]   ______________________________________________________________________    NEW PATIENT NOTE  Referring Provider: [name], DO Primary Provider: Street, [name], MD Date of office visit: 12/13/2016    Subjective:   Chief Complaint:  Kerry [name] (DOB: 06/30/1950) is a 66 y.o. female who presents to the clinic on 12/13/2016 with a chief complaint of No chief complaint on file. Marland Kitchen     HPI: [name] presents to this clinic in evaluation of possible metal hypersensitivity.  She has a issue with chronic joint pain developing since 2011 and she feels as though there is a temporal relationship between the development of this issue and placement of various metals in her body with surgical mesh and dental implants.  Most recently she had a oral dental bar placed in her mouth that caused significant inflammation of her oral mucosa at areas of contact and she had this removed and her mouth is actually better and since she had this bar removed her joint pain is also somewhat better.  This removal occurred towards the tail end of August.  She does not have a history of developing sensitivity to various pieces of jewelry but in essence she really does not wear much jewelry.  She does have a history of hayfever in the past that has for the most part resolved.  Otherwise, she has no associated atopic disease or any other type of systemic or constitutional symptoms associated with metal exposure.  Past  Medical History:  Diagnosis Date  . Abnormal uterine bleeding   . Allergic rhinitis, cause unspecified   . Arthritis of knee, right   . Bursitis of left shoulder   . 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 Chronic pain   . Dyspareunia   . Fibroid   . Foot fracture, left 01/2015   and torn tendons  . GERD (gastroesophageal reflux disease)   . HTN (hypertension)   . Kidney stones   . Sleep apnea   . Urinary incontinence     Past Surgical History:  Procedure Laterality Date  .  dilatation and curettage  2000  . CARPAL TUNNEL RELEASE  1998  . CATARACT EXTRACTION Bilateral   . dental implants  2010  . ESOPHAGEAL DILATION  2010  . HAMMER TOE SURGERY     rt  . PELVIC LAPAROSCOPY  1990  . ROTATOR CUFF REPAIR Right 09/2011   Dr. Tamera Punt  . ROTATOR CUFF REPAIR Left 11/11/13   Dr. Tamera Punt   . SEPTOPLASTY    . UMBILICAL HERNIA REPAIR  2010    Allergies as of 12/13/2016      Reactions   Cefixime Swelling   Celebrex [celecoxib] Swelling   Gabapentin Other (See Comments)   Joint pain   Levofloxacin Other (See Comments)   Muscle and joint pain   Other Other (See Comments)   TEQUIN. TEQUIN - JOINT AND MUSCLE PAIN Other Reaction: painful joints TEQUIN. TEQUIN - JOINT AND MUSCLE PAIN   Penicillins Hives   Robaxin [  methocarbamol]    Tomato Hives   Adhesive [tape] Rash   Sensitive to EKG leads   Tetracycline Hcl Rash   Can take Doxy   Tetracyclines & Related Rash      Medication List      B-12 1000 MCG/ML Kit Inject 1 Dose as directed every 14 (fourteen) days.   Chromium Picolinate 500 MCG Caps Take 1 capsule by mouth 2 (two) times daily.   multivitamin tablet Take 1 tablet by mouth daily.   Needles & Syringes Misc 1 Can by Does not apply route once a week. Dispense meals for B12 IM injections please thank you   PROBIOTIC ADVANCED PO Take by mouth. Patient states taking 5 billion, one capsule daily.   traMADol 50 MG tablet Commonly known as:  ULTRAM Take 1  tablet (50 mg total) by mouth every 12 (twelve) hours as needed.   vitamin C 500 MG tablet Commonly known as:  ASCORBIC ACID Take 500 mg by mouth 2 (two) times daily.   Vitamin D3 5000 units Caps Take 5,000 Units by mouth every other day.       Review of systems negative except as noted in HPI / PMHx or noted below:  Review of Systems  Constitutional: Negative.   HENT: Negative.   Eyes: Negative.   Respiratory: Negative.   Cardiovascular: Negative.   Gastrointestinal: Negative.   Genitourinary: Negative.   Musculoskeletal: Negative.   Skin: Negative.   Neurological: Negative.   Endo/Heme/Allergies: Negative.   Psychiatric/Behavioral: Negative.     Family History  Problem Relation Age of Onset  . Rheumatologic disease Mother   . Diabetes Mother   . Heart attack Father   . Rheumatologic disease Sister   . Arthritis Sister        --Rheumatoid arthritis  . Ovarian cancer Sister 91  . Crohn's disease Brother   . Kidney failure Brother   . Ulcerative colitis Brother   . Glaucoma Brother     Social History   Socioeconomic History  . Marital status: Married    Spouse name: Not on file  . Number of children: Not on file  . Years of education: Not on file  . Highest education level: Not on file  Social Needs  . Financial resource strain: Not on file  . Food insecurity - worry: Not on file  . Food insecurity - inability: Not on file  . Transportation needs - medical: Not on file  . Transportation needs - non-medical: Not on file  Occupational History  . Occupation: Retired  Tobacco Use  . Smoking status: Never Smoker  . Smokeless tobacco: Never Used  Substance and Sexual Activity  . Alcohol use: No    Alcohol/week: 0.0 oz  . Drug use: No  . Sexual activity: No    Partners: Male    Birth control/protection: Post-menopausal  Other Topics Concern  . Not on file  Social History Narrative  . Not on file    Environmental and Social history  Lives in a  house with a dry environment, no animals located inside the household, no carpet in the bedroom, no plastic on the bed, plastic on the pillow, and no smoking ongoing with inside the household.  Objective:   Vitals:   12/13/16 1337  BP: (!) 162/82  Pulse: 96  Resp: 16  Temp: 98.8 F (37.1 C)   Height: 5' 5.5" (166.4 cm) Weight: 238 lb (108 kg)  Physical Exam  Constitutional: She is well-developed, well-nourished, and in no  distress.  HENT:  Head: Normocephalic.  Right Ear: Tympanic membrane, external ear and ear canal normal.  Left Ear: Tympanic membrane, external ear and ear canal normal.  Nose: Nose normal. No mucosal edema or rhinorrhea.  Mouth/Throat: Uvula is midline, oropharynx is clear and moist and mucous membranes are normal. No oropharyngeal exudate.  Eyes: Conjunctivae are normal.  Neck: Trachea normal. No tracheal tenderness present. No tracheal deviation present. No thyromegaly present.  Cardiovascular: Normal rate, regular rhythm, S1 normal, S2 normal and normal heart sounds.  No murmur heard. Pulmonary/Chest: Breath sounds normal. No stridor. No respiratory distress. She has no wheezes. She has no rales.  Musculoskeletal: She exhibits no edema.  Lymphadenopathy:       Head (right side): No tonsillar adenopathy present.       Head (left side): No tonsillar adenopathy present.    She has no cervical adenopathy.  Neurological: She is alert. Gait normal.  Skin: No rash noted. She is not diaphoretic. No erythema. Nails show no clubbing.  Psychiatric: Mood and affect normal.    Diagnostics: Patch testing was placed on her back with initial read to occur in 48 hours.  Patch testing included chromium chloride, cobalt chloride hexahydrate, Molybden chloride, tantal, nickel sulfate hexahydrate, potassium dichromate, copper sulfate pentahydrate, titanium, manganese chloride.  Assessment and Plan:    1. Allergic contact dermatitis due to metals    Vaughan Basta will return to  this clinic in 48 hours for her initial read to examine for the possibility of a contact dermatitis contributing to her symptoms.  Jiles Prows, MD Allergy / Immunology [facility]

## 2016-12-14 ENCOUNTER — Encounter: Payer: Self-pay | Admitting: Physical Therapy

## 2016-12-15 ENCOUNTER — Encounter: Payer: Medicare Other | Admitting: Physical Therapy

## 2016-12-15 ENCOUNTER — Ambulatory Visit: Payer: Medicare Other | Admitting: Family Medicine

## 2016-12-15 ENCOUNTER — Encounter: Payer: Self-pay | Admitting: Allergy and Immunology

## 2016-12-15 DIAGNOSIS — L230 Allergic contact dermatitis due to metals: Secondary | ICD-10-CM

## 2016-12-16 ENCOUNTER — Encounter: Payer: Self-pay | Admitting: Family Medicine

## 2016-12-16 DIAGNOSIS — L230 Allergic contact dermatitis due to metals: Secondary | ICD-10-CM | POA: Insufficient documentation

## 2016-12-16 NOTE — Progress Notes (Signed)
Follow-up Note  Referring Provider: Street, [name], * Primary Provider: Street, [name], MD Date of Office Visit: 12/15/2016  Subjective:   Kerry [name] (DOB: 01-12-1951) is a 66 y.o. female who returns to the [facility] on 12/15/2016 in re-evaluation of the following:  HPI: Kerry [name] is a 66 year old female patient who presents to the clinic today for a metals patch reading. Her patch was placed 48 hours prior to this appointment. Upon entering the exam room, Kerry [name] presents a written document of events occurring after the patch was placed on Wednesday morning including left shoulder tightness, left arm numbness and tingling, left lower abdominal aching, muscle aches, and temperature elevated 1.5 degrees above normal. The patch had become loosened and crumpled on Thursday and her husband taped it back into place as he was able. She continued to have body aches and pains on Thursday along with mental confusion and agitation. Finally, today she reports most of the symptoms are dissipating.   After removing the patch there are 3 distinct areas that may have started to react. Cobalt chloride hexahydrate 1%, - +3 reaction Molybdenum chloride 5%,   +2 reaction  Tantal 1%    +2 reaction  Information about these specific metals was provided to Kerry [name]. She will return to the clinic on Wednesday, December 20, 2016 at 1:30 for the final patch reading,  Allergies as of 12/15/2016      Reactions   Cefixime Swelling   Celebrex [celecoxib] Swelling   Gabapentin Other (See Comments)   Joint pain   Levofloxacin Other (See Comments)   Muscle and joint pain   Other Other (See Comments)   TEQUIN. TEQUIN - JOINT AND MUSCLE PAIN Other Reaction: painful joints TEQUIN. TEQUIN - JOINT AND MUSCLE PAIN   Penicillins Hives   Robaxin [methocarbamol]    Tomato Hives   Adhesive [tape] Rash   Sensitive to EKG leads   Tetracycline Hcl Rash   Can take Doxy   Tetracyclines & Related Rash      Medication List      B-12 1000 MCG/ML Kit Inject 1 Dose as directed every 14 (fourteen) days.   Chromium Picolinate 500 MCG Caps Take 1 capsule by mouth 2 (two) times daily.   multivitamin tablet Take 1 tablet by mouth daily.   Needles & Syringes Misc 1 Can by Does not apply route once a week. Dispense meals for B12 IM injections please thank you   PROBIOTIC ADVANCED PO Take by mouth. Patient states taking 5 billion, one capsule daily.   traMADol 50 MG tablet Commonly known as:  ULTRAM Take 1 tablet (50 mg total) by mouth every 12 (twelve) hours as needed.   vitamin C 500 MG tablet Commonly known as:  ASCORBIC ACID Take 500 mg by mouth 2 (two) times daily.   Vitamin D3 5000 units Caps Take 5,000 Units by mouth every other day.       Past Medical History:  Diagnosis Date  . Abnormal uterine bleeding   . Allergic rhinitis, cause unspecified   . Arthritis of knee, right   . Bursitis of left shoulder   . 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 Chronic pain   . Dyspareunia   . Fibroid   . Foot fracture, left 01/2015   and torn tendons  . GERD (gastroesophageal reflux disease)   . HTN (hypertension)   . Kidney stones   . Sleep apnea   . Urinary incontinence  Past Surgical History:  Procedure Laterality Date  .  dilatation and curettage  2000  . CARPAL TUNNEL RELEASE  1998  . CATARACT EXTRACTION Bilateral   . dental implants  2010  . ESOPHAGEAL DILATION  2010  . HAMMER TOE SURGERY     rt  . PELVIC LAPAROSCOPY  1990  . ROTATOR CUFF REPAIR Right 09/2011   Dr. Tamera Punt  . ROTATOR CUFF REPAIR Left 11/11/13   Dr. Tamera Punt   . SEPTOPLASTY    . UMBILICAL HERNIA REPAIR  2010    Review of systems negative except as noted in HPI / PMHx or noted below:  Review of Systems  Constitutional: Negative.   HENT: Negative.   Eyes: Negative.   Respiratory: Negative.   Cardiovascular: Negative.   Gastrointestinal: Negative.     Genitourinary: Negative.   Musculoskeletal: Negative.   Skin: Negative.      Objective:    Physical Exam  Constitutional: She is oriented to person, place, and time and well-developed, well-nourished, and in no distress.  Eyes: Conjunctivae are normal.  Neck: Normal range of motion. Neck supple.  Cardiovascular: Normal rate, regular rhythm and normal heart sounds.  S1-S2 normal.  Regular heart rate and rhythm  Pulmonary/Chest: Effort normal and breath sounds normal.  Lungs clear to auscultation  Musculoskeletal: Normal range of motion.  Neurological: She is alert and oriented to person, place, and time.  Skin: Skin is warm and dry.  3 distinct areas of reactivity noted after patch removal      Assessment and Plan:   1. Allergic contact dermatitis due to metals    1. Allergic contact dermatitis due to metals - Kerry [name] will return to the office on Wednesday December 5th at 1:30 for her final patch reading.  Gareth Morgan, [place]  I have provided oversight concerning Gareth Morgan' evaluation and treatment of this patient's health issues addressed during today's encounter. I agree with the assessment and therapeutic plan as outlined in the note.   Signed,   Jiles Prows, MD,  Allergy and Immunology,  [facility].

## 2016-12-16 NOTE — Patient Instructions (Signed)
1. Allergic contact dermatitis due to metals - Kerry [name] will return to the office on Wednesday December 5th at 1:30 for her final patch reading.

## 2016-12-18 ENCOUNTER — Encounter: Payer: Medicare Other | Admitting: Allergy and Immunology

## 2016-12-18 ENCOUNTER — Ambulatory Visit: Payer: Medicare Other | Attending: Family Medicine | Admitting: Physical Therapy

## 2016-12-18 ENCOUNTER — Encounter: Payer: Self-pay | Admitting: Physical Therapy

## 2016-12-18 DIAGNOSIS — M6281 Muscle weakness (generalized): Secondary | ICD-10-CM | POA: Insufficient documentation

## 2016-12-18 DIAGNOSIS — R262 Difficulty in walking, not elsewhere classified: Secondary | ICD-10-CM | POA: Diagnosis not present

## 2016-12-18 DIAGNOSIS — M25661 Stiffness of right knee, not elsewhere classified: Secondary | ICD-10-CM

## 2016-12-18 DIAGNOSIS — M25561 Pain in right knee: Secondary | ICD-10-CM | POA: Diagnosis not present

## 2016-12-18 DIAGNOSIS — M25562 Pain in left knee: Secondary | ICD-10-CM | POA: Diagnosis not present

## 2016-12-18 DIAGNOSIS — G8929 Other chronic pain: Secondary | ICD-10-CM | POA: Diagnosis not present

## 2016-12-18 DIAGNOSIS — M25662 Stiffness of left knee, not elsewhere classified: Secondary | ICD-10-CM | POA: Diagnosis not present

## 2016-12-18 NOTE — Therapy (Signed)
[facility] [place], [place], [address] Phone: [phone]   Fax:  [phone]  Physical Therapy Treatment  Patient Details  Name: Kerry [name] MRN: [id number] Date of Birth: Nov 12, 1950 Referring Provider: Dr. Hulan Saas   Encounter Date: 12/18/2016  PT End of Session - 12/18/16 1812    Visit Number  3    Number of Visits  12    Date for PT Re-Evaluation  01/12/17    PT Start Time  1505    PT Stop Time  1552    PT Time Calculation (min)  47 min    Activity Tolerance  Patient tolerated treatment well    Behavior During Therapy  [facility] for tasks assessed/performed       Past Medical History:  Diagnosis Date  . Abnormal uterine bleeding   . Allergic rhinitis, cause unspecified   . Arthritis of knee, right   . Bursitis of left shoulder   . 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 Chronic pain   . Dyspareunia   . Fibroid   . Foot fracture, left 01/2015   and torn tendons  . GERD (gastroesophageal reflux disease)   . HTN (hypertension)   . Kidney stones   . Sleep apnea   . Urinary incontinence     Past Surgical History:  Procedure Laterality Date  .  dilatation and curettage  2000  . CARPAL TUNNEL RELEASE  1998  . CATARACT EXTRACTION Bilateral   . dental implants  2010  . ESOPHAGEAL DILATION  2010  . HAMMER TOE SURGERY     rt  . PELVIC LAPAROSCOPY  1990  . ROTATOR CUFF REPAIR Right 09/2011   Dr. Tamera Punt  . ROTATOR CUFF REPAIR Left 11/11/13   Dr. Tamera Punt   . SEPTOPLASTY    . UMBILICAL HERNIA REPAIR  2010    There were no vitals filed for this visit.  Subjective Assessment - 12/18/16 1504    Subjective  A few hours after last session,  I went shopping and my knee pain qwent to 8/10.  I used ice for a few days and then used heating pad.    I was able to do the exercises.  Missed 1 session due to metal allergy testiing with a temperature inccrease a degree or 2.    Bending is hard to do.    Currently in Pain?  Yes    Pain Score  8     Pain Orientation  Left;Right    Pain Descriptors / Indicators  Aching;Tightness;Stabbing    Aggravating Factors   weather changes                      [facility] Adult PT Treatment/Exercise - 12/18/16 0001      Berg Balance Test   Sit to Stand  Able to stand without using hands and stabilize independently    Standing Unsupported  Able to stand safely 2 minutes    Sitting with Back Unsupported but Feet Supported on Floor or Stool  Able to sit safely and securely 2 minutes    Stand to Sit  Sits safely with minimal use of hands    Transfers  Able to transfer safely, minor use of hands    Standing Unsupported with Eyes Closed  Able to stand 10 seconds safely    Standing Ubsupported with Feet Together  Able to place feet together independently and stand 1 minute safely    From Standing,  Reach Forward with Outstretched Arm  Can reach confidently >25 cm (10")    From Standing Position, Pick up Object from [place] to pick up shoe safely and easily    From Standing Position, Turn to Look Behind Over each Shoulder  Looks behind from both sides and weight shifts well    Turn 360 Degrees  Able to turn 360 degrees safely in 4 seconds or less    Standing Unsupported, Alternately Place Feet on Step/Stool  Able to stand independently and safely and complete 8 steps in 20 seconds    Standing Unsupported, One Foot in Front  Able to plae foot ahead of the other independently and hold 30 seconds    Standing on One Leg  Tries to lift leg/unable to hold 3 seconds but remains standing independently    Total Score  52      Knee/Hip Exercises: Supine   Quad Sets  1 set;10 reps    Quad Sets Limitations  cued ,  HEP unclear to patient    Heel Slides  -- needs cues for reps and duration,  suggested heat prior.     Straight Leg Raises  Strengthening cues needed for technique reps.       Modalities   Modalities  -- moist heat during manual       Manual  Therapy   Manual therapy comments  patellar creeping for medial lateral patellar glides.  LT stiffer than right,.  hamstring mnual with retrograde,  mobilitl of patella improved,  tissue softened,  head helpful .               PT Education - 12/18/16 1811    Education provided  Yes    Education Details  HEP,  How to stretch.    Person(s) Educated  Patient    Methods  Explanation;Tactile cues;Verbal cues    Comprehension  Returned demonstration          PT Long Term Goals - 12/01/16 1206      PT LONG TERM GOAL #1   Title  Patient will be I with HEP for LE strength and knee AROM.     Time  6    Period  Weeks    Status  New    Target Date  01/12/17      PT LONG TERM GOAL #2   Title  Pt will be able to improve her FOTO score to less than 60% impaired to show improved functional mobility.     Time  6    Period  Weeks    Status  New    Target Date  01/12/17      PT LONG TERM GOAL #3   Title  Pt will be able to do a portion of dusting, cooking, light housework in standing, up to 15-20 min at a time without sititng down.     Time  6    Status  New    Target Date  01/12/17      PT LONG TERM GOAL #4   Title  Pt will be able to walk at gait speed with cane and improved confidence in Rt  knee.  (>2.62 feet/sec)     Baseline  slower than normal, NT on eval     Time  6    Period  Weeks    Status  New    Target Date  01/12/17      PT LONG TERM GOAL #5   Title  Pt will increase hip strength to >4/5 for better gait stability    Baseline  appears 3/5 or less     Time  6    Period  Weeks    Status  New    Target Date  01/12/17            Plan - 12/18/16 1812    Clinical Impression Statement  BERG  52/56.  Patient is detail oriented and needs clear instruction with exercise,  especially her HEP.  Extra time required.   She had increased pain after last session ,  however this may also be due to shopping directly post session.     PT Next Visit Plan  measure gait  speed,   PT to discuss Merrilee Jansky results with patient. balance screen? check hip MMT abd and ext     PT Home Exercise Plan  HS stretch, SLR,  heel slides, quad sets,  LAQ       Patient will benefit from skilled therapeutic intervention in order to improve the following deficits and impairments:     Visit Diagnosis: Chronic pain of left knee  Chronic pain of right knee  Stiffness of left knee, not elsewhere classified  Stiffness of right knee, not elsewhere classified  Difficulty in walking, not elsewhere classified  Muscle weakness (generalized)     Problem List Patient Active Problem List   Diagnosis Date Noted  . Allergic contact dermatitis due to metals 12/16/2016  . Abnormal gait 04/10/2016  . Dyslipidemia 08/12/2015  . Dyspnea on exertion 08/12/2015  . Palpitations 08/12/2015  . Pes anserine bursitis 08/10/2015  . Rupture of left posterior tibialis tendon 03/25/2015  . OSA (obstructive sleep apnea) 03/05/2015  . Navicular fracture of ankle 01/21/2015  . Sinus infection 12/25/2014  . Uric acid arthropathy 03/23/2014  . High plasma oxalate 02/17/2014  . Nonallopathic lesion of cervical region 12/19/2012  . Fibroids 05/28/2012  . Family history of ovarian cancer 05/28/2012  . Strain of quadriceps muscle 04/17/2012  . Nonallopathic lesion of costovertebral region 01/23/2012  . Left shoulder pain 12/20/2011  . Rotator cuff disorder 09/26/2011  . Bilateral foot pain 09/20/2011  . Arthralgia of multiple joints 07/26/2011  . Arthritis of knee, degenerative 07/26/2011  . Arthritis of hand, degenerative 07/26/2011  . Low back pain 07/26/2011  . Vitamin B 12 deficiency 03/05/2011  . TMJ (temporomandibular joint syndrome) 03/05/2011  . Pes planus 03/01/2011  . Right knee pain 03/01/2011  . Greater trochanteric bursitis of left hip 03/01/2011  . Nonallopathic lesion of lumbosacral region 01/27/2011  . Obesity 12/29/2010  . Fatigue 12/29/2010  . Allergic rhinitis, cause  unspecified   . Chronic pain   . GERD (gastroesophageal reflux disease)   . Sleep apnea     Kerry [name]  PTA 12/18/2016, 6:16 PM  [facility] [address] [place], [place], [address] Phone: [phone]   Fax:  [phone]  Name: Kerry [name] MRN: [id number] Date of Birth: 1950-08-20

## 2016-12-19 ENCOUNTER — Ambulatory Visit: Payer: Medicare Other | Admitting: Cardiology

## 2016-12-20 ENCOUNTER — Ambulatory Visit: Payer: Medicare Other | Admitting: Allergy and Immunology

## 2016-12-20 DIAGNOSIS — L230 Allergic contact dermatitis due to metals: Secondary | ICD-10-CM

## 2016-12-21 ENCOUNTER — Encounter: Payer: Self-pay | Admitting: Allergy and Immunology

## 2016-12-21 NOTE — Progress Notes (Signed)
Kerry [name] returns to this clinic for a 7-day repeat of her middle patch testing.  Evaluation today did not identify any reactions on her skin.  There did not appear to be any hyperpigmentation or scale or scarring at her previous 48-hour reactivity documented with cobalt, molybenum and Tantal.

## 2016-12-22 ENCOUNTER — Encounter: Payer: Self-pay | Admitting: Physical Therapy

## 2016-12-22 ENCOUNTER — Ambulatory Visit: Payer: Medicare Other | Admitting: Physical Therapy

## 2016-12-22 DIAGNOSIS — M6281 Muscle weakness (generalized): Secondary | ICD-10-CM

## 2016-12-22 DIAGNOSIS — M25661 Stiffness of right knee, not elsewhere classified: Secondary | ICD-10-CM

## 2016-12-22 DIAGNOSIS — M25562 Pain in left knee: Secondary | ICD-10-CM | POA: Diagnosis not present

## 2016-12-22 DIAGNOSIS — R262 Difficulty in walking, not elsewhere classified: Secondary | ICD-10-CM

## 2016-12-22 DIAGNOSIS — M25662 Stiffness of left knee, not elsewhere classified: Secondary | ICD-10-CM

## 2016-12-22 DIAGNOSIS — M25561 Pain in right knee: Secondary | ICD-10-CM

## 2016-12-22 DIAGNOSIS — G8929 Other chronic pain: Secondary | ICD-10-CM | POA: Diagnosis not present

## 2016-12-22 NOTE — Therapy (Signed)
[name], [place], [address] Phone: [phone]   Fax:  [phone]  Physical Therapy Treatment  Patient Details  Name: Kerry [name] MRN: [id number] Date of Birth: Dec 26, 1950 Referring Provider: Dr. Hulan Saas   Encounter Date: 12/22/2016  PT End of Session - 12/22/16 1130    Visit Number  4    Number of Visits  12    Date for PT Re-Evaluation  01/12/17    PT Start Time  1105    PT Stop Time  1155    PT Time Calculation (min)  50 min    Activity Tolerance  Patient tolerated treatment well    Behavior During Therapy  [facility] for tasks assessed/performed       Past Medical History:  Diagnosis Date  . Abnormal uterine bleeding   . Allergic rhinitis, cause unspecified   . Arthritis of knee, right   . Bursitis of left shoulder   . 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 Chronic pain   . Dyspareunia   . Fibroid   . Foot fracture, left 01/2015   and torn tendons  . GERD (gastroesophageal reflux disease)   . HTN (hypertension)   . Kidney stones   . Sleep apnea   . Urinary incontinence     Past Surgical History:  Procedure Laterality Date  .  dilatation and curettage  2000  . CARPAL TUNNEL RELEASE  1998  . CATARACT EXTRACTION Bilateral   . dental implants  2010  . ESOPHAGEAL DILATION  2010  . HAMMER TOE SURGERY     rt  . PELVIC LAPAROSCOPY  1990  . ROTATOR CUFF REPAIR Right 09/2011   Dr. Tamera Punt  . ROTATOR CUFF REPAIR Left 11/11/13   Dr. Tamera Punt   . SEPTOPLASTY    . UMBILICAL HERNIA REPAIR  2010    There were no vitals filed for this visit.  Subjective Assessment - 12/22/16 1107    Subjective  Still hurts alot after therapy.  Allergic to several metals (Cobalt, Molybdenum and something else). Allergies can cause joint pain. Knows she may not ever be pain free.      Currently in Pain?  Yes    Pain Score  3  rest    Pain Location  Knee    Pain Orientation  Left;Right    Pain  Descriptors / Indicators  Shooting;Sharp    Pain Type  Chronic pain    Aggravating Factors   after therapy          [facility] PT Assessment - 12/22/16 0001      AROM   Right Knee Extension  8    Right Knee Flexion  104    Left Knee Flexion  114      Palpation   Patella mobility  moving better today           [facility] Adult PT Treatment/Exercise - 12/22/16 0001      Self-Care   Self-Care  Other Self-Care Comments    Other Self-Care Comments   HEP, answered quesitions, increased time  Berg interpretation, needs cane in community       Knee/Hip Exercises: Stretches   Active Hamstring Stretch  Both;3 reps    Knee: Self-Stretch to increase Flexion  10 seconds;Other (comment) x 5 x 10 sec       Knee/Hip Exercises: Aerobic   Nustep  L1 5 min LE only less pain than initial       Knee/Hip  Exercises: Supine   Straight Leg Raises  Strengthening;Both;2 sets;10 reps      Knee/Hip Exercises: Sidelying   Hip ABduction  Strengthening;Both;1 set      Modalities   Modalities  Cryotherapy      Cryotherapy   Number Minutes Cryotherapy  10 Minutes    Cryotherapy Location  Knee    Type of Cryotherapy  Ice pack                  PT Long Term Goals - 12/22/16 1131      PT LONG TERM GOAL #1   Title  Patient will be I with HEP for LE strength and knee AROM.     Baseline  met for ROM     Status  On-going      PT LONG TERM GOAL #2   Title  Pt will be able to improve her FOTO score to less than 60% impaired to show improved functional mobility.     Status  Unable to assess      PT LONG TERM GOAL #3   Title  Pt will be able to do a portion of dusting, cooking, light housework in standing, up to 15-20 min at a time without sititng down.     Status  On-going      PT LONG TERM GOAL #4   Title  Pt will be able to walk at gait speed with cane and improved confidence in Rt  knee.  (>2.62 feet/sec)     Status  On-going      PT LONG TERM GOAL #5   Title  Pt will increase hip  strength to >4/5 for better gait stability    Status  On-going            Plan - 12/22/16 1135    Clinical Impression Statement  Patient doing well, clarified HEP for her again.  More flexibility per patient and with objective measurements.      PT Next Visit Plan  measure gait speed, cont with gentle strength knees and hips, flexibility    PT Home Exercise Plan  HS stretch, SLR,  heel slides, quad sets,  LAQ, hip abd     Consulted and Agree with Plan of Care  Patient       Patient will benefit from skilled therapeutic intervention in order to improve the following deficits and impairments:     Visit Diagnosis: Chronic pain of left knee  Chronic pain of right knee  Stiffness of left knee, not elsewhere classified  Stiffness of right knee, not elsewhere classified  Difficulty in walking, not elsewhere classified  Muscle weakness (generalized)     Problem List Patient Active Problem List   Diagnosis Date Noted  . Allergic contact dermatitis due to metals 12/16/2016  . Abnormal gait 04/10/2016  . Dyslipidemia 08/12/2015  . Dyspnea on exertion 08/12/2015  . Palpitations 08/12/2015  . Pes anserine bursitis 08/10/2015  . Rupture of left posterior tibialis tendon 03/25/2015  . OSA (obstructive sleep apnea) 03/05/2015  . Navicular fracture of ankle 01/21/2015  . Sinus infection 12/25/2014  . Uric acid arthropathy 03/23/2014  . High plasma oxalate 02/17/2014  . Nonallopathic lesion of cervical region 12/19/2012  . Fibroids 05/28/2012  . Family history of ovarian cancer 05/28/2012  . Strain of quadriceps muscle 04/17/2012  . Nonallopathic lesion of costovertebral region 01/23/2012  . Left shoulder pain 12/20/2011  . Rotator cuff disorder 09/26/2011  . Bilateral foot pain 09/20/2011  . Arthralgia of  multiple joints 07/26/2011  . Arthritis of knee, degenerative 07/26/2011  . Arthritis of hand, degenerative 07/26/2011  . Low back pain 07/26/2011  . Vitamin B 12  deficiency 03/05/2011  . TMJ (temporomandibular joint syndrome) 03/05/2011  . Pes planus 03/01/2011  . Right knee pain 03/01/2011  . Greater trochanteric bursitis of left hip 03/01/2011  . Nonallopathic lesion of lumbosacral region 01/27/2011  . Obesity 12/29/2010  . Fatigue 12/29/2010  . Allergic rhinitis, cause unspecified   . Chronic pain   . GERD (gastroesophageal reflux disease)   . Sleep apnea     PAA,JENNIFER 12/22/2016, 11:53 AM  [place], [place], [address] Phone: [phone]   Fax:  [phone]  Name: Kerry [name] MRN: [id number] Date of Birth: 1950/12/21  Raeford Razor, PT 12/22/16 11:53 AM Phone: [phone] Fax: [phone]

## 2016-12-23 ENCOUNTER — Encounter: Payer: Self-pay | Admitting: Allergy and Immunology

## 2016-12-25 ENCOUNTER — Encounter: Payer: Self-pay | Admitting: Family Medicine

## 2016-12-26 ENCOUNTER — Ambulatory Visit: Payer: Medicare Other | Admitting: Cardiology

## 2016-12-26 ENCOUNTER — Ambulatory Visit: Payer: Medicare Other | Admitting: Physical Therapy

## 2016-12-26 ENCOUNTER — Encounter: Payer: Self-pay | Admitting: Physical Therapy

## 2016-12-26 ENCOUNTER — Telehealth: Payer: Self-pay

## 2016-12-26 DIAGNOSIS — G8929 Other chronic pain: Secondary | ICD-10-CM

## 2016-12-26 DIAGNOSIS — M25561 Pain in right knee: Secondary | ICD-10-CM | POA: Diagnosis not present

## 2016-12-26 DIAGNOSIS — M25662 Stiffness of left knee, not elsewhere classified: Secondary | ICD-10-CM

## 2016-12-26 DIAGNOSIS — M25562 Pain in left knee: Principal | ICD-10-CM

## 2016-12-26 DIAGNOSIS — R262 Difficulty in walking, not elsewhere classified: Secondary | ICD-10-CM | POA: Diagnosis not present

## 2016-12-26 DIAGNOSIS — M25661 Stiffness of right knee, not elsewhere classified: Secondary | ICD-10-CM

## 2016-12-26 NOTE — Therapy (Signed)
[place], [place], [address] Phone: [phone]   Fax:  [phone]  Physical Therapy Treatment  Patient Details  Name: Kerry [name] MRN: [id number] Date of Birth: 21-Feb-1950 Referring Provider: Dr. Hulan Saas   Encounter Date: 12/26/2016  PT End of Session - 12/26/16 1505    Visit Number  5    Number of Visits  12    Date for PT Re-Evaluation  01/12/17    PT Start Time  2836    PT Stop Time  1516    PT Time Calculation (min)  61 min    Behavior During Therapy  [facility] for tasks assessed/performed       Past Medical History:  Diagnosis Date  . Abnormal uterine bleeding   . Allergic rhinitis, cause unspecified   . Arthritis of knee, right   . Bursitis of left shoulder   . 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 Chronic pain   . Dyspareunia   . Fibroid   . Foot fracture, left 01/2015   and torn tendons  . GERD (gastroesophageal reflux disease)   . HTN (hypertension)   . Kidney stones   . Sleep apnea   . Urinary incontinence     Past Surgical History:  Procedure Laterality Date  .  dilatation and curettage  2000  . CARPAL TUNNEL RELEASE  1998  . CATARACT EXTRACTION Bilateral   . dental implants  2010  . ESOPHAGEAL DILATION  2010  . HAMMER TOE SURGERY     rt  . PELVIC LAPAROSCOPY  1990  . ROTATOR CUFF REPAIR Right 09/2011   Dr. Tamera Punt  . ROTATOR CUFF REPAIR Left 11/11/13   Dr. Tamera Punt   . SEPTOPLASTY    . UMBILICAL HERNIA REPAIR  2010    There were no vitals filed for this visit.  Subjective Assessment - 12/26/16 1412    Subjective  SORE FROM THE WEATHER..    Knees are more sore,  Motion is better.      Currently in Pain?  Yes    Pain Score  5     Pain Location  Knee    Pain Descriptors / Indicators  Shooting;Sore;Sharp;Aching a fullness from injections    Aggravating Factors   Weather.  after therapy,     Pain Relieving Factors  rest,  ice,  heat    Effect of Pain on Daily  Activities  avoids traveling.  Avoids ADL.                         [facility] Adult PT Treatment/Exercise - 12/26/16 0001      Knee/Hip Exercises: Stretches   Active Hamstring Stretch  Both;3 reps;30 seconds      Knee/Hip Exercises: Aerobic   Nustep  L1 7 minutes      Knee/Hip Exercises: Supine   Heel Slides  AAROM 10 X with strap,  Each    Straight Leg Raises  Strengthening;1 set 12 each    Other Supine Knee/Hip Exercises  legs on ball 10 X hip flexion      Knee/Hip Exercises: Sidelying   Hip ABduction  Strengthening;10 reps cues      Moist Heat Therapy   Number Minutes Moist Heat  15 Minutes    Moist Heat Location  Knee both             PT Education - 12/26/16 1504    Education provided  Yes  Education Details  cues for current HEP    Person(s) Educated  Patient    Methods  Explanation;Verbal cues          PT Long Term Goals - 12/26/16 1438      PT LONG TERM GOAL #1   Baseline  Cus for strenghtening    Time  6    Period  Weeks    Status  On-going      PT LONG TERM GOAL #2   Title  Pt will be able to improve her FOTO score to less than 60% impaired to show improved functional mobility.     Time  6    Status  Unable to assess      PT LONG TERM GOAL #3   Title  Pt will be able to do a portion of dusting, cooking, light housework in standing, up to 15-20 min at a time without sititng down.     Baseline  She could not say.  Does not try to stand longer    Time  6    Period  Weeks    Status  On-going      PT LONG TERM GOAL #4   Time  6    Period  Weeks    Status  Unable to assess      PT LONG TERM GOAL #5   Title  Pt will increase hip strength to >4/5 for better gait stability    Time  6    Period  Weeks    Status  Unable to assess            Plan - 12/26/16 1505    Clinical Impression Statement  Pain 2/10 at end of session.  Knee flexion left 110 prior to exercise.  Patient requires min cues for current HEP.  Patient avoids  standing and walking in the kitchen.  Noted bed mobility improved since this PTA saw patient.     PT Next Visit Plan  measure gait speed, cont with gentle strength knees and hips, flexibility    PT Home Exercise Plan  HS stretch, SLR,  heel slides, quad sets,  LAQ, hip abd     Consulted and Agree with Plan of Care  Patient       Patient will benefit from skilled therapeutic intervention in order to improve the following deficits and impairments:     Visit Diagnosis: Chronic pain of left knee  Chronic pain of right knee  Stiffness of left knee, not elsewhere classified  Stiffness of right knee, not elsewhere classified  Difficulty in walking, not elsewhere classified     Problem List Patient Active Problem List   Diagnosis Date Noted  . Allergic contact dermatitis due to metals 12/16/2016  . Abnormal gait 04/10/2016  . Dyslipidemia 08/12/2015  . Dyspnea on exertion 08/12/2015  . Palpitations 08/12/2015  . Pes anserine bursitis 08/10/2015  . Rupture of left posterior tibialis tendon 03/25/2015  . OSA (obstructive sleep apnea) 03/05/2015  . Navicular fracture of ankle 01/21/2015  . Sinus infection 12/25/2014  . Uric acid arthropathy 03/23/2014  . High plasma oxalate 02/17/2014  . Nonallopathic lesion of cervical region 12/19/2012  . Fibroids 05/28/2012  . Family history of ovarian cancer 05/28/2012  . Strain of quadriceps muscle 04/17/2012  . Nonallopathic lesion of costovertebral region 01/23/2012  . Left shoulder pain 12/20/2011  . Rotator cuff disorder 09/26/2011  . Bilateral foot pain 09/20/2011  . Arthralgia of multiple joints 07/26/2011  . Arthritis of  knee, degenerative 07/26/2011  . Arthritis of hand, degenerative 07/26/2011  . Low back pain 07/26/2011  . Vitamin B 12 deficiency 03/05/2011  . TMJ (temporomandibular joint syndrome) 03/05/2011  . Pes planus 03/01/2011  . Right knee pain 03/01/2011  . Greater trochanteric bursitis of left hip 03/01/2011  .  Nonallopathic lesion of lumbosacral region 01/27/2011  . Obesity 12/29/2010  . Fatigue 12/29/2010  . Allergic rhinitis, cause unspecified   . Chronic pain   . GERD (gastroesophageal reflux disease)   . Sleep apnea     HARRIS,KAREN PTA 12/26/2016, 3:55 PM  [facility] [address] [place], [place], [address] Phone: [phone]   Fax:  [phone]  Name: Kerry [name] MRN: [id number] Date of Birth: [birth year]

## 2016-12-26 NOTE — Telephone Encounter (Signed)
-----   Message from [place], Generic sent at 12/23/2016 6:05 AM EST -----    The test results from my metals patch testing (Nov 28, Nov 30, and Dec 5) are not in my [employer]. I have 2 appts coming up next week during which I want to discuss the metal allergies.    Please advise when the results will be added to the Test Results section of MyChart.    Thanks.   Lm for pt to call us back. Dr Raliegh Ip did document results in mychart

## 2016-12-27 ENCOUNTER — Ambulatory Visit (INDEPENDENT_AMBULATORY_CARE_PROVIDER_SITE_OTHER): Payer: Medicare Other | Admitting: Cardiology

## 2016-12-27 ENCOUNTER — Encounter: Payer: Self-pay | Admitting: Cardiology

## 2016-12-27 VITALS — BP 158/88 | HR 106 | Ht 65.5 in | Wt 239.0 lb

## 2016-12-27 DIAGNOSIS — G4733 Obstructive sleep apnea (adult) (pediatric): Secondary | ICD-10-CM | POA: Diagnosis not present

## 2016-12-27 DIAGNOSIS — E785 Hyperlipidemia, unspecified: Secondary | ICD-10-CM | POA: Diagnosis not present

## 2016-12-27 DIAGNOSIS — M7989 Other specified soft tissue disorders: Secondary | ICD-10-CM | POA: Diagnosis not present

## 2016-12-27 NOTE — Patient Instructions (Signed)
Medication Instructions:  Your physician recommends that you continue on your current medications as directed. Please refer to the Current Medication list given to you today.  Labwork: Your physician recommends that you have lab work today: BMP and BNP  Testing/Procedures: Your physician has requested that you have an echocardiogram. Echocardiography is a painless test that uses sound waves to create images of your heart. It provides your doctor with information about the size and shape of your heart and how well your heart's chambers and valves are working. This procedure takes approximately one hour. There are no restrictions for this procedure.  Follow-Up: Your physician recommends that you schedule a follow-up appointment in: 3 weeks   Any Other Special Instructions Will Be Listed Below (If Applicable).     If you need a refill on your cardiac medications before your next appointment, please call your pharmacy.

## 2016-12-27 NOTE — Progress Notes (Signed)
Cardiology Office Note:    Date:  12/27/2016   ID:  Kerry [name], DOB November 07, 1950, MRN [id number]  PCP:  Street, [name], MD  Cardiologist:  Jenne Campus, MD    Referring MD: Street, [name], *   Chief Complaint  Patient presents with  . Follow-up  I have a lot of problems  History of Present Illness:    Kerry [name] is a 66 y.o. female with essential hypertension, sleep apnea, palpitations.  She said for last few months she has been feeling horrible.  Apparently she did some investigation she find out that she is allergic to multiple metals.  She blamed her symptoms to some dental implant that she had those dental implant containing those metals.  However within the last few weeks to months she started experiencing more swelling of lower extremities and exertional shortness of breath.  She does have some proximal nocturnal dyspnea.  Denies have any chest pain tightness squeezing pressure burning chest  Past Medical History:  Diagnosis Date  . Abnormal uterine bleeding   . Allergic rhinitis, cause unspecified   . Arthritis of knee, right   . Bursitis of left shoulder   . 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 Chronic pain   . Dyspareunia   . Fibroid   . Foot fracture, left 01/2015   and torn tendons  . GERD (gastroesophageal reflux disease)   . HTN (hypertension)   . Kidney stones   . Sleep apnea   . Urinary incontinence     Past Surgical History:  Procedure Laterality Date  .  dilatation and curettage  2000  . CARPAL TUNNEL RELEASE  1998  . CATARACT EXTRACTION Bilateral   . dental implants  2010  . ESOPHAGEAL DILATION  2010  . HAMMER TOE SURGERY     rt  . PELVIC LAPAROSCOPY  1990  . ROTATOR CUFF REPAIR Right 09/2011   Dr. Tamera Punt  . ROTATOR CUFF REPAIR Left 11/11/13   Dr. Tamera Punt   . SEPTOPLASTY    . UMBILICAL HERNIA REPAIR  2010    Current Medications: Current Meds  Medication Sig  . Cholecalciferol (VITAMIN D3) 5000 units CAPS Take  5,000 Units by mouth every other day.   . Chromium Picolinate 500 MCG CAPS Take 1 capsule by mouth 2 (two) times daily.  . Cyanocobalamin (B-12) 1000 MCG/ML KIT Inject 1 Dose as directed every 14 (fourteen) days.  . Multiple Vitamin (MULTIVITAMIN) tablet Take 1 tablet by mouth daily.   . Needles & Syringes MISC 1 Can by Does not apply route once a week. Dispense meals for B12 IM injections please thank you  . Probiotic Product (PROBIOTIC ADVANCED PO) Take by mouth. Patient states taking 5 billion, one capsule daily.  . vitamin C (ASCORBIC ACID) 500 MG tablet Take 500 mg by mouth 2 (two) times daily.      Allergies:   Cefixime; Celebrex [celecoxib]; Cobalt; Gabapentin; Levofloxacin; Molybdenum; Other; Penicillins; Robaxin [methocarbamol]; Tomato; Adhesive [tape]; Tetracycline hcl; and Tetracyclines & related   Social History   Socioeconomic History  . Marital status: Married    Spouse name: None  . Number of children: None  . Years of education: None  . Highest education level: None  Social Needs  . Financial resource strain: None  . Food insecurity - worry: None  . Food insecurity - inability: None  . Transportation needs - medical: None  . Transportation needs - non-medical: None  Occupational History  . Occupation: Retired  Tobacco Use  . Smoking status: Never Smoker  . Smokeless tobacco: Never Used  Substance and Sexual Activity  . Alcohol use: No    Alcohol/week: 0.0 oz  . Drug use: No  . Sexual activity: No    Partners: Male    Birth control/protection: Post-menopausal  Other Topics Concern  . None  Social History Narrative  . None     Family History: The patient's family history includes Arthritis in her sister; Crohn's disease in her brother; Diabetes in her mother; Glaucoma in her brother; Heart attack in her father; Kidney failure in her brother; Ovarian cancer (age of onset: 21) in her sister; Rheumatologic disease in her mother and sister; Ulcerative colitis in  her brother. ROS:   Please see the history of present illness.    All 14 point review of systems negative except as described per history of present illness  EKGs/Labs/Other Studies Reviewed:      Recent Labs: 09/01/2016: Hemoglobin 15.0; Platelets 248.0  Recent Lipid Panel    Component Value Date/Time   CHOL 257 (H) 02/09/2011 1027   TRIG 136 02/09/2011 1027   HDL 46 02/09/2011 1027   CHOLHDL 5.6 02/09/2011 1027   VLDL 27 02/09/2011 1027   LDLCALC 184 (H) 02/09/2011 1027    Physical Exam:    VS:  BP (!) 158/88 (BP Location: Right Arm, Patient Position: Sitting, Cuff Size: Large)   Pulse (!) 106   Ht 5' 5.5" (1.664 m)   Wt 239 lb (108.4 kg)   LMP 01/16/2002   SpO2 97%   BMI 39.17 kg/m     Wt Readings from Last 3 Encounters:  12/27/16 239 lb (108.4 kg)  12/13/16 238 lb (108 kg)  12/04/16 240 lb (108.9 kg)     GEN:  Well nourished, well developed in no acute distress HEENT: Normal NECK: No JVD; No carotid bruits LYMPHATICS: No lymphadenopathy CARDIAC: RRR, no murmurs, no rubs, no gallops RESPIRATORY:  Clear to auscultation without rales, wheezing or rhonchi  ABDOMEN: Soft, non-tender, non-distended MUSCULOSKELETAL:  No edema; No deformity  SKIN: Warm and dry LOWER EXTREMITIES: Plus swelling NEUROLOGIC:  Alert and oriented x 3 PSYCHIATRIC:  Normal affect   ASSESSMENT:    1. Dyslipidemia   2. OSA (obstructive sleep apnea)    PLAN:    In order of problems listed above:  1. Dyslipidemia: We will call primary care physician to get fasting lipid profile 2. Obstructive sleep apnea use CPAP mask followed by internal medicine team. 3. Swelling of lower extremities I will ask you to have Chem-7 as well as proBNP done today.  Echocardiogram will be scheduled to assess her left ventricular ejection fraction pulmonary artery pressure.  I see her back in my office in about 3 weeks or sooner she has a problem   Medication Adjustments/Labs and Tests Ordered: Current  medicines are reviewed at length with the patient today.  Concerns regarding medicines are outlined above.  No orders of the defined types were placed in this encounter.  Medication changes: No orders of the defined types were placed in this encounter.   Signed, Park Liter, MD, [facility] 12/27/2016 3:30 PM    [place]

## 2016-12-27 NOTE — Addendum Note (Signed)
Addended by: Aleatha Borer on: 12/27/2016 03:45 PM   Modules accepted: Orders

## 2016-12-28 ENCOUNTER — Ambulatory Visit (HOSPITAL_BASED_OUTPATIENT_CLINIC_OR_DEPARTMENT_OTHER)
Admission: RE | Admit: 2016-12-28 | Discharge: 2016-12-28 | Disposition: A | Discharge status: Home / Self Care | Payer: Medicare Other | Source: Ambulatory Visit | Attending: Family Medicine | Admitting: Family Medicine

## 2016-12-28 ENCOUNTER — Encounter: Payer: Medicare Other | Admitting: Physical Therapy

## 2016-12-28 DIAGNOSIS — G4733 Obstructive sleep apnea (adult) (pediatric): Secondary | ICD-10-CM | POA: Diagnosis not present

## 2016-12-28 DIAGNOSIS — I10 Essential (primary) hypertension: Secondary | ICD-10-CM | POA: Diagnosis not present

## 2016-12-28 DIAGNOSIS — E785 Hyperlipidemia, unspecified: Secondary | ICD-10-CM | POA: Insufficient documentation

## 2016-12-28 LAB — BASIC METABOLIC PANEL
BUN / CREAT RATIO: 23 (ref 12–28)
BUN: 17 mg/dL (ref 8–27)
CALCIUM: 9.5 mg/dL (ref 8.7–10.3)
CHLORIDE: 104 mmol/L (ref 96–106)
CO2: 25 mmol/L (ref 20–29)
Creatinine, Ser: 0.74 mg/dL (ref 0.57–1.00)
GFR, EST AFRICAN AMERICAN: 96 mL/min/{1.73_m2} (ref >59)
GFR, EST NON AFRICAN AMERICAN: 84 mL/min/{1.73_m2} (ref >59)
Glucose: 106 mg/dL — ABNORMAL HIGH (ref 65–99)
Potassium: 4.4 mmol/L (ref 3.5–5.2)
Sodium: 145 mmol/L — ABNORMAL HIGH (ref 134–144)

## 2016-12-28 LAB — PRO B NATRIURETIC PEPTIDE: NT-PRO BNP: 68 pg/mL (ref 0–301)

## 2016-12-28 NOTE — Progress Notes (Signed)
Echocardiogram 2D Echocardiogram has been performed.  Joelene Millin 12/28/2016, 11:10 AM

## 2017-01-01 ENCOUNTER — Ambulatory Visit: Payer: Medicare Other | Admitting: Physical Therapy

## 2017-01-01 ENCOUNTER — Encounter: Payer: Self-pay | Admitting: Physical Therapy

## 2017-01-01 DIAGNOSIS — M25561 Pain in right knee: Secondary | ICD-10-CM

## 2017-01-01 DIAGNOSIS — M6281 Muscle weakness (generalized): Secondary | ICD-10-CM

## 2017-01-01 DIAGNOSIS — G8929 Other chronic pain: Secondary | ICD-10-CM

## 2017-01-01 DIAGNOSIS — R262 Difficulty in walking, not elsewhere classified: Secondary | ICD-10-CM

## 2017-01-01 DIAGNOSIS — M25661 Stiffness of right knee, not elsewhere classified: Secondary | ICD-10-CM | POA: Diagnosis not present

## 2017-01-01 DIAGNOSIS — M25562 Pain in left knee: Secondary | ICD-10-CM | POA: Diagnosis not present

## 2017-01-01 DIAGNOSIS — M25662 Stiffness of left knee, not elsewhere classified: Secondary | ICD-10-CM | POA: Diagnosis not present

## 2017-01-01 NOTE — Therapy (Signed)
[place], [place], [address] Phone: [phone]   Fax:  [phone]  Physical Therapy Treatment  Patient Details  Name: Kerry [name] MRN: [id number] Date of Birth: 02/18/1950 Referring Provider: Dr. Hulan Saas   Encounter Date: 01/01/2017  PT End of Session - 01/01/17 1627    Visit Number  6    Number of Visits  12    Date for PT Re-Evaluation  01/12/17    PT Start Time  0350    PT Stop Time  1515    PT Time Calculation (min)  57 min    Activity Tolerance  Patient tolerated treatment well    Behavior During Therapy  [facility] for tasks assessed/performed       Past Medical History:  Diagnosis Date  . Abnormal uterine bleeding   . Allergic rhinitis, cause unspecified   . Arthritis of knee, right   . Bursitis of left shoulder   . 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 Chronic pain   . Dyspareunia   . Fibroid   . Foot fracture, left 01/2015   and torn tendons  . GERD (gastroesophageal reflux disease)   . HTN (hypertension)   . Kidney stones   . Sleep apnea   . Urinary incontinence     Past Surgical History:  Procedure Laterality Date  .  dilatation and curettage  2000  . CARPAL TUNNEL RELEASE  1998  . CATARACT EXTRACTION Bilateral   . dental implants  2010  . ESOPHAGEAL DILATION  2010  . HAMMER TOE SURGERY     rt  . PELVIC LAPAROSCOPY  1990  . ROTATOR CUFF REPAIR Right 09/2011   Dr. Tamera Punt  . ROTATOR CUFF REPAIR Left 11/11/13   Dr. Tamera Punt   . SEPTOPLASTY    . UMBILICAL HERNIA REPAIR  2010    There were no vitals filed for this visit.  Subjective Assessment - 01/01/17 1425    Subjective  [place].  The heat felt good.  I have soreness the day after PT.   I exercise as you told me.      Pain Score  5     Pain Location  Knee    Pain Orientation  Right;Left    Pain Descriptors / Indicators  Aching;Tightness;Sore    Pain Type  Chronic pain    Pain Onset   More than a month ago    Pain Frequency  Intermittent    Aggravating Factors   riding in the car boiuncing my knees for hours.      Pain Relieving Factors  rest heat ice.    Effect of Pain on Daily Activities  Avoids some ADLs    Multiple Pain Sites  Yes         [facility] PT Assessment - 01/01/17 0001      AROM   Right Knee Flexion  97 measured after long car ride.    Left Knee Flexion  112      Strength   Right Hip Flexion  4/5 seated    Left Hip Flexion  4/5 seated    Right Knee Flexion  5/5    Right Knee Extension  4/5    Left Knee Flexion  5/5    Left Knee Extension  4+/5                  [facility] Adult PT Treatment/Exercise - 01/01/17 0001  Self-Care   Self-Care  Other Self-Care Comments    Other Self-Care Comments   Measured ball and  bolster  size.  she is interested in purchase for home.  she will look on line.       Knee/Hip Exercises: Aerobic   Nustep  L1 7 minutes      Knee/Hip Exercises: Standing   Hip Flexion  1 set;10 reps;Both    Hip Flexion Limitations  needed to hold counter.      Hip Abduction  1 set;10 reps    Abduction Limitations  needed to hold the counter.      Knee/Hip Exercises: Supine   Heel Slides  5 reps;AROM AArom 10 x (104  rIGHT)  STRAP USED.    Straight Leg Raises  Strengthening;10 reps    Patellar Mobs  Yes mobility improving.    Other Supine Knee/Hip Exercises  legs on ball 10 X hip flexion      Moist Heat Therapy   Number Minutes Moist Heat  15 Minutes    Moist Heat Location  Knee both      Manual Therapy   Manual therapy comments  soft tissue retrograde thigh brief,  tissue softened             PT Education - 01/01/17 1626    Education provided  Yes    Education Details  size of exercise ball and bolster used in the clinic.     Person(s) Educated  Patient    Methods  Explanation;Handout    Comprehension  Verbalized understanding          PT Long Term Goals - 01/01/17 1634      PT LONG TERM GOAL #1    Title  Patient will be I with HEP for LE strength and knee AROM.     Baseline  modified independent  with those issued so far    Time  6    Period  Weeks    Status  On-going      PT LONG TERM GOAL #2   Time  6    Period  Weeks    Status  Unable to assess      PT LONG TERM GOAL #3   Title  Pt will be able to do a portion of dusting, cooking, light housework in standing, up to 15-20 min at a time without sititng down.     Time  6    Period  Weeks    Status  On-going      PT LONG TERM GOAL #4   Title  Pt will be able to walk at gait speed with cane and improved confidence in Rt  knee.  (>2.62 feet/sec)     Baseline  she can walk 30 feet in 14 seconds with cane.    Time  6    Period  Weeks    Status  On-going      PT LONG TERM GOAL #5   Title  Pt will increase hip strength to >4/5 for better gait stability    Baseline  4/5 for muscles tested    Time  6    Period  Weeks    Status  Partially Met            Plan - 01/01/17 1628    Clinical Impression Statement  Patient able to tolerate brief closed chain exercises today.  "I t felt better than i expected".  She lost a few degrees of flexion initially when  compared to last measurements and was able to regain motion with exercises.  See flow sheet.  Hip strength , and knee strength improving. see flow sheet.   Modified independent with initial HEP,  Needs Sheets to refer.Marland Kitchen  LTG#5 is partiallt met .Patient is able to walk 30 feet in 14 seconds. Patient may not return until after Christmas.    PT Next Visit Plan  , cont with gentle strength knees and hips, flexibility.  try more closed chain consider heel lifts, toe lifts,  weight shifting,  2 inch step Up?    PT Home Exercise Plan  HS stretch, SLR,  heel slides, quad sets,  LAQ, hip abd     Consulted and Agree with Plan of Care  Patient       Patient will benefit from skilled therapeutic intervention in order to improve the following deficits and impairments:     Visit  Diagnosis: Chronic pain of left knee  Chronic pain of right knee  Stiffness of left knee, not elsewhere classified  Difficulty in walking, not elsewhere classified  Muscle weakness (generalized)     Problem List Patient Active Problem List   Diagnosis Date Noted  . Allergic contact dermatitis due to metals 12/16/2016  . Abnormal gait 04/10/2016  . Dyslipidemia 08/12/2015  . Dyspnea on exertion 08/12/2015  . Palpitations 08/12/2015  . Pes anserine bursitis 08/10/2015  . Rupture of left posterior tibialis tendon 03/25/2015  . OSA (obstructive sleep apnea) 03/05/2015  . Navicular fracture of ankle 01/21/2015  . Sinus infection 12/25/2014  . Uric acid arthropathy 03/23/2014  . High plasma oxalate 02/17/2014  . Nonallopathic lesion of cervical region 12/19/2012  . Fibroids 05/28/2012  . Family history of ovarian cancer 05/28/2012  . Strain of quadriceps muscle 04/17/2012  . Nonallopathic lesion of costovertebral region 01/23/2012  . Left shoulder pain 12/20/2011  . Rotator cuff disorder 09/26/2011  . Bilateral foot pain 09/20/2011  . Arthralgia of multiple joints 07/26/2011  . Arthritis of knee, degenerative 07/26/2011  . Arthritis of hand, degenerative 07/26/2011  . Low back pain 07/26/2011  . Vitamin B 12 deficiency 03/05/2011  . TMJ (temporomandibular joint syndrome) 03/05/2011  . Pes planus 03/01/2011  . Right knee pain 03/01/2011  . Greater trochanteric bursitis of left hip 03/01/2011  . Nonallopathic lesion of lumbosacral region 01/27/2011  . Obesity 12/29/2010  . Fatigue 12/29/2010  . Allergic rhinitis, cause unspecified   . Chronic pain   . GERD (gastroesophageal reflux disease)   . Sleep apnea     HARRIS,KAREN PTA 01/01/2017, 6:20 PM  [place], [place], [address] Phone: [phone]   Fax:  [phone]  Name: LOVELY KERINS MRN: [id number] Date of Birth: 02/10/1950

## 2017-01-02 ENCOUNTER — Encounter (INDEPENDENT_AMBULATORY_CARE_PROVIDER_SITE_OTHER): Payer: Self-pay

## 2017-01-02 ENCOUNTER — Encounter: Payer: Self-pay | Admitting: Allergy and Immunology

## 2017-01-03 ENCOUNTER — Ambulatory Visit (INDEPENDENT_AMBULATORY_CARE_PROVIDER_SITE_OTHER): Payer: Medicare Other | Admitting: Family Medicine

## 2017-01-03 ENCOUNTER — Encounter: Payer: Self-pay | Admitting: Family Medicine

## 2017-01-03 ENCOUNTER — Encounter: Payer: Medicare Other | Admitting: Physical Therapy

## 2017-01-03 VITALS — BP 140/88 | HR 105 | Ht 60.0 in | Wt 241.0 lb

## 2017-01-03 DIAGNOSIS — M545 Low back pain, unspecified: Secondary | ICD-10-CM

## 2017-01-03 DIAGNOSIS — M999 Biomechanical lesion, unspecified: Secondary | ICD-10-CM

## 2017-01-03 DIAGNOSIS — G8929 Other chronic pain: Secondary | ICD-10-CM

## 2017-01-03 NOTE — Patient Instructions (Signed)
Good to see you  Happy holidays!  We will get the kit  I will add my labs When it come to the teeth, I would pull em.  See me again in 4 weeks (30 minute appointment please)

## 2017-01-03 NOTE — Assessment & Plan Note (Signed)
Decision today to treat with OMT was based on Physical Exam  After verbal consent patient was treated with HVLA, ME, FPR techniques in cervical, thoracic, lumbar and sacral areas  Patient tolerated the procedure well with improvement in symptoms  Patient given exercises, stretches and lifestyle modifications  See medications in patient instructions if given  Patient will follow up in 4 weeks 

## 2017-01-03 NOTE — Assessment & Plan Note (Signed)
Continues to have low back pain.  Likely secondary to osteoarthritic changes of multiple joints.  Responds fairly well to osteopathic manipulation.  We discussed home exercises again, encourage weight loss.  Likely still a seronegative toyed arthritis.  Strong family history of this.  In addition to this patient has had elevated uric acids previously.  Will consider further laboratory workup again.  Patient is in agreement with that plan.  Patient will continue to look into metal testing and we will attempt to get a testing kit this likely will be helpful.  Depending on this this could change medical management.  Otherwise patient will continue to follow-up in 4-week intervals.

## 2017-01-03 NOTE — Progress Notes (Signed)
[name] [place], [place] [address] Phone: [phone] Subjective:    I'm seeing this patient by the request  of:    CC: Polymyalgia and back pain  EXN:[id number]  Kerry [name] is a 66 y.o. female coming in with complaint of back pain.  Patient has had difficulty with this for quite some time.  Patient is concerned for potential metal allergy.  Patient has gone through multiple other different concerns and different treatment.  Patient has had multiple different allergies to medications.  Patient feels that different dental procedures and metal toxicity could be contributing.  Patient did see an allergist.  Did have skin testing and patient states did have cobalt allergy.  We had difficulty getting a heavy metal panel done here.  Patient has looked out side of the lab here for potential testing.  Brings in information today that I had thoroughly looked at.  Wants to consider a different type of testing before patient considers possible knee replacement.  -     Past Medical History:  Diagnosis Date  . Abnormal uterine bleeding   . Allergic rhinitis, cause unspecified   . Arthritis of knee, right   . Bursitis of left shoulder   . 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 Chronic pain   . Dyspareunia   . Fibroid   . Foot fracture, left 01/2015   and torn tendons  . GERD (gastroesophageal reflux disease)   . HTN (hypertension)   . Kidney stones   . Sleep apnea   . Urinary incontinence    Past Surgical History:  Procedure Laterality Date  .  dilatation and curettage  2000  . CARPAL TUNNEL RELEASE  1998  . CATARACT EXTRACTION Bilateral   . dental implants  2010  . ESOPHAGEAL DILATION  2010  . HAMMER TOE SURGERY     rt  . PELVIC LAPAROSCOPY  1990  . ROTATOR CUFF REPAIR Right 09/2011   Dr. Tamera Punt  . ROTATOR CUFF REPAIR Left 11/11/13   Dr. Tamera Punt   . SEPTOPLASTY    . UMBILICAL HERNIA REPAIR  2010   Social History    Socioeconomic History  . Marital status: Married    Spouse name: None  . Number of children: None  . Years of education: None  . Highest education level: None  Social Needs  . Financial resource strain: None  . Food insecurity - worry: None  . Food insecurity - inability: None  . Transportation needs - medical: None  . Transportation needs - non-medical: None  Occupational History  . Occupation: Retired  Tobacco Use  . Smoking status: Never Smoker  . Smokeless tobacco: Never Used  Substance and Sexual Activity  . Alcohol use: No    Alcohol/week: 0.0 oz  . Drug use: No  . Sexual activity: No    Partners: Male    Birth control/protection: Post-menopausal  Other Topics Concern  . None  Social History Narrative  . None   Allergies  Allergen Reactions  . Cefixime Swelling  . Celebrex [Celecoxib] Swelling  . Cobalt Other (See Comments)    Per allergist  . Gabapentin Other (See Comments)    Joint pain  . Levofloxacin Other (See Comments)    Muscle and joint pain  . Molybdenum Other (See Comments)    Per allergist  . Other Other (See Comments)    TEQUIN. TEQUIN - JOINT AND MUSCLE PAIN Other Reaction: painful joints TEQUIN. [place] - JOINT  AND MUSCLE PAIN [place] Metal  . Penicillins Hives  . Robaxin [Methocarbamol]   . Tomato Hives  . Adhesive [Tape] Rash    Sensitive to EKG leads  . Tetracycline Hcl Rash    Can take Doxy  . Tetracyclines & Related Rash   Family History  Problem Relation Age of Onset  . Rheumatologic disease Mother   . Diabetes Mother   . Heart attack Father   . Rheumatologic disease Sister   . Arthritis Sister        --Rheumatoid arthritis  . Ovarian cancer Sister 67  . Crohn's disease Brother   . Kidney failure Brother   . Ulcerative colitis Brother   . Glaucoma Brother      Past medical history, social, surgical and family history all reviewed in electronic medical record.  No pertanent information unless stated regarding to the  chief complaint.   Review of Systems:Review of systems updated and as accurate as of 01/03/17  No headache, visual changes, nausea, vomiting, diarrhea, constipation, dizziness, abdominal pain, skin rash, fevers, chills, night sweats, weight loss, swollen lymph nodes, body aches, joint swelling, s, chest pain, shortness of breath, mood changes.  Positive muscle aches positive joint swelling  Objective  Blood pressure 140/88, pulse (!) 105, height 5' (1.524 m), weight 241 lb (109.3 kg), last menstrual period 01/16/2002, SpO2 98 %. Systems examined below as of 01/03/17   General: No apparent distress alert and oriented x3 mood and affect normal, dressed appropriately.  HEENT: Pupils equal, extraocular movements intact  Respiratory: Patient's speak in full sentences and does not appear short of breath  Cardiovascular: No lower extremity edema, non tender, no erythema  Skin: Warm dry intact with no signs of infection or rash on extremities or on axial skeleton.  The patient does have fungal areas between the middle and ring finger on the right hand Abdomen: Soft nontender  Neuro: Cranial nerves II through XII are intact, neurovascularly intact in all extremities with 2+ DTRs and 2+ pulses.  Lymph: No lymphadenopathy of posterior or anterior cervical chain or axillae bilaterally.  Gait antalgic gait MSK: Moderate tender with full range of motion and good stability and symmetric strength and tone of shoulders, elbows, wrist, hip, knee and ankles bilaterally.  Back exam shows decreased range of motion in all planes.  Significant tightness of the hamstrings bilaterally.  Unable to do [place] secondary to tightness.  Neurovascularly intact distally.  Moderate discomfort in the paraspinal musculature of the lumbar spine diffusely  Osteopathic findings  C2 flexed rotated and side bent right C4 flexed rotated and side bent left T3 extended rotated and side bent right inhaled third rib T6 extended rotated  and side bent left L2 flexed rotated and side bent right Sacrum right on right    Impression and Recommendations:     This case required medical decision making of moderate complexity.      Note: This dictation was prepared with Dragon dictation along with smaller phrase technology. Any transcriptional errors that result from this process are unintentional.

## 2017-01-05 ENCOUNTER — Encounter: Payer: Self-pay | Admitting: *Deleted

## 2017-01-17 ENCOUNTER — Ambulatory Visit: Payer: Medicare Other | Admitting: Cardiology

## 2017-01-17 ENCOUNTER — Encounter: Payer: Self-pay | Admitting: Allergy and Immunology

## 2017-01-17 ENCOUNTER — Ambulatory Visit (INDEPENDENT_AMBULATORY_CARE_PROVIDER_SITE_OTHER): Payer: Medicare Other | Admitting: Allergy and Immunology

## 2017-01-17 VITALS — BP 158/88 | HR 92 | Resp 14

## 2017-01-17 DIAGNOSIS — Z91040 Latex allergy status: Secondary | ICD-10-CM

## 2017-01-17 NOTE — Progress Notes (Signed)
Kerry [name] returns to this clinic to have latex testing performed.  Using a combination of epicutaneous, intradermal, and respiratory exposure to latex she did not demonstrate any hypersensitivity to this exposure during today's evaluation.

## 2017-01-18 ENCOUNTER — Encounter: Payer: Self-pay | Admitting: Allergy and Immunology

## 2017-01-18 ENCOUNTER — Encounter: Payer: Self-pay | Admitting: Physical Therapy

## 2017-01-18 ENCOUNTER — Encounter: Payer: Self-pay | Admitting: Cardiology

## 2017-01-18 ENCOUNTER — Ambulatory Visit (INDEPENDENT_AMBULATORY_CARE_PROVIDER_SITE_OTHER): Payer: Medicare Other | Admitting: Cardiology

## 2017-01-18 ENCOUNTER — Ambulatory Visit: Payer: Medicare Other | Attending: Family Medicine | Admitting: Physical Therapy

## 2017-01-18 VITALS — BP 150/84 | HR 115 | Ht 60.0 in | Wt 241.1 lb

## 2017-01-18 DIAGNOSIS — M25662 Stiffness of left knee, not elsewhere classified: Secondary | ICD-10-CM | POA: Insufficient documentation

## 2017-01-18 DIAGNOSIS — R262 Difficulty in walking, not elsewhere classified: Secondary | ICD-10-CM | POA: Diagnosis not present

## 2017-01-18 DIAGNOSIS — M25562 Pain in left knee: Secondary | ICD-10-CM | POA: Insufficient documentation

## 2017-01-18 DIAGNOSIS — G8929 Other chronic pain: Secondary | ICD-10-CM | POA: Insufficient documentation

## 2017-01-18 DIAGNOSIS — G4733 Obstructive sleep apnea (adult) (pediatric): Secondary | ICD-10-CM | POA: Diagnosis not present

## 2017-01-18 DIAGNOSIS — M7989 Other specified soft tissue disorders: Secondary | ICD-10-CM | POA: Diagnosis not present

## 2017-01-18 DIAGNOSIS — M25661 Stiffness of right knee, not elsewhere classified: Secondary | ICD-10-CM | POA: Diagnosis not present

## 2017-01-18 DIAGNOSIS — E785 Hyperlipidemia, unspecified: Secondary | ICD-10-CM | POA: Diagnosis not present

## 2017-01-18 DIAGNOSIS — M25561 Pain in right knee: Secondary | ICD-10-CM | POA: Diagnosis not present

## 2017-01-18 DIAGNOSIS — R002 Palpitations: Secondary | ICD-10-CM

## 2017-01-18 DIAGNOSIS — M6281 Muscle weakness (generalized): Secondary | ICD-10-CM

## 2017-01-18 MED ORDER — FUROSEMIDE 20 MG PO TABS: 20.0000 mg | ORAL_TABLET | Freq: Every day | ORAL | 3 refills | Status: DC

## 2017-01-18 NOTE — Progress Notes (Signed)
Cardiology Office Note:    Date:  01/18/2017   ID:  Kerry [name], DOB [birth year], MRN [id number]  PCP:  Street, [name], MD  Cardiologist:  Jenne Campus, MD    Referring MD: Street, [name], *   Chief Complaint  Patient presents with  . 3 week follow up  Doing well cardiac wise still does have some swelling of lower extremities.  History of Present Illness:    Kerry [name] is a 67 y.o. female with swelling of lower extremities that being aggressively investigated so far workup is negative.  She denies having any pain in her legs I did proBNP which was normal, I did echocardiogram which showed preserved left ventricular ejection fraction.  Since this is a pitting edema I think still it would be reasonable to small dose of diuretic I will give her prescription for 20 mg of furosemide and use it on as needed basis.  In the meantime she is still investigating potential allergy to multiple metals that being done by her oncologist as well as by internal medicine team.  Past Medical History:  Diagnosis Date  . Abnormal uterine bleeding   . Allergic rhinitis, cause unspecified   . Arthritis of knee, right   . Bursitis of left shoulder   . 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 Chronic pain   . Dyspareunia   . Fibroid   . Foot fracture, left 01/2015   and torn tendons  . GERD (gastroesophageal reflux disease)   . HTN (hypertension)   . Kidney stones   . Sleep apnea   . Urinary incontinence     Past Surgical History:  Procedure Laterality Date  .  dilatation and curettage  2000  . CARPAL TUNNEL RELEASE  1998  . CATARACT EXTRACTION Bilateral   . dental implants  2010  . ESOPHAGEAL DILATION  2010  . HAMMER TOE SURGERY     rt  . PELVIC LAPAROSCOPY  1990  . ROTATOR CUFF REPAIR Right 09/2011   Dr. Tamera Punt  . ROTATOR CUFF REPAIR Left 11/11/13   Dr. Tamera Punt   . SEPTOPLASTY    . UMBILICAL HERNIA REPAIR  2010    Current Medications: Current Meds  Medication  Sig  . Cholecalciferol (VITAMIN D3) 5000 units CAPS Take 5,000 Units by mouth every other day.   . Chromium Picolinate 500 MCG CAPS Take 1 capsule by mouth 2 (two) times daily.  . Cyanocobalamin (B-12) 1000 MCG/ML KIT Inject 1 Dose as directed every 14 (fourteen) days.  . Multiple Vitamin (MULTIVITAMIN) tablet Take 1 tablet by mouth daily.   . Needles & Syringes MISC 1 Can by Does not apply route once a week. Dispense meals for B12 IM injections please thank you  . Probiotic Product (PROBIOTIC ADVANCED PO) Take by mouth. Patient states taking 5 billion, one capsule daily.  . vitamin C (ASCORBIC ACID) 500 MG tablet Take 500 mg by mouth 2 (two) times daily.      Allergies:   Cefixime; Celebrex [celecoxib]; Cobalt; Gabapentin; Levofloxacin; Molybdenum; Other; Penicillins; Robaxin [methocarbamol]; Tomato; Adhesive [tape]; Tetracycline hcl; and Tetracyclines & related   Social History   Socioeconomic History  . Marital status: Married    Spouse name: None  . Number of children: None  . Years of education: None  . Highest education level: None  Social Needs  . Financial resource strain: None  . Food insecurity - worry: None  . Food insecurity - inability: None  . Transportation needs -  medical: None  . Transportation needs - non-medical: None  Occupational History  . Occupation: Retired  Tobacco Use  . Smoking status: Never Smoker  . Smokeless tobacco: Never Used  Substance and Sexual Activity  . Alcohol use: No    Alcohol/week: 0.0 oz  . Drug use: No  . Sexual activity: No    Partners: Male    Birth control/protection: Post-menopausal  Other Topics Concern  . None  Social History Narrative  . None     Family History: The patient's family history includes Arthritis in her sister; Crohn's disease in her brother; Diabetes in her mother; Glaucoma in her brother; Heart attack in her father; Kidney failure in her brother; Ovarian cancer (age of onset: 45) in her sister;  Rheumatologic disease in her mother and sister; Ulcerative colitis in her brother. ROS:   Please see the history of present illness.    All 14 point review of systems negative except as described per history of present illness  EKGs/Labs/Other Studies Reviewed:      Recent Labs: 09/01/2016: Hemoglobin 15.0; Platelets 248.0 12/27/2016: BUN 17; Creatinine, Ser 0.74; NT-Pro BNP 68; Potassium 4.4; Sodium 145  Recent Lipid Panel    Component Value Date/Time   CHOL 257 (H) 02/09/2011 1027   TRIG 136 02/09/2011 1027   HDL 46 02/09/2011 1027   CHOLHDL 5.6 02/09/2011 1027   VLDL 27 02/09/2011 1027   LDLCALC 184 (H) 02/09/2011 1027    Physical Exam:    VS:  BP (!) 150/84   Pulse (!) 115   Ht 5' (1.524 m)   Wt 241 lb 1.9 oz (109.4 kg)   LMP 01/16/2002   SpO2 97%   BMI 47.09 kg/m     Wt Readings from Last 3 Encounters:  01/18/17 241 lb 1.9 oz (109.4 kg)  01/03/17 241 lb (109.3 kg)  12/27/16 239 lb (108.4 kg)     GEN:  Well nourished, well developed in no acute distress HEENT: Normal NECK: No JVD; No carotid bruits LYMPHATICS: No lymphadenopathy CARDIAC: RRR, no murmurs, no rubs, no gallops RESPIRATORY:  Clear to auscultation without rales, wheezing or rhonchi  ABDOMEN: Soft, non-tender, non-distended MUSCULOSKELETAL:  No edema; No deformity  SKIN: Warm and dry LOWER EXTREMITIES: no swelling NEUROLOGIC:  Alert and oriented x 3 PSYCHIATRIC:  Normal affect   ASSESSMENT:    1. OSA (obstructive sleep apnea)   2. Dyslipidemia   3. Palpitations   4. Swelling of both lower extremities    PLAN:    In order of problems listed above:  1. Obstructive sleep apnea: Followed by internal medicine team. 2. Dyslipidemia does not want to add any medications 3. Patient's: Denies having any now. 4. Swelling of lower extremities will start small dose of diuretic only 20 mg of furosemide on as-needed basis   Medication Adjustments/Labs and Tests Ordered: Current medicines are  reviewed at length with the patient today.  Concerns regarding medicines are outlined above.  No orders of the defined types were placed in this encounter.  Medication changes: No orders of the defined types were placed in this encounter.   Signed, Robert J. Krasowski, MD, FACC 01/18/2017 4:22 PM     [facility] 

## 2017-01-18 NOTE — Patient Instructions (Signed)
Medication Instructions:  Your physician has recommended you make the following change in your medication:  1) Start Furosemide 20 mg 1 tablet daily  Labwork: None ordered  Testing/Procedures: None ordered  Follow-Up: Your physician recommends that you schedule a follow-up appointment in: 2-3 months with Dr. Agustin Cree   Any Other Special Instructions Will Be Listed Below (If Applicable).     If you need a refill on your cardiac medications before your next appointment, please call your pharmacy.

## 2017-01-18 NOTE — Therapy (Addendum)
[place], [place], [address] Phone: [phone]   Fax:  [phone]  Physical Therapy Treatment/Addended Discharge  Patient Details  Name: RASHAWNA SCOLES MRN: [id number] Date of Birth: 1950-12-04 Referring Provider: Dr. Hulan Saas   Encounter Date: 01/18/2017  PT End of Session - 01/18/17 1347    Visit Number  7    Number of Visits  15    Date for PT Re-Evaluation  02/09/17    PT Start Time  1330    PT Stop Time  1430    PT Time Calculation (min)  60 min    Activity Tolerance  Patient tolerated treatment well    Behavior During Therapy  [facility] for tasks assessed/performed       Past Medical History:  Diagnosis Date  . Abnormal uterine bleeding   . Allergic rhinitis, cause unspecified   . Arthritis of knee, right   . Bursitis of left shoulder   . 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 Chronic pain   . Dyspareunia   . Fibroid   . Foot fracture, left 01/2015   and torn tendons  . GERD (gastroesophageal reflux disease)   . HTN (hypertension)   . Kidney stones   . Sleep apnea   . Urinary incontinence     Past Surgical History:  Procedure Laterality Date  .  dilatation and curettage  2000  . CARPAL TUNNEL RELEASE  1998  . CATARACT EXTRACTION Bilateral   . dental implants  2010  . ESOPHAGEAL DILATION  2010  . HAMMER TOE SURGERY     rt  . PELVIC LAPAROSCOPY  1990  . ROTATOR CUFF REPAIR Right 09/2011   Dr. Tamera Punt  . ROTATOR CUFF REPAIR Left 11/11/13   Dr. Tamera Punt   . SEPTOPLASTY    . UMBILICAL HERNIA REPAIR  2010    There were no vitals filed for this visit.  Subjective Assessment - 01/18/17 1334    Subjective  Overall my knees feel more capable.  Pain is the same.  Widespread pain in joints.      Currently in Pain?  Yes    Pain Score  5     Pain Location  Knee    Pain Orientation  Right;Left R>L     Pain Descriptors / Indicators  Aching    Pain Type  Chronic pain    Pain Onset   More than a month ago    Pain Frequency  Intermittent         [facility] PT Assessment - 01/18/17 0001      AROM   Right Knee Extension  6    Right Knee Flexion  106    Left Knee Extension  5    Left Knee Flexion  110          [facility] Adult PT Treatment/Exercise - 01/18/17 0001      Self-Care   Other Self-Care Comments   activities and HEP, recommended prioritizing      Knee/Hip Exercises: Aerobic   Nustep  L5, 5 minutes      Knee/Hip Exercises: Standing   Hip Flexion  Stengthening    Hip Flexion Limitations  marching with UE support     Hip Abduction  1 set;10 reps    Abduction Limitations  needed to hold the counter. pain in weightbearing      Knee/Hip Exercises: Seated   Long Arc Quad  Strengthening;Both;2 sets;10 reps    Long [employer]  Weight  4 lbs.    Sit to Sand  1 set;10 reps;without UE support      Knee/Hip Exercises: Sidelying   Hip ABduction  Strengthening;Both;2 sets;10 reps    Clams  2 x 10       Moist Heat Therapy   Number Minutes Moist Heat  10 Minutes    Moist Heat Location  Knee bilateral             PT Education - 01/18/17 1350    Education provided  Yes    Education Details  HEP frequency, details, POC< renewal     Person(s) Educated  Patient    Methods  Explanation;Demonstration    Comprehension  Verbalized understanding;Returned demonstration;Verbal cues required;Tactile cues required          PT Long Term Goals - 01/18/17 1348      PT LONG TERM GOAL #1   Title  Patient will be I with HEP for LE strength and knee AROM.     Baseline  modified independent  with those issued so far    Status  On-going      PT LONG TERM GOAL #2   Title  Pt will be able to improve her FOTO score to less than 60% impaired to show improved functional mobility.     Status  On-going      PT LONG TERM GOAL #3   Title  Pt will be able to do a portion of dusting, cooking, light housework in standing, up to 15-20 min at a time without sititng down.      Status  Partially Met      PT LONG TERM GOAL #4   Title  Pt will be able to walk at gait speed with cane and improved confidence in Rt  knee.  (>2.62 feet/sec)     Status  On-going      PT LONG TERM GOAL #5   Title  Pt will increase hip strength to >4/5 for better gait stability    Baseline  4/5 for muscles tested    Status  Partially Met            Plan - 01/18/17 1359    Clinical Impression Statement  Patient is doing well, cont with pain but reports subjective improvements in confidence and stability with gait.  Her FOTO score is     PT Frequency  2x / week 1-2 times per week     PT Duration  4 weeks    PT Treatment/Interventions  ADLs/Self Care Home Management;Gait training;Stair training;Neuromuscular re-education;Passive range of motion;Manual techniques;Moist Heat;Dry needling;Taping;Vasopneumatic Device;Balance training;Therapeutic exercise;Therapeutic activities;Patient/family education;Functional mobility training;Cryotherapy;Electrical Stimulation    PT Next Visit Plan  , cont with gentle strength knees and hips, flexibility.  try more closed chain consider heel lifts, toe lifts,  weight shifting,  2 inch step Up?    PT Home Exercise Plan  HS stretch, SLR,  heel slides, quad sets,  LAQ,, hip abd     Consulted and Agree with Plan of Care  Patient       Patient will benefit from skilled therapeutic intervention in order to improve the following deficits and impairments:  Abnormal gait, Increased fascial restricitons, Pain, Postural dysfunction, Decreased mobility, Decreased strength, Decreased range of motion, Hypomobility, Obesity, Impaired flexibility, Difficulty walking, Decreased balance, Decreased endurance  Visit Diagnosis: Chronic pain of left knee  Chronic pain of right knee  Stiffness of left knee, not elsewhere classified  Difficulty in walking, not elsewhere  classified  Muscle weakness (generalized)  Stiffness of right knee, not elsewhere  classified     Problem List Patient Active Problem List   Diagnosis Date Noted  . Nonallopathic lesion of sacral region 01/03/2017  . Allergic contact dermatitis due to metals 12/16/2016  . Abnormal gait 04/10/2016  . Dyslipidemia 08/12/2015  . Dyspnea on exertion 08/12/2015  . Palpitations 08/12/2015  . Pes anserine bursitis 08/10/2015  . Rupture of left posterior tibialis tendon 03/25/2015  . OSA (obstructive sleep apnea) 03/05/2015  . Navicular fracture of ankle 01/21/2015  . Sinus infection 12/25/2014  . Uric acid arthropathy 03/23/2014  . High plasma oxalate 02/17/2014  . Nonallopathic lesion of cervical region 12/19/2012  . Fibroids 05/28/2012  . Family history of ovarian cancer 05/28/2012  . Strain of quadriceps muscle 04/17/2012  . Nonallopathic lesion of costovertebral region 01/23/2012  . Left shoulder pain 12/20/2011  . Rotator cuff disorder 09/26/2011  . Bilateral foot pain 09/20/2011  . Arthralgia of multiple joints 07/26/2011  . Arthritis of knee, degenerative 07/26/2011  . Arthritis of hand, degenerative 07/26/2011  . Low back pain 07/26/2011  . Vitamin B 12 deficiency 03/05/2011  . TMJ (temporomandibular joint syndrome) 03/05/2011  . Pes planus 03/01/2011  . Right knee pain 03/01/2011  . Greater trochanteric bursitis of left hip 03/01/2011  . Nonallopathic lesion of lumbosacral region 01/27/2011  . Obesity 12/29/2010  . Fatigue 12/29/2010  . Allergic rhinitis, cause unspecified   . Chronic pain   . GERD (gastroesophageal reflux disease)   . Sleep apnea     Persephonie Hegwood 01/18/2017, 2:28 PM  [facility] [address] [place], [place], [address] Phone: [phone]   Fax:  [phone]  Name: SHARLEEN SZCZESNY MRN: [id number] Date of Birth: January 15, 1951   Raeford Razor, PT 01/18/17 2:29 PM Phone: [phone] Fax: [phone]   PHYSICAL THERAPY DISCHARGE SUMMARY  Visits from Start of Care:  7  Current functional level related to goals / functional outcomes: See above   Remaining deficits: Knee pain, weakness, gait, stiffness.    Education / Equipment: HEP, RICE   Plan: Patient agrees to discharge.  Patient goals were not met. Patient is being discharged due to the patient's request.  ?????     Patient asked to be rescheduled for after she has surgery to remove dental hardware which is causing her diffuse joint pain  (metal allergy)  Raeford Razor, PT 01/29/17 8:47 AM Phone: [phone] Fax: [phone]

## 2017-01-19 ENCOUNTER — Other Ambulatory Visit: Payer: Self-pay

## 2017-01-19 DIAGNOSIS — M791 Myalgia, unspecified site: Secondary | ICD-10-CM

## 2017-01-19 DIAGNOSIS — M2550 Pain in unspecified joint: Secondary | ICD-10-CM

## 2017-01-22 ENCOUNTER — Encounter: Payer: Self-pay | Admitting: Physical Therapy

## 2017-01-22 ENCOUNTER — Encounter: Payer: Self-pay | Admitting: Family Medicine

## 2017-01-24 ENCOUNTER — Ambulatory Visit: Payer: Medicare Other | Admitting: Physical Therapy

## 2017-01-24 ENCOUNTER — Other Ambulatory Visit (INDEPENDENT_AMBULATORY_CARE_PROVIDER_SITE_OTHER): Payer: Medicare Other

## 2017-01-24 DIAGNOSIS — M791 Myalgia, unspecified site: Secondary | ICD-10-CM | POA: Diagnosis not present

## 2017-01-24 DIAGNOSIS — M2550 Pain in unspecified joint: Secondary | ICD-10-CM | POA: Diagnosis not present

## 2017-01-24 LAB — URIC ACID: Uric Acid, Serum: 7.3 mg/dL — ABNORMAL HIGH (ref 2.4–7.0)

## 2017-01-24 LAB — TSH: TSH: 1.46 u[IU]/mL (ref 0.35–4.50)

## 2017-01-24 LAB — SEDIMENTATION RATE: SED RATE: 21 mm/h (ref 0–30)

## 2017-01-24 LAB — CORTISOL: Cortisol, Plasma: 8.3 ug/dL

## 2017-01-27 LAB — ANA: ANA: NEGATIVE

## 2017-01-27 LAB — PTH, INTACT AND CALCIUM
Calcium: 9.5 mg/dL (ref 8.6–10.4)
PTH: 41 pg/mL (ref 14–64)

## 2017-01-27 LAB — T4: T4, Total: 7.8 ug/dL (ref 5.1–11.9)

## 2017-01-27 LAB — DHEA: DHEA: 143 ng/dL

## 2017-01-27 LAB — T3: T3, Total: 102 ng/dL (ref 76–181)

## 2017-01-31 ENCOUNTER — Encounter: Payer: Medicare Other | Admitting: Physical Therapy

## 2017-01-31 ENCOUNTER — Ambulatory Visit (INDEPENDENT_AMBULATORY_CARE_PROVIDER_SITE_OTHER): Payer: Medicare Other | Admitting: Family Medicine

## 2017-01-31 ENCOUNTER — Encounter: Payer: Self-pay | Admitting: Family Medicine

## 2017-01-31 VITALS — BP 150/98 | HR 112 | Ht 60.0 in | Wt 238.8 lb

## 2017-01-31 DIAGNOSIS — M170 Bilateral primary osteoarthritis of knee: Secondary | ICD-10-CM

## 2017-01-31 DIAGNOSIS — M999 Biomechanical lesion, unspecified: Secondary | ICD-10-CM

## 2017-01-31 DIAGNOSIS — M2550 Pain in unspecified joint: Secondary | ICD-10-CM

## 2017-01-31 NOTE — Progress Notes (Signed)
[name] [place], [place] [address] Phone: [phone] Subjective:     CC: Multiple complaints  [id number]:[id number]  Kerry [name] is a 67 y.o. female coming in with complaint of multiple muscle and joint pain.  Seronegative rheumatoid arthritis.  Also possibility of metal allergy.  We have discussed multiple different treatment options and patient has declined numerous abdomen.  Patient is following up with a dentist to have removal of any type of metal in her teeth.  Patient is awaiting the private laboratory results of a lab for other significant metals.  Known severe arthritic changes of the knees bilaterally and is in need for once again concerned secondary to the cobalt.  Patient states she is doing better with the removal of the metals in her mouth and feels that the swelling in her hands, feet, and even face seems to be improving.  Noticing not as much pain overall.  Feels that urinary and GI complaints have also improved.      Past Medical History:  Diagnosis Date  . Abnormal uterine bleeding   . Allergic rhinitis, cause unspecified   . Arthritis of knee, right   . Bursitis of left shoulder   . 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 Chronic pain   . Dyspareunia   . Fibroid   . Foot fracture, left 01/2015   and torn tendons  . GERD (gastroesophageal reflux disease)   . HTN (hypertension)   . Kidney stones   . Sleep apnea   . Urinary incontinence    Past Surgical History:  Procedure Laterality Date  .  dilatation and curettage  2000  . CARPAL TUNNEL RELEASE  1998  . CATARACT EXTRACTION Bilateral   . dental implants  2010  . ESOPHAGEAL DILATION  2010  . HAMMER TOE SURGERY     rt  . PELVIC LAPAROSCOPY  1990  . ROTATOR CUFF REPAIR Right 09/2011   Dr. Tamera Punt  . ROTATOR CUFF REPAIR Left 11/11/13   Dr. Tamera Punt   . SEPTOPLASTY    . UMBILICAL HERNIA REPAIR  2010   Social History   Socioeconomic History  . Marital status:  Married    Spouse name: Not on file  . Number of children: Not on file  . Years of education: Not on file  . Highest education level: Not on file  Social Needs  . Financial resource strain: Not on file  . Food insecurity - worry: Not on file  . Food insecurity - inability: Not on file  . Transportation needs - medical: Not on file  . Transportation needs - non-medical: Not on file  Occupational History  . Occupation: Retired  Tobacco Use  . Smoking status: Never Smoker  . Smokeless tobacco: Never Used  Substance and Sexual Activity  . Alcohol use: No    Alcohol/week: 0.0 oz  . Drug use: No  . Sexual activity: No    Partners: Male    Birth control/protection: Post-menopausal  Other Topics Concern  . Not on file  Social History Narrative  . Not on file   Allergies  Allergen Reactions  . Cefixime Swelling  . Celebrex [Celecoxib] Swelling  . Cobalt Other (See Comments)    Per allergist  . Gabapentin Other (See Comments)    Joint pain  . Levofloxacin Other (See Comments)    Muscle and joint pain  . Molybdenum Other (See Comments)    Per allergist  . Other Other (See Comments)  TEQUIN. TEQUIN - JOINT AND MUSCLE PAIN Other Reaction: painful joints TEQUIN. TEQUIN - JOINT AND MUSCLE PAIN Tantal Metal  . Penicillins Hives  . Robaxin [Methocarbamol]   . Tomato Hives  . Adhesive [Tape] Rash    Sensitive to EKG leads  . Tetracycline Hcl Rash    Can take Doxy  . Tetracyclines & Related Rash   Family History  Problem Relation Age of Onset  . Rheumatologic disease Mother   . Diabetes Mother   . Heart attack Father   . Rheumatologic disease Sister   . Arthritis Sister        --Rheumatoid arthritis  . Ovarian cancer Sister 38  . Crohn's disease Brother   . Kidney failure Brother   . Ulcerative colitis Brother   . Glaucoma Brother      Past medical history, social, surgical and family history all reviewed in electronic medical record.  No pertanent information  unless stated regarding to the chief complaint.   Review of Systems:Review of systems updated and as accurate as of 01/31/17  No headache, visual changes, nausea, vomiting, diarrhea, constipation, dizziness, abdominal pain, skin rash, fevers, chills, night sweats, weight loss, swollen lymph nodes, body aches, joint swelling, chest pain, shortness of breath, mood changes.  Positive muscle aches to  Objective  Blood pressure (!) 150/98, pulse (!) 112, height 5' (1.524 m), weight 245 lb (111.1 kg), last menstrual period 01/16/2002, SpO2 98 %. Systems examined below as of 01/31/17   General: No apparent distress alert and oriented x3 mood and affect normal, dressed appropriately.  HEENT: Pupils equal, extraocular movements intact  Respiratory: Patient's speak in full sentences and does not appear short of breath  Cardiovascular: No lower extremity edema, non tender, no erythema  Skin: Warm dry intact with no signs of infection or rash on extremities or on axial skeleton.  Abdomen: Soft nontender  Neuro: Cranial nerves II through XII are intact, neurovascularly intact in all extremities with 2+ DTRs and 2+ pulses.  Lymph: No lymphadenopathy of posterior or anterior cervical chain or axillae bilaterally.  Gait antalgic MSK:  tender with full range of motion and good stability and symmetric strength and tone of shoulders, elbows, wrist, hip and ankles bilaterally.  Severe arthritic changes of the knees bilaterally  Back Exam:  Inspection: Degenerative scoliosis noted. Motion: Flexion 35 deg, Extension 25 deg, Side Bending to 35 deg bilaterally,  Rotation to 35 deg bilaterally  SLR laying: Negative  XSLR laying: Negative  Palpable tenderness: Tender to palpation the paraspinal musculature lumbar spine. FABER: Unable to do well secondary to arthritic changes. Sensory change: Gross sensation intact to all lumbar and sacral dermatomes.  Reflexes: 2+ at both patellar tendons, 2+ at achilles tendons,  Babinski's downgoing.  Strength at foot  Plantar-flexion: 5/5 Dorsi-flexion: 5/5 Eversion: 5/5 Inversion: 5/5  Leg strength  Quad: 5/5 Hamstring: 5/5 Hip flexor: 5/5 Hip abductors: 4/5 symmetric.  Osteopathic findings C4 flexed rotated and side bent left T3 extended rotated and side bent right inhaled third rib T7 extended rotated and side bent left L1 flexed rotated and side bent right Sacrum right on right     Impression and Recommendations:     This case required medical decision making of moderate complexity.      Note: This dictation was prepared with Dragon dictation along with smaller phrase technology. Any transcriptional errors that result from this process are unintentional.

## 2017-01-31 NOTE — Assessment & Plan Note (Signed)
Patient was to start conservative therapy.  No injections at this time.  Discussed icing regimen and topical anti-inflammatories.  Given trial.

## 2017-01-31 NOTE — Assessment & Plan Note (Signed)
Patient does have arthralgia of multiple joints.  Possible steroid.  Patient has been improvement with her having moved.  Laboratory workup did show potential for uric acid elevation and this could be contributing.  Patient though did not notice significant improvement with allopurinol in the past.  Discussed icing regimen and home exercises.  We discussed trying to be active in the importance of weight loss.  Follow-up again in 4 weeks

## 2017-01-31 NOTE — Assessment & Plan Note (Signed)
Decision today to treat with OMT was based on Physical Exam  After verbal consent patient was treated with  ME, FPR techniques in cervical, thoracic, lumbar and sacral areas  Patient tolerated the procedure well with improvement in symptoms  Patient given exercises, stretches and lifestyle modifications  See medications in patient instructions if given  Patient will follow up in 4 weeks 

## 2017-01-31 NOTE — Patient Instructions (Signed)
Good to see you  I am so glad you are doing better In my opinion get the teeth out For the neck Ice 20 minutes 2 times daily. Usually after activity and before bed. pennsaid pinkie amount topically 2 times daily as needed.   Lets watch the knees I will try to find the lab values.  See me again in 4 weeks (30 minute appointment )

## 2017-02-01 ENCOUNTER — Encounter: Payer: Self-pay | Admitting: Family Medicine

## 2017-02-04 ENCOUNTER — Encounter: Payer: Self-pay | Admitting: Family Medicine

## 2017-02-08 ENCOUNTER — Encounter: Payer: Medicare Other | Admitting: Physical Therapy

## 2017-02-09 ENCOUNTER — Encounter: Payer: Self-pay | Admitting: Allergy and Immunology

## 2017-02-13 ENCOUNTER — Encounter: Payer: Self-pay | Admitting: Allergy and Immunology

## 2017-02-14 ENCOUNTER — Encounter: Payer: Medicare Other | Admitting: Physical Therapy

## 2017-02-15 ENCOUNTER — Ambulatory Visit (INDEPENDENT_AMBULATORY_CARE_PROVIDER_SITE_OTHER): Payer: Medicare Other | Admitting: Allergy and Immunology

## 2017-02-15 ENCOUNTER — Encounter: Payer: Self-pay | Admitting: Allergy and Immunology

## 2017-02-15 VITALS — BP 142/78 | HR 88 | Resp 14

## 2017-02-15 DIAGNOSIS — J3089 Other allergic rhinitis: Secondary | ICD-10-CM | POA: Diagnosis not present

## 2017-02-15 NOTE — Progress Notes (Signed)
Kerry [name] returns to this clinic for skin testing.  She has a history of hayfever manifested as sneezing and nasal congestion that occurs on a perennial basis and responds somewhat to as needed over-the-counter antihistamines.  She is interested in identifying the allergens that are causing her problem.  Allergy skin testing was performed. She demonstrated hypersensitivity to house dust mite and mold. She will perform allergen avoidance measures and use anti-histamines as needed. She will contact me concerning her response to this plan.

## 2017-02-19 ENCOUNTER — Encounter: Payer: Self-pay | Admitting: Allergy and Immunology

## 2017-02-27 NOTE — Progress Notes (Signed)
[name] [place], [place] [address] Phone: [phone] Subjective:    I'm seeing this patient by the request  of:    CC: Polymyalgia follow-up  PXT:[id number]  Kerry [name] is a 67 y.o. female coming in with complaint of back pain. She has had more problems with her shoulder than her back. She has been seeing a massage therapist who has been working on the left trap which has been alleviating her pain and numbness in the left hand.  Responded fairly well to osteopathic manipulation.  Continues to try over-the-counter medications.  Patient has just noticed overall she has had an overall well-being of feeling better.  Has had some increasing stress with her family.    Past Medical History:  Diagnosis Date  . Abnormal uterine bleeding   . Allergic rhinitis, cause unspecified   . Arthritis of knee, right   . Bursitis of left shoulder   . 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 Chronic pain   . Dyspareunia   . Fibroid   . Foot fracture, left 01/2015   and torn tendons  . GERD (gastroesophageal reflux disease)   . HTN (hypertension)   . Kidney stones   . Sleep apnea   . Urinary incontinence    Past Surgical History:  Procedure Laterality Date  .  dilatation and curettage  2000  . CARPAL TUNNEL RELEASE  1998  . CATARACT EXTRACTION Bilateral   . dental implants  2010  . ESOPHAGEAL DILATION  2010  . HAMMER TOE SURGERY     rt  . PELVIC LAPAROSCOPY  1990  . ROTATOR CUFF REPAIR Right 09/2011   Dr. Tamera Punt  . ROTATOR CUFF REPAIR Left 11/11/13   Dr. Tamera Punt   . SEPTOPLASTY    . UMBILICAL HERNIA REPAIR  2010   Social History   Socioeconomic History  . Marital status: Married    Spouse name: None  . Number of children: None  . Years of education: None  . Highest education level: None  Social Needs  . Financial resource strain: None  . Food insecurity - worry: None  . Food insecurity - inability: None  . Transportation needs  - medical: None  . Transportation needs - non-medical: None  Occupational History  . Occupation: Retired  Tobacco Use  . Smoking status: Never Smoker  . Smokeless tobacco: Never Used  Substance and Sexual Activity  . Alcohol use: No    Alcohol/week: 0.0 oz  . Drug use: No  . Sexual activity: No    Partners: Male    Birth control/protection: Post-menopausal  Other Topics Concern  . None  Social History Narrative  . None   Allergies  Allergen Reactions  . Cefixime Swelling  . Celebrex [Celecoxib] Swelling  . Cobalt Other (See Comments)    Per allergist  . Gabapentin Other (See Comments)    Joint pain  . Levofloxacin Other (See Comments)    Muscle and joint pain  . Molybdenum Other (See Comments)    Per allergist  . Other Other (See Comments)    TEQUIN. TEQUIN - JOINT AND MUSCLE PAIN Other Reaction: painful joints TEQUIN. TEQUIN - JOINT AND MUSCLE PAIN Tantal Metal  . Penicillins Hives  . Robaxin [Methocarbamol]   . Tomato Hives  . Adhesive [Tape] Rash    Sensitive to EKG leads  . Tetracycline Hcl Rash    Can take Doxy  . Tetracyclines & Related Rash   Family History  Problem Relation Age of Onset  . Rheumatologic disease Mother   . Diabetes Mother   . Heart attack Father   . Rheumatologic disease Sister   . Arthritis Sister        --Rheumatoid arthritis  . Ovarian cancer Sister 52  . Crohn's disease Brother   . Kidney failure Brother   . Ulcerative colitis Brother   . Glaucoma Brother      Past medical history, social, surgical and family history all reviewed in electronic medical record.  No pertanent information unless stated regarding to the chief complaint.   Review of Systems:Review of systems updated and as accurate as of 02/28/17  No headache, visual changes, nausea, vomiting, diarrhea, constipation, dizziness, abdominal pain, skin rash, fevers, chills, night sweats, weight loss, swollen lymph nodes,  chest pain, shortness of breath, mood changes.   Positive muscle aches, joint swelling and body aches  Objective  Blood pressure (!) 142/88, pulse (!) 103, height 5' (1.524 m), weight 238 lb (108 kg), last menstrual period 01/16/2002, SpO2 96 %. Systems examined below as of 02/28/17   General: No apparent distress alert and oriented x3 mood and affect normal, dressed appropriately.  HEENT: Pupils equal, extraocular movements intact  Respiratory: Patient's speak in full sentences and does not appear short of breath  Cardiovascular: No lower extremity edema, non tender, no erythema  Skin: Warm dry intact with no signs of infection or rash on extremities or on axial skeleton.  Abdomen: Soft nontender  Neuro: Cranial nerves II through XII are intact, neurovascularly intact in all extremities with 2+ DTRs and 2+ pulses.  Lymph: No lymphadenopathy of posterior or anterior cervical chain or axillae bilaterally.  Gait antalgic MSK:  Non tender with full range of motion and good stability and symmetric strength and tone of shoulders, elbows, wrist, hip, knee and ankles bilaterally.  Arthritic changes of multiple joints   Back exam shows some mild degenerative scoliosis.  Decreased range of motion in all planes by at least 5 degrees.  Significant decrease in extension.  Unable to do [place] secondary to tightness.  Negative straight leg test bilaterally.  Tenderness diffusely in the paraspinal musculature of the back.  Osteopathic findings C2 flexed rotated and side bent right C4 flexed rotated and side bent left C6 flexed rotated and side bent right  T9 extended rotated and side bent left L2 flexed rotated and side bent left  Sacrum right on right     Impression and Recommendations:     This case required medical decision making of moderate complexity.      Note: This dictation was prepared with Dragon dictation along with smaller phrase technology. Any transcriptional errors that result from this process are unintentional.

## 2017-02-28 ENCOUNTER — Encounter: Payer: Self-pay | Admitting: Family Medicine

## 2017-02-28 ENCOUNTER — Ambulatory Visit: Payer: Medicare Other | Admitting: Family Medicine

## 2017-02-28 ENCOUNTER — Ambulatory Visit (INDEPENDENT_AMBULATORY_CARE_PROVIDER_SITE_OTHER): Payer: Medicare Other | Admitting: Family Medicine

## 2017-02-28 VITALS — BP 142/88 | HR 103 | Ht 60.0 in | Wt 238.0 lb

## 2017-02-28 DIAGNOSIS — M999 Biomechanical lesion, unspecified: Secondary | ICD-10-CM | POA: Diagnosis not present

## 2017-02-28 DIAGNOSIS — G8929 Other chronic pain: Secondary | ICD-10-CM

## 2017-02-28 DIAGNOSIS — M545 Low back pain, unspecified: Secondary | ICD-10-CM

## 2017-02-28 NOTE — Assessment & Plan Note (Signed)
Decision today to treat with OMT was based on Physical Exam  After verbal consent patient was treated with HVLA, ME, FPR techniques in  thoracic, lumbar and sacral areas  Patient tolerated the procedure well with improvement in symptoms  Patient given exercises, stretches and lifestyle modifications  See medications in patient instructions if given  Patient will follow up in 5-6 weeks 

## 2017-02-28 NOTE — Patient Instructions (Signed)
Good ot see you  Overall I am way impressed I will check in and see if anyone does MYK (just sent an email) I would still say no metal in the mouth if you are deciding at some point.  Keep watching everything else See me again in 5 weeks (30 minute appointment please)

## 2017-02-28 NOTE — Assessment & Plan Note (Signed)
Stable at the moment.  Patient has many other comorbidities.  Still having difficulty with all her other difficulties.  Differential still includes seronegative rheumatoid R metal allergy.  Patient is working with significant number of different specialist to see if they can figure anything else out.  Recently though patient has done much better with removing some dental hardware.  Hopefully patient will continue to do well.  Follow-up with me again in 5-6 weeks

## 2017-03-08 DIAGNOSIS — H35373 Puckering of macula, bilateral: Secondary | ICD-10-CM | POA: Diagnosis not present

## 2017-03-08 DIAGNOSIS — H524 Presbyopia: Secondary | ICD-10-CM | POA: Diagnosis not present

## 2017-03-08 DIAGNOSIS — H04123 Dry eye syndrome of bilateral lacrimal glands: Secondary | ICD-10-CM | POA: Diagnosis not present

## 2017-03-08 DIAGNOSIS — H26493 Other secondary cataract, bilateral: Secondary | ICD-10-CM | POA: Diagnosis not present

## 2017-03-09 ENCOUNTER — Encounter: Payer: Self-pay | Admitting: Allergy and Immunology

## 2017-03-16 DIAGNOSIS — H608X3 Other otitis externa, bilateral: Secondary | ICD-10-CM | POA: Diagnosis not present

## 2017-03-16 DIAGNOSIS — L239 Allergic contact dermatitis, unspecified cause: Secondary | ICD-10-CM | POA: Diagnosis not present

## 2017-03-16 DIAGNOSIS — Z0000 Encounter for general adult medical examination without abnormal findings: Secondary | ICD-10-CM | POA: Diagnosis not present

## 2017-03-16 DIAGNOSIS — I10 Essential (primary) hypertension: Secondary | ICD-10-CM | POA: Diagnosis not present

## 2017-03-16 DIAGNOSIS — Z6841 Body Mass Index (BMI) 40.0 and over, adult: Secondary | ICD-10-CM | POA: Diagnosis not present

## 2017-03-23 ENCOUNTER — Encounter: Payer: Self-pay | Admitting: Allergy and Immunology

## 2017-04-03 ENCOUNTER — Encounter: Payer: Self-pay | Admitting: Family Medicine

## 2017-04-04 NOTE — Progress Notes (Signed)
[name] [address] [place], [place] [address] Phone: [phone] Subjective:      CC: Back pain follow-up  MHD:[id number]  Kerry [name] is a 67 y.o. female coming in with complaint of generalized aches and pains. Patient has had last bridge taken off and she states that she has been doing better.  Patient has had significant difficulty before.  Has multiple different arthritic changes of multiple joints.  Seems to have more of a seronegative rheumatoid arthritis.        Past Medical History:  Diagnosis Date  . Abnormal uterine bleeding   . Allergic rhinitis, cause unspecified   . Arthritis of knee, right   . Bursitis of left shoulder   . 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 Chronic pain   . Dyspareunia   . Fibroid   . Foot fracture, left 01/2015   and torn tendons  . GERD (gastroesophageal reflux disease)   . HTN (hypertension)   . Kidney stones   . Sleep apnea   . Urinary incontinence    Past Surgical History:  Procedure Laterality Date  .  dilatation and curettage  2000  . CARPAL TUNNEL RELEASE  1998  . CATARACT EXTRACTION Bilateral   . dental implants  2010  . ESOPHAGEAL DILATION  2010  . HAMMER TOE SURGERY     rt  . PELVIC LAPAROSCOPY  1990  . ROTATOR CUFF REPAIR Right 09/2011   Dr. Tamera Punt  . ROTATOR CUFF REPAIR Left 11/11/13   Dr. Tamera Punt   . SEPTOPLASTY    . UMBILICAL HERNIA REPAIR  2010   Social History   Socioeconomic History  . Marital status: Married    Spouse name: Not on file  . Number of children: Not on file  . Years of education: Not on file  . Highest education level: Not on file  Occupational History  . Occupation: Retired  Scientific laboratory technician  . Financial resource strain: Not on file  . Food insecurity:    Worry: Not on file    Inability: Not on file  . Transportation needs:    Medical: Not on file    Non-medical: Not on file  Tobacco Use  . Smoking status: Never Smoker  . Smokeless tobacco:  Never Used  Substance and Sexual Activity  . Alcohol use: No    Alcohol/week: 0.0 oz  . Drug use: No  . Sexual activity: Never    Partners: Male    Birth control/protection: Post-menopausal  Lifestyle  . Physical activity:    Days per week: Not on file    Minutes per session: Not on file  . Stress: Not on file  Relationships  . Social connections:    Talks on phone: Not on file    Gets together: Not on file    Attends religious service: Not on file    Active member of club or organization: Not on file    Attends meetings of clubs or organizations: Not on file    Relationship status: Not on file  Other Topics Concern  . Not on file  Social History Narrative  . Not on file   Allergies  Allergen Reactions  . Cefixime Swelling  . Celebrex [Celecoxib] Swelling  . Cobalt Other (See Comments)    Per allergist  . Gabapentin Other (See Comments)    Joint pain  . Levofloxacin Other (See Comments)    Muscle and joint pain  . Molybdenum Other (See Comments)  Per allergist  . Other Other (See Comments)    TEQUIN. TEQUIN - JOINT AND MUSCLE PAIN Other Reaction: painful joints TEQUIN. TEQUIN - JOINT AND MUSCLE PAIN Tantal Metal  . Penicillins Hives  . Robaxin [Methocarbamol]   . Tomato Hives  . Adhesive [Tape] Rash    Sensitive to EKG leads  . Tetracycline Hcl Rash    Can take Doxy  . Tetracyclines & Related Rash   Family History  Problem Relation Age of Onset  . Rheumatologic disease Mother   . Diabetes Mother   . Heart attack Father   . Rheumatologic disease Sister   . Arthritis Sister        --Rheumatoid arthritis  . Ovarian cancer Sister 68  . Crohn's disease Brother   . Kidney failure Brother   . Ulcerative colitis Brother   . Glaucoma Brother      Past medical history, social, surgical and family history all reviewed in electronic medical record.  No pertanent information unless stated regarding to the chief complaint.   Review of Systems:Review of  systems updated and as accurate as of 04/05/17  No headache, visual changes, nausea, vomiting, diarrhea, constipation, dizziness, abdominal pain, skin rash, fevers, chills, night sweats, weight loss, swollen lymph nodes, chest pain, shortness of breath, mood changes.  Positive muscle aches and body aches  Objective  Blood pressure (!) 168/88, pulse (!) 113, height 5' (1.524 m), weight 238 lb (108 kg), last menstrual period 01/16/2002, SpO2 98 %. Systems examined below as of 04/05/17   General: No apparent distress alert and oriented x3 mood and affect normal, dressed appropriately.  HEENT: Pupils equal, extraocular movements intact  Respiratory: Patient's speak in full sentences and does not appear short of breath  Cardiovascular: No lower extremity edema, non tender, no erythema  Skin: Warm dry intact with no signs of infection or rash on extremities or on axial skeleton.  Abdomen: Soft nontender  Neuro: Cranial nerves II through XII are intact, neurovascularly intact in all extremities with 2+ DTRs and 2+ pulses.  Lymph: No lymphadenopathy of posterior or anterior cervical chain or axillae bilaterally.  Gait antalgic gait MSK: Mild tender with mild limited range of motion and good stability and symmetric strength and tone of shoulders, elbows, wrist, hip, knee and ankles bilaterally.  Back exam shows the patient does have tightness in all planes.  Unable to do the [facility] test secondary to the amount of tightness.  Negative straight leg test but significant tightness of the hamstrings bilaterally.  Tenderness to palpation of the paraspinal musculature.  Neurovascularly intact distally.  Osteopathic findings C6 flexed rotated and side bent left T3 extended rotated and side bent right inhaled third rib T11 extended rotated and side bent left L2 flexed rotated and side bent right Sacrum right on right     Impression and Recommendations:     This case required medical decision making of  moderate complexity.      Note: This dictation was prepared with Dragon dictation along with smaller phrase technology. Any transcriptional errors that result from this process are unintentional.

## 2017-04-05 ENCOUNTER — Encounter: Payer: Self-pay | Admitting: Family Medicine

## 2017-04-05 ENCOUNTER — Ambulatory Visit (INDEPENDENT_AMBULATORY_CARE_PROVIDER_SITE_OTHER): Payer: Medicare Other | Admitting: Family Medicine

## 2017-04-05 VITALS — BP 168/88 | HR 113 | Ht 60.0 in | Wt 238.0 lb

## 2017-04-05 DIAGNOSIS — M999 Biomechanical lesion, unspecified: Secondary | ICD-10-CM

## 2017-04-05 DIAGNOSIS — G8929 Other chronic pain: Secondary | ICD-10-CM

## 2017-04-05 DIAGNOSIS — M545 Low back pain, unspecified: Secondary | ICD-10-CM

## 2017-04-05 MED ORDER — B-12 1000 MCG/ML IJ KIT: 1.0000 | PACK | INTRAMUSCULAR | 3 refills | Status: DC

## 2017-04-05 NOTE — Patient Instructions (Signed)
Good to see you  Kerry [name] is your friend.  Stay active.  I would take the medicine  Mebendazole then consider 100mg  twice a day  If another meidcine please write me  Refilled the B12  See me again in 5 weeks

## 2017-04-05 NOTE — Assessment & Plan Note (Signed)
I believe still having some type of potential systemic illness that we cannot possibly diagnosed at the moment.  Seronegative rheumatoid arthritis is a possibility.  Discussed icing regimen and home exercises.  Discussed which activities doing which wants to avoid.  Increase activity as tolerated over the course the next several days and weeks.  Follow-up with me again in 4-6 weeks.

## 2017-04-05 NOTE — Assessment & Plan Note (Signed)
Decision today to treat with OMT was based on Physical Exam  After verbal consent patient was treated with HVLA, ME, FPR techniques in cervical, thoracic, lumbar and sacral areas  Patient tolerated the procedure well with improvement in symptoms  Patient given exercises, stretches and lifestyle modifications  See medications in patient instructions if given  Patient will follow up in 4-6 weeks 

## 2017-04-06 ENCOUNTER — Encounter: Payer: Self-pay | Admitting: Family Medicine

## 2017-04-13 IMAGING — CR DG FOOT COMPLETE 3+V*L*
3 series · 3 of 3 positions shown · non-contrast
Comparison: None.

CLINICAL DATA: Left foot pain for 1 and half months, with palpable
lump on the proximal anterior aspect of the foot.

EXAM:
LEFT FOOT - COMPLETE 3+ VIEW

[view not recorded (1 of 3)]
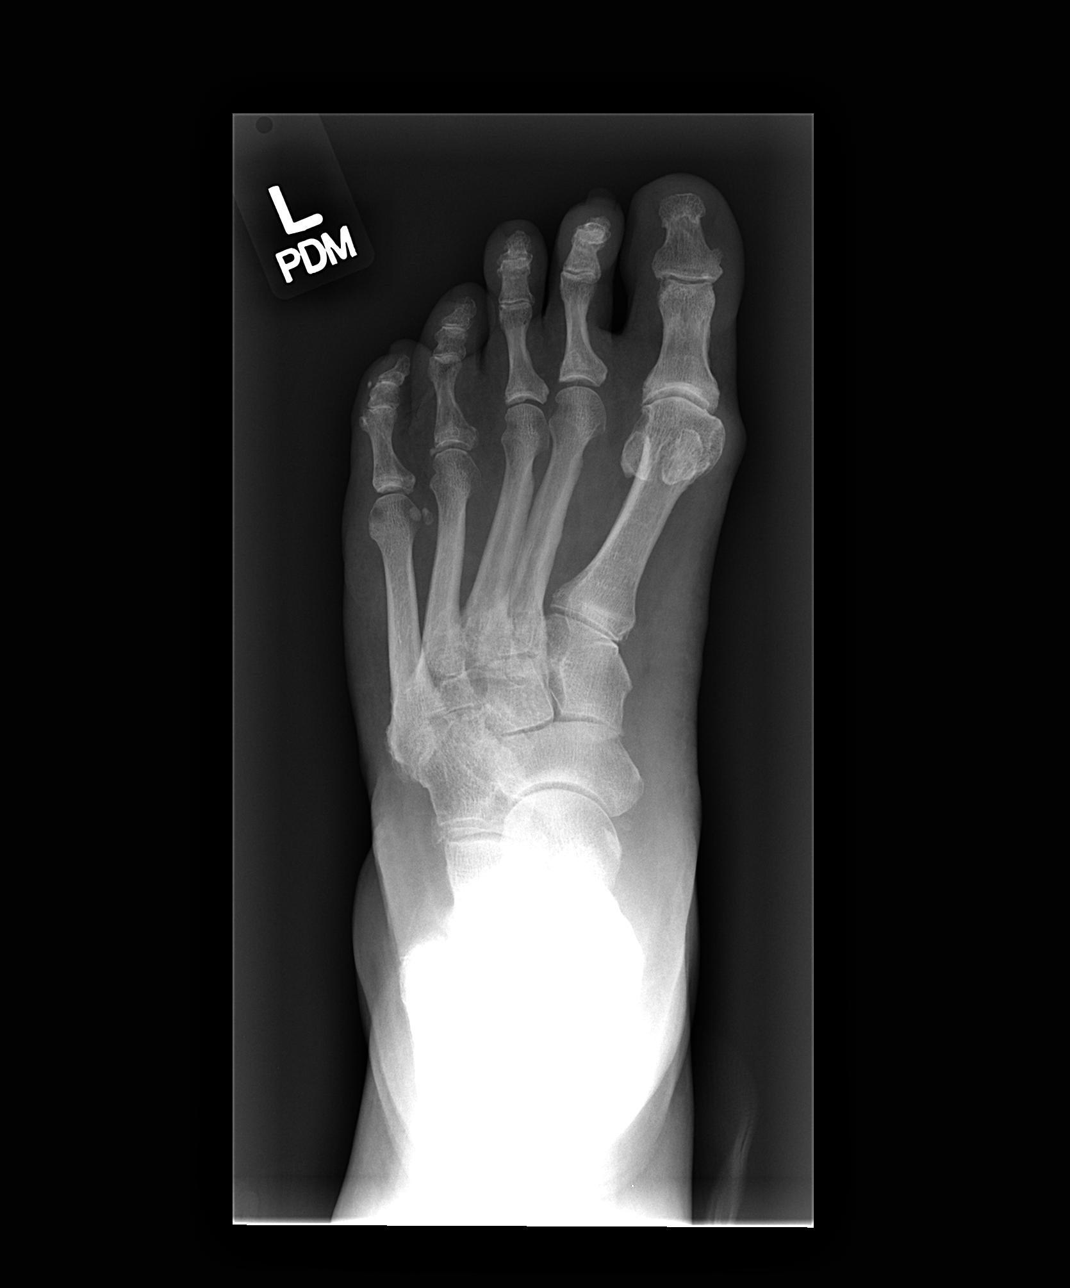

[view not recorded (2 of 3)]
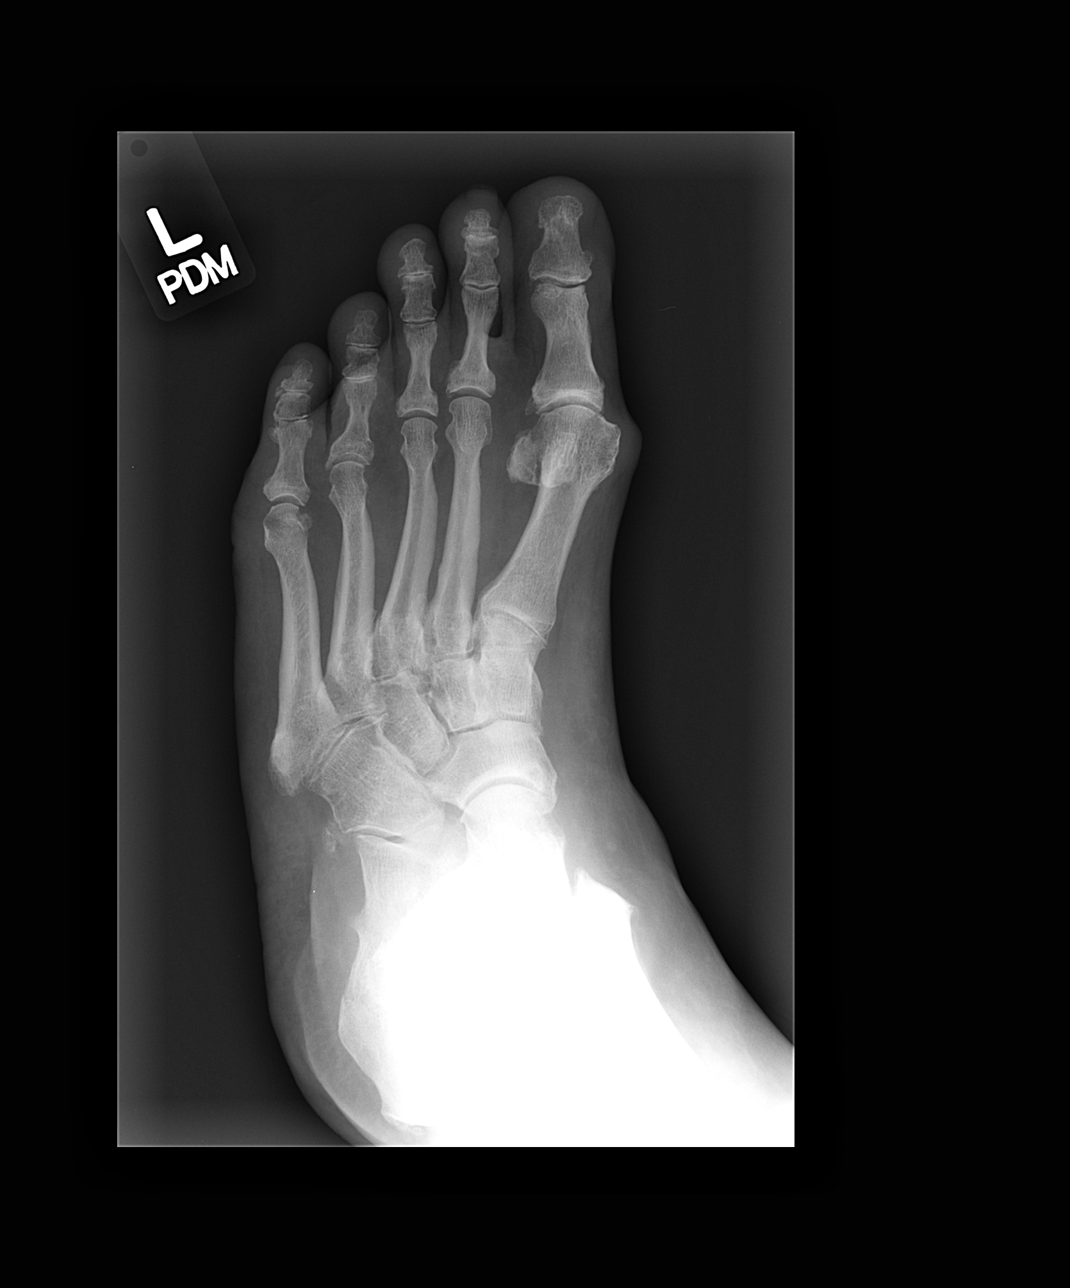

[view not recorded (3 of 3)]
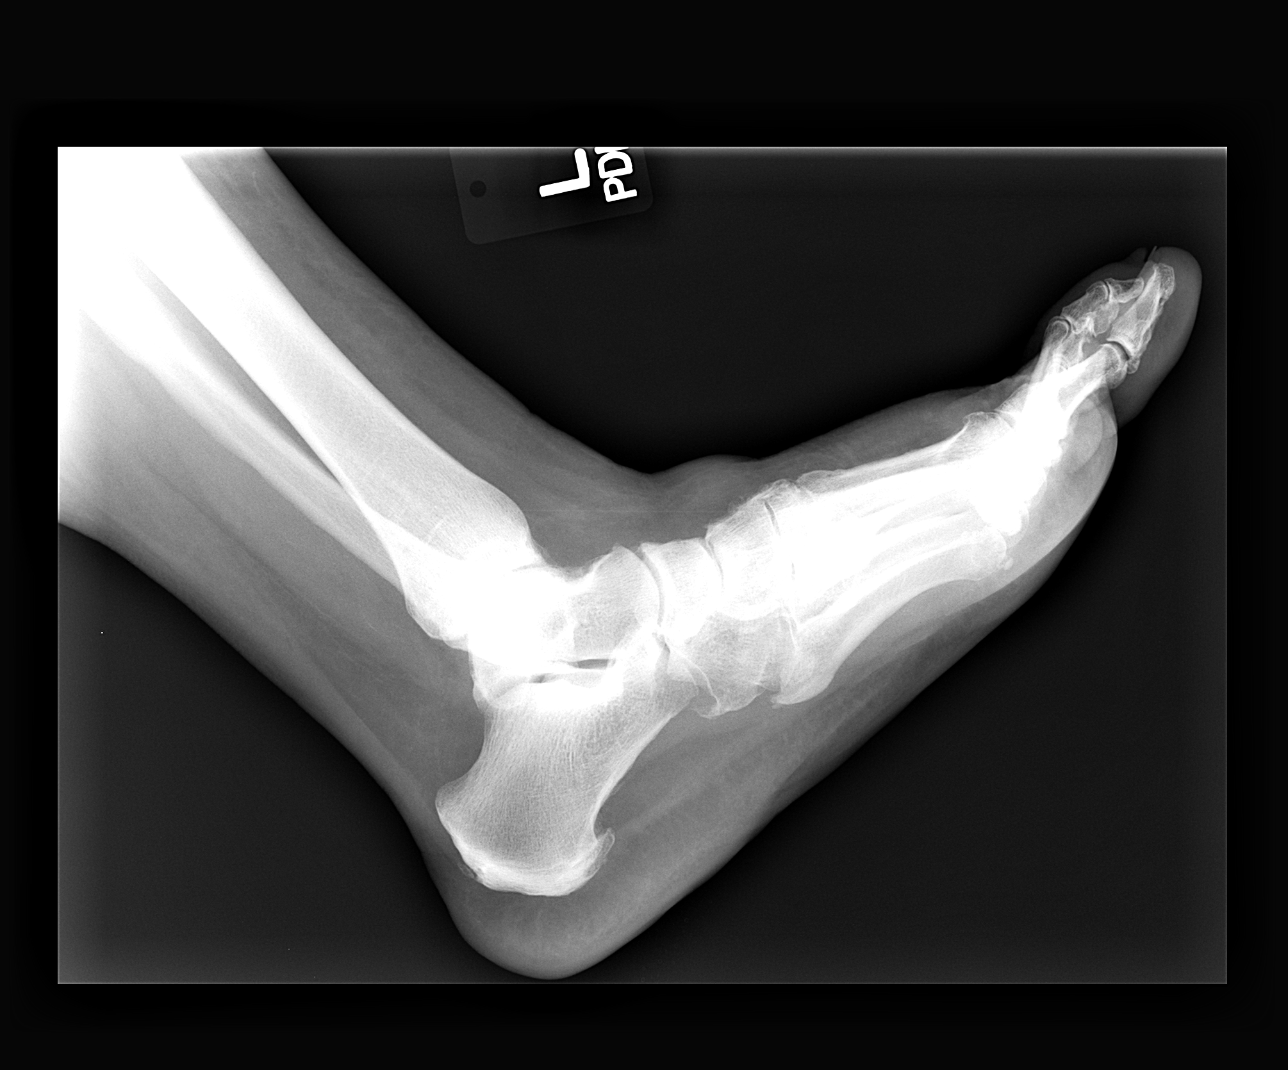

[3 of 3 positions shown; findings below may reference images not displayed]

FINDINGS: There is no evidence of fracture or dislocation. There are
multilevel osteoarthritic changes with joint space narrowing,
osteophytosis, subchondral sclerosis and small erosions involving
all metatarsophalangeal, and tarsometatarsal joints. Plantar
osteophytosis of the calcaneus is also noted. There is mild soft
tissue swelling anteriorly.
IMPRESSION: Diffuse osteoarthritic changes.

No evidence of fracture or dislocation.

## 2017-05-10 ENCOUNTER — Encounter: Payer: Self-pay | Admitting: Family Medicine

## 2017-05-10 ENCOUNTER — Ambulatory Visit (INDEPENDENT_AMBULATORY_CARE_PROVIDER_SITE_OTHER): Payer: Medicare Other | Admitting: Family Medicine

## 2017-05-10 VITALS — BP 142/72 | HR 104 | Ht 60.0 in | Wt 237.0 lb

## 2017-05-10 DIAGNOSIS — G894 Chronic pain syndrome: Secondary | ICD-10-CM

## 2017-05-10 DIAGNOSIS — M999 Biomechanical lesion, unspecified: Secondary | ICD-10-CM

## 2017-05-10 NOTE — Assessment & Plan Note (Signed)
Decision today to treat with OMT was based on Physical Exam  After verbal consent patient was treated with HVLA, ME, FPR techniques in cervical, thoracic, lumbar and sacral areas  Patient tolerated the procedure well with improvement in symptoms  Patient given exercises, stretches and lifestyle modifications  See medications in patient instructions if given  Patient will follow up in 4-8 weeks 

## 2017-05-10 NOTE — Patient Instructions (Signed)
Good to see you  I am sorry no good answer If you get the name I will refer you  Stay active if you can  I hope you feel better soon  Read about plaquenil and consider it  See me again in 4 weeks (30 minute appointment)

## 2017-05-10 NOTE — Assessment & Plan Note (Signed)
Continues to have chronic pain.  Very difficult to assess in great detail at this moment.  Still think that there is a likelihood of his seronegative rheumatoid arthritis.  Discussed with patient in great length.  Discussed icing regimen and home exercises.  Discussed which activities of doing which wants to avoid.  Patient is to increase activity slowly over the course the next several days.  Patient will consider the use of Plaquenil.  Follow-up again in 4 to 8 weeks

## 2017-05-10 NOTE — Progress Notes (Signed)
[name] [address] [place], [place] [address] Phone: [phone] Subjective:    I'm seeing this patient by the request  of:    CC: Pain all over follow-up  [id number]  Kerry [name] is a 67 y.o. female coming in with complaint of back pain.  Patient has been seen numerous times.  Has had this difficulty for quite some time.  Patient has tried many different modalities but has had difficulties secondary to many allergies and some of patient being noncompliant.  Patient has seen multiple different providers including dentist at this moment.  Patient is also having difficulty with her cataract surgery that she had.  Has lost most of her peripheral vision she states.  Wondering if she can be referred to another surgeon.  Patient continues to have pain on a daily basis.  We attempted to treat patient for her eosinophilia with albendazole but did not notice significant improvement.    Past Medical History:  Diagnosis Date  . Abnormal uterine bleeding   . Allergic rhinitis, cause unspecified   . Arthritis of knee, right   . Bursitis of left shoulder   . 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 Chronic pain   . Dyspareunia   . Fibroid   . Foot fracture, left 01/2015   and torn tendons  . GERD (gastroesophageal reflux disease)   . HTN (hypertension)   . Kidney stones   . Sleep apnea   . Urinary incontinence    Past Surgical History:  Procedure Laterality Date  .  dilatation and curettage  2000  . CARPAL TUNNEL RELEASE  1998  . CATARACT EXTRACTION Bilateral   . dental implants  2010  . ESOPHAGEAL DILATION  2010  . HAMMER TOE SURGERY     rt  . PELVIC LAPAROSCOPY  1990  . ROTATOR CUFF REPAIR Right 09/2011   Dr. Tamera Punt  . ROTATOR CUFF REPAIR Left 11/11/13   Dr. Tamera Punt   . SEPTOPLASTY    . UMBILICAL HERNIA REPAIR  2010   Social History   Socioeconomic History  . Marital status: Married    Spouse name: Not on file  . Number of children:  Not on file  . Years of education: Not on file  . Highest education level: Not on file  Occupational History  . Occupation: Retired  Scientific laboratory technician  . Financial resource strain: Not on file  . Food insecurity:    Worry: Not on file    Inability: Not on file  . Transportation needs:    Medical: Not on file    Non-medical: Not on file  Tobacco Use  . Smoking status: Never Smoker  . Smokeless tobacco: Never Used  Substance and Sexual Activity  . Alcohol use: No    Alcohol/week: 0.0 oz  . Drug use: No  . Sexual activity: Never    Partners: Male    Birth control/protection: Post-menopausal  Lifestyle  . Physical activity:    Days per week: Not on file    Minutes per session: Not on file  . Stress: Not on file  Relationships  . Social connections:    Talks on phone: Not on file    Gets together: Not on file    Attends religious service: Not on file    Active member of club or organization: Not on file    Attends meetings of clubs or organizations: Not on file    Relationship status: Not on file  Other  Topics Concern  . Not on file  Social History Narrative  . Not on file   Allergies  Allergen Reactions  . Cefixime Swelling  . Celebrex [Celecoxib] Swelling  . Cobalt Other (See Comments)    Per allergist  . Gabapentin Other (See Comments)    Joint pain  . Levofloxacin Other (See Comments)    Muscle and joint pain  . Molybdenum Other (See Comments)    Per allergist  . Other Other (See Comments)    TEQUIN. TEQUIN - JOINT AND MUSCLE PAIN Other Reaction: painful joints TEQUIN. TEQUIN - JOINT AND MUSCLE PAIN Tantal Metal  . Penicillins Hives  . Robaxin [Methocarbamol]   . Tomato Hives  . Adhesive [Tape] Rash    Sensitive to EKG leads  . Tetracycline Hcl Rash    Can take Doxy  . Tetracyclines & Related Rash   Family History  Problem Relation Age of Onset  . Rheumatologic disease Mother   . Diabetes Mother   . Heart attack Father   . Rheumatologic disease Sister    . Arthritis Sister        --Rheumatoid arthritis  . Ovarian cancer Sister 28  . Crohn's disease Brother   . Kidney failure Brother   . Ulcerative colitis Brother   . Glaucoma Brother      Past medical history, social, surgical and family history all reviewed in electronic medical record.  No pertanent information unless stated regarding to the chief complaint.   Review of Systems:Review of systems updated and as accurate as of 05/10/17  No headache, visual changes, nausea, vomiting, diarrhea, constipation, dizziness, abdominal pain, skin rash, fevers, chills, night sweats, weight loss, swollen lymph nodes, , chest pain, shortness of breath, mood changes.  Positive joint swelling, muscle aches, body aches  Objective  Blood pressure (!) 142/72, pulse (!) 104, height 5' (1.524 m), weight 237 lb (107.5 kg), last menstrual period 01/16/2002, SpO2 97 %. Systems examined below as of 05/10/17   General: No apparent distress alert and oriented x3 mood and affect normal, dressed appropriately.  HEENT: Pupils equal, extraocular movements intact  Respiratory: Patient's speak in full sentences and does not appear short of breath  Cardiovascular: Trace lower extremity edema, non tender, no erythema  Skin: Warm dry intact with no signs of infection or rash on extremities or on axial skeleton.  Abdomen: Soft nontender  Neuro: Cranial nerves II through XII are intact, neurovascularly intact in all extremities with 2+ DTRs and 2+ pulses.  Lymph: No lymphadenopathy of posterior or anterior cervical chain or axillae bilaterally.  Gait severely antalgic today worse than usual MSK:  tender with moderate limitation in multiple planes range of motion and good stability and symmetric strength and tone of shoulders, elbows, wrist, hip, and ankles bilaterally.  Knee: Bilateral valgus deformity noted. Large thigh to calf ratio.  Tender to palpation over medial and PF joint line.  ROM full in flexion and  extension and lower leg rotation. instability with valgus force.  painful patellar compression. Patellar glide with moderate crepitus. Patellar and quadriceps tendons unremarkable. Hamstring and quadriceps strength is normal.  Back exam shows loss of lordosis with some degenerative scoliosis.  Negative straight leg test.  Positive Faber test bilaterally unable to do [place] secondary to tightness.  More tightness in the thoracolumbar juncture as well as lumbosacral area bilaterally.  Neurovascular intact distally.  Osteopathic findings C2 flexed rotated and side bent right C4 flexed rotated and side bent left C6 flexed rotated and side  bent left T3 extended rotated and side bent right inhaled third rib T9 extended rotated and side bent left L3 flexed rotated and side bent left  Sacrum right on right    Impression and Recommendations:     This case required medical decision making of moderate complexity.      Note: This dictation was prepared with Dragon dictation along with smaller phrase technology. Any transcriptional errors that result from this process are unintentional.

## 2017-05-14 ENCOUNTER — Encounter: Payer: Medicare Other | Admitting: Allergy and Immunology

## 2017-05-17 ENCOUNTER — Encounter: Payer: Medicare Other | Admitting: Allergy and Immunology

## 2017-05-21 ENCOUNTER — Ambulatory Visit (INDEPENDENT_AMBULATORY_CARE_PROVIDER_SITE_OTHER): Payer: Medicare Other | Admitting: Allergy and Immunology

## 2017-05-21 ENCOUNTER — Encounter: Payer: Self-pay | Admitting: Allergy and Immunology

## 2017-05-21 VITALS — BP 158/82 | HR 88 | Resp 16

## 2017-05-21 DIAGNOSIS — L230 Allergic contact dermatitis due to metals: Secondary | ICD-10-CM

## 2017-05-22 ENCOUNTER — Encounter: Payer: Self-pay | Admitting: Allergy and Immunology

## 2017-05-22 NOTE — Progress Notes (Signed)
Kerry [name] returns to this clinic to have further medical testing performed.  Patch testing directed against aluminum, titanium, and vanadium were completed and she will return to this clinic in 48 hours for initial read and subsequently a 7-day reviewed.

## 2017-05-23 ENCOUNTER — Ambulatory Visit: Payer: Medicare Other | Admitting: Allergy and Immunology

## 2017-05-23 DIAGNOSIS — L230 Allergic contact dermatitis due to metals: Secondary | ICD-10-CM

## 2017-05-24 ENCOUNTER — Encounter: Payer: Self-pay | Admitting: Allergy and Immunology

## 2017-05-24 NOTE — Progress Notes (Signed)
Kerry [name] returns to this clinic for her 48-hour patch test read.  She did not demonstrate any hypersensitivity to aluminum hydroxide and vanadium pentoxide.  She will return to this clinic next week to have a 7-day read.

## 2017-05-28 ENCOUNTER — Ambulatory Visit: Payer: Medicare Other | Admitting: Allergy and Immunology

## 2017-05-28 DIAGNOSIS — L230 Allergic contact dermatitis due to metals: Secondary | ICD-10-CM

## 2017-05-28 NOTE — Progress Notes (Signed)
Kerry [name] returns to this clinic to have a 7-day patch read performed directed against aluminum hydroxide and vanadium.  Examination of her allergen patch test sites did not reveal any reactivity.

## 2017-05-29 ENCOUNTER — Encounter: Payer: Self-pay | Admitting: Allergy and Immunology

## 2017-06-07 NOTE — Progress Notes (Signed)
[name] [address] [place], [place] [address] Phone: [phone] Subjective:     CC: Chronic polyarthralgia  IRS:[id number]  Kerry [name] is a 67 y.o. female coming in with complaint of back pain. She complains of some residual symptoms from a virus that she had last visit.  Patient continues to have pain on a daily basis.  States that the fatigue seems to be worsening at this point.  Patient feels like she is progressing entirely.  Still trying to figure out the possible removal of implants for her teeth.  Thinking that could be contributing to symptoms however aches and pains.  Patient tripped over her shoes and landed on her knee. Her pain has improved since earlier this week. Soreness instead of pain today.  Patient states that it side because now has pain in one area and states that the chronic pain in the knee seems to be better.     Past Medical History:  Diagnosis Date  . Abnormal uterine bleeding   . Allergic rhinitis, cause unspecified   . Arthritis of knee, right   . Bursitis of left shoulder   . 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 Chronic pain   . Dyspareunia   . Fibroid   . Foot fracture, left 01/2015   and torn tendons  . GERD (gastroesophageal reflux disease)   . HTN (hypertension)   . Kidney stones   . Sleep apnea   . Urinary incontinence    Past Surgical History:  Procedure Laterality Date  .  dilatation and curettage  2000  . CARPAL TUNNEL RELEASE  1998  . CATARACT EXTRACTION Bilateral   . dental implants  2010  . ESOPHAGEAL DILATION  2010  . HAMMER TOE SURGERY     rt  . PELVIC LAPAROSCOPY  1990  . ROTATOR CUFF REPAIR Right 09/2011   Dr. Tamera Punt  . ROTATOR CUFF REPAIR Left 11/11/13   Dr. Tamera Punt   . SEPTOPLASTY    . UMBILICAL HERNIA REPAIR  2010   Social History   Socioeconomic History  . Marital status: Married    Spouse name: Not on file  . Number of children: Not on file  . Years of education: Not  on file  . Highest education level: Not on file  Occupational History  . Occupation: Retired  Scientific laboratory technician  . Financial resource strain: Not on file  . Food insecurity:    Worry: Not on file    Inability: Not on file  . Transportation needs:    Medical: Not on file    Non-medical: Not on file  Tobacco Use  . Smoking status: Never Smoker  . Smokeless tobacco: Never Used  Substance and Sexual Activity  . Alcohol use: No    Alcohol/week: 0.0 oz  . Drug use: No  . Sexual activity: Never    Partners: Male    Birth control/protection: Post-menopausal  Lifestyle  . Physical activity:    Days per week: Not on file    Minutes per session: Not on file  . Stress: Not on file  Relationships  . Social connections:    Talks on phone: Not on file    Gets together: Not on file    Attends religious service: Not on file    Active member of club or organization: Not on file    Attends meetings of clubs or organizations: Not on file    Relationship status: Not on file  Other Topics  Concern  . Not on file  Social History Narrative  . Not on file   Allergies  Allergen Reactions  . Cefixime Swelling  . Celebrex [Celecoxib] Swelling  . Cobalt Other (See Comments)    Per allergist  . Gabapentin Other (See Comments)    Joint pain  . Levofloxacin Other (See Comments)    Muscle and joint pain  . Molybdenum Other (See Comments)    Per allergist  . Other Other (See Comments)    TEQUIN. TEQUIN - JOINT AND MUSCLE PAIN Other Reaction: painful joints TEQUIN. TEQUIN - JOINT AND MUSCLE PAIN Tantal Metal  . Penicillins Hives  . Robaxin [Methocarbamol]   . Tomato Hives  . Adhesive [Tape] Rash    Sensitive to EKG leads; sensitive to 5M Micropore tape  . Tetracycline Hcl Rash    Can take Doxy  . Tetracyclines & Related Rash   Family History  Problem Relation Age of Onset  . Rheumatologic disease Mother   . Diabetes Mother   . Heart attack Father   . Rheumatologic disease Sister   .  Arthritis Sister        --Rheumatoid arthritis  . Ovarian cancer Sister 70  . Crohn's disease Brother   . Kidney failure Brother   . Ulcerative colitis Brother   . Glaucoma Brother      Past medical history, social, surgical and family history all reviewed in electronic medical record.  No pertanent information unless stated regarding to the chief complaint.   Review of Systems:Review of systems updated and as accurate as of 06/08/17  No headache, visual changes, nausea, vomiting, diarrhea, constipation, dizziness, abdominal pain, skin rash, fevers, chills, night sweats, weight loss, swollen lymph nodes, , chest pain, shortness of breath, mood changes.  Positive body aches, joint swelling and muscle aches  Objective  Blood pressure (!) 158/90, pulse (!) 104, height 5' (1.524 m), weight 241 lb (109.3 kg), last menstrual period 01/16/2002, SpO2 97 %. Systems examined below as of 06/08/17   General: No apparent distress alert and oriented x3 mood and affect normal, dressed appropriately.  HEENT: Pupils equal, extraocular movements intact  Respiratory: Patient's speak in full sentences and does not appear short of breath  Cardiovascular: Trace lower extremity edema, non tender, no erythema  Skin: Warm dry intact with no signs of infection or rash on extremities or on axial skeleton.  Abdomen: Soft nontender  Neuro: Cranial nerves II through XII are intact, neurovascularly intact in all extremities with 2+ DTRs and 2+ pulses.  Lymph: No lymphadenopathy of posterior or anterior cervical chain or axillae bilaterally.  Gait antalgic MSK:  tender with limited range of motion and good stability and symmetric strength and tone of shoulders, elbows, wrist, hip, and ankles bilaterally.  Knee: Bilateral valgus deformity noted. Large thigh to calf ratio.  Tender to palpation over medial and PF joint line.  ROM full in flexion and extension and lower leg rotation. instability with valgus force.    painful patellar compression. Patellar glide with moderate crepitus. Patellar and quadriceps tendons unremarkable. Hamstring and quadriceps strength is normal.   Back examination significant loss of lordosis and degenerative scoliosis.  Tightness in all planes.  Unable to do [place] secondary to tightness.  Negative straight leg test.  Osteopathic findings C4 flexed rotated and side bent left C6 flexed rotated and side bent left T3 extended rotated and side bent right inhaled third rib T9 extended rotated and side bent left L2 flexed rotated and side bent right  Sacrum right on right     Impression and Recommendations:     This case required medical decision making of moderate complexity.      Note: This dictation was prepared with Dragon dictation along with smaller phrase technology. Any transcriptional errors that result from this process are unintentional.

## 2017-06-08 ENCOUNTER — Ambulatory Visit (INDEPENDENT_AMBULATORY_CARE_PROVIDER_SITE_OTHER): Payer: Medicare Other | Admitting: Family Medicine

## 2017-06-08 ENCOUNTER — Encounter: Payer: Self-pay | Admitting: Family Medicine

## 2017-06-08 VITALS — BP 158/90 | HR 104 | Ht 60.0 in | Wt 241.0 lb

## 2017-06-08 DIAGNOSIS — R635 Abnormal weight gain: Secondary | ICD-10-CM | POA: Diagnosis not present

## 2017-06-08 DIAGNOSIS — M109 Gout, unspecified: Secondary | ICD-10-CM

## 2017-06-08 DIAGNOSIS — M2550 Pain in unspecified joint: Secondary | ICD-10-CM

## 2017-06-08 DIAGNOSIS — R700 Elevated erythrocyte sedimentation rate: Secondary | ICD-10-CM

## 2017-06-08 DIAGNOSIS — R5383 Other fatigue: Secondary | ICD-10-CM | POA: Diagnosis not present

## 2017-06-08 DIAGNOSIS — R634 Abnormal weight loss: Secondary | ICD-10-CM | POA: Diagnosis not present

## 2017-06-08 DIAGNOSIS — M999 Biomechanical lesion, unspecified: Secondary | ICD-10-CM

## 2017-06-08 DIAGNOSIS — E538 Deficiency of other specified B group vitamins: Secondary | ICD-10-CM | POA: Diagnosis not present

## 2017-06-08 DIAGNOSIS — M791 Myalgia, unspecified site: Secondary | ICD-10-CM

## 2017-06-08 DIAGNOSIS — M899 Disorder of bone, unspecified: Secondary | ICD-10-CM

## 2017-06-08 NOTE — Patient Instructions (Signed)
Good to see you as always Lets try the inhaler daily for next 2 weeks Uloric daily for 1 week.  We will get labs next week and I will have them in the computer.  I  Am sorry such a slow process.  See me again 4 weeks

## 2017-06-08 NOTE — Assessment & Plan Note (Signed)
Decision today to treat with OMT was based on Physical Exam  After verbal consent patient was treated with HVLA, ME, FPR techniques in cervical, thoracic, lumbar and sacral areas  Patient tolerated the procedure well with improvement in symptoms  Patient given exercises, stretches and lifestyle modifications  See medications in patient instructions if given  Patient will follow up in 4 weeks 

## 2017-06-08 NOTE — Assessment & Plan Note (Signed)
Patient has uric acid arthropathy and multiple other problems.  Continues to have differential that includes a seronegative rheumatoid arthritis.  Discussed icing regimen.  Discussed which activities to do which wants to avoid.  Increase activity as tolerated.  Patient continues to have difficulty overall.  Discussed laboratory work-up for further evaluation for inflammation.  Beautiful did osteopathic manipulation again.  Follow-up in 4 weeks

## 2017-06-12 ENCOUNTER — Encounter: Payer: Self-pay | Admitting: Family Medicine

## 2017-06-13 ENCOUNTER — Other Ambulatory Visit (INDEPENDENT_AMBULATORY_CARE_PROVIDER_SITE_OTHER): Payer: Medicare Other

## 2017-06-13 DIAGNOSIS — M2550 Pain in unspecified joint: Secondary | ICD-10-CM

## 2017-06-13 DIAGNOSIS — E538 Deficiency of other specified B group vitamins: Secondary | ICD-10-CM

## 2017-06-13 LAB — VITAMIN B12: VITAMIN B 12: 619 pg/mL (ref 211–911)

## 2017-06-13 LAB — URIC ACID: Uric Acid, Serum: 7.7 mg/dL — ABNORMAL HIGH (ref 2.4–7.0)

## 2017-06-13 LAB — SEDIMENTATION RATE: Sed Rate: 40 mm/hr — ABNORMAL HIGH (ref 0–30)

## 2017-06-14 LAB — PTH, INTACT AND CALCIUM
Calcium: 9.8 mg/dL (ref 8.6–10.4)
PTH: 31 pg/mL (ref 14–64)

## 2017-06-21 DIAGNOSIS — D225 Melanocytic nevi of trunk: Secondary | ICD-10-CM | POA: Diagnosis not present

## 2017-06-21 DIAGNOSIS — L814 Other melanin hyperpigmentation: Secondary | ICD-10-CM | POA: Diagnosis not present

## 2017-06-21 DIAGNOSIS — L821 Other seborrheic keratosis: Secondary | ICD-10-CM | POA: Diagnosis not present

## 2017-06-21 DIAGNOSIS — L918 Other hypertrophic disorders of the skin: Secondary | ICD-10-CM | POA: Diagnosis not present

## 2017-06-21 DIAGNOSIS — D1801 Hemangioma of skin and subcutaneous tissue: Secondary | ICD-10-CM | POA: Diagnosis not present

## 2017-06-21 DIAGNOSIS — L308 Other specified dermatitis: Secondary | ICD-10-CM | POA: Diagnosis not present

## 2017-06-26 ENCOUNTER — Other Ambulatory Visit: Payer: Self-pay | Admitting: Obstetrics and Gynecology

## 2017-06-26 DIAGNOSIS — Z1231 Encounter for screening mammogram for malignant neoplasm of breast: Secondary | ICD-10-CM

## 2017-06-27 DIAGNOSIS — J329 Chronic sinusitis, unspecified: Secondary | ICD-10-CM | POA: Diagnosis not present

## 2017-06-27 DIAGNOSIS — E030 Congenital hypothyroidism with diffuse goiter: Secondary | ICD-10-CM | POA: Diagnosis not present

## 2017-06-27 DIAGNOSIS — K056 Periodontal disease, unspecified: Secondary | ICD-10-CM | POA: Diagnosis not present

## 2017-06-27 DIAGNOSIS — E785 Hyperlipidemia, unspecified: Secondary | ICD-10-CM | POA: Diagnosis not present

## 2017-06-27 DIAGNOSIS — D550 Anemia due to glucose-6-phosphate dehydrogenase [G6PD] deficiency: Secondary | ICD-10-CM | POA: Diagnosis not present

## 2017-06-27 DIAGNOSIS — T561X4A Toxic effect of mercury and its compounds, undetermined, initial encounter: Secondary | ICD-10-CM | POA: Diagnosis not present

## 2017-06-27 DIAGNOSIS — Z79899 Other long term (current) drug therapy: Secondary | ICD-10-CM | POA: Diagnosis not present

## 2017-07-04 NOTE — Progress Notes (Signed)
[name] [address], [place] [address] Phone: [phone] Subjective:     CC: Polyarthralgia follow-up  [id number]  Kerry [name] is a 67 y.o. female coming in with complaint of multiple joint pains.  Patient continues to have difficulty with her teeth and is trying to figure out what can be possibly done.  Most recent work-up showed a little bit of a B12 deficiency and elevated uric acid again.  Patient is encouraged and will likely start allopurinol.  Was responding them previously.  Patient continues to have aches and pains overall.  Working diagnosis has been more of a seronegative rheumatoid arthritis that patient has not been wanting to take the medication on a regular basis     Past Medical History:  Diagnosis Date  . Abnormal uterine bleeding   . Allergic rhinitis, cause unspecified   . Arthritis of knee, right   . Bursitis of left shoulder   . 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 Chronic pain   . Dyspareunia   . Fibroid   . Foot fracture, left 01/2015   and torn tendons  . GERD (gastroesophageal reflux disease)   . HTN (hypertension)   . Kidney stones   . Sleep apnea   . Urinary incontinence    Past Surgical History:  Procedure Laterality Date  .  dilatation and curettage  2000  . CARPAL TUNNEL RELEASE  1998  . CATARACT EXTRACTION Bilateral   . dental implants  2010  . ESOPHAGEAL DILATION  2010  . HAMMER TOE SURGERY     rt  . PELVIC LAPAROSCOPY  1990  . ROTATOR CUFF REPAIR Right 09/2011   Dr. Tamera Punt  . ROTATOR CUFF REPAIR Left 11/11/13   Dr. Tamera Punt   . SEPTOPLASTY    . UMBILICAL HERNIA REPAIR  2010   Social History   Socioeconomic History  . Marital status: Married    Spouse name: Not on file  . Number of children: Not on file  . Years of education: Not on file  . Highest education level: Not on file  Occupational History  . Occupation: Retired  Scientific laboratory technician  . Financial resource strain: Not on  file  . Food insecurity:    Worry: Not on file    Inability: Not on file  . Transportation needs:    Medical: Not on file    Non-medical: Not on file  Tobacco Use  . Smoking status: Never Smoker  . Smokeless tobacco: Never Used  Substance and Sexual Activity  . Alcohol use: No    Alcohol/week: 0.0 oz  . Drug use: No  . Sexual activity: Never    Partners: Male    Birth control/protection: Post-menopausal  Lifestyle  . Physical activity:    Days per week: Not on file    Minutes per session: Not on file  . Stress: Not on file  Relationships  . Social connections:    Talks on phone: Not on file    Gets together: Not on file    Attends religious service: Not on file    Active member of club or organization: Not on file    Attends meetings of clubs or organizations: Not on file    Relationship status: Not on file  Other Topics Concern  . Not on file  Social History Narrative  . Not on file   Allergies  Allergen Reactions  . Cefixime Swelling  . Celebrex [Celecoxib] Swelling  . Cobalt Other (  See Comments)    Per allergist  . Gabapentin Other (See Comments)    Joint pain  . Levofloxacin Other (See Comments)    Muscle and joint pain  . Molybdenum Other (See Comments)    Per allergist  . Other Other (See Comments)    TEQUIN. TEQUIN - JOINT AND MUSCLE PAIN Other Reaction: painful joints TEQUIN. TEQUIN - JOINT AND MUSCLE PAIN Tantal Metal  . Penicillins Hives  . Robaxin [Methocarbamol]   . Tomato Hives  . Adhesive [Tape] Rash    Sensitive to EKG leads; sensitive to 51M Micropore tape  . Tetracycline Hcl Rash    Can take Doxy  . Tetracyclines & Related Rash   Family History  Problem Relation Age of Onset  . Rheumatologic disease Mother   . Diabetes Mother   . Heart attack Father   . Rheumatologic disease Sister   . Arthritis Sister        --Rheumatoid arthritis  . Ovarian cancer Sister 10  . Crohn's disease Brother   . Kidney failure Brother   . Ulcerative  colitis Brother   . Glaucoma Brother      Past medical history, social, surgical and family history all reviewed in electronic medical record.  No pertanent information unless stated regarding to the chief complaint.   Review of Systems:Review of systems  No headache, visual changes, nausea, vomiting, diarrhea, constipation, dizziness, abdominal pain, skin rash, fevers, chills, night sweats, weight loss, swollen lymph nodes, body aches, joint swelling, muscle aches, chest pain, shortness of breath, mood changes.   Objective  Blood pressure (!) 156/84, pulse 99, height 5' (1.524 m), weight 238 lb (108 kg), last menstrual period 01/16/2002, SpO2 95 %.   General: No apparent distress alert and oriented x3 mood and affect normal, dressed appropriately.  HEENT: Pupils equal, extraocular movements intact  Respiratory: Patient's speak in full sentences and does not appear short of breath  Cardiovascular: No lower extremity edema, non tender, no erythema  Skin: Warm dry intact with no signs of infection or rash on extremities or on axial skeleton.  Abdomen: Soft nontender  Neuro: Cranial nerves II through XII are intact, neurovascularly intact in all extremities with 2+ DTRs and 2+ pulses.  Lymph: No lymphadenopathy of posterior or anterior cervical chain or axillae bilaterally.  Gait antalgic MSK:  tender with limited range of motion and symmetric strength and tone of shoulders, elbows, wrist, hip, and ankles bilaterally.  Arthritic changes of multiple joints  Knee: Bilateral valgus deformity noted. Large thigh to calf ratio.  Tender to palpation over medial and PF joint line.  ROM full in flexion and extension and lower leg rotation. instability with valgus force.  painful patellar compression. Patellar glide with moderate crepitus. Patellar and quadriceps tendons unremarkable. Hamstring and quadriceps strength is normal.  Back Exam:  Inspection: Loss of lordosis and degenerative  scoliosis Motion shows decreased range of motion of 5 to 10 degrees in all planes SLR laying: Negative significant tightness of the hamstrings bilaterally XSLR laying: Negative  Palpable tenderness: Tender to palpation in the paraspinal musculature lumbar spine right greater than left. FABER: Tightness bilaterally. Sensory change: Gross sensation intact to all lumbar and sacral dermatomes.  Reflexes: 2+ at both patellar tendons, 2+ at achilles tendons, Babinski's downgoing.  Strength of lower legs 4+ out of 5 but symmetric  Osteopathic findings C2 flexed rotated and side bent right C4 flexed rotated and side bent left T3 extended rotated and side bent right inhaled third rib T6  extended rotated and side bent left L2 flexed rotated and side bent right Sacrum right on right     Impression and Recommendations:     This case required medical decision making of moderate complexity.      Note: This dictation was prepared with Dragon dictation along with smaller phrase technology. Any transcriptional errors that result from this process are unintentional.

## 2017-07-05 ENCOUNTER — Ambulatory Visit (INDEPENDENT_AMBULATORY_CARE_PROVIDER_SITE_OTHER): Payer: Medicare Other | Admitting: Family Medicine

## 2017-07-05 ENCOUNTER — Encounter: Payer: Self-pay | Admitting: Family Medicine

## 2017-07-05 VITALS — BP 156/84 | HR 99 | Ht 60.0 in | Wt 238.0 lb

## 2017-07-05 DIAGNOSIS — M999 Biomechanical lesion, unspecified: Secondary | ICD-10-CM

## 2017-07-05 DIAGNOSIS — M109 Gout, unspecified: Secondary | ICD-10-CM

## 2017-07-05 MED ORDER — ALLOPURINOL 100 MG PO TABS: 100.0000 mg | ORAL_TABLET | Freq: Every day | ORAL | 6 refills | Status: DC

## 2017-07-05 NOTE — Patient Instructions (Addendum)
Good to see you  b6 100mg  daily  Refilled allopurinol at 100mg  daily  Lemon juice in water probabaly better then apple cider vinegar.  Add tart cherry extract at night or could do the juice but would do that during the daytime due to sugar.  Lets see how this goes  See me again I n4 weeks (30 minute appointment)

## 2017-07-06 NOTE — Assessment & Plan Note (Signed)
Decision today to treat with OMT was based on Physical Exam  After verbal consent patient was treated with HVLA, ME, FPR techniques in  thoracic, lumbar and sacral areas  Patient tolerated the procedure well with improvement in symptoms  Patient given exercises, stretches and lifestyle modifications  See medications in patient instructions if given  Patient will follow up in 4 weeks 

## 2017-07-06 NOTE — Assessment & Plan Note (Signed)
Patient seemed to be doing fairly well but now is having worsening symptoms again.  Uric acid last time was 7.4.  Restarted allopurinol and warned of potential side effects.  Patient likely will have hopefully improvement in low dose.  Continue with osteopathic manipulation and is helpful.  We have discussed multiple times about the possibility of a seronegative rheumatoid arthritis and treatment which patient has declined.  Patient will continue to follow-up with me in 1 month intervals.

## 2017-07-26 ENCOUNTER — Ambulatory Visit
Admission: RE | Admit: 2017-07-26 | Discharge: 2017-07-26 | Disposition: A | Discharge status: Home / Self Care | Payer: Medicare Other | Source: Ambulatory Visit | Attending: Obstetrics and Gynecology | Admitting: Obstetrics and Gynecology

## 2017-07-26 DIAGNOSIS — Z1231 Encounter for screening mammogram for malignant neoplasm of breast: Secondary | ICD-10-CM | POA: Diagnosis not present

## 2017-07-26 DIAGNOSIS — H35373 Puckering of macula, bilateral: Secondary | ICD-10-CM | POA: Diagnosis not present

## 2017-07-26 DIAGNOSIS — H35363 Drusen (degenerative) of macula, bilateral: Secondary | ICD-10-CM | POA: Diagnosis not present

## 2017-07-26 DIAGNOSIS — Z961 Presence of intraocular lens: Secondary | ICD-10-CM | POA: Diagnosis not present

## 2017-07-26 DIAGNOSIS — H5315 Visual distortions of shape and size: Secondary | ICD-10-CM | POA: Diagnosis not present

## 2017-08-01 NOTE — Progress Notes (Signed)
[name] [address], [place] [address] Phone: [phone] Subjective:     CC: Multiple joint pain  [id number]  Kerry [name] is a 67 y.o. female coming in with complaint of back, right shoulder and neck. States her back and neck are both aching today.  Patient states that the right shoulder seems to be worse.  Plan to garden but is having difficulty secondary to the amount of pain. Patient is following up with a dental specialist next week to see if she can have removal of the implants.    Past Medical History:  Diagnosis Date  . Abnormal uterine bleeding   . Allergic rhinitis, cause unspecified   . Arthritis of knee, right   . Bursitis of left shoulder   . 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 Chronic pain   . Dyspareunia   . Elevated cholesterol   . Fibroid   . Foot fracture, left 01/2015   and torn tendons  . GERD (gastroesophageal reflux disease)   . HTN (hypertension)   . Kidney stones   . Sleep apnea   . Urinary incontinence    Past Surgical History:  Procedure Laterality Date  .  dilatation and curettage  2000  . CARPAL TUNNEL RELEASE  1998  . CATARACT EXTRACTION Bilateral   . dental implants  2010  . ESOPHAGEAL DILATION  2010  . HAMMER TOE SURGERY     rt  . PELVIC LAPAROSCOPY  1990  . ROTATOR CUFF REPAIR Right 09/2011   Dr. Tamera Punt  . ROTATOR CUFF REPAIR Left 11/11/13   Dr. Tamera Punt   . SEPTOPLASTY    . UMBILICAL HERNIA REPAIR  2010   Social History   Socioeconomic History  . Marital status: Married    Spouse name: Not on file  . Number of children: Not on file  . Years of education: Not on file  . Highest education level: Not on file  Occupational History  . Occupation: Retired  Scientific laboratory technician  . Financial resource strain: Not on file  . Food insecurity:    Worry: Not on file    Inability: Not on file  . Transportation needs:    Medical: Not on file    Non-medical: Not on file  Tobacco Use  .  Smoking status: Never Smoker  . Smokeless tobacco: Never Used  Substance and Sexual Activity  . Alcohol use: No    Alcohol/week: 0.0 oz  . Drug use: No  . Sexual activity: Never    Partners: Male    Birth control/protection: Post-menopausal  Lifestyle  . Physical activity:    Days per week: Not on file    Minutes per session: Not on file  . Stress: Not on file  Relationships  . Social connections:    Talks on phone: Not on file    Gets together: Not on file    Attends religious service: Not on file    Active member of club or organization: Not on file    Attends meetings of clubs or organizations: Not on file    Relationship status: Not on file  Other Topics Concern  . Not on file  Social History Narrative  . Not on file   Allergies  Allergen Reactions  . Molds & Smuts Other (See Comments), Palpitations, Shortness Of Breath and Swelling  . Cefixime Swelling  . Celebrex [Celecoxib] Swelling  . Cobalt Other (See Comments)    Per allergist  . Gabapentin  Other (See Comments)    Joint pain  . Levofloxacin Other (See Comments)    Muscle and joint pain  . Molybdenum Other (See Comments)    Per allergist  . Other Other (See Comments)    TEQUIN. TEQUIN - JOINT AND MUSCLE PAIN Other Reaction: painful joints TEQUIN. TEQUIN - JOINT AND MUSCLE PAIN Tantal Metal  . Penicillins Hives  . Robaxin [Methocarbamol]   . Tomato Hives  . Adhesive [Tape] Rash    Sensitive to EKG leads; sensitive to 47M Micropore tape  . Dust Mite Extract Itching, Other (See Comments) and Palpitations  . Tetracycline Hcl Rash    Can take Doxy  . Tetracyclines & Related Rash   Family History  Problem Relation Age of Onset  . Rheumatologic disease Mother   . Diabetes Mother   . Heart attack Father   . Rheumatologic disease Sister   . Arthritis Sister        --Rheumatoid arthritis  . Ovarian cancer Sister 32  . Crohn's disease Brother   . Kidney failure Brother   . Ulcerative colitis Brother   .  Glaucoma Brother      Past medical history, social, surgical and family history all reviewed in electronic medical record.  No pertanent information unless stated regarding to the chief complaint.   Review of Systems:Review of systems updated and as accurate as of 08/02/17  No headache, visual changes, nausea, vomiting, diarrhea, constipation, dizziness, abdominal pain, skin rash, fevers, chills, night sweats, weight loss, swollen lymph nodes, , chest pain, shortness of breath, mood changes.  Positive muscle aches, joint swelling, muscle aches body aches  Objective  Blood pressure (!) 160/80, pulse (!) 114, height 5' (1.524 m), weight 241 lb (109.3 kg), last menstrual period 01/16/2002, SpO2 96 %. Systems examined below as of 08/02/17   General: No apparent distress alert and oriented x3 mood and affect normal, dressed appropriately.  HEENT: Pupils equal, extraocular movements intact  Respiratory: Patient's speak in full sentences and does not appear short of breath  Cardiovascular: 2+lower extremity edema, non tender, no erythema  Skin: Warm dry intact with no signs of infection or rash on extremities or on axial skeleton.  Abdomen: Soft nontender  Neuro: Cranial nerves II through XII are intact, neurovascularly intact in all extremities with 2+ DTRs and 2+ pulses.  Lymph: No lymphadenopathy of posterior or anterior cervical chain or axillae bilaterally.  Gait severely antalgic MSK: Tender in multiple joints as well as muscles today.  Patient has some limited range of motion in all planes.  Positive impingement of the shoulders bilaterally.  4+ out of 5 strength of the rotator cuffs.  Patient's neck has significant decrease in range of motion, mild positive Spurling's on the right side.  Crepitus noted. Back exam shows degenerative scoliosis noted.  Patient does have tenderness to palpation the paraspinal musculature lumbar spine right greater than left.  Significant tightness and unable to  do [facility] test.  Negative straight leg test.  Osteopathic findings C3 flexed rotated and side bent right C6 flexed rotated and side bent left T3 extended rotated and side bent left inhaled third rib T8 extended rotated and side bent right L2 flexed rotated and side bent right Sacrum right on right     Impression and Recommendations:     This case required medical decision making of moderate complexity.      Note: This dictation was prepared with Dragon dictation along with smaller phrase technology. Any transcriptional errors that result from  this process are unintentional.

## 2017-08-01 NOTE — Progress Notes (Signed)
67 y.o. [id number] Married Caucasian female here for annual exam.    Denies vaginal bleeding.   Having dental issues and allergy to metal.  Working with her dental team to create a plan for this.   Having LE edema.  Saw cardiology and work up negative.  Not wearing compression hose but has them at home.   Bladder urgency.  Vaginal pressure.  Did pelvic floor therapy and did not have satisfying results.   PCP:    Christa See, MD   Patient's last menstrual period was 08/16/1992.           Sexually active: No.  The current method of family planning is post menopausal status.    Exercising: Yes.    gardening Smoker:  no  Health Maintenance: Pap:  07-28-16 negative            6-8- 16 negative  History of abnormal Pap:  yes MMG:  07-26-17 density c/BIRADS 1 negative  Colonoscopy:  2011 in [place]- next due 2021 BMD:   09-10-13  Result  Normal  TDaP:  03-30-14  HIV: unsure  Hep C: 11-17-14 negative  Screening Labs:  Hb today: PCP, Urine today: not collected    reports that she has never smoked. She has never used smokeless tobacco. She reports that she does not drink alcohol or use drugs.  Past Medical History:  Diagnosis Date  . Abnormal uterine bleeding   . Allergic rhinitis, cause unspecified   . Arthritis of knee, right   . Bursitis of left shoulder   . 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 Chronic pain   . Dyspareunia   . Elevated cholesterol   . Fibroid   . Foot fracture, left 01/2015   and torn tendons  . GERD (gastroesophageal reflux disease)   . HTN (hypertension)   . Kidney stones   . Sleep apnea   . Urinary incontinence     Past Surgical History:  Procedure Laterality Date  .  dilatation and curettage  2000  . CARPAL TUNNEL RELEASE  1998  . CATARACT EXTRACTION Bilateral   . dental implants  2010  . ESOPHAGEAL DILATION  2010  . HAMMER TOE SURGERY     rt  . PELVIC LAPAROSCOPY  1990  . ROTATOR CUFF REPAIR Right 09/2011   Dr. Tamera Punt  . ROTATOR  CUFF REPAIR Left 11/11/13   Dr. Tamera Punt   . SEPTOPLASTY    . UMBILICAL HERNIA REPAIR  2010    Current Outpatient Medications  Medication Sig Dispense Refill  . Cholecalciferol (VITAMIN D3) 5000 units CAPS Take 5,000 Units by mouth daily.     . Cyanocobalamin (B-12) 1000 MCG/ML KIT Inject 1 Dose as directed every 14 (fourteen) days. 12 kit 3  . doxycycline (VIBRA-TABS) 100 MG tablet Take 100 mg by mouth 2 (two) times daily.  0  . Multiple Vitamin (MULTIVITAMIN) tablet Take 1 tablet by mouth daily.     . Probiotic Product (PROBIOTIC ADVANCED PO) Take by mouth. Patient states taking 5 billion, one capsule daily.    . Pyridoxine HCl (B-6) 100 MG TABS     . vitamin C (ASCORBIC ACID) 500 MG tablet Take 500 mg by mouth 2 (two) times daily.      No current facility-administered medications for this visit.     Family History  Problem Relation Age of Onset  . Rheumatologic disease Mother   . Diabetes Mother   . Heart attack Father   . Rheumatologic disease Sister   .  Arthritis Sister        --Rheumatoid arthritis  . Ovarian cancer Sister 63  . Crohn's disease Brother   . Kidney failure Brother   . Ulcerative colitis Brother   . Glaucoma Brother     Review of Systems  Constitutional: Negative.   HENT: Positive for ear pain.        Gum disease  Eyes: Negative.   Cardiovascular: Positive for leg swelling.  Gastrointestinal: Positive for abdominal distention.  Endocrine:       Craving sweets  Genitourinary: Positive for urgency.       Night urination    Musculoskeletal: Positive for arthralgias and myalgias.  Skin: Positive for rash.       Itching   Allergic/Immunologic: Negative.   Neurological: Positive for headaches.  Hematological: Negative.   Psychiatric/Behavioral: Negative.     Exam:   BP (!) 170/98 (BP Location: Right Arm, Patient Position: Sitting, Cuff Size: Large)   Pulse 66   Resp 14   Ht 5' (1.524 m)   Wt 241 lb 1.6 oz (109.4 kg)   LMP 08/16/1992   BMI  47.09 kg/m     General appearance: alert, cooperative and appears stated age Head: Normocephalic, without obvious abnormality, atraumatic Neck: no adenopathy, supple, symmetrical, trachea midline and thyroid normal to inspection and palpation Lungs: clear to auscultation bilaterally Breasts: normal appearance, no masses or tenderness, No nipple retraction or dimpling, No nipple discharge or bleeding, No axillary or supraclavicular adenopathy Heart: regular rate and rhythm Abdomen: obese and protruberant, soft, non-tender; no masses, no organomegaly Extremities: extremities normal, atraumatic, no cyanosis or edema Skin: Skin color, texture, turgor normal. No rashes or lesions Lymph nodes: Cervical, supraclavicular, and axillary nodes normal. No abnormal inguinal nodes palpated Neurologic: Grossly normal  Pelvic: External genitalia:  no lesions              Urethra:  normal appearing urethra with no masses, tenderness or lesions              Bartholins and Skenes: normal                 Vagina: normal appearing vagina with normal color and discharge, no lesions              Cervix: no lesions              Pap taken: No. Bimanual Exam:  Uterus:  normal size, contour, position, consistency, mobility, non-tender              Adnexa: no mass, fullness, tenderness              Rectal exam: Yes.  .  Confirms.              Anus:  normal sphincter tone, no lesions  Chaperone was present for exam.  Assessment:   Well woman visit with normal exam. FH ovarian cancer.  Remote hx abnormal pap.  Mixed incontinence.  Continued urgency.  Fibroids. Weight gain/obesity.   Plan: Mammogram screening. Recommended self breast awareness. Pap and HR HPV as above. Guidelines for Calcium, Vitamin D, regular exercise program including cardiovascular and weight bearing exercise. Pelvic US in Nov. 2019.  Information on weight loss program through Dr. Dennard Nip.  Declines referral back to pelvic floor  PT currently but would consider in the future.  Follow up annually and prn.   After visit summary provided.

## 2017-08-02 ENCOUNTER — Ambulatory Visit (INDEPENDENT_AMBULATORY_CARE_PROVIDER_SITE_OTHER): Payer: Medicare Other | Admitting: Family Medicine

## 2017-08-02 ENCOUNTER — Ambulatory Visit (INDEPENDENT_AMBULATORY_CARE_PROVIDER_SITE_OTHER): Payer: Medicare Other | Admitting: Obstetrics and Gynecology

## 2017-08-02 ENCOUNTER — Encounter: Payer: Self-pay | Admitting: Obstetrics and Gynecology

## 2017-08-02 ENCOUNTER — Encounter: Payer: Self-pay | Admitting: Family Medicine

## 2017-08-02 ENCOUNTER — Other Ambulatory Visit: Payer: Self-pay

## 2017-08-02 VITALS — BP 160/80 | HR 114 | Ht 60.0 in | Wt 241.0 lb

## 2017-08-02 VITALS — BP 170/98 | HR 66 | Resp 14 | Ht 60.0 in | Wt 241.1 lb

## 2017-08-02 DIAGNOSIS — Z01419 Encounter for gynecological examination (general) (routine) without abnormal findings: Secondary | ICD-10-CM

## 2017-08-02 DIAGNOSIS — M999 Biomechanical lesion, unspecified: Secondary | ICD-10-CM

## 2017-08-02 DIAGNOSIS — Z124 Encounter for screening for malignant neoplasm of cervix: Secondary | ICD-10-CM

## 2017-08-02 DIAGNOSIS — M109 Gout, unspecified: Secondary | ICD-10-CM

## 2017-08-02 DIAGNOSIS — Z8041 Family history of malignant neoplasm of ovary: Secondary | ICD-10-CM | POA: Diagnosis not present

## 2017-08-02 MED ORDER — DOXYCYCLINE HYCLATE 100 MG PO TABS: 100.0000 mg | ORAL_TABLET | Freq: Two times a day (BID) | ORAL | 0 refills | Status: AC

## 2017-08-02 NOTE — Assessment & Plan Note (Signed)
Continues to have elevated uric acid levels.  Patient has been noncompliant on the allopurinol encourage her to take it on a regular basis still.  Patient needs probably higher dosing as well in the long run.  Patient has been fairly noncompliant and with multiple different medications and has multiple allergies.  We will see how patients continues to respond to her treatment.  Continue manipulation.  Follow-up again in 4 to 6 weeks

## 2017-08-02 NOTE — Assessment & Plan Note (Signed)
Decision today to treat with OMT was based on Physical Exam  After verbal consent patient was treated with HVLA, ME, FPR techniques in cervical, thoracic, rib, lumbar and sacral areas  Patient tolerated the procedure well with improvement in symptoms  Patient given exercises, stretches and lifestyle modifications  See medications in patient instructions if given  Patient will follow up in 4-6 weeks 

## 2017-08-02 NOTE — Patient Instructions (Signed)

## 2017-08-02 NOTE — Patient Instructions (Signed)
Lets do another round of doxycycline.  Stay active. If you can  I think you are having a flare.  I would after you tak to the dentist start the allopurinol and the tart cherry as soon as you can  Ice the shoulder at night and lets watch it  See me again in 4 weeks (30 minutes appointment)

## 2017-08-08 ENCOUNTER — Ambulatory Visit: Payer: Medicare Other | Admitting: Pulmonary Disease

## 2017-08-12 ENCOUNTER — Encounter: Payer: Self-pay | Admitting: Family Medicine

## 2017-08-14 ENCOUNTER — Encounter: Payer: Self-pay | Admitting: Pulmonary Disease

## 2017-08-14 ENCOUNTER — Encounter: Payer: Self-pay | Admitting: Family Medicine

## 2017-08-14 ENCOUNTER — Ambulatory Visit (INDEPENDENT_AMBULATORY_CARE_PROVIDER_SITE_OTHER): Payer: Medicare Other | Admitting: Adult Health

## 2017-08-14 ENCOUNTER — Ambulatory Visit (INDEPENDENT_AMBULATORY_CARE_PROVIDER_SITE_OTHER): Payer: Medicare Other | Admitting: Family Medicine

## 2017-08-14 VITALS — BP 170/98 | HR 107 | Ht 60.0 in | Wt 236.0 lb

## 2017-08-14 DIAGNOSIS — G8929 Other chronic pain: Secondary | ICD-10-CM

## 2017-08-14 DIAGNOSIS — M545 Low back pain, unspecified: Secondary | ICD-10-CM

## 2017-08-14 DIAGNOSIS — G4733 Obstructive sleep apnea (adult) (pediatric): Secondary | ICD-10-CM

## 2017-08-14 DIAGNOSIS — M999 Biomechanical lesion, unspecified: Secondary | ICD-10-CM | POA: Diagnosis not present

## 2017-08-14 NOTE — Patient Instructions (Signed)
Good to see you  I wish you luck  I am here if you need me MAyeb call or you can make an appointment in 4-6 weeks to have a spot

## 2017-08-14 NOTE — Progress Notes (Signed)
[name] [address] [place], [place] [address] Phone: [phone] Subjective:    I'm seeing this patient by the request  of:    CC: Worsening back and hip pain  [id number]  Kerry [name] is a 67 y.o. female coming in with complaint of back and hip pain. She states that she was adjusted last visit and was asked to flex the left hip and she was unable to do so. Feels that she continues to not be able to flex the left hip. Has been also having concurrent back pain in the lumbar spine since last visit. Notes coming off doxycyline and feeling like she is not experiencing as much swelling.        Past Medical History:  Diagnosis Date  . Abnormal uterine bleeding   . Allergic rhinitis, cause unspecified   . Arthritis of knee, right   . Bursitis of left shoulder   . 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 Chronic pain   . Dyspareunia   . Elevated cholesterol   . Fibroid   . Foot fracture, left 01/2015   and torn tendons  . GERD (gastroesophageal reflux disease)   . HTN (hypertension)   . Kidney stones   . Sleep apnea   . Urinary incontinence    Past Surgical History:  Procedure Laterality Date  .  dilatation and curettage  2000  . CARPAL TUNNEL RELEASE  1998  . CATARACT EXTRACTION Bilateral   . dental implants  2010  . ESOPHAGEAL DILATION  2010  . HAMMER TOE SURGERY     rt  . PELVIC LAPAROSCOPY  1990  . ROTATOR CUFF REPAIR Right 09/2011   Dr. Tamera Punt  . ROTATOR CUFF REPAIR Left 11/11/13   Dr. Tamera Punt   . SEPTOPLASTY    . UMBILICAL HERNIA REPAIR  2010   Social History   Socioeconomic History  . Marital status: Married    Spouse name: Not on file  . Number of children: Not on file  . Years of education: Not on file  . Highest education level: Not on file  Occupational History  . Occupation: Retired  Scientific laboratory technician  . Financial resource strain: Not on file  . Food insecurity:    Worry: Not on file    Inability: Not on file    . Transportation needs:    Medical: Not on file    Non-medical: Not on file  Tobacco Use  . Smoking status: Never Smoker  . Smokeless tobacco: Never Used  Substance and Sexual Activity  . Alcohol use: No    Alcohol/week: 0.0 oz  . Drug use: No  . Sexual activity: Never    Partners: Male    Birth control/protection: Post-menopausal  Lifestyle  . Physical activity:    Days per week: Not on file    Minutes per session: Not on file  . Stress: Not on file  Relationships  . Social connections:    Talks on phone: Not on file    Gets together: Not on file    Attends religious service: Not on file    Active member of club or organization: Not on file    Attends meetings of clubs or organizations: Not on file    Relationship status: Not on file  Other Topics Concern  . Not on file  Social History Narrative  . Not on file   Allergies  Allergen Reactions  . Molds & Smuts Other (See Comments), Palpitations, Shortness  Of Breath and Swelling  . Cefixime Swelling  . Celebrex [Celecoxib] Swelling  . Cobalt Other (See Comments)    Per allergist  . Gabapentin Other (See Comments)    Joint pain  . Levofloxacin Other (See Comments)    Muscle and joint pain  . Molybdenum Other (See Comments)    Per allergist  . Other Other (See Comments)    TEQUIN. TEQUIN - JOINT AND MUSCLE PAIN Other Reaction: painful joints TEQUIN. TEQUIN - JOINT AND MUSCLE PAIN Tantal Metal  . Penicillins Hives  . Robaxin [Methocarbamol]   . Tomato Hives  . Adhesive [Tape] Rash    Sensitive to EKG leads; sensitive to 27M Micropore tape  . Dust Mite Extract Itching, Other (See Comments) and Palpitations  . Tetracycline Hcl Rash    Can take Doxy  . Tetracyclines & Related Rash   Family History  Problem Relation Age of Onset  . Rheumatologic disease Mother   . Diabetes Mother   . Heart attack Father   . Rheumatologic disease Sister   . Arthritis Sister        --Rheumatoid arthritis  . Ovarian cancer  Sister 18  . Crohn's disease Brother   . Kidney failure Brother   . Ulcerative colitis Brother   . Glaucoma Brother      Past medical history, social, surgical and family history all reviewed in electronic medical record.  No pertanent information unless stated regarding to the chief complaint.   Review of Systems:Review of systems updated and as accurate as of 08/14/17  No headache, visual changes, nausea, vomiting, diarrhea, constipation, dizziness, abdominal pain, skin rash, fevers, chills, night sweats, weight loss, swollen lymph nodes,, chest pain, shortness of breath, mood changes.  Positive muscle aches, joint swelling, body aches  Objective  Blood pressure (!) 170/98, pulse (!) 107, height 5' (1.524 m), weight 236 lb (107 kg), last menstrual period 01/16/2002, SpO2 98 %. Systems examined below as of 08/14/17   General: No apparent distress alert and oriented x3 mood and affect normal, dressed appropriately.  HEENT: Pupils equal, extraocular movements intact  Respiratory: Patient's speak in full sentences and does not appear short of breath  Cardiovascular: 2+ lower extremity edema, non tender, no erythema  Skin: Warm dry intact with no signs of infection or rash on extremities or on axial skeleton.  Abdomen: Soft nontender  Neuro: Cranial nerves II through XII are intact, neurovascularly intact in all extremities with 2+ DTRs and 2+ pulses.  Lymph: No lymphadenopathy of posterior or anterior cervical chain or axillae bilaterally.  Gait antalgic MSK:  tender with mild limited range of motion and good stability and symmetric strength and tone of shoulders, elbows, wrist, hip, and ankles bilaterally.  Moderate to severe arthritic changes of multiple joints  Back Exam:  Inspection: Degenerative scoliosis Motion: Flexion 35 deg, Extension 25 deg, Side Bending to 35 deg bilaterally,  Rotation to 25 deg bilaterally  SLR laying: Negative  XSLR laying: Negative  Palpable tenderness:  Tender to palpation the paraspinal musculature.Marland Kitchen FABER: bilateral tightness. Sensory change: Gross sensation intact to all lumbar and sacral dermatomes.  Reflexes: 2+ at both patellar tendons, 2+ at achilles tendons, Babinski's downgoing.  Strength at foot  Plantar-flexion: 5/5 Dorsi-flexion: 5/5 Eversion: 5/5 Inversion: 5/5  Leg strength  Quad: 5/5 Hamstring: 5/5 Hip flexor: 5/5 Hip abductors: 5/5  Gait unremarkable.  Osteopathic findings C2 flexed rotated and side bent right T3 extended rotated and side bent right inhaled third rib T6 extended rotated and side  bent left L4 flexed rotated and side bent right Sacrum right on right    Impression and Recommendations:     This case required medical decision making of moderate complexity.      Note: This dictation was prepared with Dragon dictation along with smaller phrase technology. Any transcriptional errors that result from this process are unintentional.

## 2017-08-14 NOTE — Progress Notes (Signed)
$'@Patient'M$  ID: Kerry [name], female    DOB: 26-May-1950, 67 y.o.   MRN: [id number]  Chief Complaint  Patient presents with  . Follow-up    OSA     Referring provider: Street, [name], *  HPI: 67 year old female followed for moderate sleep apnea  Sleep tests HST 03/04/15 >> AHI 21.6, SpO2 low 80% Auto CPAP 07/01/16 to 07/30/16 >> used on 30 of 30 nights with average 5 hrs 54 min.  Average AHI 0.3 with median CPAP 6 and 95 th percentile CPAP 8 cm H2O  08/14/2017 Follow up : OSA  Patient presents for a one-year follow-up for sleep apnea.  Patient says she is doing well on CPAP.  She wears it each night.  Download shows excellent compliance with average usage around 5 hours.  AHI 0.3 .  Minimum leaks  Patient was supposed to have a repeat sleep study done last year however patient had to cancel due to dental work.  Patient says that her dental work has been extensive and continues to be ongoing.  She has had some major issues with dental implants and allergy to the metal component of these dental implants.  She has had lots of swelling and does not want to proceed with a sleep study currently.  Would like to continue on CPAP as she feels like she is doing well.   She has had some troubles with her DME.  With supply issues.        Allergies  Allergen Reactions  . Molds & Smuts Other (See Comments), Palpitations, Shortness Of Breath and Swelling  . Cefixime Swelling  . Celebrex [Celecoxib] Swelling  . Cobalt Other (See Comments)    Per allergist  . Gabapentin Other (See Comments)    Joint pain  . Levofloxacin Other (See Comments)    Muscle and joint pain  . Molybdenum Other (See Comments)    Per allergist  . Other Other (See Comments)    TEQUIN. TEQUIN - JOINT AND MUSCLE PAIN Other Reaction: painful joints TEQUIN. TEQUIN - JOINT AND MUSCLE PAIN Tantal Metal  . Penicillins Hives  . Robaxin [Methocarbamol]   . Tomato Hives  . Adhesive [Tape] Rash    Sensitive to EKG  leads; sensitive to 3M Micropore tape  . Dust Mite Extract Itching, Other (See Comments) and Palpitations  . Tetracycline Hcl Rash    Can take Doxy  . Tetracyclines & Related Rash    Immunization History  Administered Date(s) Administered  . Influenza,inj,Quad PF,6+ Mos 11/17/2014    Past Medical History:  Diagnosis Date  . Abnormal uterine bleeding   . Allergic rhinitis, cause unspecified   . Arthritis of knee, right   . Bursitis of left shoulder   . 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 Chronic pain   . Dyspareunia   . Elevated cholesterol   . Fibroid   . Foot fracture, left 01/2015   and torn tendons  . GERD (gastroesophageal reflux disease)   . HTN (hypertension)   . Kidney stones   . Sleep apnea   . Urinary incontinence     Tobacco History: Social History   Tobacco Use  Smoking Status Never Smoker  Smokeless Tobacco Never Used   Counseling given: Not Answered   Outpatient Medications Prior to Visit  Medication Sig Dispense Refill  . Cholecalciferol (VITAMIN D3) 5000 units CAPS Take 5,000 Units by mouth daily.     . Cyanocobalamin (B-12) 1000 MCG/ML KIT Inject 1 Dose as directed  every 14 (fourteen) days. 12 kit 3  . Multiple Vitamin (MULTIVITAMIN) tablet Take 1 tablet by mouth daily.     . Probiotic Product (PROBIOTIC ADVANCED PO) Take by mouth. Patient states taking 5 billion, one capsule daily.    . Pyridoxine HCl (B-6) 100 MG TABS     . vitamin C (ASCORBIC ACID) 500 MG tablet Take 500 mg by mouth 2 (two) times daily.      No facility-administered medications prior to visit.      Review of Systems  Constitutional:   No  weight loss, night sweats,  Fevers, chills,+ fatigue, or  lassitude.  HEENT:   No headaches,  Difficulty swallowing,  Tooth/dental problems, or  Sore throat,                No sneezing, itching, ear ache, nasal congestion, post nasal drip,   CV:  No chest pain,  Orthopnea, PND, swelling in lower extremities, anasarca, dizziness,  palpitations, syncope.   GI  No heartburn, indigestion, abdominal pain, nausea, vomiting, diarrhea, change in bowel habits, loss of appetite, bloody stools.   Resp: No shortness of breath with exertion or at rest.  No excess mucus, no productive cough,  No non-productive cough,  No coughing up of blood.  No change in color of mucus.  No wheezing.  No chest wall deformity  Skin: no rash or lesions.  GU: no dysuria, change in color of urine, no urgency or frequency.  No flank pain, no hematuria   MS:  gen weakness +   Physical Exam  BP (!) 142/80   Pulse (!) 105   Ht 5' (1.524 m)   Wt 236 lb (107 kg)   LMP 01/16/2002   SpO2 97%   BMI 46.09 kg/m   GEN: A/Ox3; pleasant , NAD, obese , cane    HEENT:  [place]/AT,  EACs-clear, TMs-wnl, NOSE-clear, THROAT-clear, no lesions, no postnasal drip or exudate noted. Class 3 MP airway   NECK:  Supple w/ fair ROM; no JVD; normal carotid impulses w/o bruits; no thyromegaly or nodules palpated; no lymphadenopathy.    RESP  Clear  P & A; w/o, wheezes/ rales/ or rhonchi. no accessory muscle use, no dullness to percussion  CARD:  RRR, no m/r/g, no peripheral edema, pulses intact, no cyanosis or clubbing.  GI:   Soft & nt; nml bowel sounds; no organomegaly or masses detected.   Musco: Warm bil, no deformities or joint swelling noted.   Neuro: alert, no focal deficits noted.    Skin: Warm, no lesions or rashes    Lab Results:  CBC   BNP No results found for: BNP  ProBNP  Imaging:    Assessment & Plan:   OSA (obstructive sleep apnea) Moderate OSA -well controlled on CPAP Will contact DME to help with customer service   Plan  Patient Instructions  Keep up good work.  Continue on CPAP  At bedtime  .  Work on healthy weight .  Do not drive if sleepy .  Follow up with Dr. Halford Chessman  In 1 year and As needed.        Obesity Wt loss      Rexene Edison, NP 08/14/2017

## 2017-08-14 NOTE — Assessment & Plan Note (Signed)
Wt loss  

## 2017-08-14 NOTE — Assessment & Plan Note (Signed)
Moderate OSA -well controlled on CPAP Will contact DME to help with customer service   Plan  Patient Instructions  Keep up good work.  Continue on CPAP  At bedtime  .  Work on healthy weight .  Do not drive if sleepy .  Follow up with Dr. Halford Chessman  In 1 year and As needed.

## 2017-08-14 NOTE — Assessment & Plan Note (Signed)
Low back pain that seems to be multifactorial.  Still elevated uric acid and likely underlying autoimmune disease.  Patient still looking into the potential for the teeth. rtc in 4 weeks

## 2017-08-14 NOTE — Assessment & Plan Note (Signed)
Decision today to treat with OMT was based on Physical Exam  After verbal consent patient was treated with  ME, FPR techniques in cervical, thoracic, rib lumbar and sacral areas  Patient tolerated the procedure well with improvement in symptoms  Patient given exercises, stretches and lifestyle modifications  See medications in patient instructions if given  Patient will follow up in 4 weeks 

## 2017-08-14 NOTE — Patient Instructions (Addendum)
Keep up good work.  Continue on CPAP  At bedtime  .  Work on healthy weight .  Do not drive if sleepy .  Follow up with Dr. Halford Chessman  In 1 year and As needed.

## 2017-08-15 ENCOUNTER — Telehealth: Payer: Self-pay | Admitting: Adult Health

## 2017-08-15 NOTE — Progress Notes (Signed)
Reviewed and agree with assessment/plan.   Sapphire Tygart, MD [facility] 01/12/2016, 12:24 PM Pager:  [phone]  

## 2017-08-15 NOTE — Telephone Encounter (Signed)
Patient seen by TP yesterday 7/30 for CPAP follow up Patient discussed with TP concerns regarding her CPAP supplies through [facility] > sometimes she get duplicate supplies, sometimes she doesn't get what she ordered, etc and poor customer service  Maxwell Caul and spoke with Estill Bamberg - patient actually gets supplies through [facility] in [place].  Even though [employer] is a sister company to [facility], they still operate separately.  Called [employer] in [place] @ [phone] - they're computers are down.  Gave patient's information, contact number and the issue she has been having.  Apparently this is not uncommon since [employer] was bought out but someone will call patient to discuss.  LMOM TCB x1 to discuss the above with the patient.

## 2017-08-27 ENCOUNTER — Ambulatory Visit: Payer: Medicare Other | Admitting: Family Medicine

## 2017-08-30 ENCOUNTER — Encounter: Payer: Self-pay | Admitting: *Deleted

## 2017-08-30 NOTE — Telephone Encounter (Signed)
LMOM TCB x2  MyChart message also sent to patient

## 2017-09-19 ENCOUNTER — Ambulatory Visit: Payer: Medicare Other | Admitting: Family Medicine

## 2017-09-20 ENCOUNTER — Encounter: Payer: Self-pay | Admitting: Family Medicine

## 2017-09-20 ENCOUNTER — Ambulatory Visit (INDEPENDENT_AMBULATORY_CARE_PROVIDER_SITE_OTHER): Payer: Medicare Other | Admitting: Family Medicine

## 2017-09-20 VITALS — BP 138/80 | HR 90 | Ht 60.0 in | Wt 233.0 lb

## 2017-09-20 DIAGNOSIS — G8929 Other chronic pain: Secondary | ICD-10-CM | POA: Diagnosis not present

## 2017-09-20 DIAGNOSIS — M999 Biomechanical lesion, unspecified: Secondary | ICD-10-CM

## 2017-09-20 DIAGNOSIS — M545 Low back pain, unspecified: Secondary | ICD-10-CM

## 2017-09-20 NOTE — Progress Notes (Signed)
[name] [place], [place] [address] Phone: [phone] Subjective:   Kerry [name], am serving as a scribe for Dr. Hulan Saas.  I'm seeing this patient by the request  of:    CC: Back pain, polyarthralgia follow-up  [id number]  Kerry [name] is a 67 y.o. female coming in with multiple complaints.  Patient continues to have discomfort and pain overall.  Possible seronegative rheumatoid arthritis.  Patient is still adamant that she feels it is more secondary to her dental implants.  Had had 1-1/2 of them removed.  After the first 1 removed seem to be feeling better but the second 1 had significant difficulty.  Patient is looking into seeing another specialist at this time.  Was treated with doxycycline for potential infection.      Past Medical History:  Diagnosis Date  . Abnormal uterine bleeding   . Allergic rhinitis, cause unspecified   . Arthritis of knee, right   . Bursitis of left shoulder   . 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 Chronic pain   . Dyspareunia   . Elevated cholesterol   . Fibroid   . Foot fracture, left 01/2015   and torn tendons  . GERD (gastroesophageal reflux disease)   . HTN (hypertension)   . Kidney stones   . Sleep apnea   . Urinary incontinence    Past Surgical History:  Procedure Laterality Date  .  dilatation and curettage  2000  . CARPAL TUNNEL RELEASE  1998  . CATARACT EXTRACTION Bilateral   . dental implants  2010  . ESOPHAGEAL DILATION  2010  . HAMMER TOE SURGERY     rt  . PELVIC LAPAROSCOPY  1990  . ROTATOR CUFF REPAIR Right 09/2011   Dr. Tamera Punt  . ROTATOR CUFF REPAIR Left 11/11/13   Dr. Tamera Punt   . SEPTOPLASTY    . UMBILICAL HERNIA REPAIR  2010   Social History   Socioeconomic History  . Marital status: Married    Spouse name: Not on file  . Number of children: Not on file  . Years of education: Not on file  . Highest education level: Not on file  Occupational  History  . Occupation: Retired  Scientific laboratory technician  . Financial resource strain: Not on file  . Food insecurity:    Worry: Not on file    Inability: Not on file  . Transportation needs:    Medical: Not on file    Non-medical: Not on file  Tobacco Use  . Smoking status: Never Smoker  . Smokeless tobacco: Never Used  Substance and Sexual Activity  . Alcohol use: [name]    Alcohol/week: 0.0 standard drinks  . Drug use: [name]  . Sexual activity: Never    Partners: Male    Birth control/protection: Post-menopausal  Lifestyle  . Physical activity:    Days per week: Not on file    Minutes per session: Not on file  . Stress: Not on file  Relationships  . Social connections:    Talks on phone: Not on file    Gets together: Not on file    Attends religious service: Not on file    Active member of club or organization: Not on file    Attends meetings of clubs or organizations: Not on file    Relationship status: Not on file  Other Topics Concern  . Not on file  Social History Narrative  . Not on file  Allergies  Allergen Reactions  . Molds & Smuts Other (See Comments), Palpitations, Shortness Of Breath and Swelling  . Cefixime Swelling  . Celebrex [Celecoxib] Swelling  . Cobalt Other (See Comments)    Per allergist  . Gabapentin Other (See Comments)    Joint pain  . Levofloxacin Other (See Comments)    Muscle and joint pain  . Molybdenum Other (See Comments)    Per allergist  . Other Other (See Comments)    TEQUIN. TEQUIN - JOINT AND MUSCLE PAIN Other Reaction: painful joints TEQUIN. TEQUIN - JOINT AND MUSCLE PAIN Tantal Metal  . Penicillins Hives  . Robaxin [Methocarbamol]   . Tomato Hives  . Adhesive [Tape] Rash    Sensitive to EKG leads; sensitive to 13M Micropore tape  . Dust Mite Extract Itching, Other (See Comments) and Palpitations  . Tetracycline Hcl Rash    Can take Doxy  . Tetracyclines & Related Rash   Family History  Problem Relation Age of Onset  .  Rheumatologic disease Mother   . Diabetes Mother   . Heart attack Father   . Rheumatologic disease Sister   . Arthritis Sister        --Rheumatoid arthritis  . Ovarian cancer Sister 51  . Crohn's disease Brother   . Kidney failure Brother   . Ulcerative colitis Brother   . Glaucoma Brother         Current Outpatient Medications (Hematological):  Marland Kitchen  Cyanocobalamin (B-12) 1000 MCG/ML KIT, Inject 1 Dose as directed every 14 (fourteen) days.  Current Outpatient Medications (Other):  Marland Kitchen  Cholecalciferol (VITAMIN D3) 5000 units CAPS, Take 5,000 Units by mouth daily.  .  Multiple Vitamin (MULTIVITAMIN) tablet, Take 1 tablet by mouth daily.  .  Probiotic Product (PROBIOTIC ADVANCED PO), Take by mouth. Patient states taking 5 billion, one capsule daily. .  Pyridoxine HCl (B-6) 100 MG TABS,  .  vitamin C (ASCORBIC ACID) 500 MG tablet, Take 500 mg by mouth 2 (two) times daily.     Past medical history, social, surgical and family history all reviewed in electronic medical record.  [name] pertanent information unless stated regarding to the chief complaint.   Review of Systems:  [name] headache, visual changes, nausea, vomiting, diarrhea, constipation, dizziness, abdominal pain, skin rash, fevers, chills, night sweats, weight loss, swollen lymph nodes, , chest pain, shortness of breath, mood changes.  Positive muscle aches, body aches, joint swelling  Objective  Blood pressure 138/80, pulse 90, height 5' (1.524 m), weight 233 lb (105.7 kg), last menstrual period 01/16/2002, SpO2 97 %.    General: [name] apparent distress alert and oriented x3 mood and affect normal, dressed appropriately.  HEENT: Pupils equal, extraocular movements intact  Respiratory: Patient's speak in full sentences and does not appear short of breath  Cardiovascular: Trace lower extremity edema, non tender, [name] erythema  Skin: Warm dry intact with [name] signs of infection or rash on extremities or on axial skeleton.  Abdomen: Soft  nontender  Neuro: Cranial nerves II through XII are intact, neurovascularly intact in all extremities with 2+ DTRs and 2+ pulses.  Lymph: Mild cervical lymphadenopathy Gait antalgic MSK:  tender with limited range of motion and mild instability and symmetric strength and tone of shoulders, elbows, wrist, hip, knee and ankles bilaterally.  Neck exam is diffusely tender.  Patient has significant loss of lordosis of the cervical and lumbar spine.  Tenderness diffusely in the paraspinal musculature.  Unable to [facility] secondary to stiffness.  Significant tightness with  straight leg test that did cause hamstring spasms.  Osteopathic findings  C7 flexed rotated and side bent left T3 extended rotated and side bent right inhaled third rib T6 extended rotated and side bent left L2 flexed rotated and side bent right Sacrum right on right     Impression and Recommendations:     This case required medical decision making of moderate complexity. The above documentation has been reviewed and is accurate and complete [name], DO       Note: This dictation was prepared with Dragon dictation along with smaller phrase technology. Any transcriptional errors that result from this process are unintentional.

## 2017-09-20 NOTE — Assessment & Plan Note (Signed)
Decision today to treat with OMT was based on Physical Exam  After verbal consent patient was treated with HVLA, ME, FPR techniques in cervical, thoracic, rib, lumbar and sacral areas  Patient tolerated the procedure well with improvement in symptoms  Patient given exercises, stretches and lifestyle modifications  See medications in patient instructions if given  Patient will follow up in 4-6 weeks 

## 2017-09-20 NOTE — Assessment & Plan Note (Signed)
Moderate to severe arthritic changes of multiple joints especially in the back.  Has been responding fairly well though to osteopathic manipulation.  Once again discussed what medications to be taking potentially regularly.  Patient wants to continue with the possibility of removing the dental implants and seeing how she feels.  Continuing to have difficulty then will need to consider treating for potential seronegative rheumatoid arthritis.

## 2017-09-20 NOTE — Patient Instructions (Signed)
I am sorry this is such an ordeal  Stay active.  Keep me updated on what you decide Check the medical board for the physicians.  See me again in 4-6 weeks

## 2017-09-24 DIAGNOSIS — K0822 Moderate atrophy of the mandible: Secondary | ICD-10-CM | POA: Diagnosis not present

## 2017-09-28 DIAGNOSIS — M272 Inflammatory conditions of jaws: Secondary | ICD-10-CM | POA: Diagnosis not present

## 2017-09-28 DIAGNOSIS — K0822 Moderate atrophy of the mandible: Secondary | ICD-10-CM | POA: Diagnosis not present

## 2017-10-17 DIAGNOSIS — M272 Inflammatory conditions of jaws: Secondary | ICD-10-CM | POA: Diagnosis not present

## 2017-10-26 ENCOUNTER — Telehealth: Payer: Self-pay | Admitting: Radiology

## 2017-10-26 NOTE — Telephone Encounter (Signed)
I'm not sure how to handle this. Forwarding to [place].

## 2017-10-26 NOTE — Telephone Encounter (Signed)
Copied from [place] [id number]. Topic: General - Other >> Oct 26, 2017 10:41 AM Burchel, Abbi R wrote: Pt calling to ask to be removed from call list.  She is being contacted as if this office was her PCP and she was only ever seen here for sports med.  She has a PCP in [place].   [phone]

## 2017-10-29 NOTE — Telephone Encounter (Signed)
I have placed an IT ticket for the IT team to assist with this request.

## 2017-10-30 NOTE — Progress Notes (Signed)
[name] [place], [place] [address] Phone: [phone] Subjective:     CC: Polyarthralgia, back pain  NIO:[id number]  Kerry [name] is a 67 y.o. female coming in with complaint of polyathrlgia. She has had all her dental implants extracted. Was feeling bad up until 2 days ago. She also would like to speak about B6 and her flu shot.  Patient has been doing relatively well but has had dental surgery recently.  Feels that dental surgery has been a little bit of facial.  Feels like some of the inflammation is improving.     Past Medical History:  Diagnosis Date  . Abnormal uterine bleeding   . Allergic rhinitis, cause unspecified   . Arthritis of knee, right   . Bursitis of left shoulder   . 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 Chronic pain   . Dyspareunia   . Elevated cholesterol   . Fibroid   . Foot fracture, left 01/2015   and torn tendons  . GERD (gastroesophageal reflux disease)   . HTN (hypertension)   . Kidney stones   . Sleep apnea   . Urinary incontinence    Past Surgical History:  Procedure Laterality Date  .  dilatation and curettage  2000  . CARPAL TUNNEL RELEASE  1998  . CATARACT EXTRACTION Bilateral   . dental implants  2010  . ESOPHAGEAL DILATION  2010  . HAMMER TOE SURGERY     rt  . PELVIC LAPAROSCOPY  1990  . ROTATOR CUFF REPAIR Right 09/2011   Dr. Tamera Punt  . ROTATOR CUFF REPAIR Left 11/11/13   Dr. Tamera Punt   . SEPTOPLASTY    . UMBILICAL HERNIA REPAIR  2010   Social History   Socioeconomic History  . Marital status: Married    Spouse name: Not on file  . Number of children: Not on file  . Years of education: Not on file  . Highest education level: Not on file  Occupational History  . Occupation: Retired  Scientific laboratory technician  . Financial resource strain: Not on file  . Food insecurity:    Worry: Not on file    Inability: Not on file  . Transportation needs:    Medical: Not on file    Non-medical:  Not on file  Tobacco Use  . Smoking status: Never Smoker  . Smokeless tobacco: Never Used  Substance and Sexual Activity  . Alcohol use: No    Alcohol/week: 0.0 standard drinks  . Drug use: No  . Sexual activity: Never    Partners: Male    Birth control/protection: Post-menopausal  Lifestyle  . Physical activity:    Days per week: Not on file    Minutes per session: Not on file  . Stress: Not on file  Relationships  . Social connections:    Talks on phone: Not on file    Gets together: Not on file    Attends religious service: Not on file    Active member of club or organization: Not on file    Attends meetings of clubs or organizations: Not on file    Relationship status: Not on file  Other Topics Concern  . Not on file  Social History Narrative  . Not on file   Allergies  Allergen Reactions  . Molds & Smuts Other (See Comments), Palpitations, Shortness Of Breath and Swelling  . Cefixime Swelling  . Celebrex [Celecoxib] Swelling  . Cobalt Other (See Comments)  Per allergist  . Gabapentin Other (See Comments)    Joint pain  . Levofloxacin Other (See Comments)    Muscle and joint pain  . Molybdenum Other (See Comments)    Per allergist  . Other Other (See Comments)    TEQUIN. TEQUIN - JOINT AND MUSCLE PAIN Other Reaction: painful joints TEQUIN. TEQUIN - JOINT AND MUSCLE PAIN Tantal Metal  . Penicillins Hives  . Robaxin [Methocarbamol]   . Tomato Hives  . Adhesive [Tape] Rash    Sensitive to EKG leads; sensitive to 33M Micropore tape  . Dust Mite Extract Itching, Other (See Comments) and Palpitations  . Tetracycline Hcl Rash    Can take Doxy  . Tetracyclines & Related Rash   Family History  Problem Relation Age of Onset  . Rheumatologic disease Mother   . Diabetes Mother   . Heart attack Father   . Rheumatologic disease Sister   . Arthritis Sister        --Rheumatoid arthritis  . Ovarian cancer Sister 42  . Crohn's disease Brother   . Kidney failure  Brother   . Ulcerative colitis Brother   . Glaucoma Brother         Current Outpatient Medications (Hematological):  Marland Kitchen  Cyanocobalamin (B-12) 1000 MCG/ML KIT, Inject 1 Dose as directed every 14 (fourteen) days.  Current Outpatient Medications (Other):  Marland Kitchen  Cholecalciferol (VITAMIN D3) 5000 units CAPS, Take 5,000 Units by mouth daily.  .  Multiple Vitamin (MULTIVITAMIN) tablet, Take 1 tablet by mouth daily.  Marland Kitchen  nystatin (MYCOSTATIN) 500000 units TABS tablet, Take 500,000 Units by mouth 3 (three) times daily. .  Probiotic Product (PROBIOTIC ADVANCED PO), Take by mouth. Patient states taking 5 billion, one capsule daily. .  Pyridoxine HCl (B-6) 100 MG TABS,  .  vitamin C (ASCORBIC ACID) 500 MG tablet, Take 500 mg by mouth 2 (two) times daily.     Past medical history, social, surgical and family history all reviewed in electronic medical record.  No pertanent information unless stated regarding to the chief complaint.   Review of Systems:  No headache, visual changes, nausea, vomiting, diarrhea, constipation, dizziness, abdominal pain, skin rash, fevers, chills, night sweats, weight loss, swollen lymph nodes, body aches, joint swelling, chest pain, shortness of breath, mood changes.  Positive muscle aches  Objective  Blood pressure (!) 148/90, pulse 86, height 5' (1.524 m), weight 237 lb (107.5 kg), last menstrual period 01/16/2002, SpO2 98 %.    General: No apparent distress alert and oriented x3 mood and affect normal, dressed appropriately.  Overweight HEENT: Pupils equal, extraocular movements intact  Respiratory: Patient's speak in full sentences and does not appear short of breath  Cardiovascular: Trace lower extremity edema, non tender, no erythema  Skin: Warm dry intact with no signs of infection or rash on extremities or on axial skeleton.  Abdomen: Soft nontender  Neuro: Cranial nerves II through XII are intact, neurovascularly intact in all extremities with 2+ DTRs and 2+  pulses.  Lymph: No lymphadenopathy of posterior or anterior cervical chain or axillae bilaterally.  Gait mild antalgic walking with the aid of a cane MSK:  tender with limited range of motion and good stability and symmetric strength and tone of shoulders, elbows, wrist, hip, knee and ankles bilaterally.  Moderate to severe arthritic changes of multiple joints  Exam loss of lordosis.  Patient is severely tender to palpation in the soft tissue everywhere.  Significant tightness in the hamstrings can cause a spasm with full  flexion.  Neurovascular intact distally.  Deep tendon reflexes intact.  Osteopathic findings C2 flexed rotated and side bent right C6 flexed rotated and side bent left T2 extended rotated and side bent right inhaled rib T5 extended rotated and side bent left L4 flexed rotated and side bent left Sacrum left on left     Impression and Recommendations:     This case required medical decision making of moderate complexity. The above documentation has been reviewed and is accurate and complete [name], DO       Note: This dictation was prepared with Dragon dictation along with smaller phrase technology. Any transcriptional errors that result from this process are unintentional.

## 2017-11-01 ENCOUNTER — Ambulatory Visit (INDEPENDENT_AMBULATORY_CARE_PROVIDER_SITE_OTHER): Payer: Medicare Other | Admitting: Family Medicine

## 2017-11-01 ENCOUNTER — Encounter: Payer: Self-pay | Admitting: Family Medicine

## 2017-11-01 VITALS — BP 148/90 | HR 86 | Ht 60.0 in | Wt 237.0 lb

## 2017-11-01 DIAGNOSIS — M999 Biomechanical lesion, unspecified: Secondary | ICD-10-CM

## 2017-11-01 DIAGNOSIS — G8929 Other chronic pain: Secondary | ICD-10-CM | POA: Diagnosis not present

## 2017-11-01 DIAGNOSIS — Z23 Encounter for immunization: Secondary | ICD-10-CM

## 2017-11-01 DIAGNOSIS — M545 Low back pain, unspecified: Secondary | ICD-10-CM

## 2017-11-01 NOTE — Assessment & Plan Note (Signed)
Continues to be multifactorial.  Patient is overweight, multiple different medical problems.  Still concern for seronegative rheumatoid arthritis with family history and patient's presenting symptoms.  Patient feels like she is making progress since her dental surgery.  Discussed icing regimen and home exercises again.  Discussed importance of diet.  Follow-up again in 4 to 6 weeks

## 2017-11-01 NOTE — Assessment & Plan Note (Signed)
Decision today to treat with OMT was based on Physical Exam  After verbal consent patient was treated with HVLA, ME, FPR techniques in cervical, thoracic, rib, lumbar and sacral areas  Patient tolerated the procedure well with improvement in symptoms  Patient given exercises, stretches and lifestyle modifications  See medications in patient instructions if given  Patient will follow up in 4-6 weeks 

## 2017-11-02 ENCOUNTER — Encounter: Payer: Self-pay | Admitting: Family Medicine

## 2017-11-09 ENCOUNTER — Encounter: Payer: Self-pay | Admitting: Family Medicine

## 2017-11-27 ENCOUNTER — Telehealth: Payer: Self-pay | Admitting: Obstetrics and Gynecology

## 2017-11-27 NOTE — Telephone Encounter (Signed)
Rose spoke with patient 11/27/17 and rescheduled ultrasound appointment to 12/20/17 with Dr Quincy Simmonds. Patient is aware of appointment date, arrival time and cancellation policy. Will close encounter

## 2017-11-27 NOTE — Telephone Encounter (Signed)
Patient called after hours and left a message cancelling her ultrasound and office visit on 11/29/17 with Dr. Quincy Simmonds. She'd like to reschedule.   Routing to Rosa/Suzy

## 2017-11-29 ENCOUNTER — Other Ambulatory Visit: Payer: Medicare Other

## 2017-11-29 ENCOUNTER — Other Ambulatory Visit: Payer: Medicare Other | Admitting: Obstetrics and Gynecology

## 2017-11-29 ENCOUNTER — Encounter: Payer: Self-pay | Admitting: Family Medicine

## 2017-11-29 ENCOUNTER — Ambulatory Visit (INDEPENDENT_AMBULATORY_CARE_PROVIDER_SITE_OTHER): Payer: Medicare Other | Admitting: Family Medicine

## 2017-11-29 VITALS — BP 150/84 | HR 101 | Ht 60.0 in | Wt 236.0 lb

## 2017-11-29 DIAGNOSIS — M545 Low back pain, unspecified: Secondary | ICD-10-CM

## 2017-11-29 DIAGNOSIS — M170 Bilateral primary osteoarthritis of knee: Secondary | ICD-10-CM

## 2017-11-29 DIAGNOSIS — G8929 Other chronic pain: Secondary | ICD-10-CM

## 2017-11-29 DIAGNOSIS — M999 Biomechanical lesion, unspecified: Secondary | ICD-10-CM | POA: Diagnosis not present

## 2017-11-29 MED ORDER — DOXYCYCLINE HYCLATE 100 MG PO TABS: 100.0000 mg | ORAL_TABLET | Freq: Two times a day (BID) | ORAL | 0 refills | Status: AC

## 2017-11-29 NOTE — Patient Instructions (Signed)
Good to see you   Stop your multi-vitamin Consider the doxyccyline 2 times a day for 21 days. This would be the osteomyelitis.  Monovisc injections and we will try to get it soon and call you.  Otherwise see me again in 4ish weeks (30 minute appointment  Please)

## 2017-11-29 NOTE — Progress Notes (Signed)
[name], [place] [address] Phone: [phone] Subjective:    I Kerry [name] am serving as a Education administrator for Dr. Hulan Saas.   CC: Back pain and polyarthralgia follow-up  BRA:[id number]  Kerry [name] is a 67 y.o. female coming in with complaint of back pain. States that the back is still giving her issues. Would like to talk about other concerns as well.  Patient continues to have pain.  Did have the implants removed of the teeth.  Has not noticed any significant improvement with this at the moment.  Patient wants to start other different multivitamins but unfortunately found that she was having increasing blood pressure and did not feel good.  Has discontinued those at this time.  Blood pressure is coming down but still elevated.  Denies any headache, chest pain.    Patient does bring in a pathology report from patient's removal of the implants that did show the potential for an osteomyelitis.  Has not been treated.  Past Medical History:  Diagnosis Date  . Abnormal uterine bleeding   . Allergic rhinitis, cause unspecified   . Arthritis of knee, right   . Bursitis of left shoulder   . 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 Chronic pain   . Dyspareunia   . Elevated cholesterol   . Fibroid   . Foot fracture, left 01/2015   and torn tendons  . GERD (gastroesophageal reflux disease)   . HTN (hypertension)   . Kidney stones   . Sleep apnea   . Urinary incontinence    Past Surgical History:  Procedure Laterality Date  .  dilatation and curettage  2000  . CARPAL TUNNEL RELEASE  1998  . CATARACT EXTRACTION Bilateral   . dental implants  2010  . ESOPHAGEAL DILATION  2010  . HAMMER TOE SURGERY     rt  . PELVIC LAPAROSCOPY  1990  . ROTATOR CUFF REPAIR Right 09/2011   Dr. Tamera Punt  . ROTATOR CUFF REPAIR Left 11/11/13   Dr. Tamera Punt   . SEPTOPLASTY    . UMBILICAL HERNIA REPAIR  2010   Social History   Socioeconomic  History  . Marital status: Married    Spouse name: Not on file  . Number of children: Not on file  . Years of education: Not on file  . Highest education level: Not on file  Occupational History  . Occupation: Retired  Scientific laboratory technician  . Financial resource strain: Not on file  . Food insecurity:    Worry: Not on file    Inability: Not on file  . Transportation needs:    Medical: Not on file    Non-medical: Not on file  Tobacco Use  . Smoking status: Never Smoker  . Smokeless tobacco: Never Used  Substance and Sexual Activity  . Alcohol use: No    Alcohol/week: 0.0 standard drinks  . Drug use: No  . Sexual activity: Never    Partners: Male    Birth control/protection: Post-menopausal  Lifestyle  . Physical activity:    Days per week: Not on file    Minutes per session: Not on file  . Stress: Not on file  Relationships  . Social connections:    Talks on phone: Not on file    Gets together: Not on file    Attends religious service: Not on file    Active member of club or organization: Not on file    Attends meetings  of clubs or organizations: Not on file    Relationship status: Not on file  Other Topics Concern  . Not on file  Social History Narrative  . Not on file   Allergies  Allergen Reactions  . Molds & Smuts Other (See Comments), Palpitations, Shortness Of Breath and Swelling  . Cefixime Swelling  . Celebrex [Celecoxib] Swelling  . Cobalt Other (See Comments)    Per allergist  . Gabapentin Other (See Comments)    Joint pain  . Levofloxacin Other (See Comments)    Muscle and joint pain  . Molybdenum Other (See Comments)    Per allergist  . Other Other (See Comments)    TEQUIN. TEQUIN - JOINT AND MUSCLE PAIN Other Reaction: painful joints TEQUIN. TEQUIN - JOINT AND MUSCLE PAIN Tantal Metal  . Penicillins Hives  . Robaxin [Methocarbamol]   . Tomato Hives  . Adhesive [Tape] Rash    Sensitive to EKG leads; sensitive to 32M Micropore tape  . Dust Mite  Extract Itching, Other (See Comments) and Palpitations  . Tetracycline Hcl Rash    Can take Doxy  . Tetracyclines & Related Rash   Family History  Problem Relation Age of Onset  . Rheumatologic disease Mother   . Diabetes Mother   . Heart attack Father   . Rheumatologic disease Sister   . Arthritis Sister        --Rheumatoid arthritis  . Ovarian cancer Sister 51  . Crohn's disease Brother   . Kidney failure Brother   . Ulcerative colitis Brother   . Glaucoma Brother         Current Outpatient Medications (Hematological):  Marland Kitchen  Cyanocobalamin (B-12) 1000 MCG/ML KIT, Inject 1 Dose as directed every 14 (fourteen) days. (Patient taking differently: Inject 1 Dose as directed every 21 ( twenty-one) days. )  Current Outpatient Medications (Other):  Marland Kitchen  Cholecalciferol (VITAMIN D3) 5000 units CAPS, Take 5,000 Units by mouth daily.  .  Multiple Vitamins-Minerals (PHYTOMULTI PO), Take by mouth. By mouth every other day .  Probiotic Product (PROBIOTIC ADVANCED PO), Take by mouth. Patient states taking 5 billion, one capsule daily. .  Pyridoxine HCl (B-6) 100 MG TABS,  .  vitamin C (ASCORBIC ACID) 500 MG tablet, Take 500 mg by mouth 2 (two) times daily.  Marland Kitchen  doxycycline (VIBRA-TABS) 100 MG tablet, Take 1 tablet (100 mg total) by mouth 2 (two) times daily for 21 days.    Past medical history, social, surgical and family history all reviewed in electronic medical record.  No pertanent information unless stated regarding to the chief complaint.   Review of Systems:  No headache, visual changes, nausea, vomiting, diarrhea, constipation, dizziness, abdominal pain, skin rash, fevers, chills, night sweats, weight loss, swollen lymph nodes, body aches, joint swelling,  chest pain, shortness of breath, mood changes.  Positive muscle aches  Objective  Blood pressure (!) 150/84, pulse (!) 101, height 5' (1.524 m), weight 236 lb (107 kg), last menstrual period 01/16/2002, SpO2 98 %.f    General: No  apparent distress alert and oriented x3 mood and affect normal, dressed appropriately.  HEENT: Pupils equal, extraocular movements intact  Respiratory: Patient's speak in full sentences and does not appear short of breath  Cardiovascular: 2+lower extremity edema, non tender, no erythema  Skin: Warm dry intact with no signs of infection or rash on extremities or on axial skeleton.  Abdomen: Soft nontender  Neuro: Cranial nerves II through XII are intact, neurovascularly intact in all extremities with 2+  DTRs and 2+ pulses.  Lymph: No lymphadenopathy of posterior or anterior cervical chain or axillae bilaterally.  Gait severely antalgic MSK:  tender with limited range of motion and stability and strength and tone of shoulders, elbows, wrist, hip, knee and ankles bilaterally.  Arthritic changes moderate to severe multiple joints.  Knee: Bilateral valgus deformity noted. Large thigh to calf ratio.  Tender to palpation over medial and PF joint line.  ROM full in flexion and extension and lower leg rotation. instability with valgus force.  painful patellar compression. Patellar glide with moderate crepitus. Patellar and quadriceps tendons unremarkable. Hamstring and quadriceps strength is normal.   Back exam does show some significant changes as well with degenerative scoliosis.  Patient has severe tenderness to palpation of the paraspinal musculature from the cervical down to the lumbar spine.  Worsening symptoms overall.  Unable to do [place] secondary to tightness and body habitus.  Osteopathic findings  C6 flexed rotated and side bent left T3 extended rotated and side bent right inhaled third rib L2 flexed rotated and side bent right Sacrum right on right     Impression and Recommendations:     This case required medical decision making of moderate complexity. The above documentation has been reviewed and is accurate and complete Kerry Pulley, DO       Note: This dictation  was prepared with Dragon dictation along with smaller phrase technology. Any transcriptional errors that result from this process are unintentional.

## 2017-11-30 ENCOUNTER — Encounter: Payer: Self-pay | Admitting: Family Medicine

## 2017-11-30 NOTE — Assessment & Plan Note (Signed)
Has done decent with viscosupplementation in the past.  And wants to avoid any type of replacement.  We will get approval for potential Monovisc.  Depending on this will discuss then other treatment options if this does not work.

## 2017-11-30 NOTE — Assessment & Plan Note (Signed)
Low back pain overall.  Discussed with patient and multifactorial.  Still think there is a very strong possibility for a seronegative rheumatoid arthritis.  Patient has been resistant to any change in medications at this time.  Patient was put on doxycycline for the reported osteomyelitis of the jaw on pathological report of patient's implant removal.  Patient responded somewhat to osteopathic manipulation.  Patient wants to continue conservative therapy otherwise.  Follow-up with me again in 4 to 6 weeks

## 2017-11-30 NOTE — Assessment & Plan Note (Signed)
Decision today to treat with OMT was based on Physical Exam  After verbal consent patient was treated with HVLA, ME, FPR techniques in cervical, thoracic, rib lumbar and sacral areas  Patient tolerated the procedure well with improvement in symptoms  Patient given exercises, stretches and lifestyle modifications  See medications in patient instructions if given  Patient will follow up in 4 weeks 

## 2017-12-20 ENCOUNTER — Ambulatory Visit (INDEPENDENT_AMBULATORY_CARE_PROVIDER_SITE_OTHER): Payer: Medicare Other | Admitting: Obstetrics and Gynecology

## 2017-12-20 ENCOUNTER — Other Ambulatory Visit: Payer: Self-pay

## 2017-12-20 ENCOUNTER — Ambulatory Visit (INDEPENDENT_AMBULATORY_CARE_PROVIDER_SITE_OTHER): Payer: Medicare Other | Admitting: Family Medicine

## 2017-12-20 ENCOUNTER — Encounter: Payer: Self-pay | Admitting: Obstetrics and Gynecology

## 2017-12-20 ENCOUNTER — Encounter: Payer: Self-pay | Admitting: Family Medicine

## 2017-12-20 ENCOUNTER — Ambulatory Visit (INDEPENDENT_AMBULATORY_CARE_PROVIDER_SITE_OTHER): Payer: Medicare Other

## 2017-12-20 VITALS — BP 200/98 | HR 100 | Ht 60.0 in | Wt 237.8 lb

## 2017-12-20 DIAGNOSIS — D219 Benign neoplasm of connective and other soft tissue, unspecified: Secondary | ICD-10-CM

## 2017-12-20 DIAGNOSIS — D215 Benign neoplasm of connective and other soft tissue of pelvis: Secondary | ICD-10-CM

## 2017-12-20 DIAGNOSIS — Z8041 Family history of malignant neoplasm of ovary: Secondary | ICD-10-CM

## 2017-12-20 DIAGNOSIS — M170 Bilateral primary osteoarthritis of knee: Secondary | ICD-10-CM | POA: Diagnosis not present

## 2017-12-20 NOTE — Progress Notes (Signed)
Encounter reviewed by Dr. Brook Amundson C. Silva.  

## 2017-12-20 NOTE — Assessment & Plan Note (Signed)
Bilateral injections given today.  Patient will need to monitor for any type of side effects.  Has had difficulty with many previously in the past.  Patient wants to avoid any surgical intervention.  Discussed the possibility of Visco supplementation again.  Patient will consider.  Follow-up again in 4 to 8 weeks

## 2017-12-20 NOTE — Progress Notes (Signed)
GYNECOLOGY  VISIT   HPI: 67 y.o.   Married  Caucasian  female   G2P2002 with Patient's last menstrual period was 01/16/2002.   here for pelvic ultrasound.   FH ovarian cancer.    Patient with known fibroids.   GYNECOLOGIC HISTORY: Patient's last menstrual period was 01/16/2002. Contraception: Postmenopausal Menopausal hormone therapy:  none Last mammogram:   07-26-17 density c/BIRADS 1 negative  Last pap smear: 07-28-16 negative            6-8- 16 negative         OB History    Gravida  2   Para  2   Term  2   Preterm      AB      Living  2     SAB      TAB      Ectopic      Multiple      Live Births                 Patient Active Problem List   Diagnosis Date Noted  . Nonallopathic lesion of thoracic region 08/02/2017  . Swelling of both lower extremities 01/18/2017  . Nonallopathic lesion of sacral region 01/03/2017  . Allergic contact dermatitis due to metals 12/16/2016  . Abnormal gait 04/10/2016  . Dyslipidemia 08/12/2015  . Dyspnea on exertion 08/12/2015  . Palpitations 08/12/2015  . Pes anserine bursitis 08/10/2015  . Rupture of left posterior tibialis tendon 03/25/2015  . OSA (obstructive sleep apnea) 03/05/2015  . Navicular fracture of ankle 01/21/2015  . Sinus infection 12/25/2014  . Uric acid arthropathy 03/23/2014  . High plasma oxalate 02/17/2014  . Nonallopathic lesion of cervical region 12/19/2012  . Fibroids 05/28/2012  . Family history of ovarian cancer 05/28/2012  . Strain of quadriceps muscle 04/17/2012  . Nonallopathic lesion of costovertebral region 01/23/2012  . Left shoulder pain 12/20/2011  . Rotator cuff disorder 09/26/2011  . Bilateral foot pain 09/20/2011  . Arthralgia of multiple joints 07/26/2011  . Arthritis of knee, degenerative 07/26/2011  . Arthritis of hand, degenerative 07/26/2011  . Low back pain 07/26/2011  . Vitamin B 12 deficiency 03/05/2011  . TMJ (temporomandibular joint syndrome) 03/05/2011  . Pes  planus 03/01/2011  . Right knee pain 03/01/2011  . Greater trochanteric bursitis of left hip 03/01/2011  . Nonallopathic lesion of lumbosacral region 01/27/2011  . Obesity 12/29/2010  . Fatigue 12/29/2010  . Allergic rhinitis, cause unspecified   . Chronic pain   . GERD (gastroesophageal reflux disease)   . Sleep apnea     Past Medical History:  Diagnosis Date  . Abnormal uterine bleeding   . Allergic rhinitis, cause unspecified   . Arthritis of knee, right   . Bursitis of left shoulder   . 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 Chronic pain   . Dyspareunia   . Elevated cholesterol   . Fibroid   . Foot fracture, left 01/2015   and torn tendons  . GERD (gastroesophageal reflux disease)   . HTN (hypertension)   . Kidney stones   . Sleep apnea   . Urinary incontinence     Past Surgical History:  Procedure Laterality Date  .  dilatation and curettage  2000  . CARPAL TUNNEL RELEASE  1998  . CATARACT EXTRACTION Bilateral   . dental implants  2010  . ESOPHAGEAL DILATION  2010  . HAMMER TOE SURGERY     rt  . PELVIC LAPAROSCOPY  1990  .  ROTATOR CUFF REPAIR Right 09/2011   Dr. Tamera Punt  . ROTATOR CUFF REPAIR Left 11/11/13   Dr. Tamera Punt   . SEPTOPLASTY    . UMBILICAL HERNIA REPAIR  2010    Current Outpatient Medications  Medication Sig Dispense Refill  . Cholecalciferol (VITAMIN D3) 5000 units CAPS Take 5,000 Units by mouth daily.     . Cyanocobalamin (B-12) 1000 MCG/ML KIT Inject 1 Dose as directed every 14 (fourteen) days. (Patient taking differently: Inject 1 Dose as directed every 21 ( twenty-one) days. ) 12 kit 3  . doxycycline (VIBRA-TABS) 100 MG tablet Take 1 tablet (100 mg total) by mouth 2 (two) times daily for 21 days. 42 tablet 0  . Multiple Vitamins-Minerals (PHYTOMULTI PO) Take by mouth. By mouth every other day    . Probiotic Product (PROBIOTIC ADVANCED PO) Take by mouth. Patient states taking 5 billion, one capsule daily.    . Pyridoxine HCl (B-6) 100 MG  TABS     . vitamin C (ASCORBIC ACID) 500 MG tablet Take 500 mg by mouth 2 (two) times daily.      No current facility-administered medications for this visit.      ALLERGIES: Molds & smuts; Cefixime; Celebrex [celecoxib]; Cobalt; Gabapentin; Levofloxacin; Molybdenum; Other; Penicillins; Robaxin [methocarbamol]; Tomato; Adhesive [tape]; Dust mite extract; Tetracycline hcl; and Tetracyclines & related  Family History  Problem Relation Age of Onset  . Rheumatologic disease Mother   . Diabetes Mother   . Heart attack Father   . Rheumatologic disease Sister   . Arthritis Sister        --Rheumatoid arthritis  . Ovarian cancer Sister 22  . Crohn's disease Brother   . Kidney failure Brother   . Ulcerative colitis Brother   . Glaucoma Brother     Social History   Socioeconomic History  . Marital status: Married    Spouse name: Not on file  . Number of children: Not on file  . Years of education: Not on file  . Highest education level: Not on file  Occupational History  . Occupation: Retired  Scientific laboratory technician  . Financial resource strain: Not on file  . Food insecurity:    Worry: Not on file    Inability: Not on file  . Transportation needs:    Medical: Not on file    Non-medical: Not on file  Tobacco Use  . Smoking status: Never Smoker  . Smokeless tobacco: Never Used  Substance and Sexual Activity  . Alcohol use: No    Alcohol/week: 0.0 standard drinks  . Drug use: No  . Sexual activity: Never    Partners: Male    Birth control/protection: Post-menopausal  Lifestyle  . Physical activity:    Days per week: Not on file    Minutes per session: Not on file  . Stress: Not on file  Relationships  . Social connections:    Talks on phone: Not on file    Gets together: Not on file    Attends religious service: Not on file    Active member of club or organization: Not on file    Attends meetings of clubs or organizations: Not on file    Relationship status: Not on file  .  Intimate partner violence:    Fear of current or ex partner: Not on file    Emotionally abused: Not on file    Physically abused: Not on file    Forced sexual activity: Not on file  Other Topics Concern  . Not  on file  Social History Narrative  . Not on file    Review of Systems  All other systems reviewed and are negative.   PHYSICAL EXAMINATION:    BP (!) 200/98 (BP Location: Right Arm, Patient Position: Sitting, Cuff Size: Large)   Pulse 100   Ht 5' (1.524 m)   Wt 237 lb 12.8 oz (107.9 kg)   LMP 01/16/2002   BMI 46.44 kg/m     General appearance: alert, cooperative and appears stated age  Pelvic US Fibroids - stable.  Uterine and cervical fibroid. EMS 3.03 mm. Ovaries - atrophic and normal. No free fluid.  ASSESSMENT  FH ovarian cancer.  Normal ovaries on ultrasound.  Known fibroids.   PLAN  Reassurance regarding pelvic US appearance.  Will check CA125.  Signs and symptoms of ovarian cancer reviewed. Continue yearly pelvic US.     An After Visit Summary was printed and given to the patient.  __15____ minutes face to face time of which over 50% was spent in counseling.

## 2017-12-20 NOTE — Patient Instructions (Signed)
Good to see you  Injected both knees Really hope we do not have a flare See me again as we scheduled

## 2017-12-20 NOTE — Progress Notes (Signed)
[name] [address] [place], [place] [address] Phone: [phone] Subjective:    I Kerry [name] am serving as a Education administrator for Dr. Hulan Saas.   I'm seeing this patient by the request  of:    CC: Knee pain  [id number]  Kerry [name] is a 67 y.o. female coming in with complaint of knee pain. Wants a knee injection today.  Patient has many different comorbidities.  Noted to have arthritic changes of the knees bilaterally.  Avoiding knee replacement secondary to potential allergy to knee replacement.      Past Medical History:  Diagnosis Date  . Abnormal uterine bleeding   . Allergic rhinitis, cause unspecified   . Arthritis of knee, right   . Bursitis of left shoulder   . 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 Chronic pain   . Dyspareunia   . Elevated cholesterol   . Fibroid   . Foot fracture, left 01/2015   and torn tendons  . GERD (gastroesophageal reflux disease)   . HTN (hypertension)   . Kidney stones   . Sleep apnea   . Urinary incontinence    Past Surgical History:  Procedure Laterality Date  .  dilatation and curettage  2000  . CARPAL TUNNEL RELEASE  1998  . CATARACT EXTRACTION Bilateral   . dental implants  2010  . ESOPHAGEAL DILATION  2010  . HAMMER TOE SURGERY     rt  . PELVIC LAPAROSCOPY  1990  . ROTATOR CUFF REPAIR Right 09/2011   Dr. Tamera Punt  . ROTATOR CUFF REPAIR Left 11/11/13   Dr. Tamera Punt   . SEPTOPLASTY    . UMBILICAL HERNIA REPAIR  2010   Social History   Socioeconomic History  . Marital status: Married    Spouse name: Not on file  . Number of children: Not on file  . Years of education: Not on file  . Highest education level: Not on file  Occupational History  . Occupation: Retired  Scientific laboratory technician  . Financial resource strain: Not on file  . Food insecurity:    Worry: Not on file    Inability: Not on file  . Transportation needs:    Medical: Not on file    Non-medical: Not on file  Tobacco  Use  . Smoking status: Never Smoker  . Smokeless tobacco: Never Used  Substance and Sexual Activity  . Alcohol use: No    Alcohol/week: 0.0 standard drinks  . Drug use: No  . Sexual activity: Never    Partners: Male    Birth control/protection: Post-menopausal  Lifestyle  . Physical activity:    Days per week: Not on file    Minutes per session: Not on file  . Stress: Not on file  Relationships  . Social connections:    Talks on phone: Not on file    Gets together: Not on file    Attends religious service: Not on file    Active member of club or organization: Not on file    Attends meetings of clubs or organizations: Not on file    Relationship status: Not on file  Other Topics Concern  . Not on file  Social History Narrative  . Not on file   Allergies  Allergen Reactions  . Molds & Smuts Other (See Comments), Palpitations, Shortness Of Breath and Swelling  . Cefixime Swelling  . Celebrex [Celecoxib] Swelling  . Cobalt Other (See Comments)    Per allergist  .  Gabapentin Other (See Comments)    Joint pain  . Levofloxacin Other (See Comments)    Muscle and joint pain  . Molybdenum Other (See Comments)    Per allergist  . Other Other (See Comments)    TEQUIN. TEQUIN - JOINT AND MUSCLE PAIN Other Reaction: painful joints TEQUIN. TEQUIN - JOINT AND MUSCLE PAIN Tantal Metal  . Penicillins Hives  . Robaxin [Methocarbamol]   . Tomato Hives  . Adhesive [Tape] Rash    Sensitive to EKG leads; sensitive to 81M Micropore tape  . Dust Mite Extract Itching, Other (See Comments) and Palpitations  . Tetracycline Hcl Rash    Can take Doxy  . Tetracyclines & Related Rash   Family History  Problem Relation Age of Onset  . Rheumatologic disease Mother   . Diabetes Mother   . Heart attack Father   . Rheumatologic disease Sister   . Arthritis Sister        --Rheumatoid arthritis  . Ovarian cancer Sister 1  . Crohn's disease Brother   . Kidney failure Brother   . Ulcerative  colitis Brother   . Glaucoma Brother         Current Outpatient Medications (Hematological):  Marland Kitchen  Cyanocobalamin (B-12) 1000 MCG/ML KIT, Inject 1 Dose as directed every 14 (fourteen) days. (Patient taking differently: Inject 1 Dose as directed every 21 ( twenty-one) days. )  Current Outpatient Medications (Other):  Marland Kitchen  Cholecalciferol (VITAMIN D3) 5000 units CAPS, Take 5,000 Units by mouth daily.  Marland Kitchen  doxycycline (VIBRA-TABS) 100 MG tablet, Take 1 tablet (100 mg total) by mouth 2 (two) times daily for 21 days. .  Multiple Vitamins-Minerals (PHYTOMULTI PO), Take by mouth. By mouth every other day .  Probiotic Product (PROBIOTIC ADVANCED PO), Take by mouth. Patient states taking 5 billion, one capsule daily. .  Pyridoxine HCl (B-6) 100 MG TABS,  .  vitamin C (ASCORBIC ACID) 500 MG tablet, Take 500 mg by mouth 2 (two) times daily.     Past medical history, social, surgical and family history all reviewed in electronic medical record.  No pertanent information unless stated regarding to the chief complaint.     Objective  Blood pressure (!) 160/82, pulse (!) 107, height 5' (1.524 m), weight 238 lb (108 kg), last menstrual period 01/16/2002, SpO2 97 %. Systems examined below as of    General: No apparent distress alert and oriented x3 mood and affect normal, dressed appropriately.  HEENT: Pupils equal, extraocular movements intact  Respiratory: Patient's speak in full sentences and does not appear short of breath  Cardiovascular: No lower extremity edema, non tender, no erythema  Skin: Warm dry intact with no signs of infection or rash on extremities or on axial skeleton.  Abdomen: Soft nontender  Neuro: Cranial nerves II through XII are intact, neurovascularly intact in all extremities with 2+ DTRs and 2+ pulses.  Lymph: No lymphadenopathy of posterior or anterior cervical chain or axillae bilaterally.  Gait severely antalgic  Knee: Lateral valgus deformity noted. Large thigh to calf  ratio.  Tender to palpation over medial and PF joint line.  ROM full in flexion and extension and lower leg rotation. instability with valgus force.  painful patellar compression. Patellar glide with moderate crepitus. Patellar and quadriceps tendons unremarkable. Hamstring and quadriceps strength is normal.  After informed written and verbal consent, patient was seated on exam table. Right knee was prepped with alcohol swab and utilizing anterolateral approach, patient's right knee space was injected with 4:1  marcaine 0.5%: Kenalog 27m/dL. Patient tolerated the procedure well without immediate complications.  After informed written and verbal consent, patient was seated on exam table. Left knee was prepped with alcohol swab and utilizing anterolateral approach, patient's left knee space was injected with 4:1  marcaine 0.5%: Kenalog 476mdL. Patient tolerated the procedure well without immediate complications.   Impression and Recommendations:     This case required medical decision making of moderate complexity. The above documentation has been reviewed and is accurate and complete Kerry PulleyDO       Note: This dictation was prepared with Dragon dictation along with smaller phrase technology. Any transcriptional errors that result from this process are unintentional.

## 2017-12-21 LAB — CA 125: CANCER ANTIGEN (CA) 125: 5.9 U/mL (ref 0.0–38.1)

## 2017-12-26 ENCOUNTER — Encounter: Payer: Self-pay | Admitting: Family Medicine

## 2017-12-27 ENCOUNTER — Ambulatory Visit (INDEPENDENT_AMBULATORY_CARE_PROVIDER_SITE_OTHER): Payer: Medicare Other | Admitting: Family Medicine

## 2017-12-27 ENCOUNTER — Ambulatory Visit (INDEPENDENT_AMBULATORY_CARE_PROVIDER_SITE_OTHER)
Admission: RE | Admit: 2017-12-27 | Discharge: 2017-12-27 | Disposition: A | Discharge status: Home / Self Care | Payer: Medicare Other | Source: Ambulatory Visit | Attending: Family Medicine | Admitting: Family Medicine

## 2017-12-27 ENCOUNTER — Encounter: Payer: Self-pay | Admitting: Family Medicine

## 2017-12-27 VITALS — BP 148/84 | HR 95 | Ht 60.0 in | Wt 237.0 lb

## 2017-12-27 DIAGNOSIS — M545 Low back pain, unspecified: Secondary | ICD-10-CM

## 2017-12-27 DIAGNOSIS — M5416 Radiculopathy, lumbar region: Secondary | ICD-10-CM

## 2017-12-27 MED ORDER — NORTRIPTYLINE HCL 10 MG PO CAPS: 10.0000 mg | ORAL_CAPSULE | Freq: Every day | ORAL | 1 refills | Status: DC

## 2017-12-27 NOTE — Patient Instructions (Addendum)
Good to see you  Ice is your friend xrays downstairs Nortriptyline 10 mg at night I really hope this helaps If it worsens we will need to consider prednisone Send me a message on what PT you want [place] to use.  See me again when you come back

## 2017-12-27 NOTE — Progress Notes (Signed)
[name] [place], [place] [address] Phone: [phone] Subjective:    I, Wendy Poet PT, LAT, ATC, am serving as scribe for [employer], DO.    CC: Back pain and bilateral knee pain follow-up  [id number]  Kerry [name] is a 67 y.o. female coming in with complaint of B knee pain.  She was last seen on 12/20/17 for her knees but states that she has started having L thigh pain.  She states that she first had R lower back and abdominal pain that was then followed by L thigh pain the next day.  Pt states that she is currently having L-sided back pain radiating into her L thigh.  She denies any N/T in her L thigh.   Location- L lower back radiating into L thigh Duration- constant Character-  Sharp, aching, throbbing, burning pain that she rates as a 6-7/10 Aggravating factors-  Standing, transitioning from sit-to-stand Reliving factors- alternating ice and heat Therapies tried- ice and heat Severity- 6-7/10     Past Medical History:  Diagnosis Date  . Abnormal uterine bleeding   . Allergic rhinitis, cause unspecified   . Arthritis of knee, right   . Bursitis of left shoulder   . 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 Chronic pain   . Dyspareunia   . Elevated cholesterol   . Fibroid   . Foot fracture, left 01/2015   and torn tendons  . GERD (gastroesophageal reflux disease)   . HTN (hypertension)   . Kidney stones   . Sleep apnea   . Urinary incontinence    Past Surgical History:  Procedure Laterality Date  .  dilatation and curettage  2000  . CARPAL TUNNEL RELEASE  1998  . CATARACT EXTRACTION Bilateral   . dental implants  2010  . ESOPHAGEAL DILATION  2010  . HAMMER TOE SURGERY     rt  . PELVIC LAPAROSCOPY  1990  . ROTATOR CUFF REPAIR Right 09/2011   Dr. Tamera Punt  . ROTATOR CUFF REPAIR Left 11/11/13   Dr. Tamera Punt   . SEPTOPLASTY    . UMBILICAL HERNIA REPAIR  2010   Social History   Socioeconomic History  .  Marital status: Married    Spouse name: Not on file  . Number of children: Not on file  . Years of education: Not on file  . Highest education level: Not on file  Occupational History  . Occupation: Retired  Scientific laboratory technician  . Financial resource strain: Not on file  . Food insecurity:    Worry: Not on file    Inability: Not on file  . Transportation needs:    Medical: Not on file    Non-medical: Not on file  Tobacco Use  . Smoking status: Never Smoker  . Smokeless tobacco: Never Used  Substance and Sexual Activity  . Alcohol use: No    Alcohol/week: 0.0 standard drinks  . Drug use: No  . Sexual activity: Never    Partners: Male    Birth control/protection: Post-menopausal  Lifestyle  . Physical activity:    Days per week: Not on file    Minutes per session: Not on file  . Stress: Not on file  Relationships  . Social connections:    Talks on phone: Not on file    Gets together: Not on file    Attends religious service: Not on file    Active member of club or organization: Not on file  Attends meetings of clubs or organizations: Not on file    Relationship status: Not on file  Other Topics Concern  . Not on file  Social History Narrative  . Not on file   Allergies  Allergen Reactions  . Molds & Smuts Other (See Comments), Palpitations, Shortness Of Breath and Swelling  . Cefixime Swelling  . Celebrex [Celecoxib] Swelling  . Cobalt Other (See Comments)    Per allergist  . Gabapentin Other (See Comments)    Joint pain  . Levofloxacin Other (See Comments)    Muscle and joint pain  . Molybdenum Other (See Comments)    Per allergist  . Other Other (See Comments)    TEQUIN. TEQUIN - JOINT AND MUSCLE PAIN Other Reaction: painful joints TEQUIN. TEQUIN - JOINT AND MUSCLE PAIN Tantal Metal  . Penicillins Hives  . Robaxin [Methocarbamol]   . Tomato Hives  . Adhesive [Tape] Rash    Sensitive to EKG leads; sensitive to 57M Micropore tape  . Dust Mite Extract Itching,  Other (See Comments) and Palpitations  . Tetracycline Hcl Rash    Can take Doxy  . Tetracyclines & Related Rash   Family History  Problem Relation Age of Onset  . Rheumatologic disease Mother   . Diabetes Mother   . Heart attack Father   . Rheumatologic disease Sister   . Arthritis Sister        --Rheumatoid arthritis  . Ovarian cancer Sister 24  . Crohn's disease Brother   . Kidney failure Brother   . Ulcerative colitis Brother   . Glaucoma Brother         Current Outpatient Medications (Hematological):  Marland Kitchen  Cyanocobalamin (B-12) 1000 MCG/ML KIT, Inject 1 Dose as directed every 14 (fourteen) days. (Patient taking differently: Inject 1 Dose as directed every 21 ( twenty-one) days. )  Current Outpatient Medications (Other):  Marland Kitchen  Cholecalciferol (VITAMIN D3) 5000 units CAPS, Take 5,000 Units by mouth daily.  .  Multiple Vitamins-Minerals (PHYTOMULTI PO), Take by mouth. By mouth every other day .  Probiotic Product (PROBIOTIC ADVANCED PO), Take by mouth. Patient states taking 5 billion, one capsule daily. .  Pyridoxine HCl (B-6) 100 MG TABS,  .  vitamin C (ASCORBIC ACID) 500 MG tablet, Take 500 mg by mouth 2 (two) times daily.  .  nortriptyline (PAMELOR) 10 MG capsule, Take 1 capsule (10 mg total) by mouth at bedtime.    Past medical history, social, surgical and family history all reviewed in electronic medical record.  No pertanent information unless stated regarding to the chief complaint.   Review of Systems:  No headache, visual changes, nausea, vomiting, diarrhea, constipation, dizziness, abdominal pain, skin rash, fevers, chills, night sweats, weight loss, swollen lymph nodes, body aches, joint swelling, , chest pain, shortness of breath, mood changes.  Positive muscle aches  Objective  Blood pressure (!) 148/84, pulse 95, height 5' (1.524 m), weight 237 lb (107.5 kg), last menstrual period 01/16/2002, SpO2 96 %.    General: No apparent distress alert and oriented x3  mood and affect normal, dressed appropriately.  HEENT: Pupils equal, extraocular movements intact  Respiratory: Patient's speak in full sentences and does not appear short of breath  Cardiovascular: No lower extremity edema, non tender, no erythema  Skin: Warm dry intact with no signs of infection or rash on extremities or on axial skeleton.  Abdomen: Soft nontender  Neuro: Cranial nerves II through XII are intact, neurovascularly intact in all extremities with 2+ pulses.  Lymph:  No lymphadenopathy of posterior or anterior cervical chain or axillae bilaterally.  Gait severely antalgic weakness of the left leg noted MSK:  tender with limited range of motion and stability symmetric  and tone of shoulders, elbows, wrist, hip, knee and ankles bilaterally.  Weakness of the left lower extremity noted today.  Patient's back exam has significant loss of lordosis.  Positive straight leg test.  Patient does have weakness left lower extremity.  Patient has weakness with plantar and dorsiflexion.  Patient unable to do Corky Sox secondary to body habitus.  Patient's extensor mechanism is intact.  Left hip does have decreased range of motion muscles appears to be intact.  Deep tendon reflexes 1+ at the patella and Achilles compared to the contralateral side    Impression and Recommendations:     This case required medical decision making of moderate complexity. The above documentation has been reviewed and is accurate and complete [name], DO       Note: This dictation was prepared with Dragon dictation along with smaller phrase technology. Any transcriptional errors that result from this process are unintentional.

## 2017-12-28 ENCOUNTER — Encounter: Payer: Self-pay | Admitting: Family Medicine

## 2017-12-28 DIAGNOSIS — M5416 Radiculopathy, lumbar region: Secondary | ICD-10-CM | POA: Insufficient documentation

## 2017-12-28 NOTE — Assessment & Plan Note (Signed)
Left lumbar radiculopathy.  Discussed icing regimen and home exercise.  Discussed which activities to do which wants to avoid.  We discussed prednisone but patient has had difficulty with this with allergies in the past.  We discussed anti-inflammatories.  Cath gabapentin.  We will start physical therapy.  New x-rays ordered today.  Has had known degenerative disc disease.  Follow-up again 2 weeks

## 2018-01-07 ENCOUNTER — Encounter: Payer: Self-pay | Admitting: Family Medicine

## 2018-01-07 MED ORDER — DOXYCYCLINE HYCLATE 100 MG PO TABS: 100.0000 mg | ORAL_TABLET | Freq: Two times a day (BID) | ORAL | 0 refills | Status: AC

## 2018-01-17 DIAGNOSIS — H26493 Other secondary cataract, bilateral: Secondary | ICD-10-CM | POA: Diagnosis not present

## 2018-01-17 DIAGNOSIS — H35373 Puckering of macula, bilateral: Secondary | ICD-10-CM | POA: Diagnosis not present

## 2018-01-17 DIAGNOSIS — H04123 Dry eye syndrome of bilateral lacrimal glands: Secondary | ICD-10-CM | POA: Diagnosis not present

## 2018-01-20 NOTE — Progress Notes (Signed)
[name] [place], [place] [address] Phone: [phone] Subjective:    I Kerry [name] am serving as a Education administrator for Dr. Hulan Saas.   CC: Back pain and polyarthralgia follow-up  Kerry [name] is a 68 y.o. female coming in with complaint of back and knee pain. States that she is doing better. Has been using ice, heat and epsom salt. PT started today evaluation only. Has body/joint pain since the removal of dental implants. Around Christmas 2 bone fragments worked their way out. Most of the joint pain is gone. Patient states that overall some of her joint pain seems to be improving.  Back pain is significantly better.  Was having lumbar radiculopathy at last exam.  Patient does not have any other weakness or numbness in the leg anymore.  Feeling like she is making some positive improvement      Past Medical History:  Diagnosis Date  . Abnormal uterine bleeding   . Allergic rhinitis, cause unspecified   . Arthritis of knee, right   . Bursitis of left shoulder   . 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 Chronic pain   . Dyspareunia   . Elevated cholesterol   . Fibroid   . Foot fracture, left 01/2015   and torn tendons  . GERD (gastroesophageal reflux disease)   . HTN (hypertension)   . Kidney stones   . Sleep apnea   . Urinary incontinence    Past Surgical History:  Procedure Laterality Date  .  dilatation and curettage  2000  . CARPAL TUNNEL RELEASE  1998  . CATARACT EXTRACTION Bilateral   . dental implants  2010  . ESOPHAGEAL DILATION  2010  . HAMMER TOE SURGERY     rt  . PELVIC LAPAROSCOPY  1990  . ROTATOR CUFF REPAIR Right 09/2011   Dr. Tamera Punt  . ROTATOR CUFF REPAIR Left 11/11/13   Dr. Tamera Punt   . SEPTOPLASTY    . UMBILICAL HERNIA REPAIR  2010   Social History   Socioeconomic History  . Marital status: Married    Spouse name: Not on file  . Number of children: Not on file  . Years of education:  Not on file  . Highest education level: Not on file  Occupational History  . Occupation: Retired  Scientific laboratory technician  . Financial resource strain: Not on file  . Food insecurity:    Worry: Not on file    Inability: Not on file  . Transportation needs:    Medical: Not on file    Non-medical: Not on file  Tobacco Use  . Smoking status: Never Smoker  . Smokeless tobacco: Never Used  Substance and Sexual Activity  . Alcohol use: No    Alcohol/week: 0.0 standard drinks  . Drug use: No  . Sexual activity: Never    Partners: Male    Birth control/protection: Post-menopausal  Lifestyle  . Physical activity:    Days per week: Not on file    Minutes per session: Not on file  . Stress: Not on file  Relationships  . Social connections:    Talks on phone: Not on file    Gets together: Not on file    Attends religious service: Not on file    Active member of club or organization: Not on file    Attends meetings of clubs or organizations: Not on file    Relationship status: Not on file  Other Topics Concern  .  Not on file  Social History Narrative  . Not on file   Allergies  Allergen Reactions  . Molds & Smuts Other (See Comments), Palpitations, Shortness Of Breath and Swelling  . Cefixime Swelling  . Celebrex [Celecoxib] Swelling  . Cobalt Other (See Comments)    Per allergist  . Gabapentin Other (See Comments)    Joint pain  . Levofloxacin Other (See Comments)    Muscle and joint pain  . Molybdenum Other (See Comments)    Per allergist  . Other Other (See Comments)    TEQUIN. TEQUIN - JOINT AND MUSCLE PAIN Other Reaction: painful joints TEQUIN. TEQUIN - JOINT AND MUSCLE PAIN Tantal Metal  . Penicillins Hives  . Robaxin [Methocarbamol]   . Tomato Hives  . Adhesive [Tape] Rash    Sensitive to EKG leads; sensitive to 26M Micropore tape  . Dust Mite Extract Itching, Other (See Comments) and Palpitations  . Tetracycline Hcl Rash    Can take Doxy  . Tetracyclines & Related  Rash   Family History  Problem Relation Age of Onset  . Rheumatologic disease Mother   . Diabetes Mother   . Heart attack Father   . Rheumatologic disease Sister   . Arthritis Sister        --Rheumatoid arthritis  . Ovarian cancer Sister 43  . Crohn's disease Brother   . Kidney failure Brother   . Ulcerative colitis Brother   . Glaucoma Brother     Current Outpatient Medications (Endocrine & Metabolic):  .  prednisoLONE (PRELONE) 15 MG/5ML SOLN, Take 5 mg by mouth daily before breakfast.     Current Outpatient Medications (Hematological):  Marland Kitchen  Cyanocobalamin (B-12) 1000 MCG/ML KIT, Inject 1 Dose as directed every 14 (fourteen) days. (Patient taking differently: Inject 1 Dose as directed every 21 ( twenty-one) days. )  Current Outpatient Medications (Other):  Marland Kitchen  Cholecalciferol (VITAMIN D3) 5000 units CAPS, Take 5,000 Units by mouth daily.  .  Multiple Vitamins-Minerals (PHYTOMULTI PO), Take by mouth. By mouth every third day .  nortriptyline (PAMELOR) 10 MG capsule, Take 1 capsule (10 mg total) by mouth at bedtime. .  Probiotic Product (PROBIOTIC ADVANCED PO), Take by mouth. Patient states taking 5 billion, one capsule daily. .  Pyridoxine HCl (B-6) 100 MG TABS,  .  vitamin C (ASCORBIC ACID) 500 MG tablet, Take 500 mg by mouth 2 (two) times daily.     Past medical history, social, surgical and family history all reviewed in electronic medical record.  No pertanent information unless stated regarding to the chief complaint.   Review of Systems:  No headache, visual changes, nausea, vomiting, diarrhea, constipation, dizziness, abdominal pain, skin rash, fevers, chills, night sweats, weight loss, swollen lymph nodes,, chest pain, shortness of breath, mood changes.  Positive muscle aches and body aches  Objective  Blood pressure (!) 142/84, pulse 98, height 5' (1.524 m), weight 238 lb (108 kg), last menstrual period 01/16/2002, SpO2 98 %.    General: No apparent distress alert  and oriented x3 mood and affect normal, dressed appropriately.  HEENT: Pupils equal, extraocular movements intact  Respiratory: Patient's speak in full sentences and does not appear short of breath  Cardiovascular: No lower extremity edema, non tender, no erythema  Skin: Warm dry intact with no signs of infection or rash on extremities or on axial skeleton.  Abdomen: Soft nontender  Neuro: Cranial nerves II through XII are intact, neurovascularly intact in all extremities with 2+ DTRs and 2+ pulses.  Lymph:  No lymphadenopathy of posterior or anterior cervical chain or axillae bilaterally.  Gait antalgic MSK:  tender with limited range of motion and stability and symmetric strength and tone of shoulders, elbows, wrist, hip, knee and ankles bilaterally. Severe arthritis of the knees.  Back exam shows loss of lordosis.  Patient does have some degenerative changes in the spine noted.  Patient does have some tightness in all planes.  No loss of range of motion in all planes.  Unable to do [place] secondary to tightness.  Negative straight leg test bilaterally.  4+ out of 5 strength in lower extremities bilaterally    Osteopathic findings C4 flexed rotated and side bent left C6 flexed rotated and side bent left T3 extended rotated and side bent right inhaled third rib L3 flexed rotated and side bent right Sacrum right on right    Impression and Recommendations:     This case required medical decision making of moderate complexity. The above documentation has been reviewed and is accurate and complete [name], DO       Note: This dictation was prepared with Dragon dictation along with smaller phrase technology. Any transcriptional errors that result from this process are unintentional.

## 2018-01-21 ENCOUNTER — Encounter: Payer: Self-pay | Admitting: Family Medicine

## 2018-01-21 ENCOUNTER — Ambulatory Visit (INDEPENDENT_AMBULATORY_CARE_PROVIDER_SITE_OTHER): Payer: Medicare Other | Admitting: Family Medicine

## 2018-01-21 VITALS — BP 142/84 | HR 98 | Ht 60.0 in | Wt 238.0 lb

## 2018-01-21 DIAGNOSIS — M545 Low back pain: Secondary | ICD-10-CM | POA: Diagnosis not present

## 2018-01-21 DIAGNOSIS — M999 Biomechanical lesion, unspecified: Secondary | ICD-10-CM | POA: Diagnosis not present

## 2018-01-21 DIAGNOSIS — M5416 Radiculopathy, lumbar region: Secondary | ICD-10-CM

## 2018-01-21 DIAGNOSIS — M5136 Other intervertebral disc degeneration, lumbar region: Secondary | ICD-10-CM | POA: Diagnosis not present

## 2018-01-21 DIAGNOSIS — M6281 Muscle weakness (generalized): Secondary | ICD-10-CM | POA: Diagnosis not present

## 2018-01-21 NOTE — Assessment & Plan Note (Signed)
Left lumbar radiculopathy.  Discussed icing regimen and home exercise.  Discussed which activities to do which wants to avoid.  Patient is to increase activity slowly.  No longer having any difficulty as much with the radicular symptoms and never started the nortriptyline.  Follow-up again in 4 weeks

## 2018-01-21 NOTE — Assessment & Plan Note (Signed)
Decision today to treat with OMT was based on Physical Exam  After verbal consent patient was treated with HVLA, ME, FPR techniques in cervical, thoracic, rib, lumbar and sacral areas  Patient tolerated the procedure well with improvement in symptoms  Patient given exercises, stretches and lifestyle modifications  See medications in patient instructions if given  Patient will follow up in 4-6 weeks 

## 2018-01-21 NOTE — Patient Instructions (Signed)
Good to see you  I am so happy you are doing better Proud of you  See me again in 4-5 weeks

## 2018-01-25 DIAGNOSIS — M545 Low back pain: Secondary | ICD-10-CM | POA: Diagnosis not present

## 2018-01-25 DIAGNOSIS — M6281 Muscle weakness (generalized): Secondary | ICD-10-CM | POA: Diagnosis not present

## 2018-01-25 DIAGNOSIS — M5136 Other intervertebral disc degeneration, lumbar region: Secondary | ICD-10-CM | POA: Diagnosis not present

## 2018-01-29 DIAGNOSIS — M6281 Muscle weakness (generalized): Secondary | ICD-10-CM | POA: Diagnosis not present

## 2018-01-29 DIAGNOSIS — M5136 Other intervertebral disc degeneration, lumbar region: Secondary | ICD-10-CM | POA: Diagnosis not present

## 2018-01-29 DIAGNOSIS — M545 Low back pain: Secondary | ICD-10-CM | POA: Diagnosis not present

## 2018-02-01 DIAGNOSIS — M545 Low back pain: Secondary | ICD-10-CM | POA: Diagnosis not present

## 2018-02-01 DIAGNOSIS — M6281 Muscle weakness (generalized): Secondary | ICD-10-CM | POA: Diagnosis not present

## 2018-02-01 DIAGNOSIS — M5136 Other intervertebral disc degeneration, lumbar region: Secondary | ICD-10-CM | POA: Diagnosis not present

## 2018-02-05 DIAGNOSIS — K056 Periodontal disease, unspecified: Secondary | ICD-10-CM | POA: Diagnosis not present

## 2018-02-05 DIAGNOSIS — T561X4A Toxic effect of mercury and its compounds, undetermined, initial encounter: Secondary | ICD-10-CM | POA: Diagnosis not present

## 2018-02-05 DIAGNOSIS — N200 Calculus of kidney: Secondary | ICD-10-CM | POA: Diagnosis not present

## 2018-02-05 DIAGNOSIS — K589 Irritable bowel syndrome without diarrhea: Secondary | ICD-10-CM | POA: Diagnosis not present

## 2018-02-08 DIAGNOSIS — M5136 Other intervertebral disc degeneration, lumbar region: Secondary | ICD-10-CM | POA: Diagnosis not present

## 2018-02-08 DIAGNOSIS — M545 Low back pain: Secondary | ICD-10-CM | POA: Diagnosis not present

## 2018-02-08 DIAGNOSIS — M6281 Muscle weakness (generalized): Secondary | ICD-10-CM | POA: Diagnosis not present

## 2018-02-14 DIAGNOSIS — M545 Low back pain: Secondary | ICD-10-CM | POA: Diagnosis not present

## 2018-02-14 DIAGNOSIS — M6281 Muscle weakness (generalized): Secondary | ICD-10-CM | POA: Diagnosis not present

## 2018-02-14 DIAGNOSIS — M5136 Other intervertebral disc degeneration, lumbar region: Secondary | ICD-10-CM | POA: Diagnosis not present

## 2018-02-14 DIAGNOSIS — H26492 Other secondary cataract, left eye: Secondary | ICD-10-CM | POA: Diagnosis not present

## 2018-02-18 NOTE — Progress Notes (Signed)
[name] D.O. [facility] [address] [place], [place] [address] Phone: [phone] Subjective:   I, [name], am serving as a scribe for Dr. Zachary Smith.   CC: Back pain follow-up  HPI:Subjective    Update 02/19/2018:  Kerry [name] is a 67 y.o. female coming in with complaint of back pain. Is in physical therapy. Pain with psoas stretching causes abdominal pain and nausea. Continues to have abdominal pain and was told she had a kidney stone recently. Does have an appointment next week with gastro.  Patient continues to have back pain on a regular basis.  Seems to be right-sided.  Been working with physical therapy but having some discomfort all the time and sometimes feels like having more of a spasm.  Patient still was not truly concerned that she is over her dental procedures and could be considering some of the pain.  Has seen patient multiple times and seem to have      Past Medical History:  Diagnosis Date  . Abnormal uterine bleeding   . Allergic rhinitis, cause unspecified   . Arthritis of knee, right   . Bursitis of left shoulder   . 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 Chronic pain   . Dyspareunia   . Elevated cholesterol   . Fibroid   . Foot fracture, left 01/2015   and torn tendons  . GERD (gastroesophageal reflux disease)   . HTN (hypertension)   . Kidney stones   . Sleep apnea   . Urinary incontinence    Past Surgical History:  Procedure Laterality Date  .  dilatation and curettage  2000  . CARPAL TUNNEL RELEASE  1998  . CATARACT EXTRACTION Bilateral   . dental implants  2010  . ESOPHAGEAL DILATION  2010  . HAMMER TOE SURGERY     rt  . PELVIC LAPAROSCOPY  1990  . ROTATOR CUFF REPAIR Right 09/2011   Dr. Chandler  . ROTATOR CUFF REPAIR Left 11/11/13   Dr. Chandler   . SEPTOPLASTY    . UMBILICAL HERNIA REPAIR  2010   Social History   Socioeconomic History  . Marital status: Married    Spouse name: Not on file  . Number of  children: Not on file  . Years of education: Not on file  . Highest education level: Not on file  Occupational History  . Occupation: Retired  Social Needs  . Financial resource strain: Not on file  . Food insecurity:    Worry: Not on file    Inability: Not on file  . Transportation needs:    Medical: Not on file    Non-medical: Not on file  Tobacco Use  . Smoking status: Never Smoker  . Smokeless tobacco: Never Used  Substance and Sexual Activity  . Alcohol use: No    Alcohol/week: 0.0 standard drinks  . Drug use: No  . Sexual activity: Never    Partners: Male    Birth control/protection: Post-menopausal  Lifestyle  . Physical activity:    Days per week: Not on file    Minutes per session: Not on file  . Stress: Not on file  Relationships  . Social connections:    Talks on phone: Not on file    Gets together: Not on file    Attends religious service: Not on file    Active member of club or organization: Not on file    Attends meetings of clubs or organizations: Not on file      Relationship status: Not on file  Other Topics Concern  . Not on file  Social History Narrative  . Not on file   Allergies  Allergen Reactions  . Molds & Smuts Other (See Comments), Palpitations, Shortness Of Breath and Swelling  . Cefixime Swelling  . Celebrex [Celecoxib] Swelling  . Cobalt Other (See Comments)    Per allergist  . Gabapentin Other (See Comments)    Joint pain  . Levofloxacin Other (See Comments)    Muscle and joint pain  . Molybdenum Other (See Comments)    Per allergist  . Other Other (See Comments)    TEQUIN. TEQUIN - JOINT AND MUSCLE PAIN Other Reaction: painful joints TEQUIN. TEQUIN - JOINT AND MUSCLE PAIN Tantal Metal  . Penicillins Hives  . Robaxin [Methocarbamol]   . Tomato Hives  . Adhesive [Tape] Rash    Sensitive to EKG leads; sensitive to 3M Micropore tape  . Dust Mite Extract Itching, Other (See Comments) and Palpitations  . Tetracycline Hcl Rash     Can take Doxy  . Tetracyclines & Related Rash   Family History  Problem Relation Age of Onset  . Rheumatologic disease Mother   . Diabetes Mother   . Heart attack Father   . Rheumatologic disease Sister   . Arthritis Sister        --Rheumatoid arthritis  . Ovarian cancer Sister 45  . Crohn's disease Brother   . Kidney failure Brother   . Ulcerative colitis Brother   . Glaucoma Brother         Current Outpatient Medications (Hematological):  .  Cyanocobalamin (B-12) 1000 MCG/ML KIT, Inject 1 Dose as directed every 14 (fourteen) days. (Patient taking differently: Inject 1 Dose as directed every 21 ( twenty-one) days. )  Current Outpatient Medications (Other):  .  Cholecalciferol (VITAMIN D3) 5000 units CAPS, Take 5,000 Units by mouth every other day.  .  Multiple Vitamins-Minerals (PHYTOMULTI PO), Take by mouth. By mouth every third day .  NON FORMULARY, HydroEye .  Probiotic Product (PROBIOTIC ADVANCED PO), Take by mouth. Patient states taking 5 billion, one capsule daily. .  Pyridoxine HCl (B-6) 100 MG TABS,  .  vitamin C (ASCORBIC ACID) 500 MG tablet, Take 500 mg by mouth daily.     Past medical history, social, surgical and family history all reviewed in electronic medical record.  No pertanent information unless stated regarding to the chief complaint.   Review of Systems:  No headache, visual changes, nausea, vomiting, diarrhea, constipation, dizziness, abdominal pain, skin rash, fevers, chills, night sweats, weight loss, swollen lymph nodes, chest pain, shortness of breath, mood changes.  Positive muscle aches and body aches  Objective  Blood pressure (!) 138/100, pulse (!) 104, height 5' (1.524 m), weight 246 lb (111.6 kg), last menstrual period 01/16/2002, SpO2 96 %.    General: No apparent distress alert and oriented x3 mood and affect normal, dressed appropriately.  HEENT: Pupils equal, extraocular movements intact  Respiratory: Patient's speak in full sentences  and does not appear short of breath  Cardiovascular: Plus lower extremity edema, non tender, no erythema  Skin: Warm dry intact with no signs of infection or rash on extremities or on axial skeleton.  Abdomen: Soft nontender  Neuro: Cranial nerves II through XII are intact, neurovascularly intact in all extremities with 2+ DTRs and 2+ pulses.  Lymph: No lymphadenopathy of posterior or anterior cervical chain or axillae bilaterally.  Gait severely antalgic MSK:  tender with limited range of motion   and good stability and symmetric strength and tone of shoulders, elbows, wrist, hip, and ankles bilaterally.  Knee: Bilateral valgus deformity noted. Large thigh to calf ratio.  Tender to palpation over medial and PF joint line.  Decreased range of motion lacking last 5 degrees of extension in the last 10 degrees of flexion bilaterally instability with valgus force.  painful patellar compression. Patellar glide with moderate crepitus. Patellar and quadriceps tendons unremarkable. Hamstring and quadriceps strength is normal.  Patient back exam does have some loss of lordosis.  Some mild degenerative scoliosis noted.  Significant tightness with Corky Sox test bilaterally.  Patient does have significant tightness of hamstrings but no radicular symptoms at the time.  4+ out of 5 strength  Osteopathic findings  C4 flexed rotated and side bent left C7 flexed rotated and side bent left T3 extended rotated and side bent right inhaled third rib T7 extended rotated and side bent left L4 flexed rotated and side bent left Sacrum right on right    Impression and Recommendations:     This case required medical decision making of moderate complexity. The above documentation has been reviewed and is accurate and complete [name], DO       Note: This dictation was prepared with Dragon dictation along with smaller phrase technology. Any transcriptional errors that result from this process are  unintentional.

## 2018-02-19 ENCOUNTER — Ambulatory Visit (INDEPENDENT_AMBULATORY_CARE_PROVIDER_SITE_OTHER): Payer: Medicare Other | Admitting: Family Medicine

## 2018-02-19 ENCOUNTER — Encounter: Payer: Self-pay | Admitting: Family Medicine

## 2018-02-19 VITALS — BP 138/100 | HR 104 | Ht 60.0 in | Wt 246.0 lb

## 2018-02-19 DIAGNOSIS — M170 Bilateral primary osteoarthritis of knee: Secondary | ICD-10-CM | POA: Diagnosis not present

## 2018-02-19 DIAGNOSIS — M999 Biomechanical lesion, unspecified: Secondary | ICD-10-CM

## 2018-02-19 DIAGNOSIS — M545 Low back pain, unspecified: Secondary | ICD-10-CM

## 2018-02-19 DIAGNOSIS — G8929 Other chronic pain: Secondary | ICD-10-CM | POA: Diagnosis not present

## 2018-02-19 NOTE — Assessment & Plan Note (Signed)
Doing well after the injection.  No significant change in management otherwise at this time.  Patient will likely need a knee replacement but is concerned secondary to patient having trouble with metal.

## 2018-02-19 NOTE — Patient Instructions (Signed)
Good to see you  Lets see what GI has to say  Lets watch the clavicle pain  Sorry if you have some spasms Send me a message in 1-2 weeks after you see GI so we know the plan  See me again in 4 weeks but will need 30 minute appointment

## 2018-02-19 NOTE — Assessment & Plan Note (Signed)
Decision today to treat with OMT was based on Physical Exam  After verbal consent patient was treated with HVLA, ME, FPR techniques in cervical, thoracic, rib,  lumbar and sacral areas  Patient tolerated the procedure well with improvement in symptoms  Patient given exercises, stretches and lifestyle modifications  See medications in patient instructions if given  Patient will follow up in 4-8 weeks 

## 2018-02-19 NOTE — Assessment & Plan Note (Signed)
Degenerative disc disease.  Discussed with patient about core strength.  Tightness more on the right side.  Could be sacroiliac.  Patient is having a work-up for gastroenterology in the near future.  Continues on pain patient has had a history of a herniated umbilical previously that needed mesh repair.  Discussed icing regimen and home exercises.  Follow-up again in 4 to 8 weeks

## 2018-02-20 DIAGNOSIS — M545 Low back pain: Secondary | ICD-10-CM | POA: Diagnosis not present

## 2018-02-20 DIAGNOSIS — M6281 Muscle weakness (generalized): Secondary | ICD-10-CM | POA: Diagnosis not present

## 2018-02-20 DIAGNOSIS — M5136 Other intervertebral disc degeneration, lumbar region: Secondary | ICD-10-CM | POA: Diagnosis not present

## 2018-02-21 ENCOUNTER — Ambulatory Visit: Payer: Medicare Other | Admitting: Family Medicine

## 2018-02-25 DIAGNOSIS — K591 Functional diarrhea: Secondary | ICD-10-CM | POA: Diagnosis not present

## 2018-02-25 DIAGNOSIS — K644 Residual hemorrhoidal skin tags: Secondary | ICD-10-CM | POA: Diagnosis not present

## 2018-02-25 DIAGNOSIS — R1013 Epigastric pain: Secondary | ICD-10-CM | POA: Diagnosis not present

## 2018-02-25 DIAGNOSIS — K5730 Diverticulosis of large intestine without perforation or abscess without bleeding: Secondary | ICD-10-CM | POA: Diagnosis not present

## 2018-02-26 DIAGNOSIS — M5136 Other intervertebral disc degeneration, lumbar region: Secondary | ICD-10-CM | POA: Diagnosis not present

## 2018-02-26 DIAGNOSIS — M6281 Muscle weakness (generalized): Secondary | ICD-10-CM | POA: Diagnosis not present

## 2018-02-26 DIAGNOSIS — M545 Low back pain: Secondary | ICD-10-CM | POA: Diagnosis not present

## 2018-03-06 DIAGNOSIS — M545 Low back pain: Secondary | ICD-10-CM | POA: Diagnosis not present

## 2018-03-06 DIAGNOSIS — M5136 Other intervertebral disc degeneration, lumbar region: Secondary | ICD-10-CM | POA: Diagnosis not present

## 2018-03-06 DIAGNOSIS — M6281 Muscle weakness (generalized): Secondary | ICD-10-CM | POA: Diagnosis not present

## 2018-03-08 ENCOUNTER — Encounter: Payer: Self-pay | Admitting: Family Medicine

## 2018-03-08 DIAGNOSIS — M4807 Spinal stenosis, lumbosacral region: Secondary | ICD-10-CM

## 2018-03-12 DIAGNOSIS — K644 Residual hemorrhoidal skin tags: Secondary | ICD-10-CM | POA: Diagnosis not present

## 2018-03-12 DIAGNOSIS — K5730 Diverticulosis of large intestine without perforation or abscess without bleeding: Secondary | ICD-10-CM | POA: Diagnosis not present

## 2018-03-12 DIAGNOSIS — I10 Essential (primary) hypertension: Secondary | ICD-10-CM | POA: Diagnosis not present

## 2018-03-12 DIAGNOSIS — K648 Other hemorrhoids: Secondary | ICD-10-CM | POA: Diagnosis not present

## 2018-03-12 DIAGNOSIS — E538 Deficiency of other specified B group vitamins: Secondary | ICD-10-CM | POA: Diagnosis not present

## 2018-03-12 DIAGNOSIS — E669 Obesity, unspecified: Secondary | ICD-10-CM | POA: Diagnosis not present

## 2018-03-12 DIAGNOSIS — K2950 Unspecified chronic gastritis without bleeding: Secondary | ICD-10-CM | POA: Diagnosis not present

## 2018-03-12 DIAGNOSIS — M1990 Unspecified osteoarthritis, unspecified site: Secondary | ICD-10-CM | POA: Diagnosis not present

## 2018-03-12 DIAGNOSIS — D124 Benign neoplasm of descending colon: Secondary | ICD-10-CM | POA: Diagnosis not present

## 2018-03-12 DIAGNOSIS — D125 Benign neoplasm of sigmoid colon: Secondary | ICD-10-CM | POA: Diagnosis not present

## 2018-03-12 DIAGNOSIS — Z6841 Body Mass Index (BMI) 40.0 and over, adult: Secondary | ICD-10-CM | POA: Diagnosis not present

## 2018-03-12 DIAGNOSIS — D131 Benign neoplasm of stomach: Secondary | ICD-10-CM | POA: Diagnosis not present

## 2018-03-12 DIAGNOSIS — D122 Benign neoplasm of ascending colon: Secondary | ICD-10-CM | POA: Diagnosis not present

## 2018-03-12 DIAGNOSIS — R1031 Right lower quadrant pain: Secondary | ICD-10-CM | POA: Diagnosis not present

## 2018-03-12 DIAGNOSIS — Z8371 Family history of colonic polyps: Secondary | ICD-10-CM | POA: Diagnosis not present

## 2018-03-12 DIAGNOSIS — K317 Polyp of stomach and duodenum: Secondary | ICD-10-CM | POA: Diagnosis not present

## 2018-03-12 DIAGNOSIS — Z8379 Family history of other diseases of the digestive system: Secondary | ICD-10-CM | POA: Diagnosis not present

## 2018-03-12 DIAGNOSIS — K222 Esophageal obstruction: Secondary | ICD-10-CM | POA: Diagnosis not present

## 2018-03-12 DIAGNOSIS — R1310 Dysphagia, unspecified: Secondary | ICD-10-CM | POA: Diagnosis not present

## 2018-03-12 DIAGNOSIS — Z8719 Personal history of other diseases of the digestive system: Secondary | ICD-10-CM | POA: Diagnosis not present

## 2018-03-12 DIAGNOSIS — D126 Benign neoplasm of colon, unspecified: Secondary | ICD-10-CM | POA: Diagnosis not present

## 2018-03-12 DIAGNOSIS — K635 Polyp of colon: Secondary | ICD-10-CM | POA: Diagnosis not present

## 2018-03-12 DIAGNOSIS — E7800 Pure hypercholesterolemia, unspecified: Secondary | ICD-10-CM | POA: Diagnosis not present

## 2018-03-12 DIAGNOSIS — K449 Diaphragmatic hernia without obstruction or gangrene: Secondary | ICD-10-CM | POA: Diagnosis not present

## 2018-03-12 DIAGNOSIS — G4730 Sleep apnea, unspecified: Secondary | ICD-10-CM | POA: Diagnosis not present

## 2018-03-12 DIAGNOSIS — Z79899 Other long term (current) drug therapy: Secondary | ICD-10-CM | POA: Diagnosis not present

## 2018-03-12 DIAGNOSIS — K219 Gastro-esophageal reflux disease without esophagitis: Secondary | ICD-10-CM | POA: Diagnosis not present

## 2018-03-12 DIAGNOSIS — R195 Other fecal abnormalities: Secondary | ICD-10-CM | POA: Diagnosis not present

## 2018-03-12 HISTORY — PX: COLONOSCOPY WITH ESOPHAGOGASTRODUODENOSCOPY (EGD) AND ESOPHAGEAL DILATION (ED): SHX6495

## 2018-03-18 ENCOUNTER — Other Ambulatory Visit: Payer: Medicare Other

## 2018-03-19 ENCOUNTER — Other Ambulatory Visit: Payer: Self-pay | Admitting: Family Medicine

## 2018-03-19 ENCOUNTER — Ambulatory Visit (INDEPENDENT_AMBULATORY_CARE_PROVIDER_SITE_OTHER): Payer: Medicare Other | Admitting: Family Medicine

## 2018-03-19 ENCOUNTER — Ambulatory Visit
Admission: RE | Admit: 2018-03-19 | Discharge: 2018-03-19 | Disposition: A | Discharge status: Home / Self Care | Payer: Medicare Other | Source: Ambulatory Visit | Attending: Family Medicine | Admitting: Family Medicine

## 2018-03-19 VITALS — BP 140/90 | HR 87 | Ht 60.0 in | Wt 239.0 lb

## 2018-03-19 DIAGNOSIS — M5416 Radiculopathy, lumbar region: Secondary | ICD-10-CM

## 2018-03-19 DIAGNOSIS — M5116 Intervertebral disc disorders with radiculopathy, lumbar region: Secondary | ICD-10-CM | POA: Diagnosis not present

## 2018-03-19 DIAGNOSIS — M999 Biomechanical lesion, unspecified: Secondary | ICD-10-CM | POA: Diagnosis not present

## 2018-03-19 DIAGNOSIS — M48061 Spinal stenosis, lumbar region without neurogenic claudication: Secondary | ICD-10-CM | POA: Diagnosis not present

## 2018-03-19 DIAGNOSIS — M4807 Spinal stenosis, lumbosacral region: Secondary | ICD-10-CM

## 2018-03-19 MED ORDER — "SYRINGE/NEEDLE (DISP) 25G X 5/8"" 3 ML MISC": 0 refills | Status: DC

## 2018-03-19 NOTE — Progress Notes (Signed)
[name] [address] [place], [place] [address] Phone: [phone] Subjective:   I Kerry [name] am serving as a Education administrator for Dr. Hulan Saas.\   CC: Back pain follow-up  [id number]  KABAO Kerry [name] is a 68 y.o. female coming in with complaint of back pain. States that she is feeling better than she's felt in a while. Has a few outpatient procedures last week. Experienced some sharp pain in the back after sleeping in a different bed. Has some questions about B6. Needs a prescription for syringes. Bilateral shoulder pain. Has been doing exercises. MRI later today but having issues with approval. Was told that Ria Comment was working on it.  Patient continues to have low back pain.  Intermittent radiation down the legs.  Patient states it is stopping her from certain activity.  Wanting to know what is going on and see what could be done about it.     Past Medical History:  Diagnosis Date  . Abnormal uterine bleeding   . Allergic rhinitis, cause unspecified   . Arthritis of knee, right   . Bursitis of left shoulder   . 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 Chronic pain   . Dyspareunia   . Elevated cholesterol   . Fibroid   . Foot fracture, left 01/2015   and torn tendons  . GERD (gastroesophageal reflux disease)   . HTN (hypertension)   . Kidney stones   . Sleep apnea   . Urinary incontinence    Past Surgical History:  Procedure Laterality Date  .  dilatation and curettage  2000  . CARPAL TUNNEL RELEASE  1998  . CATARACT EXTRACTION Bilateral   . dental implants  2010  . ESOPHAGEAL DILATION  2010  . HAMMER TOE SURGERY     rt  . PELVIC LAPAROSCOPY  1990  . ROTATOR CUFF REPAIR Right 09/2011   Dr. Tamera Punt  . ROTATOR CUFF REPAIR Left 11/11/13   Dr. Tamera Punt   . SEPTOPLASTY    . UMBILICAL HERNIA REPAIR  2010   Social History   Socioeconomic History  . Marital status: Married    Spouse name: Not on file  . Number of children: Not on file    . Years of education: Not on file  . Highest education level: Not on file  Occupational History  . Occupation: Retired  Scientific laboratory technician  . Financial resource strain: Not on file  . Food insecurity:    Worry: Not on file    Inability: Not on file  . Transportation needs:    Medical: Not on file    Non-medical: Not on file  Tobacco Use  . Smoking status: Never Smoker  . Smokeless tobacco: Never Used  Substance and Sexual Activity  . Alcohol use: No    Alcohol/week: 0.0 standard drinks  . Drug use: No  . Sexual activity: Never    Partners: Male    Birth control/protection: Post-menopausal  Lifestyle  . Physical activity:    Days per week: Not on file    Minutes per session: Not on file  . Stress: Not on file  Relationships  . Social connections:    Talks on phone: Not on file    Gets together: Not on file    Attends religious service: Not on file    Active member of club or organization: Not on file    Attends meetings of clubs or organizations: Not on file    Relationship status: Not  on file  Other Topics Concern  . Not on file  Social History Narrative  . Not on file   Allergies  Allergen Reactions  . Molds & Smuts Other (See Comments), Palpitations, Shortness Of Breath and Swelling  . Cefixime Swelling  . Celebrex [Celecoxib] Swelling  . Cobalt Other (See Comments)    Per allergist  . Gabapentin Other (See Comments)    Joint pain  . Levofloxacin Other (See Comments)    Muscle and joint pain  . Molybdenum Other (See Comments)    Per allergist  . Other Other (See Comments)    TEQUIN. TEQUIN - JOINT AND MUSCLE PAIN Other Reaction: painful joints TEQUIN. TEQUIN - JOINT AND MUSCLE PAIN Tantal Metal  . Penicillins Hives  . Robaxin [Methocarbamol]   . Tomato Hives  . Adhesive [Tape] Rash    Sensitive to EKG leads; sensitive to 63M Micropore tape  . Dust Mite Extract Itching, Other (See Comments) and Palpitations  . Tetracycline Hcl Rash    Can take Doxy  .  Tetracyclines & Related Rash   Family History  Problem Relation Age of Onset  . Rheumatologic disease Mother   . Diabetes Mother   . Heart attack Father   . Rheumatologic disease Sister   . Arthritis Sister        --Rheumatoid arthritis  . Ovarian cancer Sister 76  . Crohn's disease Brother   . Kidney failure Brother   . Ulcerative colitis Brother   . Glaucoma Brother         Current Outpatient Medications (Hematological):  Marland Kitchen  Cyanocobalamin (B-12) 1000 MCG/ML KIT, Inject 1 Dose as directed every 14 (fourteen) days. (Patient taking differently: Inject 1 Dose as directed every 30 (thirty) days. )  Current Outpatient Medications (Other):  Marland Kitchen  Cholecalciferol (VITAMIN D3) 5000 units CAPS, Take 5,000 Units by mouth every other day.  .  Multiple Vitamins-Minerals (PHYTOMULTI PO), Take by mouth. By mouth every 2nd day .  NON FORMULARY, HydroEye .  Probiotic Product (PROBIOTIC ADVANCED PO), Take by mouth. Patient states taking 5 billion, one capsule daily. .  Pyridoxine HCl (B-6) 100 MG TABS,  .  vitamin C (ASCORBIC ACID) 500 MG tablet, Take 500 mg by mouth daily.  .  SYRINGE-NEEDLE, DISP, 3 ML 25G X 5/8" 3 ML MISC, Inject B12 once monthly.    Past medical history, social, surgical and family history all reviewed in electronic medical record.  No pertanent information unless stated regarding to the chief complaint.   Review of Systems:  No headache, visual changes, nausea, vomiting, diarrhea, constipation, dizziness, abdominal pain, skin rash, fevers, chills, night sweats, weight loss, swollen lymph nodes, , chest pain, shortness of breath, mood changes.  No muscle aches, body aches, joint swelling  Objective  Blood pressure 140/90, pulse 87, height 5' (1.524 m), weight 239 lb (108.4 kg), last menstrual period 01/16/2002, SpO2 97 %.    General: No apparent distress alert and oriented x3 mood and affect normal, dressed appropriately.  HEENT: Pupils equal, extraocular movements  intact  Respiratory: Patient's speak in full sentences and does not appear short of breath  Cardiovascular: No lower extremity edema, non tender, no erythema  Skin: Warm dry intact with no signs of infection or rash on extremities or on axial skeleton.  Abdomen: Soft nontender  Neuro: Cranial nerves II through XII are intact, neurovascularly intact in all extremities with  and 2+ pulses.  Lymph: No lymphadenopathy of posterior or anterior cervical chain or axillae bilaterally.  Gait antalgic MSK: Significant arthritic changes of multiple joints Exam does have loss of lordosis.  Patient does have decreased range of motion in all planes.  Poor core strength noted.  Hip abductor strength of 3+ out of 5.  Negative straight leg test but severe tightness of the hamstrings.  Mild decrease in deep tendon reflexes of the Achilles bilaterally.  Neurovascular intact distally.  Severe tenderness to palpation mostly over the sacroiliac joints but also of the paraspinal musculature of the lumbar spine. Patient's does have some increased discomfort of the lower extremities bilaterally.  Osteopathic findings C6 flexed rotated and side bent left T3 extended rotated and side bent right inhaled third rib T9 extended rotated and side bent left L2 flexed rotated and side bent right Sacrum right on right    Impression and Recommendations:     This case required medical decision making of moderate complexity. The above documentation has been reviewed and is accurate and complete [name], DO       Note: This dictation was prepared with Dragon dictation along with smaller phrase technology. Any transcriptional errors that result from this process are unintentional.

## 2018-03-19 NOTE — Patient Instructions (Signed)
Good to see you  MRI is good to go  Lets watch the shoulder pain but likely some is post viral  I will write you on the MRI probably tomorrow.  Se eme again in 4ish weeks (30 minute appointment

## 2018-03-20 ENCOUNTER — Encounter: Payer: Self-pay | Admitting: Family Medicine

## 2018-03-20 NOTE — Assessment & Plan Note (Signed)
Decision today to treat with OMT was based on Physical Exam  After verbal consent patient was treated with HVLA, ME, FPR techniques in cervical, thoracic, rib lumbar and sacral areas  Patient tolerated the procedure well with improvement in symptoms  Patient given exercises, stretches and lifestyle modifications  See medications in patient instructions if given  Patient will follow up in 4 weeks 

## 2018-03-20 NOTE — Assessment & Plan Note (Signed)
History of a lumbar radiculopathy syndrome but seem to be worse on the left.  Tightness of the hamstrings bilaterally discussed with patient about the MRI and patient is scheduled to have this done today.  Depending on findings could be a candidate for epidural or nerve root injections or even facet injections.  Patient of course would like to avoid any type of surgical intervention.  Patient's health overall has been declining slowly over the course the last several years.  Still feel that there is a possibility for a seronegative rheumatoid arthritis the patient does not want to try any other type of medications.  Patient will follow-up with me again in 4 weeks otherwise.  Will discuss with her previous to this visit about her MRI results.

## 2018-04-16 ENCOUNTER — Encounter: Payer: Self-pay | Admitting: Family Medicine

## 2018-04-16 ENCOUNTER — Ambulatory Visit: Payer: Medicare Other | Admitting: Family Medicine

## 2018-04-17 ENCOUNTER — Ambulatory Visit (INDEPENDENT_AMBULATORY_CARE_PROVIDER_SITE_OTHER): Payer: Medicare Other | Admitting: Family Medicine

## 2018-04-17 ENCOUNTER — Encounter: Payer: Self-pay | Admitting: Family Medicine

## 2018-04-17 DIAGNOSIS — M67911 Unspecified disorder of synovium and tendon, right shoulder: Secondary | ICD-10-CM | POA: Diagnosis not present

## 2018-04-17 DIAGNOSIS — R5383 Other fatigue: Secondary | ICD-10-CM | POA: Diagnosis not present

## 2018-04-17 DIAGNOSIS — M67912 Unspecified disorder of synovium and tendon, left shoulder: Secondary | ICD-10-CM

## 2018-04-17 NOTE — Assessment & Plan Note (Signed)
Patient's history of a couple episodes is concerning for potential paroxysmal atrial fibrillation.  We discussed aspirin as some potential use right now.  Patient is already taking fish oil so we will keep with a baby aspirin.  We discussed if this is associated with shortness of breath or chest pain to call 911 potentially immediately.  We discussed that during the coronavirus outbreak if there is a possibility for [place] to answer any questions we will try.  Patient will do another virtual visit in 4 weeks to further evaluate with [place].  Patient does have a possibility of doing a apple watch for 1 week and see if there is any arrhythmia patterns.  Spent  25 minutes with patient face-to-face and had greater than 50% of counseling including as described above in assessment and plan.

## 2018-04-17 NOTE — Assessment & Plan Note (Signed)
Has had bilateral rotator cuff repair.  Discussed with patient in great length.  Patient is very wary to start any type of medication.  Does not want to do any topical anti-inflammatories either.  Patient did recently go get a TENS unit.  Encouraged her to try it at a low level and 2 times a day of 20 minutes.  Discussed icing regimen and home exercises, we discussed which activities of doing which wants to avoid.  Patient will try this.  We discussed that this could be more cervical radiculopathy and may need gabapentin that did help for the back but patient then had joint pain after 1 month of use.  Patient will have another web visit in 1 month.

## 2018-04-17 NOTE — Patient Instructions (Addendum)
Good to talk to you  Aspirin 81mg  daily  Continue fish oil  Wear the watch for a week and see Atrial fibrillation is a concern  Use the tens unit 2 times a day will check in

## 2018-04-17 NOTE — Progress Notes (Signed)
[name] [address] [place], [place] [address] Phone: [phone] Subjective:    Virtual Visit via Video Note  I connected with [name] on 04/17/18 at  1:00 PM EDT by a video enabled telemedicine application and verified that I am speaking with the correct person using two identifiers.   I discussed the limitations of evaluation and management by telemedicine and the availability of in person appointments. The patient expressed understanding and agreed to proceed.      I discussed the assessment and treatment plan with the patient. The patient was provided an opportunity to ask questions and all were answered. The patient agreed with the plan and demonstrated an understanding of the instructions.   The patient was advised to call back or seek an in-person evaluation if the symptoms worsen or if the condition fails to improve as anticipated.    [name], DO  More detailed note below    CC: Multiple joint pain  [id number]  LADEAN STEINMEYER is a 68 y.o. female coming in with complaint of multiple joint pain.  Bilateral pain overall.  More pain over the shoulders bilaterally as well.  Patient states that symptoms been worsening does not want to take any medicine.  Did start with the to work.  Patient is having side effects from it previously.  Patient is having worsening pain as well.  Has not tried the gabapentin.  Concerned that she will have more muscle aches as well. Patient does not want to take any other medications but is wondering what she can do to help with her pain.  Feels like she is having some bilateral shoulder weakness.  Patient also states over the course the last 3 weeks she has had 3 episodes where she feels somewhat lightheaded and then nauseated and then fatigued for a day or 2.  Patient denies any chest pain but does state that she has had some shortness of breath.  She has been contributing this all to her  allergies.  Patient denies any true fever but she has had a fever of 99 F.  Been checking daily    Past Medical History:  Diagnosis Date  . Abnormal uterine bleeding   . Allergic rhinitis, cause unspecified   . Arthritis of knee, right   . Bursitis of left shoulder   . 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 Chronic pain   . Dyspareunia   . Elevated cholesterol   . Fibroid   . Foot fracture, left 01/2015   and torn tendons  . GERD (gastroesophageal reflux disease)   . HTN (hypertension)   . Kidney stones   . Sleep apnea   . Urinary incontinence    Past Surgical History:  Procedure Laterality Date  .  dilatation and curettage  2000  . CARPAL TUNNEL RELEASE  1998  . CATARACT EXTRACTION Bilateral   . dental implants  2010  . ESOPHAGEAL DILATION  2010  . HAMMER TOE SURGERY     rt  . PELVIC LAPAROSCOPY  1990  . ROTATOR CUFF REPAIR Right 09/2011   Dr. Tamera Punt  . ROTATOR CUFF REPAIR Left 11/11/13   Dr. Tamera Punt   . SEPTOPLASTY    . UMBILICAL HERNIA REPAIR  2010   Social History   Socioeconomic History  . Marital status: Married    Spouse name: Not on file  . Number of children: Not on file  . Years of education: Not on file  .  Highest education level: Not on file  Occupational History  . Occupation: Retired  Scientific laboratory technician  . Financial resource strain: Not on file  . Food insecurity:    Worry: Not on file    Inability: Not on file  . Transportation needs:    Medical: Not on file    Non-medical: Not on file  Tobacco Use  . Smoking status: Never Smoker  . Smokeless tobacco: Never Used  Substance and Sexual Activity  . Alcohol use: No    Alcohol/week: 0.0 standard drinks  . Drug use: No  . Sexual activity: Never    Partners: Male    Birth control/protection: Post-menopausal  Lifestyle  . Physical activity:    Days per week: Not on file    Minutes per session: Not on file  . Stress: Not on file  Relationships  . Social connections:    Talks on phone: Not on  file    Gets together: Not on file    Attends religious service: Not on file    Active member of club or organization: Not on file    Attends meetings of clubs or organizations: Not on file    Relationship status: Not on file  Other Topics Concern  . Not on file  Social History Narrative  . Not on file   Allergies  Allergen Reactions  . Molds & Smuts Other (See Comments), Palpitations, Shortness Of Breath and Swelling  . Cefixime Swelling  . Celebrex [Celecoxib] Swelling  . Cobalt Other (See Comments)    Per allergist  . Gabapentin Other (See Comments)    Joint pain  . Levofloxacin Other (See Comments)    Muscle and joint pain  . Molybdenum Other (See Comments)    Per allergist  . Other Other (See Comments)    TEQUIN. TEQUIN - JOINT AND MUSCLE PAIN Other Reaction: painful joints TEQUIN. TEQUIN - JOINT AND MUSCLE PAIN Tantal Metal  . Penicillins Hives  . Robaxin [Methocarbamol]   . Tomato Hives  . Adhesive [Tape] Rash    Sensitive to EKG leads; sensitive to 47M Micropore tape  . Dust Mite Extract Itching, Other (See Comments) and Palpitations  . Tetracycline Hcl Rash    Can take Doxy  . Tetracyclines & Related Rash   Family History  Problem Relation Age of Onset  . Rheumatologic disease Mother   . Diabetes Mother   . Heart attack Father   . Rheumatologic disease Sister   . Arthritis Sister        --Rheumatoid arthritis  . Ovarian cancer Sister 27  . Crohn's disease Brother   . Kidney failure Brother   . Ulcerative colitis Brother   . Glaucoma Brother         Current Outpatient Medications (Hematological):  Marland Kitchen  Cyanocobalamin (B-12) 1000 MCG/ML KIT, Inject 1 Dose as directed every 14 (fourteen) days. (Patient taking differently: Inject 1 Dose as directed every 30 (thirty) days. )  Current Outpatient Medications (Other):  Marland Kitchen  Cholecalciferol (VITAMIN D3) 5000 units CAPS, Take 5,000 Units by mouth every other day.  .  Multiple Vitamins-Minerals (PHYTOMULTI PO),  Take by mouth. By mouth every 2nd day .  NON FORMULARY, HydroEye .  Probiotic Product (PROBIOTIC ADVANCED PO), Take by mouth. Patient states taking 5 billion, one capsule daily. .  Pyridoxine HCl (B-6) 100 MG TABS,  .  SYRINGE-NEEDLE, DISP, 3 ML 25G X 5/8" 3 ML MISC, Inject B12 once monthly. .  vitamin C (ASCORBIC ACID) 500 MG tablet, Take  500 mg by mouth daily.     Past medical history, social, surgical and family history all reviewed in electronic medical record.  No pertanent information unless stated regarding to the chief complaint.   Review of Systems: As stated above  Objective  This is done as a video visit   General: No apparent distress alert and oriented x3 mood and affect normal, dressed appropriately.     Impression and Recommendations:    y. The above documentation has been reviewed and is accurate and complete [name], DO       Note: This dictation was prepared with Dragon dictation along with smaller phrase technology. Any transcriptional errors that result from this process are unintentional.

## 2018-05-14 NOTE — Progress Notes (Signed)
[name] [address] [place], [place] [address] Phone: [phone] Subjective:    Virtual Visit via Video Note  I connected with Kerry [name] on  at  2:45 PM EDT by a video enabled telemedicine application and verified that I am speaking with the correct person using two identifiers.   I discussed the limitations of evaluation and management by telemedicine and the availability of in person appointments. The patient expressed understanding and agreed to proceed.  Patient was in her home setting and I was in the office setting for virtual complete form   I discussed the assessment and treatment plan with the patient. The patient was provided an opportunity to ask questions and all were answered. The patient agreed with the plan and demonstrated an understanding of the instructions.   The patient was advised to call back or seek an in-person evaluation if the symptoms worsen or if the condition fails to improve as anticipated.  I provided 31 minutes of -face-to-face time during this encounter.  This included going over patient's my chart messages    [name], DO    CC: Pain follow-up  [id number]  Kerry [name] is a 68 y.o. female coming in with complaint of patient was seen previously has had some difficulty recently.  Patient did have some erythema of her cheek and then had discharge from her eye.  Patient was put on doxycycline.  States that it made a significant improvement.  Was only on it for 1 week ago.  Concerned that it may come back again.  This all started with some tooth pain.  Has not had any more surgery recently.     Patient's my chart message I took ECGs with Mike's Apple watch (no AFib detected-did not take baby aspirin).  Shoulder pain varied with activity & antibiotic use-use of TENS unit pending.  Had 2-3 weeks of sore upper right gums that worsened last week.  On Wed & Thurs, right cheek became swollen/painful near  my nose-also had an insect sting-wasp?-on Thurs.  On Fri, swelling of cheek moved nearer to right eye.  On Sat, 4/25, I awoke to severe swelling below right eye and pain/redness beside nose & under right eye-decided to start 7-day prescription of doxycycline for gums that was not used in Dec.  On Sun & Mon right eye had drainage-used hot, moist compresses & Refresh eye drops.  By Mon, 4/27, I also had a sore/swollen right tonsil & extremely sore area around right ear lobe.  Finally started feeling better yesterday.  Today, 4/29, right eye & right jaw/ear lobe tenderness continue to improve.  I have doxycycline thru Fri.   Patient was to do a TENS unit but never did start it secondary to being concerned with the possibility of atrial fibrillation.  Past Medical History:  Diagnosis Date  . Abnormal uterine bleeding   . Allergic rhinitis, cause unspecified   . Arthritis of knee, right   . Bursitis of left shoulder   . 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 Chronic pain   . Dyspareunia   . Elevated cholesterol   . Fibroid   . Foot fracture, left 01/2015   and torn tendons  . GERD (gastroesophageal reflux disease)   . HTN (hypertension)   . Kidney stones   . Sleep apnea   . Urinary incontinence    Past Surgical History:  Procedure Laterality Date  .  dilatation and curettage  2000  .  CARPAL TUNNEL RELEASE  1998  . CATARACT EXTRACTION Bilateral   . dental implants  2010  . ESOPHAGEAL DILATION  2010  . HAMMER TOE SURGERY     rt  . PELVIC LAPAROSCOPY  1990  . ROTATOR CUFF REPAIR Right 09/2011   Dr. Tamera Punt  . ROTATOR CUFF REPAIR Left 11/11/13   Dr. Tamera Punt   . SEPTOPLASTY    . UMBILICAL HERNIA REPAIR  2010   Social History   Socioeconomic History  . Marital status: Married    Spouse name: Not on file  . Number of children: Not on file  . Years of education: Not on file  . Highest education level: Not on file  Occupational History  . Occupation: Retired  Scientific laboratory technician   . Financial resource strain: Not on file  . Food insecurity:    Worry: Not on file    Inability: Not on file  . Transportation needs:    Medical: Not on file    Non-medical: Not on file  Tobacco Use  . Smoking status: Never Smoker  . Smokeless tobacco: Never Used  Substance and Sexual Activity  . Alcohol use: No    Alcohol/week: 0.0 standard drinks  . Drug use: No  . Sexual activity: Never    Partners: Male    Birth control/protection: Post-menopausal  Lifestyle  . Physical activity:    Days per week: Not on file    Minutes per session: Not on file  . Stress: Not on file  Relationships  . Social connections:    Talks on phone: Not on file    Gets together: Not on file    Attends religious service: Not on file    Active member of club or organization: Not on file    Attends meetings of clubs or organizations: Not on file    Relationship status: Not on file  Other Topics Concern  . Not on file  Social History Narrative  . Not on file   Allergies  Allergen Reactions  . Molds & Smuts Other (See Comments), Palpitations, Shortness Of Breath and Swelling  . Cefixime Swelling  . Celebrex [Celecoxib] Swelling  . Cobalt Other (See Comments)    Per allergist  . Gabapentin Other (See Comments)    Joint pain  . Levofloxacin Other (See Comments)    Muscle and joint pain  . Molybdenum Other (See Comments)    Per allergist  . Other Other (See Comments)    TEQUIN. TEQUIN - JOINT AND MUSCLE PAIN Other Reaction: painful joints TEQUIN. TEQUIN - JOINT AND MUSCLE PAIN Tantal Metal  . Penicillins Hives  . Robaxin [Methocarbamol]   . Tomato Hives  . Adhesive [Tape] Rash    Sensitive to EKG leads; sensitive to 69M Micropore tape  . Dust Mite Extract Itching, Other (See Comments) and Palpitations  . Tetracycline Hcl Rash    Can take Doxy  . Tetracyclines & Related Rash   Family History  Problem Relation Age of Onset  . Rheumatologic disease Mother   . Diabetes Mother   .  Heart attack Father   . Rheumatologic disease Sister   . Arthritis Sister        --Rheumatoid arthritis  . Ovarian cancer Sister 67  . Crohn's disease Brother   . Kidney failure Brother   . Ulcerative colitis Brother   . Glaucoma Brother         Current Outpatient Medications (Hematological):  Marland Kitchen  Cyanocobalamin (B-12) 1000 MCG/ML KIT, Inject 1 Dose as  directed every 14 (fourteen) days. (Patient taking differently: Inject 1 Dose as directed every 30 (thirty) days. )  Current Outpatient Medications (Other):  Marland Kitchen  Cholecalciferol (VITAMIN D3) 5000 units CAPS, Take 5,000 Units by mouth every other day.  Marland Kitchen  doxycycline (VIBRA-TABS) 100 MG tablet, Take 1 tablet (100 mg total) by mouth 2 (two) times daily for 7 days. .  Multiple Vitamins-Minerals (PHYTOMULTI PO), Take by mouth. By mouth every 2nd day .  NON FORMULARY, HydroEye .  Probiotic Product (PROBIOTIC ADVANCED PO), Take by mouth. Patient states taking 5 billion, one capsule daily. .  Pyridoxine HCl (B-6) 100 MG TABS,  .  SYRINGE-NEEDLE, DISP, 3 ML 25G X 5/8" 3 ML MISC, Inject B12 once monthly. .  vitamin C (ASCORBIC ACID) 500 MG tablet, Take 500 mg by mouth daily.     Past medical history, social, surgical and family history all reviewed in electronic medical record.  No pertanent information unless stated regarding to the chief complaint.   Review of Systems:  No headache, visual changes, nausea, vomiting, diarrhea, constipation, dizziness, abdominal pain, skin rash, fevers, chills, night sweats, weight loss, swollen lymph nodes, body aches, joint swelling,  chest pain, shortness of breath, mood changes.  Positive muscle aches  Objective     General: No apparent distress alert and oriented x3 mood and affect normal, dressed appropriately.  HEENT: Pupils equal, extraocular movements intact  Respiratory: Patient's speak in full sentences and does not appear short of breath      Impression and Recommendations:     This case  required medical decision making of moderate complexity. The above documentation has been reviewed and is accurate and complete [name], DO       Note: This dictation was prepared with Dragon dictation along with smaller phrase technology. Any transcriptional errors that result from this process are unintentional.

## 2018-05-15 ENCOUNTER — Encounter: Payer: Self-pay | Admitting: Family Medicine

## 2018-05-15 ENCOUNTER — Ambulatory Visit (INDEPENDENT_AMBULATORY_CARE_PROVIDER_SITE_OTHER): Payer: Medicare Other | Admitting: Family Medicine

## 2018-05-15 DIAGNOSIS — G894 Chronic pain syndrome: Secondary | ICD-10-CM

## 2018-05-15 MED ORDER — DOXYCYCLINE HYCLATE 100 MG PO TABS: 100.0000 mg | ORAL_TABLET | Freq: Two times a day (BID) | ORAL | 0 refills | Status: AC

## 2018-05-16 ENCOUNTER — Encounter: Payer: Self-pay | Admitting: Family Medicine

## 2018-05-16 NOTE — Assessment & Plan Note (Signed)
Chronic pain as well as has been very difficult.  Discussed her negative rheumatoid arthritis again.  Patient will still avoid all other medications if possible.  Discussed which activities of doing which wants to avoid.  Patient has been doing fairly well with the virtual exam as well. Follow-up in 3 to 4 weeks.

## 2018-05-17 ENCOUNTER — Encounter: Payer: Self-pay | Admitting: Family Medicine

## 2018-06-12 NOTE — Progress Notes (Signed)
Virtual Visit via Video Note  I connected with Kerry [name] on 06/14/18 at  3:00 PM EDT by a video enabled telemedicine application and verified that I am speaking with the correct person using two identifiers.  Location: Patient: Patient was in home setting Provider: I was in office setting   I discussed the limitations of evaluation and management by telemedicine and the availability of in person appointments. The patient expressed understanding and agreed to proceed.  History of Present Illness: Patient is following up for polyarthralgia and chronic pain syndrome.  Has been responding somewhat to manipulation.  Continues to have discomfort and pain.  Felt it was more secondary to her mouth and possibly of a smoldering infection.  Patient has had most everything removed at this moment.  Patient still has some increasing discomfort of the mouth.  States sometimes she feels like she has has an infection again.  Patient is somewhat concerned because her older son is coming back from [place] and does not know how to isolate secondary to the coronavirus.    Observations/Objective: Patient seems to be doing relatively well.  Seems happy and alert   Assessment and Plan: Chronic pain.  Possible seronegative arthritis.  Patient does not want to do any of the disease modifying agents.  Patient continues with the other treatments including the home exercises, icing regimen, we discussed patient's email and grades length.  Patient is to increase activity slowly over the course the next several times.  Patient will be following up with me hopefully in office in 4 to 6 weeks to restart manipulation if we feel that it is safe enough for her.  Patient is in agreement with the plan.      I discussed the assessment and treatment plan with the patient. The patient was provided an opportunity to ask questions and all were answered. The patient agreed with the plan and demonstrated an understanding of the  instructions.   The patient was advised to call back or seek an in-person evaluation if the symptoms worsen or if the condition fails to improve as anticipated.  I provided spent  25 minutes with patient face-to-face and had greater than 50% of counseling including as described above in assessment and plan.     [name], DO

## 2018-06-13 ENCOUNTER — Encounter: Payer: Self-pay | Admitting: Family Medicine

## 2018-06-13 ENCOUNTER — Ambulatory Visit (INDEPENDENT_AMBULATORY_CARE_PROVIDER_SITE_OTHER): Payer: Medicare Other | Admitting: Family Medicine

## 2018-06-13 DIAGNOSIS — G8929 Other chronic pain: Secondary | ICD-10-CM

## 2018-06-13 DIAGNOSIS — M2550 Pain in unspecified joint: Secondary | ICD-10-CM

## 2018-06-14 ENCOUNTER — Encounter: Payer: Self-pay | Admitting: Family Medicine

## 2018-07-12 ENCOUNTER — Encounter: Payer: Self-pay | Admitting: Family Medicine

## 2018-07-18 ENCOUNTER — Encounter: Payer: Self-pay | Admitting: Family Medicine

## 2018-07-21 NOTE — Progress Notes (Signed)
[name] [place] [address], [place] [address] Phone: [phone] Subjective:   I Kerry [name] am serving as a Education administrator for Dr. Hulan Saas.  I'm seeing this patient by the request  of:  Street, [name], MD   CC: all over pain follow up   [id number]  Kerry [name] is a 68 y.o. female coming in with complaint of polyarthralgia. States she is achy but better than she was a few weeks ago. Needs prescription for syringes.  Patient has had more of a knee pain recently.  Fell on the anterior aspect of the knee.  Known arthritic changes.  Patient states since been giving her more trouble.  Had difficulty walking initially now is able to walk with a cane. Patient is also concerned.  Having family coming into town.  Patient wants to know if she does need to be more worried about the [place].      Past Medical History:  Diagnosis Date  . Abnormal uterine bleeding   . Allergic rhinitis, cause unspecified   . Arthritis of knee, right   . Bursitis of left shoulder   . 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 Chronic pain   . Dyspareunia   . Elevated cholesterol   . Fibroid   . Foot fracture, left 01/2015   and torn tendons  . GERD (gastroesophageal reflux disease)   . HTN (hypertension)   . Kidney stones   . Sleep apnea   . Urinary incontinence    Past Surgical History:  Procedure Laterality Date  .  dilatation and curettage  2000  . CARPAL TUNNEL RELEASE  1998  . CATARACT EXTRACTION Bilateral   . dental implants  2010  . ESOPHAGEAL DILATION  2010  . HAMMER TOE SURGERY     rt  . PELVIC LAPAROSCOPY  1990  . ROTATOR CUFF REPAIR Right 09/2011   Dr. Tamera Punt  . ROTATOR CUFF REPAIR Left 11/11/13   Dr. Tamera Punt   . SEPTOPLASTY    . UMBILICAL HERNIA REPAIR  2010   Social History   Socioeconomic History  . Marital status: Married    Spouse name: Not on file  . Number of children: Not on file  . Years of education: Not on file  .  Highest education level: Not on file  Occupational History  . Occupation: Retired  Scientific laboratory technician  . Financial resource strain: Not on file  . Food insecurity    Worry: Not on file    Inability: Not on file  . Transportation needs    Medical: Not on file    Non-medical: Not on file  Tobacco Use  . Smoking status: Never Smoker  . Smokeless tobacco: Never Used  Substance and Sexual Activity  . Alcohol use: No    Alcohol/week: 0.0 standard drinks  . Drug use: No  . Sexual activity: Never    Partners: Male    Birth control/protection: Post-menopausal  Lifestyle  . Physical activity    Days per week: Not on file    Minutes per session: Not on file  . Stress: Not on file  Relationships  . Social Herbalist on phone: Not on file    Gets together: Not on file    Attends religious service: Not on file    Active member of club or organization: Not on file    Attends meetings of clubs or organizations: Not on file    Relationship status: Not  on file  Other Topics Concern  . Not on file  Social History Narrative  . Not on file   Allergies  Allergen Reactions  . Molds & Smuts Other (See Comments), Palpitations, Shortness Of Breath and Swelling  . Cefixime Swelling  . Celebrex [Celecoxib] Swelling  . Cobalt Other (See Comments)    Per allergist  . Gabapentin Other (See Comments)    Joint pain  . Levofloxacin Other (See Comments)    Muscle and joint pain  . Molybdenum Other (See Comments)    Per allergist  . Other Other (See Comments)    TEQUIN. TEQUIN - JOINT AND MUSCLE PAIN Other Reaction: painful joints TEQUIN. TEQUIN - JOINT AND MUSCLE PAIN Tantal Metal  . Penicillins Hives  . Robaxin [Methocarbamol]   . Tomato Hives  . Adhesive [Tape] Rash    Sensitive to EKG leads; sensitive to 75M Micropore tape  . Dust Mite Extract Itching, Other (See Comments) and Palpitations  . Tetracycline Hcl Rash    Can take Doxy  . Tetracyclines & Related Rash   Family History   Problem Relation Age of Onset  . Rheumatologic disease Mother   . Diabetes Mother   . Heart attack Father   . Rheumatologic disease Sister   . Arthritis Sister        --Rheumatoid arthritis  . Ovarian cancer Sister 4  . Crohn's disease Brother   . Kidney failure Brother   . Ulcerative colitis Brother   . Glaucoma Brother         Current Outpatient Medications (Hematological):  Marland Kitchen  Cyanocobalamin (B-12) 1000 MCG/ML KIT, Inject 1 Dose as directed every 14 (fourteen) days. (Patient taking differently: Inject 1 Dose as directed. 3-4 weeks)  Current Outpatient Medications (Other):  Marland Kitchen  ADVANCED EVENING PRIMROSE OIL PO, Take by mouth. 1 each per day .  Cholecalciferol (VITAMIN D3) 5000 units CAPS, Take 5,000 Units by mouth. Every MWF .  Multiple Vitamin (MULTIVITAMIN) tablet, Take 1 tablet by mouth daily. Every 3 day PhytoMulti .  Omega-3 Fatty Acids (FISH OIL) 1000 MG CAPS, Take by mouth. 1 per day .  Probiotic Product (PROBIOTIC ADVANCED PO), Take by mouth. Patient states taking 5 billion, one capsule daily. Suprema Dophilus probiotic .  Pyridoxine HCl (B-6) 100 MG TABS, 2 out of 3 days not taken on the day that Phytomulti is taken .  SYRINGE-NEEDLE, DISP, 3 ML 25G X 5/8" 3 ML MISC, Inject B12 once monthly. (Patient taking differently: Inject B12 every 2 weeks) .  vitamin C (ASCORBIC ACID) 500 MG tablet, Take 500 mg by mouth daily. Not taken on the day that Phytomulti is taken    Past medical history, social, surgical and family history all reviewed in electronic medical record.  No pertanent information unless stated regarding to the chief complaint.   Review of Systems:  No headache, visual changes, nausea, vomiting, diarrhea, constipation, dizziness, abdominal pain, skin rash, fevers, chills, night sweats, weight loss, swollen lymph nodes, body aches, joint swelling, muscle aches, chest pain, shortness of breath, mood changes. +muscle aches   Objective  Blood pressure (!)  160/98, pulse 99, height 5' (1.524 m), weight 239 lb (108.4 kg), last menstrual period 01/16/2002, SpO2 97 %.   General: No apparent distress alert and oriented x3 mood and affect normal, dressed appropriately.  HEENT: Pupils equal, extraocular movements intact  Respiratory: Patient's speak in full sentences and does not appear short of breath  Cardiovascular: Trace lower extremity edema, non tender, no erythema  Skin: Warm dry intact with no signs of infection or rash on extremities or on axial skeleton.  Abdomen: Soft nontender  Neuro: Cranial nerves II through XII are intact, neurovascularly intact in all extremities with 2+ DTRs and 2+ pulses.  Lymph: No lymphadenopathy of posterior or anterior cervical chain or axillae bilaterally.   MSK:  tender with limitedrange of motion and stability and symmetric strength and tone of shoulders, elbows, wrist, hip, knee and ankles bilaterally.  Arthritic changes of multiple joints Antalgic gait  Neck: Inspection loss of lordosis No palpable stepoffs. Negative Spurling's maneuver. Full neck range of motion Grip strength and sensation normal in bilateral hands Strength good C4 to T1 distribution No sensory change to C4 to T1 Negative Hoffman sign bilaterally Reflexes normal  Back Exam:  Inspection: Significant loss of lordosis with mild degenerative scoliosis Motion: Flexion 45 deg, Extension 25 deg, Side Bending to 45 deg bilaterally,  Rotation to 45 deg bilaterally  SLR laying: Negative  XSLR laying: Negative  Palpable tenderness: Tender to palpation of paraspinal musculature lumbar spine. FABER: Significant tightness bilaterally secondary to patient's body habitus. Sensory change: Gross sensation intact to all lumbar and sacral dermatomes.  Reflexes: 2+ at both patellar tendons, 2+ at achilles tendons, Babinski's downgoing.  Strength at foot  Plantar-flexion: 5/5 Dorsi-flexion: 5/5 Eversion: 5/5 Inversion: 5/5  Leg strength  Quad: 5/5  Hamstring: 5/5 Hip flexor: 5/5 Hip abductors: 5/5  Gait unremarkable.  Osteopathic findings C2 flexed rotated and side bent right C6 flexed rotated and side bent left T3 extended rotated and side bent right inhaled third rib T7 extended rotated and side bent left L2 flexed rotated and side bent right Sacrum right on right  Limited musculoskeletal ultrasound was performed and interpreted by [name]  Limited ultrasound of patient's right knee shows the patient does have severe tricompartmental osteoarthritic changes but no true acute changes noted.   Impression and Recommendations:     This case required medical decision making of moderate complexity. The above documentation has been reviewed and is accurate and complete [name], DO       Note: This dictation was prepared with Dragon dictation along with smaller phrase technology. Any transcriptional errors that result from this process are unintentional.

## 2018-07-22 ENCOUNTER — Encounter: Payer: Self-pay | Admitting: Family Medicine

## 2018-07-22 ENCOUNTER — Other Ambulatory Visit: Payer: Self-pay

## 2018-07-22 ENCOUNTER — Other Ambulatory Visit (INDEPENDENT_AMBULATORY_CARE_PROVIDER_SITE_OTHER): Payer: Medicare Other

## 2018-07-22 ENCOUNTER — Ambulatory Visit: Payer: Self-pay

## 2018-07-22 ENCOUNTER — Ambulatory Visit: Payer: Medicare Other | Admitting: Family Medicine

## 2018-07-22 ENCOUNTER — Ambulatory Visit (INDEPENDENT_AMBULATORY_CARE_PROVIDER_SITE_OTHER): Payer: Medicare Other | Admitting: Family Medicine

## 2018-07-22 VITALS — BP 160/98 | HR 99 | Ht 60.0 in | Wt 239.0 lb

## 2018-07-22 DIAGNOSIS — M545 Low back pain, unspecified: Secondary | ICD-10-CM

## 2018-07-22 DIAGNOSIS — M2550 Pain in unspecified joint: Secondary | ICD-10-CM

## 2018-07-22 DIAGNOSIS — G8929 Other chronic pain: Secondary | ICD-10-CM

## 2018-07-22 DIAGNOSIS — M999 Biomechanical lesion, unspecified: Secondary | ICD-10-CM | POA: Diagnosis not present

## 2018-07-22 DIAGNOSIS — E538 Deficiency of other specified B group vitamins: Secondary | ICD-10-CM

## 2018-07-22 DIAGNOSIS — M25561 Pain in right knee: Secondary | ICD-10-CM

## 2018-07-22 DIAGNOSIS — E559 Vitamin D deficiency, unspecified: Secondary | ICD-10-CM

## 2018-07-22 DIAGNOSIS — M170 Bilateral primary osteoarthritis of knee: Secondary | ICD-10-CM | POA: Diagnosis not present

## 2018-07-22 LAB — URIC ACID: Uric Acid, Serum: 7 mg/dL (ref 2.4–7.0)

## 2018-07-22 LAB — VITAMIN D 25 HYDROXY (VIT D DEFICIENCY, FRACTURES): VITD: 46.96 ng/mL (ref 30.00–100.00)

## 2018-07-22 LAB — VITAMIN B12: Vitamin B-12: 432 pg/mL (ref 211–911)

## 2018-07-22 NOTE — Patient Instructions (Signed)
See you July 27th Right knee is a contusion Arnica lotion 2x a day  Theracane would be a good idea Tens unit placement above the SI joint vertically for the lower back Covid testing lab downstairs

## 2018-07-22 NOTE — Assessment & Plan Note (Signed)
Decision today to treat with OMT was based on Physical Exam  After verbal consent patient was treated with HVLA, ME, FPR techniques in cervical, thoracic, rib, lumbar and sacral areas  Patient tolerated the procedure well with improvement in symptoms  Patient given exercises, stretches and lifestyle modifications  See medications in patient instructions if given  Patient will follow up in 4 weeks 

## 2018-07-22 NOTE — Assessment & Plan Note (Signed)
Patient does have significant arthritic changes.  I do believe that the fall probably cause more an exacerbation.  Has done fairly well for the viscosupplementation.  We discussed the possibility of injections again.  Patient has declined them at the moment.  We will continue to monitor.  Follow-up again in 4 to 8 weeks

## 2018-07-22 NOTE — Assessment & Plan Note (Signed)
Stable.  Seems to be more secondary to poor core strength.  Discussed icing regimen and home exercise.  Discussed which activities to do which wants to avoid.  Patient is to increase activity slowly.  Follow-up again in 4 to 8 weeks

## 2018-07-23 ENCOUNTER — Other Ambulatory Visit: Payer: Self-pay | Admitting: *Deleted

## 2018-07-23 LAB — PTH, INTACT AND CALCIUM
Calcium: 9.7 mg/dL (ref 8.6–10.4)
PTH: 29 pg/mL (ref 14–64)

## 2018-07-23 LAB — SAR COV2 SEROLOGY (COVID19)AB(IGG),IA: SARS CoV2 AB IGG: NEGATIVE

## 2018-07-23 LAB — CALCIUM, IONIZED: Calcium, Ion: 5.2 mg/dL (ref 4.8–5.6)

## 2018-07-23 MED ORDER — DOXYCYCLINE HYCLATE 100 MG PO TABS: 100.0000 mg | ORAL_TABLET | Freq: Two times a day (BID) | ORAL | 0 refills | Status: DC

## 2018-07-23 MED ORDER — "SYRINGE/NEEDLE (DISP) 25G X 1"" 3 ML MISC": 0 refills | Status: DC

## 2018-07-24 ENCOUNTER — Other Ambulatory Visit: Payer: Self-pay | Admitting: Family Medicine

## 2018-08-01 ENCOUNTER — Ambulatory Visit: Payer: Medicare Other | Admitting: Pulmonary Disease

## 2018-08-01 DIAGNOSIS — L720 Epidermal cyst: Secondary | ICD-10-CM | POA: Diagnosis not present

## 2018-08-01 DIAGNOSIS — L308 Other specified dermatitis: Secondary | ICD-10-CM | POA: Diagnosis not present

## 2018-08-01 DIAGNOSIS — D1721 Benign lipomatous neoplasm of skin and subcutaneous tissue of right arm: Secondary | ICD-10-CM | POA: Diagnosis not present

## 2018-08-01 DIAGNOSIS — D1801 Hemangioma of skin and subcutaneous tissue: Secondary | ICD-10-CM | POA: Diagnosis not present

## 2018-08-01 DIAGNOSIS — L821 Other seborrheic keratosis: Secondary | ICD-10-CM | POA: Diagnosis not present

## 2018-08-01 DIAGNOSIS — D225 Melanocytic nevi of trunk: Secondary | ICD-10-CM | POA: Diagnosis not present

## 2018-08-01 DIAGNOSIS — L814 Other melanin hyperpigmentation: Secondary | ICD-10-CM | POA: Diagnosis not present

## 2018-08-05 ENCOUNTER — Other Ambulatory Visit: Payer: Self-pay

## 2018-08-07 ENCOUNTER — Other Ambulatory Visit: Payer: Self-pay

## 2018-08-07 ENCOUNTER — Other Ambulatory Visit (HOSPITAL_COMMUNITY)
Admission: RE | Admit: 2018-08-07 | Discharge: 2018-08-07 | Disposition: A | Discharge status: Home / Self Care | Payer: Medicare Other | Source: Ambulatory Visit | Attending: Obstetrics and Gynecology | Admitting: Obstetrics and Gynecology

## 2018-08-07 ENCOUNTER — Encounter: Payer: Self-pay | Admitting: Obstetrics and Gynecology

## 2018-08-07 ENCOUNTER — Ambulatory Visit (INDEPENDENT_AMBULATORY_CARE_PROVIDER_SITE_OTHER): Payer: Medicare Other | Admitting: Obstetrics and Gynecology

## 2018-08-07 VITALS — BP 168/84 | HR 108 | Temp 98.1°F | Resp 16 | Ht 60.0 in | Wt 235.0 lb

## 2018-08-07 DIAGNOSIS — Z124 Encounter for screening for malignant neoplasm of cervix: Secondary | ICD-10-CM

## 2018-08-07 DIAGNOSIS — Z01419 Encounter for gynecological examination (general) (routine) without abnormal findings: Secondary | ICD-10-CM | POA: Insufficient documentation

## 2018-08-07 DIAGNOSIS — Z8041 Family history of malignant neoplasm of ovary: Secondary | ICD-10-CM | POA: Diagnosis not present

## 2018-08-07 NOTE — Patient Instructions (Signed)

## 2018-08-07 NOTE — Progress Notes (Signed)
68 y.o. G28P2002 Married Caucasian female here for annual exam.    After her last pelvic ultrasound to check her ovaries, she had severe lower right abdominal and low back pain.  The next day had left thigh muscle spasms. She saw her sports medicine provider who diagnosed nerve irritation after the procedure.  States she skin on her abdomen has become irritated following the procedure as well.  She is having more bladder frequency.  She has urinary urgency with incontinence.  She has had some stress incontinence in past.  She states she can loose complete control at times.  Using pads.   She came down hard on her right knee a couple of months ago. She saw a sports medicine MD about this.  She reinjured her knee today and slid in an unusual position.  She has follow up next week.  PCP:  Christa See, MD   Patient's last menstrual period was 01/16/2002.           Sexually active: No.  The current method of family planning is post menopausal status.    Exercising: Yes.    gardening Smoker:  no  Health Maintenance: Pap:  07/28/16 Negative History of abnormal Pap:  yes MMG:  07/26/17 BIRADS 1 negative/density c Colonoscopy:  03/12/18 Polys removed BMD:   09/10/13  Result  Normal TDaP:  03/30/14 HIV: unsure Hep C: 06/27/17 Negative Screening Labs:  PCP   reports that she has never smoked. She has never used smokeless tobacco. She reports that she does not drink alcohol or use drugs.  Past Medical History:  Diagnosis Date  . Abnormal uterine bleeding   . Allergic rhinitis, cause unspecified   . Arthritis of knee, right   . Bursitis of left shoulder   . 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 Chronic pain   . Dyspareunia   . Elevated cholesterol   . Fibroid   . Foot fracture, left 01/2015   and torn tendons  . GERD (gastroesophageal reflux disease)   . HTN (hypertension)   . Kidney stones   . Sleep apnea   . Urinary incontinence     Past Surgical History:  Procedure  Laterality Date  .  dilatation and curettage  2000  . CARPAL TUNNEL RELEASE  1998  . CATARACT EXTRACTION Bilateral   . dental implants  2010  . DENTAL SURGERY    . ESOPHAGEAL DILATION  2010  . HAMMER TOE SURGERY     rt  . PELVIC LAPAROSCOPY  1990  . ROTATOR CUFF REPAIR Right 09/2011   Dr. Tamera Punt  . ROTATOR CUFF REPAIR Left 11/11/13   Dr. Tamera Punt   . SEPTOPLASTY    . UMBILICAL HERNIA REPAIR  2010    Current Outpatient Medications  Medication Sig Dispense Refill  . ADVANCED EVENING PRIMROSE OIL PO Take by mouth. 1 each per day    . Cholecalciferol (VITAMIN D3) 5000 units CAPS Take 5,000 Units by mouth. Every MWF    . cyanocobalamin (,VITAMIN B-12,) 1000 MCG/ML injection INJECT 1 ML AS DIRECTED EVERY 14 DAYS 6 mL 0  . Misc Natural Products (TART CHERRY ADVANCED PO) 1,200 mg.    . Multiple Vitamin (MULTIVITAMIN) tablet Take 1 tablet by mouth daily. Every 3 day PhytoMulti    . Omega-3 Fatty Acids (FISH OIL) 1000 MG CAPS Take by mouth. 1 per day    . Probiotic Product (PROBIOTIC ADVANCED PO) Take by mouth. Patient states taking 5 billion, one capsule daily. Suprema Dophilus probiotic    .  Pyridoxine HCl (B-6) 100 MG TABS 2 out of 3 days not taken on the day that Phytomulti is taken    . SYRINGE-NEEDLE, DISP, 3 ML 25G X 1" 3 ML MISC Inject B12 every 2 weeks. 50 each 0  . SYRINGE-NEEDLE, DISP, 3 ML 25G X 5/8" 3 ML MISC Inject B12 once monthly. (Patient taking differently: Inject B12 every 2 weeks) 50 each 0  . vitamin C (ASCORBIC ACID) 500 MG tablet Take 500 mg by mouth daily. Not taken on the day that Phytomulti is taken     No current facility-administered medications for this visit.     Family History  Problem Relation Age of Onset  . Rheumatologic disease Mother   . Diabetes Mother   . Heart attack Father   . Rheumatologic disease Sister   . Arthritis Sister        --Rheumatoid arthritis  . Ovarian cancer Sister 93  . Crohn's disease Brother   . Kidney failure Brother   .  Ulcerative colitis Brother   . Glaucoma Brother     Review of Systems  Constitutional: Negative.   HENT: Negative.   Eyes: Negative.   Respiratory: Negative.   Cardiovascular: Negative.   Gastrointestinal: Negative.   Endocrine: Negative.   Genitourinary: Negative.   Musculoskeletal: Negative.   Skin: Negative.   Allergic/Immunologic: Negative.   Neurological: Negative.   Hematological: Negative.   Psychiatric/Behavioral: Negative.     Exam:   BP (!) 168/84 (BP Location: Right Arm, Patient Position: Sitting, Cuff Size: Large)   Pulse (!) 108   Temp 98.1 F (36.7 C) (Temporal)   Resp 16   Ht 5' (1.524 m)   Wt 235 lb (106.6 kg)   LMP 01/16/2002   BMI 45.90 kg/m     General appearance: alert, cooperative and appears stated age Head: normocephalic, without obvious abnormality, atraumatic Neck: no adenopathy, supple, symmetrical, trachea midline and thyroid normal to inspection and palpation Lungs: clear to auscultation bilaterally Breasts: normal appearance, no masses or tenderness, No nipple retraction or dimpling, No nipple discharge or bleeding, No axillary adenopathy Heart: regular rate and rhythm Abdomen: soft, non-tender; no masses, no organomegaly Extremities: extremities normal, atraumatic, no cyanosis or edema Skin: skin color, texture, turgor normal. No rashes or lesions Lymph nodes: cervical, supraclavicular, and axillary nodes normal. Neurologic: grossly normal  Pelvic: External genitalia:  no lesions              No abnormal inguinal nodes palpated.              Urethra:  normal appearing urethra with no masses, tenderness or lesions              Bartholins and Skenes: normal                 Vagina: normal appearing vagina with normal color and discharge, no lesions              Cervix: no lesions.  Anterior cervix seen.               Pap taken: Yes.   Bimanual Exam:  Uterus:  normal size, contour, position, consistency, mobility, non-tender.  Exam limited  by [facility].              Adnexa: no mass, fullness, tenderness              Rectal exam: Yes.  .  Confirms.  Anus:  normal sphincter tone, no lesions  Chaperone was present for exam.  Assessment:   Well woman visit with normal exam. Uterine and cervical fibroid. FH ovarian cancer.  Mixed incontinence.  Remote hx of abnormal pap smear.  BMI 45.90.   Plan: Mammogram screening discussed. Self breast awareness reviewed. Pap and HR HPV as above. Guidelines for Calcium, Vitamin D, regular exercise program including cardiovascular and weight bearing exercise. She will do her next pelvic US in the spring 2021. We discussed that her pelvic exam is limited by body habitus and that the pelvic US is helpful but not part of any protocol for ovarian cancer screening.  She will follow up with urology of choice in [place]. Follow up annually and prn.   After visit summary provided.

## 2018-08-08 ENCOUNTER — Telehealth: Payer: Self-pay | Admitting: Obstetrics and Gynecology

## 2018-08-08 LAB — CYTOLOGY - PAP: Diagnosis: NEGATIVE

## 2018-08-08 NOTE — Telephone Encounter (Signed)
Call placed to scheduled recommended ultrasound. Left voicemail message requesting a return call.

## 2018-08-12 ENCOUNTER — Ambulatory Visit: Payer: Medicare Other | Admitting: Pulmonary Disease

## 2018-08-12 NOTE — Telephone Encounter (Signed)
Second call placed to patient to review benefits and schedule recommended ultrasound. Left voicemail message requesting a return call

## 2018-08-14 ENCOUNTER — Other Ambulatory Visit: Payer: Self-pay

## 2018-08-14 ENCOUNTER — Ambulatory Visit (INDEPENDENT_AMBULATORY_CARE_PROVIDER_SITE_OTHER): Payer: Medicare Other | Admitting: Family Medicine

## 2018-08-14 ENCOUNTER — Ambulatory Visit: Payer: Self-pay

## 2018-08-14 VITALS — BP 146/82 | HR 112 | Ht 60.0 in | Wt 236.0 lb

## 2018-08-14 DIAGNOSIS — M999 Biomechanical lesion, unspecified: Secondary | ICD-10-CM

## 2018-08-14 DIAGNOSIS — M545 Low back pain, unspecified: Secondary | ICD-10-CM

## 2018-08-14 DIAGNOSIS — M25561 Pain in right knee: Secondary | ICD-10-CM | POA: Diagnosis not present

## 2018-08-14 DIAGNOSIS — G8929 Other chronic pain: Secondary | ICD-10-CM

## 2018-08-14 DIAGNOSIS — M170 Bilateral primary osteoarthritis of knee: Secondary | ICD-10-CM | POA: Diagnosis not present

## 2018-08-14 NOTE — Patient Instructions (Addendum)
Good to see you See me again in 6-8 weeks 

## 2018-08-14 NOTE — Progress Notes (Signed)
[name] [address] [place], [place] [address] Phone: [phone] Subjective:   Kerry [name], am serving as a scribe for Dr. Hulan Saas.     CC: Right knee pain, back pain  ACZ:[id number]  Kerry [name] is a 68 y.o. female coming in with complaint of right knee pain. States that her knee pain has increased. Slipped at home and felt 2 pops in her knee. Stepped laterally during injury.  Patient has had arthritis for some time and in the knees bilaterally.  Has been wanting to avoid any type of secondary to potential allergy to metals.  Patient is also having her normal back pain.  He does have known degenerative disc disease.  Has had tightness more recently.  More stress.  Cleaning up the house more because she is having family coming and staying with her for months.     Past Medical History:  Diagnosis Date  . Abnormal uterine bleeding   . Allergic rhinitis, cause unspecified   . Arthritis of knee, right   . Bursitis of left shoulder   . 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 Chronic pain   . Dyspareunia   . Elevated cholesterol   . Fibroid   . Foot fracture, left 01/2015   and torn tendons  . GERD (gastroesophageal reflux disease)   . HTN (hypertension)   . Kidney stones   . Sleep apnea   . Urinary incontinence    Past Surgical History:  Procedure Laterality Date  .  dilatation and curettage  2000  . CARPAL TUNNEL RELEASE  1998  . CATARACT EXTRACTION Bilateral   . dental implants  2010  . DENTAL SURGERY    . ESOPHAGEAL DILATION  2010  . HAMMER TOE SURGERY     rt  . PELVIC LAPAROSCOPY  1990  . ROTATOR CUFF REPAIR Right 09/2011   Dr. Tamera Punt  . ROTATOR CUFF REPAIR Left 11/11/13   Dr. Tamera Punt   . SEPTOPLASTY    . UMBILICAL HERNIA REPAIR  2010   Social History   Socioeconomic History  . Marital status: Married    Spouse name: Not on file  . Number of children: Not on file  . Years of education: Not on file  .  Highest education level: Not on file  Occupational History  . Occupation: Retired  Scientific laboratory technician  . Financial resource strain: Not on file  . Food insecurity    Worry: Not on file    Inability: Not on file  . Transportation needs    Medical: Not on file    Non-medical: Not on file  Tobacco Use  . Smoking status: Never Smoker  . Smokeless tobacco: Never Used  Substance and Sexual Activity  . Alcohol use: [name]    Alcohol/week: 0.0 standard drinks  . Drug use: [name]  . Sexual activity: Not Currently    Partners: Male    Birth control/protection: Post-menopausal  Lifestyle  . Physical activity    Days per week: Not on file    Minutes per session: Not on file  . Stress: Not on file  Relationships  . Social Herbalist on phone: Not on file    Gets together: Not on file    Attends religious service: Not on file    Active member of club or organization: Not on file    Attends meetings of clubs or organizations: Not on file    Relationship status: Not on  file  Other Topics Concern  . Not on file  Social History Narrative  . Not on file   Allergies  Allergen Reactions  . Molds & Smuts Other (See Comments), Palpitations, Shortness Of Breath and Swelling  . Cefixime Swelling  . Celebrex [Celecoxib] Swelling  . Cobalt Other (See Comments)    Per allergist  . Gabapentin Other (See Comments)    Joint pain  . Levofloxacin Other (See Comments)    Muscle and joint pain  . Molybdenum Other (See Comments)    Per allergist  . Other Other (See Comments)    TEQUIN. TEQUIN - JOINT AND MUSCLE PAIN Other Reaction: painful joints TEQUIN. TEQUIN - JOINT AND MUSCLE PAIN Tantal Metal  . Penicillins Hives  . Robaxin [Methocarbamol]   . Tomato Hives  . Adhesive [Tape] Rash    Sensitive to EKG leads; sensitive to 58M Micropore tape  . Dust Mite Extract Itching, Other (See Comments) and Palpitations  . Tetracycline Hcl Rash    Can take Doxy  . Tetracyclines & Related Rash   Family  History  Problem Relation Age of Onset  . Rheumatologic disease Mother   . Diabetes Mother   . Heart attack Father   . Rheumatologic disease Sister   . Arthritis Sister        --Rheumatoid arthritis  . Ovarian cancer Sister 68  . Crohn's disease Brother   . Kidney failure Brother   . Ulcerative colitis Brother   . Glaucoma Brother         Current Outpatient Medications (Hematological):  .  cyanocobalamin (,VITAMIN B-12,) 1000 MCG/ML injection, INJECT 1 ML AS DIRECTED EVERY 14 DAYS  Current Outpatient Medications (Other):  Marland Kitchen  ADVANCED EVENING PRIMROSE OIL PO, Take by mouth. 1 each per day .  Cholecalciferol (VITAMIN D3) 5000 units CAPS, Take 5,000 Units by mouth. Every MWF .  Misc Natural Products (TART CHERRY ADVANCED PO), 1,200 mg. .  Multiple Vitamin (MULTIVITAMIN) tablet, Take 1 tablet by mouth daily. Every 3 day PhytoMulti .  Omega-3 Fatty Acids (FISH OIL) 1000 MG CAPS, Take by mouth. 1 per day .  Probiotic Product (PROBIOTIC ADVANCED PO), Take by mouth. Patient states taking 5 billion, one capsule daily. Suprema Dophilus probiotic .  Pyridoxine HCl (B-6) 100 MG TABS, 2 out of 3 days not taken on the day that Phytomulti is taken .  SYRINGE-NEEDLE, DISP, 3 ML 25G X 1" 3 ML MISC, Inject B12 every 2 weeks. .  SYRINGE-NEEDLE, DISP, 3 ML 25G X 5/8" 3 ML MISC, Inject B12 once monthly. (Patient taking differently: Inject B12 every 2 weeks) .  vitamin C (ASCORBIC ACID) 500 MG tablet, Take 500 mg by mouth daily. Not taken on the day that Phytomulti is taken    Past medical history, social, surgical and family history all reviewed in electronic medical record.  [name] pertanent information unless stated regarding to the chief complaint.   Review of Systems:  [name] headache, visual changes, nausea, vomiting, diarrhea, constipation, dizziness, abdominal pain, skin rash, fevers, chills, night sweats, weight loss, swollen lymph nodes, body aches, chest pain, shortness of breath, mood changes.   Positive muscle aches, joint swelling  Objective  Last menstrual period 01/16/2002.    General: [name] apparent distress alert and oriented x3 mood and affect normal, dressed appropriately.  HEENT: Pupils equal, extraocular movements intact  Respiratory: Patient's speak in full sentences and does not appear short of breath  Cardiovascular: Trace lower extremity edema, non tender, [name] erythema  Skin:  Warm dry intact with [name] signs of infection or rash on extremities or on axial skeleton.  Abdomen: Soft nontender  Neuro: Cranial nerves II through XII are intact, neurovascularly intact in all extremities with 2+ DTRs and 2+ pulses.  Lymph: [name] lymphadenopathy of posterior or anterior cervical chain or axillae bilaterally.  Gait severely antalgic MSK: Arthritic changes of multiple joints and pain out of proportion to the amount of palpation today. Knee: Right valgus deformity noted. Large thigh to calf ratio.  Effusion noted Tender to palpation over medial and PF joint line.  ROM full in flexion and extension and lower leg rotation. instability with valgus force.  painful patellar compression. Patellar glide with moderate crepitus. Patellar and quadriceps tendons unremarkable. Hamstring and quadriceps strength is normal. Contralateral knee shows arthritic changes with mild pain mild instability  Back exam shows loss of lordosis with degenerative scoliosis.  Significant tightness of all musculature which makes range of motion testing difficult.  Significant tightness with hamstring straight leg test.  Unable to do [facility] test also secondary to tightness and pain  Procedure: Real-time Ultrasound Guided Injection of right knee Device: GE Logiq Q7 Ultrasound guided injection is preferred based studies that show increased duration, increased effect, greater accuracy, decreased procedural pain, increased response rate, and decreased cost with ultrasound guided versus blind injection.  Verbal informed  consent obtained.  Time-out conducted.  Noted [name] overlying erythema, induration, or other signs of local infection.  Skin prepped in a sterile fashion.  Local anesthesia: Topical Ethyl chloride.  With sterile technique and under real time ultrasound guidance: With a 22-gauge 2 inch needle patient was injected with 4 cc of 0.5% Marcaine and 1 cc of Kenalog 40 mg/dL. This was from a superior lateral approach.  Completed without difficulty  Pain immediately resolved suggesting accurate placement of the medication.  Advised to call if fevers/chills, erythema, induration, drainage, or persistent bleeding.  Images permanently stored and available for review in the ultrasound unit.  Impression: Technically successful ultrasound guided injection.  Osteopathic findings C6 flexed rotated and side bent left T3 extended rotated and side bent right inhaled third rib T9 extended rotated and side bent left L2 flexed rotated and side bent right Sacrum right on right    Impression and Recommendations:     This case required medical decision making of moderate complexity. The above documentation has been reviewed and is accurate and complete [name], DO       Note: This dictation was prepared with Dragon dictation along with smaller phrase technology. Any transcriptional errors that result from this process are unintentional.

## 2018-08-15 ENCOUNTER — Encounter: Payer: Self-pay | Admitting: Family Medicine

## 2018-08-15 NOTE — Assessment & Plan Note (Signed)
Worsening at this time.  Patient has bone-on-bone.  Discussed the possibility of Visco supplementation again.  Patient will consider.  Patient can have her reaction with the Supartz at a different office which makes her hesitant.  Hopefully this will help.  Follow-up again in 40 weeks

## 2018-08-15 NOTE — Assessment & Plan Note (Signed)
Low back pain.  We discussed in great length.  Discussed posture and ergonomics.  Discussed which activities to do which will still avoid.  Increase activity slowly over the course the next several weeks.  Follow-up again in 4 to 6 weeks

## 2018-08-15 NOTE — Assessment & Plan Note (Signed)
Decision today to treat with OMT was based on Physical Exam  After verbal consent patient was treated with HVLA, ME, FPR techniques in cervical, thoracic, rib lumbar and sacral areas  Patient tolerated the procedure well with improvement in symptoms  Patient given exercises, stretches and lifestyle modifications  See medications in patient instructions if given  Patient will follow up in 4-6 weeks 

## 2018-09-05 NOTE — Telephone Encounter (Signed)
Patient is scheduled for ultrasound, in the Spring of 2021, as instructed by provider. Patient is scheduled 04/10/2019. Will close encounter

## 2018-09-26 ENCOUNTER — Ambulatory Visit: Payer: Medicare Other | Admitting: Family Medicine

## 2018-10-01 NOTE — Progress Notes (Signed)
[name] [address] [place], [place] [address] Phone: [phone] Subjective:    I'm seeing this patient by the request  of:    CC: Polyarthralgia follow-up  [id number]   08/14/2018 Worsening at this time.  Patient has bone-on-bone.  Discussed the possibility of Visco supplementation again.  Patient will consider.  Patient can have her reaction with the Supartz at a different office which makes her hesitant.  Hopefully this will help.  Follow-up again in 40 weeks  Low back pain.  We discussed in great length.  Discussed posture and ergonomics.  Discussed which activities to do which will still avoid.  Increase activity slowly over the course the next several weeks.  Follow-up again in 4 to 6 weeks  Update 10/02/2018 Kerry [name] is a 68 y.o. female coming in with complaint of knee pain. States that her knee continues to bother her. She was having pain from hip to the knee. Has not been able to flex her knee due to pain. Weight of her lower leg causes knee pain as it pulls down on her knee when she is performing hip flexion. Back pain also continues.  Patient is responded very well to osteopathic manipulation.  Patient did present to page concern of multiple complaints Chart which we did go over still trying to figure out the underlying cause of patient's aches and pains arthritis but does not seem to be responding to most of her treatment options.  Patient is still reluctant to take medications and has had difficulty with a lot of intervention     Past Medical History:  Diagnosis Date  . Abnormal uterine bleeding   . Allergic rhinitis, cause unspecified   . Arthritis of knee, right   . Bursitis of left shoulder   . 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 Chronic pain   . Dyspareunia   . Elevated cholesterol   . Fibroid   . Foot fracture, left 01/2015   and torn tendons  . GERD (gastroesophageal reflux disease)   . HTN (hypertension)   . Kidney  stones   . Sleep apnea   . Urinary incontinence    Past Surgical History:  Procedure Laterality Date  .  dilatation and curettage  2000  . CARPAL TUNNEL RELEASE  1998  . CATARACT EXTRACTION Bilateral   . dental implants  2010  . DENTAL SURGERY    . ESOPHAGEAL DILATION  2010  . HAMMER TOE SURGERY     rt  . PELVIC LAPAROSCOPY  1990  . ROTATOR CUFF REPAIR Right 09/2011   Dr. Tamera Punt  . ROTATOR CUFF REPAIR Left 11/11/13   Dr. Tamera Punt   . SEPTOPLASTY    . UMBILICAL HERNIA REPAIR  2010   Social History   Socioeconomic History  . Marital status: Married    Spouse name: Not on file  . Number of children: Not on file  . Years of education: Not on file  . Highest education level: Not on file  Occupational History  . Occupation: Retired  Scientific laboratory technician  . Financial resource strain: Not on file  . Food insecurity    Worry: Not on file    Inability: Not on file  . Transportation needs    Medical: Not on file    Non-medical: Not on file  Tobacco Use  . Smoking status: Never Smoker  . Smokeless tobacco: Never Used  Substance and Sexual Activity  . Alcohol use: No  Alcohol/week: 0.0 standard drinks  . Drug use: No  . Sexual activity: Not Currently    Partners: Male    Birth control/protection: Post-menopausal  Lifestyle  . Physical activity    Days per week: Not on file    Minutes per session: Not on file  . Stress: Not on file  Relationships  . Social Herbalist on phone: Not on file    Gets together: Not on file    Attends religious service: Not on file    Active member of club or organization: Not on file    Attends meetings of clubs or organizations: Not on file    Relationship status: Not on file  Other Topics Concern  . Not on file  Social History Narrative  . Not on file   Allergies  Allergen Reactions  . Molds & Smuts Other (See Comments), Palpitations, Shortness Of Breath and Swelling  . Cefixime Swelling  . Celebrex [Celecoxib] Swelling  .  Cobalt Other (See Comments)    Per allergist  . Gabapentin Other (See Comments)    Joint pain  . Levofloxacin Other (See Comments)    Muscle and joint pain  . Molybdenum Other (See Comments)    Per allergist  . Other Other (See Comments)    TEQUIN. TEQUIN - JOINT AND MUSCLE PAIN Other Reaction: painful joints TEQUIN. TEQUIN - JOINT AND MUSCLE PAIN Tantal Metal  . Penicillins Hives  . Robaxin [Methocarbamol]   . Tomato Hives  . Adhesive [Tape] Rash    Sensitive to EKG leads; sensitive to 3M Micropore tape  . Dust Mite Extract Itching, Other (See Comments) and Palpitations  . Tetracycline Hcl Rash    Can take Doxy  . Tetracyclines & Related Rash   Family History  Problem Relation Age of Onset  . Rheumatologic disease Mother   . Diabetes Mother   . Heart attack Father   . Rheumatologic disease Sister   . Arthritis Sister        --Rheumatoid arthritis  . Ovarian cancer Sister 19  . Crohn's disease Brother   . Kidney failure Brother   . Ulcerative colitis Brother   . Glaucoma Brother         Current Outpatient Medications (Hematological):  .  cyanocobalamin (,VITAMIN B-12,) 1000 MCG/ML injection, INJECT 1 ML AS DIRECTED EVERY 14 DAYS  Current Outpatient Medications (Other):  Marland Kitchen  ADVANCED EVENING PRIMROSE OIL PO, Take by mouth. 1 each per day .  Cholecalciferol (VITAMIN D3) 5000 units CAPS, Take 5,000 Units by mouth. Every MWF .  Misc Natural Products (TART CHERRY ADVANCED PO), 1,200 mg. .  Multiple Vitamin (MULTIVITAMIN) tablet, Take 1 tablet by mouth daily. Every 3 day PhytoMulti .  Omega-3 Fatty Acids (FISH OIL) 1000 MG CAPS, Take by mouth. 1 per day .  Probiotic Product (PROBIOTIC ADVANCED PO), Take by mouth. Patient states taking 5 billion, one capsule daily. Suprema Dophilus probiotic .  Pyridoxine HCl (B-6) 100 MG TABS, 2 out of 3 days not taken on the day that Phytomulti is taken .  SYRINGE-NEEDLE, DISP, 3 ML 25G X 1" 3 ML MISC, Inject B12 every 2 weeks. .   SYRINGE-NEEDLE, DISP, 3 ML 25G X 5/8" 3 ML MISC, Inject B12 once monthly. (Patient taking differently: Inject B12 every 2 weeks) .  vitamin C (ASCORBIC ACID) 500 MG tablet, Take 500 mg by mouth daily. Not taken on the day that Phytomulti is taken    Past medical history, social, surgical and  family history all reviewed in electronic medical record.  No pertanent information unless stated regarding to the chief complaint.   Review of Systems:  No headache, visual changes, nausea, vomiting, diarrhea, constipation, dizziness, abdominal pain, skin rash, fevers, chills, night sweats, weight loss, swollen lymph nodes, body aches, joint swelling, chest pain, shortness of breath, mood changes.  Positive muscle aches  Objective  Blood pressure 128/70, pulse 100, height 5' (1.524 m), weight 234 lb (106.1 kg), last menstrual period 01/16/2002, SpO2 98 %.    General: No apparent distress alert and oriented x3 mood and affect normal, dressed appropriately.  HEENT: Pupils equal, extraocular movements intact  Respiratory: Patient's speak in full sentences and does not appear short of breath  Cardiovascular: No lower extremity edema, non tender, no erythema  Skin: Warm dry intact with no signs of infection or rash on extremities or on axial skeleton.  Abdomen: Soft nontender  Neuro: Cranial nerves II through XII are intact, neurovascularly intact in all extremities with 2+ DTRs and 2+ pulses.  Lymph: No lymphadenopathy of posterior or anterior cervical chain or axillae bilaterally.  Gait severely antalgic walking with the aid of a cane.  MSK: Significant arthritic changes of multiple joints.  Patient has pain out of proportion to the amount of palpation in many different areas.  Right knee exam still has some trace swelling.  Unable to extend the knee completely secondary to grinding of the patella and pain.  Positive patellar grind test.  Lateral tracking of the kneecap noted.  Lacks the last 10 degrees of  flexion as well.  Back exam significant loss of lordosis and poor core strength.  Tender to palpation throughout both paraspinal musculature mostly at the thoracolumbar and lumbar sacral juncture.  Tightness of the hamstrings bilaterally.  Unable to do Corky Sox secondary to body habitus and pain  Osteopathic findings C6 flexed rotated and side bent left T3 extended rotated and side bent right inhaled third rib T9 extended rotated and side bent left L2 flexed rotated and side bent right Sacrum right on right      Impression and Recommendations:     This case required medical decision making of moderate complexity. The above documentation has been reviewed and is accurate and complete [name], DO       Note: This dictation was prepared with Dragon dictation along with smaller phrase technology. Any transcriptional errors that result from this process are unintentional.

## 2018-10-02 ENCOUNTER — Other Ambulatory Visit: Payer: Self-pay

## 2018-10-02 ENCOUNTER — Ambulatory Visit (INDEPENDENT_AMBULATORY_CARE_PROVIDER_SITE_OTHER): Payer: Medicare Other | Admitting: Family Medicine

## 2018-10-02 ENCOUNTER — Encounter: Payer: Self-pay | Admitting: Family Medicine

## 2018-10-02 VITALS — BP 128/70 | HR 100 | Ht 60.0 in | Wt 234.0 lb

## 2018-10-02 DIAGNOSIS — M545 Low back pain, unspecified: Secondary | ICD-10-CM

## 2018-10-02 DIAGNOSIS — M2550 Pain in unspecified joint: Secondary | ICD-10-CM

## 2018-10-02 DIAGNOSIS — M170 Bilateral primary osteoarthritis of knee: Secondary | ICD-10-CM

## 2018-10-02 DIAGNOSIS — M999 Biomechanical lesion, unspecified: Secondary | ICD-10-CM

## 2018-10-02 DIAGNOSIS — Z23 Encounter for immunization: Secondary | ICD-10-CM | POA: Diagnosis not present

## 2018-10-02 DIAGNOSIS — G8929 Other chronic pain: Secondary | ICD-10-CM

## 2018-10-02 NOTE — Patient Instructions (Addendum)
See me again in 4-6 weeks Wear patellar strap No new vitamins

## 2018-10-03 ENCOUNTER — Encounter: Payer: Self-pay | Admitting: Family Medicine

## 2018-10-03 NOTE — Assessment & Plan Note (Signed)
Decision today to treat with OMT was based on Physical Exam  After verbal consent patient was treated with HVLA, ME, FPR techniques in cervical, thoracic, rib lumbar and sacral areas  Patient tolerated the procedure well with improvement in symptoms  Patient given exercises, stretches and lifestyle modifications  See medications in patient instructions if given  Patient will follow up in 4 weeks 

## 2018-10-03 NOTE — Assessment & Plan Note (Signed)
Severe arthritis of the knee.  Some instability.  Discussed with her patella strap to try to change where the kneecap hits.  Continues to have pain needs replacement but patient is concerned with potential metal allergy

## 2018-10-03 NOTE — Assessment & Plan Note (Signed)
Low back pain overall.  Discussed with activity, home exercises, which activities to do which wants to avoid.  Discussed posture and ergonomics.  Patient will increase activity as tolerated.  Responded fairly well to osteopathic manipulation.

## 2018-10-25 ENCOUNTER — Encounter: Payer: Self-pay | Admitting: Family Medicine

## 2018-10-27 MED ORDER — DOXYCYCLINE HYCLATE 50 MG PO CAPS: ORAL_CAPSULE | ORAL | 3 refills | Status: DC

## 2018-10-30 NOTE — Progress Notes (Signed)
[name] [place], [place] [address] Phone: [phone] Subjective:   [name], am serving as a scribe for Dr. Hulan Saas.   CC: Knee and back pain  [id number]   10/02/2018 Low back pain overall.  Discussed with activity, home exercises, which activities to do which wants to avoid.  Discussed posture and ergonomics.  Patient will increase activity as tolerated.  Responded fairly well to osteopathic manipulation.  Update 10/31/2018 Kerry [name] is a 68 y.o. female coming in with complaint of knee and back pain. Still having some knee pain. Has been using prescription called in last week for dental issues. Was unable to raise her left arm overhead following the flu injection.  Patient did get flu shot at last exam and feels like she did have a reaction.  Continues to have difficulty with the knee.  Wearing a brace on a regular basis but unfortunately continues to have pain.     Past Medical History:  Diagnosis Date  . Abnormal uterine bleeding   . Allergic rhinitis, cause unspecified   . Arthritis of knee, right   . Bursitis of left shoulder   . 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 Chronic pain   . Dyspareunia   . Elevated cholesterol   . Fibroid   . Foot fracture, left 01/2015   and torn tendons  . GERD (gastroesophageal reflux disease)   . HTN (hypertension)   . Kidney stones   . Sleep apnea   . Urinary incontinence    Past Surgical History:  Procedure Laterality Date  .  dilatation and curettage  2000  . CARPAL TUNNEL RELEASE  1998  . CATARACT EXTRACTION Bilateral   . dental implants  2010  . DENTAL SURGERY    . ESOPHAGEAL DILATION  2010  . HAMMER TOE SURGERY     rt  . PELVIC LAPAROSCOPY  1990  . ROTATOR CUFF REPAIR Right 09/2011   Dr. Tamera Punt  . ROTATOR CUFF REPAIR Left 11/11/13   Dr. Tamera Punt   . SEPTOPLASTY    . UMBILICAL HERNIA REPAIR  2010   Social History   Socioeconomic History  . Marital  status: Married    Spouse name: Not on file  . Number of children: Not on file  . Years of education: Not on file  . Highest education level: Not on file  Occupational History  . Occupation: Retired  Scientific laboratory technician  . Financial resource strain: Not on file  . Food insecurity    Worry: Not on file    Inability: Not on file  . Transportation needs    Medical: Not on file    Non-medical: Not on file  Tobacco Use  . Smoking status: Never Smoker  . Smokeless tobacco: Never Used  Substance and Sexual Activity  . Alcohol use: No    Alcohol/week: 0.0 standard drinks  . Drug use: No  . Sexual activity: Not Currently    Partners: Male    Birth control/protection: Post-menopausal  Lifestyle  . Physical activity    Days per week: Not on file    Minutes per session: Not on file  . Stress: Not on file  Relationships  . Social Herbalist on phone: Not on file    Gets together: Not on file    Attends religious service: Not on file    Active member of club or organization: Not on file    Attends meetings  of clubs or organizations: Not on file    Relationship status: Not on file  Other Topics Concern  . Not on file  Social History Narrative  . Not on file   Allergies  Allergen Reactions  . Molds & Smuts Other (See Comments), Palpitations, Shortness Of Breath and Swelling  . Cefixime Swelling  . Celebrex [Celecoxib] Swelling  . Cobalt Other (See Comments)    Per allergist  . Gabapentin Other (See Comments)    Joint pain  . Levofloxacin Other (See Comments)    Muscle and joint pain  . Molybdenum Other (See Comments)    Per allergist  . Other Other (See Comments)    TEQUIN. TEQUIN - JOINT AND MUSCLE PAIN Other Reaction: painful joints TEQUIN. TEQUIN - JOINT AND MUSCLE PAIN Tantal Metal  . Penicillins Hives  . Robaxin [Methocarbamol]   . Tomato Hives  . Adhesive [Tape] Rash    Sensitive to EKG leads; sensitive to 59M Micropore tape  . Dust Mite Extract Itching,  Other (See Comments) and Palpitations  . Tetracycline Hcl Rash    Can take Doxy  . Tetracyclines & Related Rash   Family History  Problem Relation Age of Onset  . Rheumatologic disease Mother   . Diabetes Mother   . Heart attack Father   . Rheumatologic disease Sister   . Arthritis Sister        --Rheumatoid arthritis  . Ovarian cancer Sister 54  . Crohn's disease Brother   . Kidney failure Brother   . Ulcerative colitis Brother   . Glaucoma Brother         Current Outpatient Medications (Hematological):  .  cyanocobalamin (,VITAMIN B-12,) 1000 MCG/ML injection, INJECT 1 ML AS DIRECTED EVERY 14 DAYS  Current Outpatient Medications (Other):  Marland Kitchen  ADVANCED EVENING PRIMROSE OIL PO, Take by mouth. 1 each per day .  Cholecalciferol (VITAMIN D3) 5000 units CAPS, Take 5,000 Units by mouth. Every MWF .  doxycycline (VIBRAMYCIN) 50 MG capsule, 1 tablet 3 times a week .  Misc Natural Products (TART CHERRY ADVANCED PO), 1,200 mg. .  Multiple Vitamin (MULTIVITAMIN) tablet, Take 1 tablet by mouth daily. Every 3 day PhytoMulti .  Omega-3 Fatty Acids (FISH OIL) 1000 MG CAPS, Take by mouth. 1 per day .  Probiotic Product (PROBIOTIC ADVANCED PO), Take by mouth. Patient states taking 5 billion, one capsule daily. Suprema Dophilus probiotic .  Pyridoxine HCl (B-6) 100 MG TABS, 2 out of 3 days not taken on the day that Phytomulti is taken .  SYRINGE-NEEDLE, DISP, 3 ML 25G X 1" 3 ML MISC, Inject B12 every 2 weeks. .  SYRINGE-NEEDLE, DISP, 3 ML 25G X 5/8" 3 ML MISC, Inject B12 once monthly. (Patient taking differently: Inject B12 every 2 weeks) .  vitamin C (ASCORBIC ACID) 500 MG tablet, Take 500 mg by mouth daily. Not taken on the day that Phytomulti is taken    Past medical history, social, surgical and family history all reviewed in electronic medical record.  No pertanent information unless stated regarding to the chief complaint.   Review of Systems:  No headache, visual changes, nausea,  vomiting, diarrhea, constipation, dizziness, abdominal pain, skin rash, fevers, chills, night sweats, weight loss, swollen lymph nodes, , chest pain, shortness of breath, mood changes.  Positive muscle aches and body aches  Objective  Blood pressure (!) 146/82, pulse (!) 102, height 5' (1.524 m), weight 234 lb (106.1 kg), last menstrual period 01/16/2002, SpO2 98 %.    General:  No apparent distress alert and oriented x3 mood and affect normal, dressed appropriately.  Overweight HEENT: Pupils equal, extraocular movements intact  Respiratory: Patient's speak in full sentences and does not appear short of breath  Cardiovascular: Trace lower extremity edema, non tender, no erythema  Skin: Warm dry intact with no signs of infection or rash on extremities or on axial skeleton.  Abdomen: Soft nontender  Neuro: Cranial nerves II through XII are intact, neurovascularly intact in all extremities with 2+ DTRs and 2+ pulses.  Lymph: No lymphadenopathy of posterior or anterior cervical chain or axillae bilaterally.  Gait.  Antalgic severely favoring right knee MSK:  tender with limited range of motion and good stability and symmetric strength and tone of shoulders, elbows, wrist, hip and ankles bilaterally.  Severe arthritic changes of multiple joints Knee: Right valgus deformity noted.  Abnormal thigh to calf ratio.  Tender to palpation over medial and PF joint line.  ROM full in flexion and extension and lower leg rotation. instability with valgus force.  painful patellar compression. Patellar glide with moderate crepitus. Patellar and quadriceps tendons unremarkable. Hamstring and quadriceps strength is normal. Contralateral knee shows arthritic changes but nontender.  Still some mild instability noted.  Back exam does have loss of lordosis with some degenerative scoliosis.  Tightness of the hamstrings and piriformis bilaterally.  Unable to even do Corky Sox test secondary to tightness.  Osteopathic  findings  C2 flexed rotated and side bent right T3 extended rotated and side bent right inhaled third rib T5 extended rotated and side bent left L2 flexed rotated and side bent right Sacrum right on right    Impression and Recommendations:     This case required medical decision making of moderate complexity. The above documentation has been reviewed and is accurate and complete [name], DO       Note: This dictation was prepared with Dragon dictation along with smaller phrase technology. Any transcriptional errors that result from this process are unintentional.

## 2018-10-31 ENCOUNTER — Other Ambulatory Visit: Payer: Self-pay

## 2018-10-31 ENCOUNTER — Encounter: Payer: Self-pay | Admitting: Family Medicine

## 2018-10-31 ENCOUNTER — Ambulatory Visit (INDEPENDENT_AMBULATORY_CARE_PROVIDER_SITE_OTHER): Payer: Medicare Other | Admitting: Family Medicine

## 2018-10-31 VITALS — BP 146/82 | HR 102 | Ht 60.0 in | Wt 234.0 lb

## 2018-10-31 DIAGNOSIS — M170 Bilateral primary osteoarthritis of knee: Secondary | ICD-10-CM

## 2018-10-31 DIAGNOSIS — Z961 Presence of intraocular lens: Secondary | ICD-10-CM | POA: Diagnosis not present

## 2018-10-31 DIAGNOSIS — H35373 Puckering of macula, bilateral: Secondary | ICD-10-CM | POA: Diagnosis not present

## 2018-10-31 DIAGNOSIS — M545 Low back pain, unspecified: Secondary | ICD-10-CM

## 2018-10-31 DIAGNOSIS — H04123 Dry eye syndrome of bilateral lacrimal glands: Secondary | ICD-10-CM | POA: Diagnosis not present

## 2018-10-31 DIAGNOSIS — M999 Biomechanical lesion, unspecified: Secondary | ICD-10-CM

## 2018-10-31 DIAGNOSIS — G8929 Other chronic pain: Secondary | ICD-10-CM | POA: Diagnosis not present

## 2018-10-31 NOTE — Assessment & Plan Note (Signed)
Severe arthritic changes of the back.  No radicular symptoms at the moment.  Responding fairly well to osteopathic manipulation.  Patient will not want any note of injections or surgical intervention.  Has had significant side effects to multiple different medications and will hold on anything at the moment.  Follow-up again in 4 to 5 weeks

## 2018-10-31 NOTE — Patient Instructions (Addendum)
[employer], NOW, [employer] Gravity defyer shoes Body helix tennis elbow brace for the knee See me again in 4-5 weeks

## 2018-10-31 NOTE — Assessment & Plan Note (Signed)
Decision today to treat with OMT was based on Physical Exam  After verbal consent patient was treated with HVLA, ME, FPR techniques in cervical, thoracic, rib lumbar and sacral areas  Patient tolerated the procedure well with improvement in symptoms  Patient given exercises, stretches and lifestyle modifications  See medications in patient instructions if given  Patient will follow up in 4-5 weeks 

## 2018-10-31 NOTE — Assessment & Plan Note (Signed)
Significant.  Discussed the possibility of PRP injections.  Discussed which activities to do which wants to avoid.  Patient is to increase activity slowly over the course the next several weeks.  Follow-up again in 4 to 8 weeks.

## 2018-12-04 ENCOUNTER — Encounter: Payer: Self-pay | Admitting: Family Medicine

## 2018-12-04 ENCOUNTER — Ambulatory Visit (INDEPENDENT_AMBULATORY_CARE_PROVIDER_SITE_OTHER): Payer: Medicare Other | Admitting: Family Medicine

## 2018-12-04 ENCOUNTER — Other Ambulatory Visit: Payer: Self-pay

## 2018-12-04 VITALS — BP 152/104 | HR 115 | Ht 60.0 in

## 2018-12-04 DIAGNOSIS — G8929 Other chronic pain: Secondary | ICD-10-CM

## 2018-12-04 DIAGNOSIS — M999 Biomechanical lesion, unspecified: Secondary | ICD-10-CM

## 2018-12-04 DIAGNOSIS — M545 Low back pain, unspecified: Secondary | ICD-10-CM

## 2018-12-04 NOTE — Progress Notes (Signed)
[name] [place], [place] [address] Phone: [phone] Subjective:    I'm seeing this patient by the request  of:    CC: Neck pain, knee pain follow-up  [id number]   10/31/2018 Severe arthritic changes of the back.  No radicular symptoms at the moment.  Responding fairly well to osteopathic manipulation.  Patient will not want any note of injections or surgical intervention.  Has had significant side effects to multiple different medications and will hold on anything at the moment.  Follow-up again in 4 to 5 weeks  Significant.  Discussed the possibility of PRP injections.  Discussed which activities to do which wants to avoid.  Patient is to increase activity slowly over the course the next several weeks.  Follow-up again in 4 to 8 weeks.  Update 12/04/2018 Kerry [name] is a 68 y.o. female coming in with complaint of neck and knee pain. Has been having increase in shoulder pain. Otherwise back pain is no better. Tried different pair of shoes but changed back due to increase in swelling. On low dose of doxy. Also no change in knee pain. Is using Rolator more at home. Patient has been having more stomach pain since she started doxy. Wants to know if she should continue.      Past Medical History:  Diagnosis Date  . Abnormal uterine bleeding   . Allergic rhinitis, cause unspecified   . Arthritis of knee, right   . Bursitis of left shoulder   . 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 Chronic pain   . Dyspareunia   . Elevated cholesterol   . Fibroid   . Foot fracture, left 01/2015   and torn tendons  . GERD (gastroesophageal reflux disease)   . HTN (hypertension)   . Kidney stones   . Sleep apnea   . Urinary incontinence    Past Surgical History:  Procedure Laterality Date  .  dilatation and curettage  2000  . CARPAL TUNNEL RELEASE  1998  . CATARACT EXTRACTION Bilateral   . dental implants  2010  . DENTAL SURGERY    .  ESOPHAGEAL DILATION  2010  . HAMMER TOE SURGERY     rt  . PELVIC LAPAROSCOPY  1990  . ROTATOR CUFF REPAIR Right 09/2011   Dr. Tamera Punt  . ROTATOR CUFF REPAIR Left 11/11/13   Dr. Tamera Punt   . SEPTOPLASTY    . UMBILICAL HERNIA REPAIR  2010   Social History   Socioeconomic History  . Marital status: Married    Spouse name: Not on file  . Number of children: Not on file  . Years of education: Not on file  . Highest education level: Not on file  Occupational History  . Occupation: Retired  Scientific laboratory technician  . Financial resource strain: Not on file  . Food insecurity    Worry: Not on file    Inability: Not on file  . Transportation needs    Medical: Not on file    Non-medical: Not on file  Tobacco Use  . Smoking status: Never Smoker  . Smokeless tobacco: Never Used  Substance and Sexual Activity  . Alcohol use: No    Alcohol/week: 0.0 standard drinks  . Drug use: No  . Sexual activity: Not Currently    Partners: Male    Birth control/protection: Post-menopausal  Lifestyle  . Physical activity    Days per week: Not on file    Minutes per session: Not  on file  . Stress: Not on file  Relationships  . Social Herbalist on phone: Not on file    Gets together: Not on file    Attends religious service: Not on file    Active member of club or organization: Not on file    Attends meetings of clubs or organizations: Not on file    Relationship status: Not on file  Other Topics Concern  . Not on file  Social History Narrative  . Not on file   Allergies  Allergen Reactions  . Molds & Smuts Other (See Comments), Palpitations, Shortness Of Breath and Swelling  . Cefixime Swelling  . Celebrex [Celecoxib] Swelling  . Cobalt Other (See Comments)    Per allergist  . Gabapentin Other (See Comments)    Joint pain  . Levofloxacin Other (See Comments)    Muscle and joint pain  . Molybdenum Other (See Comments)    Per allergist  . Other Other (See Comments)    TEQUIN.  TEQUIN - JOINT AND MUSCLE PAIN Other Reaction: painful joints TEQUIN. TEQUIN - JOINT AND MUSCLE PAIN Tantal Metal  . Penicillins Hives  . Robaxin [Methocarbamol]   . Tomato Hives  . Adhesive [Tape] Rash    Sensitive to EKG leads; sensitive to 46M Micropore tape  . Dust Mite Extract Itching, Other (See Comments) and Palpitations  . Tetracycline Hcl Rash    Can take Doxy  . Tetracyclines & Related Rash   Family History  Problem Relation Age of Onset  . Rheumatologic disease Mother   . Diabetes Mother   . Heart attack Father   . Rheumatologic disease Sister   . Arthritis Sister        --Rheumatoid arthritis  . Ovarian cancer Sister 23  . Crohn's disease Brother   . Kidney failure Brother   . Ulcerative colitis Brother   . Glaucoma Brother         Current Outpatient Medications (Hematological):  .  cyanocobalamin (,VITAMIN B-12,) 1000 MCG/ML injection, INJECT 1 ML AS DIRECTED EVERY 14 DAYS  Current Outpatient Medications (Other):  Marland Kitchen  ADVANCED EVENING PRIMROSE OIL PO, Take by mouth. 1 each per day .  Cholecalciferol (VITAMIN D3) 5000 units CAPS, Take 5,000 Units by mouth. Every MWF .  doxycycline (VIBRAMYCIN) 50 MG capsule, 1 tablet 3 times a week .  Misc Natural Products (TART CHERRY ADVANCED PO), 1,200 mg. .  Multiple Vitamin (MULTIVITAMIN) tablet, Take 1 tablet by mouth daily. Every 3 day PhytoMulti .  Omega-3 Fatty Acids (FISH OIL) 1000 MG CAPS, Take by mouth. 1 per day .  Probiotic Product (PROBIOTIC ADVANCED PO), Take by mouth. Patient states taking 5 billion, one capsule daily. Suprema Dophilus probiotic .  Pyridoxine HCl (B-6) 100 MG TABS, 2 out of 3 days not taken on the day that Phytomulti is taken .  SYRINGE-NEEDLE, DISP, 3 ML 25G X 1" 3 ML MISC, Inject B12 every 2 weeks. .  SYRINGE-NEEDLE, DISP, 3 ML 25G X 5/8" 3 ML MISC, Inject B12 once monthly. (Patient taking differently: Inject B12 every 2 weeks) .  vitamin C (ASCORBIC ACID) 500 MG tablet, Take 500 mg by mouth  daily. Not taken on the day that Phytomulti is taken    Past medical history, social, surgical and family history all reviewed in electronic medical record.  No pertanent information unless stated regarding to the chief complaint.   Review of Systems:  No headache, visual changes, nausea, vomiting, diarrhea, constipation, dizziness, abdominal  pain, skin rash, fevers, chills, night sweats, weight loss, swollen lymph nodes, chest pain, shortness of breath, mood changes.  Positive muscle aches, body aches  Objective  Blood pressure (!) 152/104, pulse (!) 115, height 5' (1.524 m), last menstrual period 01/16/2002, SpO2 97 %. Systems examined below as of    General: No apparent distress alert and oriented x3 mood and affect normal, dressed appropriately.  HEENT: Pupils equal, extraocular movements intact  Respiratory: Patient's speak in full sentences and does not appear short of breath  Cardiovascular: No lower extremity edema, non tender, no erythema  Skin: Warm dry intact with no signs of infection or rash on extremities or on axial skeleton.  Abdomen: Soft nontender  Neuro: Cranial nerves II through XII are intact, neurovascularly intact in all extremities with 2+ DTRs and 2+ pulses.  Lymph: No lymphadenopathy of posterior or anterior cervical chain or axillae bilaterally.  Gait severely antalgic MSK:  tender with limited range of motion and good stability and symmetric strength and tone of shoulders, elbows, wrist, hip, and ankles bilaterally.  Mild synovitis of the hands noted significant arthritic changes of multiple joints Knee: Bilateral valgus deformity noted. Large thigh to calf ratio.  Tender to palpation over medial and PF joint line.  5 degrees of extension in the last 10 degrees of flexion instability with valgus force.  painful patellar compression. Patellar glide with moderate crepitus. Patellar and quadriceps tendons unremarkable. Hamstring and quadriceps strength is  normal.   Back exam significant tightness, poor core strength.  Tenderness throughout the paraspinal musculature of the lumbar spine.  Osteopathic findings  C6 flexed rotated and side bent left T4 extended rotated and side bent right inhaled rib T6 extended rotated and side bent left L2 flexed rotated and side bent right Sacrum right on right    Impression and Recommendations:     This case required medical decision making of moderate complexity. The above documentation has been reviewed and is accurate and complete [name], DO       Note: This dictation was prepared with Dragon dictation along with smaller phrase technology. Any transcriptional errors that result from this process are unintentional.

## 2018-12-04 NOTE — Patient Instructions (Signed)
Stop doxy and see how that goes [employer] See me again in 4-6 weeks

## 2018-12-06 ENCOUNTER — Encounter: Payer: Self-pay | Admitting: Family Medicine

## 2018-12-06 NOTE — Assessment & Plan Note (Addendum)
Decision today to treat with OMT was based on Physical Exam  After verbal consent patient was treated with HVLA, ME, FPR techniques in cervical, thoracic, rib lumbar and sacral areas  Patient tolerated the procedure well with improvement in symptoms  Patient given exercises, stretches and lifestyle modifications  See medications in patient instructions if given  Patient will follow up in 4-6 weeks 

## 2018-12-06 NOTE — Assessment & Plan Note (Signed)
Severe degenerative disc disease, significant tightness.  Likely seronegative rheumatoid arthritis with patient still does not want to change medical management.  We will continue to attempt osteopathic manipulation and conservative therapy.  Follow-up again in 4-8 weeks

## 2018-12-26 ENCOUNTER — Other Ambulatory Visit: Payer: Self-pay | Admitting: Family Medicine

## 2019-01-13 NOTE — Progress Notes (Deleted)
[name] [place], [place] [address] Phone: [phone] Subjective:    I'm seeing this patient by the request  of:    CC:   [id number]  Kerry [name] is a 68 y.o. female coming in with complaint of ***  Onset-  Location Duration-  Character- Aggravating factors- Reliving factors-  Therapies tried-  Severity-     Past Medical History:  Diagnosis Date  . Abnormal uterine bleeding   . Allergic rhinitis, cause unspecified   . Arthritis of knee, right   . Bursitis of left shoulder   . 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 Chronic pain   . Dyspareunia   . Elevated cholesterol   . Fibroid   . Foot fracture, left 01/2015   and torn tendons  . GERD (gastroesophageal reflux disease)   . HTN (hypertension)   . Kidney stones   . Sleep apnea   . Urinary incontinence    Past Surgical History:  Procedure Laterality Date  .  dilatation and curettage  2000  . CARPAL TUNNEL RELEASE  1998  . CATARACT EXTRACTION Bilateral   . dental implants  2010  . DENTAL SURGERY    . ESOPHAGEAL DILATION  2010  . HAMMER TOE SURGERY     rt  . PELVIC LAPAROSCOPY  1990  . ROTATOR CUFF REPAIR Right 09/2011   Dr. Tamera Punt  . ROTATOR CUFF REPAIR Left 11/11/13   Dr. Tamera Punt   . SEPTOPLASTY    . UMBILICAL HERNIA REPAIR  2010   Social History   Socioeconomic History  . Marital status: Married    Spouse name: Not on file  . Number of children: Not on file  . Years of education: Not on file  . Highest education level: Not on file  Occupational History  . Occupation: Retired  Tobacco Use  . Smoking status: Never Smoker  . Smokeless tobacco: Never Used  Substance and Sexual Activity  . Alcohol use: No    Alcohol/week: 0.0 standard drinks  . Drug use: No  . Sexual activity: Not Currently    Partners: Male    Birth control/protection: Post-menopausal  Other Topics Concern  . Not on file  Social History Narrative  . Not on file    Social Determinants of Health   Financial Resource Strain:   . Difficulty of Paying Living Expenses: Not on file  Food Insecurity:   . Worried About Charity fundraiser in the Last Year: Not on file  . Ran Out of Food in the Last Year: Not on file  Transportation Needs:   . Lack of Transportation (Medical): Not on file  . Lack of Transportation (Non-Medical): Not on file  Physical Activity:   . Days of Exercise per Week: Not on file  . Minutes of Exercise per Session: Not on file  Stress:   . Feeling of Stress : Not on file  Social Connections:   . Frequency of Communication with Friends and Family: Not on file  . Frequency of Social Gatherings with Friends and Family: Not on file  . Attends Religious Services: Not on file  . Active Member of Clubs or Organizations: Not on file  . Attends Archivist Meetings: Not on file  . Marital Status: Not on file   Allergies  Allergen Reactions  . Molds & Smuts Other (See Comments), Palpitations, Shortness Of Breath and Swelling  . Cefixime Swelling  . Celebrex [Celecoxib] Swelling  .  Cobalt Other (See Comments)    Per allergist  . Gabapentin Other (See Comments)    Joint pain  . Levofloxacin Other (See Comments)    Muscle and joint pain  . Molybdenum Other (See Comments)    Per allergist  . Other Other (See Comments)    TEQUIN. TEQUIN - JOINT AND MUSCLE PAIN Other Reaction: painful joints TEQUIN. TEQUIN - JOINT AND MUSCLE PAIN Tantal Metal  . Penicillins Hives  . Robaxin [Methocarbamol]   . Tomato Hives  . Adhesive [Tape] Rash    Sensitive to EKG leads; sensitive to 58M Micropore tape  . Dust Mite Extract Itching, Other (See Comments) and Palpitations  . Tetracycline Hcl Rash    Can take Doxy  . Tetracyclines & Related Rash   Family History  Problem Relation Age of Onset  . Rheumatologic disease Mother   . Diabetes Mother   . Heart attack Father   . Rheumatologic disease Sister   . Arthritis Sister         --Rheumatoid arthritis  . Ovarian cancer Sister 45  . Crohn's disease Brother   . Kidney failure Brother   . Ulcerative colitis Brother   . Glaucoma Brother         Current Outpatient Medications (Hematological):  .  cyanocobalamin (,VITAMIN B-12,) 1000 MCG/ML injection, INJECT 1 ML AS DIRECTED EVERY 14 DAYS  Current Outpatient Medications (Other):  Marland Kitchen  ADVANCED EVENING PRIMROSE OIL PO, Take by mouth. 1 each per day .  Cholecalciferol (VITAMIN D3) 5000 units CAPS, Take 5,000 Units by mouth. Every MWF .  doxycycline (VIBRAMYCIN) 50 MG capsule, 1 tablet 3 times a week .  Misc Natural Products (TART CHERRY ADVANCED PO), 1,200 mg. .  Multiple Vitamin (MULTIVITAMIN) tablet, Take 1 tablet by mouth daily. Every 3 day PhytoMulti .  Omega-3 Fatty Acids (FISH OIL) 1000 MG CAPS, Take by mouth. 1 per day .  Probiotic Product (PROBIOTIC ADVANCED PO), Take by mouth. Patient states taking 5 billion, one capsule daily. Suprema Dophilus probiotic .  Pyridoxine HCl (B-6) 100 MG TABS, 2 out of 3 days not taken on the day that Phytomulti is taken .  SYRINGE-NEEDLE, DISP, 3 ML 25G X 1" 3 ML MISC, Inject B12 every 2 weeks. .  SYRINGE-NEEDLE, DISP, 3 ML 25G X 5/8" 3 ML MISC, Inject B12 once monthly. (Patient taking differently: Inject B12 every 2 weeks) .  vitamin C (ASCORBIC ACID) 500 MG tablet, Take 500 mg by mouth daily. Not taken on the day that Phytomulti is taken    Past medical history, social, surgical and family history all reviewed in electronic medical record.  No pertanent information unless stated regarding to the chief complaint.   Review of Systems:  No headache, visual changes, nausea, vomiting, diarrhea, constipation, dizziness, abdominal pain, skin rash, fevers, chills, night sweats, weight loss, swollen lymph nodes, body aches, joint swelling, muscle aches, chest pain, shortness of breath, mood changes.   Objective  Last menstrual period 01/16/2002. Systems examined below as of      General: No apparent distress alert and oriented x3 mood and affect normal, dressed appropriately.  HEENT: Pupils equal, extraocular movements intact  Respiratory: Patient's speak in full sentences and does not appear short of breath  Cardiovascular: No lower extremity edema, non tender, no erythema  Skin: Warm dry intact with no signs of infection or rash on extremities or on axial skeleton.  Abdomen: Soft nontender  Neuro: Cranial nerves II through XII are intact, neurovascularly intact in  all extremities with 2+ DTRs and 2+ pulses.  Lymph: No lymphadenopathy of posterior or anterior cervical chain or axillae bilaterally.  Gait normal with good balance and coordination.  MSK:  Non tender with full range of motion and good stability and symmetric strength and tone of shoulders, elbows, wrist, hip, knee and ankles bilaterally.     Impression and Recommendations:     This case required medical decision making of moderate complexity. The above documentation has been reviewed and is accurate and complete [name], DO       Note: This dictation was prepared with Dragon dictation along with smaller phrase technology. Any transcriptional errors that result from this process are unintentional.

## 2019-01-14 ENCOUNTER — Ambulatory Visit: Payer: Medicare Other | Admitting: Family Medicine

## 2019-01-14 NOTE — Progress Notes (Deleted)
[name] [place], [place] [address] Phone: [phone] Subjective:    I'm seeing this patient by the request  of:    CC:   [id number]  Kerry [name] is a 68 y.o. female coming in with complaint of ***  Onset-  Location Duration-  Character- Aggravating factors- Reliving factors-  Therapies tried-  Severity-     Past Medical History:  Diagnosis Date  . Abnormal uterine bleeding   . Allergic rhinitis, cause unspecified   . Arthritis of knee, right   . Bursitis of left shoulder   . 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 Chronic pain   . Dyspareunia   . Elevated cholesterol   . Fibroid   . Foot fracture, left 01/2015   and torn tendons  . GERD (gastroesophageal reflux disease)   . HTN (hypertension)   . Kidney stones   . Sleep apnea   . Urinary incontinence    Past Surgical History:  Procedure Laterality Date  .  dilatation and curettage  2000  . CARPAL TUNNEL RELEASE  1998  . CATARACT EXTRACTION Bilateral   . dental implants  2010  . DENTAL SURGERY    . ESOPHAGEAL DILATION  2010  . HAMMER TOE SURGERY     rt  . PELVIC LAPAROSCOPY  1990  . ROTATOR CUFF REPAIR Right 09/2011   Dr. Tamera Punt  . ROTATOR CUFF REPAIR Left 11/11/13   Dr. Tamera Punt   . SEPTOPLASTY    . UMBILICAL HERNIA REPAIR  2010   Social History   Socioeconomic History  . Marital status: Married    Spouse name: Not on file  . Number of children: Not on file  . Years of education: Not on file  . Highest education level: Not on file  Occupational History  . Occupation: Retired  Tobacco Use  . Smoking status: Never Smoker  . Smokeless tobacco: Never Used  Substance and Sexual Activity  . Alcohol use: No    Alcohol/week: 0.0 standard drinks  . Drug use: No  . Sexual activity: Not Currently    Partners: Male    Birth control/protection: Post-menopausal  Other Topics Concern  . Not on file  Social History Narrative  . Not on file    Social Determinants of Health   Financial Resource Strain:   . Difficulty of Paying Living Expenses: Not on file  Food Insecurity:   . Worried About Charity fundraiser in the Last Year: Not on file  . Ran Out of Food in the Last Year: Not on file  Transportation Needs:   . Lack of Transportation (Medical): Not on file  . Lack of Transportation (Non-Medical): Not on file  Physical Activity:   . Days of Exercise per Week: Not on file  . Minutes of Exercise per Session: Not on file  Stress:   . Feeling of Stress : Not on file  Social Connections:   . Frequency of Communication with Friends and Family: Not on file  . Frequency of Social Gatherings with Friends and Family: Not on file  . Attends Religious Services: Not on file  . Active Member of Clubs or Organizations: Not on file  . Attends Archivist Meetings: Not on file  . Marital Status: Not on file   Allergies  Allergen Reactions  . Molds & Smuts Other (See Comments), Palpitations, Shortness Of Breath and Swelling  . Cefixime Swelling  . Celebrex [Celecoxib] Swelling  .  Cobalt Other (See Comments)    Per allergist  . Gabapentin Other (See Comments)    Joint pain  . Levofloxacin Other (See Comments)    Muscle and joint pain  . Molybdenum Other (See Comments)    Per allergist  . Other Other (See Comments)    TEQUIN. TEQUIN - JOINT AND MUSCLE PAIN Other Reaction: painful joints TEQUIN. TEQUIN - JOINT AND MUSCLE PAIN Tantal Metal  . Penicillins Hives  . Robaxin [Methocarbamol]   . Tomato Hives  . Adhesive [Tape] Rash    Sensitive to EKG leads; sensitive to 48M Micropore tape  . Dust Mite Extract Itching, Other (See Comments) and Palpitations  . Tetracycline Hcl Rash    Can take Doxy  . Tetracyclines & Related Rash   Family History  Problem Relation Age of Onset  . Rheumatologic disease Mother   . Diabetes Mother   . Heart attack Father   . Rheumatologic disease Sister   . Arthritis Sister         --Rheumatoid arthritis  . Ovarian cancer Sister 61  . Crohn's disease Brother   . Kidney failure Brother   . Ulcerative colitis Brother   . Glaucoma Brother         Current Outpatient Medications (Hematological):  .  cyanocobalamin (,VITAMIN B-12,) 1000 MCG/ML injection, INJECT 1 ML AS DIRECTED EVERY 14 DAYS  Current Outpatient Medications (Other):  Marland Kitchen  ADVANCED EVENING PRIMROSE OIL PO, Take by mouth. 1 each per day .  Cholecalciferol (VITAMIN D3) 5000 units CAPS, Take 5,000 Units by mouth. Every MWF .  doxycycline (VIBRAMYCIN) 50 MG capsule, 1 tablet 3 times a week .  Misc Natural Products (TART CHERRY ADVANCED PO), 1,200 mg. .  Multiple Vitamin (MULTIVITAMIN) tablet, Take 1 tablet by mouth daily. Every 3 day PhytoMulti .  Omega-3 Fatty Acids (FISH OIL) 1000 MG CAPS, Take by mouth. 1 per day .  Probiotic Product (PROBIOTIC ADVANCED PO), Take by mouth. Patient states taking 5 billion, one capsule daily. Suprema Dophilus probiotic .  Pyridoxine HCl (B-6) 100 MG TABS, 2 out of 3 days not taken on the day that Phytomulti is taken .  SYRINGE-NEEDLE, DISP, 3 ML 25G X 1" 3 ML MISC, Inject B12 every 2 weeks. .  SYRINGE-NEEDLE, DISP, 3 ML 25G X 5/8" 3 ML MISC, Inject B12 once monthly. (Patient taking differently: Inject B12 every 2 weeks) .  vitamin C (ASCORBIC ACID) 500 MG tablet, Take 500 mg by mouth daily. Not taken on the day that Phytomulti is taken    Past medical history, social, surgical and family history all reviewed in electronic medical record.  No pertanent information unless stated regarding to the chief complaint.   Review of Systems:  No headache, visual changes, nausea, vomiting, diarrhea, constipation, dizziness, abdominal pain, skin rash, fevers, chills, night sweats, weight loss, swollen lymph nodes, body aches, joint swelling, muscle aches, chest pain, shortness of breath, mood changes.   Objective  Last menstrual period 01/16/2002. Systems examined below as of      General: No apparent distress alert and oriented x3 mood and affect normal, dressed appropriately.  HEENT: Pupils equal, extraocular movements intact  Respiratory: Patient's speak in full sentences and does not appear short of breath  Cardiovascular: No lower extremity edema, non tender, no erythema  Skin: Warm dry intact with no signs of infection or rash on extremities or on axial skeleton.  Abdomen: Soft nontender  Neuro: Cranial nerves II through XII are intact, neurovascularly intact in  all extremities with 2+ DTRs and 2+ pulses.  Lymph: No lymphadenopathy of posterior or anterior cervical chain or axillae bilaterally.  Gait normal with good balance and coordination.  MSK:  Non tender with full range of motion and good stability and symmetric strength and tone of shoulders, elbows, wrist, hip, knee and ankles bilaterally.     Impression and Recommendations:     This case required medical decision making of moderate complexity. The above documentation has been reviewed and is accurate and complete [name], DO       Note: This dictation was prepared with Dragon dictation along with smaller phrase technology. Any transcriptional errors that result from this process are unintentional.

## 2019-01-17 DIAGNOSIS — E790 Hyperuricemia without signs of inflammatory arthritis and tophaceous disease: Secondary | ICD-10-CM

## 2019-01-17 HISTORY — DX: Hyperuricemia without signs of inflammatory arthritis and tophaceous disease: E79.0

## 2019-02-05 NOTE — Telephone Encounter (Signed)
Info from patient printed so that TP may review it with patient tomorrow during visit

## 2019-02-06 ENCOUNTER — Other Ambulatory Visit: Payer: Self-pay

## 2019-02-06 ENCOUNTER — Ambulatory Visit: Payer: Medicare Other | Admitting: Pulmonary Disease

## 2019-02-06 ENCOUNTER — Ambulatory Visit (INDEPENDENT_AMBULATORY_CARE_PROVIDER_SITE_OTHER): Payer: Medicare Other | Admitting: Adult Health

## 2019-02-06 DIAGNOSIS — G4733 Obstructive sleep apnea (adult) (pediatric): Secondary | ICD-10-CM | POA: Diagnosis not present

## 2019-02-06 DIAGNOSIS — Z9989 Dependence on other enabling machines and devices: Secondary | ICD-10-CM

## 2019-02-06 NOTE — Progress Notes (Signed)
Reviewed and agree with assessment/plan.   Casandra Dallaire, MD [facility] 01/12/2016, 12:24 PM Pager:  [phone]  

## 2019-02-06 NOTE — Patient Instructions (Signed)
Keep up good work.  Continue on CPAP  At bedtime  .  Work on healthy weight .  Do not drive if sleepy .  Follow up with Dr. Halford Chessman  In 1 year and As needed.

## 2019-02-06 NOTE — Progress Notes (Signed)
Virtual Visit via Telephone Note  I connected with Kerry [name] on 02/06/19 at  1:30 PM EST by telephone and verified that I am speaking with the correct person using two identifiers.  Location: Patient: Home  Provider: Office    I discussed the limitations, risks, security and privacy concerns of performing an evaluation and management service by telephone and the availability of in person appointments. I also discussed with the patient that there may be a patient responsible charge related to this service. The patient expressed understanding and agreed to proceed.   History of Present Illness: 69 year old female followed for moderate sleep apnea on nocturnal CPAP  Today's televisit is a 1 year follow-up for sleep apnea.  Patient is maintained on nocturnal CPAP for her underlying moderate sleep apnea.  Patient says she is doing well on CPAP.  She wears it every night.  She says she likes her mask now she has a nasal mask however occasionally when she moves she does some air that seems up to her eyes.  But considers it mild.  Patient says she feels rested with no increased daytime sleepiness.  Download shows excellent compliance with 100% usage.  Daily average usage around 5 hours.  Patient is on auto CPAP 5 to 15 cm H2O.  AHI 0.1. Patient says she is been having some issues with her homecare company however has gotten them worked out recently.  However does think that going for that she may want to change homecare companies.  Patient has multiple questions regarding COVID-19 vaccine.  Patient information was given.  Patient has set her vaccine date for next week.   Observations/Objective: HST 03/04/15 >> AHI 21.6, SpO2 low 80% Auto CPAP 07/01/16 to 07/30/16 >>used on 30 of 30 nights with average 5 hrs 54 min. Average AHI 0.3 with median CPAP 6 and 95 th percentile CPAP 8 cm H2O  Assessment and Plan: Moderate obstructive sleep apnea excellent control and compliance on nocturnal  CPAP.  Plan  Patient Instructions  Keep up good work.  Continue on CPAP  At bedtime  .  Work on healthy weight .  Do not drive if sleepy .  Follow up with Dr. Halford Chessman  In 1 year and As needed.       Follow Up Instructions: Follow-up in 1 year and as needed   I discussed the assessment and treatment plan with the patient. The patient was provided an opportunity to ask questions and all were answered. The patient agreed with the plan and demonstrated an understanding of the instructions.   The patient was advised to call back or seek an in-person evaluation if the symptoms worsen or if the condition fails to improve as anticipated.  I provided 22  minutes of non-face-to-face time during this encounter.   Rexene Edison, NP

## 2019-02-12 ENCOUNTER — Ambulatory Visit: Payer: Medicare Other

## 2019-02-14 ENCOUNTER — Ambulatory Visit: Payer: Medicare Other

## 2019-02-18 ENCOUNTER — Encounter: Payer: Self-pay | Admitting: Family Medicine

## 2019-02-19 ENCOUNTER — Encounter: Payer: Self-pay | Admitting: Family Medicine

## 2019-02-22 ENCOUNTER — Ambulatory Visit: Payer: Medicare Other | Attending: Internal Medicine

## 2019-02-22 DIAGNOSIS — Z23 Encounter for immunization: Secondary | ICD-10-CM | POA: Insufficient documentation

## 2019-02-22 NOTE — Progress Notes (Signed)
   Covid-19 Vaccination Clinic  Name:  DONNIELLE YECK    MRN: [id number] DOB: [birth year]  02/22/2019  Ms. Carmouche was observed post Covid-19 immunization for 30 minutes based on pre-vaccination screening without incidence. She was provided with Vaccine Information Sheet and instruction to access the V-Safe system.   Ms. Walkup was instructed to call 911 with any severe reactions post vaccine: Marland Kitchen Difficulty breathing  . Swelling of your face and throat  . A fast heartbeat  . A bad rash all over your body  . Dizziness and weakness    Immunizations Administered    Name Date Dose VIS Date Route   Pfizer COVID-19 Vaccine 02/22/2019  3:30 PM 0.3 mL 12/27/2018 Intramuscular   Manufacturer: [place]   Lot: [id number]   [place]: [id number]

## 2019-02-27 ENCOUNTER — Encounter: Payer: Self-pay | Admitting: Family Medicine

## 2019-02-27 ENCOUNTER — Other Ambulatory Visit: Payer: Self-pay

## 2019-02-27 ENCOUNTER — Ambulatory Visit (INDEPENDENT_AMBULATORY_CARE_PROVIDER_SITE_OTHER): Payer: Medicare Other | Admitting: Family Medicine

## 2019-02-27 VITALS — BP 142/98 | HR 96 | Ht 60.0 in | Wt 240.0 lb

## 2019-02-27 DIAGNOSIS — G8929 Other chronic pain: Secondary | ICD-10-CM

## 2019-02-27 DIAGNOSIS — M545 Low back pain, unspecified: Secondary | ICD-10-CM

## 2019-02-27 DIAGNOSIS — M999 Biomechanical lesion, unspecified: Secondary | ICD-10-CM | POA: Diagnosis not present

## 2019-02-27 DIAGNOSIS — G5602 Carpal tunnel syndrome, left upper limb: Secondary | ICD-10-CM | POA: Diagnosis not present

## 2019-02-27 DIAGNOSIS — M170 Bilateral primary osteoarthritis of knee: Secondary | ICD-10-CM | POA: Diagnosis not present

## 2019-02-27 NOTE — Assessment & Plan Note (Signed)
Carpal tunnel with the differentiation of the possibility of cervical radiculopathy.  May need to consider the possibility of nerve conduction study or possibly therapeutic and diagnostic injection of the carpal tunnel.  Patient will consider both of these.  Discussed Cymbalta that could help with some of the nerve pain as well.

## 2019-02-27 NOTE — Patient Instructions (Addendum)
Good to see you  regarding the numbness, a couple ideas woud be either a carpal tunnel injection or a nerve conduction test to help figure out where it is coming from  theragun for the trigger points.  Consider dry needling in PT after second vaccine injection  The mounth sounds a little like complex regional pain syundrome at this time. Read about cymbalta and see what you think but could help with some of the pain for sure.  Maybe consider another orthodontist just to get info Water aerobics 8 days after second injection I think could be a possibility  I think Dr. Jeanelle Malling would be fine or consider looking into [facility] for more of a different approach and possible yeast overgrowth  Multi-vitamin would take 1/2 a pill too much vitamin A  Try to remind me to wear my other shoes next time See me again in 6 weeks

## 2019-02-27 NOTE — Assessment & Plan Note (Signed)
Chronic problem with exacerbation.  Secondary to patient's severe knee arthritis as well.  Continues to have some difficulty with even doing more than regular daily activities.  Having difficulty with ambulation.  Patient does respond somewhat though to osteopathic manipulation.  We discussed the possibility of Cymbalta to help with some of the pain and the radicular symptoms.  Patient will consider it.  Follow-up again in 4 weeks

## 2019-02-27 NOTE — Assessment & Plan Note (Signed)
Decision today to treat with OMT was based on Physical Exam  After verbal consent patient was treated with HVLA, ME, FPR techniques in cervical, thoracic, rib,  lumbar and sacral areas  Patient tolerated the procedure well with improvement in symptoms  Patient given exercises, stretches and lifestyle modifications  See medications in patient instructions if given  Patient will follow up in 4-8 weeks 

## 2019-02-27 NOTE — Progress Notes (Signed)
[place] Phone: [phone] Subjective:   [name], am serving as a scribe for Dr. Hulan Saas. This visit occurred during the SARS-CoV-2 public health emergency.  Safety protocols were in place, including screening questions prior to the visit, additional usage of staff PPE, and extensive cleaning of exam room while observing appropriate contact time as indicated for disinfecting solutions.   I'm seeing this patient by the request  of:  Street, [name], MD  CC: Multiple complaints  [id number]   12/04/2018 Severe degenerative disc disease, significant tightness.  Likely seronegative rheumatoid arthritis with patient still does not want to change medical management.  We will continue to attempt osteopathic manipulation and conservative therapy.  Follow-up again in 4-8 weeks  Update 02/27/2019 Kerry [name] is a 69 y.o. female coming in with complaint of neck pain. Having tightness in her neck due to not being able to come in for OMT.  Increasing tightness, has been reading on a more regular basis.  Patient is having intermittent numbness in the hand as well.  Left-sided.  History of carpal tunnel on the contralateral side.  Feels like she is having worsening symptoms that are very consistent with the contralateral side.  Continues to have jaw pain and is unable to wear dentures.  Did have all her implants removed.  Following up with her orthodontist in the near future  Is having high BP recently.  Patient thinks it may be secondary to pain.  Is having trouble with hip flexion on the right side sine falling on the right knee. Is using a rollator to ambulate which is aggravating her shoulders.  Patient has severe arthritis of the right knee.  Walking with a Rollator.  Continues to have difficulty on a daily basis.  Unable to do much more than daily activities.   Patient did send multiple questions.  I  reviewed these before the office visit and took greater than 35 minutes detailing plans for each 1.  Cannot see the information in a MyChart message.  Past Medical History:  Diagnosis Date  . Abnormal uterine bleeding   . Allergic rhinitis, cause unspecified   . Arthritis of knee, right   . Bursitis of left shoulder   . 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 Chronic pain   . Dyspareunia   . Elevated cholesterol   . Fibroid   . Foot fracture, left 01/2015   and torn tendons  . GERD (gastroesophageal reflux disease)   . HTN (hypertension)   . Kidney stones   . Sleep apnea   . Urinary incontinence    Past Surgical History:  Procedure Laterality Date  .  dilatation and curettage  2000  . CARPAL TUNNEL RELEASE  1998  . CATARACT EXTRACTION Bilateral   . dental implants  2010  . DENTAL SURGERY    . ESOPHAGEAL DILATION  2010  . HAMMER TOE SURGERY     rt  . PELVIC LAPAROSCOPY  1990  . ROTATOR CUFF REPAIR Right 09/2011   Dr. Tamera Punt  . ROTATOR CUFF REPAIR Left 11/11/13   Dr. Tamera Punt   . SEPTOPLASTY    . UMBILICAL HERNIA REPAIR  2010   Social History   Socioeconomic History  . Marital status: Married    Spouse name: Not on file  . Number of children: Not on file  . Years of education: Not on file  . Highest education level: Not on  file  Occupational History  . Occupation: Retired  Tobacco Use  . Smoking status: Never Smoker  . Smokeless tobacco: Never Used  Substance and Sexual Activity  . Alcohol use: No    Alcohol/week: 0.0 standard drinks  . Drug use: No  . Sexual activity: Not Currently    Partners: Male    Birth control/protection: Post-menopausal  Other Topics Concern  . Not on file  Social History Narrative  . Not on file   Social Determinants of Health   Financial Resource Strain:   . Difficulty of Paying Living Expenses: Not on file  Food Insecurity:   . Worried About Charity fundraiser in the Last Year: Not on file  . Ran Out of Food in the Last  Year: Not on file  Transportation Needs:   . Lack of Transportation (Medical): Not on file  . Lack of Transportation (Non-Medical): Not on file  Physical Activity:   . Days of Exercise per Week: Not on file  . Minutes of Exercise per Session: Not on file  Stress:   . Feeling of Stress : Not on file  Social Connections:   . Frequency of Communication with Friends and Family: Not on file  . Frequency of Social Gatherings with Friends and Family: Not on file  . Attends Religious Services: Not on file  . Active Member of Clubs or Organizations: Not on file  . Attends Archivist Meetings: Not on file  . Marital Status: Not on file   Allergies  Allergen Reactions  . Molds & Smuts Other (See Comments), Palpitations, Shortness Of Breath and Swelling  . Cefixime Swelling  . Celebrex [Celecoxib] Swelling  . Cobalt Other (See Comments)    Per allergist  . Gabapentin Other (See Comments)    Joint pain  . Levofloxacin Other (See Comments)    Muscle and joint pain  . Molybdenum Other (See Comments)    Per allergist  . Other Other (See Comments)    TEQUIN. TEQUIN - JOINT AND MUSCLE PAIN Other Reaction: painful joints TEQUIN. TEQUIN - JOINT AND MUSCLE PAIN Tantal Metal  . Penicillins Hives  . Robaxin [Methocarbamol]   . Tomato Hives  . Adhesive [Tape] Rash    Sensitive to EKG leads; sensitive to 69M Micropore tape  . Dust Mite Extract Itching, Other (See Comments) and Palpitations  . Tetracycline Hcl Rash    Can take Doxy  . Tetracyclines & Related Rash   Family History  Problem Relation Age of Onset  . Rheumatologic disease Mother   . Diabetes Mother   . Heart attack Father   . Rheumatologic disease Sister   . Arthritis Sister        --Rheumatoid arthritis  . Ovarian cancer Sister 27  . Crohn's disease Brother   . Kidney failure Brother   . Ulcerative colitis Brother   . Glaucoma Brother         Current Outpatient Medications (Hematological):  .   cyanocobalamin (,VITAMIN B-12,) 1000 MCG/ML injection, INJECT 1 ML AS DIRECTED EVERY 14 DAYS  Current Outpatient Medications (Other):  Marland Kitchen  Multiple Vitamin (MULTIVITAMIN) tablet, Take 1 tablet by mouth daily. Super Multi by [employer] .  Cholecalciferol (VITAMIN D3) 5000 units CAPS, Take 5,000 Units by mouth. Every other day .  Misc Natural Products (TART CHERRY ADVANCED PO), 800 mg.  .  Omega-3 Fatty Acids (FISH OIL) 1000 MG CAPS, Take by mouth. 1 per day .  Probiotic Product (PROBIOTIC ADVANCED PO), Take by  mouth. Patient states taking 5 billion, one capsule daily. Suprema Dophilus probiotic .  SYRINGE-NEEDLE, DISP, 3 ML 25G X 1" 3 ML MISC, Inject B12 every 2 weeks. .  vitamin C (ASCORBIC ACID) 500 MG tablet, Take 500 mg by mouth daily. Not taken on the day that Phytomulti is taken   Reviewed prior external information including notes and imaging from  primary care provider As well as notes that were available from care everywhere and other healthcare systems.  Past medical history, social, surgical and family history all reviewed in electronic medical record.  No pertanent information unless stated regarding to the chief complaint.   Review of Systems:  No headache, visual changes, nausea, vomiting, diarrhea, constipation, dizziness, abdominal pain, skin rash, fevers, chills, night sweats, weight loss, swollen lymph nodes,  chest pain, shortness of breath, mood changes. POSITIVE muscle aches, body aches, intermittent joint swelling  Objective  Blood pressure (!) 142/98, pulse 96, height 5' (1.524 m), weight 240 lb (108.9 kg), last menstrual period 01/16/2002, SpO2 97 %.   General: No apparent distress alert and oriented x3 mood and affect normal, dressed appropriately.  Overweight HEENT: Pupils equal, extraocular movements intact  Respiratory: Patient's speak in full sentences and does not appear short of breath  Cardiovascular: Trace lower extremity edema, non tender, no erythema    Skin: Warm dry intact with no signs of infection or rash on extremities or on axial skeleton.  Abdomen: Soft nontender  Neuro: Cranial nerves II through XII are intact, neurovascularly intact in all extremities with 2+ DTRs and 2+ pulses.  Lymph: No lymphadenopathy of posterior or anterior cervical chain or axillae bilaterally.  Gait severely antalgic favoring knee MSK: Severe arthritic changes of multiple joints  Neck exam does have loss of lordosis.  Positive Spurling's with mild radicular symptoms.  Seems to be left-sided.  Mild positive Tinel's also noted in the left wrist area.   Back exam does have some loss of lordosis.  Tender to palpation diffusely in the paraspinal musculature of the lumbar spine right greater than left.  No difficulty with range of motion in all planes.  Patient has severe tightness of the hamstrings bilaterally.  Unable to do [place] secondary to body habitus.  Osteopathic findings C3 flexed rotated and side bent left C7 flexed rotated and side bent left T3 extended rotated and side bent right inhaled third rib T10 extended rotated and side bent left L2 flexed rotated and side bent right L4 flexed rotated and side bent left Sacrum right on right    Impression and Recommendations:     This case required medical decision making of moderate complexity. The above documentation has been reviewed and is accurate and complete [name], DO       Note: This dictation was prepared with Dragon dictation along with smaller phrase technology. Any transcriptional errors that result from this process are unintentional.

## 2019-02-27 NOTE — Assessment & Plan Note (Signed)
Discussed with patient in great length.  Patient is on bone-on-bone on this knee and does have instability.  We discussed the possibility of replacement again.  Patient is concerned secondary to the allergy to different metals.  Encouraged her though to consider it because patient's quality of life continues to seem to decline.  Chronic problem with exacerbation

## 2019-03-14 ENCOUNTER — Other Ambulatory Visit: Payer: Self-pay

## 2019-03-14 ENCOUNTER — Ambulatory Visit (INDEPENDENT_AMBULATORY_CARE_PROVIDER_SITE_OTHER): Payer: Medicare Other | Admitting: Family Medicine

## 2019-03-14 ENCOUNTER — Encounter: Payer: Self-pay | Admitting: Family Medicine

## 2019-03-14 VITALS — BP 138/98 | HR 108 | Ht 60.0 in | Wt 243.0 lb

## 2019-03-14 DIAGNOSIS — R0600 Dyspnea, unspecified: Secondary | ICD-10-CM | POA: Diagnosis not present

## 2019-03-14 DIAGNOSIS — R0609 Other forms of dyspnea: Secondary | ICD-10-CM

## 2019-03-14 DIAGNOSIS — G894 Chronic pain syndrome: Secondary | ICD-10-CM | POA: Diagnosis not present

## 2019-03-14 DIAGNOSIS — M999 Biomechanical lesion, unspecified: Secondary | ICD-10-CM | POA: Diagnosis not present

## 2019-03-14 NOTE — Progress Notes (Signed)
[place] Phone: [phone] Subjective:   [name], am serving as a scribe for Dr. Hulan Saas. This visit occurred during the SARS-CoV-2 public health emergency.  Safety protocols were in place, including screening questions prior to the visit, additional usage of staff PPE, and extensive cleaning of exam room while observing appropriate contact time as indicated for disinfecting solutions.   I'm seeing this patient by the request  of:  Street, [name], MD  CC: Multiple complaints  [id number]   02/27/2019 Carpal tunnel with the differentiation of the possibility of cervical radiculopathy.  May need to consider the possibility of nerve conduction study or possibly therapeutic and diagnostic injection of the carpal tunnel.  Patient will consider both of these.  Discussed Cymbalta that could help with some of the nerve pain as well.  Discussed with patient in great length.  Patient is on bone-on-bone on this knee and does have instability.  We discussed the possibility of replacement again.  Patient is concerned secondary to the allergy to different metals.  Encouraged her though to consider it because patient's quality of life continues to seem to decline.  Chronic problem with exacerbation  Update 03/14/2019 Kerry [name] is a 69 y.o. female coming in with complaint of polyarthralgia. Last visit discussed knee and wrist pain. Patient states that she has been having pain with left sided cervical rotation.   Also having soreness in glute and left shoulder blade. Does note improvement since she made appointment earlier this week. No longer feels patient does not know exactly what happened.  Seem to be after the last visit somewhat.  Feels like and possible exacerbation.  Slowly getting better though.    Past Medical History:  Diagnosis Date  . Abnormal uterine bleeding   . Allergic rhinitis, cause  unspecified   . Arthritis of knee, right   . Bursitis of left shoulder   . 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 Chronic pain   . Dyspareunia   . Elevated cholesterol   . Fibroid   . Foot fracture, left 01/2015   and torn tendons  . GERD (gastroesophageal reflux disease)   . HTN (hypertension)   . Kidney stones   . Sleep apnea   . Urinary incontinence    Past Surgical History:  Procedure Laterality Date  .  dilatation and curettage  2000  . CARPAL TUNNEL RELEASE  1998  . CATARACT EXTRACTION Bilateral   . dental implants  2010  . DENTAL SURGERY    . ESOPHAGEAL DILATION  2010  . HAMMER TOE SURGERY     rt  . PELVIC LAPAROSCOPY  1990  . ROTATOR CUFF REPAIR Right 09/2011   Dr. Tamera Punt  . ROTATOR CUFF REPAIR Left 11/11/13   Dr. Tamera Punt   . SEPTOPLASTY    . UMBILICAL HERNIA REPAIR  2010   Social History   Socioeconomic History  . Marital status: Married    Spouse name: Not on file  . Number of children: Not on file  . Years of education: Not on file  . Highest education level: Not on file  Occupational History  . Occupation: Retired  Tobacco Use  . Smoking status: Never Smoker  . Smokeless tobacco: Never Used  Substance and Sexual Activity  . Alcohol use: No    Alcohol/week: 0.0 standard drinks  . Drug use: No  . Sexual activity: Not Currently    Partners: Male  Birth control/protection: Post-menopausal  Other Topics Concern  . Not on file  Social History Narrative  . Not on file   Social Determinants of Health   Financial Resource Strain:   . Difficulty of Paying Living Expenses: Not on file  Food Insecurity:   . Worried About Charity fundraiser in the Last Year: Not on file  . Ran Out of Food in the Last Year: Not on file  Transportation Needs:   . Lack of Transportation (Medical): Not on file  . Lack of Transportation (Non-Medical): Not on file  Physical Activity:   . Days of Exercise per Week: Not on file  . Minutes of Exercise per Session: Not  on file  Stress:   . Feeling of Stress : Not on file  Social Connections:   . Frequency of Communication with Friends and Family: Not on file  . Frequency of Social Gatherings with Friends and Family: Not on file  . Attends Religious Services: Not on file  . Active Member of Clubs or Organizations: Not on file  . Attends Archivist Meetings: Not on file  . Marital Status: Not on file   Allergies  Allergen Reactions  . Molds & Smuts Other (See Comments), Palpitations, Shortness Of Breath and Swelling  . Cefixime Swelling  . Celebrex [Celecoxib] Swelling  . Cobalt Other (See Comments)    Per allergist  . Gabapentin Other (See Comments)    Joint pain  . Levofloxacin Other (See Comments)    Muscle and joint pain  . Molybdenum Other (See Comments)    Per allergist  . Other Other (See Comments)    TEQUIN. TEQUIN - JOINT AND MUSCLE PAIN Other Reaction: painful joints TEQUIN. TEQUIN - JOINT AND MUSCLE PAIN Tantal Metal  . Penicillins Hives  . Robaxin [Methocarbamol]   . Tomato Hives  . Adhesive [Tape] Rash    Sensitive to EKG leads; sensitive to 36M Micropore tape  . Dust Mite Extract Itching, Other (See Comments) and Palpitations  . Tetracycline Hcl Rash    Can take Doxy  . Tetracyclines & Related Rash   Family History  Problem Relation Age of Onset  . Rheumatologic disease Mother   . Diabetes Mother   . Heart attack Father   . Rheumatologic disease Sister   . Arthritis Sister        --Rheumatoid arthritis  . Ovarian cancer Sister 62  . Crohn's disease Brother   . Kidney failure Brother   . Ulcerative colitis Brother   . Glaucoma Brother         Current Outpatient Medications (Hematological):  .  cyanocobalamin (,VITAMIN B-12,) 1000 MCG/ML injection, INJECT 1 ML AS DIRECTED EVERY 14 DAYS  Current Outpatient Medications (Other):  Marland Kitchen  Cholecalciferol (VITAMIN D3) 5000 units CAPS, Take 5,000 Units by mouth. Every other day .  Misc Natural Products (TART  CHERRY ADVANCED PO), 800 mg.  .  Multiple Vitamin (MULTIVITAMIN) tablet, Take 1 tablet by mouth daily. Super Multi by [employer] .  Omega-3 Fatty Acids (FISH OIL) 1000 MG CAPS, Take by mouth. 1 per day .  Probiotic Product (PROBIOTIC ADVANCED PO), Take by mouth. Patient states taking 5 billion, one capsule daily. Suprema Dophilus probiotic .  SYRINGE-NEEDLE, DISP, 3 ML 25G X 1" 3 ML MISC, Inject B12 every 2 weeks. .  vitamin C (ASCORBIC ACID) 500 MG tablet, Take 500 mg by mouth daily. Not taken on the day that Phytomulti is taken   Reviewed prior external information  including notes and imaging from  primary care provider As well as notes that were available from care everywhere and other healthcare systems.  Past medical history, social, surgical and family history all reviewed in electronic medical record.  No pertanent information unless stated regarding to the chief complaint.   Review of Systems:  No headache, visual changes, nausea, vomiting, diarrhea, constipation, dizziness, abdominal pain, skin rash, fevers, chills, night sweats, weight loss, swollen lymph nodes,  joint swelling, chest pain, shortness of breath, mood changes. POSITIVE muscle aches, body aches  Objective  Blood pressure (!) 138/98, pulse (!) 108, height 5' (1.524 m), weight 243 lb (110.2 kg), last menstrual period 01/16/2002, SpO2 98 %.   General: No apparent distress alert and oriented x3 mood and affect normal, dressed appropriately.  HEENT: Pupils equal, extraocular movements intact  Respiratory: Patient's speak in full sentences and does not appear short of breath  Cardiovascular: Trace lower extremity edema, non tender, no erythema regular rate and rhythm with mild PACs the patient does have what appears to be an 2 out of 6 systolic murmur of the right sternal border Skin: Warm dry intact with no signs of infection or rash on extremities or on axial skeleton.  Abdomen: Soft nontender  Neuro: Cranial nerves  II through XII are intact, neurovascularly intact in all extremities with 2+ DTRs and 2+ pulses.  Lymph: No lymphadenopathy of posterior or anterior cervical chain or axillae bilaterally.  Gait severely antalgic Neck exam shows the patient does have some mild loss of lordosis, patient is tender to palpation of the left side of the neck more than the right.  Seems to go into the parascapular region as well. Patient has 4-5 strength of the upper extremities but symmetric mild positive Tinel's sign on the left side.  Overweight  Osteopathic findings  C2 flexed rotated and side bent left C6 flexed rotated and side bent left T3 extended rotated and side bent left inhaled third rib T9 extended rotated and side bent left L2 flexed rotated and side bent left Sacrum right on right     Impression and Recommendations:     This case required medical decision making of moderate complexity. The above documentation has been reviewed and is accurate and complete [name], DO       Note: This dictation was prepared with Dragon dictation along with smaller phrase technology. Any transcriptional errors that result from this process are unintentional.

## 2019-03-14 NOTE — Patient Instructions (Signed)
3 IBU when you get home Keep other appt if you want I will try to find another shoe for you

## 2019-03-16 ENCOUNTER — Encounter: Payer: Self-pay | Admitting: Family Medicine

## 2019-03-16 NOTE — Assessment & Plan Note (Signed)
Concern with patient's dyspnea on exertion.  Continue to be concerning for may be some mild unstable angina.  Held on any type of testing at the moment with patient having a follow-up with her cardiologist next week.  Discussed with patient in great length that further work-up may be necessary, any true chest pain or shortness of breath to seek medical attention immediately.  Patient does have a new murmur appreciated at the right sternal border that could signify aortic pathology.  Patient as well as husband understands and will be following up with cardiology in the near future.

## 2019-03-16 NOTE — Assessment & Plan Note (Signed)
Continue to have chronic pain.  Seems to be out of proportion to the amount of palpation again.  Some mild muscle spasms.  Has not done well though with medications.  Discussed anti-inflammatories 1 time that I think will be beneficial and attempted osteopathic manipulation again.  Follow-up again in 4 to 8 weeks

## 2019-03-16 NOTE — Assessment & Plan Note (Signed)
Decision today to treat with OMT was based on Physical Exam  After verbal consent patient was treated with HVLA, ME, FPR techniques in cervical, thoracic, rib,  lumbar and sacral areas  Patient tolerated the procedure well with improvement in symptoms  Patient given exercises, stretches and lifestyle modifications  See medications in patient instructions if given  Patient will follow up in 4-8 weeks 

## 2019-03-19 ENCOUNTER — Ambulatory Visit: Payer: Medicare Other | Attending: Internal Medicine

## 2019-03-19 DIAGNOSIS — Z23 Encounter for immunization: Secondary | ICD-10-CM

## 2019-03-19 NOTE — Progress Notes (Signed)
   Covid-19 Vaccination Clinic  Name:  Kerry [name]    MRN: [id number] DOB: Oct 31, 1950  03/19/2019  Kerry [name] was observed post Covid-19 immunization for 30 minutes without incident. She was provided with Vaccine Information Sheet and instruction to access the V-Safe system.   Kerry [name] was instructed to call 911 with any severe reactions post vaccine: Marland Kitchen Difficulty breathing  . Swelling of face and throat  . A fast heartbeat  . A bad rash all over body  . Dizziness and weakness   Immunizations Administered    Name Date Dose VIS Date Route   Pfizer COVID-19 Vaccine 03/19/2019 11:13 AM 0.3 mL 12/27/2018 Intramuscular   Manufacturer: [place]   Lot: [id number]   [employer]: [id number]

## 2019-04-02 DIAGNOSIS — K589 Irritable bowel syndrome without diarrhea: Secondary | ICD-10-CM | POA: Diagnosis not present

## 2019-04-02 DIAGNOSIS — H6091 Unspecified otitis externa, right ear: Secondary | ICD-10-CM | POA: Diagnosis not present

## 2019-04-02 DIAGNOSIS — I5189 Other ill-defined heart diseases: Secondary | ICD-10-CM | POA: Diagnosis not present

## 2019-04-02 DIAGNOSIS — E785 Hyperlipidemia, unspecified: Secondary | ICD-10-CM | POA: Diagnosis not present

## 2019-04-02 DIAGNOSIS — K8050 Calculus of bile duct without cholangitis or cholecystitis without obstruction: Secondary | ICD-10-CM | POA: Diagnosis not present

## 2019-04-02 DIAGNOSIS — R7309 Other abnormal glucose: Secondary | ICD-10-CM | POA: Diagnosis not present

## 2019-04-02 DIAGNOSIS — Z79899 Other long term (current) drug therapy: Secondary | ICD-10-CM | POA: Diagnosis not present

## 2019-04-10 ENCOUNTER — Ambulatory Visit (INDEPENDENT_AMBULATORY_CARE_PROVIDER_SITE_OTHER): Payer: Medicare Other

## 2019-04-10 ENCOUNTER — Other Ambulatory Visit: Payer: Self-pay

## 2019-04-10 ENCOUNTER — Encounter: Payer: Self-pay | Admitting: Family Medicine

## 2019-04-10 ENCOUNTER — Ambulatory Visit (INDEPENDENT_AMBULATORY_CARE_PROVIDER_SITE_OTHER): Payer: Medicare Other | Admitting: Family Medicine

## 2019-04-10 ENCOUNTER — Ambulatory Visit (INDEPENDENT_AMBULATORY_CARE_PROVIDER_SITE_OTHER): Payer: Medicare Other | Admitting: Obstetrics and Gynecology

## 2019-04-10 ENCOUNTER — Encounter: Payer: Self-pay | Admitting: Obstetrics and Gynecology

## 2019-04-10 VITALS — BP 170/100 | HR 110 | Temp 98.0°F | Ht 60.0 in | Wt 236.2 lb

## 2019-04-10 VITALS — BP 150/82 | HR 101 | Ht 60.0 in | Wt 234.0 lb

## 2019-04-10 DIAGNOSIS — M109 Gout, unspecified: Secondary | ICD-10-CM

## 2019-04-10 DIAGNOSIS — Z8041 Family history of malignant neoplasm of ovary: Secondary | ICD-10-CM | POA: Diagnosis not present

## 2019-04-10 DIAGNOSIS — E538 Deficiency of other specified B group vitamins: Secondary | ICD-10-CM

## 2019-04-10 DIAGNOSIS — M5030 Other cervical disc degeneration, unspecified cervical region: Secondary | ICD-10-CM | POA: Diagnosis not present

## 2019-04-10 DIAGNOSIS — D219 Benign neoplasm of connective and other soft tissue, unspecified: Secondary | ICD-10-CM

## 2019-04-10 DIAGNOSIS — E559 Vitamin D deficiency, unspecified: Secondary | ICD-10-CM | POA: Diagnosis not present

## 2019-04-10 DIAGNOSIS — M2550 Pain in unspecified joint: Secondary | ICD-10-CM | POA: Diagnosis not present

## 2019-04-10 DIAGNOSIS — M999 Biomechanical lesion, unspecified: Secondary | ICD-10-CM

## 2019-04-10 LAB — VITAMIN D 25 HYDROXY (VIT D DEFICIENCY, FRACTURES): VITD: 45.88 ng/mL (ref 30.00–100.00)

## 2019-04-10 LAB — URIC ACID: Uric Acid, Serum: 7.7 mg/dL — ABNORMAL HIGH (ref 2.4–7.0)

## 2019-04-10 LAB — VITAMIN B12: Vitamin B-12: 561 pg/mL (ref 211–911)

## 2019-04-10 LAB — SEDIMENTATION RATE: Sed Rate: 30 mm/hr (ref 0–30)

## 2019-04-10 LAB — C-REACTIVE PROTEIN: CRP: <1 mg/dL (ref 0.5–20.0)

## 2019-04-10 NOTE — Assessment & Plan Note (Signed)
   Decision today to treat with OMT was based on Physical Exam  After verbal consent patient was treated with  ME, FPR techniques in cervical, thoracic, rib, lumbar and sacral areas, all areas are chronic   Patient tolerated the procedure well with improvement in symptoms  Patient given exercises, stretches and lifestyle modifications  See medications in patient instructions if given  Patient will follow up in 4-8 weeks 

## 2019-04-10 NOTE — Progress Notes (Signed)
[place] Phone: [phone] Subjective:   [name], am serving as a scribe for Dr. Hulan Saas. This visit occurred during the SARS-CoV-2 public health emergency.  Safety protocols were in place, including screening questions prior to the visit, additional usage of staff PPE, and extensive cleaning of exam room while observing appropriate contact time as indicated for disinfecting solutions.   I'm seeing this patient by the request  of:  Street, [name], MD  CC: Neck pain follow-up  [id number]  Kerry [name] is a 69 y.o. female coming in with complaint of back pain. Last seen for OMT on 03/14/2019. Patient states that left sided cervical spine pain that occurs 2 days after OMT. Patient is also having right shoulder pain. Does have numbness and tingling in fingertips.  Patient has had chronic problem for quite some time.  Has been concern for more of a seronegative rheumatoid arthritis versus the possibility of other inflammatory diseases.  Patient has continued to have neck pain.  Does not feel that the manipulation helped significantly.       Past Medical History:  Diagnosis Date  . Abnormal uterine bleeding   . Allergic rhinitis, cause unspecified   . Arthritis of knee, right   . Bursitis of left shoulder   . 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 Chronic pain   . Dyspareunia   . Elevated cholesterol   . Fibroid   . Foot fracture, left 01/2015   and torn tendons  . GERD (gastroesophageal reflux disease)   . HTN (hypertension)   . Kidney stones   . Sleep apnea   . Urinary incontinence    Past Surgical History:  Procedure Laterality Date  .  dilatation and curettage  2000  . CARPAL TUNNEL RELEASE  1998  . CATARACT EXTRACTION Bilateral   . dental implants  2010  . DENTAL SURGERY    . ESOPHAGEAL DILATION  2010  . HAMMER TOE SURGERY     rt  . PELVIC LAPAROSCOPY  1990  . ROTATOR  CUFF REPAIR Right 09/2011   Dr. Tamera Punt  . ROTATOR CUFF REPAIR Left 11/11/13   Dr. Tamera Punt   . SEPTOPLASTY    . UMBILICAL HERNIA REPAIR  2010   Social History   Socioeconomic History  . Marital status: Married    Spouse name: Not on file  . Number of children: Not on file  . Years of education: Not on file  . Highest education level: Not on file  Occupational History  . Occupation: Retired  Tobacco Use  . Smoking status: Never Smoker  . Smokeless tobacco: Never Used  Substance and Sexual Activity  . Alcohol use: No    Alcohol/week: 0.0 standard drinks  . Drug use: No  . Sexual activity: Not Currently    Partners: Male    Birth control/protection: Post-menopausal  Other Topics Concern  . Not on file  Social History Narrative  . Not on file   Social Determinants of Health   Financial Resource Strain:   . Difficulty of Paying Living Expenses:   Food Insecurity:   . Worried About Charity fundraiser in the Last Year:   . Arboriculturist in the Last Year:   Transportation Needs:   . Film/video editor (Medical):   Marland Kitchen Lack of Transportation (Non-Medical):   Physical Activity:   . Days of Exercise per Week:   . Minutes of  Exercise per Session:   Stress:   . Feeling of Stress :   Social Connections:   . Frequency of Communication with Friends and Family:   . Frequency of Social Gatherings with Friends and Family:   . Attends Religious Services:   . Active Member of Clubs or Organizations:   . Attends Archivist Meetings:   Marland Kitchen Marital Status:    Allergies  Allergen Reactions  . Molds & Smuts Other (See Comments), Palpitations, Shortness Of Breath and Swelling  . Cefixime Swelling  . Celebrex [Celecoxib] Swelling  . Cobalt Other (See Comments)    Per allergist  . Gabapentin Other (See Comments)    Joint pain  . Levofloxacin Other (See Comments)    Muscle and joint pain  . Molybdenum Other (See Comments)    Per allergist  . Other Other (See  Comments)    TEQUIN. TEQUIN - JOINT AND MUSCLE PAIN Other Reaction: painful joints TEQUIN. TEQUIN - JOINT AND MUSCLE PAIN Tantal Metal  . Penicillins Hives  . Robaxin [Methocarbamol]   . Tomato Hives  . Adhesive [Tape] Rash    Sensitive to EKG leads; sensitive to 72M Micropore tape  . Dust Mite Extract Itching, Other (See Comments) and Palpitations  . Tetracycline Hcl Rash    Can take Doxy  . Tetracyclines & Related Rash   Family History  Problem Relation Age of Onset  . Rheumatologic disease Mother   . Diabetes Mother   . Heart attack Father   . Rheumatologic disease Sister   . Arthritis Sister        --Rheumatoid arthritis  . Ovarian cancer Sister 30  . Crohn's disease Brother   . Kidney failure Brother   . Ulcerative colitis Brother   . Glaucoma Brother         Current Outpatient Medications (Hematological):  .  cyanocobalamin (,VITAMIN B-12,) 1000 MCG/ML injection, INJECT 1 ML AS DIRECTED EVERY 14 DAYS  Current Outpatient Medications (Other):  Marland Kitchen  Cholecalciferol (VITAMIN D3) 5000 units CAPS, Take 5,000 Units by mouth. Every other day .  Misc Natural Products (TART CHERRY ADVANCED PO), 800 mg.  .  Multiple Vitamin (MULTIVITAMIN) tablet, Take 1 tablet by mouth daily. Super Multi by [employer] .  Omega-3 Fatty Acids (FISH OIL) 1000 MG CAPS, Take by mouth. 1 per day .  OVER THE COUNTER MEDICATION, Super Multi by Nita Sells 1/2 tablet daily .  Probiotic Product (PROBIOTIC ADVANCED PO), Take by mouth. Patient states taking 5 billion, one capsule daily. Suprema Dophilus probiotic .  SYRINGE-NEEDLE, DISP, 3 ML 25G X 1" 3 ML MISC, Inject B12 every 2 weeks. .  vitamin C (ASCORBIC ACID) 500 MG tablet, Take 500 mg by mouth daily. Not taken on the day that Phytomulti is taken   Reviewed prior external information including notes and imaging from  primary care provider As well as notes that were available from care everywhere and other healthcare systems.  Past medical  history, social, surgical and family history all reviewed in electronic medical record.  No pertanent information unless stated regarding to the chief complaint.   Review of Systems:  No headache, visual changes, nausea, vomiting, diarrhea, constipation, dizziness, abdominal pain, skin rash, fevers, chills, night sweats, weight loss, swollen lymph nodes, , joint swelling, chest pain, shortness of breath, mood changes. POSITIVE muscle aches, body aches  Objective  Blood pressure (!) 150/82, pulse (!) 101, height 5' (1.524 m), weight 234 lb (106.1 kg), last menstrual period 01/16/2002, SpO2 97 %.  General: No apparent distress alert and oriented x3 mood and affect normal, dressed appropriately.  Overweight Severely antalgic walking with the aid of a cane Patient's knee exam shows the patient does have instability still noted.  Limited range of motion of both knees. Patient's neck exam does have loss of lordosis.  Degenerative changes noted.  Tender to palpation of the paraspinal musculature lumbar spine right greater than left. With manipulation of the neck patient does have some mild radicular symptoms in the C6 and C7 distribution on the left side.  Patient does have full range of motion of the shoulder.  Osteopathic findings C2 flexed rotated and side bent right T3 extended rotated and side bent right inhaled third rib T9 extended rotated and side bent left L2 flexed rotated and side bent right Sacrum right on right    Impression and Recommendations:     This case required medical decision making of moderate complexity. The above documentation has been reviewed and is accurate and complete [name], DO       Note: This dictation was prepared with Dragon dictation along with smaller phrase technology. Any transcriptional errors that result from this process are unintentional.

## 2019-04-10 NOTE — Assessment & Plan Note (Signed)
Injection given today. 

## 2019-04-10 NOTE — Assessment & Plan Note (Signed)
Degenerative cervical disc.  Discussed icing regimen and home exercise, which activities to do which wants to avoid.  Patient does have some cervical radiculopathy.  Chronic pain with likely seronegative arthritis changes.  Has had eggs extensive laboratory testing done but has had elevated uric acid.  Repeat again to see if it is elevated again.  If so I would like patient to take allopurinol.  Responds fairly well to osteopathic manipulation.  Follow-up again 4 to 8 weeks vitamin B12 given today

## 2019-04-10 NOTE — Assessment & Plan Note (Signed)
Patient was doing somewhat better with this in 2016 but has become somewhat noncompliant.  Retest uric acid and if again high I would recommend significantly allopurinol.

## 2019-04-10 NOTE — Patient Instructions (Addendum)
If uric acid high again in recommend allopurinol Labs today See me again in 5 weeks

## 2019-04-10 NOTE — Progress Notes (Signed)
GYNECOLOGY  VISIT   HPI: 69 y.o.   Married  Caucasian  female   G2P2002 with Patient's last menstrual period was 01/16/2002.   here for pelvic ultrasound due to family history of ovarian cancer.   Sister had ovarian cancer age 55 yo.  Her sister did not have genetic testing.  Patient is having yearly pelvic ultrasounds to check her ovaries.  Her bimanual pelvic exam is limited due to her BMI.  Patient CA125 5.8 12/20/17.  GYNECOLOGIC HISTORY: Patient's last menstrual period was 01/16/2002. Contraception: PMP Menopausal hormone therapy:  none Last mammogram:  07/26/17 BIRADS 1 negative/density c--she knows to schedule Last pap smear:  08-05-18 Neg, 07/28/16 Negative        OB History    Gravida  2   Para  2   Term  2   Preterm      AB      Living  2     SAB      TAB      Ectopic      Multiple      Live Births                 Patient Active Problem List   Diagnosis Date Noted  . Carpal tunnel syndrome on left 02/27/2019  . Left lumbar radiculopathy 12/28/2017  . Nonallopathic lesion of thoracic region 08/02/2017  . Swelling of both lower extremities 01/18/2017  . Nonallopathic lesion of sacral region 01/03/2017  . Allergic contact dermatitis due to metals 12/16/2016  . Abnormal gait 04/10/2016  . Dyslipidemia 08/12/2015  . Dyspnea on exertion 08/12/2015  . Palpitations 08/12/2015  . Pes anserine bursitis 08/10/2015  . Rupture of left posterior tibialis tendon 03/25/2015  . OSA (obstructive sleep apnea) 03/05/2015  . Navicular fracture of ankle 01/21/2015  . Sinus infection 12/25/2014  . Uric acid arthropathy 03/23/2014  . High plasma oxalate 02/17/2014  . Nonallopathic lesion of cervical region 12/19/2012  . Fibroids 05/28/2012  . Family history of ovarian cancer 05/28/2012  . Strain of quadriceps muscle 04/17/2012  . Nonallopathic lesion of costovertebral region 01/23/2012  . Left shoulder pain 12/20/2011  . Rotator cuff disorder 09/26/2011  .  Bilateral foot pain 09/20/2011  . Arthralgia of multiple joints 07/26/2011  . Arthritis of knee, degenerative 07/26/2011  . Arthritis of hand, degenerative 07/26/2011  . Low back pain 07/26/2011  . Vitamin B 12 deficiency 03/05/2011  . TMJ (temporomandibular joint syndrome) 03/05/2011  . Pes planus 03/01/2011  . Right knee pain 03/01/2011  . Greater trochanteric bursitis of left hip 03/01/2011  . Nonallopathic lesion of lumbosacral region 01/27/2011  . Obesity 12/29/2010  . Fatigue 12/29/2010  . Allergic rhinitis, cause unspecified   . Chronic pain   . GERD (gastroesophageal reflux disease)   . Sleep apnea     Past Medical History:  Diagnosis Date  . Abnormal uterine bleeding   . Allergic rhinitis, cause unspecified   . Arthritis of knee, right   . Bursitis of left shoulder   . 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 Chronic pain   . Dyspareunia   . Elevated cholesterol   . Fibroid   . Foot fracture, left 01/2015   and torn tendons  . GERD (gastroesophageal reflux disease)   . HTN (hypertension)   . Kidney stones   . Sleep apnea   . Urinary incontinence     Past Surgical History:  Procedure Laterality Date  .  dilatation and curettage  2000  .  CARPAL TUNNEL RELEASE  1998  . CATARACT EXTRACTION Bilateral   . dental implants  2010  . DENTAL SURGERY    . ESOPHAGEAL DILATION  2010  . HAMMER TOE SURGERY     rt  . PELVIC LAPAROSCOPY  1990  . ROTATOR CUFF REPAIR Right 09/2011   Dr. Tamera Punt  . ROTATOR CUFF REPAIR Left 11/11/13   Dr. Tamera Punt   . SEPTOPLASTY    . UMBILICAL HERNIA REPAIR  2010    Current Outpatient Medications  Medication Sig Dispense Refill  . Cholecalciferol (VITAMIN D3) 5000 units CAPS Take 5,000 Units by mouth. Every other day    . cyanocobalamin (,VITAMIN B-12,) 1000 MCG/ML injection INJECT 1 ML AS DIRECTED EVERY 14 DAYS 6 mL 0  . Misc Natural Products (TART CHERRY ADVANCED PO) 800 mg.     . Multiple Vitamin (MULTIVITAMIN) tablet Take 1 tablet  by mouth daily. Super Multi by [employer]    . Omega-3 Fatty Acids (FISH OIL) 1000 MG CAPS Take by mouth. 1 per day    . OVER THE COUNTER MEDICATION Super Multi by Nita Sells 1/2 tablet daily    . Probiotic Product (PROBIOTIC ADVANCED PO) Take by mouth. Patient states taking 5 billion, one capsule daily. Suprema Dophilus probiotic    . SYRINGE-NEEDLE, DISP, 3 ML 25G X 1" 3 ML MISC Inject B12 every 2 weeks. 50 each 0  . vitamin C (ASCORBIC ACID) 500 MG tablet Take 500 mg by mouth daily. Not taken on the day that Phytomulti is taken     No current facility-administered medications for this visit.     ALLERGIES: Molds & smuts, Cefixime, Celebrex [celecoxib], Cobalt, Gabapentin, Levofloxacin, Molybdenum, Other, Penicillins, Robaxin [methocarbamol], Tomato, Adhesive [tape], Dust mite extract, Tetracycline hcl, and Tetracyclines & related  Family History  Problem Relation Age of Onset  . Rheumatologic disease Mother   . Diabetes Mother   . Heart attack Father   . Rheumatologic disease Sister   . Arthritis Sister        --Rheumatoid arthritis  . Ovarian cancer Sister 63  . Crohn's disease Brother   . Kidney failure Brother   . Ulcerative colitis Brother   . Glaucoma Brother     Social History   Socioeconomic History  . Marital status: Married    Spouse name: Not on file  . Number of children: Not on file  . Years of education: Not on file  . Highest education level: Not on file  Occupational History  . Occupation: Retired  Tobacco Use  . Smoking status: Never Smoker  . Smokeless tobacco: Never Used  Substance and Sexual Activity  . Alcohol use: No    Alcohol/week: 0.0 standard drinks  . Drug use: No  . Sexual activity: Not Currently    Partners: Male    Birth control/protection: Post-menopausal  Other Topics Concern  . Not on file  Social History Narrative  . Not on file   Social Determinants of Health   Financial Resource Strain:   . Difficulty of Paying  Living Expenses:   Food Insecurity:   . Worried About Charity fundraiser in the Last Year:   . Arboriculturist in the Last Year:   Transportation Needs:   . Film/video editor (Medical):   Marland Kitchen Lack of Transportation (Non-Medical):   Physical Activity:   . Days of Exercise per Week:   . Minutes of Exercise per Session:   Stress:   . Feeling of Stress :  Social Connections:   . Frequency of Communication with Friends and Family:   . Frequency of Social Gatherings with Friends and Family:   . Attends Religious Services:   . Active Member of Clubs or Organizations:   . Attends Archivist Meetings:   Marland Kitchen Marital Status:   Intimate Partner Violence:   . Fear of Current or Ex-Partner:   . Emotionally Abused:   Marland Kitchen Physically Abused:   . Sexually Abused:     Review of Systems  All other systems reviewed and are negative.   PHYSICAL EXAMINATION:    BP (!) 170/100 (Cuff Size: Large)   Pulse (!) 110   Temp 98 F (36.7 C) (Temporal)   Ht 5' (1.524 m)   Wt 236 lb 3.2 oz (107.1 kg)   LMP 01/16/2002   BMI 46.13 kg/m     General appearance: alert, cooperative and appears stated age  Pelvic US Uterus with multiple fibroids, including a cervical fibroid, 23 mm.  Fibroids are stable.  EMS 3.36 mm. Ovaries normal. No free fluid.   ASSESSMENT  Uterine and cervical fibroid.  Stable. FH ovarian cancer.  Normal ovaries on [place].   PLAN  Reassurance regarding [place] appearance of reproductive organs.  Check CA125.  Fu for annual exam and prn.    An After Visit Summary was printed and given to the patient.

## 2019-04-11 LAB — CA 125: Cancer Antigen (CA) 125: 9.4 U/mL (ref 0.0–38.1)

## 2019-04-15 ENCOUNTER — Other Ambulatory Visit: Payer: Self-pay

## 2019-04-15 ENCOUNTER — Encounter: Payer: Self-pay | Admitting: Cardiology

## 2019-04-15 ENCOUNTER — Ambulatory Visit (INDEPENDENT_AMBULATORY_CARE_PROVIDER_SITE_OTHER): Payer: Medicare Other | Admitting: Cardiology

## 2019-04-15 VITALS — BP 180/90 | HR 96 | Ht 60.0 in | Wt 234.0 lb

## 2019-04-15 DIAGNOSIS — R0609 Other forms of dyspnea: Secondary | ICD-10-CM

## 2019-04-15 DIAGNOSIS — E785 Hyperlipidemia, unspecified: Secondary | ICD-10-CM

## 2019-04-15 DIAGNOSIS — R0600 Dyspnea, unspecified: Secondary | ICD-10-CM | POA: Diagnosis not present

## 2019-04-15 DIAGNOSIS — R011 Cardiac murmur, unspecified: Secondary | ICD-10-CM | POA: Diagnosis not present

## 2019-04-15 NOTE — Progress Notes (Signed)
Cardiology Consultation:    Date:  04/15/2019   ID:  Kerry [name], DOB 1950-12-16, MRN [id number]  PCP:  Venetia Maxon [name], MD  Cardiologist:  Jenne Campus, MD   Referring MD: Street, [name], *   Chief Complaint  Patient presents with  . Follow-up  Vin-Parikh shortness of breath swollen legs  History of Present Illness:    Kerry [name] is a 69 y.o. female who is being seen today for the evaluation of shortness of breath at the request of Street, [name], *.  I have the pleasure to take care of her more than 3 years ago.  She is a very complicated lady she did receive a dental implant and then apparently she find out that she is allergic to the metal tooth working dental implant.  The side effects and complications that she got out of those implant include multiple pains in multiple areas of her body.  Eventually she managed to have her dental implant removed however pain since still there.  I did see her before because of shortness of breath as well as dyslipidemia essential hypertension.  She does not believe in [place] medicine and it somewhat difficult to put her on appropriate medications.  She is back in my office after not being seen for about 3 years.  She described to have fatigue tiredness and shortness of breath with exercise.  She complained of having pain in her joints especially knees.  She is to do water aerobics but does not do it anymore.  Describing of swelling of lower extremity still there all the time however worse at night.  She does not smoke she does not exercise right now, her bowel ability to exercise is limited because of pain in her knees.  She does have sleep apnea use CPAP mask on a regular basis.  Past Medical History:  Diagnosis Date  . Abnormal uterine bleeding   . Allergic rhinitis, cause unspecified   . Arthritis of knee, right   . Bursitis of left shoulder   . 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 Chronic pain   . Dyspareunia    . Elevated cholesterol   . Fibroid   . Foot fracture, left 01/2015   and torn tendons  . GERD (gastroesophageal reflux disease)   . HTN (hypertension)   . Kidney stones   . Sleep apnea   . Urinary incontinence     Past Surgical History:  Procedure Laterality Date  .  dilatation and curettage  2000  . CARPAL TUNNEL RELEASE  1998  . CATARACT EXTRACTION Bilateral   . dental implants  2010  . DENTAL SURGERY    . ESOPHAGEAL DILATION  2010  . HAMMER TOE SURGERY     rt  . PELVIC LAPAROSCOPY  1990  . ROTATOR CUFF REPAIR Right 09/2011   Dr. Tamera Punt  . ROTATOR CUFF REPAIR Left 11/11/13   Dr. Tamera Punt   . SEPTOPLASTY    . UMBILICAL HERNIA REPAIR  2010    Current Medications: Current Meds  Medication Sig  . Cholecalciferol (VITAMIN D3) 5000 units CAPS Take 5,000 Units by mouth. Every other day  . cyanocobalamin (,VITAMIN B-12,) 1000 MCG/ML injection INJECT 1 ML AS DIRECTED EVERY 14 DAYS  . Misc Natural Products (TART CHERRY ADVANCED PO) 800 mg.   . Multiple Vitamin (MULTIVITAMIN) tablet Take 1 tablet by mouth daily. Super Multi by [employer]  . Omega-3 Fatty Acids (FISH OIL) 1000 MG CAPS Take by mouth.  1 per day  . OVER THE COUNTER MEDICATION Super Multi by Nita Sells 1/2 tablet daily  . Probiotic Product (PROBIOTIC ADVANCED PO) Take by mouth. Patient states taking 5 billion, one capsule daily. Suprema Dophilus probiotic  . SYRINGE-NEEDLE, DISP, 3 ML 25G X 1" 3 ML MISC Inject B12 every 2 weeks.  . vitamin C (ASCORBIC ACID) 500 MG tablet Take 500 mg by mouth daily. Not taken on the day that Phytomulti is taken     Allergies:   Molds & smuts, Cefixime, Celebrex [celecoxib], Cobalt, Gabapentin, Levofloxacin, Molybdenum, Other, Penicillins, Robaxin [methocarbamol], Tomato, Adhesive [tape], Dust mite extract, Tetracycline hcl, and Tetracyclines & related   Social History   Socioeconomic History  . Marital status: Married    Spouse name: Not on file  . Number of children:  Not on file  . Years of education: Not on file  . Highest education level: Not on file  Occupational History  . Occupation: Retired  Tobacco Use  . Smoking status: Never Smoker  . Smokeless tobacco: Never Used  Substance and Sexual Activity  . Alcohol use: No    Alcohol/week: 0.0 standard drinks  . Drug use: No  . Sexual activity: Not Currently    Partners: Male    Birth control/protection: Post-menopausal  Other Topics Concern  . Not on file  Social History Narrative  . Not on file   Social Determinants of Health   Financial Resource Strain:   . Difficulty of Paying Living Expenses:   Food Insecurity:   . Worried About Charity fundraiser in the Last Year:   . Arboriculturist in the Last Year:   Transportation Needs:   . Film/video editor (Medical):   Marland Kitchen Lack of Transportation (Non-Medical):   Physical Activity:   . Days of Exercise per Week:   . Minutes of Exercise per Session:   Stress:   . Feeling of Stress :   Social Connections:   . Frequency of Communication with Friends and Family:   . Frequency of Social Gatherings with Friends and Family:   . Attends Religious Services:   . Active Member of Clubs or Organizations:   . Attends Archivist Meetings:   Marland Kitchen Marital Status:      Family History: The patient's family history includes Arthritis in her sister; Crohn's disease in her brother; Diabetes in her mother; Glaucoma in her brother; Heart attack in her father; Kidney failure in her brother; Ovarian cancer (age of onset: 18) in her sister; Rheumatologic disease in her mother and sister; Ulcerative colitis in her brother. ROS:   Please see the history of present illness.    All 14 point review of systems negative except as described per history of present illness.  EKGs/Labs/Other Studies Reviewed:    The following studies were reviewed today: I did review laboratory test that she brought from her primary care physician.  LDL 157, HDL 41.   Triglycerides 160 H&H 14.5: 42%, GFR was normal with creatinine 49  EKG:  EKG is  ordered today.  The ekg ordered today demonstrates normal sinus rhythm, normal P interval, left axis, cardiac for LVH, nonspecific ST segment changes  Recent Labs: No results found for requested labs within last 8760 hours.  Recent Lipid Panel    Component Value Date/Time   CHOL 257 (H) 02/09/2011 1027   TRIG 136 02/09/2011 1027   HDL 46 02/09/2011 1027   CHOLHDL 5.6 02/09/2011 1027   VLDL 27 02/09/2011 1027  LDLCALC 184 (H) 02/09/2011 1027    Physical Exam:    VS:  BP (!) 180/90   Pulse 96   Ht 5' (1.524 m)   Wt 234 lb (106.1 kg)   LMP 01/16/2002   SpO2 97%   BMI 45.70 kg/m     Wt Readings from Last 3 Encounters:  04/15/19 234 lb (106.1 kg)  04/10/19 234 lb (106.1 kg)  04/10/19 236 lb 3.2 oz (107.1 kg)     GEN:  Well nourished, well developed in no acute distress HEENT: Normal NECK: No JVD; No carotid bruits LYMPHATICS: No lymphadenopathy CARDIAC: RRR, no murmurs, no rubs, no gallops RESPIRATORY:  Clear to auscultation without rales, wheezing or rhonchi  ABDOMEN: Soft, non-tender, non-distended MUSCULOSKELETAL:  No edema; No deformity  SKIN: Warm and dry NEUROLOGIC:  Alert and oriented x 3 PSYCHIATRIC:  Normal affect   ASSESSMENT:    1. Dyspnea on exertion   2. Dyslipidemia    PLAN:    In order of problems listed above:  1. Essential hypertension.  Her blood pressure is elevated today but she thinks she is excited to see me.  She said when she seen her primary care physician her blood pressure was high initially but then later when it was rechecked blood pressure was much better.  She does have a blood pressure monitor and check her blood pressure sometimes however I asked her to check it on the regular basis and bring it to me next time when she is results.  I will asked her also to have an echocardiogram to assess left ventricle ejection fraction, also look for left ventricular  hypertrophy. 2. Dyslipidemia her LDL is clearly elevated 157 ideally need to be taking statin, she is reluctant, will continue this discussion.  On top of that she is planning to join the gym and do water aerobics which should help. 3. Swelling of lower extremities we will schedule her to have an echocardiogram to assess left ventricle ejection fraction.  She does have a prescription for 20 mg of furosemide which auscultated her legs are worse.  Overall it is quite complicated situation.  I will do echocardiogram to assess ejection fraction.  I will try to help with the blood pressure as well as with dyslipidemia.  However, I expect to have some difficulty putting her on right medications.   Medication Adjustments/Labs and Tests Ordered: Current medicines are reviewed at length with the patient today.  Concerns regarding medicines are outlined above.  No orders of the defined types were placed in this encounter.  No orders of the defined types were placed in this encounter.   Signed, Park Liter, MD, [facility]. 04/15/2019 1:51 PM    [facility]

## 2019-04-15 NOTE — Patient Instructions (Signed)
Medication Instructions:  No medication chsnges *If you need a refill on your cardiac medications before your next appointment, please call your pharmacy*   Lab Work: No labs ordered If you have labs (blood work) drawn today and your tests are completely normal, you will receive your results only by: Marland Kitchen MyChart Message (if you have MyChart) OR . A paper copy in the mail If you have any lab test that is abnormal or we need to change your treatment, we will call you to review the results.   Testing/Procedures: Your physician has requested that you have an echocardiogram. Echocardiography is a painless test that uses sound waves to create images of your heart. It provides your doctor with information about the size and shape of your heart and how well your heart's chambers and valves are working. This procedure takes approximately one hour. There are no restrictions for this procedure.     Follow-Up: At [facility], you and your health needs are our priority.  As part of our continuing mission to provide you with exceptional heart care, we have created designated Provider Care Teams.  These Care Teams include your primary Cardiologist (physician) and Advanced Practice Providers (APPs -  Physician Assistants and Nurse Practitioners) who all work together to provide you with the care you need, when you need it.  We recommend signing up for the patient portal called "MyChart".  Sign up information is provided on this After Visit Summary.  MyChart is used to connect with patients for Virtual Visits (Telemedicine).  Patients are able to view lab/test results, encounter notes, upcoming appointments, etc.  Non-urgent messages can be sent to your provider as well.   To learn more about what you can do with MyChart, go to [email or web address].    Your next appointment:   6 week(s)  The format for your next appointment:   In Person  Provider:   Jenne Campus, MD   Other  Instructions  Echocardiogram An echocardiogram is a procedure that uses painless sound waves (ultrasound) to produce an image of the heart. Images from an echocardiogram can provide important information about:  Signs of coronary artery disease (CAD).  Aneurysm detection. An aneurysm is a weak or damaged part of an artery wall that bulges out from the normal force of blood pumping through the body.  Heart size and shape. Changes in the size or shape of the heart can be associated with certain conditions, including heart failure, aneurysm, and CAD.  Heart muscle function.  Heart valve function.  Signs of a past heart attack.  Fluid buildup around the heart.  Thickening of the heart muscle.  A tumor or infectious growth around the heart valves. Tell a health care provider about:  Any allergies you have.  All medicines you are taking, including vitamins, herbs, eye drops, creams, and over-the-counter medicines.  Any blood disorders you have.  Any surgeries you have had.  Any medical conditions you have.  Whether you are pregnant or may be pregnant. What are the risks? Generally, this is a safe procedure. However, problems may occur, including:  Allergic reaction to dye (contrast) that may be used during the procedure. What happens before the procedure? No specific preparation is needed. You may eat and drink normally. What happens during the procedure?   An IV tube may be inserted into one of your veins.  You may receive contrast through this tube. A contrast is an injection that improves the quality of the pictures from your heart.  A gel will be applied to your chest.  A wand-like tool (transducer) will be moved over your chest. The gel will help to transmit the sound waves from the transducer.  The sound waves will harmlessly bounce off of your heart to allow the heart images to be captured in real-time motion. The images will be recorded on a computer. The  procedure may vary among health care providers and hospitals. What happens after the procedure?  You may return to your normal, everyday life, including diet, activities, and medicines, unless your health care provider tells you not to do that. Summary  An echocardiogram is a procedure that uses painless sound waves (ultrasound) to produce an image of the heart.  Images from an echocardiogram can provide important information about the size and shape of your heart, heart muscle function, heart valve function, and fluid buildup around your heart.  You do not need to do anything to prepare before this procedure. You may eat and drink normally.  After the echocardiogram is completed, you may return to your normal, everyday life, unless your health care provider tells you not to do that. This information is not intended to replace advice given to you by your health care provider. Make sure you discuss any questions you have with your health care provider. Document Revised: 04/25/2018 Document Reviewed: 02/05/2016 Elsevier Patient Education  [place].

## 2019-04-25 ENCOUNTER — Ambulatory Visit (INDEPENDENT_AMBULATORY_CARE_PROVIDER_SITE_OTHER): Payer: Medicare Other

## 2019-04-25 ENCOUNTER — Other Ambulatory Visit: Payer: Self-pay

## 2019-04-25 DIAGNOSIS — R011 Cardiac murmur, unspecified: Secondary | ICD-10-CM | POA: Diagnosis not present

## 2019-04-25 DIAGNOSIS — R0600 Dyspnea, unspecified: Secondary | ICD-10-CM

## 2019-04-25 NOTE — Progress Notes (Signed)
Complete echocardiogram performed.  Jimmy Parthenia Tellefsen RDCS, RVT  

## 2019-05-01 ENCOUNTER — Telehealth: Payer: Self-pay | Admitting: Cardiology

## 2019-05-01 NOTE — Telephone Encounter (Signed)
Spoke with patient just now and let her know that she will be fine to come in for her appointment on 05/22/19 since her and her husband have both been fully vaccinated against COVID. She also states that her current BP is 140/76. She just wanted to make note of this since her last BP listed was higher than she believes is correct.    Encouraged patient to call back with any questions or concerns.

## 2019-05-01 NOTE — Telephone Encounter (Signed)
Patient calling stating her husband will be traveling internationally and gets back 05/17/2019. Her appointment is 05/22/2019 and she would like to know if she needs to reschedule. She states to call her back at [phone], but not to save the number, because she does not have it on her regularly.

## 2019-05-02 ENCOUNTER — Telehealth: Payer: Self-pay

## 2019-05-02 NOTE — Telephone Encounter (Signed)
-----   Message from Park Liter, MD sent at 05/01/2019 12:43 PM EDT ----- Normal left ventricle ejection fraction, diastolic dysfunction, otherwise things are looking good

## 2019-05-02 NOTE — Telephone Encounter (Signed)
Left message on patients voicemail to please return our call.   

## 2019-05-05 NOTE — Telephone Encounter (Signed)
Spoke with patient regarding results.  Patient verbalizes understanding and is agreeable to plan of care. Advised patient to call back with any issues or concerns.  

## 2019-05-05 NOTE — Telephone Encounter (Signed)
Left message on patients voicemail to please return our call.   

## 2019-05-08 DIAGNOSIS — H35363 Drusen (degenerative) of macula, bilateral: Secondary | ICD-10-CM | POA: Diagnosis not present

## 2019-05-08 DIAGNOSIS — H35371 Puckering of macula, right eye: Secondary | ICD-10-CM | POA: Diagnosis not present

## 2019-05-08 DIAGNOSIS — H35372 Puckering of macula, left eye: Secondary | ICD-10-CM | POA: Diagnosis not present

## 2019-05-08 DIAGNOSIS — H5315 Visual distortions of shape and size: Secondary | ICD-10-CM | POA: Diagnosis not present

## 2019-05-15 ENCOUNTER — Ambulatory Visit: Payer: Medicare Other | Admitting: Family Medicine

## 2019-05-22 ENCOUNTER — Encounter: Payer: Self-pay | Admitting: Cardiology

## 2019-05-22 ENCOUNTER — Other Ambulatory Visit: Payer: Self-pay

## 2019-05-22 ENCOUNTER — Ambulatory Visit (INDEPENDENT_AMBULATORY_CARE_PROVIDER_SITE_OTHER): Payer: Medicare Other | Admitting: Cardiology

## 2019-05-22 VITALS — BP 180/96 | HR 98 | Ht 60.0 in | Wt 232.0 lb

## 2019-05-22 DIAGNOSIS — I10 Essential (primary) hypertension: Secondary | ICD-10-CM

## 2019-05-22 DIAGNOSIS — I5032 Chronic diastolic (congestive) heart failure: Secondary | ICD-10-CM | POA: Diagnosis not present

## 2019-05-22 DIAGNOSIS — R002 Palpitations: Secondary | ICD-10-CM

## 2019-05-22 DIAGNOSIS — E785 Hyperlipidemia, unspecified: Secondary | ICD-10-CM | POA: Diagnosis not present

## 2019-05-22 DIAGNOSIS — Z0000 Encounter for general adult medical examination without abnormal findings: Secondary | ICD-10-CM | POA: Diagnosis not present

## 2019-05-22 DIAGNOSIS — H8113 Benign paroxysmal vertigo, bilateral: Secondary | ICD-10-CM | POA: Diagnosis not present

## 2019-05-22 DIAGNOSIS — E790 Hyperuricemia without signs of inflammatory arthritis and tophaceous disease: Secondary | ICD-10-CM | POA: Diagnosis not present

## 2019-05-22 DIAGNOSIS — I110 Hypertensive heart disease with heart failure: Secondary | ICD-10-CM | POA: Diagnosis not present

## 2019-05-22 NOTE — Patient Instructions (Signed)

## 2019-05-22 NOTE — Progress Notes (Signed)
Cardiology Office Note:    Date:  05/22/2019   ID:  Kerry [name], DOB 09-06-1950, MRN [id number]  PCP:  Street, [name], MD  Cardiologist:  Jenne Campus, MD    Referring MD: [address] [name], *   No chief complaint on file. I am Still having some swelling in his lower extremities and feet  History of Present Illness:    Kerry [name] is a 69 y.o. female past medical history significant for hypertension, I did see her years ago and she presented back last time to me because of shortness of breath as well as swelling of lower extremities.  Echocardiogram has been done.  Showed preserved left ventricle ejection fraction she does have diastolic dysfunction.  She also has a history of hypertension as well as dyslipidemia.  Like always she is very particular about her care and she does have multiple questions.  Overall she seems to be doing better.  Swelling of lower extremities happened the evening time.  No shortness of breath no other issues.  Past Medical History:  Diagnosis Date  . Abnormal uterine bleeding   . Allergic rhinitis, cause unspecified   . Arthritis of knee, right   . Bursitis of left shoulder   . 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 Chronic pain   . Dyspareunia   . Elevated cholesterol   . Fibroid   . Foot fracture, left 01/2015   and torn tendons  . GERD (gastroesophageal reflux disease)   . HTN (hypertension)   . Kidney stones   . Sleep apnea   . Urinary incontinence     Past Surgical History:  Procedure Laterality Date  .  dilatation and curettage  2000  . CARPAL TUNNEL RELEASE  1998  . CATARACT EXTRACTION Bilateral   . dental implants  2010  . DENTAL SURGERY    . ESOPHAGEAL DILATION  2010  . HAMMER TOE SURGERY     rt  . PELVIC LAPAROSCOPY  1990  . ROTATOR CUFF REPAIR Right 09/2011   Dr. Tamera Punt  . ROTATOR CUFF REPAIR Left 11/11/13   Dr. Tamera Punt   . SEPTOPLASTY    . UMBILICAL HERNIA REPAIR  2010    Current  Medications: Current Meds  Medication Sig  . ascorbic acid (VITAMIN C) 500 MG tablet Take 500 mg by mouth daily.  . Cholecalciferol (VITAMIN D3) 5000 units CAPS Take 5,000 Units by mouth. Every other day  . cyanocobalamin (,VITAMIN B-12,) 1000 MCG/ML injection Inject 1,000 mcg into the muscle as directed.  . Misc Natural Products (TART CHERRY ADVANCED PO) Take 800 mg by mouth daily.  . Multiple Vitamins-Minerals (SUPER MULTI-VITAMIN PO) Take 1 tablet by mouth daily.  . Omega-3 Fatty Acids (FISH OIL) 1000 MG CAPS Take by mouth. 1 per day  . Probiotic Product (PROBIOTIC ADVANCED PO) Take by mouth. Patient states taking 5 billion, one capsule daily. Suprema Dophilus probiotic  . SYRINGE-NEEDLE, DISP, 3 ML 25G X 1" 3 ML MISC Inject B12 every 2 weeks.     Allergies:   Molds & smuts, Cefixime, Celebrex [celecoxib], Cobalt, Gabapentin, Levofloxacin, Molybdenum, Other, Penicillins, Robaxin [methocarbamol], Tomato, Adhesive [tape], Dust mite extract, Tetracycline hcl, and Tetracyclines & related   Social History   Socioeconomic History  . Marital status: Married    Spouse name: Not on file  . Number of children: Not on file  . Years of education: Not on file  . Highest education level: Not on file  Occupational History  .  Occupation: Retired  Tobacco Use  . Smoking status: Never Smoker  . Smokeless tobacco: Never Used  Substance and Sexual Activity  . Alcohol use: No    Alcohol/week: 0.0 standard drinks  . Drug use: No  . Sexual activity: Not Currently    Partners: Male    Birth control/protection: Post-menopausal  Other Topics Concern  . Not on file  Social History Narrative  . Not on file   Social Determinants of Health   Financial Resource Strain:   . Difficulty of Paying Living Expenses:   Food Insecurity:   . Worried About Charity fundraiser in the Last Year:   . Arboriculturist in the Last Year:   Transportation Needs:   . Film/video editor (Medical):   Marland Kitchen Lack of  Transportation (Non-Medical):   Physical Activity:   . Days of Exercise per Week:   . Minutes of Exercise per Session:   Stress:   . Feeling of Stress :   Social Connections:   . Frequency of Communication with Friends and Family:   . Frequency of Social Gatherings with Friends and Family:   . Attends Religious Services:   . Active Member of Clubs or Organizations:   . Attends Archivist Meetings:   Marland Kitchen Marital Status:      Family History: The patient's family history includes Arthritis in her sister; Crohn's disease in her brother; Diabetes in her mother; Glaucoma in her brother; Heart attack in her father; Kidney failure in her brother; Ovarian cancer (age of onset: 84) in her sister; Rheumatologic disease in her mother and sister; Ulcerative colitis in her brother. ROS:   Please see the history of present illness.    All 14 point review of systems negative except as described per history of present illness  EKGs/Labs/Other Studies Reviewed:      Recent Labs: No results found for requested labs within last 8760 hours.  Recent Lipid Panel    Component Value Date/Time   CHOL 257 (H) 02/09/2011 1027   TRIG 136 02/09/2011 1027   HDL 46 02/09/2011 1027   CHOLHDL 5.6 02/09/2011 1027   VLDL 27 02/09/2011 1027   LDLCALC 184 (H) 02/09/2011 1027    Physical Exam:    VS:  BP (!) 180/96   Pulse 98   Ht 5' (1.524 m)   Wt 232 lb (105.2 kg)   LMP 01/16/2002   SpO2 97%   BMI 45.31 kg/m     Wt Readings from Last 3 Encounters:  05/22/19 232 lb (105.2 kg)  04/15/19 234 lb (106.1 kg)  04/10/19 234 lb (106.1 kg)     GEN:  Well nourished, well developed in no acute distress HEENT: Normal NECK: No JVD; No carotid bruits LYMPHATICS: No lymphadenopathy CARDIAC: RRR, no murmurs, no rubs, no gallops RESPIRATORY:  Clear to auscultation without rales, wheezing or rhonchi  ABDOMEN: Soft, non-tender, non-distended MUSCULOSKELETAL:  No edema; No deformity  SKIN: Warm and  dry LOWER EXTREMITIES: no swelling NEUROLOGIC:  Alert and oriented x 3 PSYCHIATRIC:  Normal affect   ASSESSMENT:    1. Essential hypertension   2. Palpitations   3. Dyslipidemia    PLAN:    In order of problems listed above:  1. Essential hypertension blood pressure is elevated today she that she had a difficult night she could not sleep well.  She presented me with some research done by chiropractors showing that her manipulation at this time can significantly decrease her blood pressure  and she is willing to do that.  She does not want to take medications though. 2. Palpitations denies having any. 3. Dyslipidemia: I calculated her 10-year risk which is 25.5%.  I told her she is to be on high intensity statin.  She does not help I have she said she will try to do good diet alone.  I told that this degree of hyperlipidemia presents requested today.  Overall complicated   Medication Adjustments/Labs and Tests Ordered: Current medicines are reviewed at length with the patient today.  Concerns regarding medicines are outlined above.  No orders of the defined types were placed in this encounter.  Medication changes: No orders of the defined types were placed in this encounter.   Signed, Park Liter, MD, [facility] 05/22/2019 2:26 PM    [place]

## 2019-05-23 ENCOUNTER — Ambulatory Visit: Payer: Medicare Other | Admitting: Cardiology

## 2019-06-12 ENCOUNTER — Encounter: Payer: Self-pay | Admitting: Family Medicine

## 2019-06-13 ENCOUNTER — Other Ambulatory Visit: Payer: Self-pay

## 2019-06-13 ENCOUNTER — Ambulatory Visit (INDEPENDENT_AMBULATORY_CARE_PROVIDER_SITE_OTHER): Payer: Medicare Other | Admitting: Family Medicine

## 2019-06-13 VITALS — BP 158/98 | HR 100 | Ht 60.0 in | Wt 232.0 lb

## 2019-06-13 DIAGNOSIS — M79671 Pain in right foot: Secondary | ICD-10-CM | POA: Diagnosis not present

## 2019-06-13 DIAGNOSIS — M170 Bilateral primary osteoarthritis of knee: Secondary | ICD-10-CM

## 2019-06-13 DIAGNOSIS — M999 Biomechanical lesion, unspecified: Secondary | ICD-10-CM | POA: Diagnosis not present

## 2019-06-13 DIAGNOSIS — M109 Gout, unspecified: Secondary | ICD-10-CM | POA: Diagnosis not present

## 2019-06-13 DIAGNOSIS — M79672 Pain in left foot: Secondary | ICD-10-CM | POA: Diagnosis not present

## 2019-06-13 NOTE — Patient Instructions (Addendum)
Brace or 1/8 in heel lift Try tennis balls again See me again in 5 weeks

## 2019-06-13 NOTE — Progress Notes (Signed)
[place] Phone: [phone] Subjective:   [name], am serving as a scribe for Dr. Hulan Saas. This visit occurred during the SARS-CoV-2 public health emergency.  Safety protocols were in place, including screening questions prior to the visit, additional usage of staff PPE, and extensive cleaning of exam room while observing appropriate contact time as indicated for disinfecting solutions.   I'm seeing this patient by the request  of:  Street, [name], MD  CC: Multiple joint complaints  [id number]  Kerry [name] is a 69 y.o. female coming in with complaint of back pain. Last seen on 04/10/2019 for OMT. Patient states that her back is doing better. Pain increased one week ago. Felt rib pop and foam rolled manually and got it to pop back in place.   Left foot pain for past 4 years. Tried a different pair of shoes a couple months ago. Pain over medial malleolus. History of navicular stress fracture as well as posterior tibialis tendinitis. Patient did respond well to conservative therapy previously. Has been doing some mild increase in yard work.  Also having pain in right knee pain. Hamstring cramps with extension during her HEP. Patient has been trying to increase her range of motion of the knee and feels like this could be contributing. Has not noticed any more swelling.  Has started taking allopurinol for one week. Noticed some more swelling in the hands. Has not noticed improvement as much in the pains in the extremities but is noticing may be some pains of the back and the hips      Past Medical History:  Diagnosis Date  . Abnormal uterine bleeding   . Allergic rhinitis, cause unspecified   . Arthritis of knee, right   . Bursitis of left shoulder   . 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 Chronic pain   . Dyspareunia   . Elevated cholesterol   . Fibroid   . Foot fracture, left 01/2015    and torn tendons  . GERD (gastroesophageal reflux disease)   . HTN (hypertension)   . Kidney stones   . Sleep apnea   . Urinary incontinence    Past Surgical History:  Procedure Laterality Date  .  dilatation and curettage  2000  . CARPAL TUNNEL RELEASE  1998  . CATARACT EXTRACTION Bilateral   . dental implants  2010  . DENTAL SURGERY    . ESOPHAGEAL DILATION  2010  . HAMMER TOE SURGERY     rt  . PELVIC LAPAROSCOPY  1990  . ROTATOR CUFF REPAIR Right 09/2011   Dr. Tamera Punt  . ROTATOR CUFF REPAIR Left 11/11/13   Dr. Tamera Punt   . SEPTOPLASTY    . UMBILICAL HERNIA REPAIR  2010   Social History   Socioeconomic History  . Marital status: Married    Spouse name: Not on file  . Number of children: Not on file  . Years of education: Not on file  . Highest education level: Not on file  Occupational History  . Occupation: Retired  Tobacco Use  . Smoking status: Never Smoker  . Smokeless tobacco: Never Used  Substance and Sexual Activity  . Alcohol use: No    Alcohol/week: 0.0 standard drinks  . Drug use: No  . Sexual activity: Not Currently    Partners: Male    Birth control/protection: Post-menopausal  Other Topics Concern  . Not on file  Social History Narrative  .  Not on file   Social Determinants of Health   Financial Resource Strain:   . Difficulty of Paying Living Expenses:   Food Insecurity:   . Worried About Charity fundraiser in the Last Year:   . Arboriculturist in the Last Year:   Transportation Needs:   . Film/video editor (Medical):   Marland Kitchen Lack of Transportation (Non-Medical):   Physical Activity:   . Days of Exercise per Week:   . Minutes of Exercise per Session:   Stress:   . Feeling of Stress :   Social Connections:   . Frequency of Communication with Friends and Family:   . Frequency of Social Gatherings with Friends and Family:   . Attends Religious Services:   . Active Member of Clubs or Organizations:   . Attends Archivist  Meetings:   Marland Kitchen Marital Status:    Allergies  Allergen Reactions  . Molds & Smuts Other (See Comments), Palpitations, Shortness Of Breath and Swelling  . Cefixime Swelling  . Celebrex [Celecoxib] Swelling  . Cobalt Other (See Comments)    Per allergist  . Gabapentin Other (See Comments)    Joint pain  . Levofloxacin Other (See Comments)    Muscle and joint pain  . Molybdenum Other (See Comments)    Per allergist  . Other Other (See Comments)    TEQUIN. TEQUIN - JOINT AND MUSCLE PAIN Other Reaction: painful joints TEQUIN. TEQUIN - JOINT AND MUSCLE PAIN Tantal Metal  . Penicillins Hives  . Robaxin [Methocarbamol]   . Tomato Hives  . Adhesive [Tape] Rash    Sensitive to EKG leads; sensitive to 107M Micropore tape  . Dust Mite Extract Itching, Other (See Comments) and Palpitations  . Tetracycline Hcl Rash    Can take Doxy  . Tetracyclines & Related Rash   Family History  Problem Relation Age of Onset  . Rheumatologic disease Mother   . Diabetes Mother   . Heart attack Father   . Rheumatologic disease Sister   . Arthritis Sister        --Rheumatoid arthritis  . Ovarian cancer Sister 37  . Crohn's disease Brother   . Kidney failure Brother   . Ulcerative colitis Brother   . Glaucoma Brother         Current Outpatient Medications (Hematological):  .  cyanocobalamin (,VITAMIN B-12,) 1000 MCG/ML injection, Inject 1,000 mcg into the muscle as directed.  Current Outpatient Medications (Other):  .  ascorbic acid (VITAMIN C) 500 MG tablet, Take 500 mg by mouth daily. .  Cholecalciferol (VITAMIN D3) 5000 units CAPS, Take 5,000 Units by mouth. Every other day .  Misc Natural Products (TART CHERRY ADVANCED PO), Take 800 mg by mouth daily. .  Multiple Vitamins-Minerals (SUPER MULTI-VITAMIN PO), Take 1 tablet by mouth daily. .  Omega-3 Fatty Acids (FISH OIL) 1000 MG CAPS, Take by mouth. 1 per day .  Probiotic Product (PROBIOTIC ADVANCED PO), Take by mouth. Patient states taking 5  billion, one capsule daily. Suprema Dophilus probiotic .  SYRINGE-NEEDLE, DISP, 3 ML 25G X 1" 3 ML MISC, Inject B12 every 2 weeks.   Reviewed prior external information including notes and imaging from  primary care provider As well as notes that were available from care everywhere and other healthcare systems.  Past medical history, social, surgical and family history all reviewed in electronic medical record.  No pertanent information unless stated regarding to the chief complaint.   Review of Systems:  No  headache, visual changes, nausea, vomiting, diarrhea, constipation, dizziness, abdominal pain, skin rash, fevers, chills, night sweats, weight loss, swollen lymph nodes, , chest pain, shortness of breath, mood changes. POSITIVE muscle aches, body aches, joint swelling  Objective  Blood pressure (!) 158/98, pulse 100, height 5' (1.524 m), weight 232 lb (105.2 kg), last menstrual period 01/16/2002, SpO2 97 %.   General: No apparent distress alert and oriented x3 mood and affect normal, dressed appropriately. Overweight HEENT: Pupils equal, extraocular movements intact  Respiratory: Patient's speak in full sentences and does not appear short of breath  Cardiovascular: No lower extremity edema, non tender, no erythema  Neuro: Cranial nerves II through XII are intact, neurovascularly intact in all extremities with 2+ DTRs and 2+ pulses.  Gait antalgic Back exam loss of lordosis. Minimal sidebending and rotation of the lower back. Poor core strength noted. Tender mostly over the sacroiliac joint and mild over the lumbar area.  Right knee exam shows the patient does have near full range of motion which is somewhat of improvement but continued instability of valgus and varus force. Tenderness over the lateral joint line and more in the popliteal.  Osteopathic findings  C4 flexed rotated and side bent left C6 flexed rotated and side bent left T3 extended rotated and side bent right inhaled  third rib T9 extended rotated and side bent left L3 flexed rotated and side bent right Sacrum right on right    Impression and Recommendations: Patient was measured and fitted for an off-the-shelf brace. Adjustments were made to brace to ensure proper fit.      This case required medical decision making of moderate complexity. The above documentation has been reviewed and is accurate and complete [name], DO       Note: This dictation was prepared with Dragon dictation along with smaller phrase technology. Any transcriptional errors that result from this process are unintentional.

## 2019-06-14 ENCOUNTER — Encounter: Payer: Self-pay | Admitting: Family Medicine

## 2019-06-14 NOTE — Assessment & Plan Note (Signed)

## 2019-06-14 NOTE — Assessment & Plan Note (Signed)
Likely playing a role. Discussed icing regimen. Discussed continuing the allopurinol will be a chronic condition

## 2019-06-14 NOTE — Assessment & Plan Note (Signed)
Patient has had quite significant difficulty. Likely does need replacement but patient is trying to avoid especially secondary to potential metal allergy. Patient had been doing relatively well and I believe is having more of a popliteal tendinitis. Discussed decreasing range of motion exercises to a certain degree. Can consider the compression. Worsening pain consider injection but patient has been hesitant to start that again. I agree with plan at this moment we will continue to monitor.

## 2019-06-14 NOTE — Assessment & Plan Note (Signed)
Patient did have some navicular stress fracture before. Pneumatic brace given today to try to give patient some improvement with the arch of the foot and alleviate some of the discomfort. Discussed potential heel lift. Increase activity slowly. Follow-up again in 4 to 6 weeks

## 2019-07-17 ENCOUNTER — Ambulatory Visit: Payer: Medicare Other | Admitting: Family Medicine

## 2019-07-24 ENCOUNTER — Other Ambulatory Visit: Payer: Self-pay | Admitting: Obstetrics and Gynecology

## 2019-07-24 DIAGNOSIS — Z1231 Encounter for screening mammogram for malignant neoplasm of breast: Secondary | ICD-10-CM

## 2019-07-28 ENCOUNTER — Ambulatory Visit: Payer: Medicare Other

## 2019-07-30 ENCOUNTER — Ambulatory Visit (INDEPENDENT_AMBULATORY_CARE_PROVIDER_SITE_OTHER): Payer: Medicare Other | Admitting: Family Medicine

## 2019-07-30 ENCOUNTER — Other Ambulatory Visit: Payer: Self-pay

## 2019-07-30 ENCOUNTER — Encounter: Payer: Self-pay | Admitting: Family Medicine

## 2019-07-30 VITALS — BP 160/110 | HR 105 | Wt 233.0 lb

## 2019-07-30 DIAGNOSIS — M109 Gout, unspecified: Secondary | ICD-10-CM

## 2019-07-30 DIAGNOSIS — G8929 Other chronic pain: Secondary | ICD-10-CM | POA: Diagnosis not present

## 2019-07-30 DIAGNOSIS — M545 Low back pain, unspecified: Secondary | ICD-10-CM

## 2019-07-30 DIAGNOSIS — M5030 Other cervical disc degeneration, unspecified cervical region: Secondary | ICD-10-CM | POA: Diagnosis not present

## 2019-07-30 DIAGNOSIS — M170 Bilateral primary osteoarthritis of knee: Secondary | ICD-10-CM | POA: Diagnosis not present

## 2019-07-30 DIAGNOSIS — M999 Biomechanical lesion, unspecified: Secondary | ICD-10-CM | POA: Diagnosis not present

## 2019-07-30 NOTE — Patient Instructions (Signed)
You are doing well  Keep it up  Thoracic rotations.  Yoga wheel for hubby- forgot to write on his  Interested to see how neck goes.  See me again in 6ish weeks

## 2019-07-30 NOTE — Progress Notes (Signed)
Kerry [name] [address] [place] Phone: [phone] Subjective:   I Kerry [name] am serving as a Education administrator for Dr. Hulan Saas.  This visit occurred during the SARS-CoV-2 public health emergency.  Safety protocols were in place, including screening questions prior to the visit, additional usage of staff PPE, and extensive cleaning of exam room while observing appropriate contact time as indicated for disinfecting solutions.   I'm seeing this patient by the request  of:  Street, Kerry Mt, MD  CC:   [id number]   06/13/2019 Patient has had quite significant difficulty. Likely does need replacement but patient is trying to avoid especially secondary to potential metal allergy. Patient had been doing relatively well and I believe is having more of a popliteal tendinitis. Discussed decreasing range of motion exercises to a certain degree. Can consider the compression. Worsening pain consider injection but patient has been hesitant to start that again. I agree with plan at this moment we will continue to monitor.  Update 07/30/2019 Kerry [name] is a 69 y.o. female coming in with complaint of left foot and neck pain. Patient states she is having right knee and mid back pain. Aircast for the foot caused increased pain. Right knee is the same as usual. Pops a lot with pain in the posterior right side of the knee. Patient states she had a lot of activity in the beginning of the month causing mid back pain. Traveling last week. With sitting long periods of time she states she has issues back aches.  Patient is having manipulation done for the apparatus more regularly at the moment by another provider seen some results with the range of motion of the neck. Does not notice any improvement with the allopurinol at the moment.  Recently did have there well check but only found to have hard water      Past Medical History:  Diagnosis Date  . Abnormal  uterine bleeding   . Allergic rhinitis, cause unspecified   . Arthritis of knee, right   . Bursitis of left shoulder   . 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 Chronic pain   . Dyspareunia   . Elevated cholesterol   . Fibroid   . Foot fracture, left 01/2015   and torn tendons  . GERD (gastroesophageal reflux disease)   . HTN (hypertension)   . Kidney stones   . Sleep apnea   . Urinary incontinence    Past Surgical History:  Procedure Laterality Date  .  dilatation and curettage  2000  . CARPAL TUNNEL RELEASE  1998  . CATARACT EXTRACTION Bilateral   . dental implants  2010  . DENTAL SURGERY    . ESOPHAGEAL DILATION  2010  . HAMMER TOE SURGERY     rt  . PELVIC LAPAROSCOPY  1990  . ROTATOR CUFF REPAIR Right 09/2011   Dr. Tamera Punt  . ROTATOR CUFF REPAIR Left 11/11/13   Dr. Tamera Punt   . SEPTOPLASTY    . UMBILICAL HERNIA REPAIR  2010   Social History   Socioeconomic History  . Marital status: Married    Spouse name: Not on file  . Number of children: Not on file  . Years of education: Not on file  . Highest education level: Not on file  Occupational History  . Occupation: Retired  Tobacco Use  . Smoking status: Never Smoker  . Smokeless tobacco: Never Used  Vaping Use  . Vaping Use: Never used  Substance and Sexual Activity  . Alcohol use: No    Alcohol/week: 0.0 standard drinks  . Drug use: No  . Sexual activity: Not Currently    Partners: Male    Birth control/protection: Post-menopausal  Other Topics Concern  . Not on file  Social History Narrative  . Not on file   Social Determinants of Health   Financial Resource Strain:   . Difficulty of Paying Living Expenses:   Food Insecurity:   . Worried About Charity fundraiser in the Last Year:   . Arboriculturist in the Last Year:   Transportation Needs:   . Film/video editor (Medical):   Marland Kitchen Lack of Transportation (Non-Medical):   Physical Activity:   . Days of Exercise per Week:   . Minutes of  Exercise per Session:   Stress:   . Feeling of Stress :   Social Connections:   . Frequency of Communication with Friends and Family:   . Frequency of Social Gatherings with Friends and Family:   . Attends Religious Services:   . Active Member of Clubs or Organizations:   . Attends Archivist Meetings:   Marland Kitchen Marital Status:    Allergies  Allergen Reactions  . Molds & Smuts Other (See Comments), Palpitations, Shortness Of Breath and Swelling  . Cefixime Swelling  . Celebrex [Celecoxib] Swelling  . Cobalt Other (See Comments)    Per allergist  . Gabapentin Other (See Comments)    Joint pain  . Levofloxacin Other (See Comments)    Muscle and joint pain  . Molybdenum Other (See Comments)    Per allergist  . Other Other (See Comments)    TEQUIN. TEQUIN - JOINT AND MUSCLE PAIN Other Reaction: painful joints TEQUIN. TEQUIN - JOINT AND MUSCLE PAIN Tantal Metal  . Penicillins Hives  . Robaxin [Methocarbamol]   . Tomato Hives  . Adhesive [Tape] Rash    Sensitive to EKG leads; sensitive to 11M Micropore tape  . Dust Mite Extract Itching, Other (See Comments) and Palpitations  . Tetracycline Hcl Rash    Can take Doxy  . Tetracyclines & Related Rash   Family History  Problem Relation Age of Onset  . Rheumatologic disease Mother   . Diabetes Mother   . Heart attack Father   . Rheumatologic disease Sister   . Arthritis Sister        --Rheumatoid arthritis  . Ovarian cancer Sister 72  . Crohn's disease Brother   . Kidney failure Brother   . Ulcerative colitis Brother   . Glaucoma Brother        Current Outpatient Medications (Analgesics):  .  allopurinol (ZYLOPRIM) 100 MG tablet, Take 100 mg by mouth daily.  Current Outpatient Medications (Hematological):  .  cyanocobalamin (,VITAMIN B-12,) 1000 MCG/ML injection, Inject 1,000 mcg into the muscle as directed.  Current Outpatient Medications (Other):  .  ascorbic acid (VITAMIN C) 500 MG tablet, Take 500 mg by  mouth daily. .  Cholecalciferol (VITAMIN D3) 5000 units CAPS, Take 5,000 Units by mouth. Every other day .  Misc Natural Products (TART CHERRY ADVANCED PO), Take 400 mg by mouth daily.  .  Multiple Vitamins-Minerals (SUPER MULTI-VITAMIN PO), Take 1 tablet by mouth daily. .  Omega-3 Fatty Acids (FISH OIL) 1000 MG CAPS, Take by mouth. 1 per day .  Probiotic Product (PROBIOTIC ADVANCED PO), Take by mouth. Patient states taking 5 billion, one capsule daily. Suprema Dophilus probiotic .  SYRINGE-NEEDLE, DISP, 3 ML 25G  X 1" 3 ML MISC, Inject B12 every 2 weeks.   Reviewed prior external information including notes and imaging from  primary care provider As well as notes that were available from care everywhere and other healthcare systems.  Past medical history, social, surgical and family history all reviewed in electronic medical record.  No pertanent information unless stated regarding to the chief complaint.   Review of Systems:  No headache, visual changes, nausea, vomiting, diarrhea, constipation, dizziness, abdominal pain, skin rash, fevers, chills, night sweats, weight loss, swollen lymph nodes, body aches, joint swelling, chest pain, shortness of breath, mood changes. POSITIVE muscle aches  Objective  Blood pressure (!) 160/110, pulse (!) 105, weight 233 lb (105.7 kg), last menstrual period 01/16/2002, SpO2 97 %.   General: No apparent distress alert and oriented x3 mood and affect normal, dressed appropriately.  Overweight HEENT: Pupils equal, extraocular movements intact  Respiratory: Patient's speak in full sentences and does not appear short of breath  Cardiovascular: No lower extremity edema, non tender, no erythema  Neuro: Cranial nerves II through XII are intact, neurovascularly intact in all extremities with 2+ DTRs and 2+ pulses.  Gait severely antalgic Neck exam loss of lordosis.  Patient does have some mild improvement in at least rotation of the neck bilaterally. Low back  exam still significant loss of lordosis with mild degenerative scoliosis.  Tightness of the lower extremities noted bilaterally.  Osteopathic findings C2 flexed rotated and side bent left C7 flexed rotated and side bent left T3 extended rotated and side bent right inhaled third rib T5 extended rotated and side bent left L2 flexed rotated and side bent right L4 flexed rotated and side bent left  sacrum right on right    Impression and Recommendations:     The above documentation has been reviewed and is accurate and complete Kerry Pulley, DO       Note: This dictation was prepared with Dragon dictation along with smaller phrase technology. Any transcriptional errors that result from this process are unintentional.

## 2019-07-31 ENCOUNTER — Encounter: Payer: Self-pay | Admitting: Family Medicine

## 2019-07-31 NOTE — Assessment & Plan Note (Signed)

## 2019-07-31 NOTE — Assessment & Plan Note (Signed)
Continued back pain.  Patient has chronic problem with exacerbation.  Once again very difficult to assess completely likely more of a seronegative rheumatoid arthritis with family history.  Still avoiding significant number of medications.  We have had patient go on allopurinol for some time and when she follows up with primary care provider will be considering the possibility of rechecking uric acid.  Multiple allergies to medications which is a social determinants health and changes her medical management.  Patient will likely does respond somewhat to some manipulation and will continue in 6-week intervals.

## 2019-07-31 NOTE — Assessment & Plan Note (Signed)
Stable but chronic

## 2019-07-31 NOTE — Assessment & Plan Note (Signed)
Patient is still avoiding surgical intervention

## 2019-08-05 ENCOUNTER — Other Ambulatory Visit: Payer: Self-pay

## 2019-08-05 ENCOUNTER — Ambulatory Visit
Admission: RE | Admit: 2019-08-05 | Discharge: 2019-08-05 | Disposition: A | Discharge status: Home / Self Care | Payer: Medicare Other | Source: Ambulatory Visit | Attending: Obstetrics and Gynecology | Admitting: Obstetrics and Gynecology

## 2019-08-05 DIAGNOSIS — Z1231 Encounter for screening mammogram for malignant neoplasm of breast: Secondary | ICD-10-CM | POA: Diagnosis not present

## 2019-08-07 NOTE — Progress Notes (Signed)
69 y.o. G62P2002 Married Caucasian female here for annual exam.    Still having bladder control issues.  Feel she waits too long to empty her bladder.  She forgets to void every 2 hours.  Does not drink coffee, tea, or sodas.  Stopping milk has reduced in her bladder spasms.  She has not taken any medication for overactive and she prefers to avoid this.   Leaks if she sneezes and is upright.   She has seen a urologist in the past for a kidney stone.  She had a cystoscopy, and following this she had decreased problems controlling her urine.  This has improved, however, with time.  She has done physical therapy.   She has tried to loose weight in the past, and she looses and gains.   Patient has been seeing a specialized chiropractor ([facility]) to help with her neck and back pain. She has noticed improvement.  PCP: Christa See, MD    Patient's last menstrual period was 01/16/2002.           Sexually active: No.  The current method of family planning is post menopausal status.    Exercising: Yes.    currently doing some aerobic exercises Smoker:  no  Health Maintenance: Pap: 08-07-18 Neg, 07-28-16 Neg, 06-24-14 Neg History of abnormal Pap:  no MMG: 08-05-19 3D/Neg/density C/Birads1 Colonoscopy: 03-12-18 polyps(7);next due;unsure when next colon is due--thinks 3 years BMD: 09-10-13  Result :Normal TDaP:  03-30-14 Gardasil:   no HIV: Unsure Hep C: 06-27-17 Neg Screening Labs:  PCP.    reports that she has never smoked. She has never used smokeless tobacco. She reports that she does not drink alcohol and does not use drugs.  Past Medical History:  Diagnosis Date  . Abnormal uterine bleeding   . Allergic rhinitis, cause unspecified   . Arthritis of knee, right   . Bursitis of left shoulder   . 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 Chronic pain   . Dyspareunia   . Elevated cholesterol   . Elevated uric acid in blood 2021  . Fibroid   . Foot fracture, left 01/2015   and torn  tendons  . GERD (gastroesophageal reflux disease)   . HTN (hypertension)   . Kidney stones   . Sleep apnea   . Urinary incontinence     Past Surgical History:  Procedure Laterality Date  .  dilatation and curettage  2000  . CARPAL TUNNEL RELEASE  1998  . CATARACT EXTRACTION Bilateral   . dental implants  2010  . DENTAL SURGERY    . ESOPHAGEAL DILATION  2010  . HAMMER TOE SURGERY     rt  . PELVIC LAPAROSCOPY  1990  . ROTATOR CUFF REPAIR Right 09/2011   Dr. Tamera Punt  . ROTATOR CUFF REPAIR Left 11/11/13   Dr. Tamera Punt   . SEPTOPLASTY    . UMBILICAL HERNIA REPAIR  2010    Current Outpatient Medications  Medication Sig Dispense Refill  . allopurinol (ZYLOPRIM) 100 MG tablet Take 100 mg by mouth daily.    Marland Kitchen ascorbic acid (VITAMIN C) 500 MG tablet Take 500 mg by mouth daily.    . Cholecalciferol (VITAMIN D3) 5000 units CAPS Take 5,000 Units by mouth. Every other day    . cyanocobalamin (,VITAMIN B-12,) 1000 MCG/ML injection Inject 1,000 mcg into the muscle as directed.    . Misc Natural Products (TART CHERRY ADVANCED PO) Take 400 mg by mouth daily.     . Multiple Vitamins-Minerals (  SUPER MULTI-VITAMIN PO) Take 1 tablet by mouth daily.    . Omega-3 Fatty Acids (FISH OIL) 1000 MG CAPS Take by mouth. 1 per day    . Probiotic Product (PROBIOTIC ADVANCED PO) Take by mouth. Patient states taking 5 billion, one capsule daily. Suprema Dophilus probiotic    . SYRINGE-NEEDLE, DISP, 3 ML 25G X 1" 3 ML MISC Inject B12 every 2 weeks. 50 each 0   No current facility-administered medications for this visit.    Family History  Problem Relation Age of Onset  . Rheumatologic disease Mother   . Diabetes Mother   . Heart attack Father   . Rheumatologic disease Sister   . Arthritis Sister        --Rheumatoid arthritis  . Ovarian cancer Sister 59  . Crohn's disease Brother   . Kidney failure Brother   . Ulcerative colitis Brother   . Glaucoma Brother     Review of Systems  All other  systems reviewed and are negative.   Exam:   BP (!) 182/88   Pulse 100   Resp 22   Ht 4' 11.5" (1.511 m)   Wt (!) 229 lb (103.9 kg)   LMP 01/16/2002   BMI 45.48 kg/m     General appearance: alert, cooperative and appears stated age Head: normocephalic, without obvious abnormality, atraumatic Neck: no adenopathy, supple, symmetrical, trachea midline and thyroid normal to inspection and palpation Lungs: clear to auscultation bilaterally Breasts: normal appearance, no masses or tenderness, No nipple retraction or dimpling, No nipple discharge or bleeding, No axillary adenopathy Heart: regular rate and rhythm Abdomen: soft, non-tender; no masses, no organomegaly Extremities: extremities normal, atraumatic, no cyanosis or edema Skin: skin color, texture, turgor normal. No rashes or lesions Lymph nodes: cervical, supraclavicular, and axillary nodes normal. Neurologic: grossly normal  Pelvic: External genitalia:  no lesions              No abnormal inguinal nodes palpated.              Urethra:  normal appearing urethra with no masses, tenderness or lesions              Bartholins and Skenes: normal                 Vagina: normal appearing vagina with normal color and discharge, no lesions              Cervix: no lesions              Pap taken: No. Bimanual Exam:  Uterus:  normal size, contour, position, consistency, mobility, non-tender              Adnexa: no mass, fullness, tenderness              Rectal exam: Yes.  .  Confirms.              Anus:  normal sphincter tone, no lesions  Chaperone was present for exam.  Assessment:   Well woman visit with normal exam. Uterine and cervical fibroid. FH ovarian cancer.  Normal yearly pelvic US.  Normal CA125. Mixed incontinence.  Remote hx of abnormal pap smear.  BMI 45.48. Elevated blood pressure readings on chart reviews.  Plan: Mammogram screening discussed. Self breast awareness reviewed. Pap and HR HPV as above. Guidelines  for Calcium, Vitamin D, regular exercise program including cardiovascular and weight bearing exercise. We discussed weight management through a physician based program. Follow up PCP for recheck of  her blood pressure.  Yearly pelvic US to check ovaries.  Follow up annually and prn.   After visit summary provided.

## 2019-08-11 ENCOUNTER — Ambulatory Visit (INDEPENDENT_AMBULATORY_CARE_PROVIDER_SITE_OTHER): Payer: Medicare Other | Admitting: Obstetrics and Gynecology

## 2019-08-11 ENCOUNTER — Encounter: Payer: Self-pay | Admitting: Obstetrics and Gynecology

## 2019-08-11 ENCOUNTER — Other Ambulatory Visit: Payer: Self-pay

## 2019-08-11 VITALS — BP 182/88 | HR 100 | Resp 22 | Ht 59.5 in | Wt 229.0 lb

## 2019-08-11 DIAGNOSIS — Z01419 Encounter for gynecological examination (general) (routine) without abnormal findings: Secondary | ICD-10-CM

## 2019-08-11 DIAGNOSIS — Z8041 Family history of malignant neoplasm of ovary: Secondary | ICD-10-CM | POA: Diagnosis not present

## 2019-08-11 DIAGNOSIS — Z124 Encounter for screening for malignant neoplasm of cervix: Secondary | ICD-10-CM

## 2019-08-11 NOTE — Patient Instructions (Signed)

## 2019-08-21 ENCOUNTER — Other Ambulatory Visit: Payer: Self-pay | Admitting: Family Medicine

## 2019-08-29 DIAGNOSIS — M109 Gout, unspecified: Secondary | ICD-10-CM | POA: Diagnosis not present

## 2019-08-29 DIAGNOSIS — N1831 Chronic kidney disease, stage 3a: Secondary | ICD-10-CM | POA: Diagnosis not present

## 2019-08-29 DIAGNOSIS — E790 Hyperuricemia without signs of inflammatory arthritis and tophaceous disease: Secondary | ICD-10-CM | POA: Diagnosis not present

## 2019-08-29 DIAGNOSIS — E538 Deficiency of other specified B group vitamins: Secondary | ICD-10-CM | POA: Diagnosis not present

## 2019-08-29 DIAGNOSIS — M5136 Other intervertebral disc degeneration, lumbar region: Secondary | ICD-10-CM | POA: Diagnosis not present

## 2019-09-09 ENCOUNTER — Encounter: Payer: Self-pay | Admitting: Family Medicine

## 2019-09-09 ENCOUNTER — Ambulatory Visit (INDEPENDENT_AMBULATORY_CARE_PROVIDER_SITE_OTHER): Payer: Medicare Other | Admitting: Family Medicine

## 2019-09-09 ENCOUNTER — Other Ambulatory Visit: Payer: Self-pay

## 2019-09-09 VITALS — BP 142/88 | HR 100 | Ht 59.0 in | Wt 229.0 lb

## 2019-09-09 DIAGNOSIS — M999 Biomechanical lesion, unspecified: Secondary | ICD-10-CM

## 2019-09-09 DIAGNOSIS — M545 Low back pain, unspecified: Secondary | ICD-10-CM

## 2019-09-09 DIAGNOSIS — G8929 Other chronic pain: Secondary | ICD-10-CM

## 2019-09-09 NOTE — Patient Instructions (Signed)
1 multivitamin I like uloric See me in 6 weeks

## 2019-09-09 NOTE — Progress Notes (Signed)
[place] Phone: [phone] Subjective:   Kerry [name], am serving as a scribe for Dr. Hulan Saas. This visit occurred during the SARS-CoV-2 public health emergency.  Safety protocols were in place, including screening questions prior to the visit, additional usage of staff PPE, and extensive cleaning of exam room while observing appropriate contact time as indicated for disinfecting solutions.   I'm seeing this patient by the request  of:  Street, [name], MD  CC: Low back pain and multiple complaint follow-up  [id number]  Kerry [name] is a 69 y.o. female coming in with complaint of back and neck pain. OMT 07/30/2019. Patient states that she had an increase in left shoulder and trap pain due to not having neck adjustment last visit.  Patient started a multivitamin wants to know if it was okay.  Scanned and labeled to the chart.  Patient has not noticed any significant improvement.  Primary care provider has changed allopurinol to Uloric.  Patient states that B12 is not helping the pain but is helping with some mood sometimes.  Medications patient has been prescribed: Mostly over-the-counter medications        Reviewed prior external information including notes and imaging from previsou exam, outside providers and external EMR if available.   As well as notes that were available from care everywhere and other healthcare systems.  Past medical history, social, surgical and family history all reviewed in electronic medical record.  [name] pertanent information unless stated regarding to the chief complaint.   Past Medical History:  Diagnosis Date  . Abnormal uterine bleeding   . Allergic rhinitis, cause unspecified   . Arthritis of knee, right   . Bursitis of left shoulder   . 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 Chronic pain   . Dyspareunia   . Elevated cholesterol   . Elevated uric  acid in blood 2021  . Fibroid   . Foot fracture, left 01/2015   and torn tendons  . GERD (gastroesophageal reflux disease)   . HTN (hypertension)   . Kidney stones   . Sleep apnea   . Urinary incontinence     Allergies  Allergen Reactions  . Molds & Smuts Other (See Comments), Palpitations, Shortness Of Breath and Swelling  . Cefixime Swelling  . Celebrex [Celecoxib] Swelling  . Cobalt Other (See Comments)    Per allergist  . Gabapentin Other (See Comments)    Joint pain  . Levofloxacin Other (See Comments)    Muscle and joint pain  . Molybdenum Other (See Comments)    Per allergist  . Other Other (See Comments)    TEQUIN. TEQUIN - JOINT AND MUSCLE PAIN Other Reaction: painful joints TEQUIN. TEQUIN - JOINT AND MUSCLE PAIN Tantal Metal  . Penicillins Hives  . Robaxin [Methocarbamol]   . Tomato Hives  . Adhesive [Tape] Rash    Sensitive to EKG leads; sensitive to 29M Micropore tape  . Dust Mite Extract Itching, Other (See Comments) and Palpitations  . Tetracycline Hcl Rash    Can take Doxy  . Tetracyclines & Related Rash     Review of Systems:  [name] headache, visual changes, nausea, vomiting, diarrhea, constipation, dizziness, abdominal pain, skin rash, fevers, chills, night sweats, weight loss, swollen lymph nodes, body aches, joint swelling, chest pain, shortness of breath, mood changes. POSITIVE muscle aches  Objective  Blood pressure (!) 142/88, pulse 100, height 4\' 11"  (1.499 m), weight  229 lb (103.9 kg), last menstrual period 01/16/2002, SpO2 97 %.   General: [name] apparent distress alert and oriented x3 mood and affect normal, dressed appropriately.  HEENT: Pupils equal, extraocular movements intact  Respiratory: Patient's speak in full sentences and does not appear short of breath  Patient does have significant antalgic gait noted. Significant arthritic changes of multiple joints.  Patient does not have as much swelling of the hands bilaterally.  Patient does have  tenderness in the paraspinal musculature of the lumbar spine.  Loss of lordosis, did not test range of motion very well today.  Patient somatic mild crepitus, tightness noted left greater than right at the moment. Osteopathic findings  C6 flexed rotated and side bent right T5 extended rotated and side bent right inhaled rib T8 extended rotated and side bent left L2 flexed rotated and side bent right Sacrum right on right       Assessment and Plan:  Low back pain Degenerative disc of multiple joints.  Responding somewhat to the manipulation.  Patient was seen another chiropractor that is doing some other things as well and we will continue to see how patient responds.  Did not want to make significant medication changes.  I agree with the change to Uloric for her gout.  Follow-up again 6 weeks    Nonallopathic problems  Decision today to treat with OMT was based on Physical Exam  After verbal consent patient was treated with HVLA, ME, FPR techniques in cervical, rib, thoracic, lumbar, and sacral  areas  Patient tolerated the procedure well with improvement in symptoms  Patient given exercises, stretches and lifestyle modifications  See medications in patient instructions if given  Patient will follow up in 4-8 weeks      The above documentation has been reviewed and is accurate and complete [name], DO       Note: This dictation was prepared with Dragon dictation along with smaller phrase technology. Any transcriptional errors that result from this process are unintentional.

## 2019-09-09 NOTE — Assessment & Plan Note (Signed)
Degenerative disc of multiple joints.  Responding somewhat to the manipulation.  Patient was seen another chiropractor that is doing some other things as well and we will continue to see how patient responds.  Did not want to make significant medication changes.  I agree with the change to Uloric for her gout.  Follow-up again 6 weeks

## 2019-09-10 ENCOUNTER — Encounter: Payer: Self-pay | Admitting: Family Medicine

## 2019-09-23 ENCOUNTER — Ambulatory Visit: Payer: Medicare Other | Admitting: Cardiology

## 2019-10-09 DIAGNOSIS — L918 Other hypertrophic disorders of the skin: Secondary | ICD-10-CM | POA: Diagnosis not present

## 2019-10-09 DIAGNOSIS — D1801 Hemangioma of skin and subcutaneous tissue: Secondary | ICD-10-CM | POA: Diagnosis not present

## 2019-10-09 DIAGNOSIS — L814 Other melanin hyperpigmentation: Secondary | ICD-10-CM | POA: Diagnosis not present

## 2019-10-09 DIAGNOSIS — L578 Other skin changes due to chronic exposure to nonionizing radiation: Secondary | ICD-10-CM | POA: Diagnosis not present

## 2019-10-09 DIAGNOSIS — D1721 Benign lipomatous neoplasm of skin and subcutaneous tissue of right arm: Secondary | ICD-10-CM | POA: Diagnosis not present

## 2019-10-09 DIAGNOSIS — L680 Hirsutism: Secondary | ICD-10-CM | POA: Diagnosis not present

## 2019-10-09 DIAGNOSIS — D224 Melanocytic nevi of scalp and neck: Secondary | ICD-10-CM | POA: Diagnosis not present

## 2019-10-09 DIAGNOSIS — L821 Other seborrheic keratosis: Secondary | ICD-10-CM | POA: Diagnosis not present

## 2019-10-09 DIAGNOSIS — D225 Melanocytic nevi of trunk: Secondary | ICD-10-CM | POA: Diagnosis not present

## 2019-10-09 DIAGNOSIS — D2271 Melanocytic nevi of right lower limb, including hip: Secondary | ICD-10-CM | POA: Diagnosis not present

## 2019-10-16 DIAGNOSIS — H524 Presbyopia: Secondary | ICD-10-CM | POA: Diagnosis not present

## 2019-10-16 DIAGNOSIS — H04123 Dry eye syndrome of bilateral lacrimal glands: Secondary | ICD-10-CM | POA: Diagnosis not present

## 2019-10-16 DIAGNOSIS — Z961 Presence of intraocular lens: Secondary | ICD-10-CM | POA: Diagnosis not present

## 2019-10-16 DIAGNOSIS — H35373 Puckering of macula, bilateral: Secondary | ICD-10-CM | POA: Diagnosis not present

## 2019-10-17 DIAGNOSIS — Z23 Encounter for immunization: Secondary | ICD-10-CM | POA: Diagnosis not present

## 2019-10-22 ENCOUNTER — Other Ambulatory Visit: Payer: Self-pay

## 2019-10-22 ENCOUNTER — Encounter: Payer: Self-pay | Admitting: Family Medicine

## 2019-10-22 ENCOUNTER — Ambulatory Visit (INDEPENDENT_AMBULATORY_CARE_PROVIDER_SITE_OTHER): Payer: Medicare Other | Admitting: Family Medicine

## 2019-10-22 VITALS — BP 140/90 | HR 116 | Ht 59.0 in | Wt 225.0 lb

## 2019-10-22 DIAGNOSIS — G8929 Other chronic pain: Secondary | ICD-10-CM

## 2019-10-22 DIAGNOSIS — M999 Biomechanical lesion, unspecified: Secondary | ICD-10-CM

## 2019-10-22 DIAGNOSIS — M545 Low back pain, unspecified: Secondary | ICD-10-CM | POA: Diagnosis not present

## 2019-10-22 NOTE — Patient Instructions (Addendum)
Good to see you See me again in 6 weeks 

## 2019-10-22 NOTE — Progress Notes (Signed)
[place] [address] [place] Phone: [phone] Subjective:   I [name] am serving as a Education administrator for Dr. Hulan Saas.  This visit occurred during the SARS-CoV-2 public health emergency.  Safety protocols were in place, including screening questions prior to the visit, additional usage of staff PPE, and extensive cleaning of exam room while observing appropriate contact time as indicated for disinfecting solutions.   I'm seeing this patient by the request  of:  Street, Kerry Mt, MD  CC: Multiple joint pain follow-up  [id number]  Kerry [name] is a 69 y.o. female coming in with complaint of back and neck pain. OMT 09/09/2019. Patient states that she has left hip and right knee pain that got better. Upper back is painful today. Left sided rib pain as well.  Patient states that there was some mild exacerbation of the low back after last manipulation.  Patient states then started having increasing now upper neck pain again.  Does not know what it can potentially cause do.  Patient did have the flu shots and did have some mild discomfort immediately.  Since then seems to be getting closer to her baseline with how she is feeling.  Medications patient has been prescribed: B12  Taking: Yes         Reviewed prior external information including notes and imaging from previsou exam, outside providers and external EMR if available.   As well as notes that were available from care everywhere and other healthcare systems.  Past medical history, social, surgical and family history all reviewed in electronic medical record.  No pertanent information unless stated regarding to the chief complaint.   Past Medical History:  Diagnosis Date  . Abnormal uterine bleeding   . Allergic rhinitis, cause unspecified   . Arthritis of knee, right   . Bursitis of left shoulder   . 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 Chronic pain     . Dyspareunia   . Elevated cholesterol   . Elevated uric acid in blood 2021  . Fibroid   . Foot fracture, left 01/2015   and torn tendons  . GERD (gastroesophageal reflux disease)   . HTN (hypertension)   . Kidney stones   . Sleep apnea   . Urinary incontinence     Allergies  Allergen Reactions  . Molds & Smuts Other (See Comments), Palpitations, Shortness Of Breath and Swelling  . Cefixime Swelling  . Celebrex [Celecoxib] Swelling  . Cobalt Other (See Comments)    Per allergist  . Gabapentin Other (See Comments)    Joint pain  . Levofloxacin Other (See Comments)    Muscle and joint pain  . Molybdenum Other (See Comments)    Per allergist  . Other Other (See Comments)    TEQUIN. TEQUIN - JOINT AND MUSCLE PAIN Other Reaction: painful joints TEQUIN. TEQUIN - JOINT AND MUSCLE PAIN Tantal Metal  . Penicillins Hives  . Robaxin [Methocarbamol]   . Tomato Hives  . Adhesive [Tape] Rash    Sensitive to EKG leads; sensitive to 34M Micropore tape  . Dust Mite Extract Itching, Other (See Comments) and Palpitations  . Tetracycline Hcl Rash    Can take Doxy  . Tetracyclines & Related Rash     Review of Systems:  No headache, visual changes, nausea, vomiting, diarrhea, constipation, dizziness, abdominal pain, skin rash, fevers, chills, night sweats, weight loss, swollen lymph nodes,chest pain, shortness of breath, mood changes. POSITIVE muscle aches,  body aches, joint swelling  Objective  Last menstrual period 01/16/2002.   General: No apparent distress alert and oriented x3 mood and affect normal, dressed appropriately.  HEENT: Pupils equal, extraocular movements intact  Respiratory: Patient's speak in full sentences and does not appear short of breath  Cardiovascular: No lower extremity edema, non tender, no erythema  Severe arthritic changes of multiple joints. Gait severely antalgic Patient's low back does have loss of lordosis. Patient has decreased range of motion  secondary to tightness. Patient has some mild crepitus noted. Strength testing of the lower extremities do show that they are minorly weak but symmetric.  Osteopathic findings  C6 flexed rotated and side bent left T5 extended rotated and side bent right inhaled rib T9 extended rotated and side bent left L2 flexed rotated and side bent right L4 flexed rotated and side bent left Sacrum right on right       Assessment and Plan:  Low back pain Chronic overall.  Still concern for more of the uric acid arthropathy as well as potentially a seronegative rheumatoid arthritis.  Do not think with knowing patient over the course of 11 years different medications we have discussed before would be beneficial at this time on with patient having significant number of allergies could cause more harm than good.  Continue to try to do the osteopathic manipulation intermittently. Total time with patient as well as reviewing her notes as well as her MyChart messages 33 minutes on the date of service.  Nonallopathic problems  Decision today to treat with OMT was based on Physical Exam  After verbal consent patient was treated with  ME, FPR techniques in cervical, rib, thoracic, lumbar, and sacral  areas  Patient tolerated the procedure well with improvement in symptoms  Patient given exercises, stretches and lifestyle modifications  See medications in patient instructions if given  Patient will follow up in 4-8 weeks      The above documentation has been reviewed and is accurate and complete [name], DO       Note: This dictation was prepared with Dragon dictation along with smaller phrase technology. Any transcriptional errors that result from this process are unintentional.

## 2019-10-22 NOTE — Assessment & Plan Note (Signed)
Chronic overall.  Still concern for more of the uric acid arthropathy as well as potentially a seronegative rheumatoid arthritis.  Do not think with knowing patient over the course of 11 years different medications we have discussed before would be beneficial at this time on with patient having significant number of allergies could cause more harm than good.  Continue to try to do the osteopathic manipulation intermittently.

## 2019-12-03 ENCOUNTER — Ambulatory Visit: Payer: Medicare Other | Admitting: Family Medicine

## 2019-12-25 DIAGNOSIS — I110 Hypertensive heart disease with heart failure: Secondary | ICD-10-CM | POA: Diagnosis not present

## 2019-12-25 DIAGNOSIS — I5189 Other ill-defined heart diseases: Secondary | ICD-10-CM | POA: Diagnosis not present

## 2019-12-25 DIAGNOSIS — I5032 Chronic diastolic (congestive) heart failure: Secondary | ICD-10-CM | POA: Diagnosis not present

## 2019-12-25 DIAGNOSIS — E790 Hyperuricemia without signs of inflammatory arthritis and tophaceous disease: Secondary | ICD-10-CM | POA: Diagnosis not present

## 2019-12-29 ENCOUNTER — Other Ambulatory Visit: Payer: Self-pay

## 2019-12-29 ENCOUNTER — Encounter: Payer: Self-pay | Admitting: Family Medicine

## 2019-12-29 ENCOUNTER — Ambulatory Visit (INDEPENDENT_AMBULATORY_CARE_PROVIDER_SITE_OTHER): Payer: Medicare Other | Admitting: Family Medicine

## 2019-12-29 VITALS — BP 146/98 | HR 104 | Ht 59.0 in | Wt 226.4 lb

## 2019-12-29 DIAGNOSIS — G8929 Other chronic pain: Secondary | ICD-10-CM | POA: Diagnosis not present

## 2019-12-29 DIAGNOSIS — M999 Biomechanical lesion, unspecified: Secondary | ICD-10-CM

## 2019-12-29 DIAGNOSIS — M545 Low back pain, unspecified: Secondary | ICD-10-CM | POA: Diagnosis not present

## 2019-12-29 NOTE — Patient Instructions (Addendum)
Good to see you!  Try to stay active.  I will do some research on shoes for you  See me again in 1-2 months.

## 2019-12-29 NOTE — Progress Notes (Signed)
[facility] [place] Phone: [phone] Subjective:   Kerry [name], LAT, ATC acting as a scribe for Charlann Boxer, DO  I'm seeing this patient by the request  of:  Street, [name], MD  CC: Multiple joint complaints  [id number]  Kerry [name] is a 69 y.o. female coming in with complaint of back and neck pain. OMT 10/22/2019. Patient states aches and pains after a COVID booster are improving. Pt feels the inflammation following the shot has improved. Pt reports multiple areas of pain including; L foot, R knee, L hip, L SI joint, thoracic ribs, L shoulder, and neck. Rx tried: stretches  Taking: none  Patient states that she just had more aches and pains and recently over the course the last several days has started noticing mild improvement again.  Patient did have some inflammation previously and we did get removal of different dental implants.  Wondering to know if she still has potential infectious changes of the jaw that could be contributing.  Has not seen anyone since 2020 with her oral surgeon.  Patient would like my opinion on this.  Patient was getting labs by primary care provider.  Patient was going to have her uric acid checked.  Last time we checked it was having elevation again at 7.7 and patient is on Uloric  It has been longer than patient's usual home interval since we have seen her for osteopathic manipulation secondary to having still a fever after the Covid.       Reviewed prior external information including notes and imaging from previsou exam, outside providers and external EMR if available.   As well as notes that were available from care everywhere and other healthcare systems.  Past medical history, social, surgical and family history all reviewed in electronic medical record.  No pertanent information unless stated regarding to the chief complaint.   Past Medical History:   Diagnosis Date   Abnormal uterine bleeding    Allergic rhinitis, cause unspecified    Arthritis of knee, right    Bursitis of left shoulder    Cherry hemangioma 07/28/15   vulva   Chronic pain    Dyspareunia    Elevated cholesterol    Elevated uric acid in blood 2021   Fibroid    Foot fracture, left 01/2015   and torn tendons   GERD (gastroesophageal reflux disease)    HTN (hypertension)    Kidney stones    Sleep apnea    Urinary incontinence     Allergies  Allergen Reactions   Molds & Smuts Other (See Comments), Palpitations, Shortness Of Breath and Swelling   Cefixime Swelling   Celebrex [Celecoxib] Swelling   Cobalt Other (See Comments)    Per allergist   Gabapentin Other (See Comments)    Joint pain   Levofloxacin Other (See Comments)    Muscle and joint pain   Molybdenum Other (See Comments)    Per allergist   Other Other (See Comments)    TEQUIN. TEQUIN - JOINT AND MUSCLE PAIN Other Reaction: painful joints TEQUIN. TEQUIN - JOINT AND MUSCLE PAIN Tantal Metal   Penicillins Hives   Robaxin [Methocarbamol]    Tomato Hives   Adhesive [Tape] Rash    Sensitive to EKG leads; sensitive to 3M Micropore tape   Dust Mite Extract Itching, Other (See Comments) and Palpitations   Tetracycline Hcl Rash    Can take Doxy   Tetracyclines & Related Rash  Review of Systems:  No headache, visual changes, nausea, vomiting, diarrhea, constipation, dizziness, abdominal pain, skin rash, fevers, chills, night sweats, weight loss, swollen lymph nodes,chest pain, shortness of breath, mood changes. POSITIVE muscle aches, body aches, joint swelling  Objective  Blood pressure (!) 146/98, pulse (!) 104, height 4\' 11"  (1.499 m), weight 226 lb 6.4 oz (102.7 kg), last menstrual period 01/16/2002, SpO2 97 %.   General: No apparent distress alert and oriented x3 mood and affect normal, dressed appropriately.  HEENT: Pupils equal, extraocular movements  intact  Respiratory: Patient's speak in full sentences and does not appear short of breath  Cardiovascular: No lower extremity edema, non tender, no erythema  Back -back exam significant loss of lordosis.  Patient does have tenderness noted.  Patient secondary to body habitus and did not do FABER test.  Neurovascularly intact distally. Significant arthritic changes noted of multiple joints.  Patient has trace swelling of the knees bilaterally.  Continues to have mild swelling of the hands as well. Limited range of motion of the neck noted Antalgic gait worsening discomfort around the navicular bone again on the ankles actually bilaterally.  Osteopathic findings  C6 flexed rotated and side bent left T8 extended rotated and side bent left with inhaled rib L2 flexed rotated and side bent right Sacrum right on right       Assessment and Plan:  Low back pain Continues to have chronic back pain.  With exacerbation of underlying cause.  Patient did have inflammation and appears of multiple different joints she had after the booster exam.  Still concern for seronegative autoimmune disease.  Patient has seen multiple providers and we have done multiple tests without any significant findings.  Patient still is not willing to try certain medications such as methotrexate.  Patient has had multiple different allergies and medications which does make it difficult.  We discussed that patient has had elevated oxalate, uric acid and strong family history of autoimmune diseases.  Patient is seeing primary care and we are attempting to consider other ways of further work-up.  Follow-up again 4 to 6 weeks total time with patient today greater than 31 minutes after addressing patient's questions, reading her emails that were sent on the day of exam and further evaluation and treatment.    Nonallopathic problems  Decision today to treat with OMT was based on Physical Exam  After verbal consent patient was  treated with HVLA, ME, FPR techniques in cervical, rib, thoracic, lumbar, and sacral  areas  Patient tolerated the procedure well with improvement in symptoms  Patient given exercises, stretches and lifestyle modifications  See medications in patient instructions if given  Patient will follow up in 4-8 weeks      The above documentation has been reviewed and is accurate and complete [name], DO       Note: This dictation was prepared with Dragon dictation along with smaller phrase technology. Any transcriptional errors that result from this process are unintentional.

## 2019-12-29 NOTE — Assessment & Plan Note (Addendum)
Continues to have chronic back pain.  With exacerbation of underlying cause.  Patient did have inflammation and appears of multiple different joints she had after the booster exam.  Still concern for seronegative autoimmune disease.  Patient has seen multiple providers and we have done multiple tests without any significant findings.  Patient still is not willing to try certain medications such as methotrexate.  Patient has had multiple different allergies and medications which does make it difficult.  We discussed that patient has had elevated oxalate, uric acid and strong family history of autoimmune diseases.  Patient is seeing primary care and we are attempting to consider other ways of further work-up.  Follow-up again 4 to 6 weeks total time with patient today greater than 31 minutes after addressing patient's questions, reading her emails that were sent on the day of exam and further evaluation and treatment.

## 2020-01-29 ENCOUNTER — Other Ambulatory Visit: Payer: Self-pay

## 2020-01-29 ENCOUNTER — Encounter: Payer: Self-pay | Admitting: Family Medicine

## 2020-01-29 ENCOUNTER — Ambulatory Visit (INDEPENDENT_AMBULATORY_CARE_PROVIDER_SITE_OTHER): Payer: Medicare Other

## 2020-01-29 ENCOUNTER — Ambulatory Visit (INDEPENDENT_AMBULATORY_CARE_PROVIDER_SITE_OTHER): Payer: Medicare Other | Admitting: Family Medicine

## 2020-01-29 VITALS — BP 160/90 | HR 91 | Ht 59.0 in | Wt 227.0 lb

## 2020-01-29 DIAGNOSIS — M5030 Other cervical disc degeneration, unspecified cervical region: Secondary | ICD-10-CM

## 2020-01-29 DIAGNOSIS — G5602 Carpal tunnel syndrome, left upper limb: Secondary | ICD-10-CM | POA: Diagnosis not present

## 2020-01-29 DIAGNOSIS — M999 Biomechanical lesion, unspecified: Secondary | ICD-10-CM | POA: Diagnosis not present

## 2020-01-29 DIAGNOSIS — G894 Chronic pain syndrome: Secondary | ICD-10-CM | POA: Diagnosis not present

## 2020-01-29 DIAGNOSIS — M542 Cervicalgia: Secondary | ICD-10-CM

## 2020-01-29 MED ORDER — DULOXETINE HCL 20 MG PO CPEP: 20.0000 mg | ORAL_CAPSULE | Freq: Every day | ORAL | 0 refills | Status: DC

## 2020-01-29 NOTE — Patient Instructions (Addendum)
Good to see you Cymbalta 20 mg  Read about cymbalta first but I think it is a good idea Continue to be active and do mild exercises Send me a message in 2 weeks See me again in 4-6

## 2020-01-29 NOTE — Assessment & Plan Note (Signed)
Patient has had chronic pain for quite some time.  Patient's differential still seems to be more of a seronegative rheumatoid arthritis.  Patient has finally had worsening pain and numbness as well as neck pain and notes that is giving her more discomfort and pain on a daily basis and is stopping her from even activities of daily living.  At this point I would like patient to get an x-ray of the neck as well as we will consider a low-dose of Cymbalta.  Warned of potential side effects with patient has had multiple side effects of medications previously.  Patient will read about it but she feels like she needs some type of potential help at this time.  Follow-up with me again 4 to 6 weeks

## 2020-01-29 NOTE — Assessment & Plan Note (Signed)
We will get neck x-ray.  If continuing to have difficulty I would like to consider nerve conduction test to further evaluate

## 2020-01-29 NOTE — Assessment & Plan Note (Signed)
X-ray pending, tried FPR technique and soft tissue mobilization and any muscle energy even today.  Started on Cymbalta low-dose follow-up again 4 to 6 weeks worsening pain consider nerve conduction test and potential MRI

## 2020-01-29 NOTE — Progress Notes (Signed)
[name] Phone: [phone] Subjective:   Kerry [name], am serving as a scribe for Dr. Hulan Saas. This visit occurred during the SARS-CoV-2 public health emergency.  Safety protocols were in place, including screening questions prior to the visit, additional usage of staff PPE, and extensive cleaning of exam room while observing appropriate contact time as indicated for disinfecting solutions.   I'm seeing this patient by the request  of:  Street, [name], MD  CC: Neck pain and back pain follow-up  [id number]  Kerry [name] is a 70 y.o. female coming in with complaint of back and neck pain. OMT 12/29/2019. Patient states that after the last injection she got worse for a few weeks. States she eventually started getting better. States that turning to the left it felt like her neck need to pop and it was catching. Pain radiated to her shoulder to the point she couldn't lift her arm.   Medications patient has been prescribed: Nothing multiple different allergies to medications          Reviewed prior external information including notes and imaging from previsou exam, outside providers and external EMR if available.   As well as notes that were available from care everywhere and other healthcare systems.  Past medical history, social, surgical and family history all reviewed in electronic medical record.  No pertanent information unless stated regarding to the chief complaint.   Past Medical History:  Diagnosis Date  . Abnormal uterine bleeding   . Allergic rhinitis, cause unspecified   . Arthritis of knee, right   . Bursitis of left shoulder   . 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 Chronic pain   . Dyspareunia   . Elevated cholesterol   . Elevated uric acid in blood 2021  . Fibroid   . Foot fracture, left 01/2015   and torn tendons  . GERD (gastroesophageal reflux disease)    . HTN (hypertension)   . Kidney stones   . Sleep apnea   . Urinary incontinence     Allergies  Allergen Reactions  . Molds & Smuts Other (See Comments), Palpitations, Shortness Of Breath and Swelling  . Cefixime Swelling  . Celebrex [Celecoxib] Swelling  . Cobalt Other (See Comments)    Per allergist  . Gabapentin Other (See Comments)    Joint pain  . Levofloxacin Other (See Comments)    Muscle and joint pain  . Molybdenum Other (See Comments)    Per allergist  . Other Other (See Comments)    TEQUIN. TEQUIN - JOINT AND MUSCLE PAIN Other Reaction: painful joints TEQUIN. TEQUIN - JOINT AND MUSCLE PAIN Tantal Metal  . Penicillins Hives  . Robaxin [Methocarbamol]   . Tomato Hives  . Adhesive [Tape] Rash    Sensitive to EKG leads; sensitive to 15M Micropore tape  . Dust Mite Extract Itching, Other (See Comments) and Palpitations  . Tetracycline Hcl Rash    Can take Doxy  . Tetracyclines & Related Rash     Review of Systems:  No headache, visual changes, nausea, vomiting, diarrhea, constipation, dizziness, abdominal pain, skin rash, fevers, chills, night sweats, weight loss, swollen lymph nodes, body aches, joint swelling, chest pain, shortness of breath, mood changes. POSITIVE muscle aches, body aches   Objective  Blood pressure (!) 160/90, pulse 91, height 4\' 11"  (1.499 m), weight 227 lb (103 kg), last menstrual period 01/16/2002, SpO2 98 %.   General: No  apparent distress alert and oriented x3 mood and affect normal, dressed appropriately.  HEENT: Pupils equal, extraocular movements intact  Respiratory: Patient's speak in full sentences and does not appear short of breath  Cardiovascular: 1+ lower extremity edema, non tender, no erythema  Neck exam does have some loss of lordosis.  Patient has tenderness with Spurling's but no true radicular symptoms.  Patient does have some mild increase in discomfort with extension of the neck.  Mild positive Tinel's of the left wrist  noted.  Grip strength though is intact Back -poor core strength, diffuse tenderness of the paraspinal musculature no midline tenderness.  Neurovascular intact in all extremities Antalgic gait  Osteopathic findings  C2 flexed rotated and side bent left C6 flexed rotated and side bent right T3 extended rotated and side bent left inhaled rib T9 extended rotated and side bent right L2 flexed rotated and side bent right Sacrum left on left       Assessment and Plan:  Chronic pain Patient has had chronic pain for quite some time.  Patient's differential still seems to be more of a seronegative rheumatoid arthritis.  Patient has finally had worsening pain and numbness as well as neck pain and notes that is giving her more discomfort and pain on a daily basis and is stopping her from even activities of daily living.  At this point I would like patient to get an x-ray of the neck as well as we will consider a low-dose of Cymbalta.  Warned of potential side effects with patient has had multiple side effects of medications previously.  Patient will read about it but she feels like she needs some type of potential help at this time.  Follow-up with me again 4 to 6 weeks  Carpal tunnel syndrome on left We will get neck x-ray.  If continuing to have difficulty I would like to consider nerve conduction test to further evaluate  Degenerative cervical disc X-ray pending, tried FPR technique and soft tissue mobilization and any muscle energy even today.  Started on Cymbalta low-dose follow-up again 4 to 6 weeks worsening pain consider nerve conduction test and potential MRI    Nonallopathic problems  Decision today to treat with OMT was based on Physical Exam  After verbal consent patient was treated with , soft tissue and counterstrain FPR techniques in cervical, rib, thoracic, lumbar, and sacral  areas  Patient tolerated the procedure well with improvement in symptoms  Patient given exercises,  stretches and lifestyle modifications  See medications in patient instructions if given  Patient will follow up in 4-8 weeks      The above documentation has been reviewed and is accurate and complete [name], DO       Note: This dictation was prepared with Dragon dictation along with smaller phrase technology. Any transcriptional errors that result from this process are unintentional.

## 2020-02-10 DIAGNOSIS — Z79899 Other long term (current) drug therapy: Secondary | ICD-10-CM | POA: Diagnosis not present

## 2020-02-10 DIAGNOSIS — R0600 Dyspnea, unspecified: Secondary | ICD-10-CM | POA: Diagnosis not present

## 2020-02-10 DIAGNOSIS — Z20828 Contact with and (suspected) exposure to other viral communicable diseases: Secondary | ICD-10-CM | POA: Diagnosis not present

## 2020-02-10 DIAGNOSIS — R5383 Other fatigue: Secondary | ICD-10-CM | POA: Diagnosis not present

## 2020-02-10 DIAGNOSIS — R5381 Other malaise: Secondary | ICD-10-CM | POA: Diagnosis not present

## 2020-02-13 ENCOUNTER — Encounter: Payer: Self-pay | Admitting: Family Medicine

## 2020-02-20 DIAGNOSIS — I5032 Chronic diastolic (congestive) heart failure: Secondary | ICD-10-CM | POA: Diagnosis not present

## 2020-02-20 DIAGNOSIS — I110 Hypertensive heart disease with heart failure: Secondary | ICD-10-CM | POA: Diagnosis not present

## 2020-02-20 DIAGNOSIS — N1831 Chronic kidney disease, stage 3a: Secondary | ICD-10-CM | POA: Diagnosis not present

## 2020-02-20 DIAGNOSIS — J329 Chronic sinusitis, unspecified: Secondary | ICD-10-CM | POA: Diagnosis not present

## 2020-02-25 NOTE — Progress Notes (Signed)
[facility] [address] [place] Phone: [phone] Subjective:   I Kerry [name] am serving as a Education administrator for Dr. Hulan Saas.  This visit occurred during the SARS-CoV-2 public health emergency.  Safety protocols were in place, including screening questions prior to the visit, additional usage of staff PPE, and extensive cleaning of exam room while observing appropriate contact time as indicated for disinfecting solutions.   I'm seeing this patient by the request  of:  Street, [name], MD  CC: Multiple complaints  [id number]  Kerry [name] is a 70 y.o. female coming in with complaint of back and neck pain. OMT 01/29/2020. Also complaining of left wrist pain, carpal tunnel last visit. Patient states that last visit worked really well. Left wrist has some numbness. States a lot of the sharp pains are gone. Hasn't started Cymbalta and states her fingers got better.  Medications patient has been prescribed: B12, Cymbalta 20mg     Xray cervical 01/29/2020 IMPRESSION: 1. No acute fracture or subluxation. 2. Multilevel degenerative changes.     Reviewed prior external information including notes and imaging from previsou exam, outside providers and external EMR if available.   As well as notes that were available from care everywhere and other healthcare systems.  Past medical history, social, surgical and family history all reviewed in electronic medical record.  No pertanent information unless stated regarding to the chief complaint.   Past Medical History:  Diagnosis Date  . Abnormal uterine bleeding   . Allergic rhinitis, cause unspecified   . Arthritis of knee, right   . Bursitis of left shoulder   . 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 Chronic pain   . Dyspareunia   . Elevated cholesterol   . Elevated uric acid in blood 2021  . Fibroid   . Foot fracture, left 01/2015   and torn tendons  . GERD  (gastroesophageal reflux disease)   . HTN (hypertension)   . Kidney stones   . Sleep apnea   . Urinary incontinence     Allergies  Allergen Reactions  . Molds & Smuts Other (See Comments), Palpitations, Shortness Of Breath and Swelling  . Cefixime Swelling  . Celebrex [Celecoxib] Swelling  . Cobalt Other (See Comments)    Per allergist  . Gabapentin Other (See Comments)    Joint pain  . Levofloxacin Other (See Comments)    Muscle and joint pain  . Molybdenum Other (See Comments)    Per allergist  . Other Other (See Comments)    TEQUIN. TEQUIN - JOINT AND MUSCLE PAIN Other Reaction: painful joints TEQUIN. TEQUIN - JOINT AND MUSCLE PAIN Tantal Metal  . Penicillins Hives  . Robaxin [Methocarbamol]   . Tomato Hives  . Adhesive [Tape] Rash    Sensitive to EKG leads; sensitive to 7M Micropore tape  . Dust Mite Extract Itching, Other (See Comments) and Palpitations  . Tetracycline Hcl Rash    Can take Doxy  . Tetracyclines & Related Rash     Review of Systems:  No headache, visual changes, nausea, vomiting, diarrhea, constipation, dizziness, abdominal pain, skin rash, fevers, chills, night sweats, weight loss, swollen lymph nodes, body aches, joint swelling, chest pain, shortness of breath, mood changes. POSITIVE muscle aches, body aches  Objective  Blood pressure 140/90, pulse (!) 107, height 4\' 11"  (1.499 m), weight 226 lb (102.5 kg), last menstrual period 01/16/2002, SpO2 96 %.   General: No apparent distress alert and oriented x3  mood and affect normal, dressed appropriately.  HEENT: Pupils equal, extraocular movements intact  Respiratory: Patient's speak in full sentences and does not appear short of breath  Cardiovascular: No lower extremity edema, non tender, no erythema  Neuro: Cranial nerves II through XII are intact, neurovascularly intact in all extremities with 2+ Gait normal with good balance and coordination.  MSK:  Non tender with full range of motion and good  stability and symmetric strength and tone of shoulders, elbows, wrist, hip, knee and ankles bilaterally.  Back -back does have some mild loss of lordosis.  Some mild degenerative scoliosis. Patient's neck does have some limited sidebending and rotation.  Mild crepitus.  Lacks last 10 degrees of extension.  Negative Spurling's noted today though. Patient's left hand with mild positive Tinel's.  Arthritic changes noted.    Osteopathic findings  C2 flexed rotated and side bent left C6 flexed rotated and side bent left T3 extended rotated and side bent right inhaled rib T9 extended rotated and side bent left L2 flexed rotated and side bent right Sacrum left on left       Assessment and Plan:  Chronic pain Patient continues to have difficulty.  Seems to be more of a potential seronegative rheumatoid arthritis.  Patient was given Cymbalta at last exam but did not take it.  We attempted osteopathic manipulation but is doing more muscle energy.  Interface with the benefit of patient's response.  We did discuss active release therapy could be more beneficial if she wanted to try somebody else down in [place].  Patient has been found to also have elevated uric acid before.  Degenerative disc disease at multiple levels.  Patient will continue with the conservative therapy and follow-up again in 4 to 6 weeks.  Degenerative cervical disc Chronic but seems to be responding a little bit more to muscle energy as well as the FPR.  Once again discussed the active release therapy.  Carpal tunnel syndrome on left We will get nerve conduction study to further evaluate if more cervical radiculopathy versus carpal tunnel.  Patient has had surgical intervention on the contralateral side multiple years ago.    Nonallopathic problems  Decision today to treat with OMT was based on Physical Exam  After verbal consent patient was treated with  ME, FPR techniques in cervical, rib, thoracic, lumbar, and sacral   areas  Patient tolerated the procedure well with improvement in symptoms  Patient given exercises, stretches and lifestyle modifications  See medications in patient instructions if given  Patient will follow up in 4-8 weeks  Total time reviewing patient's chart and talking to patient 32 minutes    The above documentation has been reviewed and is accurate and complete [name], DO       Note: This dictation was prepared with Dragon dictation along with smaller phrase technology. Any transcriptional errors that result from this process are unintentional.

## 2020-02-26 ENCOUNTER — Ambulatory Visit (INDEPENDENT_AMBULATORY_CARE_PROVIDER_SITE_OTHER): Payer: Medicare Other | Admitting: Family Medicine

## 2020-02-26 ENCOUNTER — Encounter: Payer: Self-pay | Admitting: Family Medicine

## 2020-02-26 ENCOUNTER — Other Ambulatory Visit: Payer: Self-pay

## 2020-02-26 VITALS — BP 140/90 | HR 107 | Ht 59.0 in | Wt 226.0 lb

## 2020-02-26 DIAGNOSIS — G5602 Carpal tunnel syndrome, left upper limb: Secondary | ICD-10-CM

## 2020-02-26 DIAGNOSIS — M79642 Pain in left hand: Secondary | ICD-10-CM | POA: Diagnosis not present

## 2020-02-26 DIAGNOSIS — M5030 Other cervical disc degeneration, unspecified cervical region: Secondary | ICD-10-CM

## 2020-02-26 DIAGNOSIS — G894 Chronic pain syndrome: Secondary | ICD-10-CM | POA: Diagnosis not present

## 2020-02-26 DIAGNOSIS — M999 Biomechanical lesion, unspecified: Secondary | ICD-10-CM | POA: Diagnosis not present

## 2020-02-26 NOTE — Patient Instructions (Addendum)
Good to see you Muscle energy and facilitated positional release Can consider active release therapy if looking at chiropractor  Nerve conduction test they will call to schedule Lets continue to hold on cymbalta until next visit we can discuss See me again in 5 weeks

## 2020-02-26 NOTE — Assessment & Plan Note (Signed)
Chronic but seems to be responding a little bit more to muscle energy as well as the FPR.  Once again discussed the active release therapy.

## 2020-02-26 NOTE — Assessment & Plan Note (Signed)
We will get nerve conduction study to further evaluate if more cervical radiculopathy versus carpal tunnel.  Patient has had surgical intervention on the contralateral side multiple years ago.

## 2020-02-26 NOTE — Assessment & Plan Note (Signed)
Patient continues to have difficulty.  Seems to be more of a potential seronegative rheumatoid arthritis.  Patient was given Cymbalta at last exam but did not take it.  We attempted osteopathic manipulation but is doing more muscle energy.  Interface with the benefit of patient's response.  We did discuss active release therapy could be more beneficial if she wanted to try somebody else down in [place].  Patient has been found to also have elevated uric acid before.  Degenerative disc disease at multiple levels.  Patient will continue with the conservative therapy and follow-up again in 4 to 6 weeks.

## 2020-03-02 ENCOUNTER — Other Ambulatory Visit: Payer: Self-pay

## 2020-03-02 DIAGNOSIS — R202 Paresthesia of skin: Secondary | ICD-10-CM

## 2020-03-02 NOTE — Progress Notes (Signed)
mg 

## 2020-03-31 ENCOUNTER — Ambulatory Visit (INDEPENDENT_AMBULATORY_CARE_PROVIDER_SITE_OTHER): Payer: Medicare Other | Admitting: Neurology

## 2020-03-31 ENCOUNTER — Other Ambulatory Visit: Payer: Self-pay

## 2020-03-31 DIAGNOSIS — G5602 Carpal tunnel syndrome, left upper limb: Secondary | ICD-10-CM

## 2020-03-31 DIAGNOSIS — R202 Paresthesia of skin: Secondary | ICD-10-CM | POA: Diagnosis not present

## 2020-03-31 NOTE — Procedures (Signed)
[facility]  [place], [address]  [place], [place] [address] Tel: [phone] Fax:  [phone] Test Date:  03/31/2020  Patient: Kerry [name] DOB: 1950-11-05 Physician: Narda Amber, DO  Sex: Female Height: 4\' 11"  Ref Phys: Hulan Saas, DO  ID#: [id number]   Technician:    Patient Complaints: This is a 70 year old female referred for evaluation of left wrist pain and hand paresthesias.  NCV & EMG Findings: Extensive electrodiagnostic testing of the left upper extremity shows:  1. Left median sensory response shows prolonged latency (4.9 ms) and reduced amplitude (6.1 V).  Left ulnar sensory responses within normal limits. 2. Left median motor response shows prolonged latency (4.8 ms).  Left ulnar motor responses within normal limits.   3. Chronic motor axonal loss changes are seen affecting the left abductor pollicis brevis muscle, without accompanying active denervation.    Impression: 1. Left median neuropathy at or distal to the wrist, consistent with a clinical diagnosis of carpal tunnel syndrome.  Overall, these findings are moderate-to-severe in degree electrically. 2. There is no evidence of a left cervical radiculopathy.   ___________________________ Narda Amber, DO    Nerve Conduction Studies Anti Sensory Summary Table   Stim Site NR Peak (ms) Norm Peak (ms) P-T Amp (V) Norm P-T Amp  Left Median Anti Sensory (2nd Digit)  35C  Wrist    4.9 <3.8 6.1 >10  Left Ulnar Anti Sensory (5th Digit)  35C  Wrist    2.4 <3.2 23.9 >5   Motor Summary Table   Stim Site NR Onset (ms) Norm Onset (ms) O-P Amp (mV) Norm O-P Amp Site1 Site2 Delta-0 (ms) Dist (cm) Vel (m/s) Norm Vel (m/s)  Left Median Motor (Abd Poll Brev)  35C  Wrist    4.8 <4.0 6.0 >5 Elbow Wrist 4.1 25.0 61 >50  Elbow    8.9  5.9         Left Ulnar Motor (Abd Dig Minimi)  35C  Wrist    2.3 <3.1 10.1 >7 B Elbow Wrist 3.0 20.0 67 >50  B Elbow    5.3  9.4  A Elbow B Elbow 1.4 10.0 71 >50   A Elbow    6.7  9.1          EMG   Side Muscle Ins Act Fibs Psw Fasc Number Recrt Dur Dur. Amp Amp. Poly Poly. Comment  Left 1stDorInt Nml Nml Nml Nml Nml Nml Nml Nml Nml Nml Nml Nml N/A  Left Abd Poll Brev Nml Nml Nml Nml 1- Rapid Some 1+ Some 1+ Some 1+ N/A  Left PronatorTeres Nml Nml Nml Nml Nml Nml Nml Nml Nml Nml Nml Nml N/A  Left Biceps Nml Nml Nml Nml Nml Nml Nml Nml Nml Nml Nml Nml N/A  Left Triceps Nml Nml Nml Nml Nml Nml Nml Nml Nml Nml Nml Nml N/A  Left Deltoid Nml Nml Nml Nml Nml Nml Nml Nml Nml Nml Nml Nml N/A      Waveforms:

## 2020-04-07 ENCOUNTER — Encounter: Payer: Self-pay | Admitting: Family Medicine

## 2020-04-07 NOTE — Progress Notes (Unsigned)
[place] Phone: [phone] Subjective:   Kerry [name], am serving as a scribe for Dr. Hulan Saas. This visit occurred during the SARS-CoV-2 public health emergency.  Safety protocols were in place, including screening questions prior to the visit, additional usage of staff PPE, and extensive cleaning of exam room while observing appropriate contact time as indicated for disinfecting solutions.   I'm seeing this patient by the request  of:  Street, [name], MD  CC: Multiple complaints  [id number]  Kerry [name] is a 70 y.o. female coming in with complaint of back and neck pain. OMT 02/26/2020. Also f/u for L wrist pain. Patient states that she does not have pain in wrist but numbness in her fingers.   Patient notes pain in the L side of cervical spine since last appointment.   Patient states that her R SI joint has hurt worse than the L.   Out of B12. Do you want Dr. Venetia Maxon to fill as he is filling [place]ikes.  Leptin hormone  Medications patient has been prescribed: Vit B12 Taking:         Reviewed prior external information including notes and imaging from previsou exam, outside providers and external EMR if available.   As well as notes that were available from care everywhere and other healthcare systems.  Past medical history, social, surgical and family history all reviewed in electronic medical record.  [name] pertanent information unless stated regarding to the chief complaint.   Past Medical History:  Diagnosis Date  . Abnormal uterine bleeding   . Allergic rhinitis, cause unspecified   . Arthritis of knee, right   . Bursitis of left shoulder   . 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 Chronic pain   . Dyspareunia   . Elevated cholesterol   . Elevated uric acid in blood 2021  . Fibroid   . Foot fracture, left 01/2015   and torn tendons  . GERD (gastroesophageal reflux  disease)   . HTN (hypertension)   . Kidney stones   . Sleep apnea   . Urinary incontinence     Allergies  Allergen Reactions  . Molds & Smuts Other (See Comments), Palpitations, Shortness Of Breath and Swelling  . Cefixime Swelling  . Celebrex [Celecoxib] Swelling  . Cobalt Other (See Comments)    Per allergist  . Gabapentin Other (See Comments)    Joint pain  . Levofloxacin Other (See Comments)    Muscle and joint pain  . Molybdenum Other (See Comments)    Per allergist  . Other Other (See Comments)    TEQUIN. TEQUIN - JOINT AND MUSCLE PAIN Other Reaction: painful joints TEQUIN. TEQUIN - JOINT AND MUSCLE PAIN Tantal Metal  . Penicillins Hives  . Robaxin [Methocarbamol]   . Tomato Hives  . Adhesive [Tape] Rash    Sensitive to EKG leads; sensitive to 40M Micropore tape  . Dust Mite Extract Itching, Other (See Comments) and Palpitations  . Tetracycline Hcl Rash    Can take Doxy  . Tetracyclines & Related Rash     Review of Systems:  [name] headache, visual changes, nausea, vomiting, diarrhea, constipation, dizziness, abdominal pain, skin rash, fevers, chills, night sweats, weight loss, swollen lymph nodes, body aches, joint swelling, chest pain, shortness of breath, mood changes. POSITIVE muscle aches  Objective  Blood pressure (!) 148/82, pulse 87, height 4\' 11"  (1.499 m), weight 224 lb (101.6 kg), last menstrual period  01/16/2002, SpO2 97 %.   General: [name] apparent distress alert and oriented x3 mood and affect normal, dressed appropriately.  HEENT: Pupils equal, extraocular movements intact  Respiratory: Patient's speak in full sentences and does not appear short of breath  Cardiovascular: 1+ lower extremity edema, tender, [name] erythema  Severely antalgic gait patient walking with the aid of a rolling walker. Patient is low back has significant loss of lordosis.  Poor core strength.  Neurovascular intact distally though.  Upper back has more pain around the left scapular  region today.  Does have tightness of the left side of the neck.  Wrist exam on the left side shows some mild thenar eminence wasting.  Tightness of the wrist with range of motion.   Osteopathic findings  C6 flexed rotated and side bent left T5 extended rotated and side bent left inhaled rib T9 extended rotated and side bent right L2 flexed rotated and side bent right Sacrum right on right       Assessment and Plan:  Carpal tunnel syndrome on left Patient's nerve conduction study did show that patient has severe amount at this time.  Patient will be referred to a orthopedic surgeon to discuss her problem and likely go through surgical intervention.  Low back pain Continued chronic pain.  Patient still seems to be more of a seronegative arthropathy of some sort.  In the past we have discussed the possibility of Cymbalta and we need to consider again.  Patient did respond a little bit better to more muscle energy.  Hopefully this will continue.  Follow-up with me again in 5 to 6 weeks  Vitamin B 12 deficiency Patient will follow up with primary care provider and have them refill her B12 they are secondary to them checking labs more frequently.    Nonallopathic problems  Decision today to treat with OMT was based on Physical Exam  After verbal consent patient was treated with HVLA, ME, FPR techniques in cervical, rib, thoracic, lumbar, and sacral  areas  Patient tolerated the procedure well with improvement in symptoms  Patient given exercises, stretches and lifestyle modifications  See medications in patient instructions if given  Patient will follow up in 4-8 weeks      The above documentation has been reviewed and is accurate and complete Kerry Pulley, DO       Note: This dictation was prepared with Dragon dictation along with smaller phrase technology. Any transcriptional errors that result from this process are unintentional.

## 2020-04-08 ENCOUNTER — Encounter: Payer: Self-pay | Admitting: Pulmonary Disease

## 2020-04-08 ENCOUNTER — Ambulatory Visit (INDEPENDENT_AMBULATORY_CARE_PROVIDER_SITE_OTHER): Payer: Medicare Other | Admitting: Family Medicine

## 2020-04-08 ENCOUNTER — Encounter: Payer: Self-pay | Admitting: Family Medicine

## 2020-04-08 ENCOUNTER — Ambulatory Visit (INDEPENDENT_AMBULATORY_CARE_PROVIDER_SITE_OTHER): Payer: Medicare Other | Admitting: Pulmonary Disease

## 2020-04-08 ENCOUNTER — Other Ambulatory Visit: Payer: Self-pay

## 2020-04-08 VITALS — BP 180/90 | HR 85 | Temp 97.3°F | Ht 59.0 in | Wt 223.2 lb

## 2020-04-08 VITALS — BP 148/82 | HR 87 | Ht 59.0 in | Wt 224.0 lb

## 2020-04-08 DIAGNOSIS — M999 Biomechanical lesion, unspecified: Secondary | ICD-10-CM

## 2020-04-08 DIAGNOSIS — G4733 Obstructive sleep apnea (adult) (pediatric): Secondary | ICD-10-CM

## 2020-04-08 DIAGNOSIS — G8929 Other chronic pain: Secondary | ICD-10-CM | POA: Diagnosis not present

## 2020-04-08 DIAGNOSIS — M545 Low back pain, unspecified: Secondary | ICD-10-CM | POA: Diagnosis not present

## 2020-04-08 DIAGNOSIS — E538 Deficiency of other specified B group vitamins: Secondary | ICD-10-CM | POA: Diagnosis not present

## 2020-04-08 DIAGNOSIS — Z9989 Dependence on other enabling machines and devices: Secondary | ICD-10-CM

## 2020-04-08 DIAGNOSIS — G5602 Carpal tunnel syndrome, left upper limb: Secondary | ICD-10-CM | POA: Diagnosis not present

## 2020-04-08 NOTE — Assessment & Plan Note (Signed)
Continued chronic pain.  Patient still seems to be more of a seronegative arthropathy of some sort.  In the past we have discussed the possibility of Cymbalta and we need to consider again.  Patient did respond a little bit better to more muscle energy.  Hopefully this will continue.  Follow-up with me again in 5 to 6 weeks

## 2020-04-08 NOTE — Patient Instructions (Signed)
Referral to Dr. Vira Agar will call you Wrist brace Dr. Venetia Maxon can take over B12 See me in 5-6 weeks

## 2020-04-08 NOTE — Telephone Encounter (Signed)
Dr. Halford Chessman, please see mychart message sent by pt and advise. Below is a continuation of the mychart message from this encounter:   Message 2 of 2: Frayre, [place] Patient [facility]) also told me that I could not go to [facility] in [place] because I was assigned to their office in [address].   Last year Tammy said that I am due for a new CPAP machine this year. I am currently using the ResMed AirSenseT 10 AutoSetT for Her machine. Please advise if the sleep study you ordered in 2018 is the best way to get fitted for the best mask for my upper jaw situation and if you can recommend a reliable place to buy supplies, especially if I need to replace the CPAP machine. Thanks.   (For several years, I have had problems when I have ordered supplies from [facility]. I finally decided to just make do with what I have until this appt. [facility] has refused to send me the ResMed tubing I have found most effective in reducing air leaks, even though I had ordered it for several years before they merged with [employer]. Similarly, they would not send a complete Mirage FX nasal mask package, only separate components. But they sent the wrong headgear and messed up my trying to get a new mask for months. [facility] kept listing the doctor I saw 10 years ago when they billed Medicare. I have spent as long as 3 hours on hold trying to correct billing. They have billed me in error many months during the pandemic. They initially told me they could not provide a "for Her" CPAP but eventually provided one without a warranty. Something as simple as ordering CPAP supplies should not be this stressful.)    Pt is scheduled to see you today at 1:30 3/24.

## 2020-04-08 NOTE — Assessment & Plan Note (Signed)
Patient will follow up with primary care provider and have them refill her B12 they are secondary to them checking labs more frequently.

## 2020-04-08 NOTE — Progress Notes (Signed)
[facility]  Chief Complaint  Patient presents with  . Follow-up    Patient states she is due to get a new CPAP machine, feels like she is sleeping ok.     Constitutional:  BP (!) 180/90 (BP Location: Right Arm, Patient Position: Sitting, Cuff Size: Large)   Pulse 85   Temp (!) 97.3 F (36.3 C) (Temporal)   Ht 4\' 11"  (1.499 m)   Wt 223 lb 3.2 oz (101.2 kg)   LMP 01/16/2002   SpO2 97%   BMI 45.08 kg/m   Past Medical History:  Allergies, GERD, HTN, Nephrolithiasis, Chronic pain  Past Surgical History:  She  has a past surgical history that includes Umbilical hernia repair (2010); Carpal tunnel release (1998); dental implants (2010); Esophageal dilation (2010); Hammer toe surgery; Septoplasty; Rotator cuff repair (Right, 09/2011); Pelvic laparoscopy (1990);  dilatation and curettage (2000); Rotator cuff repair (Left, 11/11/13); Cataract extraction (Bilateral); and Dental surgery.  Brief Summary:  Kerry [name] is a 70 y.o. female with obstructive sleep apnea.      Subjective:   She had lots of questions regarding her CPAP therapy.  She is due for a new CPAP machine.  She is working with a Public librarian to get her dentures fitted.  She was told she might need to have her tongue frenulum released.  Physical Exam:   Appearance - well kempt   ENMT - no sinus tenderness, no oral exudate, no LAN, Mallampati 3 airway, no stridor, wears dentures  Respiratory - equal breath sounds bilaterally, no wheezing or rales  CV - s1s2 regular rate and rhythm, no murmurs  Ext - no clubbing, no edema  Skin - no rashes  Psych - normal mood and affect   Sleep Tests:   HST 03/04/15 >> AHI 21.6, SpO2 low 80%  Auto CPAP 03/08/19 to 04/06/19 >> used on 30 of 30 nights with average 5 hrs 45 min.  Average AHI 0.2 with median CPAP 8 and 95 th percentile CPAP 10 cm H2O  Cardiac Tests:   Echo 04/25/19 >> EF 60 to 65%, mild LVH, grade 2  DD  Social History:  She  reports that she has never smoked. She has never used smokeless tobacco. She reports that she does not drink alcohol and does not use drugs.  Family History:  Her family history includes Arthritis in her sister; Crohn's disease in her brother; Diabetes in her mother; Glaucoma in her brother; Heart attack in her father; Kidney failure in her brother; Ovarian cancer (age of onset: 19) in her sister; Rheumatologic disease in her mother and sister; Ulcerative colitis in her brother.     Assessment/Plan:   Obstructive sleep apnea. - she is compliant with CPAP and reports benefit from therapy - she uses [facility] for her DME - her current device is more than 72 yrs old, not functioning properly and not amenable to repair - will arrange for new Resmed Airsense 11 auto CPAP range 5 to 15 cm H2O - will have her seen in the sleep lab to get her mask refitted  Obesity. - discussed how her weight is contributing to her health issues, including her sleep apnea  Time Spent Involved in Patient Care on Day of Examination:  34 minutes  Follow up:  Patient Instructions  Will arrange for new Resmed Airsense 11 auto CPAP  Will arrange for refitting of your CPAP mask at the sleep lab  Follow up in 6 months  Medication List:   Allergies as of 04/08/2020      Reactions   Molds & Smuts Other (See Comments), Palpitations, Shortness Of Breath, Swelling   Cefixime Swelling   Celebrex [celecoxib] Swelling   [place]alt Other (See Comments)   Per allergist   Gabapentin Other (See Comments)   Joint pain   Levofloxacin Other (See Comments)   Muscle and joint pain   Molybdenum Other (See Comments)   Per allergist   Other Other (See Comments)   TEQUIN. TEQUIN - JOINT AND MUSCLE PAIN Other Reaction: painful joints TEQUIN. TEQUIN - JOINT AND MUSCLE PAIN Tantal Metal   Penicillins Hives   Robaxin [methocarbamol]    Tomato Hives   Adhesive [tape] Rash   Sensitive  to EKG leads; sensitive to 50M Micropore tape   Dust Mite Extract Itching, Other (See Comments), Palpitations   Tetracycline Hcl Rash   Can take Doxy   Tetracyclines & Related Rash      Medication List       Accurate as of April 08, 2020  5:36 PM. If you have any questions, ask your nurse or doctor.        ascorbic acid 500 MG tablet Commonly known as: VITAMIN C Take 500 mg by mouth daily.   cyanocobalamin 1000 MCG/ML injection Commonly known as: (VITAMIN B-12) INJECT 1 ML EVERY 14 DAYS AS DIRECTED What changed: See the new instructions.   Fish Oil 1000 MG Caps Take 500 mg by mouth. 1 per day   PROBIOTIC ADVANCED PO Take by mouth. Patient states taking 5 billion, one capsule daily. Suprema Dophilus probiotic   SUPER MULTI-VITAMIN PO Take 0.5 tablets by mouth daily. Solgar Formula VM-75   SYRINGE-NEEDLE (DISP) 3 ML 25G X 1" 3 ML Misc Inject B12 every 2 weeks.   TART CHERRY ADVANCED PO Take 800 mg by mouth daily.   MISC NATURAL PRODUCTS PO Take by mouth. Black currant seed oil   Vitamin D3 125 MCG (5000 UT) Caps Take 5,000 Units by mouth. Every other day   Vitamin K2 100 MCG Tabs Take 160 mcg by mouth. Every 6th day. MK-7       Signature:  Chesley Mires, MD [place] Pager - [phone] 04/08/2020, 5:36 PM

## 2020-04-08 NOTE — Patient Instructions (Signed)
Will arrange for new Resmed Airsense 11 auto CPAP  Will arrange for refitting of your CPAP mask at the sleep lab  Follow up in 6 months

## 2020-04-08 NOTE — Assessment & Plan Note (Signed)
Patient's nerve conduction study did show that patient has severe amount at this time.  Patient will be referred to a orthopedic surgeon to discuss her problem and likely go through surgical intervention.

## 2020-04-15 DIAGNOSIS — G5602 Carpal tunnel syndrome, left upper limb: Secondary | ICD-10-CM | POA: Diagnosis not present

## 2020-04-20 ENCOUNTER — Encounter: Payer: BC Managed Care – PPO | Admitting: Neurology

## 2020-04-27 ENCOUNTER — Telehealth: Payer: Self-pay | Admitting: Pulmonary Disease

## 2020-04-28 NOTE — Progress Notes (Signed)
[place] Phone: [phone] Subjective:   Kerry [name], am serving as a scribe for Dr. Hulan Saas. This visit occurred during the SARS-CoV-2 public health emergency.  Safety protocols were in place, including screening questions prior to the visit, additional usage of staff PPE, and extensive cleaning of exam room while observing appropriate contact time as indicated for disinfecting solutions.   I'm seeing this patient by the request  of:  Street, [name], MD  CC: multiple complaints   [id number]  Kerry [name] is a 70 y.o. female coming in with complaint of back and neck pain. OMT 04/08/2020.L shoulder and L wrist pain. Patient states that was in discomfort in scapula since last visit. Had booster and felt worse in shoulder, then felt better, and then felt worse again.   Unable to wear brace on L wrist due to increase in pain with forearm being larger than wrist. Surgery scheduled on May 6th.   Medications patient has been prescribed: B12  Taking:yes          Reviewed prior external information including notes and imaging from previsou exam, outside providers and external EMR if available.   As well as notes that were available from care everywhere and other healthcare systems.  Past medical history, social, surgical and family history all reviewed in electronic medical record.  [name] pertanent information unless stated regarding to the chief complaint.   Past Medical History:  Diagnosis Date  . Abnormal uterine bleeding   . Allergic rhinitis, cause unspecified   . Arthritis of knee, right   . Bursitis of left shoulder   . 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 Chronic pain   . Dyspareunia   . Elevated cholesterol   . Elevated uric acid in blood 2021  . Fibroid   . Foot fracture, left 01/2015   and torn tendons  . GERD (gastroesophageal reflux disease)   . HTN (hypertension)    . Kidney stones   . Sleep apnea   . Urinary incontinence     Allergies  Allergen Reactions  . Molds & Smuts Other (See Comments), Palpitations, Shortness Of Breath and Swelling  . Cefixime Swelling  . Celebrex [Celecoxib] Swelling  . Cobalt Other (See Comments)    Per allergist  . Gabapentin Other (See Comments)    Joint pain  . Levofloxacin Other (See Comments)    Muscle and joint pain  . Molybdenum Other (See Comments)    Per allergist  . Other Other (See Comments)    TEQUIN. TEQUIN - JOINT AND MUSCLE PAIN Other Reaction: painful joints TEQUIN. TEQUIN - JOINT AND MUSCLE PAIN Tantal Metal  . Penicillins Hives  . Robaxin [Methocarbamol]   . Tomato Hives  . Adhesive [Tape] Rash    Sensitive to EKG leads; sensitive to 37M Micropore tape  . Dust Mite Extract Itching, Other (See Comments) and Palpitations  . Tetracycline Hcl Rash    Can take Doxy  . Tetracyclines & Related Rash     Review of Systems:  [name] headache, visual changes, nausea, vomiting, diarrhea, constipation, dizziness, abdominal pain, skin rash, fevers, chills, night sweats, weight loss, swollen lymph nodes, chest pain, shortness of breath, mood changes. POSITIVE muscle aches, body aches, joint swelling   Objective  Blood pressure (!) 164/98, pulse 96, height 4\' 11"  (1.499 m), weight 222 lb (100.7 kg), last menstrual period 01/16/2002, SpO2 98 %.   General: [name] apparent distress alert  and oriented x3 mood and affect normal, dressed appropriately.  HEENT: Pupils equal, extraocular movements intact  Respiratory: Patient's speak in full sentences and does not appear short of breath  Cardiovascular: [name] lower extremity edema, non tender, [name] erythema .  Gait severly antalgic  MSK:  Significant arthritis of multple joints,   Back - Low back and mid back significant stiffness,   Osteopathic findings  C2 flexed rotated and side bent right T3 extended rotated and side bent right inhaled rib T9 extended rotated and  side bent left L2 flexed rotated and side bent right Sacrum right on right       Assessment and Plan:  Chronic pain Significant difficulty, exacerbation of chronic problem, patient has been resistant to medication changes and will be traveling for next weeks, patient is continue to watch diet, discussed avoiding doing yardwork too many days in a row. Continue vit B12  Still will consider cymbalta rtc in 6-8 weeks     Nonallopathic problems  Decision today to treat with OMT was based on Physical Exam  After verbal consent patient was treated with  ME, FPR techniques in cervical, rib, thoracic, lumbar, and sacral  Areas focused on fascia today   Patient tolerated the procedure well with improvement in symptoms  Patient given exercises, stretches and lifestyle modifications  See medications in patient instructions if given  Patient will follow up in 4-8 weeks      The above documentation has been reviewed and is accurate and complete [name], DO       Note: This dictation was prepared with Dragon dictation along with smaller phrase technology. Any transcriptional errors that result from this process are unintentional.

## 2020-04-29 ENCOUNTER — Encounter: Payer: Self-pay | Admitting: Family Medicine

## 2020-04-29 ENCOUNTER — Other Ambulatory Visit: Payer: Self-pay

## 2020-04-29 ENCOUNTER — Ambulatory Visit (INDEPENDENT_AMBULATORY_CARE_PROVIDER_SITE_OTHER): Payer: Medicare Other | Admitting: Family Medicine

## 2020-04-29 VITALS — BP 164/98 | HR 96 | Ht 59.0 in | Wt 222.0 lb

## 2020-04-29 DIAGNOSIS — M9904 Segmental and somatic dysfunction of sacral region: Secondary | ICD-10-CM | POA: Diagnosis not present

## 2020-04-29 DIAGNOSIS — M9908 Segmental and somatic dysfunction of rib cage: Secondary | ICD-10-CM | POA: Diagnosis not present

## 2020-04-29 DIAGNOSIS — M9903 Segmental and somatic dysfunction of lumbar region: Secondary | ICD-10-CM

## 2020-04-29 DIAGNOSIS — M9902 Segmental and somatic dysfunction of thoracic region: Secondary | ICD-10-CM | POA: Diagnosis not present

## 2020-04-29 DIAGNOSIS — M9901 Segmental and somatic dysfunction of cervical region: Secondary | ICD-10-CM

## 2020-04-29 DIAGNOSIS — G894 Chronic pain syndrome: Secondary | ICD-10-CM

## 2020-04-29 NOTE — Patient Instructions (Signed)
Good to see you  hyperbolt or theragun  Consider graston tool  You will have a great time for the wedding  See me again in 7-8 weeks

## 2020-04-30 ENCOUNTER — Encounter: Payer: Self-pay | Admitting: Family Medicine

## 2020-04-30 NOTE — Assessment & Plan Note (Signed)
Significant difficulty, exacerbation of chronic problem, patient has been resistant to medication changes and will be traveling for next weeks, patient is continue to watch diet, discussed avoiding doing yardwork too many days in a row. Continue vit B12  Still will consider cymbalta rtc in 6-8 weeks

## 2020-05-03 ENCOUNTER — Encounter: Payer: Self-pay | Admitting: Pulmonary Disease

## 2020-05-03 ENCOUNTER — Other Ambulatory Visit (HOSPITAL_BASED_OUTPATIENT_CLINIC_OR_DEPARTMENT_OTHER): Payer: BC Managed Care – PPO | Admitting: Pulmonary Disease

## 2020-05-03 ENCOUNTER — Telehealth: Payer: Self-pay | Admitting: Pulmonary Disease

## 2020-05-03 NOTE — Telephone Encounter (Signed)
JS do you know if the surgical clearance form has been completed and sent back?

## 2020-05-04 ENCOUNTER — Encounter: Payer: Self-pay | Admitting: Pulmonary Disease

## 2020-05-04 NOTE — Telephone Encounter (Signed)
Katharine Look calling because she faxed pre surgical clearance form, received a letter back stating notes would be sent with clearance, have not received it yet. Surgery is 05/21/20 Please advise [phone] Joylene Igo [phone]  Last office visit with Dr Halford Chessman on 04/08/2020  Dr Halford Chessman please make addendum to last office note for [place] to fax to [facility].

## 2020-05-04 NOTE — Telephone Encounter (Signed)
I have printed a letter and it is in my office.  Can be faxed to surgeon's office.

## 2020-05-04 NOTE — Telephone Encounter (Signed)
Letter has been given to [facility] for follow-up.

## 2020-05-05 NOTE — Telephone Encounter (Signed)
Faxed clearance letter to Carita Pian at [phone] -pr

## 2020-05-10 NOTE — Telephone Encounter (Signed)
Dr. Halford Chessman, Please amend last office visit with surgical clearance, we will then fax to provider needing surgical clearance.  Thank you.

## 2020-05-11 NOTE — Telephone Encounter (Signed)
See phone note from 04/27/20.  Already addressed.

## 2020-05-18 ENCOUNTER — Ambulatory Visit (HOSPITAL_BASED_OUTPATIENT_CLINIC_OR_DEPARTMENT_OTHER): Payer: Medicare Other | Attending: Pulmonary Disease | Admitting: Pulmonary Disease

## 2020-05-18 ENCOUNTER — Other Ambulatory Visit: Payer: Self-pay

## 2020-05-18 DIAGNOSIS — G4733 Obstructive sleep apnea (adult) (pediatric): Secondary | ICD-10-CM

## 2020-05-18 DIAGNOSIS — Z9989 Dependence on other enabling machines and devices: Secondary | ICD-10-CM

## 2020-05-20 ENCOUNTER — Ambulatory Visit: Payer: BC Managed Care – PPO | Admitting: Family Medicine

## 2020-05-20 NOTE — Procedures (Signed)
    Mask refitting.  Did best with Resmed airfit N20 small mask.

## 2020-05-21 DIAGNOSIS — G5602 Carpal tunnel syndrome, left upper limb: Secondary | ICD-10-CM | POA: Diagnosis not present

## 2020-05-21 HISTORY — PX: CARPAL TUNNEL RELEASE: SHX101

## 2020-06-16 NOTE — Progress Notes (Signed)
[place] Phone: [phone] Subjective:   Kerry [name], am serving as a scribe for Dr. Hulan Saas. This visit occurred during the SARS-CoV-2 public health emergency.  Safety protocols were in place, including screening questions prior to the visit, additional usage of staff PPE, and extensive cleaning of exam room while observing appropriate contact time as indicated for disinfecting solutions.   I'm seeing this patient by the request  of:  Street, [name], MD  CC: back and neck pain   [id number]  Kerry [name] is a 70 y.o. female coming in with complaint of back and neck pain. OMT 04/29/2020.  Patient states overall she has been relatively able.  Patient did have a long car trip and that seems to cause more tightness in the left upper back of the shoulder blades as well as the sacroiliac joint.  Continues to have pain since her carpal tunnel surgery.  Had carpal tunnel surgery May 21, 2020.  Patient continues to have throbbing near where patient had the IV line left hand initially had difficulty with contractures of the first and second fingers but is improving slowly. Does feel like tightness in L trap improved post surgery for L carpal tunnel. Does note some tightness in L trap recently.   Medications patient has been prescribed: None           Reviewed prior external information including notes and imaging from previsou exam, outside providers and external EMR if available.   As well as notes that were available from care everywhere and other healthcare systems.  Past medical history, social, surgical and family history all reviewed in electronic medical record.  [name] pertanent information unless stated regarding to the chief complaint.   Past Medical History:  Diagnosis Date  . Abnormal uterine bleeding   . Allergic rhinitis, cause unspecified   . Arthritis of knee, right   .  Bursitis of left shoulder   . 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 Chronic pain   . Dyspareunia   . Elevated cholesterol   . Elevated uric acid in blood 2021  . Fibroid   . Foot fracture, left 01/2015   and torn tendons  . GERD (gastroesophageal reflux disease)   . HTN (hypertension)   . Kidney stones   . Sleep apnea   . Urinary incontinence     Allergies  Allergen Reactions  . Molds & Smuts Other (See Comments), Palpitations, Shortness Of Breath and Swelling  . Cefixime Swelling  . Celebrex [Celecoxib] Swelling  . Cobalt Other (See Comments)    Per allergist  . Gabapentin Other (See Comments)    Joint pain  . Levofloxacin Other (See Comments)    Muscle and joint pain  . Molybdenum Other (See Comments)    Per allergist  . Other Other (See Comments)    TEQUIN. TEQUIN - JOINT AND MUSCLE PAIN Other Reaction: painful joints TEQUIN. TEQUIN - JOINT AND MUSCLE PAIN Tantal Metal  . Penicillins Hives  . Robaxin [Methocarbamol]   . Tomato Hives  . Adhesive [Tape] Rash    Sensitive to EKG leads; sensitive to 31M Micropore tape  . Dust Mite Extract Itching, Other (See Comments) and Palpitations  . Tetracycline Hcl Rash    Can take Doxy  . Tetracyclines & Related Rash     Review of Systems:  [name] headache, visual changes, nausea, vomiting, diarrhea, constipation, dizziness, abdominal pain, skin rash, fevers, chills, night  sweats, weight loss, swollen lymph nodes, body aches, joint swelling, chest pain, shortness of breath, mood changes. POSITIVE muscle aches  Objective  Blood pressure (!) 148/86, pulse 96, height 4\' 11"  (1.499 m), weight 217 lb (98.4 kg), last menstrual period 01/16/2002, SpO2 98 %.   General: [name] apparent distress alert and oriented x3 mood and affect normal, dressed appropriately. Overweight  HEENT: Pupils equal, extraocular movements intact  Respiratory: Patient's speak in full sentences and does not appear short of breath  Cardiovascular: [name] lower extremity  edema, non tender, [name] erythema  Antalgic gait. Continued pain noted in the upper parascapular regions bilaterally.  Some limited sidebending of the neck bilaterally.  Patient does have tightness in the paraspinal musculature of the lumbar spine.  Osteopathic findings  C2 flexed rotated and side bent right T3 extended rotated and side bent right inhaled rib T9 extended rotated and side bent left L2 flexed rotated and side bent right Sacrum right on right       Assessment and Plan:  Low back pain Continued problem.  Patient continues to have the polyarthralgia.  Did discuss the possibility of Cymbalta again but will hold at this point.  Recently did have a carpal tunnel surgery.  Patient did not want to add any other variables at this moment.  We will increase activity slowly.  Follow-up with me again in 6 weeks  Carpal tunnel syndrome on left Hand surgery within the last month improving slowly.    Nonallopathic problems  Decision today to treat with OMT was based on Physical Exam  After verbal consent patient was treated with  ME, FPR techniques in cervical, rib, thoracic, lumbar, and sacral  areas  Patient tolerated the procedure well with improvement in symptoms  Patient given exercises, stretches and lifestyle modifications  See medications in patient instructions if given  Patient will follow up in 4-8 weeks      The above documentation has been reviewed and is accurate and complete [name], DO       Note: This dictation was prepared with Dragon dictation along with smaller phrase technology. Any transcriptional errors that result from this process are unintentional.

## 2020-06-17 ENCOUNTER — Encounter: Payer: Self-pay | Admitting: Family Medicine

## 2020-06-17 ENCOUNTER — Ambulatory Visit (INDEPENDENT_AMBULATORY_CARE_PROVIDER_SITE_OTHER): Payer: Medicare Other | Admitting: Family Medicine

## 2020-06-17 ENCOUNTER — Other Ambulatory Visit: Payer: Self-pay

## 2020-06-17 VITALS — BP 148/86 | HR 96 | Ht 59.0 in | Wt 217.0 lb

## 2020-06-17 DIAGNOSIS — M9902 Segmental and somatic dysfunction of thoracic region: Secondary | ICD-10-CM | POA: Diagnosis not present

## 2020-06-17 DIAGNOSIS — M2550 Pain in unspecified joint: Secondary | ICD-10-CM

## 2020-06-17 DIAGNOSIS — M9901 Segmental and somatic dysfunction of cervical region: Secondary | ICD-10-CM

## 2020-06-17 DIAGNOSIS — M545 Low back pain, unspecified: Secondary | ICD-10-CM

## 2020-06-17 DIAGNOSIS — G5602 Carpal tunnel syndrome, left upper limb: Secondary | ICD-10-CM

## 2020-06-17 DIAGNOSIS — M9904 Segmental and somatic dysfunction of sacral region: Secondary | ICD-10-CM

## 2020-06-17 DIAGNOSIS — G8929 Other chronic pain: Secondary | ICD-10-CM | POA: Diagnosis not present

## 2020-06-17 DIAGNOSIS — M9903 Segmental and somatic dysfunction of lumbar region: Secondary | ICD-10-CM

## 2020-06-17 DIAGNOSIS — M9908 Segmental and somatic dysfunction of rib cage: Secondary | ICD-10-CM | POA: Diagnosis not present

## 2020-06-17 NOTE — Patient Instructions (Signed)
Let's talk about Cymbalta See me in 6 weeks

## 2020-06-17 NOTE — Assessment & Plan Note (Signed)
Continued problem.  Patient continues to have the polyarthralgia.  Did discuss the possibility of Cymbalta again but will hold at this point.  Recently did have a carpal tunnel surgery.  Patient did not want to add any other variables at this moment.  We will increase activity slowly.  Follow-up with me again in 6 weeks

## 2020-06-17 NOTE — Assessment & Plan Note (Signed)
Hand surgery within the last month improving slowly.

## 2020-07-06 ENCOUNTER — Ambulatory Visit (INDEPENDENT_AMBULATORY_CARE_PROVIDER_SITE_OTHER): Payer: Medicare Other | Admitting: Family Medicine

## 2020-07-06 ENCOUNTER — Encounter: Payer: Self-pay | Admitting: Family Medicine

## 2020-07-06 ENCOUNTER — Other Ambulatory Visit: Payer: Self-pay

## 2020-07-06 VITALS — BP 140/82 | HR 98 | Ht 59.0 in | Wt 215.0 lb

## 2020-07-06 DIAGNOSIS — M9903 Segmental and somatic dysfunction of lumbar region: Secondary | ICD-10-CM

## 2020-07-06 DIAGNOSIS — M9901 Segmental and somatic dysfunction of cervical region: Secondary | ICD-10-CM | POA: Diagnosis not present

## 2020-07-06 DIAGNOSIS — M9904 Segmental and somatic dysfunction of sacral region: Secondary | ICD-10-CM | POA: Diagnosis not present

## 2020-07-06 DIAGNOSIS — M9902 Segmental and somatic dysfunction of thoracic region: Secondary | ICD-10-CM

## 2020-07-06 DIAGNOSIS — M5416 Radiculopathy, lumbar region: Secondary | ICD-10-CM | POA: Diagnosis not present

## 2020-07-06 DIAGNOSIS — M9908 Segmental and somatic dysfunction of rib cage: Secondary | ICD-10-CM | POA: Diagnosis not present

## 2020-07-06 MED ORDER — DULOXETINE HCL 20 MG PO CPEP: 20.0000 mg | ORAL_CAPSULE | Freq: Every day | ORAL | 0 refills | Status: DC

## 2020-07-06 NOTE — Assessment & Plan Note (Signed)
Patient does have some worsening lumbar radiculopathy.  Started on antibiotic.  Has had difficulty with multiple different medications but we will see how patient responds.  Is having worsening pain.  Attempted osteopathic manipulation with varying degrees of success.  Focus more on muscle energy secondary to patient having more splinting.  Follow-up again 6 to 8 weeks

## 2020-07-06 NOTE — Progress Notes (Signed)
[place] Phone: [phone] Subjective:   [name], am serving as a scribe for Dr. Hulan Saas.  This visit occurred during the SARS-CoV-2 public health emergency.  Safety protocols were in place, including screening questions prior to the visit, additional usage of staff PPE, and extensive cleaning of exam room while observing appropriate contact time as indicated for disinfecting solutions.    I'm seeing this patient by the request  of:  Street, [name], MD  CC: Neck pain follow-up  [id number]  Kerry [name] is a 70 y.o. female coming in with complaint of back and neck pain. OMT 06/17/2020. Patient states that she has been having L sided SI joint pain since last visit. Has been taking care of granddaughter. Pain was radiating down to her L foot. This has subsided. Pain more localized to L hip. Using ice and heat for pain relief.  Denies any dysuria, any polyuria.  States that he just feels more like a muscle with radicular symptoms.  Medications patient has been prescribed: B12  Taking: Yes         Past Medical History:  Diagnosis Date   Abnormal uterine bleeding    Allergic rhinitis, cause unspecified    Arthritis of knee, right    Bursitis of left shoulder    Cherry hemangioma 07/28/15   vulva   Chronic pain    Dyspareunia    Elevated cholesterol    Elevated uric acid in blood 2021   Fibroid    Foot fracture, left 01/2015   and torn tendons   GERD (gastroesophageal reflux disease)    HTN (hypertension)    Kidney stones    Sleep apnea    Urinary incontinence     Allergies  Allergen Reactions   Molds & Smuts Other (See Comments), Palpitations, Shortness Of Breath and Swelling   Cefixime Swelling   Celebrex [Celecoxib] Swelling   Cobalt Other (See Comments)    Per allergist   Gabapentin Other (See Comments)    Joint pain   Levofloxacin Other (See Comments)     Muscle and joint pain   Molybdenum Other (See Comments)    Per allergist   Other Other (See Comments)    TEQUIN. TEQUIN - JOINT AND MUSCLE PAIN Other Reaction: painful joints TEQUIN. TEQUIN - JOINT AND MUSCLE PAIN Tantal Metal   Penicillins Hives   Robaxin [Methocarbamol]    Tomato Hives   Adhesive [Tape] Rash    Sensitive to EKG leads; sensitive to 54M Micropore tape   Dust Mite Extract Itching, Other (See Comments) and Palpitations   Tetracycline Hcl Rash    Can take Doxy   Tetracyclines & Related Rash     Review of Systems:  No headache, visual changes, nausea, vomiting, diarrhea, constipation, dizziness, abdominal pain, skin rash, fevers, chills, night sweats, weight loss, swollen lymph nodes,  joint swelling, chest pain, shortness of breath, mood changes. POSITIVE muscle aches, body aches  Objective  Blood pressure 140/82, pulse 98, height 4\' 11"  (1.499 m), weight 215 lb (97.5 kg), last menstrual period 01/16/2002, SpO2 97 %.   General: No apparent distress alert and oriented x3 mood and affect normal, dressed appropriately.  HEENT: Pupils equal, extraocular movements intact  Respiratory: Patient's speak in full sentences and does not appear short of breath  Cardiovascular: Trace lower extremity edema, non tender, no erythema  Low back pain noted with tenderness to palpation in the paraspinal musculature throughout  the entire lumbar spine.  On the left side.  Tightness of the left straight leg test With mild radicular symptoms at 25 degrees of forward flexion.  Seems to have the full strength noted. Neck exam does have significant tightness noted as well with mild crepitus.  Osteopathic findings  C2 flexed rotated and side bent right T3 extended rotated and side bent right inhaled rib T9 extended rotated and side bent left L1 flexed rotated and side bent right Sacrum right on right       Assessment and Plan: Left lumbar radiculopathy Patient does have some  worsening lumbar radiculopathy.  Started on antibiotic.  Has had difficulty with multiple different medications but we will see how patient responds.  Is having worsening pain.  Attempted osteopathic manipulation with varying degrees of success.  Focus more on muscle energy secondary to patient having more splinting.  Follow-up again 6 to 8 weeks     Nonallopathic problems  Decision today to treat with OMT was based on Physical Exam  After verbal consent patient was treated with  ME, FPR techniques in cervical, rib, thoracic, lumbar, and sacral  areas  Patient tolerated the procedure well with improvement in symptoms  Patient given exercises, stretches and lifestyle modifications  See medications in patient instructions if given  Patient will follow up in 4-8 weeks     The above documentation has been reviewed and is accurate and complete [name], DO        Note: This dictation was prepared with Dragon dictation along with smaller phrase technology. Any transcriptional errors that result from this process are unintentional.

## 2020-07-06 NOTE — Patient Instructions (Addendum)
Cymbalta 20 mg See me before going to DC and then schedule one visit 2 weeks after

## 2020-07-20 ENCOUNTER — Encounter: Payer: Self-pay | Admitting: Obstetrics and Gynecology

## 2020-07-20 NOTE — Telephone Encounter (Signed)
Hello Dr. Halford Chessman, please advise on mychart message. Thank you!  On May 3, I had a mask fitting at the [facility]. A few days later I had surgery, delaying this response. I was told to advise you if the pressure I felt across my face did not subside and that a softer version of my ResMed AirFit N20 nasal CPAP mask might be available.   The pressure is still noticeable across my cheeks and across the bridge of my nose, no matter how I have adjusted the straps. While the cushion I have seems to be the best size vertically, I wonder if it is too narrow across the base of my nose.   I see online that there is the ResMed AirFit N20 that I have, a ResMed AirFit N20 for Her, a ResMed AirTouch N20, and a ResMed AirTouch N20 for Her, but I do not understand the differences among them.   Prior to receiving the AirFit N20, I had worn an [employer] FX for Her mask for years. Likewise, I am not sure if there is a difference between the [facility] FX for Her and the [employer].   Unfortunately, in early April the wireless feature of my CPAP machine was disabled (a contract ended between [employer] Patient and ResMed). This was unfortunate because the ResMed website provides more detailed information that would have helped me to evaluate the new masks. I am still on a wait list for a new CPAP machine.   Please advise if I need a followup appt for the mask fitting.   If I need to pick up anything from the [facility], I will be in [place] on July 7, July 19, Aug 1, and Aug 9. Thanks for your help.

## 2020-07-22 ENCOUNTER — Ambulatory Visit (INDEPENDENT_AMBULATORY_CARE_PROVIDER_SITE_OTHER): Payer: Medicare Other

## 2020-07-22 ENCOUNTER — Ambulatory Visit (INDEPENDENT_AMBULATORY_CARE_PROVIDER_SITE_OTHER): Payer: Medicare Other | Admitting: Obstetrics and Gynecology

## 2020-07-22 ENCOUNTER — Other Ambulatory Visit: Payer: Self-pay | Admitting: Obstetrics and Gynecology

## 2020-07-22 ENCOUNTER — Other Ambulatory Visit: Payer: Self-pay

## 2020-07-22 ENCOUNTER — Encounter: Payer: Self-pay | Admitting: Obstetrics and Gynecology

## 2020-07-22 VITALS — BP 142/76 | HR 96

## 2020-07-22 DIAGNOSIS — H35373 Puckering of macula, bilateral: Secondary | ICD-10-CM | POA: Diagnosis not present

## 2020-07-22 DIAGNOSIS — D219 Benign neoplasm of connective and other soft tissue, unspecified: Secondary | ICD-10-CM

## 2020-07-22 DIAGNOSIS — H5315 Visual distortions of shape and size: Secondary | ICD-10-CM | POA: Diagnosis not present

## 2020-07-22 DIAGNOSIS — Z8041 Family history of malignant neoplasm of ovary: Secondary | ICD-10-CM | POA: Diagnosis not present

## 2020-07-22 DIAGNOSIS — H35363 Drusen (degenerative) of macula, bilateral: Secondary | ICD-10-CM | POA: Diagnosis not present

## 2020-07-22 DIAGNOSIS — Z961 Presence of intraocular lens: Secondary | ICD-10-CM | POA: Diagnosis not present

## 2020-07-22 DIAGNOSIS — Z1231 Encounter for screening mammogram for malignant neoplasm of breast: Secondary | ICD-10-CM

## 2020-07-22 NOTE — Progress Notes (Signed)
GYNECOLOGY  VISIT   HPI: 70 y.o.   Married  Caucasian  female   G2P2002 with Patient's last menstrual period was 01/16/2002.   here for pelvic ultrasound for family history of ovarian cancer.  Pelvic US 04/10/19: Uterus with multiple fibroids, including a cervical fibroid, 23 mm.  Fibroids are stable. EMS 3.36 mm. Ovaries normal. No free fluid.   No postmenopausal bleeding.  Continued abdominal pain, which she attributes to her dietary choices.   GYNECOLOGIC HISTORY: Patient's last menstrual period was 01/16/2002. Contraception: PMP Menopausal hormone therapy:  none Last mammogram: 08-05-19 3D/Neg/density C/Birads1 Last pap smear:  08-07-18 Neg, 07-28-16 Neg, 06-24-14 Neg        OB History     Gravida  2   Para  2   Term  2   Preterm      AB      Living  2      SAB      IAB      Ectopic      Multiple      Live Births                 Patient Active Problem List   Diagnosis Date Noted   Degenerative cervical disc 04/10/2019   Carpal tunnel syndrome on left 02/27/2019   Left lumbar radiculopathy 12/28/2017   Nonallopathic lesion of thoracic region 08/02/2017   Swelling of both lower extremities 01/18/2017   Nonallopathic lesion of sacral region 01/03/2017   Allergic contact dermatitis due to metals 12/16/2016   Abnormal gait 04/10/2016   Dyslipidemia 08/12/2015   Dyspnea on exertion 08/12/2015   Palpitations 08/12/2015   Pes anserine bursitis 08/10/2015   Rupture of left posterior tibialis tendon 03/25/2015   OSA (obstructive sleep apnea) 03/05/2015   Navicular fracture of ankle 01/21/2015   Sinus infection 12/25/2014   Uric acid arthropathy 03/23/2014   High plasma oxalate 02/17/2014   Nonallopathic lesion of cervical region 12/19/2012   Fibroids 05/28/2012   Family history of ovarian cancer 05/28/2012   Strain of quadriceps muscle 04/17/2012   Nonallopathic lesion of costovertebral region 01/23/2012   Left shoulder pain 12/20/2011   Rotator  cuff disorder 09/26/2011   Bilateral foot pain 09/20/2011   Arthralgia of multiple joints 07/26/2011   Arthritis of knee, degenerative 07/26/2011   Arthritis of hand, degenerative 07/26/2011   Low back pain 07/26/2011   Vitamin B 12 deficiency 03/05/2011   TMJ (temporomandibular joint syndrome) 03/05/2011   Pes planus 03/01/2011   Right knee pain 03/01/2011   Greater trochanteric bursitis of left hip 03/01/2011   Nonallopathic lesion of lumbosacral region 01/27/2011   Obesity 12/29/2010   Fatigue 12/29/2010   Allergic rhinitis, cause unspecified    Chronic pain    GERD (gastroesophageal reflux disease)    Essential hypertension    Sleep apnea     Past Medical History:  Diagnosis Date   Abnormal uterine bleeding    Allergic rhinitis, cause unspecified    Arthritis of knee, right    Bursitis of left shoulder    Cherry hemangioma 07/28/15   vulva   Chronic pain    Dyspareunia    Elevated cholesterol    Elevated uric acid in blood 2021   Fibroid    Foot fracture, left 01/2015   and torn tendons   GERD (gastroesophageal reflux disease)    HTN (hypertension)    Kidney stones    Sleep apnea    Urinary incontinence     Past  Surgical History:  Procedure Laterality Date    dilatation and curettage  2000   CARPAL TUNNEL RELEASE  1998   CATARACT EXTRACTION Bilateral    dental implants  2010   DENTAL SURGERY     ESOPHAGEAL DILATION  2010   HAMMER TOE SURGERY     rt   PELVIC LAPAROSCOPY  1990   ROTATOR CUFF REPAIR Right 09/2011   Dr. Tamera Punt   ROTATOR CUFF REPAIR Left 11/11/13   Dr. Tamera Punt    SEPTOPLASTY     UMBILICAL HERNIA REPAIR  2010    Current Outpatient Medications  Medication Sig Dispense Refill   ascorbic acid (VITAMIN C) 500 MG tablet Take 500 mg by mouth daily.     Cholecalciferol (VITAMIN D3) 5000 units CAPS Take 5,000 Units by mouth. Every other day     Menatetrenone (VITAMIN K2) 100 MCG TABS Take 160 mcg by mouth. Every 6th day. MK-7     Misc Natural  Products (TART CHERRY ADVANCED PO) Take 800 mg by mouth daily.     MISC NATURAL PRODUCTS PO Take by mouth. Black currant seed oil     Multiple Vitamins-Minerals (SUPER MULTI-VITAMIN PO) Take 0.5 tablets by mouth daily. Solgar Formula VM-75     Omega-3 Fatty Acids (FISH OIL) 1000 MG CAPS Take 500 mg by mouth. 1 per day     Probiotic Product (PROBIOTIC ADVANCED PO) Take by mouth. Patient states taking 5 billion, one capsule daily. Suprema Dophilus probiotic     SYRINGE-NEEDLE, DISP, 3 ML 25G X 1" 3 ML MISC Inject B12 every 2 weeks. 50 each 0   DULoxetine (CYMBALTA) 20 MG capsule Take 1 capsule (20 mg total) by mouth daily. (Patient not taking: Reported on 07/22/2020) 30 capsule 0   No current facility-administered medications for this visit.     ALLERGIES: Molds & smuts, Cefixime, Celebrex [celecoxib], Cobalt, Gabapentin, Levofloxacin, Molybdenum, Other, Penicillins, Robaxin [methocarbamol], Tomato, Adhesive [tape], Dust mite extract, Tetracycline hcl, and Tetracyclines & related  Family History  Problem Relation Age of Onset   Rheumatologic disease Mother    Diabetes Mother    Heart attack Father    Rheumatologic disease Sister    Arthritis Sister        --Rheumatoid arthritis   Ovarian cancer Sister 69   Crohn's disease Brother    Kidney failure Brother    Ulcerative colitis Brother    Glaucoma Brother     Social History   Socioeconomic History   Marital status: Married    Spouse name: Not on file   Number of children: Not on file   Years of education: Not on file   Highest education level: Not on file  Occupational History   Occupation: Retired  Tobacco Use   Smoking status: Never   Smokeless tobacco: Never  Vaping Use   Vaping Use: Never used  Substance and Sexual Activity   Alcohol use: No    Alcohol/week: 0.0 standard drinks   Drug use: No   Sexual activity: Not Currently    Partners: Male    Birth control/protection: Post-menopausal  Other Topics Concern   Not on  file  Social History Narrative   Not on file   Social Determinants of Health   Financial Resource Strain: Not on file  Food Insecurity: Not on file  Transportation Needs: Not on file  Physical Activity: Not on file  Stress: Not on file  Social Connections: Not on file  Intimate Partner Violence: Not on file  Review of Systems  All other systems reviewed and are negative.  PHYSICAL EXAMINATION:    BP (!) 142/76 (Cuff Size: Large)   Pulse 96   LMP 01/16/2002   SpO2 99%     General appearance: alert, cooperative and appears stated age  Pelvic US Uterus with fibroids 22 mm, 10 mm, and 9 mm. Adjacent to endometrium with cystic degeneration.  Previously measured fibroid at cervix, cannot rule out nabothian cyst with internal echoes versus fibroid.  No change from previous.  EMS. 3.73 mm.  Ovaries normal.  No free fluid.   ASSESSMENT  FH ovarian cancer.  Fibroids.  Possible cervical fibroid versus nabothian cyst.   PLAN  Pelvic US findings reviewed and reassurance given.  Prior US images reviewed as well from 2021. Annual exam in August.

## 2020-07-30 DIAGNOSIS — Z9989 Dependence on other enabling machines and devices: Secondary | ICD-10-CM

## 2020-07-30 DIAGNOSIS — G4733 Obstructive sleep apnea (adult) (pediatric): Secondary | ICD-10-CM

## 2020-07-30 NOTE — Telephone Encounter (Signed)
Notified pt on Dr. Juanetta Gosling recommendations via My Chart. Nothing further needed at this time.

## 2020-07-30 NOTE — Telephone Encounter (Signed)
Dr. Halford Chessman please advise on the follow My Chart message:   Thanks for your reply, but I don't know what will work. It is hard to choose a mask without trying it on.  I did not try "AirTouch" or "for Her" for the ResMed N20, only the AirFit at the May 3 [facility] mask fitting. Since receiving your message, I have spent hours looking at mask differences, reading reviews, and watching YouTube videos.   Right now, my preferences are to:  1. Order one or more of the AirTouch size small cushions to use with the headgear I have. Why was this not an option at the mask fitting?   2. Order a mask kit that contains everything for the [facility] FX for Her Nasal Mask with Headgear, [phone], size small so I don't have to worry about another botched order for headgear.   3. Write a prescription for [employer] tubing, [id number].   Explanations below:  I had hoped the mask fitting appt would identify something newer than the [employer] FX for Her that I have worn for years.  It has been a good mask for me but I had thought there might be something better after the 2019 surgeries that removed my dental implants. I am allergic to metals and have bone damage from the dental work.   Using my May 3 ResMed AirFit N20 nasal mask with the silicone cushion leaves a deep indentation on the bridge of my nose and is tight across my cheeks and the bottom of my nose. The bottom of my nose is slightly wider than the size small on the fitting guide I found online, but the distance between my eyes is lessthan the approx 1.5" width.   Thank you

## 2020-07-30 NOTE — Telephone Encounter (Signed)
Dr. Halford Chessman please advise on following My Chart message:   The user guide for my AirFit N20 makes no mention of the AirTouch--I thought they were 2 different masks.  Tonight I read an online user manual. It states that both use the same headgear and the cushions for the AirFit and the AirTouch are interchangeable. I do not know if the "for Her" headgear would be a better fit.   For years I have had multiple appts with the local [facility] office to identify a newer mask, but their inventory and appt slots have been so limited that all attempts have failed within the 30-day time limit. Since the pandemic I have not been comfortable trying on a mask in their small, poorly ventilated space. I had asked if I could go to [facility] in [place] where the inventory might be larger but was told "no", that I was assigned to the [facility] office.   I have not had new headgear for my old [employer]le[employer] Mer[employer] FX for Her for 2 years because [facility] sent the wrong item and failed to credit the return. They will not send the ResMed tubing I ask for. I last ordered it in 2021 with instructions not to send anything if they could not send the ResMed. They sent the cheaper tubing anyway that tugs at my mask. They have billed me repeatedly for erroneous balances & could not explain what the charges were for.   [employer] was hacked Sept 2021, and I was notified last month to be vigilant for identity theft. I am beyond exasperated. I have several other medical, dental, and personal issues that need attention. I cannot keep spending this much timeon CPAP supplies. Thanks for your help!   FYI this is part 2 of the My Chart sent to you earlier. Thank you

## 2020-08-02 NOTE — Progress Notes (Signed)
[place] [address] [place] Phone: [phone] Subjective:   I [name] am serving as a Education administrator for Dr. Hulan Saas.  This visit occurred during the SARS-CoV-2 public health emergency.  Safety protocols were in place, including screening questions prior to the visit, additional usage of staff PPE, and extensive cleaning of exam room while observing appropriate contact time as indicated for disinfecting solutions.   I'm seeing this patient by the request  of:  Street, [name], MD  CC: Back pain and body pain follow-up  [id number]  Kerry [name] is a 70 y.o. female coming in with complaint of back and neck pain. OMT 07/06/2020.  Patient at last follow-up was to start Cymbalta.  Patient states she has been a little better. States she has had some intense pain on the upper left side. Has some pain in the left hand after carpal tunnel release and believes it may be contributing to left sided pain.   Medications patient has been prescribed: Cymbalta  Taking: Patient is not taking the Cymbalta.         Reviewed prior external information including notes and imaging from previsou exam, outside providers and external EMR if available.   As well as notes that were available from care everywhere and other healthcare systems.  Past medical history, social, surgical and family history all reviewed in electronic medical record.  No pertanent information unless stated regarding to the chief complaint.   Past Medical History:  Diagnosis Date   Abnormal uterine bleeding    Allergic rhinitis, cause unspecified    Arthritis of knee, right    Bursitis of left shoulder    Cherry hemangioma 07/28/15   vulva   Chronic pain    Dyspareunia    Elevated cholesterol    Elevated uric acid in blood 2021   Fibroid    Foot fracture, left 01/2015   and torn tendons   GERD (gastroesophageal reflux disease)    HTN  (hypertension)    Kidney stones    Sleep apnea    Urinary incontinence     Allergies  Allergen Reactions   Molds & Smuts Other (See Comments), Palpitations, Shortness Of Breath and Swelling   Cefixime Swelling   Celebrex [Celecoxib] Swelling   Cobalt Other (See Comments)    Per allergist   Gabapentin Other (See Comments)    Joint pain   Levofloxacin Other (See Comments)    Muscle and joint pain   Molybdenum Other (See Comments)    Per allergist   Other Other (See Comments)    TEQUIN. TEQUIN - JOINT AND MUSCLE PAIN Other Reaction: painful joints TEQUIN. TEQUIN - JOINT AND MUSCLE PAIN Tantal Metal   Penicillins Hives   Robaxin [Methocarbamol]    Tomato Hives   Adhesive [Tape] Rash    Sensitive to EKG leads; sensitive to 27M Micropore tape   Dust Mite Extract Itching, Other (See Comments) and Palpitations   Tetracycline Hcl Rash    Can take Doxy   Tetracyclines & Related Rash     Review of Systems:  No headache, visual changes, nausea, vomiting, diarrhea, constipation, dizziness, abdominal pain, skin rash, fevers, chills, night sweats, weight loss, swollen lymph nodes, chest pain, shortness of breath, mood changes. POSITIVE muscle aches, body aches and joint pain intermittent joint swelling  Objective  Blood pressure 130/82, pulse 89, height 4\' 11"  (1.499 m), weight 215 lb (97.5 kg), last menstrual period 01/16/2002, SpO2 97 %.  General: No apparent distress alert and oriented x3 mood and affect normal, dressed appropriately.  HEENT: Pupils equal, extraocular movements intact  Respiratory: Patient's speak in full sentences and does not appear short of breath  Cardiovascular: No lower extremity edema, non tender, no erythema  Low back exam does have some loss of lordosis.  Patient does have poor core strength noted.  Patient does have tenderness to palpation in the paraspinal musculature and tightness with FABER test and straight leg test but no true radicular symptoms.   Patient has multiple areas of tenderness to light palpation in the paraspinal musculature from the cervical to the lumbar area.  Limited range of motion of the neck in multiple planes  Osteopathic findings  C6 flexed rotated and side bent left T3 extended rotated and side bent right inhaled rib T11 extended rotated and side bent left L2 flexed rotated and side bent right L5 flexed rotated and side bent left Sacrum right on right       Assessment and Plan: Degenerative cervical disc Continues to have chronic pain overall.  Discussed icing regimen and home exercises.  Discussed which activities to do which wants to avoid.  Increase activity slowly.  Discussed icing regimen.  Once again do think patient would respond well to the Cymbalta.  Patient will consider doing this when she gets back into town.  Follow-up with me again in 6 weeks no other significant changes.    Nonallopathic problems  Decision today to treat with OMT was based on Physical Exam  After verbal consent patient was treated with , ME for lumbar spine and thoracic, FPR cervical techniques in cervical, rib, thoracic, lumbar, and sacral  areas  Patient tolerated the procedure well with improvement in symptoms  Patient given exercises, stretches and lifestyle modifications  See medications in patient instructions if given  Patient will follow up in 4-8 weeks      The above documentation has been reviewed and is accurate and complete [name], DO        Note: This dictation was prepared with Dragon dictation along with smaller phrase technology. Any transcriptional errors that result from this process are unintentional.

## 2020-08-03 ENCOUNTER — Ambulatory Visit (INDEPENDENT_AMBULATORY_CARE_PROVIDER_SITE_OTHER): Payer: Medicare Other | Admitting: Family Medicine

## 2020-08-03 ENCOUNTER — Encounter: Payer: Self-pay | Admitting: Family Medicine

## 2020-08-03 ENCOUNTER — Other Ambulatory Visit: Payer: Self-pay

## 2020-08-03 VITALS — BP 130/82 | HR 89 | Ht 59.0 in | Wt 215.0 lb

## 2020-08-03 DIAGNOSIS — M9901 Segmental and somatic dysfunction of cervical region: Secondary | ICD-10-CM

## 2020-08-03 DIAGNOSIS — M542 Cervicalgia: Secondary | ICD-10-CM | POA: Diagnosis not present

## 2020-08-03 DIAGNOSIS — M9902 Segmental and somatic dysfunction of thoracic region: Secondary | ICD-10-CM

## 2020-08-03 DIAGNOSIS — G8929 Other chronic pain: Secondary | ICD-10-CM | POA: Diagnosis not present

## 2020-08-03 DIAGNOSIS — M9908 Segmental and somatic dysfunction of rib cage: Secondary | ICD-10-CM

## 2020-08-03 DIAGNOSIS — M9904 Segmental and somatic dysfunction of sacral region: Secondary | ICD-10-CM

## 2020-08-03 DIAGNOSIS — M545 Low back pain, unspecified: Secondary | ICD-10-CM | POA: Diagnosis not present

## 2020-08-03 DIAGNOSIS — M5030 Other cervical disc degeneration, unspecified cervical region: Secondary | ICD-10-CM | POA: Diagnosis not present

## 2020-08-03 DIAGNOSIS — M9903 Segmental and somatic dysfunction of lumbar region: Secondary | ICD-10-CM | POA: Diagnosis not present

## 2020-08-03 NOTE — Patient Instructions (Addendum)
Good to see you Travel safe [place] tool could be helpful  Referral PT with Baird Lyons See me again in 6 weeks

## 2020-08-04 ENCOUNTER — Encounter: Payer: Self-pay | Admitting: Family Medicine

## 2020-08-04 NOTE — Assessment & Plan Note (Signed)
Continues to have chronic pain overall.  Discussed icing regimen and home exercises.  Discussed which activities to do which wants to avoid.  Increase activity slowly.  Discussed icing regimen.  Once again do think patient would respond well to the Cymbalta.  Patient will consider doing this when she gets back into town.  Follow-up with me again in 6 weeks no other significant changes.

## 2020-08-09 NOTE — Progress Notes (Deleted)
GYNECOLOGY  VISIT   HPI: 70 y.o.   Married  Caucasian  female   G2P2002 with Patient's last menstrual period was 01/16/2002.   here for Breast and pelvic exam.  GYNECOLOGIC HISTORY: Patient's last menstrual period was 01/16/2002. Contraception: PMP Menopausal hormone therapy:  *** Last mammogram: 08-05-19 3D/Neg/BiRads1 Last pap smear:  08-07-18 Neg, 07-28-16 Neg, 06-24-14 Neg        OB History     Gravida  2   Para  2   Term  2   Preterm      AB      Living  2      SAB      IAB      Ectopic      Multiple      Live Births                 Patient Active Problem List   Diagnosis Date Noted   Degenerative cervical disc 04/10/2019   Carpal tunnel syndrome on left 02/27/2019   Left lumbar radiculopathy 12/28/2017   Nonallopathic lesion of thoracic region 08/02/2017   Swelling of both lower extremities 01/18/2017   Nonallopathic lesion of sacral region 01/03/2017   Allergic contact dermatitis due to metals 12/16/2016   Abnormal gait 04/10/2016   Dyslipidemia 08/12/2015   Dyspnea on exertion 08/12/2015   Palpitations 08/12/2015   Pes anserine bursitis 08/10/2015   Rupture of left posterior tibialis tendon 03/25/2015   OSA (obstructive sleep apnea) 03/05/2015   Navicular fracture of ankle 01/21/2015   Sinus infection 12/25/2014   Uric acid arthropathy 03/23/2014   High plasma oxalate 02/17/2014   Nonallopathic lesion of cervical region 12/19/2012   Fibroids 05/28/2012   Family history of ovarian cancer 05/28/2012   Strain of quadriceps muscle 04/17/2012   Nonallopathic lesion of costovertebral region 01/23/2012   Left shoulder pain 12/20/2011   Rotator cuff disorder 09/26/2011   Bilateral foot pain 09/20/2011   Arthralgia of multiple joints 07/26/2011   Arthritis of knee, degenerative 07/26/2011   Arthritis of hand, degenerative 07/26/2011   Low back pain 07/26/2011   Vitamin B 12 deficiency 03/05/2011   TMJ (temporomandibular joint syndrome)  03/05/2011   Pes planus 03/01/2011   Right knee pain 03/01/2011   Greater trochanteric bursitis of left hip 03/01/2011   Nonallopathic lesion of lumbosacral region 01/27/2011   Obesity 12/29/2010   Fatigue 12/29/2010   Allergic rhinitis, cause unspecified    Chronic pain    GERD (gastroesophageal reflux disease)    Essential hypertension    Sleep apnea     Past Medical History:  Diagnosis Date   Abnormal uterine bleeding    Allergic rhinitis, cause unspecified    Arthritis of knee, right    Bursitis of left shoulder    Cherry hemangioma 07/28/15   vulva   Chronic pain    Dyspareunia    Elevated cholesterol    Elevated uric acid in blood 2021   Fibroid    Foot fracture, left 01/2015   and torn tendons   GERD (gastroesophageal reflux disease)    HTN (hypertension)    Kidney stones    Sleep apnea    Urinary incontinence     Past Surgical History:  Procedure Laterality Date    dilatation and curettage  2000   CARPAL TUNNEL RELEASE  1998   CATARACT EXTRACTION Bilateral    dental implants  2010   DENTAL SURGERY     ESOPHAGEAL DILATION  2010   HAMMER  TOE SURGERY     rt   PELVIC LAPAROSCOPY  1990   ROTATOR CUFF REPAIR Right 09/2011   Dr. Tamera Punt   ROTATOR CUFF REPAIR Left 11/11/13   Dr. Tamera Punt    SEPTOPLASTY     UMBILICAL HERNIA REPAIR  2010    Current Outpatient Medications  Medication Sig Dispense Refill   ascorbic acid (VITAMIN C) 500 MG tablet Take 500 mg by mouth daily.     Cholecalciferol (VITAMIN D3) 5000 units CAPS Take 5,000 Units by mouth. Every other day     DULoxetine (CYMBALTA) 20 MG capsule Take 1 capsule (20 mg total) by mouth daily. 30 capsule 0   Menatetrenone (VITAMIN K2) 100 MCG TABS Take 160 mcg by mouth. Every 6th day. MK-7     Misc Natural Products (TART CHERRY ADVANCED PO) Take 800 mg by mouth daily.     MISC NATURAL PRODUCTS PO Take by mouth. Black currant seed oil     Multiple Vitamins-Minerals (SUPER MULTI-VITAMIN PO) Take 0.5 tablets  by mouth daily. Solgar Formula VM-75     Omega-3 Fatty Acids (FISH OIL) 1000 MG CAPS Take 500 mg by mouth. 1 per day     Probiotic Product (PROBIOTIC ADVANCED PO) Take by mouth. Patient states taking 5 billion, one capsule daily. Suprema Dophilus probiotic     SYRINGE-NEEDLE, DISP, 3 ML 25G X 1" 3 ML MISC Inject B12 every 2 weeks. 50 each 0   No current facility-administered medications for this visit.     ALLERGIES: Molds & smuts, Cefixime, Celebrex [celecoxib], Cobalt, Gabapentin, Levofloxacin, Molybdenum, Other, Penicillins, Robaxin [methocarbamol], Tomato, Adhesive [tape], Dust mite extract, Tetracycline hcl, and Tetracyclines & related  Family History  Problem Relation Age of Onset   Rheumatologic disease Mother    Diabetes Mother    Heart attack Father    Rheumatologic disease Sister    Arthritis Sister        --Rheumatoid arthritis   Ovarian cancer Sister 22   Crohn's disease Brother    Kidney failure Brother    Ulcerative colitis Brother    Glaucoma Brother     Social History   Socioeconomic History   Marital status: Married    Spouse name: Not on file   Number of children: Not on file   Years of education: Not on file   Highest education level: Not on file  Occupational History   Occupation: Retired  Tobacco Use   Smoking status: Never   Smokeless tobacco: Never  Vaping Use   Vaping Use: Never used  Substance and Sexual Activity   Alcohol use: No    Alcohol/week: 0.0 standard drinks   Drug use: No   Sexual activity: Not Currently    Partners: Male    Birth control/protection: Post-menopausal  Other Topics Concern   Not on file  Social History Narrative   Not on file   Social Determinants of Health   Financial Resource Strain: Not on file  Food Insecurity: Not on file  Transportation Needs: Not on file  Physical Activity: Not on file  Stress: Not on file  Social Connections: Not on file  Intimate Partner Violence: Not on file    Review of  Systems  PHYSICAL EXAMINATION:    LMP 01/16/2002     General appearance: alert, cooperative and appears stated age Head: Normocephalic, without obvious abnormality, atraumatic Neck: no adenopathy, supple, symmetrical, trachea midline and thyroid normal to inspection and palpation Lungs: clear to auscultation bilaterally Breasts: normal appearance, no masses or  tenderness, No nipple retraction or dimpling, No nipple discharge or bleeding, No axillary or supraclavicular adenopathy Heart: regular rate and rhythm Abdomen: soft, non-tender, no masses,  no organomegaly Extremities: extremities normal, atraumatic, no cyanosis or edema Skin: Skin color, texture, turgor normal. No rashes or lesions Lymph nodes: Cervical, supraclavicular, and axillary nodes normal. No abnormal inguinal nodes palpated Neurologic: Grossly normal  Pelvic: External genitalia:  no lesions              Urethra:  normal appearing urethra with no masses, tenderness or lesions              Bartholins and Skenes: normal                 Vagina: normal appearing vagina with normal color and discharge, no lesions              Cervix: no lesions                Bimanual Exam:  Uterus:  normal size, contour, position, consistency, mobility, non-tender              Adnexa: no mass, fullness, tenderness              Rectal exam: {yes no:314532}.  Confirms.              Anus:  normal sphincter tone, no lesions  Chaperone was present for exam:  ***  ASSESSMENT     PLAN     An After Visit Summary was printed and given to the patient.  ______ minutes face to face time of which over 50% was spent in counseling.

## 2020-08-11 ENCOUNTER — Ambulatory Visit: Payer: Medicare Other | Admitting: Obstetrics and Gynecology

## 2020-08-16 ENCOUNTER — Ambulatory Visit: Payer: Medicare Other | Admitting: Obstetrics and Gynecology

## 2020-08-23 ENCOUNTER — Encounter: Payer: Self-pay | Admitting: Family Medicine

## 2020-08-24 ENCOUNTER — Other Ambulatory Visit: Payer: Self-pay

## 2020-08-24 ENCOUNTER — Ambulatory Visit (INDEPENDENT_AMBULATORY_CARE_PROVIDER_SITE_OTHER): Payer: Medicare Other | Admitting: Family Medicine

## 2020-08-24 ENCOUNTER — Encounter: Payer: Self-pay | Admitting: Family Medicine

## 2020-08-24 DIAGNOSIS — G5602 Carpal tunnel syndrome, left upper limb: Secondary | ICD-10-CM | POA: Diagnosis not present

## 2020-08-24 DIAGNOSIS — M545 Low back pain, unspecified: Secondary | ICD-10-CM | POA: Diagnosis not present

## 2020-08-24 DIAGNOSIS — G8929 Other chronic pain: Secondary | ICD-10-CM

## 2020-08-24 NOTE — Patient Instructions (Signed)
TWY shoulder exercises If you start Cymbalta, please write [place] See what Dr. Grandville Silos has to say See me in 6 weeks

## 2020-08-24 NOTE — Progress Notes (Signed)
[place] Phone: [phone] Subjective:   Kerry [name], am serving as a scribe for Dr. Hulan Saas.  This visit occurred during the SARS-CoV-2 public health emergency.  Safety protocols were in place, including screening questions prior to the visit, additional usage of staff PPE, and extensive cleaning of exam room while observing appropriate contact time as indicated for disinfecting solutions.   I'm seeing this patient by the request  of:  Street, Kerry Mt, MD  CC: Multiple joint complaints  [id number]  Kerry [name] is a 70 y.o. female coming in with complaint of back and neck pain. OMT 08/03/2020.  Patient has had multiple other complaints of different joints.  Still has not started the Cymbalta and is awaiting after seeing other physicians.  Patient did have carpal tunnel release on the left side but continues to have difficulty with the numbness in the hands.  Patient is seeing the surgeon later today.   Patient did travel and was staying in a hotel for a long amount of time.  Patient states that the sleeping in the hotel room did cause some more discomfort and pain in multiple joints.  Patient states that she is feeling similar to last visit. Patient describes swelling over 2nd metacarpal when L shoulder hurts. Will start OT next week.   Medications patient has been prescribed: Cymbalta  Taking:not taking it          Past Medical History:  Diagnosis Date   Abnormal uterine bleeding    Allergic rhinitis, cause unspecified    Arthritis of knee, right    Bursitis of left shoulder    Cherry hemangioma 07/28/15   vulva   Chronic pain    Dyspareunia    Elevated cholesterol    Elevated uric acid in blood 2021   Fibroid    Foot fracture, left 01/2015   and torn tendons   GERD (gastroesophageal reflux disease)    HTN (hypertension)    Kidney stones    Sleep apnea    Urinary  incontinence     Allergies  Allergen Reactions   Molds & Smuts Other (See Comments), Palpitations, Shortness Of Breath and Swelling   Cefixime Swelling   Celebrex [Celecoxib] Swelling   Cobalt Other (See Comments)    Per allergist   Gabapentin Other (See Comments)    Joint pain   Levofloxacin Other (See Comments)    Muscle and joint pain   Molybdenum Other (See Comments)    Per allergist   Other Other (See Comments)    TEQUIN. TEQUIN - JOINT AND MUSCLE PAIN Other Reaction: painful joints TEQUIN. TEQUIN - JOINT AND MUSCLE PAIN Tantal Metal   Penicillins Hives   Robaxin [Methocarbamol]    Tomato Hives   Adhesive [Tape] Rash    Sensitive to EKG leads; sensitive to 57M Micropore tape   Dust Mite Extract Itching, Other (See Comments) and Palpitations   Tetracycline Hcl Rash    Can take Doxy   Tetracyclines & Related Rash     Review of Systems:  [name] headache, visual changes, nausea, vomiting, diarrhea, constipation, dizziness, abdominal pain, skin rash, fevers, chills, night sweats, weight loss, swollen lymph nodes, chest pain, shortness of breath, mood changes. POSITIVE muscle aches, body aches, joint swelling  Objective  Blood pressure 132/82, pulse 95, height '4\' 11"'$  (1.499 m), last menstrual period 01/16/2002, SpO2 97 %.   General: [name] apparent distress alert and oriented x3 mood and  affect normal, dressed appropriately.  Overweight HEENT: Pupils equal, extraocular movements intact  Respiratory: Patient's speak in full sentences and does not appear short of breath  Cardiovascular: Trace lower extremity edema, non tender, [name] erythema  Severely antalgic gait Significant arthritic changes of multiple joints including patient's knees. Low back exam does have significant loss of lordosis.  Unable to do [place] secondary to body habitus and sleep.  Patient has negative straight leg test but does have severe tightness of the hamstring bilaterally.  Mild worsening pain with any type of  extension of the back.  Osteopathic findings   C6 flexed rotated and side bent left T3 extended rotated and side bent right inhaled rib T10 extended rotated and side bent left L2 flexed rotated and side bent right L5 flexed rotated and side bent left Sacrum right on right       Assessment and Plan: Low back pain Chronic problem with the underlying polyarthropathy.  Discussed with patient about icing regimen and home exercises.  Discussed which activities to doing which wants to avoid.  Increase activity slowly.  Encourage patient to consider the possibility of Cymbalta.  Patient will consider it.  Awaiting follow-up for the other providers.  Follow-up with me again 6 to 8 weeks.    Nonallopathic problems  Decision today to treat with OMT was based on Physical Exam  After verbal consent patient was treated with HVLA, ME, FPR techniques in cervical, rib, thoracic, lumbar, and sacral  areas  Patient tolerated the procedure well with improvement in symptoms  Patient given exercises, stretches and lifestyle modifications  See medications in patient instructions if given  Patient will follow up in 4-8 weeks      The above documentation has been reviewed and is accurate and complete [name], DO        Note: This dictation was prepared with Dragon dictation along with smaller phrase technology. Any transcriptional errors that result from this process are unintentional.

## 2020-08-24 NOTE — Assessment & Plan Note (Signed)
Chronic problem with the underlying polyarthropathy.  Discussed with patient about icing regimen and home exercises.  Discussed which activities to doing which wants to avoid.  Increase activity slowly.  Encourage patient to consider the possibility of Cymbalta.  Patient will consider it.  Awaiting follow-up for the other providers.  Follow-up with me again 6 to 8 weeks.

## 2020-09-02 DIAGNOSIS — M542 Cervicalgia: Secondary | ICD-10-CM | POA: Diagnosis not present

## 2020-09-07 DIAGNOSIS — M542 Cervicalgia: Secondary | ICD-10-CM | POA: Diagnosis not present

## 2020-09-09 ENCOUNTER — Ambulatory Visit: Payer: Medicare Other

## 2020-09-09 ENCOUNTER — Ambulatory Visit
Admission: RE | Admit: 2020-09-09 | Discharge: 2020-09-09 | Disposition: A | Discharge status: Home / Self Care | Payer: Medicare Other | Source: Ambulatory Visit | Attending: Obstetrics and Gynecology | Admitting: Obstetrics and Gynecology

## 2020-09-09 ENCOUNTER — Other Ambulatory Visit: Payer: Self-pay

## 2020-09-09 DIAGNOSIS — Z1231 Encounter for screening mammogram for malignant neoplasm of breast: Secondary | ICD-10-CM

## 2020-09-14 ENCOUNTER — Other Ambulatory Visit: Payer: Self-pay

## 2020-09-14 ENCOUNTER — Ambulatory Visit (INDEPENDENT_AMBULATORY_CARE_PROVIDER_SITE_OTHER): Payer: Medicare Other | Admitting: Obstetrics and Gynecology

## 2020-09-14 ENCOUNTER — Encounter: Payer: Self-pay | Admitting: Obstetrics and Gynecology

## 2020-09-14 VITALS — BP 174/82 | HR 90 | Ht 59.0 in | Wt 216.0 lb

## 2020-09-14 DIAGNOSIS — Z124 Encounter for screening for malignant neoplasm of cervix: Secondary | ICD-10-CM

## 2020-09-14 DIAGNOSIS — Z8041 Family history of malignant neoplasm of ovary: Secondary | ICD-10-CM

## 2020-09-14 DIAGNOSIS — Z01419 Encounter for gynecological examination (general) (routine) without abnormal findings: Secondary | ICD-10-CM

## 2020-09-14 NOTE — Progress Notes (Signed)
GYNECOLOGY  VISIT   HPI: 70 y.o.   Married  Caucasian  female   G2P2002 with Patient's last menstrual period was 01/16/2002.   here for breast and pelvic exam.  Patient wanting to know about genetic testing for ovarian cancer since sister diagnosed at age 46. She is uncertain if her sister had genetic testing.   GYNECOLOGIC HISTORY: Patient's last menstrual period was 01/16/2002. Contraception:  PMP Menopausal hormone therapy:  none Last mammogram:  09-09-20 3D/neg/BiRads1 Last pap smear:  08-07-18 Neg, 07-28-16 Neg, 06-24-14 Neg        OB History     Gravida  2   Para  2   Term  2   Preterm      AB      Living  2      SAB      IAB      Ectopic      Multiple      Live Births                 Patient Active Problem List   Diagnosis Date Noted   Degenerative cervical disc 04/10/2019   Carpal tunnel syndrome on left 02/27/2019   Left lumbar radiculopathy 12/28/2017   Nonallopathic lesion of thoracic region 08/02/2017   Swelling of both lower extremities 01/18/2017   Nonallopathic lesion of sacral region 01/03/2017   Allergic contact dermatitis due to metals 12/16/2016   Abnormal gait 04/10/2016   Dyslipidemia 08/12/2015   Dyspnea on exertion 08/12/2015   Palpitations 08/12/2015   Pes anserine bursitis 08/10/2015   Rupture of left posterior tibialis tendon 03/25/2015   OSA (obstructive sleep apnea) 03/05/2015   Navicular fracture of ankle 01/21/2015   Sinus infection 12/25/2014   Uric acid arthropathy 03/23/2014   High plasma oxalate 02/17/2014   Nonallopathic lesion of cervical region 12/19/2012   Fibroids 05/28/2012   Family history of ovarian cancer 05/28/2012   Strain of quadriceps muscle 04/17/2012   Nonallopathic lesion of costovertebral region 01/23/2012   Left shoulder pain 12/20/2011   Rotator cuff disorder 09/26/2011   Bilateral foot pain 09/20/2011   Arthralgia of multiple joints 07/26/2011   Arthritis of knee, degenerative 07/26/2011    Arthritis of hand, degenerative 07/26/2011   Low back pain 07/26/2011   Vitamin B 12 deficiency 03/05/2011   TMJ (temporomandibular joint syndrome) 03/05/2011   Pes planus 03/01/2011   Right knee pain 03/01/2011   Greater trochanteric bursitis of left hip 03/01/2011   Nonallopathic lesion of lumbosacral region 01/27/2011   Obesity 12/29/2010   Fatigue 12/29/2010   Allergic rhinitis, cause unspecified    Chronic pain    GERD (gastroesophageal reflux disease)    Essential hypertension    Sleep apnea     Past Medical History:  Diagnosis Date   Abnormal uterine bleeding    Allergic rhinitis, cause unspecified    Arthritis of knee, right    Bursitis of left shoulder    Cherry hemangioma 07/28/15   vulva   Chronic pain    Dyspareunia    Elevated cholesterol    Elevated uric acid in blood 2021   Fibroid    Foot fracture, left 01/2015   and torn tendons   GERD (gastroesophageal reflux disease)    HTN (hypertension)    Kidney stones    Sleep apnea    Urinary incontinence     Past Surgical History:  Procedure Laterality Date    dilatation and curettage  2000   CARPAL TUNNEL RELEASE  1998   CARPAL TUNNEL RELEASE Left 05/21/2020   CATARACT EXTRACTION Bilateral    dental implants  2010   DENTAL SURGERY     ESOPHAGEAL DILATION  2010   HAMMER TOE SURGERY     rt   PELVIC LAPAROSCOPY  1990   ROTATOR CUFF REPAIR Right 09/2011   Dr. Tamera Punt   ROTATOR CUFF REPAIR Left 11/11/2013   Dr. Tamera Punt    SEPTOPLASTY     UMBILICAL HERNIA REPAIR  2010    Current Outpatient Medications  Medication Sig Dispense Refill   ascorbic acid (VITAMIN C) 500 MG tablet Take 500 mg by mouth daily.     Cholecalciferol (VITAMIN D3) 5000 units CAPS Take 5,000 Units by mouth. Every other day     Cyanocobalamin (VITAMIN B-12) 1000 MCG SUBL Place 1 tablet under the tongue 3 (three) times a week.     DULoxetine (CYMBALTA) 20 MG capsule Take 1 capsule (20 mg total) by mouth daily. 30 capsule 0    Menatetrenone (VITAMIN K2) 100 MCG TABS Take 160 mcg by mouth. Every 6th day. MK-7     Misc Natural Products (TART CHERRY ADVANCED PO) Take 800 mg by mouth daily.     MISC NATURAL PRODUCTS PO Take by mouth. Black currant seed oil     Multiple Vitamins-Minerals (SUPER MULTI-VITAMIN PO) Take 0.5 tablets by mouth daily. Solgar Formula VM-75     Omega-3 Fatty Acids (FISH OIL) 1000 MG CAPS Take 500 mg by mouth. 1 per day     Probiotic Product (PROBIOTIC ADVANCED PO) Take by mouth. Patient states taking 5 billion, one capsule daily. Suprema Dophilus probiotic     SYRINGE-NEEDLE, DISP, 3 ML 25G X 1" 3 ML MISC Inject B12 every 2 weeks. 50 each 0   No current facility-administered medications for this visit.     ALLERGIES: Molds & smuts, Cefixime, Celebrex [celecoxib], Cobalt, Gabapentin, Levofloxacin, Molybdenum, Other, Penicillins, Robaxin [methocarbamol], Tomato, Adhesive [tape], Dust mite extract, Tetracycline hcl, and Tetracyclines & related  Family History  Problem Relation Age of Onset   Rheumatologic disease Mother    Diabetes Mother    Heart attack Father    Rheumatologic disease Sister    Arthritis Sister        --Rheumatoid arthritis   Ovarian cancer Sister 93   Crohn's disease Brother    Kidney failure Brother    Ulcerative colitis Brother    Glaucoma Brother     Social History   Socioeconomic History   Marital status: Married    Spouse name: Not on file   Number of children: Not on file   Years of education: Not on file   Highest education level: Not on file  Occupational History   Occupation: Retired  Tobacco Use   Smoking status: Never   Smokeless tobacco: Never  Vaping Use   Vaping Use: Never used  Substance and Sexual Activity   Alcohol use: No    Alcohol/week: 0.0 standard drinks   Drug use: No   Sexual activity: Not Currently    Partners: Male    Birth control/protection: Post-menopausal  Other Topics Concern   Not on file  Social History Narrative   Not  on file   Social Determinants of Health   Financial Resource Strain: Not on file  Food Insecurity: Not on file  Transportation Needs: Not on file  Physical Activity: Not on file  Stress: Not on file  Social Connections: Not on file  Intimate Partner Violence: Not on file  Review of Systems  All other systems reviewed and are negative.  PHYSICAL EXAMINATION:    Ht '4\' 11"'$  (1.499 m)   Wt 216 lb (98 kg)   LMP 01/16/2002   BMI 43.63 kg/m     General appearance: alert, cooperative and appears stated age Lungs: clear to auscultation bilaterally Breasts: normal appearance, no masses or tenderness, No nipple retraction or dimpling, No nipple discharge or bleeding, No axillary or supraclavicular adenopathy Heart: regular rate and rhythm Abdomen: soft, non-tender, no masses,  no organomegaly No abnormal inguinal nodes palpated  Pelvic: External genitalia:  no lesions              Urethra:  normal appearing urethra with no masses, tenderness or lesions              Bartholins and Skenes: normal                 Vagina: normal appearing vagina with normal color and discharge, no lesions              Cervix: no lesions. Pap and HR HV collected.                Bimanual Exam:  Uterus:  normal size, contour, position, consistency, mobility, non-tender              Adnexa: no mass, fullness, tenderness              Rectal exam: yes.  Confirms.              Anus:  normal sphincter tone, no lesions  Chaperone was present for exam:  Eustace Pen, CMA.  ASSESSMENT  Well woman with normal GYN exam.  Cervical cancer screening.  FH ovarian cancer in her sister age 67.  Fibroids.  Possible cervical fibroids versus nabothian cyst.   PLAN  Follow up pap and HR HPV testing.  Yearly mammogram.  Self breast exam encouraged.  We discussed potential genetic counseling and testing.  We reviewed the process for this as well as implications of positive and negative test results.  Yearly follow up  ultrasound due to family history of ovarian cancer.     An After Visit Summary was printed and given to the patient.  22 min  total time was spent for this patient encounter, including preparation, face-to-face counseling with the patient, coordination of care, and documentation of the encounter.

## 2020-09-14 NOTE — Patient Instructions (Addendum)

## 2020-09-15 ENCOUNTER — Other Ambulatory Visit (HOSPITAL_COMMUNITY)
Admission: RE | Admit: 2020-09-15 | Discharge: 2020-09-15 | Disposition: A | Discharge status: Home / Self Care | Payer: Medicare Other | Source: Ambulatory Visit | Attending: Obstetrics and Gynecology | Admitting: Obstetrics and Gynecology

## 2020-09-15 DIAGNOSIS — Z01419 Encounter for gynecological examination (general) (routine) without abnormal findings: Secondary | ICD-10-CM | POA: Insufficient documentation

## 2020-09-15 DIAGNOSIS — Z1151 Encounter for screening for human papillomavirus (HPV): Secondary | ICD-10-CM | POA: Diagnosis not present

## 2020-09-15 DIAGNOSIS — Z124 Encounter for screening for malignant neoplasm of cervix: Secondary | ICD-10-CM | POA: Insufficient documentation

## 2020-09-16 DIAGNOSIS — M542 Cervicalgia: Secondary | ICD-10-CM | POA: Diagnosis not present

## 2020-09-17 LAB — CYTOLOGY - PAP
Comment: NEGATIVE
Diagnosis: NEGATIVE
High risk HPV: NEGATIVE

## 2020-09-23 DIAGNOSIS — R6884 Jaw pain: Secondary | ICD-10-CM | POA: Diagnosis not present

## 2020-09-23 DIAGNOSIS — H9313 Tinnitus, bilateral: Secondary | ICD-10-CM | POA: Diagnosis not present

## 2020-09-23 DIAGNOSIS — Q381 Ankyloglossia: Secondary | ICD-10-CM | POA: Diagnosis not present

## 2020-09-24 DIAGNOSIS — M542 Cervicalgia: Secondary | ICD-10-CM | POA: Diagnosis not present

## 2020-09-27 DIAGNOSIS — E538 Deficiency of other specified B group vitamins: Secondary | ICD-10-CM | POA: Diagnosis not present

## 2020-09-27 DIAGNOSIS — I5032 Chronic diastolic (congestive) heart failure: Secondary | ICD-10-CM | POA: Diagnosis not present

## 2020-09-27 DIAGNOSIS — Z23 Encounter for immunization: Secondary | ICD-10-CM | POA: Diagnosis not present

## 2020-09-27 DIAGNOSIS — Z0000 Encounter for general adult medical examination without abnormal findings: Secondary | ICD-10-CM | POA: Diagnosis not present

## 2020-09-27 DIAGNOSIS — E785 Hyperlipidemia, unspecified: Secondary | ICD-10-CM | POA: Diagnosis not present

## 2020-09-27 DIAGNOSIS — E790 Hyperuricemia without signs of inflammatory arthritis and tophaceous disease: Secondary | ICD-10-CM | POA: Diagnosis not present

## 2020-09-27 DIAGNOSIS — I110 Hypertensive heart disease with heart failure: Secondary | ICD-10-CM | POA: Diagnosis not present

## 2020-09-27 DIAGNOSIS — Z79899 Other long term (current) drug therapy: Secondary | ICD-10-CM | POA: Diagnosis not present

## 2020-09-27 DIAGNOSIS — I5189 Other ill-defined heart diseases: Secondary | ICD-10-CM | POA: Diagnosis not present

## 2020-09-27 DIAGNOSIS — R7301 Impaired fasting glucose: Secondary | ICD-10-CM | POA: Diagnosis not present

## 2020-09-30 DIAGNOSIS — M542 Cervicalgia: Secondary | ICD-10-CM | POA: Diagnosis not present

## 2020-10-05 ENCOUNTER — Encounter: Payer: Self-pay | Admitting: Family Medicine

## 2020-10-06 ENCOUNTER — Other Ambulatory Visit: Payer: Self-pay

## 2020-10-06 ENCOUNTER — Ambulatory Visit (INDEPENDENT_AMBULATORY_CARE_PROVIDER_SITE_OTHER): Payer: Medicare Other | Admitting: Pulmonary Disease

## 2020-10-06 ENCOUNTER — Ambulatory Visit (INDEPENDENT_AMBULATORY_CARE_PROVIDER_SITE_OTHER): Payer: Medicare Other | Admitting: Family Medicine

## 2020-10-06 ENCOUNTER — Encounter: Payer: Self-pay | Admitting: Family Medicine

## 2020-10-06 ENCOUNTER — Encounter: Payer: Self-pay | Admitting: Pulmonary Disease

## 2020-10-06 VITALS — BP 162/84 | HR 101 | Temp 98.5°F | Ht 59.0 in | Wt 213.6 lb

## 2020-10-06 VITALS — BP 152/90 | HR 81 | Ht 59.0 in | Wt 213.0 lb

## 2020-10-06 DIAGNOSIS — E669 Obesity, unspecified: Secondary | ICD-10-CM

## 2020-10-06 DIAGNOSIS — G8929 Other chronic pain: Secondary | ICD-10-CM

## 2020-10-06 DIAGNOSIS — G4733 Obstructive sleep apnea (adult) (pediatric): Secondary | ICD-10-CM

## 2020-10-06 DIAGNOSIS — M545 Low back pain, unspecified: Secondary | ICD-10-CM | POA: Diagnosis not present

## 2020-10-06 DIAGNOSIS — M9903 Segmental and somatic dysfunction of lumbar region: Secondary | ICD-10-CM | POA: Diagnosis not present

## 2020-10-06 DIAGNOSIS — G4730 Sleep apnea, unspecified: Secondary | ICD-10-CM | POA: Diagnosis not present

## 2020-10-06 DIAGNOSIS — M9901 Segmental and somatic dysfunction of cervical region: Secondary | ICD-10-CM | POA: Diagnosis not present

## 2020-10-06 DIAGNOSIS — M9908 Segmental and somatic dysfunction of rib cage: Secondary | ICD-10-CM | POA: Diagnosis not present

## 2020-10-06 DIAGNOSIS — M9902 Segmental and somatic dysfunction of thoracic region: Secondary | ICD-10-CM

## 2020-10-06 DIAGNOSIS — Z9989 Dependence on other enabling machines and devices: Secondary | ICD-10-CM

## 2020-10-06 DIAGNOSIS — M9904 Segmental and somatic dysfunction of sacral region: Secondary | ICD-10-CM | POA: Diagnosis not present

## 2020-10-06 NOTE — Telephone Encounter (Signed)
Dr. Halford Chessman, please see patients mychart message.

## 2020-10-06 NOTE — Patient Instructions (Signed)
See you again in 6-8 weeks

## 2020-10-06 NOTE — Assessment & Plan Note (Addendum)
Chronic, multifactorial, worse.  Still think there is some potential underlying seronegative rheumatoid arthritis.  He will discuss Cymbalta which patient is continuing to take at the moment.  Discussed which activities to do.  Patient is responding well to the myofascial treatment renal follow-up again again in 6 weeks total time reviewing patient's previous notes, her MyChart messages, as well as labs from outside facility greater than 31 minutes

## 2020-10-06 NOTE — Progress Notes (Signed)
[facility]  Chief Complaint  Patient presents with   Follow-up    Wearing CPAP-had mask fitting still not good fit,having air leaks    Constitutional:  BP (!) 162/84 (BP Location: Left Arm, Cuff Size: Large)   Pulse (!) 101   Temp 98.5 F (36.9 C) (Temporal)   Ht 4\' 11"  (1.499 m)   Wt 213 lb 9.6 oz (96.9 kg)   LMP 01/16/2002   SpO2 97%   BMI 43.14 kg/m   Past Medical History:  Allergies, GERD, HTN, Nephrolithiasis, Chronic pain  Past Surgical History:  She  has a past surgical history that includes Umbilical hernia repair (2010); Carpal tunnel release (1998); dental implants (2010); Esophageal dilation (2010); Hammer toe surgery; Septoplasty; Rotator cuff repair (Right, 09/2011); Pelvic laparoscopy (1990);  dilatation and curettage (2000); Rotator cuff repair (Left, 11/11/2013); Cataract extraction (Bilateral); Dental surgery; and Carpal tunnel release (Left, 05/21/2020).  Brief Summary:  Kerry [name] is a 70 y.o. female with obstructive sleep apnea.      Subjective:   She has document with several questions detailed on this.  Her mask has exhalation port in front and this can sometimes blow on her husbands face.  The mask fits better, but still has trouble on bridge of her nose.  She hasn't developed any skin irritation from mask.  She hasn't noticed any discomfort in her TMJ.  She is concerned that air leak might be preventing her from getting deep sleep.  She has been frustrated about conflicting information she gets whenever she contacts her DME.  She asked about utility of getting an Apple watch.  Physical Exam:   Appearance - well kempt   ENMT - no sinus tenderness, no oral exudate, no LAN, Mallampati 3 airway, no stridor, wears dentures  Respiratory - equal breath sounds bilaterally, no wheezing or rales  CV - s1s2 regular rate and rhythm, no murmurs  Ext - no clubbing, no edema  Skin - no  rashes  Psych - normal mood and affect    Sleep Tests:  HST 03/04/15 >> AHI 21.6, SpO2 low 80% Auto CPAP 09/04/20 to 10/03/20 >> used on 30 of 30 nights with average 6 hrs 0 min.  Average AHI 0.2 with median CPAP 8 and 95 th percentile CPAP 9 cm H2O  Cardiac Tests:  Echo 04/25/19 >> EF 60 to 65%, mild LVH, grade 2 DD  Social History:  She  reports that she has never smoked. She has never used smokeless tobacco. She reports that she does not drink alcohol and does not use drugs.  Family History:  Her family history includes Arthritis in her sister; Crohn's disease in her brother; Diabetes in her mother; Glaucoma in her brother; Heart attack in her father; Kidney failure in her brother; Ovarian cancer (age of onset: 58) in her sister; Rheumatologic disease in her mother and sister; Ulcerative colitis in her brother.     Assessment/Plan:   Obstructive sleep apnea. - she is compliant with CPAP and reports benefit from therapy - she uses [facility] for her DME - continue auto CPAP 5 to 15 cm H2O - discussed techniques to improve mask fit - advised it is at her discretion to get an Apple watch - explained the recent issues with supply chain for CPAP parts  Obesity. - discussed how her weight is contributing to her health issues, including her sleep apnea  Time Spent Involved in Patient Care on Day of Examination:  33 minutes  Follow up:   Patient Instructions  Follow up in 1 year  Medication List:   Allergies as of 10/06/2020       Reactions   Molds & Smuts Other (See Comments), Palpitations, Shortness Of Breath, Swelling   Cefixime Swelling   Celebrex [celecoxib] Swelling   Cobalt Other (See Comments)   Per allergist   Gabapentin Other (See Comments)   Joint pain   Levofloxacin Other (See Comments)   Muscle and joint pain   Molybdenum Other (See Comments)   Per allergist   Other Other (See Comments)   TEQUIN. TEQUIN - JOINT AND MUSCLE PAIN Other Reaction:  painful joints TEQUIN. TEQUIN - JOINT AND MUSCLE PAIN Tantal Metal   Penicillins Hives   Robaxin [methocarbamol]    Tomato Hives   Adhesive [tape] Rash   Sensitive to EKG leads; sensitive to 58M Micropore tape   Dust Mite Extract Itching, Other (See Comments), Palpitations   Tetracycline Hcl Rash   Can take Doxy   Tetracyclines & Related Rash        Medication List        Accurate as of October 06, 2020  1:35 PM. If you have any questions, ask your nurse or doctor.          ascorbic acid 500 MG tablet Commonly known as: VITAMIN C Take 500 mg by mouth daily.   DULoxetine 20 MG capsule Commonly known as: Cymbalta Take 1 capsule (20 mg total) by mouth daily.   Fish Oil 1000 MG Caps Take 500 mg by mouth. 1 per day   PROBIOTIC ADVANCED PO Take by mouth. Patient states taking 5 billion, one capsule daily. Suprema Dophilus probiotic   SUPER MULTI-VITAMIN PO Take 0.5 tablets by mouth daily. Solgar Formula VM-75   SYRINGE-NEEDLE (DISP) 3 ML 25G X 1" 3 ML Misc Inject B12 every 2 weeks.   TART CHERRY ADVANCED PO Take 800 mg by mouth daily.   MISC NATURAL PRODUCTS PO Take by mouth. Black currant seed oil   Vitamin B-12 1000 MCG Subl Place 1 tablet under the tongue 3 (three) times a week.   Vitamin D3 125 MCG (5000 UT) Caps Take 5,000 Units by mouth. Every other day   Vitamin K2 100 MCG Tabs Take 160 mcg by mouth. Every 6th day. MK-7   XLEAR SINUS CARE SPRAY NA Place into the nose. 2 sprays at bedtime        Signature:  Chesley Mires, MD [place] Pager - [phone] 10/06/2020, 1:35 PM

## 2020-10-06 NOTE — Patient Instructions (Signed)
Follow up in 1 year.

## 2020-10-06 NOTE — Progress Notes (Signed)
Zach Katelynd [place] [place] Phone: [phone] Subjective:    I'm seeing this patient by the request  of:  Street, [name], MD  CC: Back and neck pain  TDS:[id number]  Kerry [name] is a 70 y.o. female coming in with complaint of back and neck pain. OMT on 08/03/2020. Patient states pain remains the same.  Patient has been doing some myofascial release via physical therapy and has noticed some improvement.  Patient states each time she has not has more pain the next day but then 3 days later is feeling much better.  Medications patient has been prescribed: Cymbalta          Past Medical History:  Diagnosis Date   Abnormal uterine bleeding    Allergic rhinitis, cause unspecified    Arthritis of knee, right    Bursitis of left shoulder    Cherry hemangioma 07/28/15   vulva   Chronic pain    Dyspareunia    Elevated cholesterol    Elevated uric acid in blood 2021   Fibroid    Foot fracture, left 01/2015   and torn tendons   GERD (gastroesophageal reflux disease)    HTN (hypertension)    Kidney stones    Sleep apnea    Urinary incontinence     Allergies  Allergen Reactions   Molds & Smuts Other (See Comments), Palpitations, Shortness Of Breath and Swelling   Cefixime Swelling   Celebrex [Celecoxib] Swelling   Cobalt Other (See Comments)    Per allergist   Gabapentin Other (See Comments)    Joint pain   Levofloxacin Other (See Comments)    Muscle and joint pain   Molybdenum Other (See Comments)    Per allergist   Other Other (See Comments)    TEQUIN. TEQUIN - JOINT AND MUSCLE PAIN Other Reaction: painful joints TEQUIN. TEQUIN - JOINT AND MUSCLE PAIN Tantal Metal   Penicillins Hives   Robaxin [Methocarbamol]    Tomato Hives   Adhesive [Tape] Rash    Sensitive to EKG leads; sensitive to 97M Micropore tape   Dust Mite Extract Itching, Other (See Comments) and Palpitations   Tetracycline Hcl  Rash    Can take Doxy   Tetracyclines & Related Rash     Review of Systems:  No headache, visual changes, nausea, vomiting, diarrhea, constipation, dizziness, abdominal pain, skin rash, fevers, chills, night sweats, weight loss, swollen lymph nodes, body aches, joint swelling, chest pain, shortness of breath, mood changes. POSITIVE muscle aches  Objective  Blood pressure (!) 152/90, pulse 81, height 4\' 11"  (1.499 m), weight 213 lb (96.6 kg), last menstrual period 01/16/2002, SpO2 96 %.   General: No apparent distress alert and oriented x3 mood and affect normal, dressed appropriately.  HEENT: Pupils equal, extraocular movements intact  Respiratory: Patient's speak in full sentences and does not appear short of breath  Cardiovascular: No lower extremity edema, non tender, no erythema  Antalgic gait Arthritic changes of multiple joints.  Tenderness to palpation of the paraspinal musculature.  He does have very Trace amount of swelling of the MCPs.  Osteopathic findings  C5 flexed rotated and side bent left T5 extended rotated and side bent right inhaled rib T8 extended rotated and side bent left L2 flexed rotated and side bent right L5 flexed rotated and side bent left Sacrum right on right     Assessment and Plan:  Low back pain Chronic, multifactorial, worse.  Still think there  is some potential underlying seronegative rheumatoid arthritis.  He will discuss Cymbalta which patient is continuing to take at the moment.  Discussed which activities to do.  Patient is responding well to the myofascial treatment renal follow-up again again in 6 weeks total time reviewing patient's previous notes, her MyChart messages, as well as labs from outside facility greater than 31 minutes   Nonallopathic problems  Decision today to treat with OMT was based on Physical Exam  After verbal consent patient was treated with  ME, FPR techniques in cervical, rib, thoracic, lumbar, and sacral   areas  Patient tolerated the procedure well with improvement in symptoms  Patient given exercises, stretches and lifestyle modifications  See medications in patient instructions if given  Patient will follow up in 4-8 weeks      The above documentation has been reviewed and is accurate and complete [name], DO        Note: This dictation was prepared with Dragon dictation along with smaller phrase technology. Any transcriptional errors that result from this process are unintentional.

## 2020-10-12 DIAGNOSIS — H903 Sensorineural hearing loss, bilateral: Secondary | ICD-10-CM | POA: Insufficient documentation

## 2020-10-12 DIAGNOSIS — Q381 Ankyloglossia: Secondary | ICD-10-CM | POA: Diagnosis not present

## 2020-10-12 DIAGNOSIS — R6884 Jaw pain: Secondary | ICD-10-CM | POA: Diagnosis not present

## 2020-10-12 DIAGNOSIS — J343 Hypertrophy of nasal turbinates: Secondary | ICD-10-CM | POA: Diagnosis not present

## 2020-10-14 DIAGNOSIS — H35373 Puckering of macula, bilateral: Secondary | ICD-10-CM | POA: Diagnosis not present

## 2020-10-14 DIAGNOSIS — H02834 Dermatochalasis of left upper eyelid: Secondary | ICD-10-CM | POA: Diagnosis not present

## 2020-10-14 DIAGNOSIS — H04123 Dry eye syndrome of bilateral lacrimal glands: Secondary | ICD-10-CM | POA: Diagnosis not present

## 2020-10-14 DIAGNOSIS — Z961 Presence of intraocular lens: Secondary | ICD-10-CM | POA: Diagnosis not present

## 2020-10-21 DIAGNOSIS — M542 Cervicalgia: Secondary | ICD-10-CM | POA: Diagnosis not present

## 2020-10-26 DIAGNOSIS — M542 Cervicalgia: Secondary | ICD-10-CM | POA: Diagnosis not present

## 2020-11-01 DIAGNOSIS — Z23 Encounter for immunization: Secondary | ICD-10-CM | POA: Diagnosis not present

## 2020-11-04 DIAGNOSIS — M542 Cervicalgia: Secondary | ICD-10-CM | POA: Diagnosis not present

## 2020-11-09 DIAGNOSIS — J343 Hypertrophy of nasal turbinates: Secondary | ICD-10-CM | POA: Insufficient documentation

## 2020-11-09 DIAGNOSIS — R1313 Dysphagia, pharyngeal phase: Secondary | ICD-10-CM | POA: Diagnosis not present

## 2020-11-11 DIAGNOSIS — M542 Cervicalgia: Secondary | ICD-10-CM | POA: Diagnosis not present

## 2020-11-15 DIAGNOSIS — M542 Cervicalgia: Secondary | ICD-10-CM | POA: Diagnosis not present

## 2020-11-17 NOTE — Progress Notes (Signed)
Benito Mccreedy D.[place] Phone: [phone]   Assessment and Plan:     1. Acute left-sided thoracic back pain 2. Somatic dysfunction of lumbar region 3. Somatic dysfunction of thoracic region 4. Somatic dysfunction of pelvic region -Chronic with exacerbation, subsequent sports medicine visit - Patient is suffering from multiple musculoskeletal complaints including left shoulder blade, left thoracic region, left low back pain, left neck pain that of been progressively worsening over the past several weeks - Start meloxicam 7.5 mg daily.  Patient may cut medication in half if medication bothers her stomach.  Complete 14 total days of medication and then stop - Patient elected for repeat OMT.  Tolerated well per note below   Decision today to treat with OMT was based on Physical Exam   After verbal consent patient was treated with  ME (muscle energy), FPR (flex positional release), ST (soft tissue), PC/PD (Pelvic Compression/ Pelvic Decompression) techniques in thoracic, lumbar, and pelvic areas. Patient tolerated the procedure well with improvement in symptoms.  Patient educated on potential side effects of soreness and recommended to rest, hydrate, and use Tylenol as needed for pain control.   Pertinent previous records reviewed include none   Follow Up: 2 weeks for reevaluation.  Could consider repeat OMT versus imaging at that time   Subjective:    Chief Complaint: neck and back pain   HPI:   11/18/20 Patient is a 70 year old female presenting with neck and back pain. Patient last saw Dr. Tamala Julian on 10/06/20 and had OMT. Today patient states left shdoulder, neck, scapula, and hand. Very painful, sharp with certain movements. Pressure in arm. OT has been working on throat and that when the shoulder began to hurt more  Relevant Historical Information: OSA, carpal tunnel post release,  Additional pertinent review of systems  negative.  Current Outpatient Medications  Medication Sig Dispense Refill   meloxicam (MOBIC) 7.5 MG tablet Take 1 tablet (7.5 mg total) by mouth daily. 14 tablet 0   ascorbic acid (VITAMIN C) 500 MG tablet Take 500 mg by mouth daily.     Cholecalciferol (VITAMIN D3) 5000 units CAPS Take 5,000 Units by mouth. Every other day     Cyanocobalamin (VITAMIN B-12) 1000 MCG SUBL Place 1 tablet under the tongue 3 (three) times a week.     DULoxetine (CYMBALTA) 20 MG capsule Take 1 capsule (20 mg total) by mouth daily. (Patient not taking: Reported on 10/06/2020) 30 capsule 0   Menatetrenone (VITAMIN K2) 100 MCG TABS Take 160 mcg by mouth. Every 6th day. MK-7     Misc Natural Products (TART CHERRY ADVANCED PO) Take 800 mg by mouth daily.     MISC NATURAL PRODUCTS PO Take by mouth. Black currant seed oil     Multiple Vitamins-Minerals (SUPER MULTI-VITAMIN PO) Take 0.5 tablets by mouth daily. Solgar Formula VM-75     Omega-3 Fatty Acids (FISH OIL) 1000 MG CAPS Take 500 mg by mouth. 1 per day     Probiotic Product (PROBIOTIC ADVANCED PO) Take by mouth. Patient states taking 5 billion, one capsule daily. Suprema Dophilus probiotic     Sodium Chloride-Xylitol (XLEAR SINUS CARE SPRAY NA) Place into the nose. 2 sprays at bedtime     SYRINGE-NEEDLE, DISP, 3 ML 25G X 1" 3 ML MISC Inject B12 every 2 weeks. (Patient not taking: Reported on 10/06/2020) 50 each 0   No current facility-administered medications for this visit.  Objective:     Vitals:   11/18/20 1405  Pulse: 88  SpO2: 99%  Weight: 213 lb (96.6 kg)      Body mass index is 43.02 kg/m.    Physical Exam:     General: Well-appearing, cooperative, sitting comfortably in no acute distress.   OMT Physical Exam:  ASIS Compression Test: Positive Right Thoracic: TTP paraspinal, significantly limited rotation throughout thoracic spine with increased kyphosis Lumbar: TTP paraspinal, L5 rotated right Pelvis: Right anterior  innominate  Electronically signed by:  Benito Mccreedy D.[facility] 4:28 PM 11/18/20

## 2020-11-18 ENCOUNTER — Other Ambulatory Visit: Payer: Self-pay

## 2020-11-18 ENCOUNTER — Ambulatory Visit: Payer: Medicare Other | Admitting: Family Medicine

## 2020-11-18 ENCOUNTER — Ambulatory Visit (INDEPENDENT_AMBULATORY_CARE_PROVIDER_SITE_OTHER): Payer: Medicare Other | Admitting: Sports Medicine

## 2020-11-18 VITALS — HR 88 | Wt 213.0 lb

## 2020-11-18 DIAGNOSIS — M9903 Segmental and somatic dysfunction of lumbar region: Secondary | ICD-10-CM

## 2020-11-18 DIAGNOSIS — M546 Pain in thoracic spine: Secondary | ICD-10-CM | POA: Diagnosis not present

## 2020-11-18 DIAGNOSIS — M9905 Segmental and somatic dysfunction of pelvic region: Secondary | ICD-10-CM

## 2020-11-18 DIAGNOSIS — M9902 Segmental and somatic dysfunction of thoracic region: Secondary | ICD-10-CM | POA: Diagnosis not present

## 2020-11-18 MED ORDER — MELOXICAM 7.5 MG PO TABS: 7.5000 mg | ORAL_TABLET | Freq: Every day | ORAL | 0 refills | Status: DC

## 2020-11-18 NOTE — Patient Instructions (Signed)
Good to see you  7.5 meloxicam daily for 2 weeks Can cut pill in half if it bothers stomach

## 2020-11-30 DIAGNOSIS — M542 Cervicalgia: Secondary | ICD-10-CM | POA: Diagnosis not present

## 2020-12-02 ENCOUNTER — Other Ambulatory Visit: Payer: Self-pay

## 2020-12-02 ENCOUNTER — Ambulatory Visit (INDEPENDENT_AMBULATORY_CARE_PROVIDER_SITE_OTHER): Payer: Medicare Other | Admitting: Sports Medicine

## 2020-12-02 VITALS — BP 150/92 | HR 86 | Ht 59.0 in | Wt 213.0 lb

## 2020-12-02 DIAGNOSIS — M545 Low back pain, unspecified: Secondary | ICD-10-CM | POA: Diagnosis not present

## 2020-12-02 DIAGNOSIS — M9905 Segmental and somatic dysfunction of pelvic region: Secondary | ICD-10-CM

## 2020-12-02 DIAGNOSIS — M9902 Segmental and somatic dysfunction of thoracic region: Secondary | ICD-10-CM | POA: Diagnosis not present

## 2020-12-02 DIAGNOSIS — M9908 Segmental and somatic dysfunction of rib cage: Secondary | ICD-10-CM | POA: Diagnosis not present

## 2020-12-02 DIAGNOSIS — G8929 Other chronic pain: Secondary | ICD-10-CM | POA: Diagnosis not present

## 2020-12-02 DIAGNOSIS — M9901 Segmental and somatic dysfunction of cervical region: Secondary | ICD-10-CM | POA: Diagnosis not present

## 2020-12-02 DIAGNOSIS — M9903 Segmental and somatic dysfunction of lumbar region: Secondary | ICD-10-CM

## 2020-12-02 NOTE — Progress Notes (Signed)
Kerry [name] D.[facility] [place] Phone: [phone]   Assessment and Plan:     1. Somatic dysfunction of cervical region 2. Somatic dysfunction of thoracic region 3. Somatic dysfunction of lumbar region 4. Somatic dysfunction of pelvic region 5. Somatic dysfunction of rib region 6. Chronic bilateral low back pain without sciatica -Chronic with exacerbation, subsequent visit - Recurrence of typical musculoskeletal complaints with most prominent being in low back - Patient received minimal relief using meloxicam and appears to had side effects.  Instructed patient to discontinue meloxicam - Patient has received significant relief with OMT in the past.  Elects for repeat OMT today.  Tolerated well per note below. - Decision today to treat with OMT was based on Physical Exam   After verbal consent patient was treated with HVLA (high velocity low amplitude), ME (muscle energy), FPR (flex positional release), ST (soft tissue), PC/PD (Pelvic Compression/ Pelvic Decompression) techniques in cervical, rib, thoracic, lumbar, and pelvic areas. Patient tolerated the procedure well with improvement in symptoms.  Patient educated on potential side effects of soreness and recommended to rest, hydrate, and use Tylenol as needed for pain control.   Pertinent previous records reviewed include none   Follow Up: 3 weeks for repeat OMT   Subjective:   I, Kerry [name], am serving as a scribe for Dr. Glennon [name]  Chief Complaint: neck and back pain   HPI:   11/18/20 Patient is a 70 year old female presenting with neck and back pain. Patient last saw Dr. Tamala [name] on 10/06/20 and had OMT. Today patient states left shdoulder, neck, scapula, and hand. Very painful, sharp with certain movements. Pressure in arm. OT has been working on throat and that when the shoulder began to hurt more  12/02/20 Patient states shoulder remained sharp pain even after  the meloxicam, since seeing the PT and the physical therapist worked on her lower back and the psoas and that had helped her right shoulder after the appointment. Patient states that she s still on the meloxicam for a couple days but patient has a hot flushing feeling since taking it, acid reflux with sore throat, stomach pain right below the sternum, abdominal pain by her belly button. Patient has also been having joint pain at the end of her fingers, increased bladder spasms and puffy hands.  Relevant Historical Information: OSA, carpal tunnel post release,  Additional pertinent review of systems negative.  Current Outpatient Medications  Medication Sig Dispense Refill   ascorbic acid (VITAMIN C) 500 MG tablet Take 500 mg by mouth daily.     Cholecalciferol (VITAMIN D3) 5000 units CAPS Take 5,000 Units by mouth. Every other day     Cyanocobalamin (VITAMIN B-12) 1000 MCG SUBL Place 1 tablet under the tongue 3 (three) times a week.     meloxicam (MOBIC) 7.5 MG tablet Take 1 tablet (7.5 mg total) by mouth daily. 14 tablet 0   Menatetrenone (VITAMIN K2) 100 MCG TABS Take 160 mcg by mouth. Every 6th day. MK-7     Misc Natural Products (TART CHERRY ADVANCED PO) Take 800 mg by mouth daily.     MISC NATURAL PRODUCTS PO Take by mouth. Black currant seed oil     Multiple Vitamins-Minerals (SUPER MULTI-VITAMIN PO) Take 0.5 tablets by mouth daily. Solgar Formula VM-75     Omega-3 Fatty Acids (FISH OIL) 1000 MG CAPS Take 500 mg by mouth. 1 per day     Probiotic Product (PROBIOTIC  ADVANCED PO) Take by mouth. Patient states taking 5 billion, one capsule daily. Suprema Dophilus probiotic     Sodium Chloride-Xylitol (XLEAR SINUS CARE SPRAY NA) Place into the nose. 2 sprays at bedtime     SYRINGE-NEEDLE, DISP, 3 ML 25G X 1" 3 ML MISC Inject B12 every 2 weeks. 50 each 0   DULoxetine (CYMBALTA) 20 MG capsule Take 1 capsule (20 mg total) by mouth daily. (Patient not taking: Reported on 10/06/2020) 30 capsule 0    No current facility-administered medications for this visit.      Objective:     Vitals:   12/02/20 1252  BP: (!) 150/92  Pulse: 86  SpO2: 97%  Weight: 213 lb (96.6 kg)  Height: 4\' 11"  (1.499 m)      Body mass index is 43.02 kg/m.    Physical Exam:     General: Well-appearing, cooperative, sitting comfortably in no acute distress.   OMT Physical Exam:  ASIS Compression Test: Positive Right Thoracic: TTP paraspinal, significantly limited rotation throughout thoracic spine with increased kyphosis Lumbar: TTP paraspinal, L5 rotated right Pelvis: Right anterior innominate  Electronically signed by:  Kerry [name] D.[facility] 2:24 PM 12/02/20

## 2020-12-02 NOTE — Patient Instructions (Addendum)
Good to see you   Follow up in 3 weeks

## 2020-12-07 DIAGNOSIS — H43391 Other vitreous opacities, right eye: Secondary | ICD-10-CM | POA: Diagnosis not present

## 2020-12-07 DIAGNOSIS — H43811 Vitreous degeneration, right eye: Secondary | ICD-10-CM | POA: Diagnosis not present

## 2020-12-07 DIAGNOSIS — H35373 Puckering of macula, bilateral: Secondary | ICD-10-CM | POA: Diagnosis not present

## 2020-12-07 DIAGNOSIS — H31091 Other chorioretinal scars, right eye: Secondary | ICD-10-CM | POA: Diagnosis not present

## 2020-12-07 DIAGNOSIS — Z961 Presence of intraocular lens: Secondary | ICD-10-CM | POA: Diagnosis not present

## 2020-12-07 DIAGNOSIS — H35371 Puckering of macula, right eye: Secondary | ICD-10-CM | POA: Diagnosis not present

## 2020-12-14 DIAGNOSIS — M542 Cervicalgia: Secondary | ICD-10-CM | POA: Diagnosis not present

## 2020-12-22 NOTE — Progress Notes (Signed)
Benito Mccreedy D.[facility] [place] Phone: [phone]   Assessment and Plan:     1. Chronic bilateral low back pain without sciatica 2. Somatic dysfunction of cervical region 3. Somatic dysfunction of thoracic region 4. Somatic dysfunction of lumbar region 5. Somatic dysfunction of pelvic region 6. Somatic dysfunction of rib region -Chronic with exacerbation, subsequent visit - Recurrence of multiple musculoskeletal complaints with most prominent being in left mid back radiating to left shoulder blade - Patient has received significant relief with OMT in the past.  Elects for repeat OMT today.  Tolerated well per note below. - Decision today to treat with OMT was based on Physical Exam   After verbal consent patient was treated with HVLA (high velocity low amplitude), ME (muscle energy), FPR (flex positional release), ST (soft tissue), PC/PD (Pelvic Compression/ Pelvic Decompression) techniques in cervical, rib, thoracic, lumbar, and pelvic areas. Patient tolerated the procedure well with improvement in symptoms.  Patient educated on potential side effects of soreness and recommended to rest, hydrate, and use Tylenol as needed for pain control.   Pertinent previous records reviewed include none   Follow Up: 2-week follow-up previously scheduled with Dr. Tamala Julian for repeat OMT   Subjective:    I, Pincus Badder, am serving as a Education administrator for Doctor Glennon Mac  Chief Complaint: neck and back pain   HPI:   11/18/20 Patient is a 70 year old female presenting with neck and back pain. Patient last saw Dr. Tamala Julian on 10/06/20 and had OMT. Today patient states left shdoulder, neck, scapula, and hand. Very painful, sharp with certain movements. Pressure in arm. OT has been working on throat and that when the shoulder began to hurt more   12/02/20 Patient states shoulder remained sharp pain even after the meloxicam, since seeing the PT  and the physical therapist worked on her lower back and the psoas and that had helped her right shoulder after the appointment. Patient states that she s still on the meloxicam for a couple days but patient has a hot flushing feeling since taking it, acid reflux with sore throat, stomach pain right below the sternum, abdominal pain by her belly button. Patient has also been having joint pain at the end of her fingers, increased bladder spasms and puffy hands.  12/23/2020 Patient states having a lot of pain in the upper shoulder area. Last appointment helped lasted about a week had great results. Started having tingling on the inside of both thighs mostly on L  after occupational therapy appt about a week and a half ago. Stopped meloxicam 12/01/20 and the symptoms resolved still have stomach discomfort and mild throat pain. Bright flashing light in R eye posterior vitreous detachment has scarring in that eye has eye appointment next Monday. L wrist and hand grip strength has decreased and it radiates up the arm.  Probably really tense due to life   Relevant Historical Information:  OSA, carpal tunnel post release,  Additional pertinent review of systems negative.  Current Outpatient Medications  Medication Sig Dispense Refill   ascorbic acid (VITAMIN C) 500 MG tablet Take 500 mg by mouth daily.     Cholecalciferol (VITAMIN D3) 5000 units CAPS Take 5,000 Units by mouth. Every other day     Cyanocobalamin (VITAMIN B-12) 1000 MCG SUBL Place 1 tablet under the tongue 3 (three) times a week.     DULoxetine (CYMBALTA) 20 MG capsule Take 1 capsule (20 mg total) by  mouth daily. 30 capsule 0   meloxicam (MOBIC) 7.5 MG tablet Take 1 tablet (7.5 mg total) by mouth daily. 14 tablet 0   Menatetrenone (VITAMIN K2) 100 MCG TABS Take 160 mcg by mouth. Every 6th day. MK-7     Misc Natural Products (TART CHERRY ADVANCED PO) Take 800 mg by mouth daily.     MISC NATURAL PRODUCTS PO Take by mouth. Black currant seed oil      Multiple Vitamins-Minerals (SUPER MULTI-VITAMIN PO) Take 0.5 tablets by mouth daily. Solgar Formula VM-75     Omega-3 Fatty Acids (FISH OIL) 1000 MG CAPS Take 500 mg by mouth. 1 per day     Probiotic Product (PROBIOTIC ADVANCED PO) Take by mouth. Patient states taking 5 billion, one capsule daily. Suprema Dophilus probiotic     Sodium Chloride-Xylitol (XLEAR SINUS CARE SPRAY NA) Place into the nose. 2 sprays at bedtime     SYRINGE-NEEDLE, DISP, 3 ML 25G X 1" 3 ML MISC Inject B12 every 2 weeks. 50 each 0   No current facility-administered medications for this visit.      Objective:     Vitals:   12/23/20 1357  BP: 140/90  Pulse: 91  SpO2: 96%  Weight: 220 lb (99.8 kg)  Height: 4\' 11"  (1.499 m)      Body mass index is 44.43 kg/m.    Physical Exam:     General: Well-appearing, cooperative, sitting comfortably in no acute distress.   OMT Physical Exam:  ASIS Compression Test: Positive Right Cervical: Mildly TTP paraspinal, decreased rotation bilaterally C5-7 Rib: Bilateral elevated first rib with TTP Thoracic: Mildly TTP paraspinal, T4 RLSL, Lumbar: TTP paraspinal, L5 rotated right Pelvis: Right anterior innominate  Electronically signed by:  Benito Mccreedy D.[facility] 3:09 PM 12/23/20

## 2020-12-23 ENCOUNTER — Other Ambulatory Visit: Payer: Self-pay

## 2020-12-23 ENCOUNTER — Ambulatory Visit (INDEPENDENT_AMBULATORY_CARE_PROVIDER_SITE_OTHER): Payer: Medicare Other | Admitting: Sports Medicine

## 2020-12-23 VITALS — BP 140/90 | HR 91 | Ht 59.0 in | Wt 220.0 lb

## 2020-12-23 DIAGNOSIS — M9908 Segmental and somatic dysfunction of rib cage: Secondary | ICD-10-CM | POA: Diagnosis not present

## 2020-12-23 DIAGNOSIS — G8929 Other chronic pain: Secondary | ICD-10-CM | POA: Diagnosis not present

## 2020-12-23 DIAGNOSIS — M9901 Segmental and somatic dysfunction of cervical region: Secondary | ICD-10-CM

## 2020-12-23 DIAGNOSIS — M545 Low back pain, unspecified: Secondary | ICD-10-CM | POA: Diagnosis not present

## 2020-12-23 DIAGNOSIS — M9902 Segmental and somatic dysfunction of thoracic region: Secondary | ICD-10-CM

## 2020-12-23 DIAGNOSIS — M9905 Segmental and somatic dysfunction of pelvic region: Secondary | ICD-10-CM | POA: Diagnosis not present

## 2020-12-23 DIAGNOSIS — M9903 Segmental and somatic dysfunction of lumbar region: Secondary | ICD-10-CM

## 2020-12-23 NOTE — Patient Instructions (Addendum)
Good to see you  Tylenol 500 mg 2 tablets , 2 times a day  Scapular  HEP

## 2020-12-27 DIAGNOSIS — H43811 Vitreous degeneration, right eye: Secondary | ICD-10-CM | POA: Diagnosis not present

## 2020-12-27 DIAGNOSIS — Z961 Presence of intraocular lens: Secondary | ICD-10-CM | POA: Diagnosis not present

## 2020-12-27 DIAGNOSIS — H35411 Lattice degeneration of retina, right eye: Secondary | ICD-10-CM | POA: Diagnosis not present

## 2020-12-27 DIAGNOSIS — M542 Cervicalgia: Secondary | ICD-10-CM | POA: Diagnosis not present

## 2020-12-27 DIAGNOSIS — H53141 Visual discomfort, right eye: Secondary | ICD-10-CM | POA: Diagnosis not present

## 2020-12-27 DIAGNOSIS — H31091 Other chorioretinal scars, right eye: Secondary | ICD-10-CM | POA: Diagnosis not present

## 2020-12-30 ENCOUNTER — Ambulatory Visit: Payer: Medicare Other | Admitting: Family Medicine

## 2020-12-30 DIAGNOSIS — B353 Tinea pedis: Secondary | ICD-10-CM | POA: Diagnosis not present

## 2020-12-30 DIAGNOSIS — L918 Other hypertrophic disorders of the skin: Secondary | ICD-10-CM | POA: Diagnosis not present

## 2020-12-30 DIAGNOSIS — L821 Other seborrheic keratosis: Secondary | ICD-10-CM | POA: Diagnosis not present

## 2020-12-30 DIAGNOSIS — D224 Melanocytic nevi of scalp and neck: Secondary | ICD-10-CM | POA: Diagnosis not present

## 2020-12-30 DIAGNOSIS — L814 Other melanin hyperpigmentation: Secondary | ICD-10-CM | POA: Diagnosis not present

## 2020-12-30 DIAGNOSIS — D1721 Benign lipomatous neoplasm of skin and subcutaneous tissue of right arm: Secondary | ICD-10-CM | POA: Diagnosis not present

## 2020-12-30 DIAGNOSIS — L570 Actinic keratosis: Secondary | ICD-10-CM | POA: Diagnosis not present

## 2021-01-03 DIAGNOSIS — M542 Cervicalgia: Secondary | ICD-10-CM | POA: Diagnosis not present

## 2021-01-06 NOTE — Progress Notes (Signed)
[place] Phone: [phone] Subjective:   Kerry [name], am serving as a scribe for Dr. Hulan [name]. This visit occurred during the SARS-CoV-2 public health emergency.  Safety protocols were in place, including screening questions prior to the visit, additional usage of staff PPE, and extensive cleaning of exam room while observing appropriate contact time as indicated for disinfecting solutions.  I'm seeing this patient by the request  of:  Street, Kerry Mt, MD  CC: Left-sided neck, arm pain and lower back pain  [id number]  Kerry [name] is a 70 y.o. female coming in with complaint of back and neck pain. OMT on 12/23/2020 with Dr. Glennon [name]. Patient states that L SI joint, L scapula, and L cspine, and L spine. Feels that L shoulder ane neck pain have progressed since having surgery on L wrist. Feels she is gripping differently. Pain in scapula is constant. SI joint hurts when R knee pain increases.   Patient has been doing OT. Sometimes had relief and other times did not have any relief.   Patient did try meloxicam. Had side effects so she stopped medication. Did not start Cymbalta.   Also has had some swelling in L foot recently. Had issues in 2017 in this foot/ankle.   Vit D dosage that she needs to be on  Last appointment helped more than any recently.     Medications patient has been prescribed: Meloxicam  Taking:         Reviewed prior external information including notes and imaging from previsou exam, outside providers and external EMR if available.   As well as notes that were available from care everywhere and other healthcare systems.  Past medical history, social, surgical and family history all reviewed in electronic medical record.  [name] pertanent information unless stated regarding to the chief complaint.   Past Medical History:  Diagnosis Date   Abnormal uterine  bleeding    Allergic rhinitis, cause unspecified    Arthritis of knee, right    Bursitis of left shoulder    Cherry hemangioma 07/28/15   vulva   Chronic pain    Dyspareunia    Elevated cholesterol    Elevated uric acid in blood 2021   Fibroid    Foot fracture, left 01/2015   and torn tendons   GERD (gastroesophageal reflux disease)    HTN (hypertension)    Kidney stones    Sleep apnea    Urinary incontinence     Allergies  Allergen Reactions   Molds & Smuts Other (See Comments), Palpitations, Shortness Of Breath and Swelling   Cefixime Swelling   Celebrex [Celecoxib] Swelling   Cobalt Other (See Comments)    Per allergist   Gabapentin Other (See Comments)    Joint pain   Levofloxacin Other (See Comments)    Muscle and joint pain   Molybdenum Other (See Comments)    Per allergist   Other Other (See Comments)    TEQUIN. TEQUIN - JOINT AND MUSCLE PAIN Other Reaction: painful joints TEQUIN. TEQUIN - JOINT AND MUSCLE PAIN Tantal Metal   Penicillins Hives   Robaxin [Methocarbamol]    Tomato Hives   Adhesive [Tape] Rash    Sensitive to EKG leads; sensitive to 49M Micropore tape   Dust Mite Extract Itching, Other (See Comments) and Palpitations   Tetracycline Hcl Rash    Can take Doxy   Tetracyclines & Related Rash     Review of  Systems:  [name] headache, visual changes, nausea, vomiting, diarrhea, constipation, dizziness, abdominal pain, skin rash, fevers, chills, night sweats, weight loss, swollen lymph nodes, body aches, joint swelling, chest pain, shortness of breath, mood changes. POSITIVE muscle aches  Objective  Blood pressure 138/80, pulse 97, height 4\' 11"  (1.499 m), last menstrual period 01/16/2002, SpO2 98 %.   General: [name] apparent distress alert and oriented x3 mood and affect normal, dressed appropriately.  HEENT: Pupils equal, extraocular movements intact  Respiratory: Patient's speak in full sentences and does not appear short of breath  Cardiovascular: [name]  lower extremity edema, non tender, [name] erythema  Antalgic gait Patient's neck exam does have some loss lordosis.  Tenderness to palpation in the paraspinal musculature.  Patient does have tightness noted of the left parascapular area.  Seems to be worse at the inferior angle of the scapula.  Negative Spurling's.  Patient does have good grip strength still noted.  Osteopathic findings   C6 flexed rotated and side bent left T3 extended rotated and side bent left inhaled rib T9 extended rotated and side bent left L2 flexed rotated and side bent right Sacrum left on left       Assessment and Plan:   Low back pain Chronic overall.  Encourage patient to continue to try to stay active where possible.  Patient has had difficulty with different medications.  Seronegative rheumatoid arthritis is still a potential.  Discussed again about the Cymbalta.  Patient wants to hold on taking it until her husband is back potentially.  Follow-up again in 6 to 8 weeks  Left shoulder pain Patient left shoulder pain could be potentially cervical radiculopathy.  Discussed with patient to monitor.  Could consider the medications.  Still think Cymbalta could be beneficial.  Patient is still holding at this time.  Patient did notice maybe the meloxicam helped but unable to do it for a regular amount of time.  Follow-up again in 4 to 6 weeks  Nonallopathic problems  Decision today to treat with OMT was based on Physical Exam  After verbal consent patient was treated with HVLA, ME, FPR techniques in cervical, rib, thoracic, lumbar, and sacral  areas  Patient tolerated the procedure well with improvement in symptoms  Patient given exercises, stretches and lifestyle modifications  See medications in patient instructions if given  Patient will follow up in 4-8 weeks      The above documentation has been reviewed and is accurate and complete Kerry Pulley, DO       Note: This dictation was prepared  with Dragon dictation along with smaller phrase technology. Any transcriptional errors that result from this process are unintentional.

## 2021-01-11 ENCOUNTER — Other Ambulatory Visit: Payer: Self-pay

## 2021-01-11 ENCOUNTER — Encounter: Payer: Self-pay | Admitting: Family Medicine

## 2021-01-11 ENCOUNTER — Ambulatory Visit (INDEPENDENT_AMBULATORY_CARE_PROVIDER_SITE_OTHER): Payer: Medicare Other | Admitting: Family Medicine

## 2021-01-11 VITALS — BP 138/80 | HR 97 | Ht 59.0 in | Wt 217.0 lb

## 2021-01-11 DIAGNOSIS — M545 Low back pain, unspecified: Secondary | ICD-10-CM | POA: Diagnosis not present

## 2021-01-11 DIAGNOSIS — M9904 Segmental and somatic dysfunction of sacral region: Secondary | ICD-10-CM | POA: Diagnosis not present

## 2021-01-11 DIAGNOSIS — G8929 Other chronic pain: Secondary | ICD-10-CM | POA: Diagnosis not present

## 2021-01-11 DIAGNOSIS — M9908 Segmental and somatic dysfunction of rib cage: Secondary | ICD-10-CM | POA: Diagnosis not present

## 2021-01-11 DIAGNOSIS — M9902 Segmental and somatic dysfunction of thoracic region: Secondary | ICD-10-CM

## 2021-01-11 DIAGNOSIS — M25512 Pain in left shoulder: Secondary | ICD-10-CM | POA: Diagnosis not present

## 2021-01-11 DIAGNOSIS — M9903 Segmental and somatic dysfunction of lumbar region: Secondary | ICD-10-CM | POA: Diagnosis not present

## 2021-01-11 DIAGNOSIS — M9901 Segmental and somatic dysfunction of cervical region: Secondary | ICD-10-CM | POA: Diagnosis not present

## 2021-01-11 NOTE — Assessment & Plan Note (Signed)
Patient left shoulder pain could be potentially cervical radiculopathy.  Discussed with patient to monitor.  Could consider the medications.  Still think Cymbalta could be beneficial.  Patient is still holding at this time.  Patient did notice maybe the meloxicam helped but unable to do it for a regular amount of time.  Follow-up again in 4 to 6 weeks

## 2021-01-11 NOTE — Assessment & Plan Note (Signed)
Chronic overall.  Encourage patient to continue to try to stay active where possible.  Patient has had difficulty with different medications.  Seronegative rheumatoid arthritis is still a potential.  Discussed again about the Cymbalta.  Patient wants to hold on taking it until her husband is back potentially.  Follow-up again in 6 to 8 weeks

## 2021-01-11 NOTE — Patient Instructions (Signed)
Let's watch the neck Enjoy peaceful time Do exercises without the band See me in 4-6 weeks

## 2021-01-13 ENCOUNTER — Ambulatory Visit: Payer: Medicare Other | Admitting: Family Medicine

## 2021-01-16 HISTORY — PX: COLONOSCOPY WITH ESOPHAGOGASTRODUODENOSCOPY (EGD): SHX5779

## 2021-02-25 NOTE — Progress Notes (Signed)
[place] Phone: [phone] Subjective:   [name], am serving as a scribe for Dr. Hulan Saas. This visit occurred during the SARS-CoV-2 public health emergency.  Safety protocols were in place, including screening questions prior to the visit, additional usage of staff PPE, and extensive cleaning of exam room while observing appropriate contact time as indicated for disinfecting solutions.  I'm seeing this patient by the request  of:  Street, [name], MD  CC: Back and neck pain follow-up  [id number]  Kerry [name] is a 71 y.o. female coming in with complaint of back and neck pain. OMT on 01/11/2021. Patient states he has done some weeding over the course of the last month and whenever she does it seems to have increasing sharp pain especially in left shoulder and scapular region.  Had severe amount of pain on January 19 through 21.  Had difficulty even lifting her left arm.   Patient also states she has had some bladder spasms that are worse in the last several months.  Wanting to brainstorm what is potentially causing that. Wonders if something with her back could help the bladder.   Patient also had 1 episode where she became lightheaded, hard to breathe with heart racing after taking the trash cans to the street.  Patient states that since then this continues to stay active but usually with activity for the first 10 minutes seems to be harder but then gets easier.  Reviewing patient's chart patient does see gastroenterology again for a 3-year recall for the colonoscopy and has had worsening stomach pain.  Patient is wondering if this is secondary to the back pain or vice versa  Patient is having a retinal surgery February 22 for vitreous detachment.  Patient is concerned because did have an ocular migraine on January 22.  Patient has had a ache in the right eyes since February  2  Medications patient has been prescribed: None        Reviewed prior external information including notes and imaging from previsou exam, outside providers and external EMR if available.   As well as notes that were available from care everywhere and other healthcare systems.  Past medical history, social, surgical and family history all reviewed in electronic medical record.  No pertanent information unless stated regarding to the chief complaint.   Past Medical History:  Diagnosis Date   Abnormal uterine bleeding    Allergic rhinitis, cause unspecified    Arthritis of knee, right    Bursitis of left shoulder    Cherry hemangioma 07/28/15   vulva   Chronic pain    Dyspareunia    Elevated cholesterol    Elevated uric acid in blood 2021   Fibroid    Foot fracture, left 01/2015   and torn tendons   GERD (gastroesophageal reflux disease)    HTN (hypertension)    Kidney stones    Sleep apnea    Urinary incontinence     Allergies  Allergen Reactions   Molds & Smuts Other (See Comments), Palpitations, Shortness Of Breath and Swelling   Cefixime Swelling   Celebrex [Celecoxib] Swelling   Cobalt Other (See Comments)    Per allergist   Gabapentin Other (See Comments)    Joint pain   Levofloxacin Other (See Comments)    Muscle and joint pain   Molybdenum Other (See Comments)    Per allergist   Other Other (See Comments)  TEQUIN. TEQUIN - JOINT AND MUSCLE PAIN Other Reaction: painful joints TEQUIN. TEQUIN - JOINT AND MUSCLE PAIN Tantal Metal   Penicillins Hives   Robaxin [Methocarbamol]    Tomato Hives   Adhesive [Tape] Rash    Sensitive to EKG leads; sensitive to 31M Micropore tape   Dust Mite Extract Itching, Other (See Comments) and Palpitations   Tetracycline Hcl Rash    Can take Doxy   Tetracyclines & Related Rash     Review of Systems:  No headache, visual changes, nausea, vomiting, diarrhea, constipation, dizziness, abdominal pain, skin rash, fevers,  chills, night sweats, weight loss, swollen lymph nodes, body aches, joint swelling, chest pain, shortness of breath, mood changes. POSITIVE muscle aches, mild polyuria, 1 episode of chest discomfort and dizziness.  Objective  Blood pressure 132/80, pulse (!) 103, height 4\' 11"  (1.499 m), weight 212 lb (96.2 kg), last menstrual period 01/16/2002, SpO2 98 %.   General: No apparent distress alert and oriented x3 mood and affect normal, dressed appropriately.  HEENT: Pupils equal, extraocular movements intact  Respiratory: Patient's speak in full sentences and does not appear short of breath  Cardiovascular: Trace lower extremity edema, non tender, no erythema  Low back exam does have loss of lordosis.  Antalgic gait noted.  Discussed icing regimen and home exercises.  Patient does have tightness in the thoracolumbar junction noted as well.  Osteopathic findings  C2 flexed rotated and side bent right C6 flexed rotated and side bent left T3 extended rotated and side bent right inhaled rib T8 extended rotated and side bent left L2 flexed rotated and side bent right Sacrum right on right     Assessment and Plan:  Low back pain Continue chronic pain.  And seems to be multifactorial.  Still seems to have more likely a rheumatoid like disease. Seems to be more of a serum negative rheumatoid arthritis.  Patient also has had elevated uric acid.  Not responding as well to taking the allopurinol on a regular basis but will need to consider this again.  Responding to manipulation.  Discussed all the other possibilities including home exercises, icing regimen,    Nonallopathic problems  Decision today to treat with OMT was based on Physical Exam  After verbal consent patient was treated with HVLA, ME, FPR techniques in cervical, rib, thoracic, lumbar, and sacral  areas  Patient tolerated the procedure well with improvement in symptoms  Patient given exercises, stretches and lifestyle  modifications  See medications in patient instructions if given  Patient will follow up in 4-8 weeks      The above documentation has been reviewed and is accurate and complete [name], DO        Note: This dictation was prepared with Dragon dictation along with smaller phrase technology. Any transcriptional errors that result from this process are unintentional.

## 2021-02-27 ENCOUNTER — Encounter: Payer: Self-pay | Admitting: Family Medicine

## 2021-02-28 ENCOUNTER — Other Ambulatory Visit: Payer: Self-pay

## 2021-02-28 ENCOUNTER — Encounter: Payer: Self-pay | Admitting: Family Medicine

## 2021-02-28 ENCOUNTER — Ambulatory Visit (INDEPENDENT_AMBULATORY_CARE_PROVIDER_SITE_OTHER): Payer: Medicare Other | Admitting: Family Medicine

## 2021-02-28 ENCOUNTER — Ambulatory Visit (INDEPENDENT_AMBULATORY_CARE_PROVIDER_SITE_OTHER): Payer: Medicare Other

## 2021-02-28 VITALS — BP 132/80 | HR 103 | Ht 59.0 in | Wt 212.0 lb

## 2021-02-28 DIAGNOSIS — R0602 Shortness of breath: Secondary | ICD-10-CM

## 2021-02-28 DIAGNOSIS — M9908 Segmental and somatic dysfunction of rib cage: Secondary | ICD-10-CM | POA: Diagnosis not present

## 2021-02-28 DIAGNOSIS — G8929 Other chronic pain: Secondary | ICD-10-CM

## 2021-02-28 DIAGNOSIS — M545 Low back pain, unspecified: Secondary | ICD-10-CM

## 2021-02-28 DIAGNOSIS — M9904 Segmental and somatic dysfunction of sacral region: Secondary | ICD-10-CM

## 2021-02-28 DIAGNOSIS — R5383 Other fatigue: Secondary | ICD-10-CM | POA: Diagnosis not present

## 2021-02-28 DIAGNOSIS — M9902 Segmental and somatic dysfunction of thoracic region: Secondary | ICD-10-CM | POA: Diagnosis not present

## 2021-02-28 DIAGNOSIS — M109 Gout, unspecified: Secondary | ICD-10-CM

## 2021-02-28 DIAGNOSIS — R0609 Other forms of dyspnea: Secondary | ICD-10-CM

## 2021-02-28 DIAGNOSIS — M9903 Segmental and somatic dysfunction of lumbar region: Secondary | ICD-10-CM

## 2021-02-28 DIAGNOSIS — M5136 Other intervertebral disc degeneration, lumbar region: Secondary | ICD-10-CM | POA: Diagnosis not present

## 2021-02-28 DIAGNOSIS — M48061 Spinal stenosis, lumbar region without neurogenic claudication: Secondary | ICD-10-CM | POA: Diagnosis not present

## 2021-02-28 DIAGNOSIS — M2550 Pain in unspecified joint: Secondary | ICD-10-CM | POA: Diagnosis not present

## 2021-02-28 DIAGNOSIS — M9901 Segmental and somatic dysfunction of cervical region: Secondary | ICD-10-CM

## 2021-02-28 LAB — URINALYSIS
Bilirubin Urine: NEGATIVE
Hgb urine dipstick: NEGATIVE
Ketones, ur: NEGATIVE
Leukocytes,Ua: NEGATIVE
Nitrite: NEGATIVE
Specific Gravity, Urine: 1.015 (ref 1.000–1.030)
Total Protein, Urine: NEGATIVE
Urine Glucose: NEGATIVE
Urobilinogen, UA: 0.2 (ref 0.0–1.0)
pH: 6.5 (ref 5.0–8.0)

## 2021-02-28 NOTE — Assessment & Plan Note (Signed)
Will refer patient to cardiology for further evaluation.  Patient has noticed some more increasing tiredness, patient has had some increasing stress but is concerned with patient having an episode that seem to potentially be an arrhythmia.

## 2021-02-28 NOTE — Assessment & Plan Note (Signed)
Patient continues want to avoid the medication.

## 2021-02-28 NOTE — Assessment & Plan Note (Signed)
Continue chronic pain.  And seems to be multifactorial.  Still seems to have more likely a rheumatoid like disease. Seems to be more of a serum negative rheumatoid arthritis.  Patient also has had elevated uric acid.  Not responding as well to taking the allopurinol on a regular basis but will need to consider this again.  Responding to manipulation.  Discussed all the other possibilities including home exercises, icing regimen,

## 2021-02-28 NOTE — Patient Instructions (Signed)
Referral to cardiology-Dr. Harrington Challenger [facility] today Continue what we are doing See me in 4 weeks

## 2021-03-03 ENCOUNTER — Telehealth: Payer: Self-pay | Admitting: Cardiology

## 2021-03-03 NOTE — Telephone Encounter (Signed)
Patient would like to switch from Dr. Agustin Cree to Dr. Harrington Challenger.  Are you both in agreement with this?

## 2021-03-07 DIAGNOSIS — R1033 Periumbilical pain: Secondary | ICD-10-CM | POA: Diagnosis not present

## 2021-03-07 DIAGNOSIS — K219 Gastro-esophageal reflux disease without esophagitis: Secondary | ICD-10-CM | POA: Diagnosis not present

## 2021-03-07 DIAGNOSIS — R1013 Epigastric pain: Secondary | ICD-10-CM | POA: Diagnosis not present

## 2021-03-07 DIAGNOSIS — K5790 Diverticulosis of intestine, part unspecified, without perforation or abscess without bleeding: Secondary | ICD-10-CM | POA: Diagnosis not present

## 2021-03-07 DIAGNOSIS — R1310 Dysphagia, unspecified: Secondary | ICD-10-CM | POA: Diagnosis not present

## 2021-03-07 DIAGNOSIS — K649 Unspecified hemorrhoids: Secondary | ICD-10-CM | POA: Diagnosis not present

## 2021-03-09 DIAGNOSIS — H43393 Other vitreous opacities, bilateral: Secondary | ICD-10-CM | POA: Diagnosis not present

## 2021-03-09 DIAGNOSIS — H3563 Retinal hemorrhage, bilateral: Secondary | ICD-10-CM | POA: Diagnosis not present

## 2021-03-09 DIAGNOSIS — H35411 Lattice degeneration of retina, right eye: Secondary | ICD-10-CM | POA: Diagnosis not present

## 2021-03-09 DIAGNOSIS — H43811 Vitreous degeneration, right eye: Secondary | ICD-10-CM | POA: Diagnosis not present

## 2021-03-09 DIAGNOSIS — H53143 Visual discomfort, bilateral: Secondary | ICD-10-CM | POA: Diagnosis not present

## 2021-03-09 DIAGNOSIS — H35371 Puckering of macula, right eye: Secondary | ICD-10-CM | POA: Diagnosis not present

## 2021-03-09 DIAGNOSIS — H31091 Other chorioretinal scars, right eye: Secondary | ICD-10-CM | POA: Diagnosis not present

## 2021-03-10 DIAGNOSIS — R1011 Right upper quadrant pain: Secondary | ICD-10-CM | POA: Diagnosis not present

## 2021-03-10 DIAGNOSIS — K8020 Calculus of gallbladder without cholecystitis without obstruction: Secondary | ICD-10-CM | POA: Diagnosis not present

## 2021-03-16 DIAGNOSIS — E538 Deficiency of other specified B group vitamins: Secondary | ICD-10-CM | POA: Diagnosis not present

## 2021-03-16 DIAGNOSIS — I110 Hypertensive heart disease with heart failure: Secondary | ICD-10-CM | POA: Diagnosis not present

## 2021-03-16 DIAGNOSIS — Z131 Encounter for screening for diabetes mellitus: Secondary | ICD-10-CM | POA: Diagnosis not present

## 2021-03-16 DIAGNOSIS — N1831 Chronic kidney disease, stage 3a: Secondary | ICD-10-CM | POA: Diagnosis not present

## 2021-03-21 ENCOUNTER — Other Ambulatory Visit: Payer: Self-pay

## 2021-03-21 ENCOUNTER — Encounter: Payer: Self-pay | Admitting: Internal Medicine

## 2021-03-21 ENCOUNTER — Ambulatory Visit (INDEPENDENT_AMBULATORY_CARE_PROVIDER_SITE_OTHER): Payer: Medicare Other | Admitting: Internal Medicine

## 2021-03-21 VITALS — BP 124/62 | HR 84 | Ht 60.0 in | Wt 210.2 lb

## 2021-03-21 DIAGNOSIS — R0609 Other forms of dyspnea: Secondary | ICD-10-CM | POA: Diagnosis not present

## 2021-03-21 DIAGNOSIS — I10 Essential (primary) hypertension: Secondary | ICD-10-CM

## 2021-03-21 DIAGNOSIS — R011 Cardiac murmur, unspecified: Secondary | ICD-10-CM | POA: Diagnosis not present

## 2021-03-21 DIAGNOSIS — E785 Hyperlipidemia, unspecified: Secondary | ICD-10-CM

## 2021-03-21 DIAGNOSIS — R002 Palpitations: Secondary | ICD-10-CM

## 2021-03-21 NOTE — Progress Notes (Addendum)
?Cardiology Office Note:   ? ?Date:  03/21/2021  ? ?ID:  Kerry [name], DOB [birth year], MRN [id number] ? ?PCP:  Street, [name], MD  ?Cardiologist:  Dorris Carnes, MD   ? ?Pt presents for continued care of HTN   Previously followed by Nelta Numbers  Last seen in 2021 ? ? ?History of Present Illness:   ? ?Kerry [name] is a 71 y.o. female past medical history significant for hypertension,   She has previously followed with Karilyn Cota in [place] for years    has complaints of SOB and LE edema as well   Echo in past showed LVEF normal   Diastolid dysfunction    ?The pt comes in today because in Jan she was taking trash  can to street  at about 7:30 am   Admits to being stressed the night before   Woke up at 2   Husband out of town     ?While walking back from streat she got SOB, very lightheaded   Felt wobbly    Presyncopal but not syncope      ? 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 ?She denis CP   Denies palpitations     ? ?Diet: ?Br  Late    Chicken dish     Water   Veggie    ?Lunch     Always has a Smoothy  Veggies   and fruit   Weighs ingredients   ?Evening   Eggs with salad   3 oz strawberries ? ?Past Medical History:  ?Diagnosis Date  ? Abnormal uterine bleeding   ? Allergic rhinitis, cause unspecified   ? Arthritis of knee, right   ? Bursitis of left shoulder   ? 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 vulva  ? Chronic pain   ? Dyspareunia   ? Elevated cholesterol   ? Elevated uric acid in blood 2021  ? Fibroid   ? Foot fracture, left 01/2015  ? and torn tendons  ? GERD (gastroesophageal reflux disease)   ? HTN (hypertension)   ? Kidney stones   ? Sleep apnea   ? Urinary incontinence   ? ? ?Past Surgical History:  ?Procedure Laterality Date  ?  dilatation and curettage  2000  ? [place]  ? CARPAL TUNNEL RELEASE Left 05/21/2020  ? CATARACT  EXTRACTION Bilateral   ? dental implants  2010  ? DENTAL SURGERY    ? ESOPHAGEAL DILATION  2010  ? HAMMER TOE SURGERY    ? rt  ? PELVIC LAPAROSCOPY  1990  ? ROTATOR CUFF REPAIR Right 09/2011  ? Dr. Tamera Punt  ? ROTATOR CUFF REPAIR Left 11/11/2013  ? Dr. Tamera Punt   ? SEPTOPLASTY    ? UMBILICAL HERNIA REPAIR  2010  ? ? ?Current Medications: ?Current Meds  ?Medication Sig  ? ascorbic acid (VITAMIN C) 500 MG tablet Take 250 mg by mouth daily.  ? Cholecalciferol (VITAMIN D3) 5000 units CAPS Take 5,000 Units by mouth. Every other day  ? Cyanocobalamin (VITAMIN B-12) 1000 MCG SUBL Place 1 tablet under the tongue 4 (four) times a week.  ? Menatetrenone (VITAMIN K2) 100 MCG TABS Take 180 mcg by mouth. Every 6th day. MK-7  ? Misc Natural Products (TART CHERRY ADVANCED PO)  Take 800 mg by mouth daily.  ? MISC NATURAL PRODUCTS PO Take by mouth. Black currant seed oil  ? Multiple Vitamins-Minerals (SUPER MULTI-VITAMIN PO) Take 1 capsule by mouth daily. Solgar Formula VM-75  ? Omega-3 Fatty Acids ([employer] FISH OIL HALF-THE-SIZE) 500 MG CAPS Take 500 capsules by mouth daily.  ? Probiotic Product (PROBIOTIC ADVANCED PO) Take by mouth. Patient states taking 5 billion, one capsule daily. Suprema Dophilus probiotic  ? Sodium Chloride-Xylitol (XLEAR SINUS CARE SPRAY NA) Place into the nose. 2 sprays at bedtime  ?  ? ?Allergies:   Meloxicam, Molds & smuts, Cefixime, Celebrex [celecoxib], Cobalt, Gabapentin, Levofloxacin, Molybdenum, Other, Penicillins, Robaxin [methocarbamol], Tomato, Adhesive [tape], Dust mite extract, Tetracycline hcl, and Tetracyclines & related  ? ?Social History  ? ?Socioeconomic History  ? Marital status: Married  ?  Spouse name: Not on file  ? Number of children: Not on file  ? Years of education: Not on file  ? Highest education level: Not on file  ?Occupational History  ? Occupation: Retired  ?Tobacco Use  ? Smoking status: Never  ? Smokeless tobacco: Never  ?Vaping Use  ? Vaping Use: Never used  ?Substance and  Sexual Activity  ? Alcohol use: No  ?  Alcohol/week: 0.0 standard drinks  ? Drug use: No  ? Sexual activity: Not Currently  ?  Partners: Male  ?  Birth control/protection: Post-menopausal  ?Other Topics Concern  ? Not on file  ?Social History Narrative  ? Not on file  ? ?Social Determinants of Health  ? ?Financial Resource Strain: Not on file  ?Food Insecurity: Not on file  ?Transportation Needs: Not on file  ?Physical Activity: Not on file  ?Stress: Not on file  ?Social Connections: Not on file  ?  ? ?Family History: ?The patient's family history includes Arthritis in her sister; Crohn's disease in her brother; Diabetes in her mother; Glaucoma in her brother; Heart attack in her father; Kidney failure in her brother; Ovarian cancer (age of onset: 30) in her sister; Rheumatologic disease in her mother and sister; Ulcerative colitis in her brother. ?ROS:   ?Please see the history of present illness.    ?All 14 point review of systems negative except as described per history of present illness ? ?EKGs/Labs/Other Studies Reviewed:   ? ?EKG   SR 84 ? ?Recent Labs: ?No results found for requested labs within last 8760 hours.  ?Recent Lipid Panel ?   ?Component Value Date/Time  ? CHOL 257 (H) 02/09/2011 1027  ? TRIG 136 02/09/2011 1027  ? HDL 46 02/09/2011 1027  ? CHOLHDL 5.6 02/09/2011 1027  ? VLDL 27 02/09/2011 1027  ? LDLCALC 184 (H) 02/09/2011 1027  ? ? ?Physical Exam:   ? ?VS:  BP 124/62 (BP Location: Left Arm, Patient Position: Sitting, Cuff Size: Large)   Pulse 84   Ht 5' (1.524 m)   Wt 210 lb 3.2 oz (95.3 kg)   LMP 01/16/2002   SpO2 95%   BMI 41.05 kg/m?    ? ?Wt Readings from Last 3 Encounters:  ?03/21/21 210 lb 3.2 oz (95.3 kg)  ?02/28/21 212 lb (96.2 kg)  ?01/11/21 217 lb (98.4 kg)  ?  ? ?GEN: Obese 71 yo in no acute distress ?HEENT: Normal ?NECK: No JVD; No carotid bruits ?LYMPHATICS: No lymphadenopathy ?CARDIAC: RRR, no murmurs, ?RESPIRATORY:  Clear to auscultation without rales, wheezing or rhonchi   ?ABDOMEN: Soft, non-tender, non-distended ?MUSCULOSKELETAL:  No edema; No deformity  ?SKIN: Warm and dry ?LOWER EXTREMITIES:  no swelling ?NEUROLOGIC:  Alert and oriented x 3 ?PSYCHIATRIC:  Normal affect  ? ? ?Impression    ? ?1  SPELL   Pt had spell in Jan of dizziness, SOB      Isolated   Has not had any more of them     I would follow   Encouraged hydration   Encouraged activity     ?Will get echo  ? ?2  HTN  BP is controlled on current regimen   I would continuei ? ?3 Palpitations  Denies ? ?4  HL   Last lipids from March   chol 222  HDL 47   LDL in past was 155    It was recomm that she take a statin     ?I would recomm a Ca score CT to determine plaque burden and objectify disease process.   WOuld help define goals     ? ?5  Diet.  Reviewed  I believe she would do better to stop smoothies   Destroys fiber  Tend to consume more (though she says she weighs)    Eat whole fruit, veggie    ? ?Plan for f/u  in  1 year  ? ?Medication Adjustments/Labs and Tests Ordered: ?Current medicines are reviewed at length with the patient today.  Concerns regarding medicines are outlined above.  ?No orders of the defined types were placed in this encounter. ? ?Medication changes: No orders of the defined types were placed in this encounter. ? ? ?Signed, ?Park Liter, MD, [facility] ?03/21/2021 10:13 AM    ?[place] ?

## 2021-03-21 NOTE — Patient Instructions (Signed)
Medication Instructions:  ?Your physician recommends that you continue on your current medications as directed. Please refer to the Current Medication list given to you today. ? ?*If you need a refill on your cardiac medications before your next appointment, please call your pharmacy* ? ? ?Lab Work: ?none ?If you have labs (blood work) drawn today and your tests are completely normal, you will receive your results only by: ?MyChart Message (if you have MyChart) OR ?A paper copy in the mail ?If you have any lab test that is abnormal or we need to change your treatment, we will call you to review the results. ? ? ?Testing/Procedures:Your physician has requested that you have an echocardiogram. Echocardiography is a painless test that uses sound waves to create images of your heart. It provides your doctor with information about the size and shape of your heart and how well your heart?s chambers and valves are working. This procedure takes approximately one hour. There are no restrictions for this procedure. ? ?Calcium Score  ? ? ?Follow-Up: ?At [facility], you and your health needs are our priority.  As part of our continuing mission to provide you with exceptional heart care, we have created designated Provider Care Teams.  These Care Teams include your primary Cardiologist (physician) and Advanced Practice Providers (APPs -  Physician Assistants and Nurse Practitioners) who all work together to provide you with the care you need, when you need it. ? ?We recommend signing up for the patient portal called "MyChart".  Sign up information is provided on this After Visit Summary.  MyChart is used to connect with patients for Virtual Visits (Telemedicine).  Patients are able to view lab/test results, encounter notes, upcoming appointments, etc.  Non-urgent messages can be sent to your provider as well.   ?To learn more about what you can do with MyChart, go to [email or web address].   ? ?Your next appointment:   ?1  year(s) ? ?The format for your next appointment:   ?In Person ? ?Provider:   ?Dr. Dorris Carnes  ? ?If primary card or EP is not listed click here to update    :1}  ? ? ?Other Instructions ?  ?

## 2021-03-24 DIAGNOSIS — K219 Gastro-esophageal reflux disease without esophagitis: Secondary | ICD-10-CM | POA: Diagnosis not present

## 2021-03-24 DIAGNOSIS — E669 Obesity, unspecified: Secondary | ICD-10-CM | POA: Diagnosis not present

## 2021-03-24 DIAGNOSIS — K222 Esophageal obstruction: Secondary | ICD-10-CM | POA: Diagnosis not present

## 2021-03-24 DIAGNOSIS — G4733 Obstructive sleep apnea (adult) (pediatric): Secondary | ICD-10-CM | POA: Diagnosis not present

## 2021-03-24 DIAGNOSIS — K449 Diaphragmatic hernia without obstruction or gangrene: Secondary | ICD-10-CM | POA: Diagnosis not present

## 2021-03-24 DIAGNOSIS — D126 Benign neoplasm of colon, unspecified: Secondary | ICD-10-CM | POA: Diagnosis not present

## 2021-03-24 DIAGNOSIS — Z86010 Personal history of colonic polyps: Secondary | ICD-10-CM | POA: Diagnosis not present

## 2021-03-24 DIAGNOSIS — D122 Benign neoplasm of ascending colon: Secondary | ICD-10-CM | POA: Diagnosis not present

## 2021-03-24 DIAGNOSIS — R1013 Epigastric pain: Secondary | ICD-10-CM | POA: Diagnosis not present

## 2021-03-24 DIAGNOSIS — D124 Benign neoplasm of descending colon: Secondary | ICD-10-CM | POA: Diagnosis not present

## 2021-03-24 DIAGNOSIS — Z1211 Encounter for screening for malignant neoplasm of colon: Secondary | ICD-10-CM | POA: Diagnosis not present

## 2021-03-24 DIAGNOSIS — K5730 Diverticulosis of large intestine without perforation or abscess without bleeding: Secondary | ICD-10-CM | POA: Diagnosis not present

## 2021-03-24 DIAGNOSIS — K644 Residual hemorrhoidal skin tags: Secondary | ICD-10-CM | POA: Diagnosis not present

## 2021-03-24 DIAGNOSIS — K635 Polyp of colon: Secondary | ICD-10-CM | POA: Diagnosis not present

## 2021-03-24 DIAGNOSIS — R1310 Dysphagia, unspecified: Secondary | ICD-10-CM | POA: Diagnosis not present

## 2021-03-29 NOTE — Progress Notes (Signed)
?Charlann Boxer D.O. ?[facility] ?[place] ?Phone: [phone] ?Subjective:   ?I, Kerry [name], am serving as a scribe for Dr. Hulan Saas. ? ?This visit occurred during the SARS-CoV-2 public health emergency.  Safety protocols were in place, including screening questions prior to the visit, additional usage of staff PPE, and extensive cleaning of exam room while observing appropriate contact time as indicated for disinfecting solutions.  ?I'm seeing this patient by the request  of:  Street, Kerry Mt, MD ? ?CC: neck pain and back pain  ? ?[id number]  ?Kerry [name] is a 71 y.o. female coming in with complaint of back and neck pain. OMT 02/28/2021. Patient states that her pain lessened after last adjustment. Has not been back to OT for fascial work.  ? ?Having B shoulder pain after having colonoscopy last week. Patient also notes stumbling over a wire and landed on R knee. Also said that BP cuff was used on R calf and feels this contributed to her pain as well.  Please see patient's MyChart message for further relation. ?Did reduce in the entirety and discussed it in the office visit today. ? ?Medications patient has been prescribed: None ? ? ? ? ?  ? ? ? ? ?Reviewed prior external information including notes and imaging from previsou exam, outside providers and external EMR if available.  ? ?As well as notes that were available from care everywhere and other healthcare systems. ? ?Past medical history, social, surgical and family history all reviewed in electronic medical record.  No pertanent information unless stated regarding to the chief complaint.  ? ?Past Medical History:  ?Diagnosis Date  ? Abnormal uterine bleeding   ? Allergic rhinitis, cause unspecified   ? Arthritis of knee, right   ? Bursitis of left shoulder   ? 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 vulva  ? Chronic pain   ? Dyspareunia   ? Elevated cholesterol   ? Elevated uric acid in blood 2021  ?  Fibroid   ? Foot fracture, left 01/2015  ? and torn tendons  ? GERD (gastroesophageal reflux disease)   ? HTN (hypertension)   ? Kidney stones   ? Sleep apnea   ? Urinary incontinence   ?  ?Allergies  ?Allergen Reactions  ? Meloxicam Other (See Comments)  ?  Made patient have stomach pain ,sore throat, joint pains, increase body temp.  ? Molds & Smuts Other (See Comments), Palpitations, Shortness Of Breath and Swelling  ? Cefixime Swelling  ? Celebrex [Celecoxib] Swelling  ? Cobalt Other (See Comments)  ?  Per allergist  ? Gabapentin Other (See Comments)  ?  Joint pain  ? Levofloxacin Other (See Comments)  ?  Muscle and joint pain  ? Molybdenum Other (See Comments)  ?  Per allergist  ? Other Other (See Comments)  ?  TEQUIN. TEQUIN - JOINT AND MUSCLE PAIN ?Other Reaction: painful joints ?TEQUIN. TEQUIN - JOINT AND MUSCLE PAIN ?Tantal Metal  ? Penicillins Hives  ? Robaxin [Methocarbamol]   ? Tomato Hives  ? Adhesive [Tape] Rash  ?  Sensitive to EKG leads; sensitive to 16M Micropore tape  ? Dust Mite Extract Itching, Other (See Comments) and Palpitations  ? Tetracycline Hcl Rash  ?  Can take Doxy  ? Tetracyclines & Related Rash  ? ? ? ?Review of Systems: ? No headache, visual changes, nausea, vomiting, diarrhea, constipation, dizziness, abdominal pain, skin rash, fevers, chills, night sweats, weight loss, swollen lymph nodes,  body aches, joint swelling, chest pain, shortness of breath, mood changes. POSITIVE muscle aches ? ?Objective  ?Blood pressure (!) 142/90, pulse 79, height 5' (1.524 m), weight 210 lb (95.3 kg), last menstrual period 01/16/2002, SpO2 98 %. ?  ?General: No apparent distress alert and oriented x3 mood and affect normal, dressed appropriately.  ?HEENT: Pupils equal, extraocular movements intact  ?Respiratory: Patient's speak in full sentences and does not appear short of breath  ?Cardiovascular: No lower extremity edema, non tender, no erythema  ?Gait antalgic gait noted. ?MSK: Continues to have  arthritic changes of multiple joints.  Patient does have some mild swelling noted of the MCP joints. ? ?Osteopathic findings ? ?C2 flexed rotated and side bent right ?C5 flexed rotated and side bent left ?T3 extended rotated and side bent left inhaled rib ?T8 extended rotated and side bent left ?L3 flexed rotated and side bent right ?Sacrum right on right ? ? ? ? ?  ?Assessment and Plan: ? ?Low back pain ?Chronic, with mild exacerbation again.  Likely no significant radicular symptoms.  Patient has had some difficulty with this and has had more of a shoulder pain.  Seem to be more left greater than right from time to time but now seems to be more potentially right-sided.  Patient was found to have gallstones and will be following up with gastroenterology and potential surgery.  Follow-up with me again in 6 weeks regularly.  Total time after reviewing patient's notes and discussing with her greater than 35 minutes ?  ? ?Nonallopathic problems ? ?Decision today to treat with OMT was based on Physical Exam ? ?After verbal consent patient was treated with HVLA, ME, FPR techniques in cervical, rib, thoracic, lumbar, and sacral  areas ? ?Patient tolerated the procedure well with improvement in symptoms ? ?Patient given exercises, stretches and lifestyle modifications ? ?See medications in patient instructions if given ? ?Patient will follow up in 4-8 weeks ? ?  ? ? ?The above documentation has been reviewed and is accurate and complete [name], DO ? ? ? ?  ? ? Note: This dictation was prepared with Dragon dictation along with smaller phrase technology. Any transcriptional errors that result from this process are unintentional.    ?  ?  ? ?

## 2021-03-30 ENCOUNTER — Encounter: Payer: Self-pay | Admitting: Family Medicine

## 2021-03-30 ENCOUNTER — Ambulatory Visit (INDEPENDENT_AMBULATORY_CARE_PROVIDER_SITE_OTHER): Payer: Medicare Other | Admitting: Family Medicine

## 2021-03-30 ENCOUNTER — Other Ambulatory Visit: Payer: Self-pay

## 2021-03-30 VITALS — BP 142/90 | HR 79 | Ht 60.0 in | Wt 210.0 lb

## 2021-03-30 DIAGNOSIS — M9902 Segmental and somatic dysfunction of thoracic region: Secondary | ICD-10-CM | POA: Diagnosis not present

## 2021-03-30 DIAGNOSIS — M9901 Segmental and somatic dysfunction of cervical region: Secondary | ICD-10-CM | POA: Diagnosis not present

## 2021-03-30 DIAGNOSIS — G8929 Other chronic pain: Secondary | ICD-10-CM | POA: Diagnosis not present

## 2021-03-30 DIAGNOSIS — M9903 Segmental and somatic dysfunction of lumbar region: Secondary | ICD-10-CM

## 2021-03-30 DIAGNOSIS — M9904 Segmental and somatic dysfunction of sacral region: Secondary | ICD-10-CM

## 2021-03-30 DIAGNOSIS — M545 Low back pain, unspecified: Secondary | ICD-10-CM

## 2021-03-30 DIAGNOSIS — M9908 Segmental and somatic dysfunction of rib cage: Secondary | ICD-10-CM | POA: Diagnosis not present

## 2021-03-30 NOTE — Patient Instructions (Signed)
Thanks for interesting work today ?I would not avoid blender ?See me in 6 weeks ?

## 2021-03-31 NOTE — Assessment & Plan Note (Signed)
Chronic, with mild exacerbation again.  Likely no significant radicular symptoms.  Patient has had some difficulty with this and has had more of a shoulder pain.  Seem to be more left greater than right from time to time but now seems to be more potentially right-sided.  Patient was found to have gallstones and will be following up with gastroenterology and potential surgery.  Follow-up with me again in 6 weeks regularly.  Total time after reviewing patient's notes and discussing with her greater than 35 minutes ?

## 2021-04-08 ENCOUNTER — Ambulatory Visit (HOSPITAL_COMMUNITY): Payer: Medicare Other | Attending: Internal Medicine

## 2021-04-08 ENCOUNTER — Other Ambulatory Visit: Payer: Medicare Other

## 2021-04-08 ENCOUNTER — Other Ambulatory Visit: Payer: Self-pay

## 2021-04-08 DIAGNOSIS — R002 Palpitations: Secondary | ICD-10-CM | POA: Insufficient documentation

## 2021-04-08 DIAGNOSIS — I10 Essential (primary) hypertension: Secondary | ICD-10-CM | POA: Insufficient documentation

## 2021-04-08 DIAGNOSIS — R011 Cardiac murmur, unspecified: Secondary | ICD-10-CM | POA: Insufficient documentation

## 2021-04-08 DIAGNOSIS — R0609 Other forms of dyspnea: Secondary | ICD-10-CM | POA: Insufficient documentation

## 2021-04-08 DIAGNOSIS — E785 Hyperlipidemia, unspecified: Secondary | ICD-10-CM | POA: Diagnosis not present

## 2021-04-08 LAB — ECHOCARDIOGRAM COMPLETE
AR max vel: 1.36 cm2
AV Area VTI: 1.46 cm2
AV Area mean vel: 1.44 cm2
AV Mean grad: 11 mmHg
AV Peak grad: 20.9 mmHg
Ao pk vel: 2.29 m/s
Area-P 1/2: 3.3 cm2
P 1/2 time: 429 msec
S' Lateral: 2.7 cm

## 2021-04-13 ENCOUNTER — Telehealth: Payer: Self-pay | Admitting: Internal Medicine

## 2021-04-13 NOTE — Telephone Encounter (Signed)
Patient was returning call for results, but said she will try again tomorrow. She wont be by the phone this evening. Please advise ?

## 2021-04-14 NOTE — Telephone Encounter (Signed)
Fay Records, MD  ?04/11/2021  4:36 PM EDT   ?  ?Echo looks good  ?LV and RV pumping function are normal  ?Overall normal valve function   ? ?The patient has been notified of the result and verbalized understanding.  All questions (if any) were answered. ?Darrell Jewel, RN 04/14/2021 3:42 PM   ?

## 2021-05-06 NOTE — Progress Notes (Signed)
?Kerry [name] D.O. ?[facility] ?[place] ?Phone: [phone] ?Subjective:   ?I, Kerry [name], am serving as a Education administrator for Dr. Hulan Saas. ?This visit occurred during the SARS-CoV-2 public health emergency.  Safety protocols were in place, including screening questions prior to the visit, additional usage of staff PPE, and extensive cleaning of exam room while observing appropriate contact time as indicated for disinfecting solutions.  ? ?I'm seeing this patient by the request  of:  Street, [name], MD ? ?CC: Back and neck pain follow-up ? ?[id number]  ?Kerry [name] is a 71 y.o. female coming in with complaint of back and neck pain. OMT on 03/30/2021. Patient states same per usual. Not much relief in the upper left. Trouble right arm abduction. No other complaints. ? ?Medications patient has been prescribed:  ? ?Taking: ? ? ?  ? ? ? ? ?Reviewed prior external information including notes and imaging from previsou exam, outside providers and external EMR if available.  ? ?As well as notes that were available from care everywhere and other healthcare systems. ? ?Past medical history, social, surgical and family history all reviewed in electronic medical record.  No pertanent information unless stated regarding to the chief complaint.  ? ?Past Medical History:  ?Diagnosis Date  ? Abnormal uterine bleeding   ? Allergic rhinitis, cause unspecified   ? Arthritis of knee, right   ? Bursitis of left shoulder   ? 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 vulva  ? Chronic pain   ? Dyspareunia   ? Elevated cholesterol   ? Elevated uric acid in blood 2021  ? Fibroid   ? Foot fracture, left 01/2015  ? and torn tendons  ? GERD (gastroesophageal reflux disease)   ? HTN (hypertension)   ? Kidney stones   ? Sleep apnea   ? Urinary incontinence   ?  ?Allergies  ?Allergen Reactions  ? Meloxicam Other (See Comments)  ?  Made patient have stomach pain ,sore throat, joint pains, increase  body temp.  ? Molds & Smuts Other (See Comments), Palpitations, Shortness Of Breath and Swelling  ? Cefixime Swelling  ? Celebrex [Celecoxib] Swelling  ? Cobalt Other (See Comments)  ?  Per allergist  ? Gabapentin Other (See Comments)  ?  Joint pain  ? Levofloxacin Other (See Comments)  ?  Muscle and joint pain  ? Molybdenum Other (See Comments)  ?  Per allergist  ? Other Other (See Comments)  ?  TEQUIN. TEQUIN - JOINT AND MUSCLE PAIN ?Other Reaction: painful joints ?TEQUIN. TEQUIN - JOINT AND MUSCLE PAIN ?Tantal Metal  ? Penicillins Hives  ? Robaxin [Methocarbamol]   ? Tomato Hives  ? Adhesive [Tape] Rash  ?  Sensitive to EKG leads; sensitive to 88M Micropore tape  ? Dust Mite Extract Itching, Other (See Comments) and Palpitations  ? Tetracycline Hcl Rash  ?  Can take Doxy  ? Tetracyclines & Related Rash  ? ? ? ?Review of Systems: ? No headache, visual changes, nausea, vomiting, diarrhea, constipation, dizziness, abdominal pain, skin rash, fevers, chills, night sweats, weight loss, swollen lymph nodes, body aches, joint swelling, chest pain, shortness of breath, mood changes. POSITIVE muscle aches ? ?Objective  ?Blood pressure (!) 160/88, pulse 100, height 5' (1.524 m), weight 209 lb (94.8 kg), last menstrual period 01/16/2002, SpO2 96 %. ?  ?General: No apparent distress alert and oriented x3 mood and affect normal, dressed appropriately.  ?HEENT: Pupils equal, extraocular movements intact  ?  Respiratory: Patient's speak in full sentences and does not appear short of breath  ?Cardiovascular: No lower extremity edema, non tender, no erythema  ?Back exam still has significant loss of lordosis.  Difficulty with walking secondary to the severity of arthritic changes of the knees.  Patient does have poor core strength noted.  Neck exam limited range of motion especially with sidebending bilaterally. ? ?Osteopathic findings ? ?C2 flexed rotated and side bent right ?C5 flexed rotated and side bent left ?T3 extended rotated  and side bent right inhaled rib ?T9 extended rotated and side bent left inhaled rib ?L1 flexed rotated and side bent right ?L5 flexed rotated and side bent left ?Sacrum right on right ? ? ? ? ?  ?Assessment and Plan: ? ?Chronic pain ?Continues to have unfortunately chronic pain overall.  Continues to have good days and bad days.  Does respond to manipulation.  Patient is continuing to try to be active but finds that it is highly difficult sometimes.  Still patient is very wary of any type of joint replacement secondary to the difficulties patient had with her implants for her teeth.  Patient will continue to try to stay active where possible and worsening pain will need to consider the possibility of injections.  Patient will follow-up with me again in 6 to 8 weeks otherwise.  ? ?Nonallopathic problems ? ?Decision today to treat with OMT was based on Physical Exam ? ?After verbal consent patient was treated with HVLA, ME, FPR techniques in cervical, rib, thoracic, lumbar, and sacral  areas ? ?Patient tolerated the procedure well with improvement in symptoms ? ?Patient given exercises, stretches and lifestyle modifications ? ?See medications in patient instructions if given ? ?Patient will follow up in 4-8 weeks ? ?  ? ? ?The above documentation has been reviewed and is accurate and complete [name], DO ? ? ? ?  ? ? Note: This dictation was prepared with Dragon dictation along with smaller phrase technology. Any transcriptional errors that result from this process are unintentional.    ?  ?  ? ?

## 2021-05-09 ENCOUNTER — Ambulatory Visit (INDEPENDENT_AMBULATORY_CARE_PROVIDER_SITE_OTHER): Payer: Medicare Other | Admitting: Family Medicine

## 2021-05-09 VITALS — BP 160/88 | HR 100 | Ht 60.0 in | Wt 209.0 lb

## 2021-05-09 DIAGNOSIS — M9902 Segmental and somatic dysfunction of thoracic region: Secondary | ICD-10-CM | POA: Diagnosis not present

## 2021-05-09 DIAGNOSIS — M9908 Segmental and somatic dysfunction of rib cage: Secondary | ICD-10-CM

## 2021-05-09 DIAGNOSIS — G894 Chronic pain syndrome: Secondary | ICD-10-CM

## 2021-05-09 DIAGNOSIS — M9903 Segmental and somatic dysfunction of lumbar region: Secondary | ICD-10-CM

## 2021-05-09 DIAGNOSIS — M9901 Segmental and somatic dysfunction of cervical region: Secondary | ICD-10-CM | POA: Diagnosis not present

## 2021-05-09 DIAGNOSIS — M9904 Segmental and somatic dysfunction of sacral region: Secondary | ICD-10-CM

## 2021-05-09 NOTE — Patient Instructions (Signed)
Good to see you! ?No yard work today ?Stretch and stay hydrated ?Keep doing what you're doing ?See you again in 5-6 weeks ?

## 2021-05-10 NOTE — Assessment & Plan Note (Signed)
Continues to have unfortunately chronic pain overall.  Continues to have good days and bad days.  Does respond to manipulation.  Patient is continuing to try to be active but finds that it is highly difficult sometimes.  Still patient is very wary of any type of joint replacement secondary to the difficulties patient had with her implants for her teeth.  Patient will continue to try to stay active where possible and worsening pain will need to consider the possibility of injections.  Patient will follow-up with me again in 6 to 8 weeks otherwise. ?

## 2021-05-11 ENCOUNTER — Ambulatory Visit: Payer: Medicare Other | Admitting: Family Medicine

## 2021-05-11 DIAGNOSIS — K219 Gastro-esophageal reflux disease without esophagitis: Secondary | ICD-10-CM | POA: Diagnosis not present

## 2021-05-11 DIAGNOSIS — R1310 Dysphagia, unspecified: Secondary | ICD-10-CM | POA: Diagnosis not present

## 2021-05-11 DIAGNOSIS — K5790 Diverticulosis of intestine, part unspecified, without perforation or abscess without bleeding: Secondary | ICD-10-CM | POA: Diagnosis not present

## 2021-05-11 DIAGNOSIS — R1013 Epigastric pain: Secondary | ICD-10-CM | POA: Diagnosis not present

## 2021-06-14 NOTE — Progress Notes (Unsigned)
[place] [facility] [place] Phone: [phone] Subjective:   Kerry [name], am serving as a scribe for Dr. Hulan Saas.   I'm seeing this patient by the request  of:  Street, [name], MD  CC: back and neck pain   IOX:[id number]  Kerry [name] is a 71 y.o. female coming in with complaint of back and neck pain. OMT on 05/09/2021. Patient states that she continues to have L cervical and scapula pain.   Patient also notes tender spot on scalp on R side. Hurts to comb hair and weight of hair puts pressure on this area making it uncomfortable.   Also having R knee pain that is exacerbated with excessive walking. Using Body Helix brace that has been helpful. Painful anterior medial.   Medications patient has been prescribed: None  Taking:         Reviewed prior external information including notes and imaging from previsou exam, outside providers and external EMR if available.   As well as notes that were available from care everywhere and other healthcare systems.  Past medical history, social, surgical and family history all reviewed in electronic medical record.  [name] pertanent information unless stated regarding to the chief complaint.   Past Medical History:  Diagnosis Date   Abnormal uterine bleeding    Allergic rhinitis, cause unspecified    Arthritis of knee, right    Bursitis of left shoulder    Cherry hemangioma 07/28/15   vulva   Chronic pain    Dyspareunia    Elevated cholesterol    Elevated uric acid in blood 2021   Fibroid    Foot fracture, left 01/2015   and torn tendons   GERD (gastroesophageal reflux disease)    HTN (hypertension)    Kidney stones    Sleep apnea    Urinary incontinence     Allergies  Allergen Reactions   Meloxicam Other (See Comments)    Made patient have stomach pain ,sore throat, joint pains, increase body temp.   Molds & Smuts Other (See Comments),  Palpitations, Shortness Of Breath and Swelling   Cefixime Swelling   Celebrex [Celecoxib] Swelling   Cobalt Other (See Comments)    Per allergist   Gabapentin Other (See Comments)    Joint pain   Levofloxacin Other (See Comments)    Muscle and joint pain   Molybdenum Other (See Comments)    Per allergist   Other Other (See Comments)    TEQUIN. TEQUIN - JOINT AND MUSCLE PAIN Other Reaction: painful joints TEQUIN. TEQUIN - JOINT AND MUSCLE PAIN Tantal Metal   Penicillins Hives   Robaxin [Methocarbamol]    Tomato Hives   Adhesive [Tape] Rash    Sensitive to EKG leads; sensitive to 56M Micropore tape   Dust Mite Extract Itching, Other (See Comments) and Palpitations   Tetracycline Hcl Rash    Can take Doxy   Tetracyclines & Related Rash     Review of Systems:  [name] headache, visual changes, nausea, vomiting, diarrhea, constipation, dizziness, abdominal pain, skin rash, fevers, chills, night sweats, weight loss, swollen lymph nodes, chest pain, shortness of breath, mood changes. POSITIVE muscle aches, body aches, joint swelling, tingling of the scalp  Objective  Blood pressure (!) 154/92, pulse 92, height 5' (1.524 m), weight 211 lb (95.7 kg), last menstrual period 01/16/2002, SpO2 97 %.   General: [name] apparent distress alert and oriented x3 mood and affect normal, dressed appropriately.  HEENT: Pupils equal, extraocular movements intact  Respiratory: Patient's speak in full sentences and does not appear short of breath  Cardiovascular: [name] lower extremity edema, non tender, [name] erythema  Back exam does have some loss of lordosis.  Tenderness to palpation in the paraspinal musculature.  Patient does have significant tightness with FABER test and straight leg test.  [name] radicular symptoms.  Right knee does have effusion noted.  Crepitus noted.  Patient does have small effusions of the MCP joints of the hand.  Osteopathic findings  C2 flexed rotated and side bent right C7 flexed rotated  and side bent left T3 extended rotated and side bent right inhaled rib T6 extended rotated and side bent left L2 flexed rotated and side bent right Sacrum right on right     Assessment and Plan:  Arthritis of knee, degenerative Patient has had injections previously.  Would like to consider the possibility of PRP and we can set up at another follow-up.  Seems to be more right greater than left at the moment.  Discussed cash payment. Patient would like to wait until after her children are in town due to her having multiple different allergies and difficulties with the procedure sometimes.  Follow-up with me again in 6 weeks  Chronic pain Patient continues to have the chronic pain.  Discussed with patient again that and that we do think that this is more like a seronegative rheumatoid arthritis but still does not want to change treatment options.  We have discussed that I do feel Cymbalta would be beneficial in the long run but patient still has not started this either.  At this moment we will continue with the continues continued therapy. Total time reviewing patient's chart, discussing with patient 33 minutes.    Nonallopathic problems  Decision today to treat with OMT was based on Physical Exam  After verbal consent patient was treated with  ME, FPR techniques in cervical, rib, thoracic, lumbar, and sacral  areas  Patient tolerated the procedure well with improvement in symptoms  Patient given exercises, stretches and lifestyle modifications  See medications in patient instructions if given  Patient will follow up in 4-8 weeks      The above documentation has been reviewed and is accurate and complete [name], DO        Note: This dictation was prepared with Dragon dictation along with smaller phrase technology. Any transcriptional errors that result from this process are unintentional.

## 2021-06-15 ENCOUNTER — Ambulatory Visit (INDEPENDENT_AMBULATORY_CARE_PROVIDER_SITE_OTHER): Payer: Medicare Other | Admitting: Family Medicine

## 2021-06-15 ENCOUNTER — Encounter: Payer: Self-pay | Admitting: Family Medicine

## 2021-06-15 ENCOUNTER — Ambulatory Visit: Payer: Medicare Other | Admitting: Internal Medicine

## 2021-06-15 VITALS — BP 154/92 | HR 92 | Ht 60.0 in | Wt 211.0 lb

## 2021-06-15 DIAGNOSIS — M9902 Segmental and somatic dysfunction of thoracic region: Secondary | ICD-10-CM | POA: Diagnosis not present

## 2021-06-15 DIAGNOSIS — G894 Chronic pain syndrome: Secondary | ICD-10-CM | POA: Diagnosis not present

## 2021-06-15 DIAGNOSIS — M9908 Segmental and somatic dysfunction of rib cage: Secondary | ICD-10-CM

## 2021-06-15 DIAGNOSIS — M9904 Segmental and somatic dysfunction of sacral region: Secondary | ICD-10-CM

## 2021-06-15 DIAGNOSIS — M170 Bilateral primary osteoarthritis of knee: Secondary | ICD-10-CM

## 2021-06-15 DIAGNOSIS — M9903 Segmental and somatic dysfunction of lumbar region: Secondary | ICD-10-CM

## 2021-06-15 DIAGNOSIS — M9901 Segmental and somatic dysfunction of cervical region: Secondary | ICD-10-CM

## 2021-06-15 NOTE — Patient Instructions (Addendum)
Good to see you! Sorry for breaking brace, hopefully we fixed it  See you again in 6 weeks (1 appointment for PRP and another regular appointment)

## 2021-06-16 NOTE — Assessment & Plan Note (Signed)
Patient continues to have the chronic pain.  Discussed with patient again that and that we do think that this is more like a seronegative rheumatoid arthritis but still does not want to change treatment options.  We have discussed that I do feel Cymbalta would be beneficial in the long run but patient still has not started this either.  At this moment we will continue with the continues continued therapy. Total time reviewing patient's chart, discussing with patient 33 minutes.

## 2021-06-16 NOTE — Assessment & Plan Note (Signed)
Patient has had injections previously.  Would like to consider the possibility of PRP and we can set up at another follow-up.  Seems to be more right greater than left at the moment.  Discussed cash payment. Patient would like to wait until after her children are in town due to her having multiple different allergies and difficulties with the procedure sometimes.  Follow-up with me again in 6 weeks

## 2021-06-22 DIAGNOSIS — H53141 Visual discomfort, right eye: Secondary | ICD-10-CM | POA: Diagnosis not present

## 2021-06-22 DIAGNOSIS — H35073 Retinal telangiectasis, bilateral: Secondary | ICD-10-CM | POA: Diagnosis not present

## 2021-06-22 DIAGNOSIS — Z961 Presence of intraocular lens: Secondary | ICD-10-CM | POA: Diagnosis not present

## 2021-06-22 DIAGNOSIS — H35373 Puckering of macula, bilateral: Secondary | ICD-10-CM | POA: Diagnosis not present

## 2021-06-22 DIAGNOSIS — H43391 Other vitreous opacities, right eye: Secondary | ICD-10-CM | POA: Diagnosis not present

## 2021-07-01 ENCOUNTER — Telehealth: Payer: Self-pay | Admitting: *Deleted

## 2021-07-01 DIAGNOSIS — N950 Postmenopausal bleeding: Secondary | ICD-10-CM

## 2021-07-01 DIAGNOSIS — Z8041 Family history of malignant neoplasm of ovary: Secondary | ICD-10-CM

## 2021-07-01 NOTE — Telephone Encounter (Signed)
Patient called with 2 things:  Told to call if she had any vaginal bleeding. Patient reports on Monday she had faint blood on her panties. She has not noticed anymore blood since then. She has history of kidney stones, she has noticed more bladder urgency in last couple of months as well. No pain with urination. Advised could schedule office to address urgency.   2. She has yearly ultrasounds due to family history of ovarian cancer.( Order placed) Patient last ultrasound was 07/2020. Per appointments not ultrasound as of now until August. Patient wanted to make sure that is okay?

## 2021-07-02 NOTE — Telephone Encounter (Signed)
Please schedule pelvic ultrasound at [facility] and then an appointment with me a few days later for a possible endometrial biopsy.   I do not recommend waiting until August for evaluation for postmenopausal bleeding.   If she is acutely bothered by urinary urgency, she could see also her PCP for a urinalysis and culture.

## 2021-07-04 NOTE — Telephone Encounter (Signed)
Left message for patient to call.

## 2021-07-05 NOTE — Telephone Encounter (Signed)
Patient scheduled on 07/07/21 for pelvic ultrasound at [facility]. She was concerned about the 32 oz of water that she must drink prior to scan. I spoke with Jenny Reichmann (scheduler) and was told that [facility] imaging scheduling protocol for pelvic ultrasound they must drink 32 oz of water. Jenny Reichmann said if patient could drink at least half of the 32 oz that would be good. I discussed this with patient and she will try this. Patient reports she has a hard time keeping her bladder full, but believes she can do half of 32 oz. She is schedule for office visit for possible endo bx on 07/21/21 ( this was next available per appointments)

## 2021-07-05 NOTE — Telephone Encounter (Signed)
Encounter reviewed and closed.  

## 2021-07-05 NOTE — Telephone Encounter (Signed)
Patient informed with below, I provided her number to call [facility] to schedule ultrasound. Once scheduled I will appointments reach out to schedule OV for possible endometrial biopsy.

## 2021-07-07 ENCOUNTER — Ambulatory Visit
Admission: RE | Admit: 2021-07-07 | Discharge: 2021-07-07 | Disposition: A | Discharge status: Home / Self Care | Payer: Medicare Other | Source: Ambulatory Visit | Attending: Obstetrics and Gynecology | Admitting: Obstetrics and Gynecology

## 2021-07-07 DIAGNOSIS — Z8041 Family history of malignant neoplasm of ovary: Secondary | ICD-10-CM

## 2021-07-07 DIAGNOSIS — N888 Other specified noninflammatory disorders of cervix uteri: Secondary | ICD-10-CM | POA: Diagnosis not present

## 2021-07-07 DIAGNOSIS — D251 Intramural leiomyoma of uterus: Secondary | ICD-10-CM | POA: Diagnosis not present

## 2021-07-14 NOTE — Progress Notes (Signed)
GYNECOLOGY  VISIT   HPI: 71 y.o.   Married  Caucasian  female   G2P2002 with Patient's last menstrual period was 08/16/1992.   here for discuss pelvic ultrasound results and possible EMB.   Patient has had postmenopausal bleeding.  She has a family history of ovarian cancer.   Narrative & Impression  CLINICAL DATA:  Family history of ovarian cancer   EXAM: TRANSABDOMINAL AND TRANSVAGINAL ULTRASOUND OF PELVIS   TECHNIQUE: Both transabdominal and transvaginal ultrasound examinations of the pelvis were performed. Transabdominal technique was performed for global imaging of the pelvis including uterus, ovaries, adnexal regions, and pelvic cul-de-sac. It was necessary to proceed with endovaginal exam following the transabdominal exam to visualize the ovaries and adnexa.   COMPARISON:  07/22/2020   FINDINGS: Uterus   Measurements: 8.9 x 3.6 x 5.8 cm = volume: 98 mL. Anteverted. Multiple nabothian cysts at cervix. Multiple uterine nodules consistent with leiomyomata. Heterogeneous exophytic mass at posterior cervix 3.0 x 2.3 x 3.6 cm, contains echogenic regions which could represent calcifications, likely a cervical leiomyoma, slightly increased from previous exam. Additional smaller leiomyomata are seen at the anterior upper uterus, 2.8 cm intramural and 1.7 cm submucosal.   Endometrium   Thickness: 5 mm. Heterogeneous echogenicity. No endometrial fluid or mass   Right ovary   Measurements: 2.1 x 0.9 x 1.6 cm = volume: 1.6 mL. Normal morphology without mass   Left ovary   Measurements: 2.6 x 1.61.9 cm = volume: 4.0 cm mL. Normal morphology without mass   Other findings   No free pelvic fluid or adnexal masses.   IMPRESSION: Unremarkable ovaries and adnexa.   Multiple uterine leiomyomata including a probable cervical leiomyoma 3.6 cm greatest diameter slightly increased from previous study.     Electronically Signed   By: Lavonia Dana M.D.   On: 07/07/2021  13:48     GYNECOLOGIC HISTORY: Patient's last menstrual period was 08/16/1992. Contraception:  PMP Menopausal hormone therapy:  none Last mammogram:   09-09-20 3D/neg/BiRads1 Last pap smear:  09-15-20 Neg:Neg HR HPV, 08-07-18 Neg, 07-28-16 Neg               OB History     Gravida  2   Para  2   Term  2   Preterm      AB      Living  2      SAB      IAB      Ectopic      Multiple      Live Births                 Patient Active Problem List   Diagnosis Date Noted   Degenerative cervical disc 04/10/2019   Carpal tunnel syndrome on left 02/27/2019   Left lumbar radiculopathy 12/28/2017   Nonallopathic lesion of thoracic region 08/02/2017   Swelling of both lower extremities 01/18/2017   Nonallopathic lesion of sacral region 01/03/2017   Allergic contact dermatitis due to metals 12/16/2016   Abnormal gait 04/10/2016   Dyslipidemia 08/12/2015   Dyspnea on exertion 08/12/2015   Palpitations 08/12/2015   Pes anserine bursitis 08/10/2015   Rupture of left posterior tibialis tendon 03/25/2015   OSA (obstructive sleep apnea) 03/05/2015   Navicular fracture of ankle 01/21/2015   Sinus infection 12/25/2014   Uric acid arthropathy 03/23/2014   High plasma oxalate 02/17/2014   Nonallopathic lesion of cervical region 12/19/2012   Fibroids 05/28/2012   Family history of ovarian cancer 05/28/2012  Strain of quadriceps muscle 04/17/2012   Nonallopathic lesion of costovertebral region 01/23/2012   Left shoulder pain 12/20/2011   Rotator cuff disorder 09/26/2011   Bilateral foot pain 09/20/2011   Arthralgia of multiple joints 07/26/2011   Arthritis of knee, degenerative 07/26/2011   Arthritis of hand, degenerative 07/26/2011   Low back pain 07/26/2011   Vitamin B 12 deficiency 03/05/2011   TMJ (temporomandibular joint syndrome) 03/05/2011   Pes planus 03/01/2011   Right knee pain 03/01/2011   Greater trochanteric bursitis of left hip 03/01/2011   Nonallopathic  lesion of lumbosacral region 01/27/2011   Obesity 12/29/2010   Fatigue 12/29/2010   Allergic rhinitis, cause unspecified    Chronic pain    GERD (gastroesophageal reflux disease)    Essential hypertension    Sleep apnea     Past Medical History:  Diagnosis Date   Abnormal uterine bleeding    Allergic rhinitis, cause unspecified    Arthritis of knee, right    Bursitis of left shoulder    Cherry hemangioma 07/28/15   vulva   Chronic pain    Dyspareunia    Elevated cholesterol    Elevated uric acid in blood 2021   Fibroid    Foot fracture, left 01/2015   and torn tendons   GERD (gastroesophageal reflux disease)    HTN (hypertension)    Kidney stones    Sleep apnea    Urinary incontinence     Past Surgical History:  Procedure Laterality Date    dilatation and curettage  2000   CARPAL TUNNEL RELEASE  1998   CARPAL TUNNEL RELEASE Left 05/21/2020   CATARACT EXTRACTION Bilateral    dental implants  2010   DENTAL SURGERY     ESOPHAGEAL DILATION  2010   HAMMER TOE SURGERY     rt   PELVIC LAPAROSCOPY  1990   ROTATOR CUFF REPAIR Right 09/2011   Dr. Tamera Punt   ROTATOR CUFF REPAIR Left 11/11/2013   Dr. Tamera Punt    SEPTOPLASTY     UMBILICAL HERNIA REPAIR  2010    Current Outpatient Medications  Medication Sig Dispense Refill   ascorbic acid (VITAMIN C) 500 MG tablet Take 250 mg by mouth daily.     Cholecalciferol (VITAMIN D3) 5000 units CAPS Take 5,000 Units by mouth. Every other day     Cyanocobalamin (VITAMIN B-12) 1000 MCG SUBL Place 1 tablet under the tongue 4 (four) times a week.     Menatetrenone (VITAMIN K2) 100 MCG TABS Take 180 mcg by mouth. Every 6th day. MK-7     Misc Natural Products (TART CHERRY ADVANCED PO) Take 800 mg by mouth daily.     MISC NATURAL PRODUCTS PO Take by mouth. Black currant seed oil     Multiple Vitamins-Minerals (SUPER MULTI-VITAMIN PO) Take 1 capsule by mouth daily. Solgar Formula VM-75     Probiotic Product (PROBIOTIC ADVANCED PO) Take by  mouth. Patient states taking 5 billion, one capsule daily. Suprema Dophilus probiotic     Sodium Chloride-Xylitol (XLEAR SINUS CARE SPRAY NA) Place into the nose. 2 sprays at bedtime     No current facility-administered medications for this visit.     ALLERGIES: Meloxicam, Molds & smuts, Cefixime, Celebrex [celecoxib], Cobalt, Gabapentin, Levofloxacin, Molybdenum, Other, Penicillins, Robaxin [methocarbamol], Tomato, Adhesive [tape], Dust mite extract, Tetracycline hcl, and Tetracyclines & related  Family History  Problem Relation Age of Onset   Rheumatologic disease Mother    Diabetes Mother    Heart attack Father  Rheumatologic disease Sister    Arthritis Sister        --Rheumatoid arthritis   Ovarian cancer Sister 38   Crohn's disease Brother    Kidney failure Brother    Ulcerative colitis Brother    Glaucoma Brother     Social History   Socioeconomic History   Marital status: Married    Spouse name: Not on file   Number of children: Not on file   Years of education: Not on file   Highest education level: Not on file  Occupational History   Occupation: Retired  Tobacco Use   Smoking status: Never   Smokeless tobacco: Never  Vaping Use   Vaping Use: Never used  Substance and Sexual Activity   Alcohol use: No    Alcohol/week: 0.0 standard drinks of alcohol   Drug use: No   Sexual activity: Not Currently    Partners: Male    Birth control/protection: Post-menopausal  Other Topics Concern   Not on file  Social History Narrative   Not on file   Social Determinants of Health   Financial Resource Strain: Not on file  Food Insecurity: Not on file  Transportation Needs: Not on file  Physical Activity: Not on file  Stress: Not on file  Social Connections: Not on file  Intimate Partner Violence: Not on file    Review of Systems  All other systems reviewed and are negative.   PHYSICAL EXAMINATION:    BP (!) 180/82   Ht '4\' 11"'$  (1.499 m)   Wt 216 lb (98 kg)    LMP 08/16/1992   BMI 43.63 kg/m     General appearance: alert, cooperative and appears stated age   Attempted EMB.         Consent done for procedure.  Sterile prep with Hibiclens.  Paracervical block with 10 cc 1% lidocaine, lot [id number], exp 01/17/23.  Attempted passing Pipelle, unsuccessful.  Os finder used and small hegar dilator as well, both not successful for dilation of the cervix.  Procedure aborted.   Chaperone was present for exam:  Estill Bamberg, CMA  ASSESSMENT  Postmenopausal bleeding.  Thickened endometrium. Fibroids.  Cervical fibroid.  Cervical stenosis.   PLAN  Discussion of postmenopausal bleeding, thickened endometrium, and cervical stenosis.  Discussion of ultrasound guided hysteroscopy with possible Myosure, dilation and curettage.  Risks, benefits, and alternatives reviewed. Risks include but are not limited to bleeding, infection, damage to surrounding organs including uterine perforation requiring hospitalization and laparoscopy, pulmonary edema, reaction to anesthesia, DVT, PE, death, need for further treatment. Surgical expectations and recovery discussed.  Patient wishes to proceed.  Will check CA125.  ACOG HO on hysteroscopy and dilation and curettage to patient.    An After Visit Summary was printed and given to the patient.  30 min  total time was spent for this patient encounter, including preparation, face-to-face counseling with the patient, coordination of care, and documentation of the encounter in addition to the attempted endometrial biopsy.

## 2021-07-21 ENCOUNTER — Encounter: Payer: Self-pay | Admitting: Obstetrics and Gynecology

## 2021-07-21 ENCOUNTER — Ambulatory Visit (INDEPENDENT_AMBULATORY_CARE_PROVIDER_SITE_OTHER): Payer: Medicare Other | Admitting: Obstetrics and Gynecology

## 2021-07-21 VITALS — BP 180/82 | Ht 59.0 in | Wt 216.0 lb

## 2021-07-21 DIAGNOSIS — N888 Other specified noninflammatory disorders of cervix uteri: Secondary | ICD-10-CM | POA: Diagnosis not present

## 2021-07-21 DIAGNOSIS — Z8041 Family history of malignant neoplasm of ovary: Secondary | ICD-10-CM | POA: Diagnosis not present

## 2021-07-21 DIAGNOSIS — D259 Leiomyoma of uterus, unspecified: Secondary | ICD-10-CM | POA: Diagnosis not present

## 2021-07-21 DIAGNOSIS — N882 Stricture and stenosis of cervix uteri: Secondary | ICD-10-CM | POA: Diagnosis not present

## 2021-07-21 DIAGNOSIS — N950 Postmenopausal bleeding: Secondary | ICD-10-CM

## 2021-07-21 DIAGNOSIS — R9389 Abnormal findings on diagnostic imaging of other specified body structures: Secondary | ICD-10-CM | POA: Diagnosis not present

## 2021-07-22 LAB — CA 125: CA 125: 4 U/mL (ref <35)

## 2021-07-24 ENCOUNTER — Telehealth: Payer: Self-pay | Admitting: Obstetrics and Gynecology

## 2021-07-24 NOTE — Telephone Encounter (Signed)
Please precert and schedule ultrasound guided hysteroscopy with possible Myosure and dilation and curettage at [facility].   Patient's diagnoses are:  postmenopausal bleeding, thickened endometrium, cervical stenosis, cervical fibroid.   Time needed:  1 hour.

## 2021-07-26 ENCOUNTER — Other Ambulatory Visit: Payer: Self-pay | Admitting: Obstetrics and Gynecology

## 2021-07-26 ENCOUNTER — Other Ambulatory Visit (HOSPITAL_COMMUNITY): Payer: Self-pay | Admitting: Obstetrics and Gynecology

## 2021-07-26 DIAGNOSIS — N950 Postmenopausal bleeding: Secondary | ICD-10-CM

## 2021-07-26 NOTE — Telephone Encounter (Signed)
Spoke with patient. Reviewed surgery dates. Patient request to proceed with surgery on 08/22/21.  Advised patient I will forward to business office for return call. I will return call once surgery date and time confirmed. Patient verbalizes understanding and is agreeable.   Surgery request sent.

## 2021-07-29 ENCOUNTER — Encounter: Payer: Self-pay | Admitting: Family Medicine

## 2021-08-01 NOTE — Progress Notes (Unsigned)
[place] Phone: [phone] Subjective:   Kerry [name], am serving as a scribe for Dr. Hulan Saas.  I'm seeing this patient by the request  of:  Street, [name], MD  CC: Right knee pain follow-up  [id number]  06/15/2021 Patient has had injections previously.  Would like to consider the possibility of PRP and we can set up at another follow-up.  Seems to be more right greater than left at the moment.  Discussed cash payment. Patient would like to wait until after her children are in town due to her having multiple different allergies and difficulties with the procedure sometimes.  Follow-up with me again in 6 weeks  Updated 08/02/2021 Kerry [name] is a 71 y.o. female coming in with complaint of knee pain. PRP today. Patient states that her knee is quite painful. Patient has done a lot of knee bending and walking.        Past Medical History:  Diagnosis Date   Abnormal uterine bleeding    Allergic rhinitis, cause unspecified    Arthritis of knee, right    Bursitis of left shoulder    Cherry hemangioma 07/28/15   vulva   Chronic pain    Dyspareunia    Elevated cholesterol    Elevated uric acid in blood 2021   Fibroid    Foot fracture, left 01/2015   and torn tendons   GERD (gastroesophageal reflux disease)    HTN (hypertension)    Kidney stones    Sleep apnea    Urinary incontinence    Past Surgical History:  Procedure Laterality Date    dilatation and curettage  2000   CARPAL TUNNEL RELEASE  1998   CARPAL TUNNEL RELEASE Left 05/21/2020   CATARACT EXTRACTION Bilateral    dental implants  2010   DENTAL SURGERY     ESOPHAGEAL DILATION  2010   HAMMER TOE SURGERY     rt   PELVIC LAPAROSCOPY  1990   ROTATOR CUFF REPAIR Right 09/2011   Dr. Luberta Robertson CUFF REPAIR Left 11/11/2013   Dr. Tamera Punt    SEPTOPLASTY     UMBILICAL HERNIA REPAIR  2010   Social History    Socioeconomic History   Marital status: Married    Spouse name: Not on file   Number of children: Not on file   Years of education: Not on file   Highest education level: Not on file  Occupational History   Occupation: Retired  Tobacco Use   Smoking status: Never   Smokeless tobacco: Never  Vaping Use   Vaping Use: Never used  Substance and Sexual Activity   Alcohol use: [name]    Alcohol/week: 0.0 standard drinks of alcohol   Drug use: [name]   Sexual activity: Not Currently    Partners: Male    Birth control/protection: Post-menopausal  Other Topics Concern   Not on file  Social History Narrative   Not on file   Social Determinants of Health   Financial Resource Strain: Not on file  Food Insecurity: Not on file  Transportation Needs: Not on file  Physical Activity: Not on file  Stress: Not on file  Social Connections: Not on file   Allergies  Allergen Reactions   Meloxicam Other (See Comments)    Made patient have stomach pain ,sore throat, joint pains, increase body temp.   Molds & Smuts Other (See Comments), Palpitations, Shortness Of Breath and Swelling  Cefixime Swelling   Celebrex [Celecoxib] Swelling   Cobalt Other (See Comments)    Per allergist   Gabapentin Other (See Comments)    Joint pain   Levofloxacin Other (See Comments)    Muscle and joint pain   Molybdenum Other (See Comments)    Per allergist   Other Other (See Comments)    TEQUIN. TEQUIN - JOINT AND MUSCLE PAIN Other Reaction: painful joints TEQUIN. TEQUIN - JOINT AND MUSCLE PAIN Tantal Metal   Penicillins Hives   Robaxin [Methocarbamol]    Tomato Hives   Adhesive [Tape] Rash    Sensitive to EKG leads; sensitive to 58M Micropore tape   Dust Mite Extract Itching, Other (See Comments) and Palpitations   Tetracycline Hcl Rash    Can take Doxy   Tetracyclines & Related Rash   Family History  Problem Relation Age of Onset   Rheumatologic disease Mother    Diabetes Mother    Heart attack  Father    Rheumatologic disease Sister    Arthritis Sister        --Rheumatoid arthritis   Ovarian cancer Sister 33   Crohn's disease Brother    Kidney failure Brother    Ulcerative colitis Brother    Glaucoma Brother       Current Outpatient Medications (Respiratory):    Sodium Chloride-Xylitol (XLEAR SINUS CARE SPRAY NA), Place into the nose. 2 sprays at bedtime   Current Outpatient Medications (Hematological):    Cyanocobalamin (VITAMIN B-12) 1000 MCG SUBL, Place 1 tablet under the tongue 4 (four) times a week.  Current Outpatient Medications (Other):    ascorbic acid (VITAMIN C) 500 MG tablet, Take 250 mg by mouth daily.   Cholecalciferol (VITAMIN D3) 5000 units CAPS, Take 5,000 Units by mouth. Every other day   Menatetrenone (VITAMIN K2) 100 MCG TABS, Take 180 mcg by mouth. Every 6th day. MK-7   Misc Natural Products (TART CHERRY ADVANCED PO), Take 800 mg by mouth daily.   MISC NATURAL PRODUCTS PO, Take by mouth. Black currant seed oil   Multiple Vitamins-Minerals (SUPER MULTI-VITAMIN PO), Take 1 capsule by mouth daily. Solgar Formula VM-75   Probiotic Product (PROBIOTIC ADVANCED PO), Take by mouth. Patient states taking 5 billion, one capsule daily. Suprema Dophilus probiotic    Objective  Height '4\' 11"'$  (1.499 m), last menstrual period 08/16/1992.   General: [name] apparent distress alert and oriented x3 mood and affect normal, dressed appropriately.  HEENT: Pupils equal, extraocular movements intact  Respiratory: Patient's speak in full sentences and does not appear short of breath  Cardiovascular: [name] lower extremity edema, non tender, [name] erythema   After informed written and verbal consent, patient was seated on exam table. Right knee was prepped with alcohol swab and utilizing anterolateral approach, patient's right knee space was injected with 1 cc of 0.5% Marcaine and then injected 5 cc of PRP. Patient tolerated the procedure well without immediate complications.    Impression and Recommendations:     The above documentation has been reviewed and is accurate and complete [name], DO

## 2021-08-01 NOTE — Progress Notes (Unsigned)
[place] Phone: [phone] Subjective:   [name], am serving as a scribe for Dr. Hulan Saas.  I'm seeing this patient by the request  of:  Street, [name], MD  CC: Back pain and polyarthralgia follow-up  [id number]  Rosabel Fianna Kerry [name] is a 71 y.o. female coming in with complaint of back and neck pain. OMT on 06/15/2021. Patient states that she is having pain throughout her body based on a change in her diet due to family being in town. L SI joint pain has shifted over the weekend to pain in the LRQ. Feels like this is muscular in nature based on a game she was playing with her granddaughter.   Medications patient has been prescribed: None  Taking:         Reviewed prior external information including notes and imaging from previsou exam, outside providers and external EMR if available.   As well as notes that were available from care everywhere and other healthcare systems.  Past medical history, social, surgical and family history all reviewed in electronic medical record.  No pertanent information unless stated regarding to the chief complaint.   Past Medical History:  Diagnosis Date   Abnormal uterine bleeding    Allergic rhinitis, cause unspecified    Arthritis of knee, right    Bursitis of left shoulder    Cherry hemangioma 07/28/15   vulva   Chronic pain    Dyspareunia    Elevated cholesterol    Elevated uric acid in blood 2021   Fibroid    Foot fracture, left 01/2015   and torn tendons   GERD (gastroesophageal reflux disease)    HTN (hypertension)    Kidney stones    Sleep apnea    Urinary incontinence     Allergies  Allergen Reactions   Meloxicam Other (See Comments)    Made patient have stomach pain ,sore throat, joint pains, increase body temp.   Molds & Smuts Other (See Comments), Palpitations, Shortness Of Breath and Swelling   Cefixime Swelling    Celebrex [Celecoxib] Swelling   Cobalt Other (See Comments)    Per allergist   Gabapentin Other (See Comments)    Joint pain   Levofloxacin Other (See Comments)    Muscle and joint pain   Molybdenum Other (See Comments)    Per allergist   Other Other (See Comments)    TEQUIN. TEQUIN - JOINT AND MUSCLE PAIN Other Reaction: painful joints TEQUIN. TEQUIN - JOINT AND MUSCLE PAIN Tantal Metal   Penicillins Hives   Robaxin [Methocarbamol]    Tomato Hives   Adhesive [Tape] Rash    Sensitive to EKG leads; sensitive to 34M Micropore tape   Dust Mite Extract Itching, Other (See Comments) and Palpitations   Tetracycline Hcl Rash    Can take Doxy   Tetracyclines & Related Rash     Review of Systems:  No headache, visual changes, nausea, vomiting, diarrhea, constipation, dizziness, abdominal pain, skin rash, fevers, chills, night sweats, weight loss, swollen lymph nodes,  chest pain, shortness of breath, mood changes. POSITIVE muscle aches, body aches, joint swelling  Objective  Blood pressure (!) 146/88, pulse 62, height '4\' 11"'$  (1.499 m), last menstrual period 08/16/1992, SpO2 97 %.   General: No apparent distress alert and oriented x3 mood and affect normal, dressed appropriately.  HEENT: Pupils equal, extraocular movements intact  Respiratory: Patient's speak in full sentences and does not appear short  of breath  Cardiovascular: No lower extremity edema, non tender, no erythema  Gait severely antalgic MSK:  Back low back exam does have loss of lordosis.  Significant tightness noted in the thoracolumbar junction more than patient's usual.  Patient does have tenderness to palpation  Osteopathic findings  C2 flexed rotated and side bent right C5 flexed rotated and side bent right  T3 extended rotated and side bent right inhaled rib T6 extended rotated and side bent left L2 flexed rotated and side bent right Sacrum right on right    Assessment and Plan:  Degenerative cervical  disc Chronic problem with exacerbation.  Patient still has likely has seronegative rheumatoid arthritis.  We will continue to monitor.  Can consider the possibility of Cymbalta which patient has not wanted to do and has not wanted to make any other significant changes.  He is responding somewhat to intermittent manipulation.  Follow-up again in 6 to 8 weeks otherwise.    Nonallopathic problems  Decision today to treat with OMT was based on Physical Exam  After verbal consent patient was treated with , ME, FPR techniques in cervical, rib, thoracic, lumbar, and sacral  areas  Patient tolerated the procedure well with improvement in symptoms  Patient given exercises, stretches and lifestyle modifications  See medications in patient instructions if given  Patient will follow up in 4-8 weeks     The above documentation has been reviewed and is accurate and complete [name], DO         Note: This dictation was prepared with Dragon dictation along with smaller phrase technology. Any transcriptional errors that result from this process are unintentional.

## 2021-08-02 ENCOUNTER — Ambulatory Visit (INDEPENDENT_AMBULATORY_CARE_PROVIDER_SITE_OTHER): Payer: Self-pay | Admitting: Family Medicine

## 2021-08-02 ENCOUNTER — Encounter: Payer: Self-pay | Admitting: Family Medicine

## 2021-08-02 ENCOUNTER — Ambulatory Visit (INDEPENDENT_AMBULATORY_CARE_PROVIDER_SITE_OTHER): Payer: Medicare Other | Admitting: Family Medicine

## 2021-08-02 VITALS — BP 146/88 | HR 62 | Ht 59.0 in

## 2021-08-02 DIAGNOSIS — M170 Bilateral primary osteoarthritis of knee: Secondary | ICD-10-CM

## 2021-08-02 DIAGNOSIS — M9902 Segmental and somatic dysfunction of thoracic region: Secondary | ICD-10-CM

## 2021-08-02 DIAGNOSIS — M9901 Segmental and somatic dysfunction of cervical region: Secondary | ICD-10-CM | POA: Diagnosis not present

## 2021-08-02 DIAGNOSIS — M9908 Segmental and somatic dysfunction of rib cage: Secondary | ICD-10-CM | POA: Diagnosis not present

## 2021-08-02 DIAGNOSIS — M5030 Other cervical disc degeneration, unspecified cervical region: Secondary | ICD-10-CM | POA: Diagnosis not present

## 2021-08-02 DIAGNOSIS — M9903 Segmental and somatic dysfunction of lumbar region: Secondary | ICD-10-CM | POA: Diagnosis not present

## 2021-08-02 DIAGNOSIS — M9904 Segmental and somatic dysfunction of sacral region: Secondary | ICD-10-CM

## 2021-08-02 NOTE — Assessment & Plan Note (Signed)
Chronic problem with exacerbation.  Patient still has likely has seronegative rheumatoid arthritis.  We will continue to monitor.  Can consider the possibility of Cymbalta which patient has not wanted to do and has not wanted to make any other significant changes.  He is responding somewhat to intermittent manipulation.  Follow-up again in 6 to 8 weeks otherwise.

## 2021-08-02 NOTE — Patient Instructions (Addendum)
See me in 5-8 weeks No ice or IBU for 3 days Heat or Tylenol for pain

## 2021-08-02 NOTE — Assessment & Plan Note (Signed)
Significant arthritic changes of the knees bilaterally.  End-stage.  Patient still wants to avoid any surgical intervention especially secondary to the possible underlying autoimmune that we still are having difficulty with prescription as well as the allergies to different metal allergies.  Patient given PRP injections and told post PRP care.  Follow-up again in 6 to 8 weeks

## 2021-08-03 NOTE — Telephone Encounter (Signed)
Spoke with patient. Surgery date request confirmed.  Advised surgery is scheduled for 08/22/21, [facility] at 0730.  Surgery instruction sheet and hospital brochure reviewed, printed copy will be mailed.  Patient verbalizes understanding and is agreeable.   Routing to [employer] for benefits.  Encounter may be closed once reviewed.

## 2021-08-08 NOTE — Telephone Encounter (Signed)
Spoke with patient regarding surgery benefits. Patient acknowledges understanding of information presented. Patient is aware that benefits presented are professional benefits only. Patient is aware the hospital will call with facility benefits. See account note.    

## 2021-08-10 NOTE — Progress Notes (Signed)
GYNECOLOGY  VISIT   HPI: 71 y.o.   Married  Caucasian  female   G2P2002 with Patient's last menstrual period was 08/16/1992.   here for pre op exam.  Patient has had postmenopausal bleeding.  She has a family history of ovarian cancer.  Pelvic US showed EMS 5 mm and heterogeneous echogenicity. She has known fibroids including a 3.0 x 2.3 x 3.6 cm posterior cervical fibroid.  She also has a 2.8 cm intramural and a 1.7 cm submucosal fibroid.  Her ovaries are normal.  No free fluid was noted.   Patient states day after attempted EMB, she had a "sore area" on left labia minora. EMB was not possible due to cervical stenosis.   CA125 4 on 07/21/21.   Long hx of right sided abdominal pain.   States her blood pressure has been elevated lately.   Asking about chiropractor care to treat urinary incontinence.   GYNECOLOGIC HISTORY: Patient's last menstrual period was 08/16/1992. Contraception:  PMP Menopausal hormone therapy:  none Last mammogram:   09-09-20 3D/neg/BiRads1 Last pap smear: 09-15-20 Neg:Neg HR HPV, 08-07-18 Neg, 07-28-16 Neg        OB History     Gravida  2   Para  2   Term  2   Preterm      AB      Living  2      SAB      IAB      Ectopic      Multiple      Live Births                 Patient Active Problem List   Diagnosis Date Noted   Degenerative cervical disc 04/10/2019   Carpal tunnel syndrome on left 02/27/2019   Left lumbar radiculopathy 12/28/2017   Nonallopathic lesion of thoracic region 08/02/2017   Swelling of both lower extremities 01/18/2017   Nonallopathic lesion of sacral region 01/03/2017   Allergic contact dermatitis due to metals 12/16/2016   Abnormal gait 04/10/2016   Dyslipidemia 08/12/2015   Dyspnea on exertion 08/12/2015   Palpitations 08/12/2015   Pes anserine bursitis 08/10/2015   Rupture of left posterior tibialis tendon 03/25/2015   OSA (obstructive sleep apnea) 03/05/2015   Navicular fracture of ankle 01/21/2015    Sinus infection 12/25/2014   Uric acid arthropathy 03/23/2014   High plasma oxalate 02/17/2014   Nonallopathic lesion of cervical region 12/19/2012   Fibroids 05/28/2012   Family history of ovarian cancer 05/28/2012   Strain of quadriceps muscle 04/17/2012   Nonallopathic lesion of costovertebral region 01/23/2012   Left shoulder pain 12/20/2011   Rotator cuff disorder 09/26/2011   Bilateral foot pain 09/20/2011   Arthralgia of multiple joints 07/26/2011   Arthritis of knee, degenerative 07/26/2011   Arthritis of hand, degenerative 07/26/2011   Low back pain 07/26/2011   Vitamin B 12 deficiency 03/05/2011   TMJ (temporomandibular joint syndrome) 03/05/2011   Pes planus 03/01/2011   Right knee pain 03/01/2011   Greater trochanteric bursitis of left hip 03/01/2011   Nonallopathic lesion of lumbosacral region 01/27/2011   Obesity 12/29/2010   Fatigue 12/29/2010   Allergic rhinitis, cause unspecified    Chronic pain    GERD (gastroesophageal reflux disease)    Essential hypertension    Sleep apnea     Past Medical History:  Diagnosis Date   Abnormal uterine bleeding    Allergic rhinitis, cause unspecified    Arthritis of knee, right  Bursitis of left shoulder    Cherry hemangioma 07/28/15   vulva   Chronic pain    Dyspareunia    Elevated cholesterol    Elevated uric acid in blood 2021   Fibroid    Foot fracture, left 01/2015   and torn tendons   GERD (gastroesophageal reflux disease)    HTN (hypertension)    Kidney stones    Sleep apnea    Urinary incontinence     Past Surgical History:  Procedure Laterality Date    dilatation and curettage  2000   CARPAL TUNNEL RELEASE  1998   CARPAL TUNNEL RELEASE Left 05/21/2020   CATARACT EXTRACTION Bilateral    dental implants  2010   DENTAL SURGERY     ESOPHAGEAL DILATION  2010   HAMMER TOE SURGERY     rt   PELVIC LAPAROSCOPY  1990   ROTATOR CUFF REPAIR Right 09/2011   Dr. Tamera Punt   ROTATOR CUFF REPAIR Left  11/11/2013   Dr. Tamera Punt    SEPTOPLASTY     UMBILICAL HERNIA REPAIR  2010   Patient reports mesh was used for the repair.    Current Outpatient Medications  Medication Sig Dispense Refill   ascorbic acid (VITAMIN C) 500 MG tablet Take 250 mg by mouth daily.     Cholecalciferol (VITAMIN D3) 5000 units CAPS Take 5,000 Units by mouth. Every other day     Cyanocobalamin (VITAMIN B-12) 1000 MCG SUBL Place 1 tablet under the tongue 4 (four) times a week.     Menatetrenone (VITAMIN K2) 100 MCG TABS Take 180 mcg by mouth. Every 6th day. MK-7     Misc Natural Products (TART CHERRY ADVANCED PO) Take 800 mg by mouth daily.     MISC NATURAL PRODUCTS PO Take by mouth. Black currant seed oil     Multiple Vitamins-Minerals (SUPER MULTI-VITAMIN PO) Take 1 capsule by mouth daily. Solgar Formula VM-75     Omega-3 Fatty Acids (FISH OIL) 1000 MG CAPS Take by mouth.     Probiotic Product (PROBIOTIC ADVANCED PO) Take by mouth. Patient states taking 5 billion, one capsule daily. Suprema Dophilus probiotic     Sodium Chloride-Xylitol (XLEAR SINUS CARE SPRAY NA) Place into the nose. 2 sprays at bedtime     No current facility-administered medications for this visit.     ALLERGIES: Meloxicam, Molds & smuts, Cefixime, Celebrex [celecoxib], Cobalt, Gabapentin, Levofloxacin, Molybdenum, Other, Penicillins, Robaxin [methocarbamol], Tomato, Adhesive [tape], Dust mite extract, Tetracycline hcl, and Tetracyclines & related  Family History  Problem Relation Age of Onset   Rheumatologic disease Mother    Diabetes Mother    Heart attack Father    Rheumatologic disease Sister    Arthritis Sister        --Rheumatoid arthritis   Ovarian cancer Sister 51   Crohn's disease Brother    Kidney failure Brother    Ulcerative colitis Brother    Glaucoma Brother     Social History   Socioeconomic History   Marital status: Married    Spouse name: Not on file   Number of children: Not on file   Years of education: Not  on file   Highest education level: Not on file  Occupational History   Occupation: Retired  Tobacco Use   Smoking status: Never   Smokeless tobacco: Never  Vaping Use   Vaping Use: Never used  Substance and Sexual Activity   Alcohol use: No    Alcohol/week: 0.0 standard drinks of alcohol  Drug use: No   Sexual activity: Not Currently    Partners: Male    Birth control/protection: Post-menopausal  Other Topics Concern   Not on file  Social History Narrative   Not on file   Social Determinants of Health   Financial Resource Strain: Not on file  Food Insecurity: Not on file  Transportation Needs: Not on file  Physical Activity: Not on file  Stress: Not on file  Social Connections: Not on file  Intimate Partner Violence: Not on file    Review of Systems  All other systems reviewed and are negative.   PHYSICAL EXAMINATION:    BP (!) 164/82   Ht '4\' 11"'$  (1.499 m)   Wt 216 lb (98 kg)   LMP 08/16/1992   BMI 43.63 kg/m     General appearance: alert, cooperative and appears stated age Head: Normocephalic, without obvious abnormality, atraumatic Neck: no adenopathy, supple, symmetrical, trachea midline. Lungs: clear to auscultation bilaterally Heart: regular rate and rhythm Abdomen: obese, soft, non-tender, no masses,  no organomegaly Skin: Skin color, texture, turgor normal. No rashes or lesions No abnormal inguinal nodes palpated   Pelvic: External genitalia:  no lesions              Urethra:  normal appearing urethra with no masses, tenderness or lesions              Bartholins and Skenes: normal                 Vagina: normal appearing vagina with normal color and discharge, no lesions              Cervix: no lesions                Bimanual Exam:  Uterus:  normal size, contour, position, consistency, mobility, non-tender              Adnexa: no mass, fullness, tenderness    Chaperone was present for exam:  Estill Bamberg, CMA  ASSESSMENT  Postmenopausal bleeding.   Thickened endometrium. Fibroids.  Cervical fibroid.  Cervical stenosis.  Umbilical hernia repair.  Has mesh present.  Elevated blood pressure readings.  PLAN  We reviewed her surgical procedure:  US guided hysteroscopy, possible Myosure resection of endometrium, dilation and curettage.   Risks, benefits, and alternatives have been reviewed with the patient who wishes to proceed.  Surgical expectations and recovery discussed.  We discussed risks and benefits of use of Cytotec.  Cytotec will not be used preop.  We reviewed her current medications.  She will see her PCP regarding her blood pressure prior to surgery.  I did tell her that anesthesia team could cancel surgery if her blood pressure is significantly elevated the day of her procedure.    An After Visit Summary was printed and given to the patient.

## 2021-08-11 ENCOUNTER — Ambulatory Visit (INDEPENDENT_AMBULATORY_CARE_PROVIDER_SITE_OTHER): Payer: Medicare Other | Admitting: Obstetrics and Gynecology

## 2021-08-11 ENCOUNTER — Encounter: Payer: Self-pay | Admitting: Obstetrics and Gynecology

## 2021-08-11 ENCOUNTER — Telehealth: Payer: Self-pay | Admitting: Obstetrics and Gynecology

## 2021-08-11 VITALS — BP 164/82 | Ht 59.0 in | Wt 216.0 lb

## 2021-08-11 DIAGNOSIS — N950 Postmenopausal bleeding: Secondary | ICD-10-CM

## 2021-08-11 DIAGNOSIS — R030 Elevated blood-pressure reading, without diagnosis of hypertension: Secondary | ICD-10-CM | POA: Diagnosis not present

## 2021-08-11 DIAGNOSIS — D259 Leiomyoma of uterus, unspecified: Secondary | ICD-10-CM

## 2021-08-11 DIAGNOSIS — N882 Stricture and stenosis of cervix uteri: Secondary | ICD-10-CM | POA: Diagnosis not present

## 2021-08-11 NOTE — Telephone Encounter (Signed)
Please check on patient's appointment with her PCP regarding her elevated blood pressure.   She was seen in the office today and has usually has elevated readings at provider visits.  Patient is scheduled for an ultrasound guided hysteroscopy for 08/22/21.   Patient will contact her PCP tomorrow to schedule an appointment with him prior to her surgery.

## 2021-08-12 ENCOUNTER — Encounter: Payer: Self-pay | Admitting: Obstetrics and Gynecology

## 2021-08-12 ENCOUNTER — Encounter (HOSPITAL_BASED_OUTPATIENT_CLINIC_OR_DEPARTMENT_OTHER): Payer: Self-pay | Admitting: Obstetrics and Gynecology

## 2021-08-12 NOTE — Progress Notes (Signed)
Spoke w/ via phone for pre-op interview--- [place]---- Surgeon orders pending.               Lab results------ COVID test -----patient states asymptomatic no test needed Arrive at ------- 0530 NPO after MN NO Solid Food.   Med rec completed Medications to take morning of surgery ----- NONE Diabetic medication ----- Patient instructed no nail polish to be worn day of surgery Patient instructed to bring photo id and insurance card day of surgery Patient aware to have Driver (ride ) / caregiver  Husband Lawrencia Mauney  for 24 hours after surgery  Patient Special Instructions ----- Bring CPAP mask and tubing day of surgery. Pre-Op special Istructions ----- Patient verbalized understanding of instructions that were given at this phone interview. Patient denies shortness of breath, chest pain, fever, cough at this phone interview.

## 2021-08-12 NOTE — Telephone Encounter (Signed)
Spoke with patient.  Patient states she called PCP and is awaiting return call. I advised I will f/u with patient on Monday. Patient verbalizes understanding.   [facility] PAT notified.

## 2021-08-14 ENCOUNTER — Encounter: Payer: Self-pay | Admitting: Obstetrics and Gynecology

## 2021-08-15 NOTE — Telephone Encounter (Signed)
See open telephone encounter dated 08/11/21.   Encounter closed.

## 2021-08-15 NOTE — Telephone Encounter (Signed)
[place] (supporting Nunzio Cobbs, MD) 9 hours ago (11:49 PM)    1.  Jahid Weida/Dr. Quincy Simmonds. Dr. Venetia Maxon (family physician) did not have any appt cancellations on Friday.  Will keep checking this week prior to procedure on Aug 7.  When I called on Friday, the first available appt was Aug 10.   2.  Dr. Quincy Simmonds, when you return. The equipment used for urinary incontinence that I asked about during my appt last week is called an ExMI Pelvic Therapy Chair. The practice is located in [place], [place] (near [place]) and says this is the only location in [place] with this piece of equipment.  Have you heard of it/have an opinion?   Call placed to Dr. Venetia Maxon at [phone]. OV scheduled for 08/17/21 at 0950.   Call placed to patient to provide update on appt. Left detailed message with appt details. Requested return call or MyChart message acknowledging message received.   Routing MyChart message to Dr. Quincy Simmonds to advice on second portion of MyChart message.

## 2021-08-15 NOTE — Telephone Encounter (Signed)
Patient returned call and confirmed message received regarding PCP appt on 08/17/21.

## 2021-08-17 ENCOUNTER — Encounter: Payer: Self-pay | Admitting: Family Medicine

## 2021-08-17 DIAGNOSIS — D1721 Benign lipomatous neoplasm of skin and subcutaneous tissue of right arm: Secondary | ICD-10-CM | POA: Diagnosis not present

## 2021-08-17 DIAGNOSIS — I110 Hypertensive heart disease with heart failure: Secondary | ICD-10-CM | POA: Diagnosis not present

## 2021-08-17 DIAGNOSIS — N938 Other specified abnormal uterine and vaginal bleeding: Secondary | ICD-10-CM | POA: Diagnosis not present

## 2021-08-17 DIAGNOSIS — E538 Deficiency of other specified B group vitamins: Secondary | ICD-10-CM | POA: Diagnosis not present

## 2021-08-17 NOTE — Telephone Encounter (Signed)
Please obtain a copy of visit with PCP from 08/17/21 to see recommendations regarding elevated blood pressure in light of upcoming surgery for postmenopausal bleeding.

## 2021-08-18 NOTE — Telephone Encounter (Signed)
Call placed to Dr. Venetia Maxon, left message on nurse/triage line requesting OV notes/recommendations regarding elevated B/P in preparation for upcoming surgery 08/22/21. Fax to [phone], [place] or return call to [phone].

## 2021-08-18 NOTE — Telephone Encounter (Signed)
Spoke with patient.  Seen by PCP on 08/17/21.  States B/P in office 144/86 and 138/84. States she was advised by Dr. Venetia Maxon this is fairly consistent with previous visits, elevated, but no need for medication at this time. States she was advised ok to proceed with surgery as scheduled. Also states she received B12 injection at that visit. Advised patient I will provide update to Dr. Quincy Simmonds and return call if any additional recommendations.   Routing to Dr. Quincy Simmonds

## 2021-08-19 NOTE — Telephone Encounter (Signed)
Thank you for the update.  I have no further recommendations at this time.   You may close this encounter.

## 2021-08-21 NOTE — H&P (Signed)
Office Visit  08/11/2021 [facility], Everardo All, MD Obstetrics and Gynecology PMB (postmenopausal bleeding) +3 more Dx Pre-op Exam ; Referred by Street, [name], MD Reason for Visit   Additional Documentation  Vitals:  BP 164/82 Important   Ht '4\' 11"'$  (1.499 m) Wt 98 kg LMP 08/16/1992 BMI 43.63 kg/m BSA 2.02 m  More Vitals  Flowsheets:  Anthropometrics, MEWS Score, NEWS   Encounter Info:  Billing Info, History, Allergies, Detailed Report    All Notes    Progress Notes by Nunzio Cobbs, MD at 08/11/2021 4:30 PM  Author: Nunzio Cobbs, MD Author Type: Physician Filed: 08/12/2021  8:35 AM  Note Status: Signed Cosign: Cosign Not Required Encounter Date: 08/11/2021  Editor: Nunzio Cobbs, MD (Physician)      Prior Versions: 1. Lowella Fairy, CMA (Certified Psychologist, sport and exercise) at 08/11/2021  4:33 PM - Sign when Signing Visit    GYNECOLOGY  VISIT   HPI: 71 y.o.   Married  Caucasian  female   G2P2002 with Patient's last menstrual period was 08/16/1992.   here for pre op exam.  Patient has had postmenopausal bleeding.  She has a family history of ovarian cancer.   Pelvic US showed EMS 5 mm and heterogeneous echogenicity. She has known fibroids including a 3.0 x 2.3 x 3.6 cm posterior cervical fibroid.  She also has a 2.8 cm intramural and a 1.7 cm submucosal fibroid.  Her ovaries are normal.  No free fluid was noted.    Patient states day after attempted EMB, she had a "sore area" on left labia minora. EMB was not possible due to cervical stenosis.   CA125 4 on 07/21/21.    Long hx of right sided abdominal pain.    States her blood pressure has been elevated lately.   Asking about chiropractor care to treat urinary incontinence.    GYNECOLOGIC HISTORY: Patient's last menstrual period was 08/16/1992. Contraception:  PMP Menopausal hormone therapy:  none Last mammogram:   09-09-20  3D/neg/BiRads1 Last pap smear: 09-15-20 Neg:Neg HR HPV, 08-07-18 Neg, 07-28-16 Neg        OB History       Gravida  2   Para  2   Term  2   Preterm      AB      Living  2        SAB      IAB      Ectopic      Multiple      Live Births                        Patient Active Problem List    Diagnosis Date Noted   Degenerative cervical disc 04/10/2019   Carpal tunnel syndrome on left 02/27/2019   Left lumbar radiculopathy 12/28/2017   Nonallopathic lesion of thoracic region 08/02/2017   Swelling of both lower extremities 01/18/2017   Nonallopathic lesion of sacral region 01/03/2017   Allergic contact dermatitis due to metals 12/16/2016   Abnormal gait 04/10/2016   Dyslipidemia 08/12/2015   Dyspnea on exertion 08/12/2015   Palpitations 08/12/2015   Pes anserine bursitis 08/10/2015   Rupture of left posterior tibialis tendon 03/25/2015   OSA (obstructive sleep apnea) 03/05/2015   Navicular fracture of ankle 01/21/2015   Sinus infection 12/25/2014   Uric acid arthropathy 03/23/2014   High plasma oxalate 02/17/2014   Nonallopathic  lesion of cervical region 12/19/2012   Fibroids 05/28/2012   Family history of ovarian cancer 05/28/2012   Strain of quadriceps muscle 04/17/2012   Nonallopathic lesion of costovertebral region 01/23/2012   Left shoulder pain 12/20/2011   Rotator cuff disorder 09/26/2011   Bilateral foot pain 09/20/2011   Arthralgia of multiple joints 07/26/2011   Arthritis of knee, degenerative 07/26/2011   Arthritis of hand, degenerative 07/26/2011   Low back pain 07/26/2011   Vitamin B 12 deficiency 03/05/2011   TMJ (temporomandibular joint syndrome) 03/05/2011   Pes planus 03/01/2011   Right knee pain 03/01/2011   Greater trochanteric bursitis of left hip 03/01/2011   Nonallopathic lesion of lumbosacral region 01/27/2011   Obesity 12/29/2010   Fatigue 12/29/2010   Allergic rhinitis, cause unspecified     Chronic pain     GERD  (gastroesophageal reflux disease)     Essential hypertension     Sleep apnea            Past Medical History:  Diagnosis Date   Abnormal uterine bleeding     Allergic rhinitis, cause unspecified     Arthritis of knee, right     Bursitis of left shoulder     Cherry hemangioma 07/28/15    vulva   Chronic pain     Dyspareunia     Elevated cholesterol     Elevated uric acid in blood 2021   Fibroid     Foot fracture, left 01/2015    and torn tendons   GERD (gastroesophageal reflux disease)     HTN (hypertension)     Kidney stones     Sleep apnea     Urinary incontinence             Past Surgical History:  Procedure Laterality Date    dilatation and curettage   2000   CARPAL TUNNEL RELEASE   1998   CARPAL TUNNEL RELEASE Left 05/21/2020   CATARACT EXTRACTION Bilateral     dental implants   2010   DENTAL SURGERY       ESOPHAGEAL DILATION   2010   HAMMER TOE SURGERY        rt   PELVIC LAPAROSCOPY   1990   ROTATOR CUFF REPAIR Right 09/2011    Dr. Tamera Punt   ROTATOR CUFF REPAIR Left 11/11/2013    Dr. Tamera Punt    SEPTOPLASTY       UMBILICAL HERNIA REPAIR   2010    Patient reports mesh was used for the repair.            Current Outpatient Medications  Medication Sig Dispense Refill   ascorbic acid (VITAMIN C) 500 MG tablet Take 250 mg by mouth daily.       Cholecalciferol (VITAMIN D3) 5000 units CAPS Take 5,000 Units by mouth. Every other day       Cyanocobalamin (VITAMIN B-12) 1000 MCG SUBL Place 1 tablet under the tongue 4 (four) times a week.       Menatetrenone (VITAMIN K2) 100 MCG TABS Take 180 mcg by mouth. Every 6th day. MK-7       Misc Natural Products (TART CHERRY ADVANCED PO) Take 800 mg by mouth daily.       MISC NATURAL PRODUCTS PO Take by mouth. Black currant seed oil       Multiple Vitamins-Minerals (SUPER MULTI-VITAMIN PO) Take 1 capsule by mouth daily. Solgar Formula VM-75       Omega-3 Fatty Acids (FISH OIL) 1000 MG CAPS  Take by mouth.       Probiotic  Product (PROBIOTIC ADVANCED PO) Take by mouth. Patient states taking 5 billion, one capsule daily. Suprema Dophilus probiotic       Sodium Chloride-Xylitol (XLEAR SINUS CARE SPRAY NA) Place into the nose. 2 sprays at bedtime        No current facility-administered medications for this visit.      ALLERGIES: Meloxicam, Molds & smuts, Cefixime, Celebrex [celecoxib], Cobalt, Gabapentin, Levofloxacin, Molybdenum, Other, Penicillins, Robaxin [methocarbamol], Tomato, Adhesive [tape], Dust mite extract, Tetracycline hcl, and Tetracyclines & related        Family History  Problem Relation Age of Onset   Rheumatologic disease Mother     Diabetes Mother     Heart attack Father     Rheumatologic disease Sister     Arthritis Sister          --Rheumatoid arthritis   Ovarian cancer Sister 21   Crohn's disease Brother     Kidney failure Brother     Ulcerative colitis Brother     Glaucoma Brother        Social History         Socioeconomic History   Marital status: Married      Spouse name: Not on file   Number of children: Not on file   Years of education: Not on file   Highest education level: Not on file  Occupational History   Occupation: Retired  Tobacco Use   Smoking status: Never   Smokeless tobacco: Never  Vaping Use   Vaping Use: Never used  Substance and Sexual Activity   Alcohol use: No      Alcohol/week: 0.0 standard drinks of alcohol   Drug use: No   Sexual activity: Not Currently      Partners: Male      Birth control/protection: Post-menopausal  Other Topics Concern   Not on file  Social History Narrative   Not on file    Social Determinants of Health    Financial Resource Strain: Not on file  Food Insecurity: Not on file  Transportation Needs: Not on file  Physical Activity: Not on file  Stress: Not on file  Social Connections: Not on file  Intimate Partner Violence: Not on file      Review of Systems  All other systems reviewed and are negative.      PHYSICAL EXAMINATION:     BP (!) 164/82   Ht '4\' 11"'$  (1.499 m)   Wt 216 lb (98 kg)   LMP 08/16/1992   BMI 43.63 kg/m     General appearance: alert, cooperative and appears stated age Head: Normocephalic, without obvious abnormality, atraumatic Neck: no adenopathy, supple, symmetrical, trachea midline. Lungs: clear to auscultation bilaterally Heart: regular rate and rhythm Abdomen: obese, soft, non-tender, no masses,  no organomegaly Skin: Skin color, texture, turgor normal. No rashes or lesions No abnormal inguinal nodes palpated    Pelvic: External genitalia:  no lesions              Urethra:  normal appearing urethra with no masses, tenderness or lesions              Bartholins and Skenes: normal                 Vagina: normal appearing vagina with normal color and discharge, no lesions              Cervix: no lesions  Bimanual Exam:  Uterus:  normal size, contour, position, consistency, mobility, non-tender              Adnexa: no mass, fullness, tenderness     Chaperone was present for exam:  Estill Bamberg, CMA   ASSESSMENT   Postmenopausal bleeding.  Thickened endometrium. Fibroids.  Cervical fibroid.  Cervical stenosis.  Umbilical hernia repair.  Has mesh present.  Elevated blood pressure readings.   PLAN   We reviewed her surgical procedure:  US guided hysteroscopy, possible Myosure resection of endometrium, dilation and curettage.   Risks, benefits, and alternatives have been reviewed with the patient who wishes to proceed.  Surgical expectations and recovery discussed.  We discussed risks and benefits of use of Cytotec.  Cytotec will not be used preop.  We reviewed her current medications.  She will see her PCP regarding her blood pressure prior to surgery.  I did tell her that anesthesia team could cancel surgery if her blood pressure is significantly elevated the day of her procedure.    An After Visit Summary was printed and given to the patient.

## 2021-08-21 NOTE — Anesthesia Preprocedure Evaluation (Addendum)
Anesthesia Evaluation  Patient identified by MRN, date of birth, ID band Patient awake    Reviewed: Allergy & Precautions, NPO status , Patient's Chart, lab work & pertinent test results  Airway Mallampati: II  TM Distance: >3 FB Neck ROM: Full    Dental  (+) Dental Advisory Given, Edentulous Upper, Edentulous Lower   Pulmonary sleep apnea ,    Pulmonary exam normal breath sounds clear to auscultation       Cardiovascular hypertension,  Rhythm:Regular Rate:Normal + Systolic murmurs Echo 06/2033 1. Left ventricular ejection fraction, by estimation, is 60 to 65%. The left ventricle has normal function. The left ventricle has no regional wall motion abnormalities. There is moderate left ventricular hypertrophy. Left ventricular diastolic parameters are indeterminate.  2. Right ventricular systolic function is normal. The right ventricular size is normal. Tricuspid regurgitation signal is inadequate for assessing PA pressure.  3. Left atrial size was mildly dilated.  4. No evidence of mitral valve regurgitation. Moderate mitral annular calcification.  5. Aortic valve regurgitation is not visualized. Aortic valve  sclerosis/calcification is present, without any evidence of aortic stenosis.  6. The inferior vena cava is normal in size with greater than 50% respiratory variability, suggesting right atrial pressure of 3 mmHg.    Neuro/Psych  Neuromuscular disease    GI/Hepatic Neg liver ROS, GERD  ,  Endo/Other  Morbid obesity  Renal/GU Renal disease     Musculoskeletal  (+) Arthritis ,   Abdominal (+) + obese,   Peds  Hematology negative hematology ROS (+)   Anesthesia Other Findings   Reproductive/Obstetrics negative OB ROS                           Anesthesia Physical Anesthesia Plan  ASA: 3  Anesthesia Plan: General   Post-op Pain Management: Tylenol PO (pre-op)* and Toradol IV  (intra-op)*   Induction: Intravenous  PONV Risk Score and Plan: 4 or greater and Ondansetron, Dexamethasone, Midazolam and Treatment may vary due to age or medical condition  Airway Management Planned: LMA  Additional Equipment: None  Intra-op Plan:   Post-operative Plan: Extubation in OR  Informed Consent: I have reviewed the patients History and Physical, chart, labs and discussed the procedure including the risks, benefits and alternatives for the proposed anesthesia with the patient or authorized representative who has indicated his/her understanding and acceptance.     Dental advisory given  Plan Discussed with: CRNA  Anesthesia Plan Comments: (Pt with elevated BP in clinic visit with Dr. Quincy Simmonds. She referred her to PCP for eval. BP reportedly 130s/80s with PCP. Here 200s/90s. I personally checked her BP and 190/92. I discussed she most likely needs to be on medication. I discussed the risk of stroke and MI at length, but that, given she appears to have normal BP at baseline, the risk is probably low. Discussed keeping SBP ~170s with CRNA.)      Anesthesia Quick Evaluation

## 2021-08-22 ENCOUNTER — Ambulatory Visit (HOSPITAL_BASED_OUTPATIENT_CLINIC_OR_DEPARTMENT_OTHER)
Admission: RE | Admit: 2021-08-22 | Discharge: 2021-08-22 | Disposition: A | Discharge status: Home / Self Care | Payer: Medicare Other | Attending: Obstetrics and Gynecology | Admitting: Obstetrics and Gynecology

## 2021-08-22 ENCOUNTER — Other Ambulatory Visit: Payer: Self-pay

## 2021-08-22 ENCOUNTER — Encounter (HOSPITAL_BASED_OUTPATIENT_CLINIC_OR_DEPARTMENT_OTHER): Payer: Self-pay | Admitting: Obstetrics and Gynecology

## 2021-08-22 ENCOUNTER — Encounter (HOSPITAL_BASED_OUTPATIENT_CLINIC_OR_DEPARTMENT_OTHER)
Admission: RE | Disposition: A | Discharge status: Home / Self Care | Payer: Self-pay | Source: Home / Self Care | Attending: Obstetrics and Gynecology

## 2021-08-22 ENCOUNTER — Ambulatory Visit (HOSPITAL_BASED_OUTPATIENT_CLINIC_OR_DEPARTMENT_OTHER): Payer: Medicare Other | Admitting: Anesthesiology

## 2021-08-22 ENCOUNTER — Ambulatory Visit (HOSPITAL_COMMUNITY)
Admission: RE | Admit: 2021-08-22 | Discharge: 2021-08-22 | Disposition: A | Discharge status: Home / Self Care | Payer: Medicare Other | Source: Ambulatory Visit | Attending: Obstetrics and Gynecology | Admitting: Obstetrics and Gynecology

## 2021-08-22 DIAGNOSIS — M1990 Unspecified osteoarthritis, unspecified site: Secondary | ICD-10-CM | POA: Diagnosis not present

## 2021-08-22 DIAGNOSIS — I10 Essential (primary) hypertension: Secondary | ICD-10-CM | POA: Diagnosis not present

## 2021-08-22 DIAGNOSIS — K219 Gastro-esophageal reflux disease without esophagitis: Secondary | ICD-10-CM | POA: Diagnosis not present

## 2021-08-22 DIAGNOSIS — N950 Postmenopausal bleeding: Secondary | ICD-10-CM

## 2021-08-22 DIAGNOSIS — N840 Polyp of corpus uteri: Secondary | ICD-10-CM | POA: Insufficient documentation

## 2021-08-22 DIAGNOSIS — Z6841 Body Mass Index (BMI) 40.0 and over, adult: Secondary | ICD-10-CM | POA: Diagnosis not present

## 2021-08-22 DIAGNOSIS — G4730 Sleep apnea, unspecified: Secondary | ICD-10-CM

## 2021-08-22 DIAGNOSIS — R9389 Abnormal findings on diagnostic imaging of other specified body structures: Secondary | ICD-10-CM

## 2021-08-22 DIAGNOSIS — N882 Stricture and stenosis of cervix uteri: Secondary | ICD-10-CM | POA: Diagnosis not present

## 2021-08-22 DIAGNOSIS — I3481 Nonrheumatic mitral (valve) annulus calcification: Secondary | ICD-10-CM | POA: Diagnosis not present

## 2021-08-22 DIAGNOSIS — G709 Myoneural disorder, unspecified: Secondary | ICD-10-CM | POA: Diagnosis not present

## 2021-08-22 HISTORY — PX: DILATATION & CURETTAGE/HYSTEROSCOPY WITH MYOSURE: SHX6511

## 2021-08-22 HISTORY — PX: OPERATIVE ULTRASOUND: SHX5996

## 2021-08-22 LAB — BASIC METABOLIC PANEL
Anion gap: 8 (ref 5–15)
BUN: 18 mg/dL (ref 8–23)
CO2: 24 mmol/L (ref 22–32)
Calcium: 8.8 mg/dL — ABNORMAL LOW (ref 8.9–10.3)
Chloride: 110 mmol/L (ref 98–111)
Creatinine, Ser: 0.65 mg/dL (ref 0.44–1.00)
GFR, Estimated: >60 mL/min (ref >60)
Glucose, Bld: 104 mg/dL — ABNORMAL HIGH (ref 70–99)
Potassium: 3.8 mmol/L (ref 3.5–5.1)
Sodium: 142 mmol/L (ref 135–145)

## 2021-08-22 LAB — CBC
HCT: 44 % (ref 36.0–46.0)
Hemoglobin: 14.6 g/dL (ref 12.0–15.0)
MCH: 29.5 pg (ref 26.0–34.0)
MCHC: 33.2 g/dL (ref 30.0–36.0)
MCV: 88.9 fL (ref 80.0–100.0)
Platelets: 215 10*3/uL (ref 150–400)
RBC: 4.95 MIL/uL (ref 3.87–5.11)
RDW: 13.7 % (ref 11.5–15.5)
WBC: 6.8 10*3/uL (ref 4.0–10.5)
nRBC: 0 % (ref 0.0–0.2)

## 2021-08-22 SURGERY — DILATATION & CURETTAGE/HYSTEROSCOPY WITH MYOSURE
Anesthesia: General

## 2021-08-22 MED ORDER — HYDRALAZINE HCL 20 MG/ML IJ SOLN
INTRAMUSCULAR | Status: AC
  Filled 2021-08-22: qty 1

## 2021-08-22 MED ORDER — DEXAMETHASONE SODIUM PHOSPHATE 10 MG/ML IJ SOLN
INTRAMUSCULAR | Status: DC | PRN
  Administered 2021-08-22: 5 mg via INTRAVENOUS

## 2021-08-22 MED ORDER — OXYCODONE HCL 5 MG PO TABS: 5.0000 mg | ORAL_TABLET | Freq: Once | ORAL | Status: DC | PRN

## 2021-08-22 MED ORDER — LIDOCAINE 2% (20 MG/ML) 5 ML SYRINGE
INTRAMUSCULAR | Status: DC | PRN
  Administered 2021-08-22: 100 mg via INTRAVENOUS

## 2021-08-22 MED ORDER — FENTANYL CITRATE (PF) 250 MCG/5ML IJ SOLN
INTRAMUSCULAR | Status: DC | PRN
Start: 2021-08-22 — End: 2021-08-22
  Administered 2021-08-22 (×2): 25 ug via INTRAVENOUS

## 2021-08-22 MED ORDER — ONDANSETRON HCL 4 MG/2ML IJ SOLN
INTRAMUSCULAR | Status: DC | PRN
  Administered 2021-08-22: 4 mg via INTRAVENOUS

## 2021-08-22 MED ORDER — EPHEDRINE SULFATE (PRESSORS) 50 MG/ML IJ SOLN
INTRAMUSCULAR | Status: DC | PRN
  Administered 2021-08-22 (×2): 5 mg via INTRAVENOUS

## 2021-08-22 MED ORDER — SODIUM CHLORIDE 0.9 % IR SOLN
Status: DC | PRN
  Administered 2021-08-22: 950 mL

## 2021-08-22 MED ORDER — PHENYLEPHRINE 80 MCG/ML (10ML) SYRINGE FOR IV PUSH (FOR BLOOD PRESSURE SUPPORT)
PREFILLED_SYRINGE | INTRAVENOUS | Status: DC | PRN
  Administered 2021-08-22 (×2): 40 ug via INTRAVENOUS

## 2021-08-22 MED ORDER — HYDRALAZINE HCL 20 MG/ML IJ SOLN
10.0000 mg | Freq: Once | INTRAMUSCULAR | Status: AC
  Administered 2021-08-22: 10 mg via INTRAVENOUS

## 2021-08-22 MED ORDER — LACTATED RINGERS IV SOLN
INTRAVENOUS | Status: DC
Start: 2021-08-22 — End: 2021-08-22

## 2021-08-22 MED ORDER — MIDAZOLAM HCL 2 MG/2ML IJ SOLN
INTRAMUSCULAR | Status: AC
  Filled 2021-08-22: qty 2

## 2021-08-22 MED ORDER — FENTANYL CITRATE (PF) 100 MCG/2ML IJ SOLN
INTRAMUSCULAR | Status: AC
  Filled 2021-08-22: qty 2

## 2021-08-22 MED ORDER — POVIDONE-IODINE 10 % EX SWAB: 2.0000 | Freq: Once | CUTANEOUS | Status: DC

## 2021-08-22 MED ORDER — KETOROLAC TROMETHAMINE 30 MG/ML IJ SOLN
INTRAMUSCULAR | Status: DC | PRN
  Administered 2021-08-22: 30 mg via INTRAVENOUS

## 2021-08-22 MED ORDER — AMISULPRIDE (ANTIEMETIC) 5 MG/2ML IV SOLN: 5.0000 mg | Freq: Once | INTRAVENOUS | Status: DC | PRN

## 2021-08-22 MED ORDER — ACETAMINOPHEN 500 MG PO TABS
ORAL_TABLET | ORAL | Status: AC
  Filled 2021-08-22: qty 2

## 2021-08-22 MED ORDER — PROPOFOL 10 MG/ML IV BOLUS
INTRAVENOUS | Status: AC
  Filled 2021-08-22: qty 20

## 2021-08-22 MED ORDER — MIDAZOLAM HCL 5 MG/5ML IJ SOLN
INTRAMUSCULAR | Status: DC | PRN
  Administered 2021-08-22: .5 mg via INTRAVENOUS

## 2021-08-22 MED ORDER — OXYCODONE HCL 5 MG/5ML PO SOLN: 5.0000 mg | Freq: Once | ORAL | Status: DC | PRN

## 2021-08-22 MED ORDER — LIDOCAINE HCL 1 % IJ SOLN
INTRAMUSCULAR | Status: DC | PRN
  Administered 2021-08-22: 10 mL

## 2021-08-22 MED ORDER — IBUPROFEN 600 MG PO TABS: 600.0000 mg | ORAL_TABLET | Freq: Four times a day (QID) | ORAL | 0 refills | Status: DC | PRN

## 2021-08-22 MED ORDER — FENTANYL CITRATE (PF) 100 MCG/2ML IJ SOLN: 25.0000 ug | INTRAMUSCULAR | Status: DC | PRN

## 2021-08-22 MED ORDER — PROPOFOL 10 MG/ML IV BOLUS
INTRAVENOUS | Status: DC | PRN
  Administered 2021-08-22: 150 mg via INTRAVENOUS

## 2021-08-22 MED ORDER — LACTATED RINGERS IV SOLN: INTRAVENOUS | Status: DC

## 2021-08-22 MED ORDER — ACETAMINOPHEN 500 MG PO TABS
1000.0000 mg | ORAL_TABLET | Freq: Once | ORAL | Status: AC
  Administered 2021-08-22: 1000 mg via ORAL

## 2021-08-22 SURGICAL SUPPLY — 21 items
CATH ROBINSON RED A/P 16FR (CATHETERS) ×2 IMPLANT
DEVICE MYOSURE LITE (MISCELLANEOUS) IMPLANT
DEVICE MYOSURE REACH (MISCELLANEOUS) ×1 IMPLANT
DILATOR CANAL MILEX (MISCELLANEOUS) IMPLANT
DRSG TEGADERM 4X4.75 (GAUZE/BANDAGES/DRESSINGS) ×1 IMPLANT
DRSG TELFA 3X8 NADH (GAUZE/BANDAGES/DRESSINGS) ×2 IMPLANT
GAUZE 4X4 16PLY ~~LOC~~+RFID DBL (SPONGE) ×3 IMPLANT
GAUZE SPONGE 4X4 12PLY STRL (GAUZE/BANDAGES/DRESSINGS) ×1 IMPLANT
GLOVE BIO SURGEON STRL SZ 6.5 (GLOVE) ×3 IMPLANT
GOWN STRL REUS W/TWL LRG LVL3 (GOWN DISPOSABLE) ×2 IMPLANT
IV NS IRRIG 3000ML ARTHROMATIC (IV SOLUTION) ×2 IMPLANT
KIT PROCEDURE FLUENT (KITS) ×2 IMPLANT
KIT TURNOVER CYSTO (KITS) ×2 IMPLANT
MYOSURE XL FIBROID (MISCELLANEOUS)
PACK VAGINAL MINOR WOMEN LF (CUSTOM PROCEDURE TRAY) ×2 IMPLANT
PAD DRESSING TELFA 3X8 NADH (GAUZE/BANDAGES/DRESSINGS) ×1 IMPLANT
PAD OB MATERNITY 4.3X12.25 (PERSONAL CARE ITEMS) ×2 IMPLANT
SEAL CERVICAL OMNI LOK (ABLATOR) IMPLANT
SEAL ROD LENS SCOPE MYOSURE (ABLATOR) ×2 IMPLANT
SYSTEM TISS REMOVAL MYOSURE XL (MISCELLANEOUS) IMPLANT
TOWEL OR 17X26 10 PK STRL BLUE (TOWEL DISPOSABLE) ×2 IMPLANT

## 2021-08-22 NOTE — Telephone Encounter (Signed)
Encounter closed

## 2021-08-22 NOTE — Anesthesia Postprocedure Evaluation (Signed)
Anesthesia Post Note  Patient: Kerry [name]  Procedure(s) Performed: DILATATION & CURETTAGE/HYSTEROSCOPY WITH MYOSURE RESECTION OF ENDOMETRIAL MASS OPERATIVE ULTRASOUND GUIDED HYSTEROSCOPY     Patient location during evaluation: PACU Anesthesia Type: General Level of consciousness: sedated and patient cooperative Pain management: pain level controlled Vital Signs Assessment: post-procedure vital signs reviewed and stable Respiratory status: spontaneous breathing Cardiovascular status: stable Anesthetic complications: no Comments: Discussed in detail how she needs to F/U with PCP ASAP and check BP at home.   No notable events documented.  Last Vitals:  Vitals:   08/22/21 0954 08/22/21 1000  BP: (!) 195/82 (!) 191/81  Pulse: 83   Resp:    Temp:    SpO2: 95%     Last Pain:  Vitals:   08/22/21 0954  TempSrc:   PainSc: 0-No pain                 Nolon Nations

## 2021-08-22 NOTE — Discharge Instructions (Addendum)
Hi Ms. Raphael,   I was able to get inside your uterine cavity without any difficulty.   I found what appeared to be probable fibroids.  I did biopsy each of the areas.  I also did the curettage of the surrounding tissue.   Biopsy reports will do directly to your My Chart account.   Everything went well today!  Kerry Half, MD   Post Anesthesia Home Care Instructions  Activity: Get plenty of rest for the remainder of the day. A responsible individual must stay with you for 24 hours following the procedure.  For the next 24 hours, DO NOT: -Drive a car -Paediatric nurse -Drink alcoholic beverages -Take any medication unless instructed by your physician -Make any legal decisions or sign important papers.  Meals: Start with liquid foods such as gelatin or soup. Progress to regular foods as tolerated. Avoid greasy, spicy, heavy foods. If nausea and/or vomiting occur, drink only clear liquids until the nausea and/or vomiting subsides. Call your physician if vomiting continues.  Special Instructions/Symptoms: Your throat may feel dry or sore from the anesthesia or the breathing tube placed in your throat during surgery. If this causes discomfort, gargle with warm salt water. The discomfort should disappear within 24 hours.  No acetaminophen/Tylenol until after 12:25 pm today if needed.   No ibuprofen, Advil, Aleve, Motrin, ketorolac, meloxicam, naproxen, or other NSAIDS until after 2:25 pm today if needed.

## 2021-08-22 NOTE — Transfer of Care (Signed)
Immediate Anesthesia Transfer of Care Note  Patient: Kerry [name]  Procedure(s) Performed: DILATATION & CURETTAGE/HYSTEROSCOPY WITH MYOSURE RESECTION OF ENDOMETRIAL MASS OPERATIVE ULTRASOUND GUIDED HYSTEROSCOPY  Patient Location: PACU  Anesthesia Type:General  Level of Consciousness: awake, alert  and oriented  Airway & Oxygen Therapy: Patient Spontanous Breathing and Patient connected to nasal cannula oxygen  Post-op Assessment: Report given to RN and Post -op Vital signs reviewed and stable  Post vital signs: Reviewed and stable  Last Vitals:  Vitals Value Taken Time  BP 189/88 08/22/21 0835  Temp 36.4 C 08/22/21 0835  Pulse 74 08/22/21 0845  Resp 15 08/22/21 0845  SpO2 99 % 08/22/21 0845  Vitals shown include unvalidated device data.  Last Pain:  Vitals:   08/22/21 0835  TempSrc:   PainSc: 0-No pain      Patients Stated Pain Goal: 6 (81/27/51 7001)  Complications: No notable events documented.

## 2021-08-22 NOTE — Op Note (Signed)
OPERATIVE REPORT  PREOPERATIVE DIAGNOSES:   Postmenopausal bleeding, thickened endometrium, cervical stenosis.   POSTOPERATIVE DIAGNOSES:   Postmenopausal bleeding, thickened endometrium, cervical stenosis.   PROCEDURE:  Ultrasound guided hysteroscopy with dilation and curettage and Myosure resection of endometrial masses  SURGEON:  Lenard Galloway, MD  ANESTHESIA:  LMA, paracervical block with 10 mL of 1% lidocaine.  IV FLUIDS:  450 cc LR  EBL:   120 cc  URINE OUTPUT:   Patient voided prior to procedure  NORMAL SALINE DEFICIT:   20 cc   COMPLICATIONS:  None.  INDICATIONS FOR THE PROCEDURE:     The patient is a 71 year old G72P2002 Caucasian female who presents with postmenopausal bleeding.  Pelvic ultrasound showed an endometrium measuring 5 mm and fibroids, including a 1.7 cm submucosal fibroid.   Endometrial biopsy in the office was not possible due to cervical stenosis.   A plan is now made to proceed with a hysteroscopy with dilation and curettage and Myosurgical resection of endometrial mass; after risks, benefits and alternatives were reviewed.  FINDINGS:  Exam under anesthesia revealed a small anteverted, mobile uterus.  No adnexal masses were noted.  The uterus was sounded to 8 cm Hysteroscopy showed a 2.5 cm anterior fundal mass consistent with probable intramural fibroid that protruded partially into the endometrial canal.  This mass was biopsied but not removed.  There was also a 1 cm anterior lower uterine segment mass consistent with probably submucosal fibroid.  This was essentially removed.    The tubal ostia regions were somewhat obscured by scar tissue.  Endometrial currettings were scant.   SPECIMENS:   The endometrial masses were sent as one surgical specimen and the endometrial curettings were sent as a separate specimen.   PROCEDURE IN DETAIL:  The patient was reidentified in the preoperative hold area.  She received TED hose and PAS stockings for DVT  prophylaxis.  In the operating room, the patient was placed in the dorsal lithotomy position and then an LMA anesthetic was introduced.  The patient's lower abdomen, vagina and perineum were sterilely prepped with Betadine and the  patient's bladder was catheterized of urine.  She was sterilly draped.  An exam under anesthesia was performed.  Transabdominal ultrasound was used to guide the dilation of the cervix.   A speculum was placed inside the vagina and a single-tooth tenaculum was placed on the anterior cervical lip.  A paracervical block was performed with a total of 10 mL of 1% lidocaine plain.  The uterus was sounded. The cervix was dilated to a #19 Pratt dilator.  The MyoSure hysteroscope was then inserted inside the uterine cavity under the continuous infusion of normal saline solution.   Findings are as noted above.  The Myosure Reach device was used to biopsy the anterior fundal mass and remove the anterior lower uterine segment mass without difficulty. This specimen was sent to Pathology.  The MyoSure hysteroscope was then removed. The serrated curette was introduced into the uterine cavity and the endometrium was curetted in all 4 quadrants.   A scant amount of endometrial curettings was obtained.  This specimen was sent to Pathology separately.  The single-tooth tenaculum which had been placed on the anterior cervical lip was removed.    Silver nitrate was used to treat bleeding from the right tenaculum site.   Hemostasis was then good, and all of the vaginal instruments were removed.  The patient was awakened and escorted to the recovery room in stable condition after  she was cleansed of Betadine.  There were no complications to the procedure.   All needle, instrument and sponge counts were correct.  Lenard Galloway, MD

## 2021-08-22 NOTE — Progress Notes (Signed)
Update  Patient presents for hysteroscopy.  Denies chest pain and shortness of breath.  Patient saw her PCP for evaluation of elevated BP prior to surgery.   She was not started on any high blood pressure medication.   Has elevated blood pressure.  Reports pain in her arms when her blood pressure is being taken.  Left arm automated BP:  218/104, 222/98 Right arm automated BP:  202/91 Manual BP:  200/90  CBC:  WBC 6.8, Hgb 14.6, platelets 215,000  Anesthesia is now in to see patient to assess fitness for surgery.

## 2021-08-22 NOTE — Anesthesia Procedure Notes (Signed)
Procedure Name: LMA Insertion Date/Time: 08/22/2021 7:50 AM  Performed by: Clearnce Sorrel, CRNAPre-anesthesia Checklist: Patient identified, Emergency Drugs available, Suction available and Patient being monitored Patient Re-evaluated:Patient Re-evaluated prior to induction Oxygen Delivery Method: Circle System Utilized Preoxygenation: Pre-oxygenation with 100% oxygen Induction Type: IV induction Ventilation: Mask ventilation without difficulty LMA: LMA inserted LMA Size: 4.0 Number of attempts: 1 Airway Equipment and Method: Bite block Placement Confirmation: positive ETCO2 Tube secured with: Tape Dental Injury: Teeth and Oropharynx as per pre-operative assessment

## 2021-08-23 ENCOUNTER — Encounter (HOSPITAL_BASED_OUTPATIENT_CLINIC_OR_DEPARTMENT_OTHER): Payer: Self-pay | Admitting: Obstetrics and Gynecology

## 2021-08-23 LAB — SURGICAL PATHOLOGY

## 2021-09-06 NOTE — Progress Notes (Unsigned)
Kerry [name] Phone: [phone] Subjective:   Kerry [name], am serving as a scribe for Dr. Hulan Saas.   I'm seeing this patient by the request  of:  Street, [name], MD  CC: Neck and back pain follow-up, knee pain follow-up  [id number]  Kerry [name] is a 71 y.o. female coming in with complaint of back and neck pain. OMT on 08/02/2021. Patient states that she does feel some improvement but does still have sharp pain if she hits the wrong angle.   Also c/o L shoulder pain after having BP taken at hospital during a procedure. Pain is more achy and she did fall asleep with her head turned to the R and she had some numbness in L hand.   Medications patient has been prescribed: None  Taking:         Reviewed prior external information including notes and imaging from previsou exam, outside providers and external EMR if available.   As well as notes that were available from care everywhere and other healthcare systems.  Past medical history, social, surgical and family history all reviewed in electronic medical record.  [name] pertanent information unless stated regarding to the chief complaint.   Past Medical History:  Diagnosis Date   Abnormal uterine bleeding    Allergic rhinitis, cause unspecified    Arthritis of knee, right    Bursitis of left shoulder    Cherry hemangioma 07/28/15   vulva   Chronic pain    Dyspareunia    Elevated cholesterol    Elevated uric acid in blood 2021   Fibroid    Foot fracture, left 01/2015   and torn tendons   GERD (gastroesophageal reflux disease)    HTN (hypertension)    Kidney stones    Sleep apnea    Urinary incontinence     Allergies  Allergen Reactions   Meloxicam Other (See Comments)    Made patient have stomach pain ,sore throat, joint pains, increase body temp.   Molds & Smuts Other (See Comments), Palpitations, Shortness Of Breath  and Swelling   Cefixime Swelling   Celebrex [Celecoxib] Swelling   Cobalt Other (See Comments)    Per allergist   Gabapentin Other (See Comments)    Joint pain   Levofloxacin Other (See Comments)    Muscle and joint pain   Molybdenum Other (See Comments)    Per allergist   Other Other (See Comments)    TEQUIN. TEQUIN - JOINT AND MUSCLE PAIN Other Reaction: painful joints TEQUIN. TEQUIN - JOINT AND MUSCLE PAIN Tantal Metal   Penicillins Hives   Robaxin [Methocarbamol]    Tomato Hives   Adhesive [Tape] Rash    Sensitive to EKG leads; sensitive to 4M Micropore tape   Dust Mite Extract Itching, Other (See Comments) and Palpitations   Tetracycline Hcl Rash    Can take Doxy   Tetracyclines & Related Rash     Review of Systems:  [name] headache, visual changes, nausea, vomiting, diarrhea, constipation, dizziness, abdominal pain, skin rash, fevers, chills, night sweats, weight loss, swollen lymph nodes, body aches, joint swelling, chest pain, shortness of breath, mood changes. POSITIVE muscle aches  Objective  Blood pressure (!) 148/84, pulse 76, height '4\' 11"'$  (1.499 m), weight 219 lb (99.3 kg), last menstrual period 08/16/1992, SpO2 97 %.   General: [name] apparent distress alert and oriented x3 mood and affect normal, dressed appropriately.  HEENT: Pupils equal, extraocular  movements intact  Respiratory: Patient's speak in full sentences and does not appear short of breath  Cardiovascular: [name] lower extremity edema, non tender, [name] erythema  Gait still severely antalgic but significantly better balance MSK:  Back does have some loss of lordosis. Positive FABER Negative SLT   Osteopathic findings  C2 flexed rotated and side bent right C5 flexed rotated and side bent left T3 extended rotated and side bent right inhaled rib T8 extended rotated and side bent left L2 flexed rotated and side bent right Sacrum right on right  Limited muscular skeletal ultrasound was performed and  interpreted by Hulan Saas, M  Limited ultrasound of patient's right knee is less hypoechoic changes then in previous exam.  Still significant arthritic changes noted in all joints though. Impression: Interval improvement   Assessment and Plan:  Arthritis of knee, degenerative Patient on exam and as well as on the ultrasound had significant decrease in the effusion of the knee that seems to be usually her baseline.  Possible improvement in the gait as well as with range of motion.  Discussed we can consider repeating PRP if necessary.    Nonallopathic problems  Decision today to treat with OMT was based on Physical Exam  After verbal consent patient was treated with HVLA, ME, FPR techniques in cervical, rib, thoracic, lumbar, and sacral  areas  Patient tolerated the procedure well with improvement in symptoms  Patient given exercises, stretches and lifestyle modifications  See medications in patient instructions if given  Patient will follow up in 4-8 weeks    The above documentation has been reviewed and is accurate and complete [name], DO          Note: This dictation was prepared with Dragon dictation along with smaller phrase technology. Any transcriptional errors that result from this process are unintentional.

## 2021-09-06 NOTE — Progress Notes (Unsigned)
GYNECOLOGY  VISIT   HPI: 71 y.o.   Married  Caucasian  female   G2P2002 with Patient's last menstrual period was 08/16/1992.   here for 2 weeks status post    DILATATION & CURETTAGE/HYSTEROSCOPY WITH MYOSURE RESECTION OF ENDOMETRIAL MASS  OPERATIVE ULTRASOUND GUIDED HYSTEROSCOPY Procedures.    GYNECOLOGIC HISTORY: Patient's last menstrual period was 08/16/1992. Contraception:  PMP Menopausal hormone therapy:  none Last mammogram:  09-09-20 3D/neg/BiRads1 Last pap smear: 09-15-20 Neg:Neg HR HPV, 08-07-18 Neg, 07-28-16 Neg        OB History     Gravida  2   Para  2   Term  2   Preterm      AB      Living  2      SAB      IAB      Ectopic      Multiple      Live Births                 Patient Active Problem List   Diagnosis Date Noted   Degenerative cervical disc 04/10/2019   Carpal tunnel syndrome on left 02/27/2019   Left lumbar radiculopathy 12/28/2017   Nonallopathic lesion of thoracic region 08/02/2017   Swelling of both lower extremities 01/18/2017   Nonallopathic lesion of sacral region 01/03/2017   Allergic contact dermatitis due to metals 12/16/2016   Abnormal gait 04/10/2016   Dyslipidemia 08/12/2015   Dyspnea on exertion 08/12/2015   Palpitations 08/12/2015   Pes anserine bursitis 08/10/2015   Rupture of left posterior tibialis tendon 03/25/2015   OSA (obstructive sleep apnea) 03/05/2015   Navicular fracture of ankle 01/21/2015   Sinus infection 12/25/2014   Uric acid arthropathy 03/23/2014   High plasma oxalate 02/17/2014   Nonallopathic lesion of cervical region 12/19/2012   Fibroids 05/28/2012   Family history of ovarian cancer 05/28/2012   Strain of quadriceps muscle 04/17/2012   Nonallopathic lesion of costovertebral region 01/23/2012   Left shoulder pain 12/20/2011   Rotator cuff disorder 09/26/2011   Bilateral foot pain 09/20/2011   Arthralgia of multiple joints 07/26/2011   Arthritis of knee, degenerative 07/26/2011    Arthritis of hand, degenerative 07/26/2011   Low back pain 07/26/2011   Vitamin B 12 deficiency 03/05/2011   TMJ (temporomandibular joint syndrome) 03/05/2011   Pes planus 03/01/2011   Right knee pain 03/01/2011   Greater trochanteric bursitis of left hip 03/01/2011   Nonallopathic lesion of lumbosacral region 01/27/2011   Obesity 12/29/2010   Fatigue 12/29/2010   Allergic rhinitis, cause unspecified    Chronic pain    GERD (gastroesophageal reflux disease)    Essential hypertension    Sleep apnea     Past Medical History:  Diagnosis Date   Abnormal uterine bleeding    Allergic rhinitis, cause unspecified    Arthritis of knee, right    Bursitis of left shoulder    Cherry hemangioma 07/28/15   vulva   Chronic pain    Dyspareunia    Elevated cholesterol    Elevated uric acid in blood 2021   Fibroid    Foot fracture, left 01/2015   and torn tendons   GERD (gastroesophageal reflux disease)    HTN (hypertension)    Kidney stones    Sleep apnea    Urinary incontinence     Past Surgical History:  Procedure Laterality Date    dilatation and curettage  2000   CARPAL TUNNEL RELEASE  1998   CARPAL TUNNEL  RELEASE Left 05/21/2020   CATARACT EXTRACTION Bilateral    dental implants  2010   DENTAL SURGERY     DILATATION & CURETTAGE/HYSTEROSCOPY WITH MYOSURE N/A 08/22/2021   Procedure: DILATATION & CURETTAGE/HYSTEROSCOPY WITH MYOSURE RESECTION OF ENDOMETRIAL MASS;  Surgeon: Nunzio Cobbs, MD;  Location: [facility];  Service: Gynecology;  Laterality: N/A;   ESOPHAGEAL DILATION  2010   HAMMER TOE SURGERY     rt   OPERATIVE ULTRASOUND N/A 08/22/2021   Procedure: OPERATIVE ULTRASOUND GUIDED HYSTEROSCOPY;  Surgeon: Nunzio Cobbs, MD;  Location: [facility];  Service: Gynecology;  Laterality: N/A;   PELVIC LAPAROSCOPY  1990   ROTATOR CUFF REPAIR Right 09/2011   Dr. Tamera Punt   ROTATOR CUFF REPAIR Left 11/11/2013   Dr. Tamera Punt     SEPTOPLASTY     UMBILICAL HERNIA REPAIR  2010   Patient reports mesh was used for the repair.    Current Outpatient Medications  Medication Sig Dispense Refill   ascorbic acid (VITAMIN C) 500 MG tablet Take 250 mg by mouth daily.     Cholecalciferol (VITAMIN D3) 5000 units CAPS Take 5,000 Units by mouth. Every other day     Cyanocobalamin (VITAMIN B-12) 1000 MCG SUBL Place 1 tablet under the tongue 4 (four) times a week.     ibuprofen (ADVIL) 600 MG tablet Take 1 tablet (600 mg total) by mouth every 6 (six) hours as needed. 30 tablet 0   Menatetrenone (VITAMIN K2) 100 MCG TABS Take 180 mcg by mouth. Every 6th day. MK-7     Misc Natural Products (TART CHERRY ADVANCED PO) Take 800 mg by mouth daily.     MISC NATURAL PRODUCTS PO Take by mouth. Black currant seed oil     Multiple Vitamins-Minerals (SUPER MULTI-VITAMIN PO) Take 1 capsule by mouth daily. Solgar Formula VM-75     Omega-3 Fatty Acids (FISH OIL) 1000 MG CAPS Take by mouth.     Probiotic Product (PROBIOTIC ADVANCED PO) Take by mouth. Patient states taking 5 billion, one capsule daily. Suprema Dophilus probiotic     Sodium Chloride-Xylitol (XLEAR SINUS CARE SPRAY NA) Place into the nose. 2 sprays at bedtime     No current facility-administered medications for this visit.     ALLERGIES: Meloxicam, Molds & smuts, Cefixime, Celebrex [celecoxib], Cobalt, Gabapentin, Levofloxacin, Molybdenum, Other, Penicillins, Robaxin [methocarbamol], Tomato, Adhesive [tape], Dust mite extract, Tetracycline hcl, and Tetracyclines & related  Family History  Problem Relation Age of Onset   Rheumatologic disease Mother    Diabetes Mother    Heart attack Father    Rheumatologic disease Sister    Arthritis Sister        --Rheumatoid arthritis   Ovarian cancer Sister 38   Crohn's disease Brother    Kidney failure Brother    Ulcerative colitis Brother    Glaucoma Brother     Social History   Socioeconomic History   Marital status: Married     Spouse name: Not on file   Number of children: Not on file   Years of education: Not on file   Highest education level: Not on file  Occupational History   Occupation: Retired  Tobacco Use   Smoking status: Never   Smokeless tobacco: Never  Vaping Use   Vaping Use: Never used  Substance and Sexual Activity   Alcohol use: No    Alcohol/week: 0.0 standard drinks of alcohol   Drug use: No   Sexual activity: Not  Currently    Partners: Male    Birth control/protection: Post-menopausal  Other Topics Concern   Not on file  Social History Narrative   Not on file   Social Determinants of Health   Financial Resource Strain: Not on file  Food Insecurity: Not on file  Transportation Needs: Not on file  Physical Activity: Not on file  Stress: Not on file  Social Connections: Not on file  Intimate Partner Violence: Not on file    Review of Systems  PHYSICAL EXAMINATION:    LMP 08/16/1992     General appearance: alert, cooperative and appears stated age Head: Normocephalic, without obvious abnormality, atraumatic Neck: no adenopathy, supple, symmetrical, trachea midline and thyroid normal to inspection and palpation Lungs: clear to auscultation bilaterally Breasts: normal appearance, no masses or tenderness, No nipple retraction or dimpling, No nipple discharge or bleeding, No axillary or supraclavicular adenopathy Heart: regular rate and rhythm Abdomen: soft, non-tender, no masses,  no organomegaly Extremities: extremities normal, atraumatic, no cyanosis or edema Skin: Skin color, texture, turgor normal. No rashes or lesions Lymph nodes: Cervical, supraclavicular, and axillary nodes normal. No abnormal inguinal nodes palpated Neurologic: Grossly normal  Pelvic: External genitalia:  no lesions              Urethra:  normal appearing urethra with no masses, tenderness or lesions              Bartholins and Skenes: normal                 Vagina: normal appearing vagina with  normal color and discharge, no lesions              Cervix: no lesions                Bimanual Exam:  Uterus:  normal size, contour, position, consistency, mobility, non-tender              Adnexa: no mass, fullness, tenderness              Rectal exam: {yes no:[id number]}.  Confirms.              Anus:  normal sphincter tone, no lesions  Chaperone was present for exam:  ***  ASSESSMENT     PLAN     An After Visit Summary was printed and given to the patient.  ______ minutes face to face time of which over 50% was spent in counseling.

## 2021-09-07 ENCOUNTER — Ambulatory Visit (INDEPENDENT_AMBULATORY_CARE_PROVIDER_SITE_OTHER): Payer: Medicare Other | Admitting: Family Medicine

## 2021-09-07 ENCOUNTER — Telehealth: Payer: Self-pay | Admitting: Obstetrics and Gynecology

## 2021-09-07 ENCOUNTER — Encounter: Payer: Self-pay | Admitting: Obstetrics and Gynecology

## 2021-09-07 ENCOUNTER — Ambulatory Visit (INDEPENDENT_AMBULATORY_CARE_PROVIDER_SITE_OTHER): Payer: Medicare Other | Admitting: Obstetrics and Gynecology

## 2021-09-07 ENCOUNTER — Ambulatory Visit: Payer: Self-pay

## 2021-09-07 VITALS — BP 184/82 | HR 100 | Ht 59.0 in | Wt 216.0 lb

## 2021-09-07 VITALS — BP 148/84 | HR 76 | Ht 59.0 in | Wt 219.0 lb

## 2021-09-07 DIAGNOSIS — N3946 Mixed incontinence: Secondary | ICD-10-CM

## 2021-09-07 DIAGNOSIS — M25561 Pain in right knee: Secondary | ICD-10-CM | POA: Diagnosis not present

## 2021-09-07 DIAGNOSIS — M9902 Segmental and somatic dysfunction of thoracic region: Secondary | ICD-10-CM | POA: Diagnosis not present

## 2021-09-07 DIAGNOSIS — M9908 Segmental and somatic dysfunction of rib cage: Secondary | ICD-10-CM | POA: Diagnosis not present

## 2021-09-07 DIAGNOSIS — M170 Bilateral primary osteoarthritis of knee: Secondary | ICD-10-CM

## 2021-09-07 DIAGNOSIS — G8929 Other chronic pain: Secondary | ICD-10-CM

## 2021-09-07 DIAGNOSIS — M9903 Segmental and somatic dysfunction of lumbar region: Secondary | ICD-10-CM

## 2021-09-07 DIAGNOSIS — M9901 Segmental and somatic dysfunction of cervical region: Secondary | ICD-10-CM | POA: Diagnosis not present

## 2021-09-07 DIAGNOSIS — M9904 Segmental and somatic dysfunction of sacral region: Secondary | ICD-10-CM | POA: Diagnosis not present

## 2021-09-07 DIAGNOSIS — Z9889 Other specified postprocedural states: Secondary | ICD-10-CM

## 2021-09-07 NOTE — Telephone Encounter (Signed)
Referral placed in Epic for urogyn.

## 2021-09-07 NOTE — Telephone Encounter (Signed)
Please see the patient Kerry [name] note with an addendum dated for today.   The patient was seen in the office today for her post op visit. She has recovered well from her hysteroscopy procedure.   I made a referral for her to see Dr. Wannetta Sender in urogynecology.   She will see her PCP on September 26, 2021.  I recommended she address her medical concerns at that time.  She will need her blood pressure rechecked then.

## 2021-09-07 NOTE — Telephone Encounter (Signed)
Please make a referral to Dr. Wannetta Sender for mixed incontinence.

## 2021-09-07 NOTE — Patient Instructions (Signed)
See me again in 6 weeks 

## 2021-09-08 ENCOUNTER — Encounter: Payer: Self-pay | Admitting: Family Medicine

## 2021-09-08 NOTE — Assessment & Plan Note (Signed)
Patient on exam and as well as on the ultrasound had significant decrease in the effusion of the knee that seems to be usually her baseline.  Possible improvement in the gait as well as with range of motion.  Discussed we can consider repeating PRP if necessary.

## 2021-09-08 NOTE — Telephone Encounter (Signed)
MyChart message encounter dated 08/14/21 reviewed. Encounter previously closed.   Will close this Encounter.

## 2021-09-08 NOTE — Telephone Encounter (Signed)
MyChart message reviewed by Dr. Quincy Simmonds and discussed with patient at 09/07/21 OV.   Referral placed to Urogynecology PCP appt 09/26/21.

## 2021-09-15 DIAGNOSIS — E538 Deficiency of other specified B group vitamins: Secondary | ICD-10-CM | POA: Diagnosis not present

## 2021-09-22 DIAGNOSIS — D1721 Benign lipomatous neoplasm of skin and subcutaneous tissue of right arm: Secondary | ICD-10-CM | POA: Diagnosis not present

## 2021-09-22 DIAGNOSIS — L821 Other seborrheic keratosis: Secondary | ICD-10-CM | POA: Diagnosis not present

## 2021-09-22 DIAGNOSIS — L814 Other melanin hyperpigmentation: Secondary | ICD-10-CM | POA: Diagnosis not present

## 2021-09-22 DIAGNOSIS — D225 Melanocytic nevi of trunk: Secondary | ICD-10-CM | POA: Diagnosis not present

## 2021-09-26 DIAGNOSIS — E538 Deficiency of other specified B group vitamins: Secondary | ICD-10-CM | POA: Diagnosis not present

## 2021-09-26 DIAGNOSIS — I110 Hypertensive heart disease with heart failure: Secondary | ICD-10-CM | POA: Diagnosis not present

## 2021-09-26 DIAGNOSIS — Z6841 Body Mass Index (BMI) 40.0 and over, adult: Secondary | ICD-10-CM | POA: Diagnosis not present

## 2021-09-28 NOTE — Progress Notes (Signed)
[place] Phone: [phone] Subjective:   Kerry [name], am serving as a scribe for Dr. Hulan Saas.  I'm seeing this patient by the request  of:  Street, Kerry Mt, MD  CC: Knee pain, back pain follow-up  [id number]  Kerry [name] is a 71 y.o. female coming in with complaint of back and neck pain. OMT 09/07/2021. Also f/u for B knee pain. Patient states that PRP has been helpful. Able to walk with less pain. Continues to have L shoulder and scapular tightness and pain.   Medications patient has been prescribed: None  Taking:         Reviewed prior external information including notes and imaging from previsou exam, outside providers and external EMR if available.   As well as notes that were available from care everywhere and other healthcare systems.  Past medical history, social, surgical and family history all reviewed in electronic medical record.  [name] pertanent information unless stated regarding to the chief complaint.   Past Medical History:  Diagnosis Date   Abnormal uterine bleeding    Allergic rhinitis, cause unspecified    Arthritis of knee, right    Bursitis of left shoulder    Cherry hemangioma 07/28/15   vulva   Chronic pain    Dyspareunia    Elevated cholesterol    Elevated uric acid in blood 2021   Fibroid    Foot fracture, left 01/2015   and torn tendons   GERD (gastroesophageal reflux disease)    HTN (hypertension)    Kidney stones    Sleep apnea    Urinary incontinence     Allergies  Allergen Reactions   Meloxicam Other (See Comments)    Made patient have stomach pain ,sore throat, joint pains, increase body temp.   Molds & Smuts Other (See Comments), Palpitations, Shortness Of Breath and Swelling   Cefixime Swelling   Celebrex [Celecoxib] Swelling   Cobalt Other (See Comments)    Per allergist   Gabapentin Other (See Comments)    Joint pain    Levofloxacin Other (See Comments)    Muscle and joint pain   Molybdenum Other (See Comments)    Per allergist   Other Other (See Comments)    TEQUIN. TEQUIN - JOINT AND MUSCLE PAIN Other Reaction: painful joints TEQUIN. TEQUIN - JOINT AND MUSCLE PAIN Tantal Metal   Penicillins Hives   Robaxin [Methocarbamol]    Tomato Hives   Adhesive [Tape] Rash    Sensitive to EKG leads; sensitive to 69M Micropore tape   Dust Mite Extract Itching, Other (See Comments) and Palpitations   Tetracycline Hcl Rash    Can take Doxy   Tetracyclines & Related Rash     Review of Systems:  [name] headache, visual changes, nausea, vomiting, diarrhea, constipation, dizziness, abdominal pain, skin rash, fevers, chills, night sweats, weight loss, swollen lymph nodes, chest pain, shortness of breath, mood changes. POSITIVE muscle aches, body aches, joint swelling  Objective  Blood pressure (!) 152/82, pulse 82, height '4\' 11"'$  (1.499 m), last menstrual period 08/16/1992, SpO2 98 %.   General: [name] apparent distress alert and oriented x3 mood and affect normal, dressed appropriately.  HEENT: Pupils equal, extraocular movements intact  Respiratory: Patient's speak in full sentences and does not appear short of breath  Cardiovascular: [name] lower extremity edema, non tender, [name] erythema  Gait severely antalgic MSK: Left ankle is having more pain over the posterior tibialis  tendon.  Does have overpronation of the foot bilaterally of the hindfoot. Back does have loss of lordosis.  Significant limited range of motion.  Poor core strength still noted  Osteopathic findings  C2 flexed rotated and side bent right C3 flexed rotated and side bent left T3 extended rotated and side bent right inhaled rib T9 extended rotated and side bent left L2 flexed rotated and side bent right Sacrum right on right       Assessment and Plan:  Arthritis of knee, degenerative Patient has responded extremely well to the PRP at the moment.   Feels like the instability is significantly improved.  Discussed with patient to continue to monitor.  Can repeat as long as necessary.  Patient still wants to avoid any surgical intervention.  Left lumbar radiculopathy Chronic problem.  Will discuss with patient again.  We discussed different mechanics.  Still responds fairly well to osteopathic manipulation.  Follow-up again in 6 to 8 weeks    Nonallopathic problems  Decision today to treat with OMT was based on Physical Exam  After verbal consent patient was treated with , ME, FPR techniques in cervical, rib, thoracic, lumbar, and sacral  areas  Patient tolerated the procedure well with improvement in symptoms  Patient given exercises, stretches and lifestyle modifications  See medications in patient instructions if given  Patient will follow up in 4-8 weeks      The above documentation has been reviewed and is accurate and complete [name], DO      Note: This dictation was prepared with Dragon dictation along with smaller phrase technology. Any transcriptional errors that result from this process are unintentional.

## 2021-09-29 ENCOUNTER — Encounter: Payer: Self-pay | Admitting: Family Medicine

## 2021-09-29 ENCOUNTER — Ambulatory Visit (INDEPENDENT_AMBULATORY_CARE_PROVIDER_SITE_OTHER): Payer: Medicare Other | Admitting: Family Medicine

## 2021-09-29 VITALS — BP 152/82 | HR 82 | Ht 59.0 in

## 2021-09-29 DIAGNOSIS — M5416 Radiculopathy, lumbar region: Secondary | ICD-10-CM

## 2021-09-29 DIAGNOSIS — M9901 Segmental and somatic dysfunction of cervical region: Secondary | ICD-10-CM | POA: Diagnosis not present

## 2021-09-29 DIAGNOSIS — M9908 Segmental and somatic dysfunction of rib cage: Secondary | ICD-10-CM | POA: Diagnosis not present

## 2021-09-29 DIAGNOSIS — M9902 Segmental and somatic dysfunction of thoracic region: Secondary | ICD-10-CM | POA: Diagnosis not present

## 2021-09-29 DIAGNOSIS — M9903 Segmental and somatic dysfunction of lumbar region: Secondary | ICD-10-CM | POA: Diagnosis not present

## 2021-09-29 DIAGNOSIS — M170 Bilateral primary osteoarthritis of knee: Secondary | ICD-10-CM

## 2021-09-29 DIAGNOSIS — M9904 Segmental and somatic dysfunction of sacral region: Secondary | ICD-10-CM

## 2021-09-29 NOTE — Patient Instructions (Signed)
Ultra Olympus 500 See me when Ronalee Belts returns

## 2021-09-29 NOTE — Assessment & Plan Note (Signed)
Chronic problem.  Will discuss with patient again.  We discussed different mechanics.  Still responds fairly well to osteopathic manipulation.  Follow-up again in 6 to 8 weeks

## 2021-09-29 NOTE — Assessment & Plan Note (Signed)
Patient has responded extremely well to the PRP at the moment.  Feels like the instability is significantly improved.  Discussed with patient to continue to monitor.  Can repeat as long as necessary.  Patient still wants to avoid any surgical intervention.

## 2021-10-14 DIAGNOSIS — D510 Vitamin B12 deficiency anemia due to intrinsic factor deficiency: Secondary | ICD-10-CM | POA: Diagnosis not present

## 2021-10-17 NOTE — Telephone Encounter (Signed)
Appt scheduled 02/22/2022 at 2:00pm.

## 2021-11-09 DIAGNOSIS — H35073 Retinal telangiectasis, bilateral: Secondary | ICD-10-CM | POA: Diagnosis not present

## 2021-11-09 DIAGNOSIS — H532 Diplopia: Secondary | ICD-10-CM | POA: Diagnosis not present

## 2021-11-09 DIAGNOSIS — H5319 Other subjective visual disturbances: Secondary | ICD-10-CM | POA: Diagnosis not present

## 2021-11-09 DIAGNOSIS — H43811 Vitreous degeneration, right eye: Secondary | ICD-10-CM | POA: Diagnosis not present

## 2021-11-09 DIAGNOSIS — Z961 Presence of intraocular lens: Secondary | ICD-10-CM | POA: Diagnosis not present

## 2021-11-09 DIAGNOSIS — H35373 Puckering of macula, bilateral: Secondary | ICD-10-CM | POA: Diagnosis not present

## 2021-11-16 DIAGNOSIS — E538 Deficiency of other specified B group vitamins: Secondary | ICD-10-CM | POA: Diagnosis not present

## 2021-11-22 DIAGNOSIS — Z0000 Encounter for general adult medical examination without abnormal findings: Secondary | ICD-10-CM | POA: Diagnosis not present

## 2021-11-22 DIAGNOSIS — H8113 Benign paroxysmal vertigo, bilateral: Secondary | ICD-10-CM | POA: Diagnosis not present

## 2021-11-22 DIAGNOSIS — E785 Hyperlipidemia, unspecified: Secondary | ICD-10-CM | POA: Diagnosis not present

## 2021-11-22 DIAGNOSIS — Z79899 Other long term (current) drug therapy: Secondary | ICD-10-CM | POA: Diagnosis not present

## 2021-11-22 DIAGNOSIS — E538 Deficiency of other specified B group vitamins: Secondary | ICD-10-CM | POA: Diagnosis not present

## 2021-11-22 DIAGNOSIS — N1831 Chronic kidney disease, stage 3a: Secondary | ICD-10-CM | POA: Diagnosis not present

## 2021-11-22 DIAGNOSIS — E790 Hyperuricemia without signs of inflammatory arthritis and tophaceous disease: Secondary | ICD-10-CM | POA: Diagnosis not present

## 2021-11-22 DIAGNOSIS — R7309 Other abnormal glucose: Secondary | ICD-10-CM | POA: Diagnosis not present

## 2021-11-24 NOTE — Progress Notes (Signed)
[place] Phone: [phone] Subjective:   [name], am serving as a scribe for Dr. Hulan Saas.  I'm seeing this patient by the request  of:  Street, [name], MD  CC: Back pain and multiple other things  [id number]  Kerry [name] is a 71 y.o. female coming in with complaint of back and neck pain.  Patient states that she started having some increasing and neck pain again.  Patient states that this time it was associated with double vision as well as seem to be worse with more positional.  Patient states that also the double vision seem to only happen with right sided rotation of her eyes.         Reviewed prior external information including notes and imaging from previsou exam, outside providers and external EMR if available.   As well as notes that were available from care everywhere and other healthcare systems.  Past medical history, social, surgical and family history all reviewed in electronic medical record.  No pertanent information unless stated regarding to the chief complaint.   Past Medical History:  Diagnosis Date   Abnormal uterine bleeding    Allergic rhinitis, cause unspecified    Arthritis of knee, right    Bursitis of left shoulder    Cherry hemangioma 07/28/15   vulva   Chronic pain    Dyspareunia    Elevated cholesterol    Elevated uric acid in blood 2021   Fibroid    Foot fracture, left 01/2015   and torn tendons   GERD (gastroesophageal reflux disease)    HTN (hypertension)    Kidney stones    Sleep apnea    Urinary incontinence     Allergies  Allergen Reactions   Meloxicam Other (See Comments)    Made patient have stomach pain ,sore throat, joint pains, increase body temp.   Molds & Smuts Other (See Comments), Palpitations, Shortness Of Breath and Swelling   Cefixime Swelling   Celebrex [Celecoxib] Swelling   Cobalt Other (See Comments)     Per allergist   Gabapentin Other (See Comments)    Joint pain   Levofloxacin Other (See Comments)    Muscle and joint pain   Molybdenum Other (See Comments)    Per allergist   Other Other (See Comments)    TEQUIN. TEQUIN - JOINT AND MUSCLE PAIN Other Reaction: painful joints TEQUIN. TEQUIN - JOINT AND MUSCLE PAIN Tantal Metal   Penicillins Hives   Robaxin [Methocarbamol]    Tomato Hives   Adhesive [Tape] Rash    Sensitive to EKG leads; sensitive to 2M Micropore tape   Dust Mite Extract Itching, Other (See Comments) and Palpitations   Tetracycline Hcl Rash    Can take Doxy   Tetracyclines & Related Rash     Review of Systems:  No headache, visual changes, nausea, vomiting, diarrhea, constipation, dizziness, abdominal pain, skin rash, fevers, chills, night sweats, weight loss, swollen lymph nodes, body aches, joint swelling, chest pain, shortness of breath, mood changes. POSITIVE muscle aches  Objective  Blood pressure (!) 178/100, pulse (!) 112, height '4\' 11"'$  (1.499 m), weight 224 lb (101.6 kg), last menstrual period 08/16/1992, SpO2 97 %.   General: No apparent distress alert and oriented x3 mood and affect normal, dressed appropriately.  HEENT: Pupils equal, extraocular movements intact  Respiratory: Patient's speak in full sentences and does not appear short of breath  Cardiovascular: No lower extremity  edema, non tender, no erythema    Neck exam does have some loss lordosis.  Some increasing in dizziness she states is somewhat.  Neurovascular intact in the extremities.  Cranial nerve exam is unremarkable. Some nystagmus noted with right lateral gaze   Osteopathic findings T7 extended rotated and side bent right   Assessment and Plan:  Dizziness after extension of neck Patient is having dizziness with extension of the neck I am scared that there was potentially a vascular aspect to this.  Patient continues to have some double vision with right lateral gaze.  Discussed  with her as well as her husband about the possibility of going to an urgent care or really in the emergency room.  At this point with the patient's initial symptoms starting 2 weeks ago and patient is seeing some mild improvement they would like to consider the possibility of work-up as an outpatient setting.  We will get an MRI to further evaluate.  MRI angiogram of the neck as well as an MRI of the brain with and without contrast to see if there was any CVA that could have been potentially contributing.  We discussed that if any worsening symptoms to seek medical attention immediately.  Follow-up with me again in 6 to 8 weeks otherwise for other ailments.  Deferred the osteopathic manipulation especially of the neck the patient did have some soft tissue release of the thoracic spine    Nonallopathic problems  Decision today to treat with OMT was based on Physical Exam  After verbal consent patient was treated with HVLA, ME, FPR techniques in  , thoracic, areas  Patient tolerated the procedure well with improvement in symptoms  Patient given exercises, stretches and lifestyle modifications  See medications in patient instructions if given  Patient will follow up in 4-8 weeks     The above documentation has been reviewed and is accurate and complete Kerry Pulley, DO         Note: This dictation was prepared with Dragon dictation along with smaller phrase technology. Any transcriptional errors that result from this process are unintentional.

## 2021-11-29 ENCOUNTER — Encounter: Payer: Self-pay | Admitting: Family Medicine

## 2021-11-29 ENCOUNTER — Ambulatory Visit (INDEPENDENT_AMBULATORY_CARE_PROVIDER_SITE_OTHER): Payer: Medicare Other | Admitting: Family Medicine

## 2021-11-29 VITALS — BP 178/100 | HR 112 | Ht 59.0 in | Wt 224.0 lb

## 2021-11-29 DIAGNOSIS — H814 Vertigo of central origin: Secondary | ICD-10-CM

## 2021-11-29 DIAGNOSIS — R42 Dizziness and giddiness: Secondary | ICD-10-CM | POA: Insufficient documentation

## 2021-11-29 DIAGNOSIS — H5500 Unspecified nystagmus: Secondary | ICD-10-CM | POA: Diagnosis not present

## 2021-11-29 DIAGNOSIS — R519 Headache, unspecified: Secondary | ICD-10-CM

## 2021-11-29 DIAGNOSIS — R278 Other lack of coordination: Secondary | ICD-10-CM

## 2021-11-29 DIAGNOSIS — H532 Diplopia: Secondary | ICD-10-CM

## 2021-11-29 DIAGNOSIS — G450 Vertebro-basilar artery syndrome: Secondary | ICD-10-CM | POA: Diagnosis not present

## 2021-11-29 DIAGNOSIS — H539 Unspecified visual disturbance: Secondary | ICD-10-CM

## 2021-11-29 DIAGNOSIS — M9902 Segmental and somatic dysfunction of thoracic region: Secondary | ICD-10-CM

## 2021-11-29 DIAGNOSIS — R0602 Shortness of breath: Secondary | ICD-10-CM | POA: Diagnosis not present

## 2021-11-29 DIAGNOSIS — M542 Cervicalgia: Secondary | ICD-10-CM | POA: Diagnosis not present

## 2021-11-29 DIAGNOSIS — I10 Essential (primary) hypertension: Secondary | ICD-10-CM

## 2021-11-29 DIAGNOSIS — H81399 Other peripheral vertigo, unspecified ear: Secondary | ICD-10-CM | POA: Diagnosis not present

## 2021-11-29 NOTE — Assessment & Plan Note (Signed)
Patient is having dizziness with extension of the neck I am scared that there was potentially a vascular aspect to this.  Patient continues to have some double vision with right lateral gaze.  Discussed with her as well as her husband about the possibility of going to an urgent care or really in the emergency room.  At this point with the patient's initial symptoms starting 2 weeks ago and patient is seeing some mild improvement they would like to consider the possibility of work-up as an outpatient setting.  We will get an MRI to further evaluate.  MRI angiogram of the neck as well as an MRI of the brain with and without contrast to see if there was any CVA that could have been potentially contributing.  We discussed that if any worsening symptoms to seek medical attention immediately.  Follow-up with me again in 6 to 8 weeks otherwise for other ailments.  Deferred the osteopathic manipulation especially of the neck the patient did have some soft tissue release of the thoracic spine

## 2021-11-29 NOTE — Patient Instructions (Addendum)
Good to see you If symptoms worsen, please seek medical care at the emergency room MRI [phone] Follow up in 6 weeks

## 2021-11-30 ENCOUNTER — Telehealth: Payer: Self-pay

## 2021-12-04 ENCOUNTER — Ambulatory Visit
Admission: RE | Admit: 2021-12-04 | Discharge: 2021-12-04 | Disposition: A | Discharge status: Home / Self Care | Payer: Medicare Other | Source: Ambulatory Visit | Attending: Family Medicine | Admitting: Family Medicine

## 2021-12-04 DIAGNOSIS — R278 Other lack of coordination: Secondary | ICD-10-CM

## 2021-12-04 DIAGNOSIS — R42 Dizziness and giddiness: Secondary | ICD-10-CM

## 2021-12-04 DIAGNOSIS — H532 Diplopia: Secondary | ICD-10-CM

## 2021-12-04 MED ORDER — GADOPICLENOL 0.5 MMOL/ML IV SOLN
10.0000 mL | Freq: Once | INTRAVENOUS | Status: AC | PRN
  Administered 2021-12-04: 10 mL via INTRAVENOUS

## 2021-12-06 ENCOUNTER — Encounter: Payer: Self-pay | Admitting: Family Medicine

## 2021-12-07 ENCOUNTER — Encounter: Payer: Self-pay | Admitting: Family Medicine

## 2021-12-07 ENCOUNTER — Other Ambulatory Visit: Payer: Self-pay

## 2021-12-07 ENCOUNTER — Encounter: Payer: Self-pay | Admitting: Neurology

## 2021-12-07 DIAGNOSIS — Z23 Encounter for immunization: Secondary | ICD-10-CM | POA: Diagnosis not present

## 2021-12-07 DIAGNOSIS — R9389 Abnormal findings on diagnostic imaging of other specified body structures: Secondary | ICD-10-CM

## 2021-12-07 DIAGNOSIS — R2990 Unspecified symptoms and signs involving the nervous system: Secondary | ICD-10-CM

## 2021-12-14 NOTE — Telephone Encounter (Signed)
Spoke w/ patient

## 2021-12-15 ENCOUNTER — Encounter: Payer: Self-pay | Admitting: Internal Medicine

## 2021-12-15 DIAGNOSIS — Z136 Encounter for screening for cardiovascular disorders: Secondary | ICD-10-CM

## 2021-12-19 DIAGNOSIS — D485 Neoplasm of uncertain behavior of skin: Secondary | ICD-10-CM | POA: Diagnosis not present

## 2021-12-19 DIAGNOSIS — I8290 Acute embolism and thrombosis of unspecified vein: Secondary | ICD-10-CM | POA: Diagnosis not present

## 2021-12-20 ENCOUNTER — Other Ambulatory Visit: Payer: Medicare Other

## 2021-12-21 ENCOUNTER — Encounter: Payer: Self-pay | Admitting: Neurology

## 2021-12-21 ENCOUNTER — Ambulatory Visit (INDEPENDENT_AMBULATORY_CARE_PROVIDER_SITE_OTHER): Payer: Medicare Other | Admitting: Neurology

## 2021-12-21 VITALS — BP 168/100 | HR 106 | Ht 59.0 in | Wt 223.0 lb

## 2021-12-21 DIAGNOSIS — Z8673 Personal history of transient ischemic attack (TIA), and cerebral infarction without residual deficits: Secondary | ICD-10-CM | POA: Diagnosis not present

## 2021-12-21 DIAGNOSIS — R42 Dizziness and giddiness: Secondary | ICD-10-CM | POA: Diagnosis not present

## 2021-12-21 NOTE — Progress Notes (Signed)
[facility] Clinic Note - Initial Visit   Date: 12/21/2021   Kerry [name] MRN: [id number] DOB: 03-Nov-1950   Dear Dr. Tamala Julian:  Thank you for your kind referral of Kerry [name] for consultation of abnormal MRI brain . Although her history is well known to you, please allow [place] to reiterate it for the purpose of our medical record. The patient was accompanied to the clinic by self.   Kerry [name] is a 71 y.o. right-handed female with hypertension, hyperlipidemia, and chronic pain presenting for evaluation of old stroke found on MRI brain.   IMPRESSION/PLAN: Remote right basal ganglia stroke found incidentally on MRI brain as part of work-up for vertigo and headaches.  Imaging was personally viewed and shows a chronic infarct, which most likely has been present for years and would not explain any of her current symptomology (facial pain, dizziness, double vision).  She does not recall having any history of sudden onset of lateralizing weakness/numbness on the left side, making findings consistent with silent, asymptomatic stroke.  She does have risk factors for cerebrovascular disease (hypertension, hyperlipidemia) and it appears that she is not keen on taking prescription medication.  I recommend that she start aspirin '81mg'$  and follow-up with PCP about blood pressure and cholesterol management. To be complete, I will order CTA head to assess intracranial vessels.  Right basal ganglia stroke is classic for small vessel disease and therefore, even more important to have risk factors controlled.   ------------------------------------------------------------- History of present illness: In September 20, she rolled over to get out of bed and developed vertigo.  When at rest, she was doing ok.  Vertigo lasted about two weeks and then developed double vision.  Symptoms waxed and waned between double vision and vertigo for a few weeks.  She  still has some double vision with lateral gaze to the right.  She has a multitude of symptoms including generalized weakness, chronic back and neck pain and headaches.  Part of her evaluation included getting MRI brain which shows chronic right basal ganglia infarct.  She is here to review these findings.  She denies left sided weakness or numbness/tingling. In 1990s, she had left Bell's palsy and recovered completely.  Out-side paper records, electronic medical record, and images have been reviewed where available and summarized as:  MRI brain wwo contrast 12/06/2021: 1. No recent insult or specific cause for symptoms. 2. Remote small vessel infarct at the right basal ganglia.  MRA neck 12/04/2021:  Negative motion degraded neck MRA. No flow limiting stenosis or ulceration.  Lab Results  Component Value Date   HGBA1C 5.9 07/05/2011   Lab Results  Component Value Date   FUXNATFT73 220 04/10/2019   Lab Results  Component Value Date   TSH 1.46 01/24/2017   Lab Results  Component Value Date   ESRSEDRATE 30 04/10/2019    Past Medical History:  Diagnosis Date   Abnormal uterine bleeding    Allergic rhinitis, cause unspecified    Arthritis of knee, right    Bursitis of left shoulder    Cherry hemangioma 07/28/15   vulva   Chronic pain    Dyspareunia    Elevated cholesterol    Elevated uric acid in blood 2021   Fibroid    Foot fracture, left 01/2015   and torn tendons   GERD (gastroesophageal reflux disease)    HTN (hypertension)    Kidney stones    Sleep apnea    Urinary incontinence  Past Surgical History:  Procedure Laterality Date    dilatation and curettage  2000   CARPAL TUNNEL RELEASE  1998   CARPAL TUNNEL RELEASE Left 05/21/2020   CATARACT EXTRACTION Bilateral    dental implants  2010   DENTAL SURGERY     DILATATION & CURETTAGE/HYSTEROSCOPY WITH MYOSURE N/A 08/22/2021   Procedure: DILATATION & CURETTAGE/HYSTEROSCOPY WITH MYOSURE RESECTION OF ENDOMETRIAL MASS;   Surgeon: Nunzio Cobbs, MD;  Location: [facility];  Service: Gynecology;  Laterality: N/A;   ESOPHAGEAL DILATION  2010   HAMMER TOE SURGERY     rt   OPERATIVE ULTRASOUND N/A 08/22/2021   Procedure: OPERATIVE ULTRASOUND GUIDED HYSTEROSCOPY;  Surgeon: Nunzio Cobbs, MD;  Location: [facility];  Service: Gynecology;  Laterality: N/A;   PELVIC LAPAROSCOPY  1990   ROTATOR CUFF REPAIR Right 09/2011   Dr. Tamera Punt   ROTATOR CUFF REPAIR Left 11/11/2013   Dr. Tamera Punt    SEPTOPLASTY     UMBILICAL HERNIA REPAIR  2010   Patient reports mesh was used for the repair.     Medications:  Outpatient Encounter Medications as of 12/21/2021  Medication Sig   ascorbic acid (VITAMIN C) 500 MG tablet Take 250 mg by mouth daily.   carboxymethylcellulose (REFRESH PLUS) 0.5 % SOLN 1 drop 2 (two) times daily as needed.   Cholecalciferol (VITAMIN D3) 5000 units CAPS Take 5,000 Units by mouth. Every other day   CINNAMON PO Take by mouth.   COLLAGEN PO Take by mouth. Collagen Peptides. 1 scoop   cyanocobalamin (VITAMIN B12) 1000 MCG/ML injection Inject 1,000 mcg into the muscle once. Once a month   Menatetrenone (VITAMIN K2) 100 MCG TABS Take 180 mcg by mouth. Every 6th day. MK-7   Misc Natural Product Nasal (NASAL CLEANSE RINSE MIX NA) Place into the nose. Xlear rinse   Misc Natural Products (TART CHERRY ADVANCED PO) Take 800 mg by mouth daily.   MISC NATURAL PRODUCTS PO Take by mouth. Black currant seed oil   Multiple Vitamins-Minerals (SUPER MULTI-VITAMIN PO) Take 1 capsule by mouth daily. Solgar Formula VM-75   NON FORMULARY Goat Kiefer   Omega-3 Fatty Acids (FISH OIL) 1000 MG CAPS Take by mouth.   Probiotic Product (PROBIOTIC ADVANCED PO) Take by mouth. Patient states taking 5 billion, one capsule daily. Suprema Dophilus probiotic   Sodium Chloride-Xylitol (XLEAR SINUS CARE SPRAY NA) Place into the nose. 2 sprays at bedtime   [DISCONTINUED]  Cyanocobalamin (VITAMIN B-12) 1000 MCG SUBL Place 1 tablet under the tongue 4 (four) times a week. (Patient not taking: Reported on 12/21/2021)   No facility-administered encounter medications on file as of 12/21/2021.    Allergies:  Allergies  Allergen Reactions   Meloxicam Other (See Comments)    Made patient have stomach pain ,sore throat, joint pains, face flush    Molds & Smuts Other (See Comments), Palpitations, Shortness Of Breath and Swelling   Cefixime Swelling   Celebrex [Celecoxib] Swelling   Cobalt Other (See Comments)    Per allergist   Gabapentin Other (See Comments)    Joint pain   Levofloxacin Other (See Comments)    Muscle and joint pain   Molybdenum Other (See Comments)    Per allergist   Other Other (See Comments)    TEQUIN. TEQUIN - JOINT AND MUSCLE PAIN Other Reaction: painful joints TEQUIN. TEQUIN - JOINT AND MUSCLE PAIN Tantal Metal   Penicillins Hives   Robaxin [Methocarbamol]    Tomato  Hives   Adhesive [Tape] Rash    Sensitive to EKG leads; sensitive to 51M Micropore tape   Dust Mite Extract Itching, Other (See Comments) and Palpitations   Tetracycline Hcl Rash    Can take Doxy   Tetracyclines & Related Rash    Family History: Family History  Problem Relation Age of Onset   Rheumatologic disease Mother    Diabetes Mother    Heart attack Father    Rheumatologic disease Sister    Arthritis Sister        --Rheumatoid arthritis   Ovarian cancer Sister 68   Crohn's disease Brother    Kidney failure Brother    Ulcerative colitis Brother    Glaucoma Brother     Social History: Social History   Tobacco Use   Smoking status: Never   Smokeless tobacco: Never  Vaping Use   Vaping Use: Never used  Substance Use Topics   Alcohol use: No    Alcohol/week: 0.0 standard drinks of alcohol   Drug use: No   Social History   Social History Narrative   Are you right handed or left handed? Right handed   Are you currently employed ? No retired    What is your current occupation?   Do you live at home alone? No   Who lives with you? Lives with husband.    What type of home do you live in: 1 story or 2 story? Two story home.        Vital Signs:  BP (!) 168/100   Pulse (!) 106   Ht '4\' 11"'$  (1.499 m)   Wt 223 lb (101.2 kg)   LMP 08/16/1992   SpO2 94%   BMI 45.04 kg/m     Neurological Exam: MENTAL STATUS including orientation to time, place, person, recent and remote memory, attention span and concentration, language, and fund of knowledge is normal.  Speech is not dysarthric.  CRANIAL NERVES: II:  No visual field defects.   III-IV-VI: Pupils equal round and reactive to light.  Normal conjugate, extra-ocular eye movements in all directions of gaze.  No nystagmus.  No ptosis.   V:  Normal facial sensation.    VII:  Normal facial symmetry and movements.   VIII:  Normal hearing and vestibular function.   IX-X:  Normal palatal movement.   XI:  Normal shoulder shrug and head rotation.   XII:  Normal tongue strength and range of motion, no deviation or fasciculation.  MOTOR:  Motor strength is 5/5 throughout.  No atrophy, fasciculations or abnormal movements.  No pronator drift.   MSRs:                                           Right        Left brachioradialis 2+  2+  biceps 2+  2+  triceps 2+  2+  patellar 2+  2+  ankle jerk 2+  2+  Hoffman no  no   SENSORY:  Normal and symmetric perception of light touch, pinprick, vibration, and temperature.    COORDINATION/GAIT: Normal finger-to- nose-finger.  Intact rapid alternating movements bilaterally.  Able to rise from a chair without using arms.  Gait narrow based and stable. Tandem and stressed gait intact. Gait is mildly wide-based, assisted with cane (right knee pain).    Total time spent reviewing records, interview, history/exam, documentation, and coordination of  care on day of encounter:  50 min    Thank you for allowing me to participate in patient's care.  If I can  answer any additional questions, I would be pleased to do so.    Sincerely,    Filippa Yarbough K. Posey Pronto, DO

## 2021-12-21 NOTE — Patient Instructions (Addendum)
Start aspirin '81mg'$  daily  CT angiogram of the head  It is important to keep your blood pressure and cholesterol under control to prevent future stroke.  Monitor blood pressure at home and discuss with your primary care doctor if you need to be on medication therapy for blood pressure and cholesterol management.

## 2021-12-27 ENCOUNTER — Ambulatory Visit (HOSPITAL_COMMUNITY)
Admission: RE | Admit: 2021-12-27 | Discharge: 2021-12-27 | Disposition: A | Discharge status: Home / Self Care | Payer: Medicare Other | Source: Ambulatory Visit | Attending: Internal Medicine | Admitting: Internal Medicine

## 2021-12-27 ENCOUNTER — Ambulatory Visit
Admission: RE | Admit: 2021-12-27 | Discharge: 2021-12-27 | Disposition: A | Discharge status: Home / Self Care | Payer: Medicare Other | Source: Ambulatory Visit | Attending: Neurology | Admitting: Neurology

## 2021-12-27 DIAGNOSIS — Z136 Encounter for screening for cardiovascular disorders: Secondary | ICD-10-CM | POA: Insufficient documentation

## 2021-12-27 DIAGNOSIS — I672 Cerebral atherosclerosis: Secondary | ICD-10-CM | POA: Diagnosis not present

## 2021-12-27 DIAGNOSIS — Z8673 Personal history of transient ischemic attack (TIA), and cerebral infarction without residual deficits: Secondary | ICD-10-CM

## 2021-12-27 DIAGNOSIS — R42 Dizziness and giddiness: Secondary | ICD-10-CM

## 2021-12-27 DIAGNOSIS — I6601 Occlusion and stenosis of right middle cerebral artery: Secondary | ICD-10-CM | POA: Diagnosis not present

## 2021-12-27 DIAGNOSIS — J341 Cyst and mucocele of nose and nasal sinus: Secondary | ICD-10-CM | POA: Diagnosis not present

## 2021-12-27 DIAGNOSIS — I639 Cerebral infarction, unspecified: Secondary | ICD-10-CM | POA: Diagnosis not present

## 2021-12-27 MED ORDER — IOPAMIDOL (ISOVUE-370) INJECTION 76%
75.0000 mL | Freq: Once | INTRAVENOUS | Status: AC | PRN
  Administered 2021-12-27: 75 mL via INTRAVENOUS

## 2021-12-28 ENCOUNTER — Other Ambulatory Visit (HOSPITAL_COMMUNITY): Payer: Medicare Other

## 2021-12-29 DIAGNOSIS — H903 Sensorineural hearing loss, bilateral: Secondary | ICD-10-CM | POA: Diagnosis not present

## 2021-12-29 DIAGNOSIS — H8110 Benign paroxysmal vertigo, unspecified ear: Secondary | ICD-10-CM | POA: Diagnosis not present

## 2022-01-02 DIAGNOSIS — Z961 Presence of intraocular lens: Secondary | ICD-10-CM | POA: Diagnosis not present

## 2022-01-02 DIAGNOSIS — H35073 Retinal telangiectasis, bilateral: Secondary | ICD-10-CM | POA: Diagnosis not present

## 2022-01-02 DIAGNOSIS — H532 Diplopia: Secondary | ICD-10-CM | POA: Diagnosis not present

## 2022-01-02 DIAGNOSIS — H43811 Vitreous degeneration, right eye: Secondary | ICD-10-CM | POA: Diagnosis not present

## 2022-01-02 DIAGNOSIS — H43391 Other vitreous opacities, right eye: Secondary | ICD-10-CM | POA: Diagnosis not present

## 2022-01-02 DIAGNOSIS — H35373 Puckering of macula, bilateral: Secondary | ICD-10-CM | POA: Diagnosis not present

## 2022-01-03 DIAGNOSIS — R9431 Abnormal electrocardiogram [ECG] [EKG]: Secondary | ICD-10-CM

## 2022-01-03 DIAGNOSIS — E785 Hyperlipidemia, unspecified: Secondary | ICD-10-CM

## 2022-01-03 DIAGNOSIS — R0609 Other forms of dyspnea: Secondary | ICD-10-CM

## 2022-01-03 DIAGNOSIS — Z136 Encounter for screening for cardiovascular disorders: Secondary | ICD-10-CM

## 2022-01-03 MED ORDER — ROSUVASTATIN CALCIUM 20 MG PO TABS: 20.0000 mg | ORAL_TABLET | Freq: Every day | ORAL | 3 refills | Status: DC

## 2022-01-03 MED ORDER — ASPIRIN 81 MG PO TBEC: 81.0000 mg | DELAYED_RELEASE_TABLET | Freq: Every day | ORAL | 3 refills | Status: AC

## 2022-01-03 NOTE — Telephone Encounter (Addendum)
Pt advised and verbalized understanding of her instructions... she will have labs 03/20/22.

## 2022-01-03 NOTE — Telephone Encounter (Signed)
Left a message for the pt to call back. My Chart message sent to her.

## 2022-01-03 NOTE — Telephone Encounter (Signed)
-----   Message from Nuala Alpha, LPN sent at 78/29/5621 10:07 AM EST -----  ----- Message ----- From: Fay Records, MD Sent: 01/02/2022   5:22 PM EST To: [facility] Triage  Calcium score done last week Pt hasa plaquing on coronary arteries   Score 1051.(95th percentile) 1  I would recomm a PET /CT to evaluate perfusion   She was not having symptomst  2  I would recomm an enteric coated aspirin 81 mg  3  Shold be on a statin    I would recommend Crestor 20 mg with lipomed and liver panel in 8 wks

## 2022-01-04 NOTE — Progress Notes (Signed)
Kerry [name] [place] Kerry [name] Phone: [phone] Subjective:   Kerry [name], am serving as a scribe for Dr. Hulan Saas.  I'm seeing this patient by the request  of:  Street, Kerry Mt, MD  CC: Multiple joint pain follow-up  [id number]  Kerry [name] is a 71 y.o. female coming in with complaint of back and neck pain. OMT 11/29/2021. Patient states that she has had multiple images since last visit. Saw ENT for double vision after a sinus issue. Saw another provider who told her that her visual changes were caused by inflammation. Patient has pending stress test. Also had high cardiac calcium score.   Would like name of shoes that were recommended in September.   Saw Research officer, trade union in [place] last week. Had adjustment to atlas and lower back. Patient did have increase in ROM for a short period of time ie. A few hours. Continues to have achiness in L side of cervical spine and arm. Also notes having fewer bladder spasms in past week which she feels might have come from adjustment.   Also saw derm for biopsy of spot on arm. Angiolipoma r/o atypia     Medications patient has been prescribed: None  Taking:         Reviewed prior external information including notes and imaging from previsou exam, outside providers and external EMR if available.   As well as notes that were available from care everywhere and other healthcare systems.  Reviewed patient's CT scan which was highly elevated for calcium scan and is not having any perfusion test of the heart on January 9  Past medical history, social, surgical and family history all reviewed in electronic medical record.  [name] pertanent information unless stated regarding to the chief complaint.   Past Medical History:  Diagnosis Date   Abnormal uterine bleeding    Allergic rhinitis, cause unspecified    Arthritis of knee, right    Bursitis of left shoulder     Cherry hemangioma 07/28/15   vulva   Chronic pain    Dyspareunia    Elevated cholesterol    Elevated uric acid in blood 2021   Fibroid    Foot fracture, left 01/2015   and torn tendons   GERD (gastroesophageal reflux disease)    HTN (hypertension)    Kidney stones    Sleep apnea    Urinary incontinence     Allergies  Allergen Reactions   Meloxicam Other (See Comments)    Made patient have stomach pain ,sore throat, joint pains, face flush    Molds & Smuts Other (See Comments), Palpitations, Shortness Of Breath and Swelling   Cefixime Swelling   Celebrex [Celecoxib] Swelling   Cobalt Other (See Comments)    Per allergist   Gabapentin Other (See Comments)    Joint pain   Levofloxacin Other (See Comments)    Muscle and joint pain   Molybdenum Other (See Comments)    Per allergist   Other Other (See Comments)    TEQUIN. TEQUIN - JOINT AND MUSCLE PAIN Other Reaction: painful joints TEQUIN. TEQUIN - JOINT AND MUSCLE PAIN Tantal Metal   Penicillins Hives   Robaxin [Methocarbamol]    Tomato Hives   Adhesive [Tape] Rash    Sensitive to EKG leads; sensitive to 61M Micropore tape   Dust Mite Extract Itching, Other (See Comments) and Palpitations   Tetracycline Hcl Rash    Can take Doxy   Tetracyclines &  Related Rash     Review of Systems:  [name] headache, visual changes, nausea, vomiting, diarrhea, constipation, dizziness, abdominal pain, skin rash, fevers, chills, night sweats, weight loss, swollen lymph nodes, body aches, joint swelling, chest pain, shortness of breath, mood changes. POSITIVE muscle aches  Objective  Blood pressure (!) 152/88, pulse 89, height '4\' 11"'$  (1.499 m), weight 223 lb (101.2 kg), last menstrual period 08/16/1992, SpO2 98 %.   General: [name] apparent distress alert and oriented x3 mood and affect normal, dressed appropriately.  HEENT: Pupils equal, extraocular movements intact  Respiratory: Patient's speak in full sentences and does not appear short of  breath  Cardiovascular: [name] lower extremity edema, non tender, [name] erythema  Back does have some loss of lordosis. Tenderness to palpation noted.  Arthritic changes noted as well.  Multiple joints.  Does have some antalgic gait noted as well.  Osteopathic findings  C2 flexed rotated and side bent right C6 flexed rotated and side bent left T3 extended rotated and side bent right inhaled rib T9 extended rotated and side bent left L3 flexed rotated and side bent right Sacrum right on right       Assessment and Plan:  Degenerative cervical disc Continues to have degenerative disc disease.  Patient is being worked up for her high calcium score on her CT recently, she will be undergoing a perfusion test.  We will see what that shows.  Because of this and patient having the findings noted with the dizziness and neck we did discuss the possible need for further intervention may be necessary.  Patient is not having chest pain but does have significant fatigue and could be contributing more than anything else.    Nonallopathic problems  Decision today to treat with OMT was based on Physical Exam  After verbal consent patient was treated with  ME, FPR techniques in cervical, rib, thoracic, lumbar, and sacral  areas  Patient tolerated the procedure well with improvement in symptoms  Patient given exercises, stretches and lifestyle modifications  See medications in patient instructions if given  Patient will follow up in 4-8 weeks    The above documentation has been reviewed and is accurate and complete [name], DO          Note: This dictation was prepared with Dragon dictation along with smaller phrase technology. Any transcriptional errors that result from this process are unintentional.

## 2022-01-05 DIAGNOSIS — H524 Presbyopia: Secondary | ICD-10-CM | POA: Diagnosis not present

## 2022-01-05 DIAGNOSIS — H35373 Puckering of macula, bilateral: Secondary | ICD-10-CM | POA: Diagnosis not present

## 2022-01-05 DIAGNOSIS — H43811 Vitreous degeneration, right eye: Secondary | ICD-10-CM | POA: Diagnosis not present

## 2022-01-05 DIAGNOSIS — Z961 Presence of intraocular lens: Secondary | ICD-10-CM | POA: Diagnosis not present

## 2022-01-11 ENCOUNTER — Ambulatory Visit (INDEPENDENT_AMBULATORY_CARE_PROVIDER_SITE_OTHER): Payer: Medicare Other | Admitting: Family Medicine

## 2022-01-11 VITALS — BP 152/88 | HR 89 | Ht 59.0 in | Wt 223.0 lb

## 2022-01-11 DIAGNOSIS — M9903 Segmental and somatic dysfunction of lumbar region: Secondary | ICD-10-CM

## 2022-01-11 DIAGNOSIS — R42 Dizziness and giddiness: Secondary | ICD-10-CM

## 2022-01-11 DIAGNOSIS — M9904 Segmental and somatic dysfunction of sacral region: Secondary | ICD-10-CM | POA: Diagnosis not present

## 2022-01-11 DIAGNOSIS — M9901 Segmental and somatic dysfunction of cervical region: Secondary | ICD-10-CM | POA: Diagnosis not present

## 2022-01-11 DIAGNOSIS — M5030 Other cervical disc degeneration, unspecified cervical region: Secondary | ICD-10-CM

## 2022-01-11 DIAGNOSIS — M9908 Segmental and somatic dysfunction of rib cage: Secondary | ICD-10-CM

## 2022-01-11 DIAGNOSIS — M9902 Segmental and somatic dysfunction of thoracic region: Secondary | ICD-10-CM

## 2022-01-11 NOTE — Assessment & Plan Note (Addendum)
Continues to have degenerative disc disease.  Patient is being worked up for her high calcium score on her CT recently, she will be undergoing a perfusion test.  We will see what that shows.  Because of this and patient having the findings noted with the dizziness and neck we did discuss the possible need for further intervention may be necessary.  Patient is not having chest pain but does have significant fatigue and could be contributing more than anything else.  Spent 32 minutes with patient discussing her medications with her husband the findings on her MRI of the brain showing atherosclerosis.  Discussed the simvastatin 20 mg and the baby aspirin 81 mg

## 2022-01-11 NOTE — Patient Instructions (Signed)
CoQ10 '200mg'$  with the statin See me in 5-6 weeks

## 2022-01-16 DIAGNOSIS — I2510 Atherosclerotic heart disease of native coronary artery without angina pectoris: Secondary | ICD-10-CM

## 2022-01-16 HISTORY — DX: Atherosclerotic heart disease of native coronary artery without angina pectoris: I25.10

## 2022-01-20 ENCOUNTER — Telehealth: Payer: Self-pay

## 2022-01-20 DIAGNOSIS — R0600 Dyspnea, unspecified: Secondary | ICD-10-CM

## 2022-01-20 NOTE — Telephone Encounter (Signed)
Dr Harrington Challenger to sign attestation.

## 2022-01-20 NOTE — Telephone Encounter (Signed)
-----   Message from Lorenza Evangelist, RN sent at 01/20/2022 11:37 AM EST ----- Regarding: please sign cardiac PET attestation for appt on tuesday 01/24/22 Please sign attestation for cardiac PET scheduled on Tuesday 01/24/22  Thank you, Marchia Bond

## 2022-01-23 ENCOUNTER — Telehealth (HOSPITAL_COMMUNITY): Payer: Self-pay | Admitting: Emergency Medicine

## 2022-01-23 NOTE — Telephone Encounter (Signed)
Attempted to call patient regarding upcoming cardiac PET appointment. Left message on voicemail with name and callback number Tashera Montalvo RN Navigator Cardiac Imaging [facility] [phone] Office [phone] Cell  

## 2022-01-23 NOTE — Telephone Encounter (Signed)
Reaching out to patient to offer assistance regarding upcoming cardiac imaging study; pt verbalizes understanding of appt date/time, parking situation and where to check in, pre-test NPO status and medications ordered, and verified current allergies; name and call back number provided for further questions should they arise Marchia Bond RN [facility] [phone] office [phone] cell  Arrival [address] [facility] main entrance Denies iv issues Daily meds No caffeine No food

## 2022-01-24 ENCOUNTER — Other Ambulatory Visit: Payer: Self-pay

## 2022-01-24 ENCOUNTER — Inpatient Hospital Stay (HOSPITAL_COMMUNITY)
Admission: EM | Admit: 2022-01-24 | Discharge: 2022-01-27 | DRG: 322 | Disposition: A | Discharge status: Home / Self Care | Payer: Medicare Other | Source: Ambulatory Visit | Attending: Cardiovascular Disease | Admitting: Cardiovascular Disease

## 2022-01-24 ENCOUNTER — Other Ambulatory Visit: Payer: Medicare Other

## 2022-01-24 ENCOUNTER — Ambulatory Visit (HOSPITAL_BASED_OUTPATIENT_CLINIC_OR_DEPARTMENT_OTHER)
Admission: RE | Admit: 2022-01-24 | Discharge: 2022-01-24 | Disposition: A | Discharge status: Home / Self Care | Payer: Medicare Other | Source: Ambulatory Visit | Attending: Internal Medicine | Admitting: Internal Medicine

## 2022-01-24 DIAGNOSIS — Z881 Allergy status to other antibiotic agents status: Secondary | ICD-10-CM

## 2022-01-24 DIAGNOSIS — R9439 Abnormal result of other cardiovascular function study: Secondary | ICD-10-CM | POA: Diagnosis not present

## 2022-01-24 DIAGNOSIS — R931 Abnormal findings on diagnostic imaging of heart and coronary circulation: Secondary | ICD-10-CM | POA: Diagnosis not present

## 2022-01-24 DIAGNOSIS — E669 Obesity, unspecified: Secondary | ICD-10-CM | POA: Diagnosis not present

## 2022-01-24 DIAGNOSIS — I25110 Atherosclerotic heart disease of native coronary artery with unstable angina pectoris: Secondary | ICD-10-CM | POA: Diagnosis not present

## 2022-01-24 DIAGNOSIS — Z8041 Family history of malignant neoplasm of ovary: Secondary | ICD-10-CM | POA: Diagnosis not present

## 2022-01-24 DIAGNOSIS — Z79899 Other long term (current) drug therapy: Secondary | ICD-10-CM

## 2022-01-24 DIAGNOSIS — R079 Chest pain, unspecified: Principal | ICD-10-CM

## 2022-01-24 DIAGNOSIS — G4733 Obstructive sleep apnea (adult) (pediatric): Secondary | ICD-10-CM | POA: Diagnosis present

## 2022-01-24 DIAGNOSIS — R9431 Abnormal electrocardiogram [ECG] [EKG]: Secondary | ICD-10-CM

## 2022-01-24 DIAGNOSIS — K219 Gastro-esophageal reflux disease without esophagitis: Secondary | ICD-10-CM | POA: Diagnosis present

## 2022-01-24 DIAGNOSIS — Z87442 Personal history of urinary calculi: Secondary | ICD-10-CM | POA: Diagnosis not present

## 2022-01-24 DIAGNOSIS — Z7982 Long term (current) use of aspirin: Secondary | ICD-10-CM

## 2022-01-24 DIAGNOSIS — I25700 Atherosclerosis of coronary artery bypass graft(s), unspecified, with unstable angina pectoris: Principal | ICD-10-CM | POA: Diagnosis present

## 2022-01-24 DIAGNOSIS — Z8249 Family history of ischemic heart disease and other diseases of the circulatory system: Secondary | ICD-10-CM

## 2022-01-24 DIAGNOSIS — Z83511 Family history of glaucoma: Secondary | ICD-10-CM | POA: Diagnosis not present

## 2022-01-24 DIAGNOSIS — Z833 Family history of diabetes mellitus: Secondary | ICD-10-CM

## 2022-01-24 DIAGNOSIS — Z880 Allergy status to penicillin: Secondary | ICD-10-CM

## 2022-01-24 DIAGNOSIS — R0609 Other forms of dyspnea: Secondary | ICD-10-CM

## 2022-01-24 DIAGNOSIS — I2510 Atherosclerotic heart disease of native coronary artery without angina pectoris: Secondary | ICD-10-CM | POA: Diagnosis not present

## 2022-01-24 DIAGNOSIS — E7800 Pure hypercholesterolemia, unspecified: Secondary | ICD-10-CM | POA: Diagnosis not present

## 2022-01-24 DIAGNOSIS — Z6841 Body Mass Index (BMI) 40.0 and over, adult: Secondary | ICD-10-CM

## 2022-01-24 DIAGNOSIS — Z8261 Family history of arthritis: Secondary | ICD-10-CM

## 2022-01-24 DIAGNOSIS — T50905A Adverse effect of unspecified drugs, medicaments and biological substances, initial encounter: Secondary | ICD-10-CM

## 2022-01-24 DIAGNOSIS — R0789 Other chest pain: Secondary | ICD-10-CM | POA: Diagnosis not present

## 2022-01-24 DIAGNOSIS — I10 Essential (primary) hypertension: Secondary | ICD-10-CM | POA: Diagnosis present

## 2022-01-24 DIAGNOSIS — Z955 Presence of coronary angioplasty implant and graft: Secondary | ICD-10-CM

## 2022-01-24 DIAGNOSIS — Z888 Allergy status to other drugs, medicaments and biological substances status: Secondary | ICD-10-CM | POA: Diagnosis not present

## 2022-01-24 DIAGNOSIS — Z136 Encounter for screening for cardiovascular disorders: Secondary | ICD-10-CM

## 2022-01-24 DIAGNOSIS — E785 Hyperlipidemia, unspecified: Secondary | ICD-10-CM

## 2022-01-24 DIAGNOSIS — Z91018 Allergy to other foods: Secondary | ICD-10-CM | POA: Diagnosis not present

## 2022-01-24 LAB — COMPREHENSIVE METABOLIC PANEL
ALT: 17 U/L (ref 0–44)
AST: 18 U/L (ref 15–41)
Albumin: 3.8 g/dL (ref 3.5–5.0)
Alkaline Phosphatase: 52 U/L (ref 38–126)
Anion gap: 9 (ref 5–15)
BUN: 17 mg/dL (ref 8–23)
CO2: 26 mmol/L (ref 22–32)
Calcium: 8.8 mg/dL — ABNORMAL LOW (ref 8.9–10.3)
Chloride: 106 mmol/L (ref 98–111)
Creatinine, Ser: 0.68 mg/dL (ref 0.44–1.00)
GFR, Estimated: >60 mL/min (ref >60)
Glucose, Bld: 110 mg/dL — ABNORMAL HIGH (ref 70–99)
Potassium: 3.9 mmol/L (ref 3.5–5.1)
Sodium: 141 mmol/L (ref 135–145)
Total Bilirubin: 0.4 mg/dL (ref 0.3–1.2)
Total Protein: 7 g/dL (ref 6.5–8.1)

## 2022-01-24 LAB — CBC WITH DIFFERENTIAL/PLATELET
Abs Immature Granulocytes: 0.05 10*3/uL (ref 0.00–0.07)
Basophils Absolute: 0 10*3/uL (ref 0.0–0.1)
Basophils Relative: 0 %
Eosinophils Absolute: 0.3 10*3/uL (ref 0.0–0.5)
Eosinophils Relative: 4 %
HCT: 42.9 % (ref 36.0–46.0)
Hemoglobin: 14.4 g/dL (ref 12.0–15.0)
Immature Granulocytes: 1 %
Lymphocytes Relative: 23 %
Lymphs Abs: 1.7 10*3/uL (ref 0.7–4.0)
MCH: 29.8 pg (ref 26.0–34.0)
MCHC: 33.6 g/dL (ref 30.0–36.0)
MCV: 88.8 fL (ref 80.0–100.0)
Monocytes Absolute: 0.7 10*3/uL (ref 0.1–1.0)
Monocytes Relative: 9 %
Neutro Abs: 4.6 10*3/uL (ref 1.7–7.7)
Neutrophils Relative %: 63 %
Platelets: 226 10*3/uL (ref 150–400)
RBC: 4.83 MIL/uL (ref 3.87–5.11)
RDW: 13.9 % (ref 11.5–15.5)
WBC: 7.3 10*3/uL (ref 4.0–10.5)
nRBC: 0 % (ref 0.0–0.2)

## 2022-01-24 LAB — NM PET CT CARDIAC PERFUSION MULTI W/ABSOLUTE BLOODFLOW
LV dias vol: 129 mL (ref 46–106)
LV sys vol: 65 mL
MBFR: 1.7
Nuc Rest EF: 63 %
Nuc Stress EF: 50 %
Rest MBF: 1.05 ml/g/min
Rest Nuclear Isotope Dose: 25.1 mCi
Stress MBF: 1.79 ml/g/min
Stress Nuclear Isotope Dose: 24.9 mCi
TID: 1.45

## 2022-01-24 LAB — BRAIN NATRIURETIC PEPTIDE: B Natriuretic Peptide: 137.3 pg/mL — ABNORMAL HIGH (ref 0.0–100.0)

## 2022-01-24 LAB — TROPONIN I (HIGH SENSITIVITY)
Troponin I (High Sensitivity): 17 ng/L (ref <18)
Troponin I (High Sensitivity): 20 ng/L — ABNORMAL HIGH (ref <18)

## 2022-01-24 MED ORDER — ONDANSETRON HCL 4 MG/2ML IJ SOLN
4.0000 mg | Freq: Four times a day (QID) | INTRAMUSCULAR | Status: DC | PRN
  Filled 2022-01-24: qty 2

## 2022-01-24 MED ORDER — REGADENOSON 0.4 MG/5ML IV SOLN
INTRAVENOUS | Status: AC
  Administered 2022-01-24: 0.4 mg via INTRAVENOUS
  Filled 2022-01-24: qty 5

## 2022-01-24 MED ORDER — ACETAMINOPHEN 325 MG PO TABS: 650.0000 mg | ORAL_TABLET | ORAL | Status: DC | PRN

## 2022-01-24 MED ORDER — RUBIDIUM RB82 GENERATOR (RUBYFILL)
25.0000 | PACK | Freq: Once | INTRAVENOUS | Status: AC
  Administered 2022-01-24: 25.1 via INTRAVENOUS

## 2022-01-24 MED ORDER — ASPIRIN 81 MG PO TBEC
81.0000 mg | DELAYED_RELEASE_TABLET | Freq: Every day | ORAL | Status: DC
  Administered 2022-01-24 – 2022-01-27 (×3): 81 mg via ORAL
  Filled 2022-01-24 (×3): qty 1

## 2022-01-24 MED ORDER — POLYVINYL ALCOHOL 1.4 % OP SOLN: 1.0000 [drp] | Freq: Two times a day (BID) | OPHTHALMIC | Status: DC | PRN

## 2022-01-24 MED ORDER — NITROGLYCERIN 0.4 MG SL SUBL: 0.4000 mg | SUBLINGUAL_TABLET | SUBLINGUAL | Status: DC | PRN

## 2022-01-24 MED ORDER — REGADENOSON 0.4 MG/5ML IV SOLN
0.4000 mg | Freq: Once | INTRAVENOUS | Status: AC
Start: 2022-01-24 — End: 2022-01-24

## 2022-01-24 MED ORDER — ROSUVASTATIN CALCIUM 20 MG PO TABS
20.0000 mg | ORAL_TABLET | Freq: Every day | ORAL | Status: DC
  Administered 2022-01-24 – 2022-01-27 (×4): 20 mg via ORAL
  Filled 2022-01-24 (×4): qty 1

## 2022-01-24 MED ORDER — RUBIDIUM RB82 GENERATOR (RUBYFILL)
25.0000 | PACK | Freq: Once | INTRAVENOUS | Status: AC
  Administered 2022-01-24: 24.9 via INTRAVENOUS

## 2022-01-24 NOTE — Progress Notes (Signed)
Discussed patient with ER staff, I had previously been contacted by Dr Gardiner Rhyme regarding this patient as he was reading the PET scan earlier today which showed concerns for severe multivessel disease. Given ongoing chest pain after study patient seen in [facility]. Plan for ER to ER transfer with admission to [facility] cardiology service with plans for cath tomorrow.    Carlyle Dolly MD

## 2022-01-24 NOTE — ED Notes (Signed)
Pt here via [facility] from [employer] pt was doing a stress test and EKG showed junctional rhythm, in NSR with [facility] during route here. Denies pain/ShOB.

## 2022-01-24 NOTE — ED Notes (Signed)
RN aware of pt's b/p

## 2022-01-24 NOTE — Progress Notes (Signed)
Dr. Gardiner Rhyme notified of pt EKG changes baseline Lead 2 -0.25 and lead V6 -0.45. New readings were reading ... Lead 2 -1.75 and V6 -0.45. EKG tech reports that pt EKG had not returned back to normal baseline post test. Pt denies chest pain and SOB but reports that she has some upper shoulder tightness and it feels as if her teeth ache. MD informed that if patient is not having chest pain she is free to go. Closely monitoring pt per MD orders at this time and pt is drinking Cola and tolerating it well.

## 2022-01-24 NOTE — ED Notes (Signed)
[facility] called to set up transport

## 2022-01-24 NOTE — Progress Notes (Signed)
Dr. Gardiner Rhyme called for pt increased BP. MD notified it pt systolic was less than 352 and asymptomatic we can proceed with the [place] administration. Pt denies chest pain, headaches, SOB and any other sx. Pt reports that the pain from haing her BP taken increased her BP. VS and orders assessed and will continue to monitor and tx pt according to MD orders.

## 2022-01-24 NOTE — Progress Notes (Signed)
Pt is siting up at this time and reports that she feels a lot better and EKG tech reports that pt EKG is back to baseline. Pt reports that the shoulder tightness has resolved and her lt shoulder is sore from possible having her heads above her head for a long period of time. Pt is a little light headed. Pt denies any other complaints. Pt ambulated to wheelchair with assist  and is resting in a treatment room at this time. Monitoring and treating pt per MD orders.

## 2022-01-24 NOTE — ED Triage Notes (Signed)
Pt c/o flush and tightness in chest after having scan today. Pt states issues have resolved.

## 2022-01-24 NOTE — Progress Notes (Signed)
Dr. Gardiner Rhyme called in reference to pt reporting that she is now having chest tightness radiating into both her shoulders and has a headache as well. Pt reports that she just feels unwell at the time. It was instructed to take her to the ED for further evaluation of her chest pain and increased BP.   Pt is alert, pleasant and talkative drinking water with husband at the chairside. Pt denies additional complaints.   ED Charge notified and was told to take pt to room 21.

## 2022-01-24 NOTE — ED Provider Notes (Signed)
[facility] DEPT Provider Note   CSN: [id number] Arrival date & time: 01/24/22  1627     History  Chief Complaint  Patient presents with   Chest Pain   Medication Reaction    Kerry [name] is a 72 y.o. female.  Patient is a 72 year old female with a history of GERD, hypertension, allergic rhinitis who is presenting today from the radiology suite after having a cardiac perfusion scan done due to feeling persistently unwell.  Patient reports that she was they are being evaluated because over the last 2 months she has had worsening shortness of breath.  She had a cardiac CT done on 12/27/2021 and at that time was in the 95th percentile with an elevated calcium score so this was the follow-up testing for this.  She reports when they gave her the stimulant for the scan she started having shortness of breath, flushing, palpitations and chest tightness.  They warned her that this was fairly common with the medication.  They told her that the candidate for this was drinking caffeine which she did after the test finished which initially she started feeling better but then her symptoms returned.  She reports now she has some slight tightness in the center of her chest and a mild headache but denies any shortness of breath that is beyond what she has been experiencing.  She has no nausea or vomiting.  She denies any abdominal pain.  She also reports when she got there her blood pressure was elevated and have remained elevated however when she goes to her doctor's office they do a manual blood pressure and it always looks pretty good between 373 and 428 systolic.  She does not take blood pressure medication regularly.  The history is provided by the patient and medical records.  Chest Pain      Home Medications Prior to Admission medications   Medication Sig Start Date End Date Taking? Authorizing Provider  ascorbic acid (VITAMIN C) 500 MG tablet Take 250 mg  by mouth daily.    [provider]  aspirin EC 81 MG tablet Take 1 tablet (81 mg total) by mouth daily. Swallow whole. 01/03/22   Fay Records, MD  carboxymethylcellulose (REFRESH PLUS) 0.5 % SOLN 1 drop 2 (two) times daily as needed.    [provider]  Cholecalciferol (VITAMIN D3) 5000 units CAPS Take 5,000 Units by mouth. Every other day    [provider]  CINNAMON PO Take by mouth.    [provider]  COLLAGEN PO Take by mouth. Collagen Peptides. 1 scoop    [provider]  cyanocobalamin (VITAMIN B12) 1000 MCG/ML injection Inject 1,000 mcg into the muscle once. Once a month    [provider]  Menatetrenone (VITAMIN K2) 100 MCG TABS Take 180 mcg by mouth. Every 6th day. MK-7    [provider]  Misc Natural Product Nasal (NASAL CLEANSE RINSE MIX NA) Place into the nose. Xlear rinse    [provider]  Misc Natural Products (TART CHERRY ADVANCED PO) Take 800 mg by mouth daily.    [provider]  MISC NATURAL PRODUCTS PO Take by mouth. Black currant seed oil    [provider]  Multiple Vitamins-Minerals (SUPER MULTI-VITAMIN PO) Take 1 capsule by mouth daily. Solgar Formula VM-75    [provider]  NON FORMULARY Garald Balding    [provider]  Omega-3 Fatty Acids (FISH OIL) 1000 MG CAPS Take by mouth.  [provider]  Probiotic Product (PROBIOTIC ADVANCED PO) Take by mouth. Patient states taking 5 billion, one capsule daily. Suprema Dophilus probiotic    [provider]  rosuvastatin (CRESTOR) 20 MG tablet Take 1 tablet (20 mg total) by mouth daily. 01/03/22   Fay Records, MD  Sodium Chloride-Xylitol Angus Seller SINUS CARE SPRAY NA) Place into the nose. 2 sprays at bedtime    [provider]      Allergies    Meloxicam, Molds & smuts, Cefixime, Celebrex [celecoxib], Cobalt, Gabapentin, Levofloxacin, Molybdenum, Other, Penicillins, Robaxin [methocarbamol],  Tomato, Adhesive [tape], Dust mite extract, Tetracycline hcl, and Tetracyclines & related    Review of Systems   Review of Systems  Cardiovascular:  Positive for chest pain.    Physical Exam Updated Vital Signs BP (!) 161/127 (BP Location: Right Arm)   Pulse 82   Temp 98 F (36.7 C) (Oral)   Resp 18   LMP 08/16/1992   SpO2 94%  Physical Exam Vitals and nursing note reviewed.  Constitutional:      General: She is not in acute distress.    Appearance: She is well-developed. She is not ill-appearing.  HENT:     Head: Normocephalic and atraumatic.  Eyes:     Pupils: Pupils are equal, round, and reactive to light.  Cardiovascular:     Rate and Rhythm: Normal rate and regular rhythm.     Pulses: Normal pulses.     Heart sounds: Murmur heard.     Systolic murmur is present with a grade of 2/6.     No friction rub.  Pulmonary:     Effort: Pulmonary effort is normal.     Breath sounds: Normal breath sounds. No wheezing or rales.  Abdominal:     General: Bowel sounds are normal. There is no distension.     Palpations: Abdomen is soft.     Tenderness: There is no abdominal tenderness. There is no guarding or rebound.  Musculoskeletal:        General: No tenderness. Normal range of motion.     Right lower leg: No edema.     Left lower leg: No edema.     Comments: No edema  Skin:    General: Skin is warm and dry.     Findings: No rash.  Neurological:     Mental Status: She is alert and oriented to person, place, and time. Mental status is at baseline.     Cranial Nerves: No cranial nerve deficit.  Psychiatric:        Mood and Affect: Mood normal.        Behavior: Behavior normal.     ED Results / Procedures / Treatments   Labs (all labs ordered are listed, but only abnormal results are displayed) Labs Reviewed  COMPREHENSIVE METABOLIC PANEL - Abnormal; Notable for the following components:      Result Value   Glucose, Bld 110 (*)    Calcium 8.8 (*)    All other  components within normal limits  BRAIN NATRIURETIC PEPTIDE - Abnormal; Notable for the following components:   B Natriuretic Peptide 137.3 (*)    All other components within normal limits  CBC WITH DIFFERENTIAL/PLATELET  TROPONIN I (HIGH SENSITIVITY)    EKG EKG Interpretation  Date/Time:  Tuesday January 24 2022 18:31:03 EST Ventricular Rate:  69 PR Interval:  155 QRS Duration: 90 QT Interval:  389 QTC Calculation: 417 R Axis:   -15 Text Interpretation: Sinus rhythm LVH with secondary  repolarization abnormality Anterior infarct, old , new T wave inversion Lateral leads since 2013 Confirmed by Blanchie Dessert [phone]) on 01/24/2022 6:36:30 PM  Radiology NM PET CT CARDIAC PERFUSION MULTI W/ABSOLUTE BLOODFLOW  Result Date: 01/24/2022 CLINICAL DATA:  This over-read does not include interpretation of cardiac or coronary anatomy or pathology. The cardiac PET-CT interpretation by the cardiologist is attached. COMPARISON:  None Available. FINDINGS: Vascular: Atherosclerotic calcifications involving the aorta and coronary arteries. No ascending thoracic aortic aneurysm. Mediastinum/Nodes: No mediastinal or hilar mass or adenopathy. Small scattered lymph nodes are noted. The esophagus is grossly normal. Lungs/Pleura: Mosaic pattern of ground-glass attenuation in the lungs new since prior cardiac CT. This can be seen with asymmetric pulmonary edema, small airways disease such as asthma or constrictive bronchiolitis. Other possibilities would include cryptogenic organizing pneumonia or hypersensitivity pneumonitis. No pulmonary lesions. No pleural effusions. Upper Abdomen: No significant upper abdominal findings. Moderate vascular calcifications. Musculoskeletal: No breast masses or bone lesions. IMPRESSION: 1. Mosaic pattern of ground-glass attenuation in the lungs new since prior cardiac CT. This can be seen with asymmetric pulmonary edema, small airways disease such as asthma or constrictive  bronchiolitis. Other possibilities would include cryptogenic organizing pneumonia or hypersensitivity pneumonitis. 2. No mediastinal or hilar mass or adenopathy. 3. Aortic and coronary artery calcifications. Electronically Signed   By: Marijo Sanes M.D.   On: 01/24/2022 16:37    Procedures Procedures    Medications Ordered in ED Medications - No data to display  ED Course/ Medical Decision Making/ A&P                           Medical Decision Making Amount and/or Complexity of Data Reviewed Labs: ordered.   Pt with multiple medical problems and comorbidities and presenting today with a complaint that caries a high risk for morbidity and mortality.  Here today with complaint of ongoing chest tightness, headache and feeling unwell after getting a medication during cardiac perfusion study.  Patient initially was better after caffeine but then symptoms returned and they did not feel comfortable letting her go home.  Patient is well-appearing here.  She is able to speak in full sentences.  No stridor or wheezing noted on exam.  Oxygen saturation 96% on room air with normal respirations and heart rate.  Patient is noted to be hypertensive here and reports her blood pressure is always elevated when she sees the doctor and they use an automatic cuff but is usually better with a manual cuff.  She has not take blood pressure medication.  She has been having ongoing issues with shortness of breath which is why she was getting the scan today.  She does not have significant evidence of fluid overload on exam or rales.  Suspect her initial symptoms are result of the dye/medication she received.  She is starting to feel better now and they told her to drink plenty of water.  Will ensure no evidence of CHF, electrolyte abnormality or kidney abnormality.  Will also do an EKG.  Will continue to monitor blood pressure.  However at this time lower suspicion for ACS.  Patient's perfusion scan did show that she had a  new mosaic pattern of the lungs which was not present about 1 month ago which could be atypical pulmonary edema versus asthma, constrictive bronchiolitis or hypersensitivity pneumonitis.  She does plan on following up with pulmonologist next week.  7:01 PM As patient was undergoing workup Dr. Nechama Guard called with cardiology  and he had just evaluated patient's cardiac perfusion test and reports there is some very concerning findings and he feels that the patient needs admission and a catheterization tomorrow.  Patient reports the pain is basically resolved at this time.  Discussed with her the above findings and concerns and she is amenable to what ever the recommendation is.  Will have patient transferred ED to ED so cardiology can see her and admit her for catheterization tomorrow.  Because she is no longer having significant pain do not feel that she needs to be started on heparin at this time.  I independently interpreted patient's labs and CBC without acute findings, CMP within normal limits.  BNP and troponin are reassuring.         Final Clinical Impression(s) / ED Diagnoses Final diagnoses:  Chest pain, unspecified type  Adverse effect of drug, initial encounter    Rx / DC Orders ED Discharge Orders     None         Blanchie Dessert, MD 01/24/22 1901

## 2022-01-24 NOTE — H&P (Addendum)
Cardiology Admission History and Physical   Patient ID: Kerry [name] MRN: [id number]; DOB: 1950/06/01   Admission date: 01/24/2022  PCP:  [facility], [name], [place] [facility] Cardiologist:  Dorris Carnes    Chief Complaint:  High risk stress test  Patient Profile:   Kerry [name] is a 72 y.o. female with hypertension, GERD, OSA who is being seen 01/24/2022 for the evaluation of high risk stress test.  History of Present Illness:   Ms. Bradner presented to [facility] today for an outpatient nuclear stress test due to dyspnea for the past few months and high calcium score. At the conclusion of the exam she had persistent dyspnea, palpitations, and chest tightness. Nuclear exam was interpreted as suggestive of severe multi-vessel CAD (full report below). She was transported to the [facility] ED for evaluation and treatment. There troponin was low and flat in 10's. ECG with lateral t wave inversions. Symptoms largely resolved in the [facility] ED but given her high risk perfusion scan, she was transferred to [facility] ED for cath in AM.  Was last seen by Dorris Carnes 03/21/21. She reported an episode of dyspnea and lightheadedness without syncope. Echo ordered, calcium score as LDL was 155 mg/dL. Echo was not completed. Calcium score was 95th percentile for age and she was prescribed ASA and statin.   Past Medical History:  Diagnosis Date   Abnormal uterine bleeding    Allergic rhinitis, cause unspecified    Arthritis of knee, right    Bursitis of left shoulder    Cherry hemangioma 07/28/15   vulva   Chronic pain    Dyspareunia    Elevated cholesterol    Elevated uric acid in blood 2021   Fibroid    Foot fracture, left 01/2015   and torn tendons   GERD (gastroesophageal reflux disease)    HTN (hypertension)    Kidney stones    Sleep apnea    Urinary incontinence     Past Surgical History:  Procedure Laterality Date    dilatation and curettage  2000    CARPAL TUNNEL RELEASE  1998   CARPAL TUNNEL RELEASE Left 05/21/2020   CATARACT EXTRACTION Bilateral    dental implants  2010   DENTAL SURGERY     DILATATION & CURETTAGE/HYSTEROSCOPY WITH MYOSURE N/A 08/22/2021   Procedure: DILATATION & CURETTAGE/HYSTEROSCOPY WITH MYOSURE RESECTION OF ENDOMETRIAL MASS;  Surgeon: Nunzio Cobbs, MD;  Location: [facility];  Service: Gynecology;  Laterality: N/A;   ESOPHAGEAL DILATION  2010   HAMMER TOE SURGERY     rt   OPERATIVE ULTRASOUND N/A 08/22/2021   Procedure: OPERATIVE ULTRASOUND GUIDED HYSTEROSCOPY;  Surgeon: Nunzio Cobbs, MD;  Location: [facility];  Service: Gynecology;  Laterality: N/A;   PELVIC LAPAROSCOPY  1990   ROTATOR CUFF REPAIR Right 09/2011   Dr. Tamera Punt   ROTATOR CUFF REPAIR Left 11/11/2013   Dr. Tamera Punt    SEPTOPLASTY     UMBILICAL HERNIA REPAIR  2010   Patient reports mesh was used for the repair.     Medications Prior to Admission: Prior to Admission medications   Medication Sig Start Date End Date Taking? Authorizing Provider  ascorbic acid (VITAMIN C) 500 MG tablet Take 250 mg by mouth daily.    [provider]  aspirin EC 81 MG tablet Take 1 tablet (81 mg total) by mouth daily. Swallow whole. 01/03/22   Fay Records, MD  carboxymethylcellulose (REFRESH PLUS) 0.5 % SOLN 1 drop 2 (two) times daily as needed.    [provider]  Cholecalciferol (VITAMIN D3) 5000 units CAPS Take 5,000 Units by mouth. Every other day    [provider]  CINNAMON PO Take by mouth.    [provider]  COLLAGEN PO Take by mouth. Collagen Peptides. 1 scoop    [provider]  cyanocobalamin (VITAMIN B12) 1000 MCG/ML injection Inject 1,000 mcg into the muscle once. Once a month    [provider]  Menatetrenone (VITAMIN K2) 100 MCG TABS Take 180 mcg by mouth. Every 6th day. MK-7    [provider]  Misc Natural Product Nasal (NASAL  CLEANSE RINSE MIX NA) Place into the nose. Xlear rinse    [provider]  Misc Natural Products (TART CHERRY ADVANCED PO) Take 800 mg by mouth daily.    [provider]  MISC NATURAL PRODUCTS PO Take by mouth. Black currant seed oil    [provider]  Multiple Vitamins-Minerals (SUPER MULTI-VITAMIN PO) Take 1 capsule by mouth daily. Solgar Formula VM-75    [provider]  NON FORMULARY Garald Balding    [provider]  Omega-3 Fatty Acids (FISH OIL) 1000 MG CAPS Take by mouth.    [provider]  Probiotic Product (PROBIOTIC ADVANCED PO) Take by mouth. Patient states taking 5 billion, one capsule daily. Suprema Dophilus probiotic    [provider]  rosuvastatin (CRESTOR) 20 MG tablet Take 1 tablet (20 mg total) by mouth daily. 01/03/22   Fay Records, MD  Sodium Chloride-Xylitol Angus Seller SINUS CARE SPRAY NA) Place into the nose. 2 sprays at bedtime    [provider]     Allergies:    Allergies  Allergen Reactions   Meloxicam Other (See Comments)    Made patient have stomach pain ,sore throat, joint pains, face flush    Molds & Smuts Other (See Comments), Palpitations, Shortness Of Breath and Swelling   Cefixime Swelling   Celebrex [Celecoxib] Swelling   Cobalt Other (See Comments)    Per allergist   Gabapentin Other (See Comments)    Joint pain   Levofloxacin Other (See Comments)    Muscle and joint pain   Molybdenum Other (See Comments)    Per allergist   Other Other (See Comments)    TEQUIN. TEQUIN - JOINT AND MUSCLE PAIN Other Reaction: painful joints TEQUIN. TEQUIN - JOINT AND MUSCLE PAIN Tantal Metal   Penicillins Hives   Robaxin [Methocarbamol]    Tomato Hives   Adhesive [Tape] Rash    Sensitive to EKG leads; sensitive to 99M Micropore tape   Dust Mite Extract Itching, Other (See Comments) and Palpitations   Tetracycline Hcl Rash    Can take Doxy   Tetracyclines & Related Rash    Social History:    Social History   Socioeconomic History   Marital status: Married    Spouse name: Not on file   Number of children: Not on file   Years of education: Not on file   Highest education level: Not on file  Occupational History   Occupation: Retired  Tobacco Use   Smoking status: Never   Smokeless tobacco: Never  Vaping Use   Vaping Use: Never used  Substance and Sexual Activity   Alcohol use: No    Alcohol/week: 0.0 standard drinks of alcohol   Drug use: No   Sexual activity: Not Currently    Partners: Male  Birth control/protection: Post-menopausal  Other Topics Concern   Not on file  Social History Narrative   Are you right handed or left handed? Right handed   Are you currently employed ? No retired   What is your current occupation?   Do you live at home alone? No   Who lives with you? Lives with husband.    What type of home do you live in: 1 story or 2 story? Two story home.       Social Determinants of Health   Financial Resource Strain: Not on file  Food Insecurity: Not on file  Transportation Needs: Not on file  Physical Activity: Not on file  Stress: Not on file  Social Connections: Not on file  Intimate Partner Violence: Not on file    Family History:   The patient's family history includes Arthritis in her sister; Crohn's disease in her brother; Diabetes in her mother; Glaucoma in her brother; Heart attack in her father; Kidney failure in her brother; Ovarian cancer (age of onset: 49) in her sister; Rheumatologic disease in her mother and sister; Ulcerative colitis in her brother.    ROS:  Please see the history of present illness.  All other ROS reviewed and negative.     Physical Exam/Data:   Vitals:   01/24/22 1633 01/24/22 1651 01/24/22 1830 01/24/22 2015  BP: (!) 186/97 (!) 189/97 (!) 161/127 (!) 171/85  Pulse: 91 75 82 83  Resp: '19 18 18 15  '$ Temp: 97.8 F (36.6 C) 98.2 F (36.8 C) 98 F (36.7 C) 98.3 F (36.8 C)  TempSrc: Oral  Oral Oral   SpO2: 95% 96% 94% 95%   No intake or output data in the 24 hours ending 01/24/22 2110    01/11/2022    2:44 PM 12/21/2021   10:06 AM 11/29/2021    1:28 PM  Last 3 Weights  Weight (lbs) 223 lb 223 lb 224 lb  Weight (kg) 101.152 kg 101.152 kg 101.606 kg     There is no height or weight on file to calculate BMI.  General:  Well nourished, well developed, in no acute distress HEENT: normal Neck: no JVD Vascular: No carotid bruits; Distal pulses 2+ bilaterally   Cardiac:  normal S1, S2; RRR; no murmur  Lungs:  clear to auscultation bilaterally, no wheezing, rhonchi or rales  Abd: soft, nontender, no hepatomegaly  Ext: no edema Musculoskeletal:  No deformities, BUE and BLE strength normal and equal Skin: warm and dry  Neuro:  CNs 2-12 intact, no focal abnormalities noted Psych:  Normal affect    EKG:  The ECG that was done and was personally reviewed and demonstrates NSR with AL t wave inversions  Relevant CV Studies: Nuclear stress 01/24/22   Findings suggest severe multivessel CAD.  While perfusion images only show inferolateral ischemia, there are multiple findings suggestive of severe multivessel CAD, including drop in EF with stress, marked TID (1.45), diffuse ST depressions following lexiscan, severe coronary calcifications on CT, and decreased flow reserve globally and in each coronary distribution.  Cardiac catherization recommended.   Diffuse horizontal ST depressions during recovery   LV perfusion is abnormal. There is evidence of ischemia. Defect 1: There is a small defect with mild reduction in uptake present in the mid to basal inferolateral location(s) that is reversible. There is normal wall motion in the defect area. Consistent with ischemia.   Rest left ventricular function is normal. Rest EF: 63 %. Stress left ventricular function is abnormal. Stress  global function is mildly reduced. Stress EF: 50 %. End diastolic cavity size is normal. End systolic cavity size is  normal.   Myocardial blood flow was computed to be 1.87m/g/min at rest and 1.787mg/min at stress. Global myocardial blood flow reserve was 1.70 and was abnormal.   Coronary calcium was present on the attenuation correction CT images. Severe coronary calcifications were present. Coronary calcifications were present in the left anterior descending artery, left circumflex artery and right coronary artery distribution(s).   Findings are consistent with ischemia. The study is high risk.   Electronically signed by ChOswaldo MilianMD  Laboratory Data:  High Sensitivity Troponin:   Recent Labs  Lab 01/24/22 1715 01/24/22 2014  TROPONINIHS 17 20*      Chemistry Recent Labs  Lab 01/24/22 1715  NA 141  K 3.9  CL 106  CO2 26  GLUCOSE 110*  BUN 17  CREATININE 0.68  CALCIUM 8.8*  GFRNONAA >60  ANIONGAP 9    Recent Labs  Lab 01/24/22 1715  PROT 7.0  ALBUMIN 3.8  AST 18  ALT 17  ALKPHOS 52  BILITOT 0.4   Lipids No results for input(s): "CHOL", "TRIG", "HDL", "LABVLDL", "LDLCALC", "CHOLHDL" in the last 168 hours. Hematology Recent Labs  Lab 01/24/22 1715  WBC 7.3  RBC 4.83  HGB 14.4  HCT 42.9  MCV 88.8  MCH 29.8  MCHC 33.6  RDW 13.9  PLT 226   Thyroid No results for input(s): "TSH", "FREET4" in the last 168 hours. BNP Recent Labs  Lab 01/24/22 1715  BNP 137.3*    DDimer No results for input(s): "DDIMER" in the last 168 hours.   Radiology/Studies:  NM PET CT CARDIAC PERFUSION MULTI W/ABSOLUTE BLOODFLOW  Result Date: 01/24/2022   Findings suggest severe multivessel CAD.  While perfusion images only show inferolateral ischemia, there are multiple findings suggestive of severe multivessel CAD, including drop in EF with stress, marked TID (1.45), diffuse ST depressions following lexiscan, severe coronary calcifications on CT, and decreased flow reserve globally and in each coronary distribution.  Cardiac catherization recommended.   Diffuse horizontal ST  depressions during recovery   LV perfusion is abnormal. There is evidence of ischemia. Defect 1: There is a small defect with mild reduction in uptake present in the mid to basal inferolateral location(s) that is reversible. There is normal wall motion in the defect area. Consistent with ischemia.   Rest left ventricular function is normal. Rest EF: 63 %. Stress left ventricular function is abnormal. Stress global function is mildly reduced. Stress EF: 50 %. End diastolic cavity size is normal. End systolic cavity size is normal.   Myocardial blood flow was computed to be 1.0549m/min at rest and 1.6m6mmin at stress. Global myocardial blood flow reserve was 1.70 and was abnormal.   Coronary calcium was present on the attenuation correction CT images. Severe coronary calcifications were present. Coronary calcifications were present in the left anterior descending artery, left circumflex artery and right coronary artery distribution(s).   Findings are consistent with ischemia. The study is high risk.   Electronically signed by ChriOswaldo Milian CLINICAL DATA:  This over-read does not include interpretation of cardiac or coronary anatomy or pathology. The cardiac PET-CT interpretation by the cardiologist is attached. COMPARISON:  None Available. FINDINGS: Vascular: Atherosclerotic calcifications involving the aorta and coronary arteries. No ascending thoracic aortic aneurysm. Mediastinum/Nodes: No mediastinal or hilar mass or adenopathy. Small scattered lymph nodes are noted. The esophagus is grossly normal. Lungs/Pleura: Mosaic pattern of ground-glass  attenuation in the lungs new since prior cardiac CT. This can be seen with asymmetric pulmonary edema, small airways disease such as asthma or constrictive bronchiolitis. Other possibilities would include cryptogenic organizing pneumonia or hypersensitivity pneumonitis. No pulmonary lesions. No pleural effusions. Upper Abdomen: No significant upper abdominal  findings. Moderate vascular calcifications. Musculoskeletal: No breast masses or bone lesions. IMPRESSION: 1. Mosaic pattern of ground-glass attenuation in the lungs new since prior cardiac CT. This can be seen with asymmetric pulmonary edema, small airways disease such as asthma or constrictive bronchiolitis. Other possibilities would include cryptogenic organizing pneumonia or hypersensitivity pneumonitis. 2. No mediastinal or hilar mass or adenopathy. 3. Aortic and coronary artery calcifications. Electronically Signed   By: Marijo Sanes M.D.   On: 01/24/2022 16:37    Assessment and Plan:   Multi-vessel CAD - nuclear exam with TID, drop in EF with stress, poor perfusion reserve. No active chest pain, no rise or fall in troponin, inconsistent with ACS therefore will not treat with heparin. - NPO for cath in AM - high intensity statin - ASA daily  HTN GERD OSA   Risk Assessment/Risk Scores:       Severity of Illness: The appropriate patient status for this patient is OBSERVATION. Observation status is judged to be reasonable and necessary in order to provide the required intensity of service to ensure the patient's safety. The patient's presenting symptoms, physical exam findings, and initial radiographic and laboratory data in the context of their medical condition is felt to place them at decreased risk for further clinical deterioration. Furthermore, it is anticipated that the patient will be medically stable for discharge from the hospital within 2 midnights of admission.    For questions or updates, please contact [facility] Please consult [email or web address] for contact info under     Signed, Loren Racer, MD  01/24/2022 9:10 PM

## 2022-01-25 ENCOUNTER — Inpatient Hospital Stay (HOSPITAL_COMMUNITY)
Admission: EM | Disposition: A | Discharge status: Home / Self Care | Payer: Self-pay | Source: Home / Self Care | Attending: Internal Medicine

## 2022-01-25 DIAGNOSIS — I10 Essential (primary) hypertension: Secondary | ICD-10-CM | POA: Diagnosis not present

## 2022-01-25 DIAGNOSIS — Z7982 Long term (current) use of aspirin: Secondary | ICD-10-CM | POA: Diagnosis not present

## 2022-01-25 DIAGNOSIS — Z87442 Personal history of urinary calculi: Secondary | ICD-10-CM | POA: Diagnosis not present

## 2022-01-25 DIAGNOSIS — R9439 Abnormal result of other cardiovascular function study: Secondary | ICD-10-CM

## 2022-01-25 DIAGNOSIS — I2510 Atherosclerotic heart disease of native coronary artery without angina pectoris: Secondary | ICD-10-CM

## 2022-01-25 DIAGNOSIS — E7800 Pure hypercholesterolemia, unspecified: Secondary | ICD-10-CM | POA: Diagnosis not present

## 2022-01-25 DIAGNOSIS — Z881 Allergy status to other antibiotic agents status: Secondary | ICD-10-CM | POA: Diagnosis not present

## 2022-01-25 DIAGNOSIS — Z6841 Body Mass Index (BMI) 40.0 and over, adult: Secondary | ICD-10-CM | POA: Diagnosis not present

## 2022-01-25 DIAGNOSIS — E669 Obesity, unspecified: Secondary | ICD-10-CM | POA: Diagnosis not present

## 2022-01-25 DIAGNOSIS — E785 Hyperlipidemia, unspecified: Secondary | ICD-10-CM | POA: Diagnosis not present

## 2022-01-25 DIAGNOSIS — I25700 Atherosclerosis of coronary artery bypass graft(s), unspecified, with unstable angina pectoris: Secondary | ICD-10-CM | POA: Diagnosis not present

## 2022-01-25 DIAGNOSIS — R931 Abnormal findings on diagnostic imaging of heart and coronary circulation: Secondary | ICD-10-CM

## 2022-01-25 DIAGNOSIS — G4733 Obstructive sleep apnea (adult) (pediatric): Secondary | ICD-10-CM | POA: Diagnosis not present

## 2022-01-25 DIAGNOSIS — K219 Gastro-esophageal reflux disease without esophagitis: Secondary | ICD-10-CM | POA: Diagnosis not present

## 2022-01-25 DIAGNOSIS — Z79899 Other long term (current) drug therapy: Secondary | ICD-10-CM | POA: Diagnosis not present

## 2022-01-25 HISTORY — PX: LEFT HEART CATH AND CORONARY ANGIOGRAPHY: CATH118249

## 2022-01-25 LAB — CBC
HCT: 44.4 % (ref 36.0–46.0)
Hemoglobin: 14.4 g/dL (ref 12.0–15.0)
MCH: 29.1 pg (ref 26.0–34.0)
MCHC: 32.4 g/dL (ref 30.0–36.0)
MCV: 89.9 fL (ref 80.0–100.0)
Platelets: 221 10*3/uL (ref 150–400)
RBC: 4.94 MIL/uL (ref 3.87–5.11)
RDW: 13.9 % (ref 11.5–15.5)
WBC: 10.7 10*3/uL — ABNORMAL HIGH (ref 4.0–10.5)
nRBC: 0 % (ref 0.0–0.2)

## 2022-01-25 LAB — BASIC METABOLIC PANEL
Anion gap: 8 (ref 5–15)
BUN: 17 mg/dL (ref 8–23)
CO2: 24 mmol/L (ref 22–32)
Calcium: 8.3 mg/dL — ABNORMAL LOW (ref 8.9–10.3)
Chloride: 108 mmol/L (ref 98–111)
Creatinine, Ser: 0.74 mg/dL (ref 0.44–1.00)
GFR, Estimated: >60 mL/min (ref >60)
Glucose, Bld: 98 mg/dL (ref 70–99)
Potassium: 3.8 mmol/L (ref 3.5–5.1)
Sodium: 140 mmol/L (ref 135–145)

## 2022-01-25 SURGERY — LEFT HEART CATH AND CORONARY ANGIOGRAPHY
Anesthesia: LOCAL

## 2022-01-25 MED ORDER — LIDOCAINE HCL (PF) 1 % IJ SOLN
INTRAMUSCULAR | Status: DC | PRN
  Administered 2022-01-25: 8 mL
  Administered 2022-01-25 (×2): 5 mL
  Administered 2022-01-25: 2 mL

## 2022-01-25 MED ORDER — LIDOCAINE HCL (PF) 1 % IJ SOLN
INTRAMUSCULAR | Status: AC
  Filled 2022-01-25: qty 30

## 2022-01-25 MED ORDER — HYDRALAZINE HCL 20 MG/ML IJ SOLN
INTRAMUSCULAR | Status: DC | PRN
  Administered 2022-01-25 (×2): 10 mg via INTRAVENOUS

## 2022-01-25 MED ORDER — FENTANYL CITRATE (PF) 100 MCG/2ML IJ SOLN
INTRAMUSCULAR | Status: DC | PRN
  Administered 2022-01-25: 50 ug
  Administered 2022-01-25: 25 ug via INTRAVENOUS

## 2022-01-25 MED ORDER — NITROGLYCERIN IN D5W 200-5 MCG/ML-% IV SOLN
5.0000 ug/min | INTRAVENOUS | Status: DC
  Administered 2022-01-25: 40 ug/min via INTRAVENOUS

## 2022-01-25 MED ORDER — SODIUM CHLORIDE 0.9% FLUSH
3.0000 mL | Freq: Two times a day (BID) | INTRAVENOUS | Status: DC
  Administered 2022-01-26: 3 mL via INTRAVENOUS

## 2022-01-25 MED ORDER — FENTANYL CITRATE (PF) 100 MCG/2ML IJ SOLN
INTRAMUSCULAR | Status: AC
  Filled 2022-01-25: qty 2

## 2022-01-25 MED ORDER — ACETAMINOPHEN 325 MG PO TABS: 650.0000 mg | ORAL_TABLET | ORAL | Status: DC | PRN

## 2022-01-25 MED ORDER — SODIUM CHLORIDE 0.9% FLUSH: 3.0000 mL | INTRAVENOUS | Status: DC | PRN

## 2022-01-25 MED ORDER — MIDAZOLAM HCL 2 MG/2ML IJ SOLN
INTRAMUSCULAR | Status: AC
  Filled 2022-01-25: qty 2

## 2022-01-25 MED ORDER — LABETALOL HCL 5 MG/ML IV SOLN: 10.0000 mg | INTRAVENOUS | Status: AC | PRN

## 2022-01-25 MED ORDER — DIAZEPAM 5 MG PO TABS
5.0000 mg | ORAL_TABLET | Freq: Four times a day (QID) | ORAL | Status: DC | PRN
  Administered 2022-01-25: 5 mg via ORAL
  Filled 2022-01-25: qty 1

## 2022-01-25 MED ORDER — NITROGLYCERIN IN D5W 200-5 MCG/ML-% IV SOLN
0.0000 ug/min | INTRAVENOUS | Status: DC
  Filled 2022-01-25: qty 250

## 2022-01-25 MED ORDER — HYDRALAZINE HCL 20 MG/ML IJ SOLN
INTRAMUSCULAR | Status: AC
  Filled 2022-01-25: qty 1

## 2022-01-25 MED ORDER — CARVEDILOL 6.25 MG PO TABS
6.2500 mg | ORAL_TABLET | Freq: Two times a day (BID) | ORAL | Status: DC
  Administered 2022-01-25 – 2022-01-26 (×4): 6.25 mg via ORAL
  Filled 2022-01-25 (×3): qty 1
  Filled 2022-01-25: qty 2

## 2022-01-25 MED ORDER — HYDRALAZINE HCL 20 MG/ML IJ SOLN: 10.0000 mg | INTRAMUSCULAR | Status: AC | PRN

## 2022-01-25 MED ORDER — SODIUM CHLORIDE 0.9 % WEIGHT BASED INFUSION
1.0000 mL/kg/h | INTRAVENOUS | Status: DC
  Administered 2022-01-25: 1 mL/kg/h via INTRAVENOUS

## 2022-01-25 MED ORDER — HEPARIN SODIUM (PORCINE) 1000 UNIT/ML IJ SOLN
INTRAMUSCULAR | Status: AC
  Filled 2022-01-25: qty 10

## 2022-01-25 MED ORDER — ASPIRIN 81 MG PO CHEW
81.0000 mg | CHEWABLE_TABLET | ORAL | Status: AC
  Administered 2022-01-25: 81 mg via ORAL
  Filled 2022-01-25: qty 1

## 2022-01-25 MED ORDER — SODIUM CHLORIDE 0.9 % IV SOLN: 250.0000 mL | INTRAVENOUS | Status: DC | PRN

## 2022-01-25 MED ORDER — ONDANSETRON HCL 4 MG/2ML IJ SOLN: 4.0000 mg | Freq: Four times a day (QID) | INTRAMUSCULAR | Status: DC | PRN

## 2022-01-25 MED ORDER — ENOXAPARIN SODIUM 40 MG/0.4ML IJ SOSY
40.0000 mg | PREFILLED_SYRINGE | INTRAMUSCULAR | Status: DC
  Administered 2022-01-26: 40 mg via SUBCUTANEOUS
  Filled 2022-01-25: qty 0.4

## 2022-01-25 MED ORDER — MIDAZOLAM HCL 2 MG/2ML IJ SOLN
INTRAMUSCULAR | Status: DC | PRN
  Administered 2022-01-25: 1 mg via INTRAVENOUS

## 2022-01-25 MED ORDER — IOHEXOL 350 MG/ML SOLN
INTRAVENOUS | Status: DC | PRN
  Administered 2022-01-25: 70 mL

## 2022-01-25 MED ORDER — VERAPAMIL HCL 2.5 MG/ML IV SOLN
INTRAVENOUS | Status: AC
  Filled 2022-01-25: qty 2

## 2022-01-25 MED ORDER — HEPARIN (PORCINE) IN NACL 1000-0.9 UT/500ML-% IV SOLN
INTRAVENOUS | Status: DC | PRN
  Administered 2022-01-25 (×2): 500 mL

## 2022-01-25 MED ORDER — SODIUM CHLORIDE 0.9% FLUSH: 3.0000 mL | Freq: Two times a day (BID) | INTRAVENOUS | Status: DC

## 2022-01-25 MED ORDER — SODIUM CHLORIDE 0.9 % IV SOLN: INTRAVENOUS | Status: AC

## 2022-01-25 MED ORDER — SODIUM CHLORIDE 0.9 % WEIGHT BASED INFUSION
3.0000 mL/kg/h | INTRAVENOUS | Status: DC
  Administered 2022-01-25: 3 mL/kg/h via INTRAVENOUS

## 2022-01-25 MED ORDER — NITROGLYCERIN IN D5W 200-5 MCG/ML-% IV SOLN
INTRAVENOUS | Status: AC
  Filled 2022-01-25: qty 250

## 2022-01-25 MED ORDER — HEPARIN (PORCINE) IN NACL 1000-0.9 UT/500ML-% IV SOLN
INTRAVENOUS | Status: AC
  Filled 2022-01-25: qty 1000

## 2022-01-25 MED ORDER — ASPIRIN 81 MG PO CHEW: 81.0000 mg | CHEWABLE_TABLET | Freq: Every day | ORAL | Status: DC

## 2022-01-25 SURGICAL SUPPLY — 14 items
BAG SNAP BAND KOVER 36X36 (MISCELLANEOUS) IMPLANT
CATH INFINITI 5FR MULTPACK ANG (CATHETERS) IMPLANT
DEVICE RAD COMP TR BAND LRG (VASCULAR PRODUCTS) IMPLANT
GLIDESHEATH SLEND SS 6F .021 (SHEATH) IMPLANT
GUIDEWIRE INQWIRE 1.5J.035X260 (WIRE) IMPLANT
INQWIRE 1.5J .035X260CM (WIRE)
KIT HEART LEFT (KITS) ×1 IMPLANT
KIT MICROPUNCTURE NIT STIFF (SHEATH) IMPLANT
PACK CARDIAC CATHETERIZATION (CUSTOM PROCEDURE TRAY) ×1 IMPLANT
SHEATH PINNACLE 5F 10CM (SHEATH) IMPLANT
SHEATH PROBE COVER 6X72 (BAG) IMPLANT
TRANSDUCER W/STOPCOCK (MISCELLANEOUS) ×1 IMPLANT
TUBING CIL FLEX 10 FLL-RA (TUBING) ×1 IMPLANT
WIRE MICROINTRODUCER 60CM (WIRE) IMPLANT

## 2022-01-25 NOTE — Progress Notes (Signed)
Hand off to [place] @ [phone]

## 2022-01-25 NOTE — Interval H&P Note (Signed)
Cath Lab Visit (complete for each Cath Lab visit)  Clinical Evaluation Leading to the Procedure:   ACS: No.  Non-ACS:    Anginal Classification: CCS III  Anti-ischemic medical therapy: No Therapy  Non-Invasive Test Results: High-risk stress test findings: cardiac mortality >3%/year  Prior CABG: No previous CABG      History and Physical Interval Note:  01/25/2022 4:05 PM  Kerry [name]  has presented today for surgery, with the diagnosis of angina.  The various methods of treatment have been discussed with the patient and family. After consideration of risks, benefits and other options for treatment, the patient has consented to  Procedure(s): LEFT HEART CATH AND CORONARY ANGIOGRAPHY (N/A) as a surgical intervention.  The patient's history has been reviewed, patient examined, no change in status, stable for surgery.  I have reviewed the patient's chart and labs.  Questions were answered to the patient's satisfaction.     Shelva Majestic

## 2022-01-25 NOTE — ED Notes (Signed)
Pt placed on 2 L [place]- desats when sleeping. Uses CPAP at home but did not bring. RT contacted and system is out of them.

## 2022-01-25 NOTE — ED Notes (Signed)
ED TO INPATIENT HANDOFF REPORT  ED Nurse Name and Phone #: Darnelle Maffucci [phone]  S Name/Age/Gender Kerry [name] 72 y.o. female Room/Bed: 042C/042C  Code Status   Code Status: Full Code  Home/SNF/Other Home Patient oriented to: self, place, time, and situation Is this baseline? Yes   Triage Complete: Triage complete  Chief Complaint CAD, multiple vessel [I25.10]  Triage Note Pt c/o flush and tightness in chest after having scan today. Pt states issues have resolved.   Allergies Allergies  Allergen Reactions   Meloxicam Other (See Comments)    Made patient have stomach pain ,sore throat, joint pains, face flush    Molds & Smuts Other (See Comments), Palpitations, Shortness Of Breath and Swelling   Cefixime Swelling   Celebrex [Celecoxib] Swelling   Cobalt Other (See Comments)    Per allergist   Gabapentin Other (See Comments)    Joint pain   Levofloxacin Other (See Comments)    Muscle and joint pain   Molybdenum Other (See Comments)    Per allergist   Other Other (See Comments)    TEQUIN. TEQUIN - JOINT AND MUSCLE PAIN Other Reaction: painful joints TEQUIN. TEQUIN - JOINT AND MUSCLE PAIN Tantal Metal   Penicillins Hives   Robaxin [Methocarbamol]    Tomato Hives   Adhesive [Tape] Rash    Sensitive to EKG leads; sensitive to 84M Micropore tape   Dust Mite Extract Itching, Other (See Comments) and Palpitations   Tetracycline Hcl Rash    Can take Doxy   Tetracyclines & Related Rash    Level of Care/Admitting Diagnosis ED Disposition     ED Disposition  Admit   Condition  --   Comment  Hospital Area: [facility] [[id number]]  Level of Care: Telemetry Cardiac [103]  May place patient in observation at [facility] or [place] if equivalent level of care is available:: No  Covid Evaluation: Asymptomatic - no recent exposure (last 10 days) testing not required  Diagnosis: CAD, multiple vessel [[id number]]  Admitting Physician: Loren Racer  [[id number]]  Attending Physician: Loren Racer [[id number]]          B Medical/Surgery History Past Medical History:  Diagnosis Date   Abnormal uterine bleeding    Allergic rhinitis, cause unspecified    Arthritis of knee, right    Bursitis of left shoulder    Cherry hemangioma 07/28/15   vulva   Chronic pain    Dyspareunia    Elevated cholesterol    Elevated uric acid in blood 2021   Fibroid    Foot fracture, left 01/2015   and torn tendons   GERD (gastroesophageal reflux disease)    HTN (hypertension)    Kidney stones    Sleep apnea    Urinary incontinence    Past Surgical History:  Procedure Laterality Date    dilatation and curettage  2000   CARPAL [place] Left 05/21/2020   CATARACT EXTRACTION Bilateral    dental implants  2010   DENTAL SURGERY     DILATATION & CURETTAGE/HYSTEROSCOPY WITH MYOSURE N/A 08/22/2021   Procedure: DILATATION & CURETTAGE/HYSTEROSCOPY WITH MYOSURE RESECTION OF ENDOMETRIAL MASS;  Surgeon: Nunzio Cobbs, MD;  Location: [facility];  Service: Gynecology;  Laterality: N/A;   ESOPHAGEAL DILATION  2010   HAMMER TOE SURGERY     rt   OPERATIVE ULTRASOUND N/A 08/22/2021   Procedure: OPERATIVE ULTRASOUND GUIDED HYSTEROSCOPY;  Surgeon: Karen Chafe  Lenard Galloway, MD;  Location: [facility];  Service: Gynecology;  Laterality: N/A;   PELVIC LAPAROSCOPY  1990   ROTATOR CUFF REPAIR Right 09/2011   Dr. Tamera Punt   ROTATOR CUFF REPAIR Left 11/11/2013   Dr. Tamera Punt    SEPTOPLASTY     UMBILICAL HERNIA REPAIR  2010   Patient reports mesh was used for the repair.     A IV Location/Drains/Wounds Patient Lines/Drains/Airways Status     Active Line/Drains/Airways     Name Placement date Placement time Site Days   Peripheral IV 01/24/22 Left Antecubital 01/24/22  1653  Antecubital  1   Incision 10/09/11 Shoulder Right 10/09/11  1502  -- 3761   Incision (Closed) 08/22/21 Vagina  Other (Comment) 08/22/21  0838  -- 156   Incision (Closed) 08/22/21 Other (Comment) Other (Comment) 08/22/21  0838  -- 156            Intake/Output Last 24 hours No intake or output data in the 24 hours ending 01/25/22 1153  Labs/Imaging Results for orders placed or performed during the hospital encounter of 01/24/22 (from the past 48 hour(s))  CBC with Differential/Platelet     Status: None   Collection Time: 01/24/22  5:15 PM  Result Value Ref Range   WBC 7.3 4.0 - 10.5 K/uL   RBC 4.83 3.87 - 5.11 MIL/uL   Hemoglobin 14.4 12.0 - 15.0 g/dL   HCT 42.9 36.0 - 46.0 %   MCV 88.8 80.0 - 100.0 fL   MCH 29.8 26.0 - 34.0 pg   MCHC 33.6 30.0 - 36.0 g/dL   RDW 13.9 11.5 - 15.5 %   Platelets 226 150 - 400 K/uL   nRBC 0.0 0.0 - 0.2 %   Neutrophils Relative % 63 %   Neutro Abs 4.6 1.7 - 7.7 K/uL   Lymphocytes Relative 23 %   Lymphs Abs 1.7 0.7 - 4.0 K/uL   Monocytes Relative 9 %   Monocytes Absolute 0.7 0.1 - 1.0 K/uL   Eosinophils Relative 4 %   Eosinophils Absolute 0.3 0.0 - 0.5 K/uL   Basophils Relative 0 %   Basophils Absolute 0.0 0.0 - 0.1 K/uL   Immature Granulocytes 1 %   Abs Immature Granulocytes 0.05 0.00 - 0.07 K/uL    Comment: Performed at [facility], [place] [address] [place], [place] [address]  Comprehensive metabolic panel     Status: Abnormal   Collection Time: 01/24/22  5:15 PM  Result Value Ref Range   Sodium 141 135 - 145 mmol/L   Potassium 3.9 3.5 - 5.1 mmol/L   Chloride 106 98 - 111 mmol/L   CO2 26 22 - 32 mmol/L   Glucose, Bld 110 (H) 70 - 99 mg/dL    Comment: Glucose reference range applies only to samples taken after fasting for at least 8 hours.   BUN 17 8 - 23 mg/dL   Creatinine, Ser 0.68 0.44 - 1.00 mg/dL   Calcium 8.8 (L) 8.9 - 10.3 mg/dL   Total Protein 7.0 6.5 - 8.1 g/dL   Albumin 3.8 3.5 - 5.0 g/dL   AST 18 15 - 41 U/L   ALT 17 0 - 44 U/L   Alkaline Phosphatase 52 38 - 126 U/L   Total Bilirubin 0.4 0.3 - 1.2 mg/dL   GFR,  Estimated >60 >60 mL/min    Comment: (NOTE) Calculated using the CKD-EPI Creatinine Equation (2021)    Anion gap 9 5 - 15  Comment: Performed at [facility], [place] [address], [place], [place] [address]  Brain natriuretic peptide     Status: Abnormal   Collection Time: 01/24/22  5:15 PM  Result Value Ref Range   B Natriuretic Peptide 137.3 (H) 0.0 - 100.0 pg/mL    Comment: Performed at [facility], [place] [address], [place], [place] [address]  Troponin I (High Sensitivity)     Status: None   Collection Time: 01/24/22  5:15 PM  Result Value Ref Range   Troponin I (High Sensitivity) 17 <18 ng/L    Comment: (NOTE) Elevated high sensitivity troponin I (hsTnI) values and significant  changes across serial measurements may suggest ACS but many other  chronic and acute conditions are known to elevate hsTnI results.  Refer to the "Links" section for chest pain algorithms and additional  guidance. Performed at [facility], [place] [address], [place], [place] [address]   Troponin I (High Sensitivity)     Status: Abnormal   Collection Time: 01/24/22  8:14 PM  Result Value Ref Range   Troponin I (High Sensitivity) 20 (H) <18 ng/L    Comment: (NOTE) Elevated high sensitivity troponin I (hsTnI) values and significant  changes across serial measurements may suggest ACS but many other  chronic and acute conditions are known to elevate hsTnI results.  Refer to the "Links" section for chest pain algorithms and additional  guidance. Performed at [facility], [place] [address], [place], [place] [address]   Basic metabolic panel     Status: Abnormal   Collection Time: 01/25/22  4:09 AM  Result Value Ref Range   Sodium 140 135 - 145 mmol/L   Potassium 3.8 3.5 - 5.1 mmol/L   Chloride 108 98 - 111 mmol/L   CO2 24 22 - 32 mmol/L   Glucose, Bld 98 70 - 99 mg/dL    Comment: Glucose reference range applies only to samples taken  after fasting for at least 8 hours.   BUN 17 8 - 23 mg/dL   Creatinine, Ser 0.74 0.44 - 1.00 mg/dL   Calcium 8.3 (L) 8.9 - 10.3 mg/dL   GFR, Estimated >60 >60 mL/min    Comment: (NOTE) Calculated using the CKD-EPI Creatinine Equation (2021)    Anion gap 8 5 - 15    Comment: Performed at [place] [address], [place],  [address]   NM PET CT CARDIAC PERFUSION MULTI W/ABSOLUTE BLOODFLOW  Result Date: 01/24/2022   Findings suggest severe multivessel CAD.  While perfusion images only show inferolateral ischemia, there are multiple findings suggestive of severe multivessel CAD, including drop in EF with stress, marked TID (1.45), diffuse ST depressions following lexiscan, severe coronary calcifications on CT, and decreased flow reserve globally and in each coronary distribution.  Cardiac catherization recommended.   Diffuse horizontal ST depressions during recovery   LV perfusion is abnormal. There is evidence of ischemia. Defect 1: There is a small defect with mild reduction in uptake present in the mid to basal inferolateral location(s) that is reversible. There is normal wall motion in the defect area. Consistent with ischemia.   Rest left ventricular function is normal. Rest EF: 63 %. Stress left ventricular function is abnormal. Stress global function is mildly reduced. Stress EF: 50 %. End diastolic cavity size is normal. End systolic cavity size is normal.   Myocardial blood flow was computed to be 1.56m/g/min at rest and 1.730mg/min at stress. Global myocardial blood flow  reserve was 1.70 and was abnormal.   Coronary calcium was present on the attenuation correction CT images. Severe coronary calcifications were present. Coronary calcifications were present in the left anterior descending artery, left circumflex artery and right coronary artery distribution(s).   Findings are consistent with ischemia. The study is high risk.   Electronically signed by Oswaldo Milian, MD  CLINICAL DATA:  This over-read does not include interpretation of cardiac or coronary anatomy or pathology. The cardiac PET-CT interpretation by the cardiologist is attached. COMPARISON:  None Available. FINDINGS: Vascular: Atherosclerotic calcifications involving the aorta and coronary arteries. No ascending thoracic aortic aneurysm. Mediastinum/Nodes: No mediastinal or hilar mass or adenopathy. Small scattered lymph nodes are noted. The esophagus is grossly normal. Lungs/Pleura: Mosaic pattern of ground-glass attenuation in the lungs new since prior cardiac CT. This can be seen with asymmetric pulmonary edema, small airways disease such as asthma or constrictive bronchiolitis. Other possibilities would include cryptogenic organizing pneumonia or hypersensitivity pneumonitis. No pulmonary lesions. No pleural effusions. Upper Abdomen: No significant upper abdominal findings. Moderate vascular calcifications. Musculoskeletal: No breast masses or bone lesions. IMPRESSION: 1. Mosaic pattern of ground-glass attenuation in the lungs new since prior cardiac CT. This can be seen with asymmetric pulmonary edema, small airways disease such as asthma or constrictive bronchiolitis. Other possibilities would include cryptogenic organizing pneumonia or hypersensitivity pneumonitis. 2. No mediastinal or hilar mass or adenopathy. 3. Aortic and coronary artery calcifications. Electronically Signed   By: Marijo Sanes M.D.   On: 01/24/2022 16:37   Pending Labs Unresulted Labs (From admission, onward)    None       Vitals/Pain Today's Vitals   01/25/22 0930 01/25/22 0945 01/25/22 1000 01/25/22 1049  BP: (!) 167/76 (!) 165/73 (!) 168/72   Pulse: 72 64 63   Resp: 14 (!) 23 20   Temp:    98.1 F (36.7 C)  TempSrc:    Oral  SpO2: 97% 94% 93%   PainSc:        Isolation Precautions No active isolations  Medications Medications  aspirin EC tablet 81 mg (81 mg Oral Not Given 01/25/22 1019)  rosuvastatin (CRESTOR)  tablet 20 mg (20 mg Oral Given 01/25/22 1023)  polyvinyl alcohol (LIQUIFILM TEARS) 1.4 % ophthalmic solution 1 drop (has no administration in time range)  nitroGLYCERIN (NITROSTAT) SL tablet 0.4 mg (has no administration in time range)  acetaminophen (TYLENOL) tablet 650 mg (has no administration in time range)  ondansetron (ZOFRAN) injection 4 mg (has no administration in time range)  0.9% sodium chloride infusion (0 mL/kg/hr  101.2 kg Intravenous Stopped 01/25/22 1018)    Followed by  0.9% sodium chloride infusion (1 mL/kg/hr  101.2 kg Intravenous New Bag/Given 01/25/22 1017)  carvedilol (COREG) tablet 6.25 mg (6.25 mg Oral Given 01/25/22 1022)  aspirin chewable tablet 81 mg (81 mg Oral Given 01/25/22 1018)    Mobility walks Low fall risk      R Recommendations: See Admitting Provider Note  Report given to:   Additional Notes:

## 2022-01-25 NOTE — Progress Notes (Signed)
Nitro drip running 74mg/hr on admission. C/o back, abdominal, hip, and toe pain on admission. Per cath lab personnel, bed rest ends 2300.

## 2022-01-25 NOTE — ED Notes (Signed)
Patient is resting comfortably. 

## 2022-01-25 NOTE — H&P (View-Only) (Signed)
Cardiologist:  Harrington Challenger  Subjective:  Denies SSCP, palpitations or Dyspnea Concerns about multiple allergies including metal  Objective:  Vitals:   01/25/22 0319 01/25/22 0702 01/25/22 0704 01/25/22 0853  BP:  (!) 197/88  (!) 193/74  Pulse:  71  87  Resp:  19  18  Temp: 98.6 F (37 C)  98.2 F (36.8 C)   TempSrc: Oral  Oral   SpO2:  97%  95%    Intake/Output from previous day: No intake or output data in the 24 hours ending 01/25/22 0930  Physical Exam: Affect appropriate Obese  HEENT: normal Neck supple with no adenopathy JVP normal no bruits no thyromegaly Lungs clear with no wheezing and good diaphragmatic motion Heart:  S1/S2 no murmur, no rub, gallop or click PMI normal Abdomen: benighn, BS positve, no tenderness, no AAA no bruit.  No HSM or HJR Distal pulses intact with no bruits No edema Neuro non-focal Skin warm and dry No muscular weakness   Lab Results: Basic Metabolic Panel: Recent Labs    01/24/22 1715 01/25/22 0409  NA 141 140  K 3.9 3.8  CL 106 108  CO2 26 24  GLUCOSE 110* 98  BUN 17 17  CREATININE 0.68 0.74  CALCIUM 8.8* 8.3*   Liver Function Tests: Recent Labs    01/24/22 1715  AST 18  ALT 17  ALKPHOS 52  BILITOT 0.4  PROT 7.0  ALBUMIN 3.8   No results for input(s): "LIPASE", "AMYLASE" in the last 72 hours. CBC: Recent Labs    01/24/22 1715  WBC 7.3  NEUTROABS 4.6  HGB 14.4  HCT 42.9  MCV 88.8  PLT 226     Imaging: NM PET CT CARDIAC PERFUSION MULTI W/ABSOLUTE BLOODFLOW  Result Date: 01/24/2022   Findings suggest severe multivessel CAD.  While perfusion images only show inferolateral ischemia, there are multiple findings suggestive of severe multivessel CAD, including drop in EF with stress, marked TID (1.45), diffuse ST depressions following lexiscan, severe coronary calcifications on CT, and decreased flow reserve globally and in each coronary distribution.  Cardiac catherization recommended.   Diffuse horizontal ST  depressions during recovery   LV perfusion is abnormal. There is evidence of ischemia. Defect 1: There is a small defect with mild reduction in uptake present in the mid to basal inferolateral location(s) that is reversible. There is normal wall motion in the defect area. Consistent with ischemia.   Rest left ventricular function is normal. Rest EF: 63 %. Stress left ventricular function is abnormal. Stress global function is mildly reduced. Stress EF: 50 %. End diastolic cavity size is normal. End systolic cavity size is normal.   Myocardial blood flow was computed to be 1.54m/g/min at rest and 1.728mg/min at stress. Global myocardial blood flow reserve was 1.70 and was abnormal.   Coronary calcium was present on the attenuation correction CT images. Severe coronary calcifications were present. Coronary calcifications were present in the left anterior descending artery, left circumflex artery and right coronary artery distribution(s).   Findings are consistent with ischemia. The study is high risk.   Electronically signed by ChOswaldo MilianMD CLINICAL DATA:  This over-read does not include interpretation of cardiac or coronary anatomy or pathology. The cardiac PET-CT interpretation by the cardiologist is attached. COMPARISON:  None Available. FINDINGS: Vascular: Atherosclerotic calcifications involving the aorta and coronary arteries. No ascending thoracic aortic aneurysm. Mediastinum/Nodes: No mediastinal or hilar mass or adenopathy. Small scattered lymph nodes are noted. The esophagus is grossly normal. Lungs/Pleura: Mosaic  pattern of ground-glass attenuation in the lungs new since prior cardiac CT. This can be seen with asymmetric pulmonary edema, small airways disease such as asthma or constrictive bronchiolitis. Other possibilities would include cryptogenic organizing pneumonia or hypersensitivity pneumonitis. No pulmonary lesions. No pleural effusions. Upper Abdomen: No significant upper abdominal  findings. Moderate vascular calcifications. Musculoskeletal: No breast masses or bone lesions. IMPRESSION: 1. Mosaic pattern of ground-glass attenuation in the lungs new since prior cardiac CT. This can be seen with asymmetric pulmonary edema, small airways disease such as asthma or constrictive bronchiolitis. Other possibilities would include cryptogenic organizing pneumonia or hypersensitivity pneumonitis. 2. No mediastinal or hilar mass or adenopathy. 3. Aortic and coronary artery calcifications. Electronically Signed   By: Marijo Sanes M.D.   On: 01/24/2022 16:37   Cardiac Studies:  ECG: SR LVH poor R wave progession   Telemetry:  NSR   Echo:   Medications:    aspirin  81 mg Oral Pre-Cath   aspirin EC  81 mg Oral Daily   rosuvastatin  20 mg Oral Daily      sodium chloride     Followed by   sodium chloride      Assessment/Plan:   CAD:  High risk PET/CT yesterday Patient saw Dr Harrington Challenger in March but did not f/u for calcium score due to husbands shoulder replacement surgery Score was high at 1051 on 12/27/21 and finally had PET/CT 01/24/21.  She does not have chest pain but does have exertional dyspnea PET/CT with many high risk features including multiple perfusion defects, TID, abnormal blood flow and drop in EF with stress suggesting multi vessel dx.  For cath today orders written on for Dr Ellyn Hack 3rd case   Shared Decision Making/Informed Consent The risks [stroke (1 in 1000), death (1 in 37), kidney failure [usually temporary] (1 in 43), bleeding (1 in 64), allergic reaction [possibly serious] (1 in 200)], benefits (diagnostic support and management of coronary artery disease) and alternatives of a cardiac catheterization were discussed in detail with Ms. Jeanell Sparrow and she is willing to proceed.   She has allergy to Cobalt, Molybdenum and Tantal In general not much metal leaching from coronary stents She should be fine with Synergy stent if needed  As it is made of platinum and chromium  which she tested negative for   [name] 01/25/2022, 9:30 AM

## 2022-01-25 NOTE — Progress Notes (Signed)
Site area: Right groin a 5 french arterial sheath was removed  Site Prior to Removal:  Level 0  Pressure Applied For 20 MINUTES    Bedrest Beginning at 1900 X 4 hours  Manual:   Yes.    Patient Status During Pull:  stable  Post Pull Groin Site:  Level 0  Post Pull Instructions Given:  Yes.    Post Pull Pulses Present:  Yes.    Dressing Applied:  Yes.    Comments:

## 2022-01-25 NOTE — ED Notes (Signed)
Pt in chair, sig other at bedside, pt back into bed, monitor replaced, pt denies chest pain, pt reports some back pain, repositioned pt, pt states that she is more comfortable.

## 2022-01-25 NOTE — Progress Notes (Signed)
Cardiologist:  Kerry [name]  Subjective:  Denies SSCP, palpitations or Dyspnea Concerns about multiple allergies including metal  Objective:  Vitals:   01/25/22 0319 01/25/22 0702 01/25/22 0704 01/25/22 0853  BP:  (!) 197/88  (!) 193/74  Pulse:  71  87  Resp:  19  18  Temp: 98.6 F (37 C)  98.2 F (36.8 C)   TempSrc: Oral  Oral   SpO2:  97%  95%    Intake/Output from previous day: No intake or output data in the 24 hours ending 01/25/22 0930  Physical Exam: Affect appropriate Obese  HEENT: normal Neck supple with no adenopathy JVP normal no bruits no thyromegaly Lungs clear with no wheezing and good diaphragmatic motion Heart:  S1/S2 no murmur, no rub, gallop or click PMI normal Abdomen: benighn, BS positve, no tenderness, no AAA no bruit.  No HSM or HJR Distal pulses intact with no bruits No edema Neuro non-focal Skin warm and dry No muscular weakness   Lab Results: Basic Metabolic Panel: Recent Labs    01/24/22 1715 01/25/22 0409  NA 141 140  K 3.9 3.8  CL 106 108  CO2 26 24  GLUCOSE 110* 98  BUN 17 17  CREATININE 0.68 0.74  CALCIUM 8.8* 8.3*   Liver Function Tests: Recent Labs    01/24/22 1715  AST 18  ALT 17  ALKPHOS 52  BILITOT 0.4  PROT 7.0  ALBUMIN 3.8   No results for input(s): "LIPASE", "AMYLASE" in the last 72 hours. CBC: Recent Labs    01/24/22 1715  WBC 7.3  NEUTROABS 4.6  HGB 14.4  HCT 42.9  MCV 88.8  PLT 226     Imaging: NM PET CT CARDIAC PERFUSION MULTI W/ABSOLUTE BLOODFLOW  Result Date: 01/24/2022   Findings suggest severe multivessel CAD.  While perfusion images only show inferolateral ischemia, there are multiple findings suggestive of severe multivessel CAD, including drop in EF with stress, marked TID (1.45), diffuse ST depressions following lexiscan, severe coronary calcifications on CT, and decreased flow reserve globally and in each coronary distribution.  Cardiac catherization recommended.   Diffuse horizontal ST  depressions during recovery   LV perfusion is abnormal. There is evidence of ischemia. Defect 1: There is a small defect with mild reduction in uptake present in the mid to basal inferolateral location(s) that is reversible. There is normal wall motion in the defect area. Consistent with ischemia.   Rest left ventricular function is normal. Rest EF: 63 %. Stress left ventricular function is abnormal. Stress global function is mildly reduced. Stress EF: 50 %. End diastolic cavity size is normal. End systolic cavity size is normal.   Myocardial blood flow was computed to be 1.52m/g/min at rest and 1.731mg/min at stress. Global myocardial blood flow reserve was 1.70 and was abnormal.   Coronary calcium was present on the attenuation correction CT images. Severe coronary calcifications were present. Coronary calcifications were present in the left anterior descending artery, left circumflex artery and right coronary artery distribution(s).   Findings are consistent with ischemia. The study is high risk.   Electronically signed by Kerry MilianMD CLINICAL DATA:  This over-read does not include interpretation of cardiac or coronary anatomy or pathology. The cardiac PET-CT interpretation by the cardiologist is attached. COMPARISON:  None Available. FINDINGS: Vascular: Atherosclerotic calcifications involving the aorta and coronary arteries. No ascending thoracic aortic aneurysm. Mediastinum/Nodes: No mediastinal or hilar mass or adenopathy. Small scattered lymph nodes are noted. The esophagus is grossly normal. Lungs/Pleura: Mosaic  pattern of ground-glass attenuation in the lungs new since prior cardiac CT. This can be seen with asymmetric pulmonary edema, small airways disease such as asthma or constrictive bronchiolitis. Other possibilities would include cryptogenic organizing pneumonia or hypersensitivity pneumonitis. No pulmonary lesions. No pleural effusions. Upper Abdomen: No significant upper abdominal  findings. Moderate vascular calcifications. Musculoskeletal: No breast masses or bone lesions. IMPRESSION: 1. Mosaic pattern of ground-glass attenuation in the lungs new since prior cardiac CT. This can be seen with asymmetric pulmonary edema, small airways disease such as asthma or constrictive bronchiolitis. Other possibilities would include cryptogenic organizing pneumonia or hypersensitivity pneumonitis. 2. No mediastinal or hilar mass or adenopathy. 3. Aortic and coronary artery calcifications. Electronically Signed   By: Kerry [name] M.D.   On: 01/24/2022 16:37   Cardiac Studies:  ECG: SR LVH poor R wave progession   Telemetry:  NSR   Echo:   Medications:    aspirin  81 mg Oral Pre-Cath   aspirin EC  81 mg Oral Daily   rosuvastatin  20 mg Oral Daily      sodium chloride     Followed by   sodium chloride      Assessment/Plan:   CAD:  High risk PET/CT yesterday Patient saw Dr Kerry [name] in March but did not f/u for calcium score due to husbands shoulder replacement surgery Score was high at 1051 on 12/27/21 and finally had PET/CT 01/24/21.  She does not have chest pain but does have exertional dyspnea PET/CT with many high risk features including multiple perfusion defects, TID, abnormal blood flow and drop in EF with stress suggesting multi vessel dx.  For cath today orders written on for Dr Kerry [name] 3rd case   Shared Decision Making/Informed Consent The risks [stroke (1 in 1000), death (1 in 74), kidney failure [usually temporary] (1 in 55), bleeding (1 in 81), allergic reaction [possibly serious] (1 in 200)], benefits (diagnostic support and management of coronary artery disease) and alternatives of a cardiac catheterization were discussed in detail with Kerry [name] and she is willing to proceed.   She has allergy to Cobalt, Molybdenum and Tantal In general not much metal leaching from coronary stents She should be fine with Synergy stent if needed  As it is made of platinum and chromium  which she tested negative for   Kerry [name] 01/25/2022, 9:30 AM

## 2022-01-25 NOTE — ED Notes (Signed)
Pt in bed with eyes closed, pt arouses easily, pt reports a decrease in lower back pain. Pt awaits cath or admission bed

## 2022-01-26 ENCOUNTER — Other Ambulatory Visit (HOSPITAL_COMMUNITY): Payer: Self-pay

## 2022-01-26 ENCOUNTER — Encounter (HOSPITAL_COMMUNITY): Payer: Self-pay | Admitting: Cardiovascular Disease

## 2022-01-26 ENCOUNTER — Other Ambulatory Visit: Payer: Self-pay

## 2022-01-26 ENCOUNTER — Encounter (HOSPITAL_COMMUNITY)
Admission: EM | Disposition: A | Discharge status: Home / Self Care | Payer: Self-pay | Source: Home / Self Care | Attending: Internal Medicine

## 2022-01-26 DIAGNOSIS — I25110 Atherosclerotic heart disease of native coronary artery with unstable angina pectoris: Secondary | ICD-10-CM

## 2022-01-26 DIAGNOSIS — Z833 Family history of diabetes mellitus: Secondary | ICD-10-CM | POA: Diagnosis not present

## 2022-01-26 DIAGNOSIS — Z91018 Allergy to other foods: Secondary | ICD-10-CM | POA: Diagnosis not present

## 2022-01-26 DIAGNOSIS — Z83511 Family history of glaucoma: Secondary | ICD-10-CM | POA: Diagnosis not present

## 2022-01-26 DIAGNOSIS — Z8261 Family history of arthritis: Secondary | ICD-10-CM | POA: Diagnosis not present

## 2022-01-26 DIAGNOSIS — Z136 Encounter for screening for cardiovascular disorders: Secondary | ICD-10-CM | POA: Diagnosis not present

## 2022-01-26 DIAGNOSIS — Z87442 Personal history of urinary calculi: Secondary | ICD-10-CM | POA: Diagnosis not present

## 2022-01-26 DIAGNOSIS — Z79899 Other long term (current) drug therapy: Secondary | ICD-10-CM | POA: Diagnosis not present

## 2022-01-26 DIAGNOSIS — K219 Gastro-esophageal reflux disease without esophagitis: Secondary | ICD-10-CM | POA: Diagnosis present

## 2022-01-26 DIAGNOSIS — Z8041 Family history of malignant neoplasm of ovary: Secondary | ICD-10-CM | POA: Diagnosis not present

## 2022-01-26 DIAGNOSIS — Z955 Presence of coronary angioplasty implant and graft: Secondary | ICD-10-CM

## 2022-01-26 DIAGNOSIS — I10 Essential (primary) hypertension: Secondary | ICD-10-CM | POA: Diagnosis present

## 2022-01-26 DIAGNOSIS — Z8249 Family history of ischemic heart disease and other diseases of the circulatory system: Secondary | ICD-10-CM | POA: Diagnosis not present

## 2022-01-26 DIAGNOSIS — I2510 Atherosclerotic heart disease of native coronary artery without angina pectoris: Secondary | ICD-10-CM | POA: Diagnosis present

## 2022-01-26 DIAGNOSIS — E7800 Pure hypercholesterolemia, unspecified: Secondary | ICD-10-CM | POA: Diagnosis present

## 2022-01-26 DIAGNOSIS — G4733 Obstructive sleep apnea (adult) (pediatric): Secondary | ICD-10-CM | POA: Diagnosis present

## 2022-01-26 DIAGNOSIS — Z881 Allergy status to other antibiotic agents status: Secondary | ICD-10-CM | POA: Diagnosis not present

## 2022-01-26 DIAGNOSIS — Z7982 Long term (current) use of aspirin: Secondary | ICD-10-CM | POA: Diagnosis not present

## 2022-01-26 DIAGNOSIS — Z888 Allergy status to other drugs, medicaments and biological substances status: Secondary | ICD-10-CM | POA: Diagnosis not present

## 2022-01-26 DIAGNOSIS — R9431 Abnormal electrocardiogram [ECG] [EKG]: Secondary | ICD-10-CM | POA: Diagnosis present

## 2022-01-26 DIAGNOSIS — I25700 Atherosclerosis of coronary artery bypass graft(s), unspecified, with unstable angina pectoris: Secondary | ICD-10-CM | POA: Diagnosis present

## 2022-01-26 DIAGNOSIS — E669 Obesity, unspecified: Secondary | ICD-10-CM | POA: Diagnosis present

## 2022-01-26 DIAGNOSIS — Z6841 Body Mass Index (BMI) 40.0 and over, adult: Secondary | ICD-10-CM | POA: Diagnosis not present

## 2022-01-26 DIAGNOSIS — Z880 Allergy status to penicillin: Secondary | ICD-10-CM | POA: Diagnosis not present

## 2022-01-26 DIAGNOSIS — R9439 Abnormal result of other cardiovascular function study: Secondary | ICD-10-CM | POA: Diagnosis not present

## 2022-01-26 DIAGNOSIS — E785 Hyperlipidemia, unspecified: Secondary | ICD-10-CM | POA: Diagnosis present

## 2022-01-26 HISTORY — PX: CORONARY STENT INTERVENTION: CATH118234

## 2022-01-26 HISTORY — DX: Presence of coronary angioplasty implant and graft: Z95.5

## 2022-01-26 LAB — BASIC METABOLIC PANEL
Anion gap: 7 (ref 5–15)
BUN: 18 mg/dL (ref 8–23)
CO2: 24 mmol/L (ref 22–32)
Calcium: 8.3 mg/dL — ABNORMAL LOW (ref 8.9–10.3)
Chloride: 107 mmol/L (ref 98–111)
Creatinine, Ser: 0.62 mg/dL (ref 0.44–1.00)
GFR, Estimated: >60 mL/min (ref >60)
Glucose, Bld: 121 mg/dL — ABNORMAL HIGH (ref 70–99)
Potassium: 3.7 mmol/L (ref 3.5–5.1)
Sodium: 138 mmol/L (ref 135–145)

## 2022-01-26 LAB — CBC
HCT: 41.2 % (ref 36.0–46.0)
Hemoglobin: 13.8 g/dL (ref 12.0–15.0)
MCH: 29.7 pg (ref 26.0–34.0)
MCHC: 33.5 g/dL (ref 30.0–36.0)
MCV: 88.6 fL (ref 80.0–100.0)
Platelets: 220 10*3/uL (ref 150–400)
RBC: 4.65 MIL/uL (ref 3.87–5.11)
RDW: 13.9 % (ref 11.5–15.5)
WBC: 9.6 10*3/uL (ref 4.0–10.5)
nRBC: 0 % (ref 0.0–0.2)

## 2022-01-26 LAB — POCT ACTIVATED CLOTTING TIME: Activated Clotting Time: 369 seconds

## 2022-01-26 SURGERY — CORONARY STENT INTERVENTION
Anesthesia: LOCAL

## 2022-01-26 MED ORDER — HEPARIN SODIUM (PORCINE) 1000 UNIT/ML IJ SOLN
INTRAMUSCULAR | Status: DC | PRN
  Administered 2022-01-26: 10000 [IU] via INTRAVENOUS

## 2022-01-26 MED ORDER — FENTANYL CITRATE (PF) 100 MCG/2ML IJ SOLN
INTRAMUSCULAR | Status: DC | PRN
  Administered 2022-01-26: 25 ug via INTRAVENOUS

## 2022-01-26 MED ORDER — SODIUM CHLORIDE 0.9% FLUSH: 3.0000 mL | INTRAVENOUS | Status: DC | PRN

## 2022-01-26 MED ORDER — MIDAZOLAM HCL 2 MG/2ML IJ SOLN
INTRAMUSCULAR | Status: DC | PRN
  Administered 2022-01-26: 1 mg via INTRAVENOUS

## 2022-01-26 MED ORDER — SODIUM CHLORIDE 0.9 % WEIGHT BASED INFUSION
3.0000 mL/kg/h | INTRAVENOUS | Status: DC
  Administered 2022-01-26: 3 mL/kg/h via INTRAVENOUS

## 2022-01-26 MED ORDER — VERAPAMIL HCL 2.5 MG/ML IV SOLN
INTRAVENOUS | Status: AC
  Filled 2022-01-26: qty 2

## 2022-01-26 MED ORDER — HEPARIN (PORCINE) IN NACL 1000-0.9 UT/500ML-% IV SOLN
INTRAVENOUS | Status: DC | PRN
  Administered 2022-01-26 (×2): 500 mL

## 2022-01-26 MED ORDER — VERAPAMIL HCL 2.5 MG/ML IV SOLN
INTRAVENOUS | Status: DC | PRN
  Administered 2022-01-26: 10 mL via INTRA_ARTERIAL

## 2022-01-26 MED ORDER — LABETALOL HCL 5 MG/ML IV SOLN: 10.0000 mg | INTRAVENOUS | Status: AC | PRN

## 2022-01-26 MED ORDER — HEPARIN (PORCINE) IN NACL 1000-0.9 UT/500ML-% IV SOLN
INTRAVENOUS | Status: AC
  Filled 2022-01-26: qty 1000

## 2022-01-26 MED ORDER — SODIUM CHLORIDE 0.9% FLUSH
3.0000 mL | Freq: Two times a day (BID) | INTRAVENOUS | Status: DC
  Administered 2022-01-26: 3 mL via INTRAVENOUS

## 2022-01-26 MED ORDER — HYDRALAZINE HCL 20 MG/ML IJ SOLN
INTRAMUSCULAR | Status: AC
  Filled 2022-01-26: qty 1

## 2022-01-26 MED ORDER — MIDAZOLAM HCL 2 MG/2ML IJ SOLN
INTRAMUSCULAR | Status: AC
  Filled 2022-01-26: qty 2

## 2022-01-26 MED ORDER — NITROGLYCERIN 1 MG/10 ML FOR IR/CATH LAB
INTRA_ARTERIAL | Status: AC
  Filled 2022-01-26: qty 10

## 2022-01-26 MED ORDER — LIDOCAINE HCL (PF) 1 % IJ SOLN
INTRAMUSCULAR | Status: AC
  Filled 2022-01-26: qty 30

## 2022-01-26 MED ORDER — LIDOCAINE HCL (PF) 1 % IJ SOLN
INTRAMUSCULAR | Status: DC | PRN
  Administered 2022-01-26: 2 mL

## 2022-01-26 MED ORDER — FENTANYL CITRATE (PF) 100 MCG/2ML IJ SOLN
INTRAMUSCULAR | Status: AC
  Filled 2022-01-26: qty 2

## 2022-01-26 MED ORDER — HEPARIN SODIUM (PORCINE) 1000 UNIT/ML IJ SOLN
INTRAMUSCULAR | Status: AC
  Filled 2022-01-26: qty 10

## 2022-01-26 MED ORDER — SODIUM CHLORIDE 0.9 % IV SOLN: 250.0000 mL | INTRAVENOUS | Status: DC | PRN

## 2022-01-26 MED ORDER — CLOPIDOGREL BISULFATE 75 MG PO TABS
600.0000 mg | ORAL_TABLET | Freq: Once | ORAL | Status: AC
  Administered 2022-01-26: 600 mg via ORAL
  Filled 2022-01-26: qty 8

## 2022-01-26 MED ORDER — ENOXAPARIN SODIUM 40 MG/0.4ML IJ SOSY: 40.0000 mg | PREFILLED_SYRINGE | INTRAMUSCULAR | Status: DC

## 2022-01-26 MED ORDER — IOHEXOL 350 MG/ML SOLN
INTRAVENOUS | Status: DC | PRN
  Administered 2022-01-26: 80 mL

## 2022-01-26 MED ORDER — ASPIRIN 81 MG PO CHEW: 81.0000 mg | CHEWABLE_TABLET | ORAL | Status: AC

## 2022-01-26 MED ORDER — SODIUM CHLORIDE 0.9 % WEIGHT BASED INFUSION
1.0000 mL/kg/h | INTRAVENOUS | Status: DC
  Administered 2022-01-26: 1 mL/kg/h via INTRAVENOUS

## 2022-01-26 MED ORDER — HYDRALAZINE HCL 20 MG/ML IJ SOLN: 10.0000 mg | INTRAMUSCULAR | Status: AC | PRN

## 2022-01-26 MED ORDER — SODIUM CHLORIDE 0.9 % IV SOLN: INTRAVENOUS | Status: AC

## 2022-01-26 MED FILL — Verapamil HCl IV Soln 2.5 MG/ML: INTRAVENOUS | Qty: 2 | Status: AC

## 2022-01-26 SURGICAL SUPPLY — 21 items
BALL SAPPHIRE NC24 2.50X15 (BALLOONS) ×1
BALLN SAPPHIRE 2.0X12 (BALLOONS) ×1
BALLOON SAPPHIRE 2.0X12 (BALLOONS) IMPLANT
BALLOON SAPPHIRE NC24 2.50X15 (BALLOONS) IMPLANT
CATH LAUNCHER 6FR EBU3.5 (CATHETERS) IMPLANT
DEVICE RAD COMP TR BAND LRG (VASCULAR PRODUCTS) IMPLANT
ELECT DEFIB PAD ADLT CADENCE (PAD) IMPLANT
GLIDESHEATH SLEND SS 6F .021 (SHEATH) IMPLANT
GUIDEWIRE INQWIRE 1.5J.035X260 (WIRE) IMPLANT
INQWIRE 1.5J .035X260CM (WIRE) ×1
KIT ENCORE 26 ADVANTAGE (KITS) IMPLANT
KIT HEART LEFT (KITS) ×1 IMPLANT
PACK CARDIAC CATHETERIZATION (CUSTOM PROCEDURE TRAY) ×1 IMPLANT
PROTECTION STATION PRESSURIZED (MISCELLANEOUS) ×1
SHEATH PROBE COVER 6X72 (BAG) IMPLANT
STATION PROTECTION PRESSURIZED (MISCELLANEOUS) IMPLANT
STENT SYNERGY XD 2.25X20 (Permanent Stent) IMPLANT
SYNERGY XD 2.25X20 (Permanent Stent) ×1 IMPLANT
TRANSDUCER W/STOPCOCK (MISCELLANEOUS) ×1 IMPLANT
TUBING CIL FLEX 10 FLL-RA (TUBING) ×1 IMPLANT
WIRE COUGAR XT STRL 190CM (WIRE) IMPLANT

## 2022-01-26 NOTE — H&P (View-Only) (Signed)
Cardologist:  Agustin Cree  Subjective:  Denies SSCP, palpitations or Dyspnea Sore from laying on table for diagnostic cath yesterday   Objective:  Vitals:   01/25/22 2200 01/25/22 2334 01/26/22 0536 01/26/22 0729  BP: (!) 165/83 (!) 134/58 (!) 120/54 (!) 154/74  Pulse: (!) 101 98  79  Resp: 16 (!) 23 (!) 22 20  Temp:  98 F (36.7 C) 98 F (36.7 C) 98 F (36.7 C)  TempSrc:  Oral Oral Oral  SpO2: 94% 94%  95%  Weight:   102.3 kg   Height:        Intake/Output from previous day:  Intake/Output Summary (Last 24 hours) at 01/26/2022 9798 Last data filed at 01/25/2022 2318 Gross per 24 hour  Intake 720 ml  Output 600 ml  Net 120 ml    Physical Exam:  Obese female Lungs clear No murmur Abdomen benign Trace edema  Mild bruising right radial failed access RFA ok post procedure   Lab Results: Basic Metabolic Panel: Recent Labs    01/25/22 0409 01/26/22 0202  NA 140 138  K 3.8 3.7  CL 108 107  CO2 24 24  GLUCOSE 98 121*  BUN 17 18  CREATININE 0.74 0.62  CALCIUM 8.3* 8.3*   Liver Function Tests: Recent Labs    01/24/22 1715  AST 18  ALT 17  ALKPHOS 52  BILITOT 0.4  PROT 7.0  ALBUMIN 3.8   No results for input(s): "LIPASE", "AMYLASE" in the last 72 hours. CBC: Recent Labs    01/24/22 1715 01/25/22 2013 01/26/22 0202  WBC 7.3 10.7* 9.6  NEUTROABS 4.6  --   --   HGB 14.4 14.4 13.8  HCT 42.9 44.4 41.2  MCV 88.8 89.9 88.6  PLT 226 221 220     Imaging: CARDIAC CATHETERIZATION  Result Date: 01/25/2022   2nd Diag lesion is 60% stenosed.   Mid Cx lesion is 80% stenosed.   3rd Mrg lesion is 90% stenosed.   Prox Cx lesion is 20% stenosed.   Prox RCA lesion is 40% stenosed.   Mid RCA lesion is 10% stenosed.   Dist RCA lesion is 20% stenosed.   RPDA-1 lesion is 30% stenosed.   RPDA-2 lesion is 70% stenosed.   LV end diastolic pressure is severely elevated. Multivessel CAD with 60% ostial narrowing of the second diagonal branch of the LAD; 20% proximal  circumflex stenosis with mid circumflex bifurcation stenosis of 80 and 90%; dominant RCA with 40% proximal stenosis, mild 10 and 20% mid stenoses, and 30 and 70% mid and distal PDA stenosis. Serve global LV contractility with EF estimated 55 to 60%.  Elevated LVEDP at 30 mm. Significant blood pressure elevation for which the patient received hydralazine 10 mg x 2 post procedure. RECOMMENDATION: Patient most likely will need PCI for her bifurcation circumflex stenosis.  She was very concerned about potential metal allergies.  We reviewed the stents with her and her concern with the molybdenum which is present in both the Synergy and Medtronic stents.  The Synergy stent also has platinum chromium in the Resolute stent platinum iridium in addition to cobalt alloy inner core.  Will discuss with his representatives.  Plan intervention tomorrow once further discussion obtained with patient family and device reps.   NM PET CT CARDIAC PERFUSION MULTI W/ABSOLUTE BLOODFLOW  Result Date: 01/24/2022   Findings suggest severe multivessel CAD.  While perfusion images only show inferolateral ischemia, there are multiple findings suggestive of severe multivessel CAD, including drop in  EF with stress, marked TID (1.45), diffuse ST depressions following lexiscan, severe coronary calcifications on CT, and decreased flow reserve globally and in each coronary distribution.  Cardiac catherization recommended.   Diffuse horizontal ST depressions during recovery   LV perfusion is abnormal. There is evidence of ischemia. Defect 1: There is a small defect with mild reduction in uptake present in the mid to basal inferolateral location(s) that is reversible. There is normal wall motion in the defect area. Consistent with ischemia.   Rest left ventricular function is normal. Rest EF: 63 %. Stress left ventricular function is abnormal. Stress global function is mildly reduced. Stress EF: 50 %. End diastolic cavity size is normal. End  systolic cavity size is normal.   Myocardial blood flow was computed to be 1.59m/g/min at rest and 1.798mg/min at stress. Global myocardial blood flow reserve was 1.70 and was abnormal.   Coronary calcium was present on the attenuation correction CT images. Severe coronary calcifications were present. Coronary calcifications were present in the left anterior descending artery, left circumflex artery and right coronary artery distribution(s).   Findings are consistent with ischemia. The study is high risk.   Electronically signed by ChOswaldo MilianMD CLINICAL DATA:  This over-read does not include interpretation of cardiac or coronary anatomy or pathology. The cardiac PET-CT interpretation by the cardiologist is attached. COMPARISON:  None Available. FINDINGS: Vascular: Atherosclerotic calcifications involving the aorta and coronary arteries. No ascending thoracic aortic aneurysm. Mediastinum/Nodes: No mediastinal or hilar mass or adenopathy. Small scattered lymph nodes are noted. The esophagus is grossly normal. Lungs/Pleura: Mosaic pattern of ground-glass attenuation in the lungs new since prior cardiac CT. This can be seen with asymmetric pulmonary edema, small airways disease such as asthma or constrictive bronchiolitis. Other possibilities would include cryptogenic organizing pneumonia or hypersensitivity pneumonitis. No pulmonary lesions. No pleural effusions. Upper Abdomen: No significant upper abdominal findings. Moderate vascular calcifications. Musculoskeletal: No breast masses or bone lesions. IMPRESSION: 1. Mosaic pattern of ground-glass attenuation in the lungs new since prior cardiac CT. This can be seen with asymmetric pulmonary edema, small airways disease such as asthma or constrictive bronchiolitis. Other possibilities would include cryptogenic organizing pneumonia or hypersensitivity pneumonitis. 2. No mediastinal or hilar mass or adenopathy. 3. Aortic and coronary artery calcifications.  Electronically Signed   By: P.Marijo Sanes.D.   On: 01/24/2022 16:37   Cardiac Studies:  ECG: SR no acute changes    Telemetry:  NSR   Echo: 04/08/21 EF 60-65%   Medications:    aspirin EC  81 mg Oral Daily   carvedilol  6.25 mg Oral BID   clopidogrel  600 mg Oral Once   enoxaparin (LOVENOX) injection  40 mg Subcutaneous Q24H   rosuvastatin  20 mg Oral Daily   sodium chloride flush  3 mL Intravenous Q12H   sodium chloride flush  3 mL Intravenous Q12H      sodium chloride     sodium chloride     sodium chloride 3 mL/kg/hr (01/26/22 0916)   Followed by   sodium chloride     nitroGLYCERIN 10 mcg/min (01/25/22 2124)    Assessment/Plan:   CAD:  high risk PET / CT scan Diagnostic cath with bifurcation LCX lesion involving two large OM's , mid PDA lesion and diffuse dx in mid/distal LCX Reviewed films with CM.  Plan for stenting of LCX lesions today with double wire  Discussed with Dr CoBurt Knackesterday and Synergy stent with platinum should be fine with minimal leaching of  any metals with DES.  CM will make decision about medical Rx vs intervention to PDA Continue DAT, coreg and statin   [name] 01/26/2022, 9:37 AM

## 2022-01-26 NOTE — TOC Progression Note (Addendum)
Transition of Care [facility]) - Progression Note    Patient Details  Name: Kerry [name] MRN: [id number] Date of Birth: 12-28-1950  [facility] CM/SW Contact  Zenon Mayo, RN Phone Number: 01/26/2022, 1:00 PM  Clinical Narrative:    From home  with spouse, for stent today, benefit check in process for brilinta.        Expected Discharge Plan and Services                                               Social Determinants of Health (SDOH) Interventions SDOH Screenings   Food Insecurity: No Food Insecurity (01/25/2022)  Housing: Low Risk  (01/25/2022)  Transportation Needs: No Transportation Needs (01/25/2022)  Utilities: Not At Risk (01/25/2022)  Tobacco Use: Low Risk  (01/26/2022)    Readmission Risk Interventions     No data to display

## 2022-01-26 NOTE — Progress Notes (Signed)
Cardologist:  Agustin Cree  Subjective:  Denies SSCP, palpitations or Dyspnea Sore from laying on table for diagnostic cath yesterday   Objective:  Vitals:   01/25/22 2200 01/25/22 2334 01/26/22 0536 01/26/22 0729  BP: (!) 165/83 (!) 134/58 (!) 120/54 (!) 154/74  Pulse: (!) 101 98  79  Resp: 16 (!) 23 (!) 22 20  Temp:  98 F (36.7 C) 98 F (36.7 C) 98 F (36.7 C)  TempSrc:  Oral Oral Oral  SpO2: 94% 94%  95%  Weight:   102.3 kg   Height:        Intake/Output from previous day:  Intake/Output Summary (Last 24 hours) at 01/26/2022 4010 Last data filed at 01/25/2022 2318 Gross per 24 hour  Intake 720 ml  Output 600 ml  Net 120 ml    Physical Exam:  Obese female Lungs clear No murmur Abdomen benign Trace edema  Mild bruising right radial failed access RFA ok post procedure   Lab Results: Basic Metabolic Panel: Recent Labs    01/25/22 0409 01/26/22 0202  NA 140 138  K 3.8 3.7  CL 108 107  CO2 24 24  GLUCOSE 98 121*  BUN 17 18  CREATININE 0.74 0.62  CALCIUM 8.3* 8.3*   Liver Function Tests: Recent Labs    01/24/22 1715  AST 18  ALT 17  ALKPHOS 52  BILITOT 0.4  PROT 7.0  ALBUMIN 3.8   No results for input(s): "LIPASE", "AMYLASE" in the last 72 hours. CBC: Recent Labs    01/24/22 1715 01/25/22 2013 01/26/22 0202  WBC 7.3 10.7* 9.6  NEUTROABS 4.6  --   --   HGB 14.4 14.4 13.8  HCT 42.9 44.4 41.2  MCV 88.8 89.9 88.6  PLT 226 221 220     Imaging: CARDIAC CATHETERIZATION  Result Date: 01/25/2022   2nd Diag lesion is 60% stenosed.   Mid Cx lesion is 80% stenosed.   3rd Mrg lesion is 90% stenosed.   Prox Cx lesion is 20% stenosed.   Prox RCA lesion is 40% stenosed.   Mid RCA lesion is 10% stenosed.   Dist RCA lesion is 20% stenosed.   RPDA-1 lesion is 30% stenosed.   RPDA-2 lesion is 70% stenosed.   LV end diastolic pressure is severely elevated. Multivessel CAD with 60% ostial narrowing of the second diagonal branch of the LAD; 20% proximal  circumflex stenosis with mid circumflex bifurcation stenosis of 80 and 90%; dominant RCA with 40% proximal stenosis, mild 10 and 20% mid stenoses, and 30 and 70% mid and distal PDA stenosis. Serve global LV contractility with EF estimated 55 to 60%.  Elevated LVEDP at 30 mm. Significant blood pressure elevation for which the patient received hydralazine 10 mg x 2 post procedure. RECOMMENDATION: Patient most likely will need PCI for her bifurcation circumflex stenosis.  She was very concerned about potential metal allergies.  We reviewed the stents with her and her concern with the molybdenum which is present in both the Synergy and Medtronic stents.  The Synergy stent also has platinum chromium in the Resolute stent platinum iridium in addition to cobalt alloy inner core.  Will discuss with his representatives.  Plan intervention tomorrow once further discussion obtained with patient family and device reps.   NM PET CT CARDIAC PERFUSION MULTI W/ABSOLUTE BLOODFLOW  Result Date: 01/24/2022   Findings suggest severe multivessel CAD.  While perfusion images only show inferolateral ischemia, there are multiple findings suggestive of severe multivessel CAD, including drop in  EF with stress, marked TID (1.45), diffuse ST depressions following lexiscan, severe coronary calcifications on CT, and decreased flow reserve globally and in each coronary distribution.  Cardiac catherization recommended.   Diffuse horizontal ST depressions during recovery   LV perfusion is abnormal. There is evidence of ischemia. Defect 1: There is a small defect with mild reduction in uptake present in the mid to basal inferolateral location(s) that is reversible. There is normal wall motion in the defect area. Consistent with ischemia.   Rest left ventricular function is normal. Rest EF: 63 %. Stress left ventricular function is abnormal. Stress global function is mildly reduced. Stress EF: 50 %. End diastolic cavity size is normal. End  systolic cavity size is normal.   Myocardial blood flow was computed to be 1.80m/g/min at rest and 1.79mg/min at stress. Global myocardial blood flow reserve was 1.70 and was abnormal.   Coronary calcium was present on the attenuation correction CT images. Severe coronary calcifications were present. Coronary calcifications were present in the left anterior descending artery, left circumflex artery and right coronary artery distribution(s).   Findings are consistent with ischemia. The study is high risk.   Electronically signed by ChOswaldo MilianMD CLINICAL DATA:  This over-read does not include interpretation of cardiac or coronary anatomy or pathology. The cardiac PET-CT interpretation by the cardiologist is attached. COMPARISON:  None Available. FINDINGS: Vascular: Atherosclerotic calcifications involving the aorta and coronary arteries. No ascending thoracic aortic aneurysm. Mediastinum/Nodes: No mediastinal or hilar mass or adenopathy. Small scattered lymph nodes are noted. The esophagus is grossly normal. Lungs/Pleura: Mosaic pattern of ground-glass attenuation in the lungs new since prior cardiac CT. This can be seen with asymmetric pulmonary edema, small airways disease such as asthma or constrictive bronchiolitis. Other possibilities would include cryptogenic organizing pneumonia or hypersensitivity pneumonitis. No pulmonary lesions. No pleural effusions. Upper Abdomen: No significant upper abdominal findings. Moderate vascular calcifications. Musculoskeletal: No breast masses or bone lesions. IMPRESSION: 1. Mosaic pattern of ground-glass attenuation in the lungs new since prior cardiac CT. This can be seen with asymmetric pulmonary edema, small airways disease such as asthma or constrictive bronchiolitis. Other possibilities would include cryptogenic organizing pneumonia or hypersensitivity pneumonitis. 2. No mediastinal or hilar mass or adenopathy. 3. Aortic and coronary artery calcifications.  Electronically Signed   By: P.Marijo Sanes.D.   On: 01/24/2022 16:37   Cardiac Studies:  ECG: SR no acute changes    Telemetry:  NSR   Echo: 04/08/21 EF 60-65%   Medications:    aspirin EC  81 mg Oral Daily   carvedilol  6.25 mg Oral BID   clopidogrel  600 mg Oral Once   enoxaparin (LOVENOX) injection  40 mg Subcutaneous Q24H   rosuvastatin  20 mg Oral Daily   sodium chloride flush  3 mL Intravenous Q12H   sodium chloride flush  3 mL Intravenous Q12H      sodium chloride     sodium chloride     sodium chloride 3 mL/kg/hr (01/26/22 0916)   Followed by   sodium chloride     nitroGLYCERIN 10 mcg/min (01/25/22 2124)    Assessment/Plan:   CAD:  high risk PET / CT scan Diagnostic cath with bifurcation LCX lesion involving two large OM's , mid PDA lesion and diffuse dx in mid/distal LCX Reviewed films with CM.  Plan for stenting of LCX lesions today with double wire  Discussed with Dr CoBurt Knackesterday and Synergy stent with platinum should be fine with minimal leaching of  any metals with DES.  CM will make decision about medical Rx vs intervention to PDA Continue DAT, coreg and statin   [name] 01/26/2022, 9:37 AM

## 2022-01-26 NOTE — TOC Benefit Eligibility Note (Signed)
Patient Teacher, English as a foreign language completed.    The patient is currently admitted and upon discharge could be taking Brilinta 90 mg.  The current 30 day co-pay is $47.00.   The patient is insured through [place], [place] Patient [place] Patient Advocate Team Direct Number: [phone]  Fax: [phone]

## 2022-01-26 NOTE — Interval H&P Note (Signed)
History and Physical Interval Note:  01/26/2022 1:11 PM  Kerry [name]  has presented today for surgery, with the diagnosis of CAD.  The various methods of treatment have been discussed with the patient and family. After consideration of risks, benefits and other options for treatment, the patient has consented to  Procedure(s): CORONARY STENT INTERVENTION (N/A) as a surgical intervention.  The patient's history has been reviewed, patient examined, no change in status, stable for surgery.  I have reviewed the patient's chart and labs.  Questions were answered to the patient's satisfaction.    Cath Lab Visit (complete for each Cath Lab visit)  Clinical Evaluation Leading to the Procedure:   ACS: No.  Non-ACS:    Anginal Classification: CCS III  Anti-ischemic medical therapy: No Therapy  Non-Invasive Test Results: High-risk stress test findings: cardiac mortality >3%/year  Prior CABG: No previous CABG        Lauree Chandler

## 2022-01-26 NOTE — Progress Notes (Addendum)
Removed TR band from left radial. Site appeared to be oozing and small hematoma present. Manual pressure applied  for 10 minutes to site and hematoma. Applied Gauze and Tegaderm to site. Site is a level 1 for bruising. Educated patient to notify staff if she notices any bleeding. Leave dressing in place 24 hrs. Pt verbalizes understanding.

## 2022-01-27 ENCOUNTER — Encounter (HOSPITAL_COMMUNITY): Payer: Self-pay | Admitting: Cardiology

## 2022-01-27 ENCOUNTER — Other Ambulatory Visit (HOSPITAL_COMMUNITY): Payer: Self-pay

## 2022-01-27 DIAGNOSIS — I2510 Atherosclerotic heart disease of native coronary artery without angina pectoris: Secondary | ICD-10-CM | POA: Diagnosis not present

## 2022-01-27 DIAGNOSIS — R9439 Abnormal result of other cardiovascular function study: Secondary | ICD-10-CM

## 2022-01-27 LAB — CBC
HCT: 37.1 % (ref 36.0–46.0)
Hemoglobin: 12.6 g/dL (ref 12.0–15.0)
MCH: 29.9 pg (ref 26.0–34.0)
MCHC: 34 g/dL (ref 30.0–36.0)
MCV: 88.1 fL (ref 80.0–100.0)
Platelets: 203 10*3/uL (ref 150–400)
RBC: 4.21 MIL/uL (ref 3.87–5.11)
RDW: 14.3 % (ref 11.5–15.5)
WBC: 8 10*3/uL (ref 4.0–10.5)
nRBC: 0 % (ref 0.0–0.2)

## 2022-01-27 LAB — BASIC METABOLIC PANEL
Anion gap: 9 (ref 5–15)
BUN: 13 mg/dL (ref 8–23)
CO2: 20 mmol/L — ABNORMAL LOW (ref 22–32)
Calcium: 7.8 mg/dL — ABNORMAL LOW (ref 8.9–10.3)
Chloride: 108 mmol/L (ref 98–111)
Creatinine, Ser: 0.69 mg/dL (ref 0.44–1.00)
GFR, Estimated: >60 mL/min (ref >60)
Glucose, Bld: 98 mg/dL (ref 70–99)
Potassium: 3.5 mmol/L (ref 3.5–5.1)
Sodium: 137 mmol/L (ref 135–145)

## 2022-01-27 LAB — LIPOPROTEIN A (LPA): Lipoprotein (a): 120.1 nmol/L — ABNORMAL HIGH (ref <75.0)

## 2022-01-27 MED ORDER — CLOPIDOGREL BISULFATE 75 MG PO TABS
75.0000 mg | ORAL_TABLET | Freq: Every day | ORAL | 3 refills | Status: DC
  Filled 2022-01-27: qty 90, 90d supply, fill #0

## 2022-01-27 MED ORDER — CARVEDILOL 12.5 MG PO TABS
12.5000 mg | ORAL_TABLET | Freq: Two times a day (BID) | ORAL | 3 refills | Status: DC
  Filled 2022-01-27: qty 180, 90d supply, fill #0

## 2022-01-27 MED ORDER — CLOPIDOGREL BISULFATE 75 MG PO TABS
75.0000 mg | ORAL_TABLET | Freq: Every day | ORAL | Status: DC
  Administered 2022-01-27: 75 mg via ORAL
  Filled 2022-01-27: qty 1

## 2022-01-27 MED ORDER — AMLODIPINE BESYLATE 5 MG PO TABS: 5.0000 mg | ORAL_TABLET | Freq: Every day | ORAL | Status: DC

## 2022-01-27 MED ORDER — CARVEDILOL 12.5 MG PO TABS
12.5000 mg | ORAL_TABLET | Freq: Two times a day (BID) | ORAL | Status: DC
  Administered 2022-01-27: 12.5 mg via ORAL
  Filled 2022-01-27: qty 1

## 2022-01-27 MED ORDER — NITROGLYCERIN 0.4 MG SL SUBL
0.4000 mg | SUBLINGUAL_TABLET | SUBLINGUAL | 1 refills | Status: DC | PRN
  Filled 2022-01-27: qty 25, 7d supply, fill #0

## 2022-01-27 NOTE — Discharge Summary (Signed)
Discharge Summary    Patient ID: Tanganyika Bowlds MRN: [id number]; DOB: July 25, 1950  Admit date: 01/24/2022 Discharge date: 01/27/2022  PCP:  [facility], [name], [place] [facility] Cardiologist:  Dorris Carnes, MD      Discharge Diagnoses    Principal Problem:   CAD, multiple vessel Active Problems:   GERD (gastroesophageal reflux disease)   Essential hypertension   OSA (obstructive sleep apnea)   CAD (coronary artery disease)    Diagnostic Studies/Procedures    Left Heart Catheterization 01/25/22   2nd Diag lesion is 60% stenosed.   Mid Cx lesion is 80% stenosed.   3rd Mrg lesion is 90% stenosed.   Prox Cx lesion is 20% stenosed.   Prox RCA lesion is 40% stenosed.   Mid RCA lesion is 10% stenosed.   Dist RCA lesion is 20% stenosed.   RPDA-1 lesion is 30% stenosed.   RPDA-2 lesion is 70% stenosed.   LV end diastolic pressure is severely elevated.   Multivessel CAD with 60% ostial narrowing of the second diagonal branch of the LAD; 20% proximal circumflex stenosis with mid circumflex bifurcation stenosis of 80 and 90%; dominant RCA with 40% proximal stenosis, mild 10 and 20% mid stenoses, and 30 and 70% mid and distal PDA stenosis.   Serve global LV contractility with EF estimated 55 to 60%.  Elevated LVEDP at 30 mm.   Significant blood pressure elevation for which the patient received hydralazine 10 mg x 2 post procedure.   RECOMMENDATION: Patient most likely will need PCI for her bifurcation circumflex stenosis.  She was very concerned about potential metal allergies.  We reviewed the stents with her and her concern with the molybdenum which is present in both the Synergy and Medtronic stents.  The Synergy stent also has platinum chromium in the Resolute stent platinum iridium in addition to cobalt alloy inner core.  Will discuss with his representatives.  Plan intervention tomorrow once further discussion obtained with patient family and device  reps. Diagnostic Dominance: Right    Coronary Stent Intervention 01/26/22   Mid Cx lesion is 80% stenosed.   Prox Cx lesion is 20% stenosed.   2nd Diag lesion is 60% stenosed.   3rd Mrg lesion is 90% stenosed.   A drug-eluting stent was successfully placed using a SYNERGY XD 2.25X20.   Balloon angioplasty was performed using a BALLN SAPPHIRE 2.0X12.   Post intervention, there is a 0% residual stenosis.   Post intervention, there is a 60% residual stenosis.   Severe stenosis in the mid Circumflex affecting flow into the first obtuse marginal branch and the second obtuse marginal branch.  Successful PTCA/DES x 1 OM1 (Synergy stent platform as felt to be the most favorable given her metal allergy profile) Successful balloon angioplasty mid Circumflex/OM2   Recommendations: DAPT with ASA and Plavix for at least six months.  Diagnostic Dominance: Right  Intervention     _____________   History of Present Illness     Wanda Tabitha Riggins is a 72 y.o. female with a past medical history of HTN, GERD, OSA. Patient previously followed by Dr. Agustin Cree, now followed by Dr. Harrington Challenger. Patient was last seen by Dr. Harrington Challenger on 03/21/2021 for an office visit, at which time patient complained of dizziness. Also noted to have elevated cholesterol. Echocardiogram on 04/08/21 showed EF 60-65%. Underwent CT cardiac scoring on 12/27/21 that showed a coronary calcium score of 1051 (95th percentile). Due to elevated coronary calcium score, underwent cardiac PET on  01/24/22 that suggested severe multivessel CAD. Following the test, patient had persistent dyspnea, palpitations, and chest tightness. She was transported to the [facility] ED for further evaluation. EKG in the ED showed lateral T wave inversions. Symptoms resolved while patient was in the ED, and she was transferred to [facility] for cardiac catheterization.   Hospital Course     Consultants: None  CAD  - Patient presented to [facility] ED on 1/9 after she had a cardiac  PET scan- after the study, she had developed persistent chest pain, palpitations, and dyspnea. PET scan suggested severe multivessel CAD  - In the ED, symptoms resolved. hsTn 17>20. EKG showed lateral T wave inversions  - Given symptoms after PET and PET scan suggestive of severe multivessel disease, patient underwent cardiac catheterization on 1/10 that showed multivessel CAD with 60% ostial narrowing of the second diagonal branch of the LAD; 20% proximal circumflex stenosis with mid circumflex bifurcation stenosis of 80 and 90%; dominant RCA with 40% proximal stenosis, mild 10 and 20% mid stenoses, and 30 and 70% mid and distal PDA stenosis. EF 55-60%.  - Recommended PCI, but patient had multiple metal allergies. PCI deferred to allow for time to discuss with patient's allergist, review with stent representatives, and discuss with family  - Dr. Johnsie Cancel and Dr. Burt Knack discussed, also reached out to patient's allergist. Determined that Synergy stent with platinum would likely not trigger allergic reaction as there is minimal leaching of any metals with DES - Patient was taken back to the cath lab on 1/11. Underwent PTCA/DES x1 of the OM1 with Synergy Stent. Also underwent Successful balloon angioplasty of the mid circumflex/OM2  - Started on DAPT with ASA, plavix. Needs to be on DAPT for at least 6 months  - Continue carvedilol 12.5 mg BID (new this admission)  - Continue crestor 20 mg daily   HLD  - Lipid panel from 03/2021 showed cholesterol 222, LDL 155. She was started on crestor 20 mg daily at that time  - Lipid panel was not drawn this admission. To prevent delaying discharge, I will not order today. Patient has a follow up appointment on 1/26 and should have lipid panel drawn at that time  HTN  - BP was elevated on admission. Started carvedilol 6.25 mg BID on 1/10, then increased to 12.5 mg BID on 1/12.   - Continue carvedilol 12.5 mg BID at discharge  - Can further titrate BP medications as an  outpatient    Patient was seen and examined by Dr. Johnsie Cancel and was deemed stable for discharge.   Patient has a follow up appointment on 1/26   Did the patient have an acute coronary syndrome (MI, NSTEMI, STEMI, etc) this admission?:  No                               Did the patient have a percutaneous coronary intervention (stent / angioplasty)?:  Yes.     Cath/PCI Registry Performance & Quality Measures: Aspirin prescribed? - Yes ADP Receptor Inhibitor (Plavix/Clopidogrel, Brilinta/Ticagrelor or Effient/Prasugrel) prescribed (includes medically managed patients)? - Yes High Intensity Statin (Lipitor 40-'80mg'$  or Crestor 20-'40mg'$ ) prescribed? - Yes For EF <40%, was ACEI/ARB prescribed? - Not Applicable (EF >/= 01%) For EF <40%, Aldosterone Antagonist (Spironolactone or Eplerenone) prescribed? - Not Applicable (EF >/= 74%) Cardiac Rehab Phase II ordered? - Yes     _____________  Discharge Vitals Blood pressure (!) 162/72, pulse 89, temperature 98.7 F (37.1  C), temperature source Oral, resp. rate 18, height '4\' 11"'$  (1.499 m), weight 102.3 kg, last menstrual period 08/16/1992, SpO2 98 %.  Filed Weights   01/25/22 1936 01/26/22 0536  Weight: 102.2 kg 102.3 kg    Labs & Radiologic Studies    CBC Recent Labs    01/24/22 1715 01/25/22 2013 01/26/22 0202 01/27/22 0252  WBC 7.3   < > 9.6 8.0  NEUTROABS 4.6  --   --   --   HGB 14.4   < > 13.8 12.6  HCT 42.9   < > 41.2 37.1  MCV 88.8   < > 88.6 88.1  PLT 226   < > 220 203   < > = values in this interval not displayed.   Basic Metabolic Panel Recent Labs    01/26/22 0202 01/27/22 0252  NA 138 137  K 3.7 3.5  CL 107 108  CO2 24 20*  GLUCOSE 121* 98  BUN 18 13  CREATININE 0.62 0.69  CALCIUM 8.3* 7.8*   Liver Function Tests Recent Labs    01/24/22 1715  AST 18  ALT 17  ALKPHOS 52  BILITOT 0.4  PROT 7.0  ALBUMIN 3.8   No results for input(s): "LIPASE", "AMYLASE" in the last 72 hours. High Sensitivity Troponin:    Recent Labs  Lab 01/24/22 1715 01/24/22 2014  TROPONINIHS 17 20*    BNP Invalid input(s): "POCBNP" D-Dimer No results for input(s): "DDIMER" in the last 72 hours. Hemoglobin A1C No results for input(s): "HGBA1C" in the last 72 hours. Fasting Lipid Panel No results for input(s): "CHOL", "HDL", "LDLCALC", "TRIG", "CHOLHDL", "LDLDIRECT" in the last 72 hours. Thyroid Function Tests No results for input(s): "TSH", "T4TOTAL", "T3FREE", "THYROIDAB" in the last 72 hours.  Invalid input(s): "FREET3" _____________  CARDIAC CATHETERIZATION  Result Date: 01/26/2022   Mid Cx lesion is 80% stenosed.   Prox Cx lesion is 20% stenosed.   2nd Diag lesion is 60% stenosed.   3rd Mrg lesion is 90% stenosed.   A drug-eluting stent was successfully placed using a SYNERGY XD 2.25X20.   Balloon angioplasty was performed using a BALLN SAPPHIRE 2.0X12.   Post intervention, there is a 0% residual stenosis.   Post intervention, there is a 60% residual stenosis. Severe stenosis in the mid Circumflex affecting flow into the first obtuse marginal branch and the second obtuse marginal branch. Successful PTCA/DES x 1 OM1 (Synergy stent platform as felt to be the most favorable given her metal allergy profile) Successful balloon angioplasty mid Circumflex/OM2 Recommendations: DAPT with ASA and Plavix for at least six months.   CARDIAC CATHETERIZATION  Result Date: 01/25/2022   2nd Diag lesion is 60% stenosed.   Mid Cx lesion is 80% stenosed.   3rd Mrg lesion is 90% stenosed.   Prox Cx lesion is 20% stenosed.   Prox RCA lesion is 40% stenosed.   Mid RCA lesion is 10% stenosed.   Dist RCA lesion is 20% stenosed.   RPDA-1 lesion is 30% stenosed.   RPDA-2 lesion is 70% stenosed.   LV end diastolic pressure is severely elevated. Multivessel CAD with 60% ostial narrowing of the second diagonal branch of the LAD; 20% proximal circumflex stenosis with mid circumflex bifurcation stenosis of 80 and 90%; dominant RCA with 40% proximal  stenosis, mild 10 and 20% mid stenoses, and 30 and 70% mid and distal PDA stenosis. Serve global LV contractility with EF estimated 55 to 60%.  Elevated LVEDP at 30 mm. Significant blood pressure elevation  for which the patient received hydralazine 10 mg x 2 post procedure. RECOMMENDATION: Patient most likely will need PCI for her bifurcation circumflex stenosis.  She was very concerned about potential metal allergies.  We reviewed the stents with her and her concern with the molybdenum which is present in both the Synergy and Medtronic stents.  The Synergy stent also has platinum chromium in the Resolute stent platinum iridium in addition to cobalt alloy inner core.  Will discuss with his representatives.  Plan intervention tomorrow once further discussion obtained with patient family and device reps.   NM PET CT CARDIAC PERFUSION MULTI W/ABSOLUTE BLOODFLOW  Result Date: 01/24/2022   Findings suggest severe multivessel CAD.  While perfusion images only show inferolateral ischemia, there are multiple findings suggestive of severe multivessel CAD, including drop in EF with stress, marked TID (1.45), diffuse ST depressions following lexiscan, severe coronary calcifications on CT, and decreased flow reserve globally and in each coronary distribution.  Cardiac catherization recommended.   Diffuse horizontal ST depressions during recovery   LV perfusion is abnormal. There is evidence of ischemia. Defect 1: There is a small defect with mild reduction in uptake present in the mid to basal inferolateral location(s) that is reversible. There is normal wall motion in the defect area. Consistent with ischemia.   Rest left ventricular function is normal. Rest EF: 63 %. Stress left ventricular function is abnormal. Stress global function is mildly reduced. Stress EF: 50 %. End diastolic cavity size is normal. End systolic cavity size is normal.   Myocardial blood flow was computed to be 1.49m/g/min at rest and 1.751mg/min  at stress. Global myocardial blood flow reserve was 1.70 and was abnormal.   Coronary calcium was present on the attenuation correction CT images. Severe coronary calcifications were present. Coronary calcifications were present in the left anterior descending artery, left circumflex artery and right coronary artery distribution(s).   Findings are consistent with ischemia. The study is high risk.   Electronically signed by ChOswaldo MilianMD CLINICAL DATA:  This over-read does not include interpretation of cardiac or coronary anatomy or pathology. The cardiac PET-CT interpretation by the cardiologist is attached. COMPARISON:  None Available. FINDINGS: Vascular: Atherosclerotic calcifications involving the aorta and coronary arteries. No ascending thoracic aortic aneurysm. Mediastinum/Nodes: No mediastinal or hilar mass or adenopathy. Small scattered lymph nodes are noted. The esophagus is grossly normal. Lungs/Pleura: Mosaic pattern of ground-glass attenuation in the lungs new since prior cardiac CT. This can be seen with asymmetric pulmonary edema, small airways disease such as asthma or constrictive bronchiolitis. Other possibilities would include cryptogenic organizing pneumonia or hypersensitivity pneumonitis. No pulmonary lesions. No pleural effusions. Upper Abdomen: No significant upper abdominal findings. Moderate vascular calcifications. Musculoskeletal: No breast masses or bone lesions. IMPRESSION: 1. Mosaic pattern of ground-glass attenuation in the lungs new since prior cardiac CT. This can be seen with asymmetric pulmonary edema, small airways disease such as asthma or constrictive bronchiolitis. Other possibilities would include cryptogenic organizing pneumonia or hypersensitivity pneumonitis. 2. No mediastinal or hilar mass or adenopathy. 3. Aortic and coronary artery calcifications. Electronically Signed   By: P.Marijo Sanes.D.   On: 01/24/2022 16:37  Disposition   Pt is being discharged  home today in good condition.  Follow-up Plans & Appointments     Follow-up Information     DiBarbarann EhlersrJunius Creamer NP Follow up on 02/10/2022.   Specialty: Cardiology Why: Appointment at 8:25 AM Contact information: [phone]  Discharge Instructions     Amb Referral to Cardiac Rehabilitation   Complete by: As directed    Diagnosis: Coronary Stents   After initial evaluation and assessments completed: Virtual Based Care may be provided alone or in conjunction with Phase 2 Cardiac Rehab based on patient barriers.: Yes   Intensive Cardiac Rehabilitation (ICR) [place] location only OR Traditional Cardiac Rehabilitation (TCR) *If criteria for ICR are not met will enroll in TCR [facility] only): Yes   Diet - low sodium heart healthy   Complete by: As directed    Discharge instructions   Complete by: As directed    PLEASE DO NOT MISS ANY DOSES OF YOUR PLAVIX!!!!! Also keep a log of you blood pressures and bring back to your follow up appt. Please call the office with any questions.   Patients taking blood thinners should generally stay away from medicines like ibuprofen, Advil, Motrin, naproxen, and Aleve due to risk of stomach bleeding. You may take Tylenol as directed or talk to your primary doctor about alternatives.  PLEASE ENSURE THAT YOU DO NOT RUN OUT OF YOUR PLAVIX. This medication is very important to remain on for at least 57month. IF you have issues obtaining this medication due to cost please CALL the office 3-5 business days prior to running out in order to prevent missing doses of this medication.   Radial Site Care Refer to this sheet in the next few weeks. These instructions provide you with information on caring for yourself after your procedure. Your caregiver may also give you more specific instructions. Your treatment has been planned according to current medical practices, but problems sometimes occur.  Call your caregiver if you have any problems or questions after your procedure. HOME CARE INSTRUCTIONS You may shower the day after the procedure. Remove the bandage (dressing) and gently wash the site with plain soap and water. Gently pat the site dry.  Do not apply powder or lotion to the site.  Do not submerge the affected site in water for 3 to 5 days.  Inspect the site at least twice daily.  Do not flex or bend the affected arm for 24 hours.  No lifting over 5 pounds (2.3 kg) for 5 days after your procedure.  Do not drive home if you are discharged the same day of the procedure. Have someone else drive you.  You may drive 24 hours after the procedure unless otherwise instructed by your caregiver.  What to expect: Any bruising will usually fade within 1 to 2 weeks.  Blood that collects in the tissue (hematoma) may be painful to the touch. It should usually decrease in size and tenderness within 1 to 2 weeks.  SEEK IMMEDIATE MEDICAL CARE IF: You have unusual pain at the radial site.  You have redness, warmth, swelling, or pain at the radial site.  You have drainage (other than a small amount of blood on the dressing).  You have chills.  You have a fever or persistent symptoms for more than 72 hours.  You have a fever and your symptoms suddenly get worse.  Your arm becomes pale, cool, tingly, or numb.  You have heavy bleeding from the site. Hold pressure on the site.   Increase activity slowly   Complete by: As directed         Discharge Medications   Allergies as of 01/27/2022       Reactions   Meloxicam Other (See Comments)   Made patient have stomach pain ,sore throat, joint  pains, face flush    Molds & Smuts Other (See Comments), Palpitations, Shortness Of Breath, Swelling   Cefixime Swelling   Celebrex [celecoxib] Swelling   Cobalt Other (See Comments)   Per allergist   Gabapentin Other (See Comments)   Joint pain   Levofloxacin Other (See Comments)   Muscle and  joint pain   Molybdenum Other (See Comments)   Per allergist   Other Other (See Comments)   TEQUIN. TEQUIN - JOINT AND MUSCLE PAIN Other Reaction: painful joints TEQUIN. TEQUIN - JOINT AND MUSCLE PAIN Tantal Metal   Penicillins Hives   Robaxin [methocarbamol]    Tomato Hives   Adhesive [tape] Rash   Sensitive to EKG leads; sensitive to 61M Micropore tape   Dust Mite Extract Itching, Other (See Comments), Palpitations   Tetracycline Hcl Rash   Can take Doxy   Tetracyclines & Related Rash        Medication List     TAKE these medications    ascorbic acid 250 MG tablet Commonly known as: VITAMIN C Take 250 mg by mouth daily.   aspirin EC 81 MG tablet Take 1 tablet (81 mg total) by mouth daily. Swallow whole.   carboxymethylcellulose 0.5 % Soln Commonly known as: REFRESH PLUS 1 drop 2 (two) times daily as needed (for dry eyes).   carvedilol 12.5 MG tablet Commonly known as: COREG Take 1 tablet (12.5 mg total) by mouth 2 (two) times daily with a meal.   CINNAMON PO Take 2.5 mLs by mouth daily.   clopidogrel 75 MG tablet Commonly known as: PLAVIX Take 1 tablet (75 mg total) by mouth daily. Start taking on: January 28, 2022   Coenzyme Q10 200 MG capsule Take 200 mg by mouth daily.   COLLAGEN PO Take by mouth. Collagen Peptides. 1 scoop   cyanocobalamin 1000 MCG/ML injection Commonly known as: VITAMIN B12 Inject 1,000 mcg into the muscle once. Once a month   cyanocobalamin 1000 MCG tablet Commonly known as: VITAMIN B12 Take 1,000 mcg by mouth every Monday, Wednesday, and Friday.   Fish Oil 500 MG Caps Take 500 mg by mouth daily.   NASAL CLEANSE RINSE MIX NA Place into the nose. Xlear rinse   nitroGLYCERIN 0.4 MG SL tablet Commonly known as: NITROSTAT Place 1 tablet (0.4 mg total) under the tongue every 5 (five) minutes x 3 doses as needed for chest pain.   NON FORMULARY Take 3 fluid ounces by mouth daily. Goat Kiefer   PROBIOTIC ADVANCED PO Take by  mouth. Patient states taking 5 billion, one capsule daily. Suprema Dophilus probiotic   rosuvastatin 20 MG tablet Commonly known as: CRESTOR Take 1 tablet (20 mg total) by mouth daily.   SUPER MULTI-VITAMIN PO Take 1 capsule by mouth daily. Solgar Formula VM-75   TART CHERRY ADVANCED PO Take 2 tablets by mouth daily.   MISC NATURAL PRODUCTS PO Take by mouth. Black currant seed oil   Vitamin D3 125 MCG (5000 UT) Caps Take 5,000 Units by mouth. Every other day   Vitamin K2 100 MCG Tabs Take 180 mcg by mouth. Every 6th day. MK-7   XLEAR SINUS CARE SPRAY NA Place into the nose. 2 sprays at bedtime           Outstanding Labs/Studies   Lipid panel at follow up appointment   Duration of Discharge Encounter   Greater than 30 minutes including physician time.  Signed, Margie Billet, PA-C 01/27/2022, 9:50 AM

## 2022-01-27 NOTE — Progress Notes (Signed)
CARDIAC REHAB PHASE I   Post stent education including site care, exercise guidelines, restrictions, risk factors, antiplatelet therapy importance, heart healthy diet and CRP2 reviewed. All questions and concerns addressed. Will refer to [place] for CRP2. Plan for home today.   [id number]  Vanessa Barbara, RN BSN 01/27/2022 9:42 AM

## 2022-01-31 ENCOUNTER — Telehealth: Payer: Self-pay | Admitting: Pulmonary Disease

## 2022-01-31 NOTE — Telephone Encounter (Signed)
Called Pt to let her know to still come to her appointment on Friday with Dr Halford Chessman at [facility]. I also let her know that he will review her Cadiac testing then. Nothing further needed.

## 2022-02-03 ENCOUNTER — Ambulatory Visit (INDEPENDENT_AMBULATORY_CARE_PROVIDER_SITE_OTHER): Payer: Medicare Other | Admitting: Pulmonary Disease

## 2022-02-03 ENCOUNTER — Ambulatory Visit (INDEPENDENT_AMBULATORY_CARE_PROVIDER_SITE_OTHER): Payer: Medicare Other

## 2022-02-03 ENCOUNTER — Telehealth (HOSPITAL_COMMUNITY): Payer: Self-pay

## 2022-02-03 ENCOUNTER — Encounter (HOSPITAL_BASED_OUTPATIENT_CLINIC_OR_DEPARTMENT_OTHER): Payer: Self-pay | Admitting: Pulmonary Disease

## 2022-02-03 VITALS — BP 124/66 | HR 82 | Temp 99.1°F | Ht 60.0 in | Wt 227.4 lb

## 2022-02-03 DIAGNOSIS — R059 Cough, unspecified: Secondary | ICD-10-CM | POA: Diagnosis not present

## 2022-02-03 DIAGNOSIS — R0609 Other forms of dyspnea: Secondary | ICD-10-CM

## 2022-02-03 DIAGNOSIS — R0602 Shortness of breath: Secondary | ICD-10-CM | POA: Diagnosis not present

## 2022-02-03 DIAGNOSIS — G4733 Obstructive sleep apnea (adult) (pediatric): Secondary | ICD-10-CM

## 2022-02-03 NOTE — Patient Instructions (Signed)
Chest xray and lab tests today  Follow up in 6 to 8 weeks

## 2022-02-03 NOTE — Telephone Encounter (Signed)
Phase II referral for Cardiac Rehab faxed to [facility].

## 2022-02-03 NOTE — Progress Notes (Signed)
[facility]  Chief Complaint  Patient presents with   Follow-up    Pt states she was in hospital last week for a stent and they pulmonary lesions.     Constitutional:  BP 124/66 (BP Location: Right Leg, Patient Position: Sitting, Cuff Size: Large)   Pulse 82   Temp 99.1 F (37.3 C) (Oral)   Ht 5' (1.524 m)   Wt 227 lb 6.4 oz (103.1 kg)   LMP 08/16/1992   SpO2 98%   BMI 44.41 kg/m   Past Medical History:  Allergies, GERD, HTN, Nephrolithiasis, Chronic pain  Past Surgical History:  She  has a past surgical history that includes Umbilical hernia repair (2010); Carpal tunnel release (1998); dental implants (2010); Esophageal dilation (2010); Hammer toe surgery; Septoplasty; Rotator cuff repair (Right, 09/2011); Pelvic laparoscopy (1990);  dilatation and curettage (2000); Rotator cuff repair (Left, 11/11/2013); Cataract extraction (Bilateral); Dental surgery; Carpal tunnel release (Left, 05/21/2020); Dilatation & curettage/hysteroscopy with myosure (N/A, 08/22/2021); Operative ultrasound (N/A, 08/22/2021); LEFT HEART CATH AND CORONARY ANGIOGRAPHY (N/A, 01/25/2022); and CORONARY STENT INTERVENTION (N/A, 01/26/2022).  Brief Summary:  Kerry [name] is a 72 y.o. female with obstructive sleep apnea.      Subjective:   She is here with her husband.  She developed chest pain.  Went to hospital had cardiac cath and then stenting.    Cardiac CT showed scattered nodules.  Cardiac PET scan showed mosaic GGO in lungs.  She was assessed for seronegative rheumatoid arthritis several years ago.  Her sister has rheumatoid arthritis.  Her husband had COVID in September 2023.  She developed a respiratory infection then also.  Home COVID tests were negative.  She developed vertigo and diplopia.  She had a lesion in her mouth and she had terrible sinus pressure.  Since then she has a cough with clear sputum.  She gets winded with any amount of  activity.  Okay at rest.  Uses CPAP nightly.  Has trouble with mask causing pressure on her cheeks.    Physical Exam:   Appearance - well kempt   ENMT - no sinus tenderness, no oral exudate, no LAN, Mallampati 3 airway, no stridor, wears dentures  Respiratory - equal breath sounds bilaterally, no wheezing or rales  CV - s1s2 regular rate and rhythm, no murmurs  Ext - no clubbing, no edema  Skin - no rashes  Psych - normal mood and affect     Pulmonary Tests:    Chest Imaging:  Cardiac CT 12/27/21 >> small scattered nodules up to 6 mm Cardiac PET 01/24/22 >> mosaic pattern of GGO  Sleep Tests:  HST 03/04/15 >> AHI 21.6, SpO2 low 80% Auto CPAP 01/03/22 to 02/01/22 >> used on 27 of 30 nights with average 5 hrs 24 min.  Average AHI 0.5 with median CPAP 7 and 95 th percentile CPAP 9 cm H2O  Cardiac Tests:  Echo 04/25/19 >> EF 60 to 65%, mild LVH, grade 2 DD  Social History:  She  reports that she has never smoked. She has never used smokeless tobacco. She reports that she does not drink alcohol and does not use drugs.  Family History:  Her family history includes Arthritis in her sister; Crohn's disease in her brother; Diabetes in her mother; Glaucoma in her brother; Heart attack in her father; Kidney failure in her brother; Ovarian cancer (age of onset: 36) in her sister; Rheumatologic disease in her mother and sister; Ulcerative colitis in her brother.  Assessment/Plan:   Dyspnea, chronic cough, and abnormal CT chest. - she likely had COVID infection in September 2023 - there was previous concern for rheumatoid arthritis and she has family history of rheumatoid arthritis - chest xray today - check ESR, ANA, RF, HP, ANCA  Obstructive sleep apnea. - she is compliant with CPAP and reports benefit from therapy - she uses [facility] for her DME - current CPAP ordered March 2022 - continue auto CPAP 5 to 15 cm H2O - discussed techniques to improve mask  fit  Obesity. - discussed how her weight is contributing to her health issues, including her sleep apnea  Coronary artery disease s/p stenting. - followed by Dr. [name] with cardiology  Time Spent Involved in Patient Care on Day of Examination:  37 minutes  Follow up:   Patient Instructions  Chest xray and lab tests today  Follow up in 6 to 8 weeks  Medication List:   Allergies as of 02/03/2022       Reactions   Meloxicam Other (See Comments)   Made patient have stomach pain ,sore throat, joint pains, face flush    Molds & Smuts Other (See Comments), Palpitations, Shortness Of Breath, Swelling   Cefixime Swelling   Celebrex [celecoxib] Swelling   Cobalt Other (See Comments)   Per allergist   Gabapentin Other (See Comments)   Joint pain   Levofloxacin Other (See Comments)   Muscle and joint pain   Molybdenum Other (See Comments)   Per allergist   Other Other (See Comments)   TEQUIN. TEQUIN - JOINT AND MUSCLE PAIN Other Reaction: painful joints TEQUIN. TEQUIN - JOINT AND MUSCLE PAIN Tantal Metal   Penicillins Hives   Robaxin [methocarbamol]    Tomato Hives   Adhesive [tape] Rash   Sensitive to EKG leads; sensitive to 21M Micropore tape   Dust Mite Extract Itching, Other (See Comments), Palpitations   Tetracycline Hcl Rash   Can take Doxy   Tetracyclines & Related Rash        Medication List        Accurate as of February 03, 2022  3:46 PM. If you have any questions, ask your nurse or doctor.          ascorbic acid 250 MG tablet Commonly known as: VITAMIN C Take 250 mg by mouth daily.   aspirin EC 81 MG tablet Take 1 tablet (81 mg total) by mouth daily. Swallow whole.   carboxymethylcellulose 0.5 % Soln Commonly known as: REFRESH PLUS 1 drop 2 (two) times daily as needed (for dry eyes).   carvedilol 12.5 MG tablet Commonly known as: COREG Take 1 tablet (12.5 mg total) by mouth 2 (two) times daily with a meal.   CINNAMON PO Take 2.5 mLs  by mouth daily.   clopidogrel 75 MG tablet Commonly known as: PLAVIX Take 1 tablet (75 mg total) by mouth daily.   Coenzyme Q10 200 MG capsule Take 200 mg by mouth daily.   COLLAGEN PO Take by mouth. Collagen Peptides. 1 scoop   cyanocobalamin 1000 MCG/ML injection Commonly known as: VITAMIN B12 Inject 1,000 mcg into the muscle once. Once a month   cyanocobalamin 1000 MCG tablet Commonly known as: VITAMIN B12 Take 1,000 mcg by mouth every Monday, Wednesday, and Friday.   Fish Oil 500 MG Caps Take 500 mg by mouth daily.   NASAL CLEANSE RINSE MIX NA Place into the nose. Xlear rinse   nitroGLYCERIN 0.4 MG SL tablet Commonly known as:  NITROSTAT Place 1 tablet (0.4 mg total) under the tongue every 5 (five) minutes x 3 doses as needed for chest pain.   NON FORMULARY Take 3 fluid ounces by mouth daily. Goat Kiefer   PROBIOTIC ADVANCED PO Take by mouth. Patient states taking 5 billion, one capsule daily. Suprema Dophilus probiotic   rosuvastatin 20 MG tablet Commonly known as: CRESTOR Take 1 tablet (20 mg total) by mouth daily.   SUPER MULTI-VITAMIN PO Take 1 capsule by mouth daily. Solgar Formula VM-75   TART CHERRY ADVANCED PO Take 2 tablets by mouth daily.   MISC NATURAL PRODUCTS PO Take by mouth. Black currant seed oil   Vitamin D3 125 MCG (5000 UT) Caps Take 5,000 Units by mouth. Every other day   Vitamin K2 100 MCG Tabs Take 180 mcg by mouth. Every 6th day. MK-7   XLEAR SINUS CARE SPRAY NA Place into the nose. 2 sprays at bedtime        Signature:  Chesley Mires, MD [place] Pager - [phone] 02/03/2022, 3:46 PM

## 2022-02-08 ENCOUNTER — Ambulatory Visit: Payer: Medicare Other | Admitting: Neurology

## 2022-02-09 ENCOUNTER — Encounter (HOSPITAL_BASED_OUTPATIENT_CLINIC_OR_DEPARTMENT_OTHER): Payer: Self-pay | Admitting: Pulmonary Disease

## 2022-02-09 ENCOUNTER — Encounter: Payer: Self-pay | Admitting: Internal Medicine

## 2022-02-09 NOTE — Progress Notes (Signed)
[facility] [place] [facility] [place] Phone: [phone] Subjective:   [name], am serving as a scribe for Dr. Hulan Saas.  I'm seeing this patient by the request  of:  Street, Kerry Mt, MD  CC: Back pain, neck pain, knee pain  [id number]  Kerry [name] is a 72 y.o. female coming in with complaint of back and neck pain. OMT 01/11/2022. Coronary stent placed since last visit. Patient states that she had increase in her back pain during her procedures. R side lumbar spine is tight and painful due to lying on table for procedures with R leg full extended. Pain also increased in shoulders. Had atlas adjustment last week.   Saw cardio today. Going to start cardiac rehab but has to be able to walk 550f in 6 mins. Unable to do so with pain in R knee.   Medications patient has been prescribed: None  Taking:         Reviewed prior external information including notes and imaging from previsou exam, outside providers and external EMR if available.   As well as notes that were available from care everywhere and other healthcare systems.  Past medical history, social, surgical and family history all reviewed in electronic medical record.  No pertanent information unless stated regarding to the chief complaint.   Past Medical History:  Diagnosis Date   Abnormal uterine bleeding    Allergic rhinitis, cause unspecified    Arthritis of knee, right    Bursitis of left shoulder    Cherry hemangioma 07/28/15   vulva   Chronic pain    Dyspareunia    Elevated cholesterol    Elevated uric acid in blood 2021   Fibroid    Foot fracture, left 01/2015   and torn tendons   GERD (gastroesophageal reflux disease)    HTN (hypertension)    Kidney stones    Sleep apnea    Urinary incontinence     Allergies  Allergen Reactions   Meloxicam Other (See Comments)    Made patient have stomach pain ,sore throat, joint pains,  face flush    Molds & Smuts Other (See Comments), Palpitations, Shortness Of Breath and Swelling   Cefixime Swelling   Celebrex [Celecoxib] Swelling   Cobalt Other (See Comments)    Per allergist   Gabapentin Other (See Comments)    Joint pain   Levofloxacin Other (See Comments)    Muscle and joint pain   Molybdenum Other (See Comments)    Per allergist   Other Other (See Comments)    TEQUIN. TEQUIN - JOINT AND MUSCLE PAIN Other Reaction: painful joints TEQUIN. TEQUIN - JOINT AND MUSCLE PAIN Tantal Metal   Penicillins Hives   Robaxin [Methocarbamol]    Tomato Hives   Adhesive [Tape] Rash    Sensitive to EKG leads; sensitive to 50M Micropore tape   Dust Mite Extract Itching, Other (See Comments) and Palpitations   Tetracycline Hcl Rash    Can take Doxy   Tetracyclines & Related Rash     Review of Systems:  No headache, visual changes, nausea, vomiting, diarrhea, constipation, dizziness, abdominal pain, skin rash, fevers, chills, night sweats, weight loss, swollen lymph nodes, body aches, joint swelling, chest pain, shortness of breath, mood changes. POSITIVE muscle aches  Objective  Blood pressure 124/86, pulse 83, height 5' (1.524 m), weight 228 lb (103.4 kg), last menstrual period 08/16/1992, SpO2 98 %.   General: No apparent distress alert and  oriented x3 mood and affect normal, dressed appropriately.  HEENT: Pupils equal, extraocular movements intact  Respiratory: Patient's speak in full sentences and does not appear short of breath  Cardiovascular: No lower extremity edema, non tender, no erythema  Back exam does have some loss of lordosis.  Poor core strength noted.  No tenderness to palpation diffusely. Patient's right knee does have arthritic changes noted.  Instability of the knee noted.  Patient does have trace effusion noted as well with a severely antalgic gait    Osteopathic findings  C3 flexed rotated and side bent right C6 flexed rotated and side bent  left T5 extended rotated and side bent right inhaled rib T8 extended rotated and side bent left L3 flexed rotated and side bent left  Sacrum right on right     Assessment and Plan:  Degenerative cervical disc Degenerative disc disease.  Discussed posture and ergonomics.  Discussed home exercises and icing regimen.    Arthritis of knee, degenerative Patient with the instability of the knee did not feel that the OA stability brace was significantly beneficial so patient was given a hinged brace today.  Will see if she wears that on a more regular basis.  Follow-up with me again in 6 weeks and can consider injection if needed.    Nonallopathic problems  Decision today to treat with OMT was based on Physical Exam  After verbal consent patient was treated with , ME, FPR techniques in cervical, rib, thoracic, lumbar, and sacral  areas  Patient tolerated the procedure well with improvement in symptoms  Patient given exercises, stretches and lifestyle modifications  See medications in patient instructions if given  Patient will follow up in 4-8 weeks    The above documentation has been reviewed and is accurate and complete [name], DO          Note: This dictation was prepared with Dragon dictation along with smaller phrase technology. Any transcriptional errors that result from this process are unintentional.

## 2022-02-09 NOTE — Progress Notes (Signed)
Office Visit    Patient Name: Kerry [name] Date of Encounter: 02/09/2022  Primary Care Provider:  Street, [name], MD Primary Cardiologist:  Dorris Carnes, MD Primary Electrophysiologist: None  Chief Complaint    Kerry [name] is a 72 y.o. female with PMH of CAD s/p LHC with multivessel CAD with DES placed to mid circumflex x 1 and balloon angioplasty of OM 2, HTN, HLD, GERD, OSA who presents today for post left heart catheterization follow-up.  Past Medical History    Past Medical History:  Diagnosis Date   Abnormal uterine bleeding    Allergic rhinitis, cause unspecified    Arthritis of knee, right    Bursitis of left shoulder    Cherry hemangioma 07/28/15   vulva   Chronic pain    Dyspareunia    Elevated cholesterol    Elevated uric acid in blood 2021   Fibroid    Foot fracture, left 01/2015   and torn tendons   GERD (gastroesophageal reflux disease)    HTN (hypertension)    Kidney stones    Sleep apnea    Urinary incontinence    Past Surgical History:  Procedure Laterality Date    dilatation and curettage  2000   CARPAL TUNNEL RELEASE  1998   CARPAL TUNNEL RELEASE Left 05/21/2020   CATARACT EXTRACTION Bilateral    CORONARY STENT INTERVENTION N/A 01/26/2022   Procedure: CORONARY STENT INTERVENTION;  Surgeon: Burnell Blanks, MD;  Location: [facility];  Service: Cardiovascular;  Laterality: N/A;   dental implants  2010   DENTAL SURGERY     DILATATION & CURETTAGE/HYSTEROSCOPY WITH MYOSURE N/A 08/22/2021   Procedure: DILATATION & CURETTAGE/HYSTEROSCOPY WITH MYOSURE RESECTION OF ENDOMETRIAL MASS;  Surgeon: Nunzio Cobbs, MD;  Location: [facility];  Service: Gynecology;  Laterality: N/A;   ESOPHAGEAL DILATION  2010   HAMMER TOE SURGERY     rt   LEFT HEART CATH AND CORONARY ANGIOGRAPHY N/A 01/25/2022   Procedure: LEFT HEART CATH AND CORONARY ANGIOGRAPHY;  Surgeon: Troy Sine, MD;  Location: [facility];  Service: Cardiovascular;  Laterality: N/A;   OPERATIVE ULTRASOUND N/A 08/22/2021   Procedure: OPERATIVE ULTRASOUND GUIDED HYSTEROSCOPY;  Surgeon: Nunzio Cobbs, MD;  Location: [facility];  Service: Gynecology;  Laterality: N/A;   PELVIC LAPAROSCOPY  1990   ROTATOR CUFF REPAIR Right 09/2011   Dr. Tamera Punt   ROTATOR CUFF REPAIR Left 11/11/2013   Dr. Tamera Punt    SEPTOPLASTY     UMBILICAL HERNIA REPAIR  2010   Patient reports mesh was used for the repair.    Allergies  Allergies  Allergen Reactions   Meloxicam Other (See Comments)    Made patient have stomach pain ,sore throat, joint pains, face flush    Molds & Smuts Other (See Comments), Palpitations, Shortness Of Breath and Swelling   Cefixime Swelling   Celebrex [Celecoxib] Swelling   Cobalt Other (See Comments)    Per allergist   Gabapentin Other (See Comments)    Joint pain   Levofloxacin Other (See Comments)    Muscle and joint pain   Molybdenum Other (See Comments)    Per allergist   Other Other (See Comments)    TEQUIN. TEQUIN - JOINT AND MUSCLE PAIN Other Reaction: painful joints TEQUIN. TEQUIN - JOINT AND MUSCLE PAIN Tantal Metal   Penicillins Hives   Robaxin [Methocarbamol]    Tomato Hives   Adhesive [Tape]  Rash    Sensitive to EKG leads; sensitive to 26M Micropore tape   Dust Mite Extract Itching, Other (See Comments) and Palpitations   Tetracycline Hcl Rash    Can take Doxy   Tetracyclines & Related Rash    History of Present Illness    Kerry [name]  is a 72 year old female with the above mention past medical history who presents today for post left heart catheterization follow-up.  Kerry [name] has been followed by our practice since 2018 when she was seen by Dr. Agustin Cree.  She was seen initially for lower extremity swelling and 2D echo was completed showing EF of 65-70%, with normal LVEF and diastolic dysfunction no valvular abnormalities.  She was lost  to follow-up until being seen by Dr. Harrington Challenger in 03/2021 for complaint of presyncope.  She was sent for calcium score that revealed score of 1051 and plaque in multiple coronary arteries.  She was started on statin and ASA 81 mg with recommendation to complete a PET CT to evaluate her heart perfusion.  Following her stress test patient developed dyspnea and palpitations with chest tightness.  She was referred to [facility] long ED for evaluation and EKG was completed that showed lateral T wave inversion..  Results of her PET/CT showed high risk study with multiple perfusion defects.  It was recommended that she have LHC and patient was performed by Dr. Claiborne Billings on 01/25/2022 that revealed multivessel CAD with recommendation for PCI.  She was set up for staged procedure due to concern of possible reaction of stent due to patient's multiple medical allergies.  Consultation was completed with Dr. Johnsie Cancel, Dr. Burt Knack, and patient's allergist with decision to proceed due to Synergy stent being made of platinum.  She underwent successful staged PCI with DES placed to OM1 and balloon angioplasty of circumflex/OM 2.  She was placed on DAPT with ASA and Plavix for 6 months.  She was started on carvedilol due to elevated blood pressures during her admission.  Kerry [name] presents today for post left heart catheterization follow-up with her husband.  Since last being seen in the office patient reports that since her visit she has done well but has had some occasional shortness of breath as well as generalized bodyaches.  Her blood pressure today was well-controlled at 130/68 and heart rate was 69 bpm.  She denies any chest pain similar to her previous complaint prior to her PCI.  She is planning to start physical therapy and has completed her initial consult at [facility] physical therapy.  She is compliant with her current medication regimen and denies any adverse reactions.  She has had a multitude of complaints that I recommended that she  discuss with her PCP.  In regards to her cardiac health she is doing much better.  Patient denies chest pain, palpitations, dyspnea, PND, orthopnea, nausea, vomiting, dizziness, syncope, edema, weight gain, or early satiety.  Home Medications    Current Outpatient Medications  Medication Sig Dispense Refill   ascorbic acid (VITAMIN C) 250 MG tablet Take 250 mg by mouth daily.     aspirin EC 81 MG tablet Take 1 tablet (81 mg total) by mouth daily. Swallow whole. 100 tablet 3   carboxymethylcellulose (REFRESH PLUS) 0.5 % SOLN 1 drop 2 (two) times daily as needed (for dry eyes).     carvedilol (COREG) 12.5 MG tablet Take 1 tablet (12.5 mg total) by mouth 2 (two) times daily with a meal. 180 tablet 3   Cholecalciferol (VITAMIN  D3) 5000 units CAPS Take 5,000 Units by mouth. Every other day     CINNAMON PO Take 2.5 mLs by mouth daily.     clopidogrel (PLAVIX) 75 MG tablet Take 1 tablet (75 mg total) by mouth daily. 90 tablet 3   Coenzyme Q10 200 MG capsule Take 200 mg by mouth daily.     COLLAGEN PO Take by mouth. Collagen Peptides. 1 scoop     cyanocobalamin (VITAMIN B12) 1000 MCG tablet Take 1,000 mcg by mouth every Monday, Wednesday, and Friday.     cyanocobalamin (VITAMIN B12) 1000 MCG/ML injection Inject 1,000 mcg into the muscle once. Once a month     Menatetrenone (VITAMIN K2) 100 MCG TABS Take 180 mcg by mouth. Every 6th day. MK-7     Misc Natural Product Nasal (NASAL CLEANSE RINSE MIX NA) Place into the nose. Xlear rinse     Misc Natural Products (TART CHERRY ADVANCED PO) Take 2 tablets by mouth daily.     MISC NATURAL PRODUCTS PO Take by mouth. Black currant seed oil     Multiple Vitamins-Minerals (SUPER MULTI-VITAMIN PO) Take 1 capsule by mouth daily. Solgar Formula VM-75     nitroGLYCERIN (NITROSTAT) 0.4 MG SL tablet Place 1 tablet (0.4 mg total) under the tongue every 5 (five) minutes x 3 doses as needed for chest pain. 25 tablet 1   NON FORMULARY Take 3 fluid ounces by mouth daily.  Goat Kiefer     Omega-3 Fatty Acids (FISH OIL) 500 MG CAPS Take 500 mg by mouth daily.     Probiotic Product (PROBIOTIC ADVANCED PO) Take by mouth. Patient states taking 5 billion, one capsule daily. Suprema Dophilus probiotic     rosuvastatin (CRESTOR) 20 MG tablet Take 1 tablet (20 mg total) by mouth daily. 90 tablet 3   Sodium Chloride-Xylitol (XLEAR SINUS CARE SPRAY NA) Place into the nose. 2 sprays at bedtime     No current facility-administered medications for this visit.     Review of Systems  Please see the history of present illness.    (+) Right wrist discomfort with ecchymosis (+) Occasional twinges of chest pain  All other systems reviewed and are otherwise negative except as noted above.  Physical Exam    Wt Readings from Last 3 Encounters:  02/03/22 227 lb 6.4 oz (103.1 kg)  01/26/22 225 lb 9.6 oz (102.3 kg)  01/11/22 223 lb (101.2 kg)   YS:AYTKZ were no vitals filed for this visit.,There is no height or weight on file to calculate BMI.  Constitutional:      Appearance: Healthy appearance. Not in distress.  Neck:     Vascular: JVD normal.  Pulmonary:     Effort: Pulmonary effort is normal.     Breath sounds: No wheezing. No rales. Diminished in the bases Cardiovascular:     Normal rate. Regular rhythm. Normal S1. Normal S2.      Murmurs: There is no murmur.  Her left radial site has a small soft hematoma that is resolving with some ecchymosis distally to the insertion.  There is no bruit or thrill appreciated at site Edema:    Peripheral edema absent.  Abdominal:     Palpations: Abdomen is soft non tender. There is no hepatomegaly.  Skin:    General: Skin is warm and dry.  Neurological:     General: No focal deficit present.     Mental Status: Alert and oriented to person, place and time.     Cranial Nerves: Cranial  nerves are intact.  EKG/LABS/Other Studies Reviewed    ECG personally reviewed by me today -sinus rhythm with flat T waves in leads V5 and V6  with no acute changes consistent with previous EKG.  Lab Results  Component Value Date   CREATININE 0.69 01/27/2022   BUN 13 01/27/2022   NA 137 01/27/2022   K 3.5 01/27/2022   CL 108 01/27/2022   CO2 20 (L) 01/27/2022   Lab Results  Component Value Date   ALT 17 01/24/2022   AST 18 01/24/2022   ALKPHOS 52 01/24/2022   BILITOT 0.4 01/24/2022   Lab Results  Component Value Date   CHOL 257 (H) 02/09/2011   HDL 46 02/09/2011   LDLCALC 184 (H) 02/09/2011   TRIG 136 02/09/2011   CHOLHDL 5.6 02/09/2011    Lab Results  Component Value Date   HGBA1C 5.9 07/05/2011    Assessment & Plan    1.  Coronary artery disease: -cardiac PET scan- after the study, she had developed persistent chest pain, palpitations, and dyspnea. PET scan suggested severe multivessel CAD  -s/p DES x 1 to OM1 and balloon angioplasty of circumflex/OM 2 -Today patient reports no chest pain but does have occasional twinges of discomfort -She was noted to have a very small hematoma distal to her insertion site on her left radial wrist.  I advised her to reduce her activity for 1 week with that wrist and to contact our office if she notices increased warmth redness or size of hematoma. -She is fine to proceed with cardiac rehab at this time and has completed her initial evaluation.   2.  Essential hypertension: -Patient's blood pressure during hospitalization was elevated and she was started on antihypertensive for better control. -Today patient's blood pressure is well-controlled at 130/68 -Continue continue current antihypertensive regimen  3.  Hyperlipidemia: -Patient's LDL cholesterol was 156 and will have repeat LFTs and lipids in 6 to 8 weeks -She denies any myalgias with current dose. -Continue Crestor 20 mg daily  4.  History of OSA: -Patient reports full compliance with CPAP  Disposition: Follow-up with Dorris Carnes, MD or APP as scheduled    Medication Adjustments/Labs and Tests  Ordered: Current medicines are reviewed at length with the patient today.  Concerns regarding medicines are outlined above.   Signed, Mable Fill, Marissa Nestle, NP 02/09/2022, 8:42 AM [facility]  Note:  This document was prepared using Dragon voice recognition software and may include unintentional dictation errors.

## 2022-02-10 ENCOUNTER — Ambulatory Visit (INDEPENDENT_AMBULATORY_CARE_PROVIDER_SITE_OTHER): Payer: Medicare Other | Admitting: Family Medicine

## 2022-02-10 ENCOUNTER — Ambulatory Visit: Payer: Medicare Other | Attending: Nurse Practitioner | Admitting: Nurse Practitioner

## 2022-02-10 ENCOUNTER — Encounter: Payer: Self-pay | Admitting: Nurse Practitioner

## 2022-02-10 ENCOUNTER — Encounter: Payer: Self-pay | Admitting: Family Medicine

## 2022-02-10 VITALS — BP 124/86 | HR 83 | Ht 60.0 in | Wt 228.0 lb

## 2022-02-10 VITALS — BP 130/68 | HR 69 | Ht 60.0 in | Wt 228.0 lb

## 2022-02-10 DIAGNOSIS — G4733 Obstructive sleep apnea (adult) (pediatric): Secondary | ICD-10-CM | POA: Diagnosis not present

## 2022-02-10 DIAGNOSIS — M9908 Segmental and somatic dysfunction of rib cage: Secondary | ICD-10-CM

## 2022-02-10 DIAGNOSIS — M9903 Segmental and somatic dysfunction of lumbar region: Secondary | ICD-10-CM

## 2022-02-10 DIAGNOSIS — I2510 Atherosclerotic heart disease of native coronary artery without angina pectoris: Secondary | ICD-10-CM

## 2022-02-10 DIAGNOSIS — M9902 Segmental and somatic dysfunction of thoracic region: Secondary | ICD-10-CM

## 2022-02-10 DIAGNOSIS — M5030 Other cervical disc degeneration, unspecified cervical region: Secondary | ICD-10-CM | POA: Diagnosis not present

## 2022-02-10 DIAGNOSIS — M170 Bilateral primary osteoarthritis of knee: Secondary | ICD-10-CM | POA: Diagnosis not present

## 2022-02-10 DIAGNOSIS — M9904 Segmental and somatic dysfunction of sacral region: Secondary | ICD-10-CM

## 2022-02-10 DIAGNOSIS — E785 Hyperlipidemia, unspecified: Secondary | ICD-10-CM | POA: Diagnosis not present

## 2022-02-10 DIAGNOSIS — I10 Essential (primary) hypertension: Secondary | ICD-10-CM

## 2022-02-10 DIAGNOSIS — M9901 Segmental and somatic dysfunction of cervical region: Secondary | ICD-10-CM | POA: Diagnosis not present

## 2022-02-10 LAB — ANCA PROFILE
Anti-MPO Antibodies: <0.2 units (ref 0.0–0.9)
Anti-PR3 Antibodies: <0.2 units (ref 0.0–0.9)
Atypical pANCA: <1:20 {titer}
C-ANCA: <1:20 {titer}
P-ANCA: <1:20 {titer}

## 2022-02-10 LAB — HYPERSENSITIVITY PNEUMONITIS
A. Pullulans Abs: NEGATIVE
A.Fumigatus #1 Abs: NEGATIVE
Micropolyspora faeni, IgG: NEGATIVE
Pigeon Serum Abs: NEGATIVE
Thermoact. Saccharii: NEGATIVE
Thermoactinomyces vulgaris, IgG: NEGATIVE

## 2022-02-10 LAB — ANA W/REFLEX: ANA Titer 1: NEGATIVE

## 2022-02-10 LAB — RHEUMATOID FACTOR: Rheumatoid fact SerPl-aCnc: <10 IU/mL (ref <14.0)

## 2022-02-10 LAB — SEDIMENTATION RATE: Sed Rate: 27 mm/hr (ref 0–40)

## 2022-02-10 NOTE — Assessment & Plan Note (Signed)
Patient with the instability of the knee did not feel that the OA stability brace was significantly beneficial so patient was given a hinged brace today.  Will see if she wears that on a more regular basis.  Follow-up with me again in 6 weeks and can consider injection if needed.

## 2022-02-10 NOTE — Assessment & Plan Note (Addendum)
Degenerative disc disease.  Discussed posture and ergonomics.  Discussed home exercises and icing regimen.

## 2022-02-10 NOTE — Patient Instructions (Signed)
Recumbent elliptical would be great Start rehab See me in 6 weeks

## 2022-02-10 NOTE — Patient Instructions (Signed)
Medication Instructions:  Your physician recommends that you continue on your current medications as directed. Please refer to the Current Medication list given to you today. *If you need a refill on your cardiac medications before your next appointment, please call your pharmacy*  Lab Work: NONE ORDERED   Testing/Procedures: NONE ORDERED  Follow-Up: At [facility], you and your health needs are our priority.  As part of our continuing mission to provide you with exceptional heart care, we have created designated Provider Care Teams.  These Care Teams include your primary Cardiologist (physician) and Advanced Practice Providers (APPs -  Physician Assistants and Nurse Practitioners) who all work together to provide you with the care you need, when you need it.  We recommend signing up for the patient portal called "MyChart".  Sign up information is provided on this After Visit Summary.  MyChart is used to connect with patients for Virtual Visits (Telemedicine).  Patients are able to view lab/test results, encounter notes, upcoming appointments, etc.  Non-urgent messages can be sent to your provider as well.   To learn more about what you can do with MyChart, go to [email or web address].    Your next appointment:   FOLLOW UP AS SCHEDULED   Provider:   Dorris Carnes, MD     Other Instructions REDUCE ACTIVITY WITH LEFT WRIST CONTACT THE OFFICE IF AREA INCREASES, HAS REDNESS OR IS WARM TO THE TOUCH.

## 2022-02-13 ENCOUNTER — Other Ambulatory Visit: Payer: Self-pay

## 2022-02-13 DIAGNOSIS — J849 Interstitial pulmonary disease, unspecified: Secondary | ICD-10-CM

## 2022-02-13 NOTE — Telephone Encounter (Signed)
Please review, pulmonary issue is shortness of breath

## 2022-02-14 ENCOUNTER — Encounter: Payer: Self-pay | Admitting: Family Medicine

## 2022-02-14 NOTE — Telephone Encounter (Signed)
Please let her know I do not have an office location in [place].  It is okay for her to start cardiac rehab.  Need to get CT chest results before addressing other respiratory issues. This has already been ordered.

## 2022-02-21 DIAGNOSIS — I10 Essential (primary) hypertension: Secondary | ICD-10-CM | POA: Diagnosis not present

## 2022-02-21 DIAGNOSIS — G4733 Obstructive sleep apnea (adult) (pediatric): Secondary | ICD-10-CM | POA: Diagnosis not present

## 2022-02-21 DIAGNOSIS — Z8673 Personal history of transient ischemic attack (TIA), and cerebral infarction without residual deficits: Secondary | ICD-10-CM | POA: Diagnosis not present

## 2022-02-21 DIAGNOSIS — Z955 Presence of coronary angioplasty implant and graft: Secondary | ICD-10-CM | POA: Diagnosis not present

## 2022-02-21 DIAGNOSIS — Z7982 Long term (current) use of aspirin: Secondary | ICD-10-CM | POA: Diagnosis not present

## 2022-02-21 DIAGNOSIS — I2510 Atherosclerotic heart disease of native coronary artery without angina pectoris: Secondary | ICD-10-CM | POA: Diagnosis not present

## 2022-02-21 DIAGNOSIS — Z7902 Long term (current) use of antithrombotics/antiplatelets: Secondary | ICD-10-CM | POA: Diagnosis not present

## 2022-02-22 ENCOUNTER — Ambulatory Visit: Payer: Medicare Other | Admitting: Obstetrics and Gynecology

## 2022-02-22 DIAGNOSIS — I10 Essential (primary) hypertension: Secondary | ICD-10-CM | POA: Diagnosis not present

## 2022-02-22 DIAGNOSIS — Z8673 Personal history of transient ischemic attack (TIA), and cerebral infarction without residual deficits: Secondary | ICD-10-CM | POA: Diagnosis not present

## 2022-02-22 DIAGNOSIS — I2510 Atherosclerotic heart disease of native coronary artery without angina pectoris: Secondary | ICD-10-CM | POA: Diagnosis not present

## 2022-02-22 DIAGNOSIS — Z7982 Long term (current) use of aspirin: Secondary | ICD-10-CM | POA: Diagnosis not present

## 2022-02-22 DIAGNOSIS — Z955 Presence of coronary angioplasty implant and graft: Secondary | ICD-10-CM | POA: Diagnosis not present

## 2022-02-22 DIAGNOSIS — G4733 Obstructive sleep apnea (adult) (pediatric): Secondary | ICD-10-CM | POA: Diagnosis not present

## 2022-03-03 ENCOUNTER — Encounter: Payer: Self-pay | Admitting: Family Medicine

## 2022-03-03 NOTE — Telephone Encounter (Signed)
Pt messages also sent to Dr Gardenia Phlegm that has been addressing her knee mobility issues for potential Physical Therapy.

## 2022-03-06 ENCOUNTER — Encounter: Payer: Self-pay | Admitting: *Deleted

## 2022-03-08 ENCOUNTER — Other Ambulatory Visit: Payer: Self-pay

## 2022-03-08 DIAGNOSIS — G8929 Other chronic pain: Secondary | ICD-10-CM

## 2022-03-08 DIAGNOSIS — M542 Cervicalgia: Secondary | ICD-10-CM

## 2022-03-09 ENCOUNTER — Other Ambulatory Visit: Payer: Self-pay

## 2022-03-09 ENCOUNTER — Ambulatory Visit (HOSPITAL_BASED_OUTPATIENT_CLINIC_OR_DEPARTMENT_OTHER)
Admission: RE | Admit: 2022-03-09 | Discharge: 2022-03-09 | Disposition: A | Discharge status: Home / Self Care | Payer: Medicare Other | Source: Ambulatory Visit | Attending: Pulmonary Disease | Admitting: Pulmonary Disease

## 2022-03-09 DIAGNOSIS — M170 Bilateral primary osteoarthritis of knee: Secondary | ICD-10-CM

## 2022-03-09 DIAGNOSIS — J398 Other specified diseases of upper respiratory tract: Secondary | ICD-10-CM | POA: Diagnosis not present

## 2022-03-09 DIAGNOSIS — M5416 Radiculopathy, lumbar region: Secondary | ICD-10-CM

## 2022-03-09 DIAGNOSIS — J849 Interstitial pulmonary disease, unspecified: Secondary | ICD-10-CM | POA: Insufficient documentation

## 2022-03-09 DIAGNOSIS — R918 Other nonspecific abnormal finding of lung field: Secondary | ICD-10-CM | POA: Diagnosis not present

## 2022-03-13 ENCOUNTER — Telehealth: Payer: Self-pay | Admitting: Pulmonary Disease

## 2022-03-13 NOTE — Telephone Encounter (Signed)
Attempted to contact pt.  Please let her know her CT chest shows very mild lung scarring, and this will need close monitoring for progression.  She had collapse of her airways when she breaths out, and this likely contributes to her breathing difficulties.  She should continue using CPAP at night.  She also had air trapping which can sometimes be seen with asthma.  Please send script for advair hfa [phone] two puffs bid.   Will discuss in more detail at her follow up on 03/21/22.   Called and notified patient of results.  She expressed understanding.  She wants to know if any of the results mean that she will need to do any rehab or pulmonary rehab. Also stated that she has a cardiac procedure at the end of January and they advised her not to add on any more medications until she was cleared to do so. She wants to know if the inhaler will still be ok to use.  Please advise, thanks!

## 2022-03-13 NOTE — Telephone Encounter (Signed)
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  Please let her know her CT chest shows very mild lung scarring, and this will need close monitoring for progression.  She had collapse of her airways when she breaths out, and this likely contributes to her breathing difficulties.  She should continue using CPAP at night.  She also had air trapping which can sometimes be seen with asthma.  Please send script for advair hfa [phone] two puffs bid.  Will discuss in more detail at her follow up on 03/21/22.

## 2022-03-13 NOTE — Telephone Encounter (Signed)
She might benefit from rehab, but would need to wait until after her procedure with cardiology.  Also can hold off on starting advair until after she has cardiac procedure.

## 2022-03-14 NOTE — Telephone Encounter (Signed)
ATC patient  Left detailed vm (ok per dpr) on [phone] with response from Dr. Halford Chessman.  Nothing further needed

## 2022-03-20 ENCOUNTER — Other Ambulatory Visit: Payer: Medicare Other

## 2022-03-21 ENCOUNTER — Ambulatory Visit (INDEPENDENT_AMBULATORY_CARE_PROVIDER_SITE_OTHER): Payer: Medicare Other | Admitting: Pulmonary Disease

## 2022-03-21 ENCOUNTER — Encounter (HOSPITAL_BASED_OUTPATIENT_CLINIC_OR_DEPARTMENT_OTHER): Payer: Self-pay | Admitting: Pulmonary Disease

## 2022-03-21 ENCOUNTER — Ambulatory Visit: Payer: Medicare Other | Attending: Internal Medicine

## 2022-03-21 VITALS — BP 124/62 | HR 81 | Ht 60.0 in | Wt 232.6 lb

## 2022-03-21 DIAGNOSIS — J849 Interstitial pulmonary disease, unspecified: Secondary | ICD-10-CM | POA: Diagnosis not present

## 2022-03-21 DIAGNOSIS — R9431 Abnormal electrocardiogram [ECG] [EKG]: Secondary | ICD-10-CM

## 2022-03-21 DIAGNOSIS — R0609 Other forms of dyspnea: Secondary | ICD-10-CM

## 2022-03-21 DIAGNOSIS — E785 Hyperlipidemia, unspecified: Secondary | ICD-10-CM | POA: Diagnosis not present

## 2022-03-21 DIAGNOSIS — Z136 Encounter for screening for cardiovascular disorders: Secondary | ICD-10-CM

## 2022-03-21 DIAGNOSIS — I2510 Atherosclerotic heart disease of native coronary artery without angina pectoris: Secondary | ICD-10-CM

## 2022-03-21 DIAGNOSIS — G4733 Obstructive sleep apnea (adult) (pediatric): Secondary | ICD-10-CM

## 2022-03-21 MED ORDER — BUDESONIDE-FORMOTEROL FUMARATE 160-4.5 MCG/ACT IN AERO: 2.0000 | INHALATION_SPRAY | Freq: Two times a day (BID) | RESPIRATORY_TRACT | 5 refills | Status: DC

## 2022-03-21 NOTE — Patient Instructions (Signed)
Symbicort two puffs in the morning and two puffs in the evening, and rinse your mouth after each use  Will arrange for pulmonary function test  Follow up in 8 weeks

## 2022-03-21 NOTE — Progress Notes (Signed)
[facility]  Chief Complaint  Patient presents with   Follow-up    Pt states she still is having problems with SOB. Had a recent CT performed.    Constitutional:  BP 124/62 (BP Location: Right Wrist, Patient Position: Sitting, Cuff Size: Normal)   Pulse 81   Ht 5' (1.524 m)   Wt 232 lb 9.4 oz (105.5 kg)   LMP 08/16/1992   SpO2 96% Comment: RA  BMI 45.42 kg/m   Past Medical History:  Allergies, GERD, HTN, Nephrolithiasis, Chronic pain  Past Surgical History:  She  has a past surgical history that includes Umbilical hernia repair (2010); Carpal tunnel release (1998); dental implants (2010); Esophageal dilation (2010); Hammer toe surgery; Septoplasty; Rotator cuff repair (Right, 09/2011); Pelvic laparoscopy (1990);  dilatation and curettage (2000); Rotator cuff repair (Left, 11/11/2013); Cataract extraction (Bilateral); Dental surgery; Carpal tunnel release (Left, 05/21/2020); Dilatation & curettage/hysteroscopy with myosure (N/A, 08/22/2021); Operative ultrasound (N/A, 08/22/2021); LEFT HEART CATH AND CORONARY ANGIOGRAPHY (N/A, 01/25/2022); and CORONARY STENT INTERVENTION (N/A, 01/26/2022).  Brief Summary:  Kerry [name] is a 72 y.o. female with obstructive sleep apnea.      Subjective:   She is here with her husband.  CT chest showed mild changes of possible UIP, air trapping, and malacia.  Serology was negative.  She is hoping to start cardiac rehab or pool aerobics.   She had exposure to baby powder, dust from [place], dust from roads, and water from roof.    Using CPAP nightly.  Physical Exam:   Appearance - well kempt   ENMT - no sinus tenderness, no oral exudate, no LAN, Mallampati 3 airway, no stridor, wears dentures  Respiratory - equal breath sounds bilaterally, no wheezing or rales  CV - s1s2 regular rate and rhythm, no murmurs  Ext - no clubbing, no edema  Skin - no rashes  Psych - normal mood and  affect     Pulmonary Tests:  Serology 02/03/22 >> ANA, ANCA, RF, HP negative  Chest Imaging:  Cardiac CT 12/27/21 >> small scattered nodules up to 6 mm Cardiac PET 01/24/22 >> mosaic pattern of GGO HRCT chest 03/09/22 >> atherosclerosis and coronary calcifications; patchy mild GGO, septal thickening, rare subpleural reticulation b/l (indeterminate UIP); extensive air trapping; collapse of trachea and main bronchi with expiration (tracheobronchomalacia); scattered nodules up to 3 mm, fatty liver   Sleep Tests:  HST 03/04/15 >> AHI 21.6, SpO2 low 80% Auto CPAP 01/03/22 to 02/01/22 >> used on 27 of 30 nights with average 5 hrs 24 min.  Average AHI 0.5 with median CPAP 7 and 95 th percentile CPAP 9 cm H2O  Cardiac Tests:  Echo 04/25/19 >> EF 60 to 65%, mild LVH, grade 2 DD  Social History:  She  reports that she has never smoked. She has never used smokeless tobacco. She reports that she does not drink alcohol and does not use drugs.  Family History:  Her family history includes Arthritis in her sister; Crohn's disease in her brother; Diabetes in her mother; Glaucoma in her brother; Heart attack in her father; Kidney failure in her brother; Ovarian cancer (age of onset: 69) in her sister; Rheumatologic disease in her mother and sister; Ulcerative colitis in her brother.     Assessment/Plan:   Dyspnea, chronic cough, and abnormal CT chest. - she likely had COVID infection in September 2023 - has mild possible UIP pattern; has family history of RA - has air trapping which could  be seen with asthma - has tracheobronchomalacia - will have her try symbicort 160 two puffs bid - will arrange for pulmonary function test - okay for her to start cardiac rehab or pool aerobics  Obstructive sleep apnea. - she is compliant with CPAP and reports benefit from therapy - she uses [facility] for her DME - current CPAP ordered March 2022 - continue auto CPAP 5 to 15 cm H2O - advised she can  use CPAP as needed during the day to help with her breathing  Obesity. - discussed how her weight is contributing to her health issues, including her sleep apnea  Coronary artery disease s/p stenting. - followed by Dr. [name] with cardiology  Time Spent Involved in Patient Care on Day of Examination:  36 minutes  Follow up:   Patient Instructions  Symbicort two puffs in the morning and two puffs in the evening, and rinse your mouth after each use  Will arrange for pulmonary function test  Follow up in 8 weeks  Medication List:   Allergies as of 03/21/2022       Reactions   Meloxicam Other (See Comments)   Made patient have stomach pain ,sore throat, joint pains, face flush    Molds & Smuts Other (See Comments), Palpitations, Shortness Of Breath, Swelling   Cefixime Swelling   Celebrex [celecoxib] Swelling   Cobalt Other (See Comments)   Per allergist   Gabapentin Other (See Comments)   Joint pain   Levofloxacin Other (See Comments)   Muscle and joint pain   Molybdenum Other (See Comments)   Per allergist   Other Other (See Comments)   TEQUIN. TEQUIN - JOINT AND MUSCLE PAIN Other Reaction: painful joints TEQUIN. TEQUIN - JOINT AND MUSCLE PAIN Tantal Metal   Penicillins Hives   Robaxin [methocarbamol]    Tomato Hives   Adhesive [tape] Rash   Sensitive to EKG leads; sensitive to 63M Micropore tape   Dust Mite Extract Itching, Other (See Comments), Palpitations   Tetracycline Hcl Rash   Can take Doxy   Tetracyclines & Related Rash        Medication List        Accurate as of March 21, 2022  3:16 PM. If you have any questions, ask your nurse or doctor.          ascorbic acid 250 MG tablet Commonly known as: VITAMIN C Take 250 mg by mouth daily.   aspirin EC 81 MG tablet Take 1 tablet (81 mg total) by mouth daily. Swallow whole.   budesonide-formoterol 160-4.5 MCG/ACT inhaler Commonly known as: Symbicort Inhale 2 puffs into the lungs 2 (two)  times daily. Started by: Chesley Mires, MD   carboxymethylcellulose 0.5 % Soln Commonly known as: REFRESH PLUS 1 drop 2 (two) times daily as needed (for dry eyes).   carvedilol 12.5 MG tablet Commonly known as: COREG Take 1 tablet (12.5 mg total) by mouth 2 (two) times daily with a meal.   CINNAMON PO Take 2.5 mLs by mouth daily.   clopidogrel 75 MG tablet Commonly known as: PLAVIX Take 1 tablet (75 mg total) by mouth daily.   Coenzyme Q10 200 MG capsule Take 200 mg by mouth daily.   COLLAGEN PO Take by mouth. Collagen Peptides. 1 scoop   cyanocobalamin 1000 MCG/ML injection Commonly known as: VITAMIN B12 Inject 1,000 mcg into the muscle once. Once a month   cyanocobalamin 1000 MCG tablet Commonly known as: VITAMIN B12 Take 1,000 mcg by mouth  every Monday, Wednesday, and Friday.   Fish Oil 500 MG Caps Take 500 mg by mouth daily.   NASAL CLEANSE RINSE MIX NA Place into the nose. Xlear rinse   nitroGLYCERIN 0.4 MG SL tablet Commonly known as: NITROSTAT Place 1 tablet (0.4 mg total) under the tongue every 5 (five) minutes x 3 doses as needed for chest pain.   NON FORMULARY Take 3 fluid ounces by mouth daily. Goat Kiefer   PROBIOTIC ADVANCED PO Take by mouth. Patient states taking 5 billion, one capsule daily. Suprema Dophilus probiotic   rosuvastatin 20 MG tablet Commonly known as: CRESTOR Take 1 tablet (20 mg total) by mouth daily.   SUPER MULTI-VITAMIN PO Take 1 capsule by mouth daily. Solgar Formula VM-75   TART CHERRY ADVANCED PO Take 2 tablets by mouth daily.   MISC NATURAL PRODUCTS PO Take by mouth. Black currant seed oil   Vitamin D3 125 MCG (5000 UT) capsule Generic drug: Cholecalciferol Take 5,000 Units by mouth. Every other day   Vitamin K2 100 MCG Tabs Take 180 mcg by mouth. Every 6th day. MK-7   XLEAR SINUS CARE SPRAY NA Place into the nose. 2 sprays at bedtime        Signature:  Chesley Mires, MD [place] Pager  - [phone] 03/21/2022, 3:16 PM

## 2022-03-23 ENCOUNTER — Ambulatory Visit (INDEPENDENT_AMBULATORY_CARE_PROVIDER_SITE_OTHER): Payer: Medicare Other | Admitting: Family Medicine

## 2022-03-23 VITALS — BP 140/90 | HR 78 | Ht 60.0 in | Wt 232.0 lb

## 2022-03-23 DIAGNOSIS — M170 Bilateral primary osteoarthritis of knee: Secondary | ICD-10-CM | POA: Diagnosis not present

## 2022-03-23 DIAGNOSIS — M109 Gout, unspecified: Secondary | ICD-10-CM | POA: Diagnosis not present

## 2022-03-23 NOTE — Progress Notes (Signed)
[name] [place] Phone: [phone] Subjective:   Kerry [name], am serving as a scribe for Dr. Hulan Saas.  I'm seeing this patient by the request  of:  Street, [name], MD  CC: Follow-up for pain and knee pain  [id number]  Kerry [name] is a 72 y.o. female coming in with complaint of back and neck pain. OMT 02/10/2022. Patient states that she had 1 day of cardiac rehab and the machine she was on was to big and the equipment pulled on her leg and she heard a pop in the right knee and that was hurting pretty bad. Sunday she heard a loud pop and the pain felt a lot better. Patient is still feeling some pain in both knees and also in her left foot. Patient wanting to talk about the next steps in regard to the mychart messages about water aerobics and or cardiac rehab.   Medications patient has been prescribed: None  Taking:         Reviewed prior external information including notes and imaging from previsou exam, outside providers and external EMR if available.   As well as notes that were available from care everywhere and other healthcare systems.  Past medical history, social, surgical and family history all reviewed in electronic medical record.  No pertanent information unless stated regarding to the chief complaint.   Past Medical History:  Diagnosis Date   Abnormal uterine bleeding    Allergic rhinitis, cause unspecified    Arthritis of knee, right    Bursitis of left shoulder    Cherry hemangioma 07/28/15   vulva   Chronic pain    Dyspareunia    Elevated cholesterol    Elevated uric acid in blood 2021   Fibroid    Foot fracture, left 01/2015   and torn tendons   GERD (gastroesophageal reflux disease)    HTN (hypertension)    Kidney stones    Sleep apnea    Urinary incontinence     Allergies  Allergen Reactions   Meloxicam Other (See Comments)    Made patient have  stomach pain ,sore throat, joint pains, face flush    Molds & Smuts Other (See Comments), Palpitations, Shortness Of Breath and Swelling   Cefixime Swelling   Celebrex [Celecoxib] Swelling   Cobalt Other (See Comments)    Per allergist   Gabapentin Other (See Comments)    Joint pain   Levofloxacin Other (See Comments)    Muscle and joint pain   Molybdenum Other (See Comments)    Per allergist   Other Other (See Comments)    TEQUIN. TEQUIN - JOINT AND MUSCLE PAIN Other Reaction: painful joints TEQUIN. TEQUIN - JOINT AND MUSCLE PAIN Tantal Metal   Penicillins Hives   Robaxin [Methocarbamol]    Tomato Hives   Adhesive [Tape] Rash    Sensitive to EKG leads; sensitive to 100M Micropore tape   Dust Mite Extract Itching, Other (See Comments) and Palpitations   Tetracycline Hcl Rash    Can take Doxy   Tetracyclines & Related Rash     Review of Systems:  No headache, visual changes, nausea, vomiting, diarrhea, constipation, dizziness, abdominal pain, skin rash, fevers, chills, night sweats, weight loss, swollen lymph nodes, body aches, joint swelling, chest pain, shortness of breath, mood changes. POSITIVE muscle aches  Objective  Blood pressure (!) 140/90, pulse 78, height 5' (1.524 m), weight 232 lb (105.2 kg), last menstrual period  08/16/1992, SpO2 96 %.   General: No apparent distress alert and oriented x3 mood and affect normal, dressed appropriately.  HEENT: Pupils equal, extraocular movements intact  Respiratory: Patient's speak in full sentences and does not appear short of breath  Cardiovascular: No lower extremity edema, non tender, no erythema  Patient is actually more in a wheelchair at the moment secondary to the pain in the knees.  Severe tenderness to palpation bilaterally.  No crepitus noted with instability of the right greater than left knee with valgus and varus force  After informed written and verbal consent, patient was seated on exam table. Right knee was prepped  with alcohol swab and utilizing anterolateral approach, patient's right knee space was injected with 4:1  marcaine 0.5%: Kenalog '40mg'$ /dL. Patient tolerated the procedure well without immediate complications.  After informed written and verbal consent, patient was seated on exam table. Left knee was prepped with alcohol swab and utilizing anterolateral approach, patient's left knee space was injected with 4:1  marcaine 0.5%: Kenalog '40mg'$ /dL. Patient tolerated the procedure well without immediate complications.    Assessment and Plan:  Arthritis of knee, degenerative Injectoin given bilateral. Worsening pain.  He did not want anything to be an obstacle in her weight, increasing activity.  Patient is significantly deconditioned from her heart surgery and has not been able to start physical therapy for a multitude of reasons.  Encouraged her to at least get into the gym on a regular basis.  We have attempted to viscosupplementation with no significant benefit.  Follow-up with me again in 6 to 8 weeks.        The above documentation has been reviewed and is accurate and complete [name], DO          Note: This dictation was prepared with Dragon dictation along with smaller phrase technology. Any transcriptional errors that result from this process are unintentional.

## 2022-03-23 NOTE — Patient Instructions (Addendum)
Good to see you Injection given in both knees today Consider Cymbalta See me after the eclipse

## 2022-03-24 LAB — NMR, LIPOPROFILE
Cholesterol, Total: 154 mg/dL (ref 100–199)
HDL Particle Number: 34.7 umol/L (ref >=30.5)
HDL-C: 48 mg/dL (ref >39)
LDL Particle Number: 1512 nmol/L — ABNORMAL HIGH (ref <1000)
LDL Size: 20.4 nm — ABNORMAL LOW (ref >20.5)
LDL-C (NIH Calc): 88 mg/dL (ref 0–99)
LP-IR Score: 84 — ABNORMAL HIGH (ref <=45)
Small LDL Particle Number: 883 nmol/L — ABNORMAL HIGH (ref <=527)
Triglycerides: 98 mg/dL (ref 0–149)

## 2022-03-24 LAB — HEPATIC FUNCTION PANEL
ALT: 19 IU/L (ref 0–32)
AST: 22 IU/L (ref 0–40)
Albumin: 4.7 g/dL (ref 3.8–4.8)
Alkaline Phosphatase: 57 IU/L (ref 44–121)
Bilirubin Total: 0.2 mg/dL (ref 0.0–1.2)
Bilirubin, Direct: <0.1 mg/dL (ref 0.00–0.40)
Total Protein: 7.1 g/dL (ref 6.0–8.5)

## 2022-03-24 NOTE — Assessment & Plan Note (Signed)
Injectoin given bilateral. Worsening pain.  He did not want anything to be an obstacle in her weight, increasing activity.  Patient is significantly deconditioned from her heart surgery and has not been able to start physical therapy for a multitude of reasons.  Encouraged her to at least get into the gym on a regular basis.  We have attempted to viscosupplementation with no significant benefit.  Follow-up with me again in 6 to 8 weeks.

## 2022-03-24 NOTE — Assessment & Plan Note (Signed)
Has had elevated uric acid previously.  Not taking any prescription medication for it at this moment.

## 2022-03-27 ENCOUNTER — Other Ambulatory Visit: Payer: Self-pay

## 2022-03-27 DIAGNOSIS — I2510 Atherosclerotic heart disease of native coronary artery without angina pectoris: Secondary | ICD-10-CM

## 2022-03-27 DIAGNOSIS — E785 Hyperlipidemia, unspecified: Secondary | ICD-10-CM

## 2022-03-27 MED ORDER — EZETIMIBE 10 MG PO TABS: 10.0000 mg | ORAL_TABLET | Freq: Every day | ORAL | 3 refills | Status: DC

## 2022-03-28 ENCOUNTER — Other Ambulatory Visit (HOSPITAL_BASED_OUTPATIENT_CLINIC_OR_DEPARTMENT_OTHER): Payer: Self-pay

## 2022-04-10 ENCOUNTER — Encounter: Payer: Self-pay | Admitting: Internal Medicine

## 2022-04-10 ENCOUNTER — Ambulatory Visit: Payer: Medicare Other | Attending: Internal Medicine | Admitting: Internal Medicine

## 2022-04-10 VITALS — BP 203/83 | HR 93 | Ht 60.0 in | Wt 230.6 lb

## 2022-04-10 DIAGNOSIS — R0600 Dyspnea, unspecified: Secondary | ICD-10-CM | POA: Insufficient documentation

## 2022-04-10 DIAGNOSIS — I2510 Atherosclerotic heart disease of native coronary artery without angina pectoris: Secondary | ICD-10-CM | POA: Diagnosis not present

## 2022-04-10 DIAGNOSIS — Z136 Encounter for screening for cardiovascular disorders: Secondary | ICD-10-CM | POA: Diagnosis not present

## 2022-04-10 DIAGNOSIS — G4733 Obstructive sleep apnea (adult) (pediatric): Secondary | ICD-10-CM | POA: Insufficient documentation

## 2022-04-10 DIAGNOSIS — R0609 Other forms of dyspnea: Secondary | ICD-10-CM | POA: Diagnosis not present

## 2022-04-10 DIAGNOSIS — E785 Hyperlipidemia, unspecified: Secondary | ICD-10-CM | POA: Insufficient documentation

## 2022-04-10 DIAGNOSIS — I10 Essential (primary) hypertension: Secondary | ICD-10-CM | POA: Insufficient documentation

## 2022-04-10 MED ORDER — AMLODIPINE BESYLATE 2.5 MG PO TABS: 2.5000 mg | ORAL_TABLET | Freq: Every day | ORAL | 3 refills | Status: DC

## 2022-04-10 MED ORDER — CLOPIDOGREL BISULFATE 75 MG PO TABS: 75.0000 mg | ORAL_TABLET | Freq: Every day | ORAL | 3 refills | Status: DC

## 2022-04-10 MED ORDER — EZETIMIBE 10 MG PO TABS: 10.0000 mg | ORAL_TABLET | Freq: Every day | ORAL | 3 refills | Status: DC

## 2022-04-10 MED ORDER — CARVEDILOL 12.5 MG PO TABS: 12.5000 mg | ORAL_TABLET | Freq: Two times a day (BID) | ORAL | 3 refills | Status: DC

## 2022-04-10 NOTE — Patient Instructions (Signed)
Medication Instructions:  AMLODIPINE  2.5 MG DAILY  ZETIA 10 MG DAILY   *If you need a refill on your cardiac medications before your next appointment, please call your pharmacy*   Lab Work: NMR AND HEPATIC IN 8 WEEKS FASTING   If you have labs (blood work) drawn today and your tests are completely normal, you will receive your results only by: [place] (if you have MyChart) OR A paper copy in the mail If you have any lab test that is abnormal or we need to change your treatment, we will call you to review the results.   Testing/Procedures:    Follow-Up: At [facility], you and your health needs are our priority.  As part of our continuing mission to provide you with exceptional heart care, we have created designated Provider Care Teams.  These Care Teams include your primary Cardiologist (physician) and Advanced Practice Providers (APPs -  Physician Assistants and Nurse Practitioners) who all work together to provide you with the care you need, when you need it.  We recommend signing up for the patient portal called "MyChart".  Sign up information is provided on this After Visit Summary.  MyChart is used to connect with patients for Virtual Visits (Telemedicine).  Patients are able to view lab/test results, encounter notes, upcoming appointments, etc.  Non-urgent messages can be sent to your provider as well.   To learn more about what you can do with MyChart, go to [email or web address].    Your next appointment:   3 month(s)  Provider:   Dorris Carnes, MD     Other Instructions

## 2022-04-10 NOTE — Progress Notes (Unsigned)
Cardiology Office Note:    Date:  04/10/2022   ID:  Kerry [name], DOB 06-08-1950, MRN [id number]  PCP:  Street, [name], MD  Cardiologist:  Dorris Carnes, MD    Pt presents for follow up of CAD   History of Present Illness:    Kerry [name] is a 72 y.o. female with hx of HTN, CAD   I saw the pt in clinic last year  Presented for evaluation of SOB   Calcium score CT done   Ca score was 1051   Started on statin/ASA.   Set up for PET CT which had multiple perfusion defects   LHC done in Jan 2024 showed multivessel CAD    Set up for  due to concern for possible allergy to stent  She underwent PCI/DES to OM1 and PTCA to LC/OM2.  Platinum stent used  She was placed on ASA and Plavix for 6 months  The pt was seen by Elvin So in later Jan  Still had some SOB     procedure  Since seen the pt says she still get SOB with activity   She denies CP  Unable to go through cardiac rehab.   Machines not the right size   Does hae leg pain with walking         Past Medical History:  Diagnosis Date   Abnormal uterine bleeding    Allergic rhinitis, cause unspecified    Arthritis of knee, right    Bursitis of left shoulder    Cherry hemangioma 07/28/15   vulva   Chronic pain    Dyspareunia    Elevated cholesterol    Elevated uric acid in blood 2021   Fibroid    Foot fracture, left 01/2015   and torn tendons   GERD (gastroesophageal reflux disease)    HTN (hypertension)    Kidney stones    Sleep apnea    Urinary incontinence     Past Surgical History:  Procedure Laterality Date    dilatation and curettage  2000   CARPAL TUNNEL RELEASE  1998   CARPAL TUNNEL RELEASE Left 05/21/2020   CATARACT EXTRACTION Bilateral    CORONARY STENT INTERVENTION N/A 01/26/2022   Procedure: CORONARY STENT INTERVENTION;  Surgeon: Burnell Blanks, MD;  Location: [facility];  Service: Cardiovascular;  Laterality: N/A;   dental implants  2010   DENTAL SURGERY     DILATATION &  CURETTAGE/HYSTEROSCOPY WITH MYOSURE N/A 08/22/2021   Procedure: DILATATION & CURETTAGE/HYSTEROSCOPY WITH MYOSURE RESECTION OF ENDOMETRIAL MASS;  Surgeon: Nunzio Cobbs, MD;  Location: [facility];  Service: Gynecology;  Laterality: N/A;   ESOPHAGEAL DILATION  2010   HAMMER TOE SURGERY     rt   LEFT HEART CATH AND CORONARY ANGIOGRAPHY N/A 01/25/2022   Procedure: LEFT HEART CATH AND CORONARY ANGIOGRAPHY;  Surgeon: Troy Sine, MD;  Location: [facility];  Service: Cardiovascular;  Laterality: N/A;   OPERATIVE ULTRASOUND N/A 08/22/2021   Procedure: OPERATIVE ULTRASOUND GUIDED HYSTEROSCOPY;  Surgeon: Nunzio Cobbs, MD;  Location: [facility];  Service: Gynecology;  Laterality: N/A;   PELVIC LAPAROSCOPY  1990   ROTATOR CUFF REPAIR Right 09/2011   Dr. Tamera Punt   ROTATOR CUFF REPAIR Left 11/11/2013   Dr. Tamera Punt    SEPTOPLASTY     UMBILICAL HERNIA REPAIR  2010   Patient reports mesh was used for the repair.    Current Medications: Current Meds  Medication Sig   ascorbic acid (VITAMIN C) 250 MG tablet Take 250 mg by mouth daily.   aspirin EC 81 MG tablet Take 1 tablet (81 mg total) by mouth daily. Swallow whole.   budesonide-formoterol (SYMBICORT) 160-4.5 MCG/ACT inhaler Inhale 2 puffs into the lungs 2 (two) times daily.   carboxymethylcellulose (REFRESH PLUS) 0.5 % SOLN 1 drop 2 (two) times daily as needed (for dry eyes).   carvedilol (COREG) 12.5 MG tablet Take 1 tablet (12.5 mg total) by mouth 2 (two) times daily with a meal.   Cholecalciferol (VITAMIN D3) 5000 units CAPS Take 5,000 Units by mouth. Every other day   CINNAMON PO Take 2.5 mLs by mouth daily.   clopidogrel (PLAVIX) 75 MG tablet Take 1 tablet (75 mg total) by mouth daily.   Coenzyme Q10 200 MG capsule Take 200 mg by mouth daily.   COLLAGEN PO Take by mouth. Collagen Peptides. 1 scoop   cyanocobalamin (VITAMIN B12) 1000 MCG tablet Take 1,000 mcg by mouth every Monday,  Wednesday, and Friday.   cyanocobalamin (VITAMIN B12) 1000 MCG/ML injection Inject 1,000 mcg into the muscle once. Once a month   Menatetrenone (VITAMIN K2) 100 MCG TABS Take 180 mcg by mouth. Every 6th day. MK-7   Misc Natural Product Nasal (NASAL CLEANSE RINSE MIX NA) Place into the nose. Xlear rinse   Misc Natural Products (TART CHERRY ADVANCED PO) Take 2 tablets by mouth daily.   MISC NATURAL PRODUCTS PO Take by mouth. Black currant seed oil   Multiple Vitamins-Minerals (SUPER MULTI-VITAMIN PO) Take 1 capsule by mouth daily. Solgar Formula VM-75   nitroGLYCERIN (NITROSTAT) 0.4 MG SL tablet Place 1 tablet (0.4 mg total) under the tongue every 5 (five) minutes x 3 doses as needed for chest pain.   NON FORMULARY Take 3 fluid ounces by mouth daily. Goat Kiefer   Omega-3 Fatty Acids (FISH OIL) 500 MG CAPS Take 500 mg by mouth daily.   Probiotic Product (PROBIOTIC ADVANCED PO) Take by mouth. Patient states taking 5 billion, one capsule daily. Suprema Dophilus probiotic   rosuvastatin (CRESTOR) 20 MG tablet Take 1 tablet (20 mg total) by mouth daily.   Sodium Chloride-Xylitol (XLEAR SINUS CARE SPRAY NA) Place into the nose. 2 sprays at bedtime     Allergies:   Meloxicam, Molds & smuts, Cefixime, Celebrex [celecoxib], Cobalt, Gabapentin, Levofloxacin, Molybdenum, Other, Penicillins, Robaxin [methocarbamol], Tomato, Adhesive [tape], Dust mite extract, Tetracycline hcl, and Tetracyclines & related   Social History   Socioeconomic History   Marital status: Married    Spouse name: Not on file   Number of children: Not on file   Years of education: Not on file   Highest education level: Not on file  Occupational History   Occupation: Retired  Tobacco Use   Smoking status: Never   Smokeless tobacco: Never  Vaping Use   Vaping Use: Never used  Substance and Sexual Activity   Alcohol use: No    Alcohol/week: 0.0 standard drinks of alcohol   Drug use: No   Sexual activity: Not Currently     Partners: Male    Birth control/protection: Post-menopausal  Other Topics Concern   Not on file  Social History Narrative   Are you right handed or left handed? Right handed   Are you currently employed ? No retired   What is your current occupation?   Do you live at home alone? No   Who lives with you? Lives with husband.    What type of  home do you live in: 1 story or 2 story? Two story home.       Social Determinants of Health   Financial Resource Strain: Not on file  Food Insecurity: No Food Insecurity (01/25/2022)   Hunger Vital Sign    Worried About Running Out of Food in the Last Year: Never true    Ran Out of Food in the Last Year: Never true  Transportation Needs: No Transportation Needs (01/25/2022)   PRAPARE - Hydrologist (Medical): No    Lack of Transportation (Non-Medical): No  Physical Activity: Not on file  Stress: Not on file  Social Connections: Not on file     Family History: The patient's family history includes Arthritis in her sister; Crohn's disease in her brother; Diabetes in her mother; Glaucoma in her brother; Heart attack in her father; Kidney failure in her brother; Ovarian cancer (age of onset: 32) in her sister; Rheumatologic disease in her mother and sister; Ulcerative colitis in her brother. ROS:   Please see the history of present illness.    All 14 point review of systems negative except as described per history of present illness  EKGs/Labs/Other Studies Reviewed:    EKG   Not done today   Recent Labs: 01/24/2022: B Natriuretic Peptide 137.3 01/27/2022: BUN 13; Creatinine, Ser 0.69; Hemoglobin 12.6; Platelets 203; Potassium 3.5; Sodium 137 03/21/2022: ALT 19  Recent Lipid Panel    Component Value Date/Time   CHOL 257 (H) 02/09/2011 1027   TRIG 136 02/09/2011 1027   HDL 46 02/09/2011 1027   CHOLHDL 5.6 02/09/2011 1027   VLDL 27 02/09/2011 1027   LDLCALC 184 (H) 02/09/2011 1027    Physical Exam:    VS:  BP  (!) 203/83   Pulse 93   Ht 5' (1.524 m)   Wt 230 lb 9.6 oz (104.6 kg)   LMP 08/16/1992   SpO2 93%   BMI 45.04 kg/m     Wt Readings from Last 3 Encounters:  04/10/22 230 lb 9.6 oz (104.6 kg)  03/23/22 232 lb (105.2 kg)  03/21/22 232 lb 9.4 oz (105.5 kg)     GEN: Obese 72 yo in no acute distress HEENT: Normal NECK: No JVD; No carotid bruit CARDIAC: RRR, no murmur RESPIRATORY:  Clear to auscultation ABDOMEN: Soft, non-tender, non-distended MUSCULOSKELETAL:  No edema; No deformity  SKIN: Warm and dry LOWER EXTREMITIES: no swelling NEUROLOGIC:  Alert and oriented x 3 PSYCHIATRIC:  Normal affect    Stent intervention 01/26/22    Mid Cx lesion is 80% stenosed.   Prox Cx lesion is 20% stenosed.   2nd Diag lesion is 60% stenosed.   3rd Mrg lesion is 90% stenosed.   A drug-eluting stent was successfully placed using a SYNERGY XD 2.25X20.   Balloon angioplasty was performed using a BALLN SAPPHIRE 2.0X12.   Post intervention, there is a 0% residual stenosis.   Post intervention, there is a 60% residual stenosis.   Severe stenosis in the mid Circumflex affecting flow into the first obtuse marginal branch and the second obtuse marginal branch.  Successful PTCA/DES x 1 OM1 (Synergy stent platform as felt to be the most favorable given her metal allergy profile) Successful balloon angioplasty mid Circumflex/OM2   Recommendations: DAPT with ASA and Plavix for at least six months.    L heart cath  Jan 2024      2nd Diag lesion is 60% stenosed.   Mid Cx lesion is 80% stenosed.  3rd Mrg lesion is 90% stenosed.   Prox Cx lesion is 20% stenosed.   Prox RCA lesion is 40% stenosed.   Mid RCA lesion is 10% stenosed.   Dist RCA lesion is 20% stenosed.   RPDA-1 lesion is 30% stenosed.   RPDA-2 lesion is 70% stenosed.   LV end diastolic pressure is severely elevated.   Multivessel CAD with 60% ostial narrowing of the second diagonal branch of the LAD; 20% proximal circumflex stenosis  with mid circumflex bifurcation stenosis of 80 and 90%; dominant RCA with 40% proximal stenosis, mild 10 and 20% mid stenoses, and 30 and 70% mid and distal PDA stenosis.   Serve global LV contractility with EF estimated 55 to 60%.  Elevated LVEDP at 30 mm.   Significant blood pressure elevation for which the patient received hydralazine 10 mg x 2 post procedure.   RECOMMENDATION: Patient most likely will need PCI for her bifurcation circumflex stenosis.  She was very concerned about potential metal allergies.  We reviewed the stents with her and her concern with the molybdenum which is present in both the Synergy and Medtronic stents.  The Synergy stent also has platinum chromium in the Resolute stent platinum iridium in addition to cobalt alloy inner core.  Will discuss with his representatives.  Plan intervention tomorrow once further discussion obtained with patient family and device reps.    Impression     1  CAD  Pt s/p PTCA/Stent and PCI in Jan 2024   Still with dyspnea  Would recomm optimizing BP control  2  HTN  BP is severely elevated today    I would add low dose amlodipine    Follow BP ath home Plan for follow up in Jun 2024   3   HL   Need tighter control of lipids   LDL 88     I would recomm adding Zetia and checking lipids in 8 wks    4  OSA   Continue CPAP   Medication Adjustments/Labs and Tests Ordered: Current medicines are reviewed at length with the patient today.  Concerns regarding medicines are outlined above.  No orders of the defined types were placed in this encounter.  Medication changes: No orders of the defined types were placed in this encounter.   Signed, Park Liter, MD, [facility] 04/10/2022 4:32 PM    [facility]

## 2022-05-08 DIAGNOSIS — I110 Hypertensive heart disease with heart failure: Secondary | ICD-10-CM | POA: Diagnosis not present

## 2022-05-08 DIAGNOSIS — I25119 Atherosclerotic heart disease of native coronary artery with unspecified angina pectoris: Secondary | ICD-10-CM | POA: Diagnosis not present

## 2022-05-08 DIAGNOSIS — I5189 Other ill-defined heart diseases: Secondary | ICD-10-CM | POA: Diagnosis not present

## 2022-05-08 DIAGNOSIS — I5032 Chronic diastolic (congestive) heart failure: Secondary | ICD-10-CM | POA: Diagnosis not present

## 2022-05-09 NOTE — Progress Notes (Unsigned)
[name] [address] [place] [address] Phone: [phone] Subjective:   Kerry [name], am serving as a scribe for Dr. Antoine Primas.  I'm seeing this patient by the request  of:  Street, [name], MD  CC: knee pain and back pain   [id number]  03/23/2022 Has had elevated uric acid previously. Not taking any prescription medication for it at this moment.   njectoin given bilateral. Worsening pain.  He did not want anything to be an obstacle in her weight, increasing activity.  Patient is significantly deconditioned from her heart surgery and has not been able to start physical therapy for a multitude of reasons.  Encouraged her to at least get into the gym on a regular basis.  We have attempted to viscosupplementation with no significant benefit.  Follow-up with me again in 6 to 8 weeks.   Updated 05/10/2022 Kerry [name] is a 72 y.o. female coming in with complaint of knee pain, knee injections last visit in 03/23/22. Patient did not have as much relief from injections as she hoped. R knee became worse after injection. Pain over medial joint line. Does feel like the L knee has less pain. Patient has started water aerobics for 4 sessions.   R side of lower back continues to be painful. Hard to lift R leg into hip flexion. Saw neuka practitioner last week. Told patient that R SI joint was aggravated.   Patient also c/o L great toe pain especially near the toe nail. Figures that her antalgic gait from R knee pain and L ankle pain are causing increased weight on this toe. Does note swelling in the toe with discoloration. PCP told her her that she has Raynauds. Does not feel like feet or hand are more cold than usual.   Also notes in general she is very tired.   [facility]      Past Medical History:  Diagnosis Date   Abnormal uterine bleeding    Allergic rhinitis, cause unspecified     Arthritis of knee, right    Bursitis of left shoulder    Cherry hemangioma 07/28/15   vulva   Chronic pain    Dyspareunia    Elevated cholesterol    Elevated uric acid in blood 2021   Fibroid    Foot fracture, left 01/2015   and torn tendons   GERD (gastroesophageal reflux disease)    HTN (hypertension)    Kidney stones    Sleep apnea    Urinary incontinence    Past Surgical History:  Procedure Laterality Date    dilatation and curettage  2000   CARPAL TUNNEL RELEASE  1998   CARPAL TUNNEL RELEASE Left 05/21/2020   CATARACT EXTRACTION Bilateral    CORONARY STENT INTERVENTION N/A 01/26/2022   Procedure: CORONARY STENT INTERVENTION;  Surgeon: Kathleene Hazel, MD;  Location: [facility];  Service: Cardiovascular;  Laterality: N/A;   dental implants  2010   DENTAL SURGERY     DILATATION & CURETTAGE/HYSTEROSCOPY WITH MYOSURE N/A 08/22/2021   Procedure: DILATATION & CURETTAGE/HYSTEROSCOPY WITH MYOSURE RESECTION OF ENDOMETRIAL MASS;  Surgeon: Patton Salles, MD;  Location: [facility];  Service: Gynecology;  Laterality: N/A;   ESOPHAGEAL DILATION  2010   HAMMER TOE SURGERY     rt   LEFT HEART CATH AND CORONARY ANGIOGRAPHY N/A 01/25/2022   Procedure: LEFT HEART CATH AND CORONARY ANGIOGRAPHY;  Surgeon: Tresa Endo,  Clovis Pu, MD;  Location: [facility];  Service: Cardiovascular;  Laterality: N/A;   OPERATIVE ULTRASOUND N/A 08/22/2021   Procedure: OPERATIVE ULTRASOUND GUIDED HYSTEROSCOPY;  Surgeon: Patton Salles, MD;  Location: [facility];  Service: Gynecology;  Laterality: N/A;   PELVIC LAPAROSCOPY  1990   ROTATOR CUFF REPAIR Right 09/2011   Dr. Ave Filter   ROTATOR CUFF REPAIR Left 11/11/2013   Dr. Ave Filter    SEPTOPLASTY     UMBILICAL HERNIA REPAIR  2010   Patient reports mesh was used for the repair.   Social History   Socioeconomic History   Marital status: Married    Spouse name: Not on file   Number of children:  Not on file   Years of education: Not on file   Highest education level: Not on file  Occupational History   Occupation: Retired  Tobacco Use   Smoking status: Never   Smokeless tobacco: Never  Vaping Use   Vaping Use: Never used  Substance and Sexual Activity   Alcohol use: No    Alcohol/week: 0.0 standard drinks of alcohol   Drug use: No   Sexual activity: Not Currently    Partners: Male    Birth control/protection: Post-menopausal  Other Topics Concern   Not on file  Social History Narrative   Are you right handed or left handed? Right handed   Are you currently employed ? No retired   What is your current occupation?   Do you live at home alone? No   Who lives with you? Lives with husband.    What type of home do you live in: 1 story or 2 story? Two story home.       Social Determinants of Health   Financial Resource Strain: Not on file  Food Insecurity: No Food Insecurity (01/25/2022)   Hunger Vital Sign    Worried About Running Out of Food in the Last Year: Never true    Ran Out of Food in the Last Year: Never true  Transportation Needs: No Transportation Needs (01/25/2022)   PRAPARE - Administrator, Civil Service (Medical): No    Lack of Transportation (Non-Medical): No  Physical Activity: Not on file  Stress: Not on file  Social Connections: Not on file   Allergies  Allergen Reactions   Meloxicam Other (See Comments)    Made patient have stomach pain ,sore throat, joint pains, face flush    Molds & Smuts Other (See Comments), Palpitations, Shortness Of Breath and Swelling   Cefixime Swelling   Celebrex [Celecoxib] Swelling   Cobalt Other (See Comments)    Per allergist   Gabapentin Other (See Comments)    Joint pain   Levofloxacin Other (See Comments)    Muscle and joint pain   Molybdenum Other (See Comments)    Per allergist   Other Other (See Comments)    TEQUIN. TEQUIN - JOINT AND MUSCLE PAIN Other Reaction: painful joints TEQUIN.  TEQUIN - JOINT AND MUSCLE PAIN Tantal Metal   Penicillins Hives   Robaxin [Methocarbamol]    Tomato Hives   Adhesive [Tape] Rash    Sensitive to EKG leads; sensitive to 70M Micropore tape   Dust Mite Extract Itching, Other (See Comments) and Palpitations   Tetracycline Hcl Rash    Can take Doxy   Tetracyclines & Related Rash   Family History  Problem Relation Age of Onset   Rheumatologic disease Mother    Diabetes Mother  Heart attack Father    Rheumatologic disease Sister    Arthritis Sister        --Rheumatoid arthritis   Ovarian cancer Sister 69   Crohn's disease Brother    Kidney failure Brother    Ulcerative colitis Brother    Glaucoma Brother      Current Outpatient Medications (Cardiovascular):    amLODipine (NORVASC) 2.5 MG tablet, Take 1 tablet (2.5 mg total) by mouth daily.   carvedilol (COREG) 12.5 MG tablet, Take 1 tablet (12.5 mg total) by mouth 2 (two) times daily with a meal.   ezetimibe (ZETIA) 10 MG tablet, Take 1 tablet (10 mg total) by mouth daily.   nitroGLYCERIN (NITROSTAT) 0.4 MG SL tablet, Place 1 tablet (0.4 mg total) under the tongue every 5 (five) minutes x 3 doses as needed for chest pain.   rosuvastatin (CRESTOR) 20 MG tablet, Take 1 tablet (20 mg total) by mouth daily.  Current Outpatient Medications (Respiratory):    budesonide-formoterol (SYMBICORT) 160-4.5 MCG/ACT inhaler, Inhale 2 puffs into the lungs 2 (two) times daily.   Misc Natural Product Nasal (NASAL CLEANSE RINSE MIX NA), Place into the nose. Xlear rinse   Sodium Chloride-Xylitol (XLEAR SINUS CARE SPRAY NA), Place into the nose. 2 sprays at bedtime  Current Outpatient Medications (Analgesics):    aspirin EC 81 MG tablet, Take 1 tablet (81 mg total) by mouth daily. Swallow whole.  Current Outpatient Medications (Hematological):    clopidogrel (PLAVIX) 75 MG tablet, Take 1 tablet (75 mg total) by mouth daily.   cyanocobalamin (VITAMIN B12) 1000 MCG tablet, Take 1,000 mcg by mouth  every Monday, Wednesday, and Friday.   cyanocobalamin (VITAMIN B12) 1000 MCG/ML injection, Inject 1,000 mcg into the muscle once. Once a month  Current Outpatient Medications (Other):    ascorbic acid (VITAMIN C) 250 MG tablet, Take 250 mg by mouth daily.   carboxymethylcellulose (REFRESH PLUS) 0.5 % SOLN, 1 drop 2 (two) times daily as needed (for dry eyes).   Cholecalciferol (VITAMIN D3) 5000 units CAPS, Take 5,000 Units by mouth. Every other day   CINNAMON PO, Take 2.5 mLs by mouth daily.   Coenzyme Q10 200 MG capsule, Take 200 mg by mouth daily.   COLLAGEN PO, Take by mouth. Collagen Peptides. 1 scoop   Menatetrenone (VITAMIN K2) 100 MCG TABS, Take 180 mcg by mouth. Every 6th day. MK-7   Misc Natural Products (TART CHERRY ADVANCED PO), Take 2 tablets by mouth daily.   MISC NATURAL PRODUCTS PO, Take by mouth. Black currant seed oil   Multiple Vitamins-Minerals (SUPER MULTI-VITAMIN PO), Take 1 capsule by mouth daily. Solgar Formula VM-75   NON FORMULARY, Take 3 fluid ounces by mouth daily. Goat Kiefer   Omega-3 Fatty Acids (FISH OIL) 500 MG CAPS, Take 500 mg by mouth daily.   Probiotic Product (PROBIOTIC ADVANCED PO), Take by mouth. Patient states taking 5 billion, one capsule daily. Suprema Dophilus probiotic   Reviewed prior external information including notes and imaging from  primary care provider As well as notes that were available from care everywhere and other healthcare systems.  Past medical history, social, surgical and family history all reviewed in electronic medical record.  No pertanent information unless stated regarding to the chief complaint.   Review of Systems:  No headache, visual changes, nausea, vomiting, diarrhea, constipation, dizziness, abdominal pain, skin rash, fevers, chills, night sweats, weight loss, swollen lymph nodes, body aches, joint swelling, chest pain, shortness of breath, mood changes. POSITIVE muscle aches  Objective  Last menstrual period  08/16/1992.   General: No apparent distress alert and oriented x3 mood and affect normal, dressed appropriately.  HEENT: Pupils equal, extraocular movements intact  Respiratory: Patient's speak in full sentences and does not appear short of breath  Cardiovascular: No lower extremity edema, non tender, no erythema      Impression and Recommendations:

## 2022-05-10 ENCOUNTER — Ambulatory Visit (INDEPENDENT_AMBULATORY_CARE_PROVIDER_SITE_OTHER): Payer: Medicare Other | Admitting: Family Medicine

## 2022-05-10 VITALS — BP 122/70 | HR 58 | Ht 60.0 in | Wt 235.0 lb

## 2022-05-10 DIAGNOSIS — M9902 Segmental and somatic dysfunction of thoracic region: Secondary | ICD-10-CM | POA: Diagnosis not present

## 2022-05-10 DIAGNOSIS — M9903 Segmental and somatic dysfunction of lumbar region: Secondary | ICD-10-CM

## 2022-05-10 DIAGNOSIS — M999 Biomechanical lesion, unspecified: Secondary | ICD-10-CM

## 2022-05-10 DIAGNOSIS — G894 Chronic pain syndrome: Secondary | ICD-10-CM

## 2022-05-10 DIAGNOSIS — M5416 Radiculopathy, lumbar region: Secondary | ICD-10-CM

## 2022-05-10 DIAGNOSIS — M5030 Other cervical disc degeneration, unspecified cervical region: Secondary | ICD-10-CM | POA: Diagnosis not present

## 2022-05-10 DIAGNOSIS — M545 Low back pain, unspecified: Secondary | ICD-10-CM | POA: Diagnosis not present

## 2022-05-10 DIAGNOSIS — G8929 Other chronic pain: Secondary | ICD-10-CM

## 2022-05-10 DIAGNOSIS — M9904 Segmental and somatic dysfunction of sacral region: Secondary | ICD-10-CM

## 2022-05-10 DIAGNOSIS — M9901 Segmental and somatic dysfunction of cervical region: Secondary | ICD-10-CM

## 2022-05-10 DIAGNOSIS — M9908 Segmental and somatic dysfunction of rib cage: Secondary | ICD-10-CM

## 2022-05-10 DIAGNOSIS — M170 Bilateral primary osteoarthritis of knee: Secondary | ICD-10-CM

## 2022-05-10 MED ORDER — DULOXETINE HCL 20 MG PO CPEP: 20.0000 mg | ORAL_CAPSULE | Freq: Every day | ORAL | 0 refills | Status: DC

## 2022-05-10 NOTE — Patient Instructions (Addendum)
20 mg of Cymbalta daily will help out  Warm bath and massaging the toe  6 week

## 2022-05-11 ENCOUNTER — Encounter: Payer: Self-pay | Admitting: Family Medicine

## 2022-05-11 NOTE — Assessment & Plan Note (Signed)
Low back exam  Does have some loss of lordosis  Tried OMT  Discussed posture and ergonomics again.  Discussed which activities to do and which ones to avoid.  I do believe that there is low likelihood of a seronegative rheumatoid arthritis.  Continues to have significant discomfort and pain and I do feel at this time Cymbalta could be beneficial.  Patient has agreed to try it.  We will see how patient responds.  Has had many difficulties with intolerances or allergies to medications so we will see.  Hopeful though that we can give patient some improvement in her pain and improve her daily function.

## 2022-05-11 NOTE — Assessment & Plan Note (Signed)
Restart aquatic therapy for back pain and other musculoskeletal complaints due to patient's physical limitations unable to do pulmonary and cardio rehabilitation appropriately

## 2022-05-11 NOTE — Assessment & Plan Note (Signed)
Started cymbalta with worsening symptoms

## 2022-05-11 NOTE — Assessment & Plan Note (Signed)
   Decision today to treat with OMT was based on Physical Exam  After verbal consent patient was treated with  ME, techniques in cervical, thoracic, rib, lumbar and sacral areas, all areas are chronic   Patient tolerated the procedure well with improvement in symptoms  Patient given exercises, stretches and lifestyle modifications  See medications in patient instructions if given  Patient will follow up in 4-8 weeks

## 2022-05-15 DIAGNOSIS — M545 Low back pain, unspecified: Secondary | ICD-10-CM | POA: Diagnosis not present

## 2022-05-15 DIAGNOSIS — M25561 Pain in right knee: Secondary | ICD-10-CM | POA: Diagnosis not present

## 2022-05-16 ENCOUNTER — Encounter: Payer: Self-pay | Admitting: Obstetrics and Gynecology

## 2022-05-16 ENCOUNTER — Ambulatory Visit (INDEPENDENT_AMBULATORY_CARE_PROVIDER_SITE_OTHER): Payer: Medicare Other | Admitting: Obstetrics and Gynecology

## 2022-05-16 VITALS — BP 176/79 | HR 74 | Ht 58.66 in | Wt 229.0 lb

## 2022-05-16 DIAGNOSIS — R159 Full incontinence of feces: Secondary | ICD-10-CM

## 2022-05-16 DIAGNOSIS — N393 Stress incontinence (female) (male): Secondary | ICD-10-CM | POA: Diagnosis not present

## 2022-05-16 DIAGNOSIS — N3281 Overactive bladder: Secondary | ICD-10-CM

## 2022-05-16 DIAGNOSIS — R350 Frequency of micturition: Secondary | ICD-10-CM | POA: Diagnosis not present

## 2022-05-16 LAB — POCT URINALYSIS DIPSTICK
Bilirubin, UA: NEGATIVE
Blood, UA: NEGATIVE
Glucose, UA: NEGATIVE
Ketones, UA: NEGATIVE
Leukocytes, UA: NEGATIVE
Nitrite, UA: NEGATIVE
Protein, UA: NEGATIVE
Spec Grav, UA: 1.02 (ref 1.010–1.025)
Urobilinogen, UA: 0.2 E.U./dL
pH, UA: 7 (ref 5.0–8.0)

## 2022-05-16 NOTE — Patient Instructions (Addendum)
Accidental Bowel Leakage: Our goal is to achieve formed bowel movements daily or every-other-day without leakage.  You may need to try different combinations of the following options to find what works best for you.  Some management options include: Dietary changes (more leafy greens, vegetables and fruits; less processed foods) Fiber supplementation (Metamucil or something with psyllium as active ingredient) Over-the-counter imodium (tablets or liquid) to help solidify the stool and prevent leakage of stool.   For treatment of stress urinary incontinence, which is leakage with physical activity/movement/strainging/coughing, we discussed expectant management versus nonsurgical options versus surgery. Nonsurgical options include weight loss, physical therapy, as well as a pessary.  Surgical options include a midurethral sling, which is a synthetic mesh sling that acts like a hammock under the urethra to prevent leakage of urine,  and transurethral injection of a bulking agent.  We discussed the symptoms of overactive bladder (OAB), which include urinary urgency, urinary frequency, night-time urination, with or without urge incontinence.  We discussed management including behavioral therapy (decreasing bladder irritants by following a bladder diet, urge suppression strategies, timed voids, bladder retraining), physical therapy, medication; and for refractory cases posterior tibial nerve stimulation, sacral neuromodulation, and intravesical botulinum toxin injection.

## 2022-05-16 NOTE — Progress Notes (Signed)
[facility] New Patient Evaluation and Consultation  Referring Provider: Janean Sark* PCP: Street, [name], MD Date of Service: 05/16/2022  SUBJECTIVE Chief Complaint: New Patient (Initial Visit) (Kerry [name] is a 72 y.o. female here for a consult for incontinence and urgency)  History of Present Illness: Kerry [name] is a 72 y.o. White or Caucasian female seen in consultation at the request of Dr. Ardell Isaacs for evaluation of incontinence.    Review of records significant for: Status post hysteroscopy with Myosure resection of endometrial masses, dilation and curettage.  Has seen [facility] for incontinence and has done pelvic PT   Urinary Symptoms: Leaks urine with cough/ sneeze, exercise, lifting, going from sitting to standing, with a full bladder, with movement to the bathroom, and with urgency Leaks multiple time(s) per day. UUI = SUI Has a history of cystoscopy in 2015 and symptoms worsened afte rthis.  Pad use:  multiple  pads per day.   She is bothered by her UI symptoms. Has been seen by [facility] and did pelvic PT but she did not see a physician there.  Has never been on medication. Has gained about 30 lbs in the last year.   Day time voids- every 2-3 hours.  Nocturia: 0-1 times per night to void. Voiding dysfunction: she does not empty her bladder well.  does not use a catheter to empty bladder.  When urinating, she feels dribbling after finishing and the need to urinate multiple times in a row Drinks: water, milk. Denies coffee or soda  UTIs:  0  UTI's in the last year.   Denies history of blood in urine and kidney or bladder stones  Pelvic Organ Prolapse Symptoms:                  She Admits to a feeling of a bulge the vaginal area. Bulge not always present.  She Denies seeing a bulge.  This bulge is bothersome.  Bowel Symptom: Bowel movements: 1 time(s) per day Stool consistency: soft   Straining: no.  Splinting: no.  Incomplete evacuation: no.  She Admits to accidental bowel leakage / fecal incontinence  Occurs: a few times after starting some new medications (twice)  Consistency with leakage: liquid Bowel regimen: diet Last colonoscopy: Date 03/2021- some polyps removed  Sexual Function Sexually active: no.  Sexual orientation:  heterosexual Pain with sex: Yes, at the vaginal opening, deep in the pelvis  Pelvic Pain Denies pelvic pain   Past Medical History:  Past Medical History:  Diagnosis Date   Abnormal uterine bleeding    Allergic rhinitis, cause unspecified    Arthritis of knee, right    Bursitis of left shoulder    Cherry hemangioma 07/28/2015   vulva   Chronic pain    Coronary artery disease    Dyspareunia    Elevated cholesterol    Elevated uric acid in blood 2021   Fibroid    Foot fracture, left 01/2015   and torn tendons   GERD (gastroesophageal reflux disease)    HTN (hypertension)    Interstitial lung disease (HCC)    Kidney stones    Sleep apnea    Urinary incontinence      Past Surgical History:   Past Surgical History:  Procedure Laterality Date    dilatation and curettage  2000   CARPAL TUNNEL RELEASE  1998   CARPAL TUNNEL RELEASE Left 05/21/2020   CATARACT EXTRACTION Bilateral    CORONARY STENT  INTERVENTION N/A 01/26/2022   Procedure: CORONARY STENT INTERVENTION;  Surgeon: Kathleene Hazel, MD;  Location: [facility];  Service: Cardiovascular;  Laterality: N/A;   dental implants  2010   DENTAL SURGERY     DILATATION & CURETTAGE/HYSTEROSCOPY WITH MYOSURE N/A 08/22/2021   Procedure: DILATATION & CURETTAGE/HYSTEROSCOPY WITH MYOSURE RESECTION OF ENDOMETRIAL MASS;  Surgeon: Patton Salles, MD;  Location: [facility];  Service: Gynecology;  Laterality: N/A;   ESOPHAGEAL DILATION  2010   HAMMER TOE SURGERY     rt   LEFT HEART CATH AND CORONARY ANGIOGRAPHY N/A 01/25/2022   Procedure: LEFT  HEART CATH AND CORONARY ANGIOGRAPHY;  Surgeon: Lennette Bihari, MD;  Location: [facility];  Service: Cardiovascular;  Laterality: N/A;   OPERATIVE ULTRASOUND N/A 08/22/2021   Procedure: OPERATIVE ULTRASOUND GUIDED HYSTEROSCOPY;  Surgeon: Patton Salles, MD;  Location: [facility];  Service: Gynecology;  Laterality: N/A;   PELVIC LAPAROSCOPY  1990   ROTATOR CUFF REPAIR Right 09/2011   Dr. Ave Filter   ROTATOR CUFF REPAIR Left 11/11/2013   Dr. Ave Filter    SEPTOPLASTY     UMBILICAL HERNIA REPAIR  2010   Patient reports mesh was used for the repair.     Past OB/GYN History: OB History  Gravida Para Term Preterm AB Living  2 2 2     2   SAB IAB Ectopic Multiple Live Births          2    # Outcome Date GA Lbr Len/2nd Weight Sex Delivery Anes PTL Lv  2 Term      Vag-Spont     1 Term      Vag-Forceps      Menopausal: Denies vaginal bleeding recently- had myosure hysteroscopy last year.  Last pap smear was 2022- negative.     Medications: She has a current medication list which includes the following prescription(s): amlodipine, ascorbic acid, aspirin ec, budesonide-formoterol, carboxymethylcellulose, carvedilol, vitamin d3, cinnamon, clopidogrel, coenzyme q10, collagen, cyanocobalamin, cyanocobalamin, duloxetine, vitamin k2, misc natural product nasal, misc natural products, misc natural products, multiple vitamins-minerals, nitroglycerin, NON FORMULARY, fish oil, probiotic product, rosuvastatin, sodium chloride-xylitol, and ezetimibe.   Allergies: Patient is allergic to meloxicam, molds & smuts, cefixime, celebrex [celecoxib], cobalt, gabapentin, levofloxacin, molybdenum, other, penicillins, robaxin [methocarbamol], tomato, adhesive [tape], dust mite extract, tetracycline hcl, and tetracyclines & related.   Social History:  Social History   Tobacco Use   Smoking status: Never   Smokeless tobacco: Never  Vaping Use   Vaping Use: Never used  Substance Use  Topics   Alcohol use: No    Alcohol/week: 0.0 standard drinks of alcohol   Drug use: No    Relationship status: married She lives with her husband.   She is not employed. Regular exercise: No History of abuse: No  Family History:   Family History  Problem Relation Age of Onset   Rheumatologic disease Mother    Diabetes Mother    Heart attack Father    Rheumatologic disease Sister    Arthritis Sister        --Rheumatoid arthritis   Ovarian cancer Sister 43   Crohn's disease Brother    Kidney failure Brother    Ulcerative colitis Brother    Glaucoma Brother      Review of Systems: Review of Systems  Constitutional:  Positive for malaise/fatigue. Negative for fever and weight loss.  Respiratory:  Positive for shortness of breath and wheezing.  Negative for cough.   Cardiovascular:  Positive for leg swelling. Negative for chest pain and palpitations.  Gastrointestinal:  Positive for abdominal pain. Negative for blood in stool.  Genitourinary:  Negative for dysuria.  Musculoskeletal:  Negative for myalgias.  Skin:  Negative for rash.  Neurological:  Positive for dizziness and headaches.  Endo/Heme/Allergies:  Bruises/bleeds easily.  Psychiatric/Behavioral:  Negative for depression. The patient is not nervous/anxious.      OBJECTIVE Physical Exam: Vitals:   05/16/22 1430 05/16/22 1435  BP: (!) 180/98 (!) 176/79  Pulse: 92 74  Weight: 229 lb (103.9 kg)   Height: 4' 10.66" (1.49 m)     Physical Exam Constitutional:      General: She is not in acute distress.    Appearance: She is obese.  Pulmonary:     Effort: Pulmonary effort is normal.  Abdominal:     General: There is no distension.     Palpations: Abdomen is soft.     Tenderness: There is no abdominal tenderness. There is no rebound.  Musculoskeletal:        General: No swelling. Normal range of motion.  Skin:    General: Skin is warm and dry.     Findings: No rash.  Neurological:     Mental Status: She  is alert and oriented to person, place, and time.     Gait: Gait normal.  Psychiatric:        Mood and Affect: Mood normal.        Behavior: Behavior normal.      GU / Detailed Urogynecologic Evaluation:  Pelvic Exam: Normal external female genitalia; Bartholin's and Skene's glands normal in appearance; urethral meatus normal in appearance, no urethral masses or discharge.   Large amount of leakage with valsalva   Speculum exam reveals normal vaginal mucosa with atrophy. Cervix normal appearance. Uterus and adnexa not palpable due to body habitus   Pelvic floor strength II/V, puborectalis III/V external anal sphincter IV/V  Pelvic floor musculature: Right levator non-tender, Right obturator non-tender, Left levator non-tender, Left obturator non-tender  POP-Q:   POP-Q  -3                                            Aa   -3                                           Ba  -8                                              C   4                                            Gh  5                                            Pb  9  tvl   -1.5                                            Ap  -1.5                                            Bp  -9                                              D      Rectal Exam:  Normal sphincter tone, no distal rectocele, enterocoele not present, no rectal masses, no sign of dyssynergia when asking the patient to bear down.  Post-Void Residual (PVR) by Bladder Scan: In order to evaluate bladder emptying, we discussed obtaining a postvoid residual and she agreed to this procedure.  Procedure: The ultrasound unit was placed on the patient's abdomen in the suprapubic region after the patient had voided. A PVR of 161 ml was obtained by bladder scan.  Laboratory Results: POC urine: negative   ASSESSMENT AND PLAN Ms. Filosa is a 72 y.o. with:  1. SUI (stress urinary incontinence, female)   2. Overactive  bladder   3. Urinary frequency   4. Incontinence of feces, unspecified fecal incontinence type    SUI - For treatment of stress urinary incontinence,  non-surgical options include expectant management, weight loss, physical therapy, as well as a pessary.  Surgical options include a midurethral sling, Burch urethropexy, and transurethral injection of a bulking agent. - Not a good surgical candidate right now due to recent stent placement but could consider urethral bulking in office (would likely need to hold plavix).  - We discussed option of a pessary and she will return for a fitting.   2. OAB - We discussed the symptoms of overactive bladder (OAB), which include urinary urgency, urinary frequency, nocturia, with or without urge incontinence.  While we do not know the exact etiology of OAB, several treatment options exist. We discussed management including behavioral therapy (decreasing bladder irritants, urge suppression strategies, timed voids, bladder retraining), physical therapy, medication; for refractory cases posterior tibial nerve stimulation, sacral neuromodulation, and intravesical botulinum toxin injection.  - We discussed with a borderline PVR, would want urodynamics prior to starting medication due to risk of worsening urinary retention. However, she does not feel medication is right for her since she has been started on a lot of new medications recently.  - She is interested in PTNS but would not want to start until after the summer. Handout provided.   3. Accidental Bowel Leakage:  - Treatment options include anti-diarrhea medication (loperamide/ Imodium OTC or prescription lomotil), fiber supplements, physical therapy, and possible sacral neuromodulation or surgery.   - Recommended daily fiber supplement with psyllium for stool bulking but she feels it has not been an issue for her lately so may not start it now.   Return for pessary fitting   Marguerita Beards,  MD

## 2022-05-29 ENCOUNTER — Encounter (HOSPITAL_BASED_OUTPATIENT_CLINIC_OR_DEPARTMENT_OTHER): Payer: Medicare Other

## 2022-05-29 ENCOUNTER — Encounter (HOSPITAL_BASED_OUTPATIENT_CLINIC_OR_DEPARTMENT_OTHER): Payer: Self-pay

## 2022-05-29 ENCOUNTER — Ambulatory Visit (HOSPITAL_BASED_OUTPATIENT_CLINIC_OR_DEPARTMENT_OTHER): Payer: Medicare Other | Admitting: Pulmonary Disease

## 2022-05-30 ENCOUNTER — Ambulatory Visit: Payer: Medicare Other | Admitting: Internal Medicine

## 2022-05-30 DIAGNOSIS — M25561 Pain in right knee: Secondary | ICD-10-CM | POA: Diagnosis not present

## 2022-05-30 DIAGNOSIS — M545 Low back pain, unspecified: Secondary | ICD-10-CM | POA: Diagnosis not present

## 2022-06-02 DIAGNOSIS — M545 Low back pain, unspecified: Secondary | ICD-10-CM | POA: Diagnosis not present

## 2022-06-02 DIAGNOSIS — M25561 Pain in right knee: Secondary | ICD-10-CM | POA: Diagnosis not present

## 2022-06-05 DIAGNOSIS — M545 Low back pain, unspecified: Secondary | ICD-10-CM | POA: Diagnosis not present

## 2022-06-05 DIAGNOSIS — M25561 Pain in right knee: Secondary | ICD-10-CM | POA: Diagnosis not present

## 2022-06-07 ENCOUNTER — Ambulatory Visit: Payer: Medicare Other | Attending: Internal Medicine

## 2022-06-07 DIAGNOSIS — E785 Hyperlipidemia, unspecified: Secondary | ICD-10-CM

## 2022-06-07 DIAGNOSIS — I10 Essential (primary) hypertension: Secondary | ICD-10-CM

## 2022-06-07 DIAGNOSIS — I2510 Atherosclerotic heart disease of native coronary artery without angina pectoris: Secondary | ICD-10-CM

## 2022-06-07 DIAGNOSIS — R0609 Other forms of dyspnea: Secondary | ICD-10-CM | POA: Diagnosis not present

## 2022-06-07 DIAGNOSIS — G4733 Obstructive sleep apnea (adult) (pediatric): Secondary | ICD-10-CM

## 2022-06-07 DIAGNOSIS — Z136 Encounter for screening for cardiovascular disorders: Secondary | ICD-10-CM | POA: Diagnosis not present

## 2022-06-07 DIAGNOSIS — R0600 Dyspnea, unspecified: Secondary | ICD-10-CM | POA: Diagnosis not present

## 2022-06-08 LAB — HEPATIC FUNCTION PANEL
ALT: 19 IU/L (ref 0–32)
AST: 19 IU/L (ref 0–40)
Albumin: 4.2 g/dL (ref 3.8–4.8)
Alkaline Phosphatase: 47 IU/L (ref 44–121)
Bilirubin Total: 0.2 mg/dL (ref 0.0–1.2)
Bilirubin, Direct: <0.1 mg/dL (ref 0.00–0.40)
Total Protein: 6.6 g/dL (ref 6.0–8.5)

## 2022-06-08 LAB — NMR, LIPOPROFILE
Cholesterol, Total: 146 mg/dL (ref 100–199)
HDL Particle Number: 36.6 umol/L
HDL-C: 52 mg/dL
LDL Particle Number: 974 nmol/L
LDL Size: 20.3 nm — ABNORMAL LOW
LDL-C (NIH Calc): 75 mg/dL (ref 0–99)
LP-IR Score: 56 — ABNORMAL HIGH
Small LDL Particle Number: 603 nmol/L — ABNORMAL HIGH
Triglycerides: 101 mg/dL (ref 0–149)

## 2022-06-09 ENCOUNTER — Telehealth: Payer: Self-pay

## 2022-06-09 NOTE — Telephone Encounter (Signed)
Left message for return call for lab results. 

## 2022-06-12 ENCOUNTER — Encounter: Payer: Self-pay | Admitting: Obstetrics and Gynecology

## 2022-06-13 ENCOUNTER — Telehealth: Payer: Self-pay

## 2022-06-13 DIAGNOSIS — M545 Low back pain, unspecified: Secondary | ICD-10-CM | POA: Diagnosis not present

## 2022-06-13 DIAGNOSIS — E785 Hyperlipidemia, unspecified: Secondary | ICD-10-CM

## 2022-06-13 DIAGNOSIS — I2510 Atherosclerotic heart disease of native coronary artery without angina pectoris: Secondary | ICD-10-CM

## 2022-06-13 DIAGNOSIS — M25561 Pain in right knee: Secondary | ICD-10-CM | POA: Diagnosis not present

## 2022-06-13 MED ORDER — ROSUVASTATIN CALCIUM 40 MG PO TABS
40.0000 mg | ORAL_TABLET | Freq: Every day | ORAL | 3 refills | Status: DC
  Filled 2022-07-19: qty 90, 90d supply, fill #0

## 2022-06-13 NOTE — Telephone Encounter (Signed)
Lab results sent to her My Chart and LMTCB.Marland Kitchen Need lab date.

## 2022-06-13 NOTE — Telephone Encounter (Signed)
-----   Message from Dietrich Pates V, MD sent at 06/08/2022 10:35 PM EDT ----- LDL is almost at goal Continue Zetia I would recomm increasing Crestor to 40 mg daily Follow up lipomed in 4 months

## 2022-06-14 ENCOUNTER — Ambulatory Visit: Payer: Medicare Other | Admitting: Obstetrics and Gynecology

## 2022-06-15 DIAGNOSIS — M545 Low back pain, unspecified: Secondary | ICD-10-CM | POA: Diagnosis not present

## 2022-06-15 DIAGNOSIS — M25561 Pain in right knee: Secondary | ICD-10-CM | POA: Diagnosis not present

## 2022-06-16 ENCOUNTER — Other Ambulatory Visit: Payer: Self-pay | Admitting: Family Medicine

## 2022-06-19 NOTE — Telephone Encounter (Signed)
Left a message for the pt to call back.  

## 2022-06-20 DIAGNOSIS — M25561 Pain in right knee: Secondary | ICD-10-CM | POA: Diagnosis not present

## 2022-06-20 DIAGNOSIS — M545 Low back pain, unspecified: Secondary | ICD-10-CM | POA: Diagnosis not present

## 2022-06-21 NOTE — Progress Notes (Signed)
Kerry [name] [address] [place] [address] Phone: [phone] Subjective:   [name], am serving as a scribe for Dr. Antoine Primas.  I'm seeing this patient by the request  of:  Street, [name], MD  CC: Back pain   [id number]  Kerry [name] is a 72 y.o. female coming in with complaint of back and neck pain. OMT on 05/10/2022. Patient states does have pain noted, has started the the cymbalta and was increasing. Doing well with the pain but did notice some fatigue.  Has notice improvement with the fatigue some.    Medications patient has been prescribed: Cymbalta  Taking:yes          Reviewed prior external information including notes and imaging from previsou exam, outside providers and external EMR if available.   As well as notes that were available from care everywhere and other healthcare systems.  Past medical history, social, surgical and family history all reviewed in electronic medical record.  No pertanent information unless stated regarding to the chief complaint.   Past Medical History:  Diagnosis Date   Abnormal uterine bleeding    Allergic rhinitis, cause unspecified    Arthritis of knee, right    Bursitis of left shoulder    Cherry hemangioma 07/28/2015   vulva   Chronic pain    Coronary artery disease    Dyspareunia    Elevated cholesterol    Elevated uric acid in blood 2021   Fibroid    Foot fracture, left 01/2015   and torn tendons   GERD (gastroesophageal reflux disease)    HTN (hypertension)    Interstitial lung disease (HCC)    Kidney stones    Sleep apnea    Urinary incontinence     Allergies  Allergen Reactions   Meloxicam Other (See Comments)    Made patient have stomach pain ,sore throat, joint pains, face flush    Molds & Smuts Other (See Comments), Palpitations, Shortness Of Breath and Swelling   Cefixime Swelling   Celebrex [Celecoxib] Swelling   Cobalt Other (See  Comments)    Per allergist   Gabapentin Other (See Comments)    Joint pain   Levofloxacin Other (See Comments)    Muscle and joint pain   Molybdenum Other (See Comments)    Per allergist   Other Other (See Comments)    TEQUIN. TEQUIN - JOINT AND MUSCLE PAIN Other Reaction: painful joints TEQUIN. TEQUIN - JOINT AND MUSCLE PAIN Tantal Metal   Penicillins Hives   Robaxin [Methocarbamol]    Tomato Hives   Adhesive [Tape] Rash    Sensitive to EKG leads; sensitive to 56M Micropore tape   Dust Mite Extract Itching, Other (See Comments) and Palpitations   Tetracycline Hcl Rash    Can take Doxy   Tetracyclines & Related Rash     Review of Systems:  No headache, visual changes, nausea, vomiting, diarrhea, constipation, dizziness, abdominal pain, skin rash, fevers, chills, night sweats, weight loss, swollen lymph nodes, body aches, joint swelling, chest pain, shortness of breath, mood changes. POSITIVE muscle aches  Objective  Height 4\' 10"  (1.473 m), weight 232 lb (105.2 kg), last menstrual period 08/16/1992.   General: No apparent distress alert and oriented x3 mood and affect normal, dressed appropriately.  HEENT: Pupils equal, extraocular movements intact  Respiratory: Patient's speak in full sentences and does not appear short of breath  Cardiovascular: No lower extremity edema, non tender, no erythema  Back  exam does have significant loss of lordosis. Assessment difficulty with range of motion in all planes.  Tightness of the neck. Mild improvement in gait noted as well.  Low back does have significant loss of lordosis as well.   Osteopathic findings  C5 flexed rotated and side bent left T3 extended rotated and side bent right inhaled rib T9 extended rotated and side bent left L2 flexed rotated and side bent right Sacrum right on right    Assessment and Plan:  Chronic pain Improving with the cymbalta. ? Fatigue though. Discuss with patient though first time in the long  time she has made told her to maybe change timing and see if it helps. Otherwise still would like to go up on the dose. Could make significant improvement if we can titrate up to 40 or 60 mg.  Responds some to OMT, continue other modalities.  RTC in 6 weeks     Nonallopathic problems  Decision today to treat with OMT was based on Physical Exam  After verbal consent patient was treated with HVLA, ME, FPR techniques in cervical, rib, thoracic, lumbar, and sacral  areas  Patient tolerated the procedure well with improvement in symptoms  Patient given exercises, stretches and lifestyle modifications  See medications in patient instructions if given  Patient will follow up in 4-8 weeks     The above documentation has been reviewed and is accurate and complete Judi Saa, DO         Note: This dictation was prepared with Dragon dictation along with smaller phrase technology. Any transcriptional errors that result from this process are unintentional.

## 2022-06-22 ENCOUNTER — Ambulatory Visit (INDEPENDENT_AMBULATORY_CARE_PROVIDER_SITE_OTHER): Payer: Medicare Other | Admitting: Family Medicine

## 2022-06-22 ENCOUNTER — Encounter: Payer: Self-pay | Admitting: Internal Medicine

## 2022-06-22 VITALS — Ht <= 58 in | Wt 232.0 lb

## 2022-06-22 DIAGNOSIS — G894 Chronic pain syndrome: Secondary | ICD-10-CM

## 2022-06-22 DIAGNOSIS — M9903 Segmental and somatic dysfunction of lumbar region: Secondary | ICD-10-CM

## 2022-06-22 DIAGNOSIS — M9901 Segmental and somatic dysfunction of cervical region: Secondary | ICD-10-CM | POA: Diagnosis not present

## 2022-06-22 DIAGNOSIS — M9908 Segmental and somatic dysfunction of rib cage: Secondary | ICD-10-CM

## 2022-06-22 DIAGNOSIS — M9904 Segmental and somatic dysfunction of sacral region: Secondary | ICD-10-CM | POA: Diagnosis not present

## 2022-06-22 DIAGNOSIS — M9902 Segmental and somatic dysfunction of thoracic region: Secondary | ICD-10-CM

## 2022-06-22 NOTE — Patient Instructions (Signed)
Change timing of Cymbalta I will get back to you if I see any other reactions See me in 6-8 weeks

## 2022-06-23 NOTE — Telephone Encounter (Signed)
See pts extensive My Chart encounter... will follow up with her at her OV 07/18/22.

## 2022-06-26 ENCOUNTER — Encounter: Payer: Self-pay | Admitting: Family Medicine

## 2022-06-26 NOTE — Assessment & Plan Note (Signed)
Improving with the cymbalta. ? Fatigue though. Discuss with patient though first time in the long time she has made told her to maybe change timing and see if it helps. Otherwise still would like to go up on the dose. Could make significant improvement if we can titrate up to 40 or 60 mg.  Responds some to OMT, continue other modalities.  RTC in 6 weeks

## 2022-06-28 ENCOUNTER — Encounter: Payer: Self-pay | Admitting: Obstetrics and Gynecology

## 2022-06-28 ENCOUNTER — Ambulatory Visit (INDEPENDENT_AMBULATORY_CARE_PROVIDER_SITE_OTHER): Payer: Medicare Other | Admitting: Obstetrics and Gynecology

## 2022-06-28 VITALS — BP 198/80 | HR 76

## 2022-06-28 DIAGNOSIS — R350 Frequency of micturition: Secondary | ICD-10-CM

## 2022-06-28 DIAGNOSIS — N3281 Overactive bladder: Secondary | ICD-10-CM | POA: Diagnosis not present

## 2022-06-28 DIAGNOSIS — N393 Stress incontinence (female) (male): Secondary | ICD-10-CM

## 2022-06-28 LAB — POCT URINALYSIS DIPSTICK
Bilirubin, UA: NEGATIVE
Blood, UA: NEGATIVE
Glucose, UA: NEGATIVE
Ketones, UA: NEGATIVE
Leukocytes, UA: NEGATIVE
Nitrite, UA: NEGATIVE
Protein, UA: NEGATIVE
Spec Grav, UA: 1.01 (ref 1.010–1.025)
Urobilinogen, UA: 0.2 E.U./dL
pH, UA: 7 (ref 5.0–8.0)

## 2022-06-28 NOTE — Patient Instructions (Signed)
Taking Care of Yourself after Urodynamics  Drink plenty of water for a day or two following your procedure. Try to have about 8 ounces (one cup) at a time, and do this 6 times or more per day unless you have fluid restrictitons AVOID irritative beverages such as coffee, tea, soda, alcoholic or citrus drinks for a day or two, as this may cause burning with urination.  For the first 1-2 days after the procedure, your urine may be pink or red in color. You may have some blood in your urine as a normal side effect of the procedure. Large amounts of bleeding or difficulty urinating are NOT normal. Call the nurse line if this happens or go to the nearest Emergency Room if the bleeding is heavy or you cannot urinate at all and it is after hours. If you had a Bulkamid injection in the urethra and need to be catheterized, ask for a pediatric catheter to be used (size 10 or 12-French) so the material is not pushed out of place.   You may experience some discomfort or a burning sensation with urination after having this procedure. You can use over the counter Azo or pyridium to help with burning and follow the instructions on the packaging. If it does not improve within 1-2 days, or other symptoms appear (fever, chills, or difficulty urinating) call the office to speak to a nurse.  You may return to normal daily activities such as work, school, driving, exercising and housework on the day of the procedure. If your doctor gave you a prescription, take it as ordered.     

## 2022-06-28 NOTE — Progress Notes (Signed)
[facility] Urodynamics Procedure  Referring Physician: Street, [name], * Date of Procedure: 06/28/2022  Kerry [name] is a 72 y.o. female who presents for urodynamic evaluation. Indication(s) for study: SUI, UUI, and OAB  Vital Signs: LMP 08/16/1992   Laboratory Results: A catheterized urine specimen revealed:  POC urine: Negative for all components   Voiding Diary: Not done  Procedure Timeout:  The correct patient was verified and the correct procedure was verified. The patient was in the correct position and safety precautions were reviewed based on at the patient's history.  Urodynamic Procedure A 16F dual lumen urodynamics catheter was placed under sterile conditions into the patient's bladder. A 16F catheter was placed into the rectum in order to measure abdominal pressure. EMG patches were placed in the appropriate position.  All connections were confirmed and calibrations/adjusted made. Saline was instilled into the bladder through the dual lumen catheters.  Cough/valsalva pressures were measured periodically during filling.  Patient was allowed to void.  The bladder was then emptied of its residual.  UROFLOW: Revealed a Qmax of 28.9 mL/sec.  She voided 186 mL and had a residual of 25 mL.  It was a normal pattern and represented normal habits.   CMG: This was performed with sterile water in the sitting position at a fill rate of 30 mL/min.    First sensation of fullness was 72 mLs,  First urge was 164 mLs,  Strong urge was 313 mLs and  Capacity was 648 mLs  Stress incontinence was demonstrated Highest positive CLPP was 49 cmH20 at 392 ml. Highest positive VLPP was 94 cmH20at 392 ml.   Detrusor function was overactive, with phasic contractions seen.  The first occurred at 340 mL to 3.2 cm of water and was associated with urge.  Compliance:  Normal. End fill detrusor pressure was 1.5cmH20.  Calculated compliance was  414mL/cmH20  UPP: MUCP without barrier reduction was 49 cm of water.    MICTURITION STUDY: Voiding was performed without reduction in the sitting position.  Pdet at Qmax was 15 cm of water.  Qmax was 58.5 mL/sec.  It was a interrupted pattern.  She voided 598 mL and had a residual of 50 mL.  It was a volitional void, sustained detrusor contraction was present and abdominal straining was present  EMG: This was performed with patches.  She had involuntary contractions, recruitment with fill was present and urethral sphincter was relaxed with void.  The details of the procedure with the study tracings have been scanned into EPIC.   Urodynamic Impression:  1. Sensation was normal; capacity was normal 2. Stress Incontinence was demonstrated at normal pressures; 3. Detrusor Overactivity was demonstrated with and without leakage. 4. Emptying was normal with a normal PVR, a sustained detrusor contraction present,  abdominal straining present, normal urethral sphincter activity on EMG.  Plan: - The patient will follow up  to discuss the findings and treatment options.

## 2022-06-29 DIAGNOSIS — M545 Low back pain, unspecified: Secondary | ICD-10-CM | POA: Diagnosis not present

## 2022-06-29 DIAGNOSIS — M25561 Pain in right knee: Secondary | ICD-10-CM | POA: Diagnosis not present

## 2022-06-30 ENCOUNTER — Ambulatory Visit (INDEPENDENT_AMBULATORY_CARE_PROVIDER_SITE_OTHER): Payer: Medicare Other | Admitting: Pulmonary Disease

## 2022-06-30 DIAGNOSIS — R0609 Other forms of dyspnea: Secondary | ICD-10-CM

## 2022-06-30 LAB — PULMONARY FUNCTION TEST
DL/VA % pred: 149 %
DL/VA: 6.36 ml/min/mmHg/L
DLCO cor % pred: 108 %
DLCO cor: 18.13 ml/min/mmHg
DLCO unc % pred: 108 %
DLCO unc: 18.13 ml/min/mmHg
FEF 25-75 Post: 1.6 L/sec
FEF 25-75 Pre: 2.75 L/sec
FEF2575-%Change-Post: -41 %
FEF2575-%Pred-Post: 104 %
FEF2575-%Pred-Pre: 179 %
FEV1-%Change-Post: -3 %
FEV1-%Pred-Post: 83 %
FEV1-%Pred-Pre: 86 %
FEV1-Post: 1.5 L
FEV1-Pre: 1.56 L
FEV1FVC-%Change-Post: 0 %
FEV1FVC-%Pred-Pre: 119 %
FEV6-%Change-Post: -4 %
FEV6-%Pred-Post: 72 %
FEV6-%Pred-Pre: 75 %
FEV6-Post: 1.67 L
FEV6-Pre: 1.74 L
FEV6FVC-%Pred-Post: 105 %
FEV6FVC-%Pred-Pre: 105 %
FVC-%Change-Post: -4 %
FVC-%Pred-Post: 69 %
FVC-%Pred-Pre: 72 %
FVC-Post: 1.67 L
FVC-Pre: 1.74 L
Post FEV1/FVC ratio: 90 %
Post FEV6/FVC ratio: 100 %
Pre FEV1/FVC ratio: 90 %
Pre FEV6/FVC Ratio: 100 %
RV % pred: 72 %
RV: 1.48 L
TLC % pred: 82 %
TLC: 3.66 L

## 2022-06-30 NOTE — Progress Notes (Signed)
Full PFT performed today. °

## 2022-06-30 NOTE — Patient Instructions (Signed)
Full PFT performed today. °

## 2022-07-04 DIAGNOSIS — M545 Low back pain, unspecified: Secondary | ICD-10-CM | POA: Diagnosis not present

## 2022-07-04 DIAGNOSIS — M25561 Pain in right knee: Secondary | ICD-10-CM | POA: Diagnosis not present

## 2022-07-05 ENCOUNTER — Encounter (HOSPITAL_BASED_OUTPATIENT_CLINIC_OR_DEPARTMENT_OTHER): Payer: Self-pay | Admitting: Pulmonary Disease

## 2022-07-06 ENCOUNTER — Encounter (HOSPITAL_BASED_OUTPATIENT_CLINIC_OR_DEPARTMENT_OTHER): Payer: Self-pay | Admitting: Pulmonary Disease

## 2022-07-06 ENCOUNTER — Other Ambulatory Visit (HOSPITAL_BASED_OUTPATIENT_CLINIC_OR_DEPARTMENT_OTHER): Payer: Self-pay | Admitting: Pulmonary Disease

## 2022-07-06 ENCOUNTER — Ambulatory Visit (INDEPENDENT_AMBULATORY_CARE_PROVIDER_SITE_OTHER): Payer: Medicare Other | Admitting: Pulmonary Disease

## 2022-07-06 ENCOUNTER — Encounter (HOSPITAL_BASED_OUTPATIENT_CLINIC_OR_DEPARTMENT_OTHER): Payer: Medicare Other

## 2022-07-06 VITALS — BP 124/72 | HR 77 | Ht 60.0 in | Wt 234.0 lb

## 2022-07-06 DIAGNOSIS — E669 Obesity, unspecified: Secondary | ICD-10-CM

## 2022-07-06 DIAGNOSIS — G4733 Obstructive sleep apnea (adult) (pediatric): Secondary | ICD-10-CM | POA: Diagnosis not present

## 2022-07-06 DIAGNOSIS — J849 Interstitial pulmonary disease, unspecified: Secondary | ICD-10-CM | POA: Diagnosis not present

## 2022-07-06 DIAGNOSIS — G4730 Sleep apnea, unspecified: Secondary | ICD-10-CM | POA: Diagnosis not present

## 2022-07-06 DIAGNOSIS — R0609 Other forms of dyspnea: Secondary | ICD-10-CM | POA: Diagnosis not present

## 2022-07-06 MED ORDER — NYSTATIN 100000 UNIT/ML MT SUSP
500000.0000 [IU] | Freq: Four times a day (QID) | OROMUCOSAL | 0 refills | Status: DC
  Filled 2022-07-19: qty 60, 3d supply, fill #0

## 2022-07-06 NOTE — Progress Notes (Addendum)
[facility]  Chief Complaint  Patient presents with   Follow-up    F/U after PFT.     Constitutional:  BP 124/72   Pulse 77   Ht 5' (1.524 m)   Wt 234 lb (106.1 kg)   LMP 08/16/1992   SpO2 97% Comment: on RA  BMI 45.70 kg/m   Past Medical History:  Allergies, GERD, HTN, Nephrolithiasis, Chronic pain  Past Surgical History:  She  has a past surgical history that includes Umbilical hernia repair (2010); Carpal tunnel release (1998); dental implants (2010); Esophageal dilation (2010); Hammer toe surgery; Septoplasty; Rotator cuff repair (Right, 09/2011); Pelvic laparoscopy (1990);  dilatation and curettage (2000); Rotator cuff repair (Left, 11/11/2013); Cataract extraction (Bilateral); Dental surgery; Carpal tunnel release (Left, 05/21/2020); Dilatation & curettage/hysteroscopy with myosure (N/A, 08/22/2021); Operative ultrasound (N/A, 08/22/2021); LEFT HEART CATH AND CORONARY ANGIOGRAPHY (N/A, 01/25/2022); and CORONARY STENT INTERVENTION (N/A, 01/26/2022).  Brief Summary:  Kerry [name] is a 72 y.o. female with obstructive sleep apnea.      Subjective:   She is here with her husband.  PFT was normal.  She isn't sure symbicort is helping.  Not having cough, wheeze, or sputum.  Sleeping okay with CPAP.    She has sinus congestion and post nasal drip.  Her throat feels sore and she loses her voice sometimes.  She doesn't have regular sleep time.  She gets sleep in the afternoon and falls asleep.  She doesn't wear her CPAP when she naps.  Physical Exam:   Appearance - well kempt   ENMT - no sinus tenderness, no oral exudate, no LAN, Mallampati 3 airway, no stridor, wears dentures, changes of thrush in posterior pharynx and soft palate  Respiratory - equal breath sounds bilaterally, no wheezing or rales  CV - s1s2 regular rate and rhythm, no murmurs  Ext - no clubbing, no edema  Skin - no rashes  Psych - normal  mood and affect      Pulmonary Tests:  Serology 02/03/22 >> ANA, ANCA, RF, HP negative PFT 06/30/22 >> FEV1 1.56 ( 86%), FEV1% 90, TLC 3.66 (82%), DLCO 108%  Chest Imaging:  Cardiac CT 12/27/21 >> small scattered nodules up to 6 mm Cardiac PET 01/24/22 >> mosaic pattern of GGO HRCT chest 03/09/22 >> atherosclerosis and coronary calcifications; patchy mild GGO, septal thickening, rare subpleural reticulation b/l (indeterminate UIP); extensive air trapping; collapse of trachea and main bronchi with expiration (tracheobronchomalacia); scattered nodules up to 3 mm, fatty liver   Sleep Tests:  HST 03/04/15 >> AHI 21.6, SpO2 low 80% Auto CPAP 04/07/22 to 07/05/22 >> used on 86 of 90 nights with average 4 hrs 55 min.  Average AHI 0.5 with median CPAP 8 and 95 th percentile CPAP 10 cm H2O  Cardiac Tests:  Echo 04/25/19 >> EF 60 to 65%, mild LVH, grade 2 DD  Social History:  She  reports that she has never smoked. She has never used smokeless tobacco. She reports that she does not drink alcohol and does not use drugs.  Family History:  Her family history includes Arthritis in her sister; Crohn's disease in her brother; Diabetes in her mother; Glaucoma in her brother; Heart attack in her father; Kidney failure in her brother; Ovarian cancer (age of onset: 67) in her sister; Rheumatologic disease in her mother and sister; Ulcerative colitis in her brother.     Assessment/Plan:   Allergic rhinitis with post nasal drip. - prn nasal irrigation, flonase  Thrush. - nystatin swish and swallow for 5 days  Dyspnea on exertion. - likely long COVID, diastolic CHF, and deconditioning - encouraged her to keep up with her pool exercises - can change symbicort to prn  Obstructive sleep apnea. - she is compliant with CPAP and reports benefit from therapy - she uses [facility] for her DME - current CPAP ordered March 2022 - continue auto CPAP 5 to 15 cm H2O  Mild ILD with UIP pattern. -  after COVID 19 infection - PFT normal - monitor clinically  Obesity. - discussed how her weight is contributing to her health issues, including her sleep apnea  Coronary artery disease s/p stenting. - followed by Dr. Charlton Haws with cardiology  Time Spent Involved in Patient Care on Day of Examination:  46 minutes  Follow up:   Patient Instructions  Nystatin 5 ml swish and swallow 4 times daily for 5 days  Use symbicort as needed for cough, wheeze, or chest congestion  Follow up in 4 months  Medication List:   Allergies as of 07/06/2022       Reactions   Meloxicam Other (See Comments)   Made patient have stomach pain ,sore throat, joint pains, face flush    Molds & Smuts Other (See Comments), Palpitations, Shortness Of Breath, Swelling   Cefixime Swelling   Celebrex [celecoxib] Swelling   Cobalt Other (See Comments)   Per allergist   Gabapentin Other (See Comments)   Joint pain   Levofloxacin Other (See Comments)   Muscle and joint pain   Molybdenum Other (See Comments)   Per allergist   Other Other (See Comments)   TEQUIN. TEQUIN - JOINT AND MUSCLE PAIN Other Reaction: painful joints TEQUIN. TEQUIN - JOINT AND MUSCLE PAIN Tantal Metal   Penicillins Hives   Robaxin [methocarbamol]    Tomato Hives   Adhesive [tape] Rash   Sensitive to EKG leads; sensitive to 27M Micropore tape   Dust Mite Extract Itching, Other (See Comments), Palpitations   Tetracycline Hcl Rash   Can take Doxy   Tetracyclines & Related Rash        Medication List        Accurate as of July 06, 2022 11:59 PM. If you have any questions, ask your nurse or doctor.          STOP taking these medications    ezetimibe 10 MG tablet Commonly known as: ZETIA Stopped by: Coralyn Helling, MD       TAKE these medications    amLODipine 2.5 MG tablet Commonly known as: NORVASC Take 1 tablet (2.5 mg total) by mouth daily.   ascorbic acid 250 MG tablet Commonly known as: VITAMIN C Take  250 mg by mouth daily.   aspirin EC 81 MG tablet Take 1 tablet (81 mg total) by mouth daily. Swallow whole.   budesonide-formoterol 160-4.5 MCG/ACT inhaler Commonly known as: Symbicort Inhale 2 puffs into the lungs 2 (two) times daily. What changed:  when to take this reasons to take this   carboxymethylcellulose 0.5 % Soln Commonly known as: REFRESH PLUS 1 drop 2 (two) times daily as needed (for dry eyes).   carvedilol 12.5 MG tablet Commonly known as: COREG Take 1 tablet (12.5 mg total) by mouth 2 (two) times daily with a meal.   CINNAMON PO Take 2.5 mLs by mouth daily.   clopidogrel 75 MG tablet Commonly known as: PLAVIX Take 1 tablet (75 mg total) by mouth daily.   Coenzyme Q10 200  MG capsule Take 200 mg by mouth daily.   COLLAGEN PO Take by mouth. Collagen Peptides. 1 scoop   cyanocobalamin 1000 MCG/ML injection Commonly known as: VITAMIN B12 Inject 1,000 mcg into the muscle once. Once a month   cyanocobalamin 1000 MCG tablet Commonly known as: VITAMIN B12 Take 1,000 mcg by mouth every Monday, Wednesday, and Friday.   DULoxetine 20 MG capsule Commonly known as: CYMBALTA Take 1 capsule by mouth once daily   Fish Oil 500 MG Caps Take 500 mg by mouth daily.   NASAL CLEANSE RINSE MIX NA Place into the nose. Xlear rinse   nitroGLYCERIN 0.4 MG SL tablet Commonly known as: NITROSTAT Place 1 tablet (0.4 mg total) under the tongue every 5 (five) minutes x 3 doses as needed for chest pain.   NON FORMULARY Take 3 fluid ounces by mouth daily. Goat Kiefer   nystatin 100000 UNIT/ML suspension Commonly known as: MYCOSTATIN Take 5 mLs (500,000 Units total) by mouth 4 (four) times daily. Started by: Coralyn Helling, MD   PROBIOTIC ADVANCED PO Take by mouth. Patient states taking 5 billion, one capsule daily. Suprema Dophilus probiotic   rosuvastatin 40 MG tablet Commonly known as: CRESTOR Take 1 tablet (40 mg total) by mouth daily.   SUPER MULTI-VITAMIN PO Take  1 capsule by mouth daily. Solgar Formula VM-75   TART CHERRY ADVANCED PO Take 2 tablets by mouth daily.   MISC NATURAL PRODUCTS PO Take by mouth. Black currant seed oil   Vitamin D3 125 MCG (5000 UT) Caps Take 5,000 Units by mouth. Every other day   Vitamin K2 100 MCG Tabs Take 180 mcg by mouth. Every 6th day. MK-7   XLEAR SINUS CARE SPRAY NA Place into the nose. 2 sprays at bedtime        Signature:  Coralyn Helling, MD [facility] Pager - [phone] 07/24/2022, 4:43 PM

## 2022-07-06 NOTE — Patient Instructions (Signed)
Nystatin 5 ml swish and swallow 4 times daily for 5 days  Use symbicort as needed for cough, wheeze, or chest congestion  Follow up in 4 months

## 2022-07-10 NOTE — Telephone Encounter (Signed)
Patient was seen by Dr. Craige Cotta on 07/06/22 and had all questions and concerns addressed then. Will close encounter.

## 2022-07-13 ENCOUNTER — Ambulatory Visit: Payer: Medicare Other | Admitting: Internal Medicine

## 2022-07-14 ENCOUNTER — Encounter (HOSPITAL_BASED_OUTPATIENT_CLINIC_OR_DEPARTMENT_OTHER): Payer: Self-pay | Admitting: Pulmonary Disease

## 2022-07-16 ENCOUNTER — Other Ambulatory Visit: Payer: Self-pay | Admitting: Family Medicine

## 2022-07-17 ENCOUNTER — Other Ambulatory Visit (HOSPITAL_BASED_OUTPATIENT_CLINIC_OR_DEPARTMENT_OTHER): Payer: Self-pay | Admitting: Pulmonary Disease

## 2022-07-18 ENCOUNTER — Ambulatory Visit: Payer: Medicare Other | Admitting: Internal Medicine

## 2022-07-18 DIAGNOSIS — M25561 Pain in right knee: Secondary | ICD-10-CM | POA: Diagnosis not present

## 2022-07-18 DIAGNOSIS — M545 Low back pain, unspecified: Secondary | ICD-10-CM | POA: Diagnosis not present

## 2022-07-18 NOTE — Progress Notes (Unsigned)
Cardiology Office Note:    Date:  04/10/2022   ID:  Kerry [name], DOB [birth year], MRN [id number]  PCP:  Street, Kerry Coup, MD  Cardiologist:  Dietrich Pates, MD    Pt presents for follow up of CAD   History of Present Illness:    Kerry [name] is a 72 y.o. female with hx of dyspnea, HTN, CAD   Calcium score CT done Dec 2023   Ca score was 1051   Started on statin/ASA.   Set up for PET CT (01/2022)which had multiple perfusion defects   LHC done in Jan 2024 showed multivessel CAD    She underwent PCI/DES to OM1 and PTCA to LC/OM2.  Platinum stent used due to concern of stent allergy She was placed on ASA and Plavix for 6 months  The pt was seen by Kerry [name] in later Jan  Still had some SOB     procedure  Unable to go through cardiac rehab due to arthritis.   Machines not the right size   Does hae leg pain with walking         I saw the pt in March 2024  Since seen she says she still gets SOB wit hactivity   She has been tired   Never felt peppy after procedure in Jan 2024.     Not very active   Trying to get to pool Denies CP   Past Medical History:  Diagnosis Date   Abnormal uterine bleeding    Allergic rhinitis, cause unspecified    Arthritis of knee, right    Bursitis of left shoulder    Cherry hemangioma 07/28/15   vulva   Chronic pain    Dyspareunia    Elevated cholesterol    Elevated uric acid in blood 2021   Fibroid    Foot fracture, left 01/2015   and torn tendons   GERD (gastroesophageal reflux disease)    HTN (hypertension)    Kidney stones    Sleep apnea    Urinary incontinence     Past Surgical History:  Procedure Laterality Date    dilatation and curettage  2000   CARPAL TUNNEL RELEASE  1998   CARPAL TUNNEL RELEASE Left 05/21/2020   CATARACT EXTRACTION Bilateral    CORONARY STENT INTERVENTION N/A 01/26/2022   Procedure: CORONARY STENT INTERVENTION;  Surgeon: Kerry Hazel, MD;  Location: [facility];  Service:  Cardiovascular;  Laterality: N/A;   dental implants  2010   DENTAL SURGERY     DILATATION & CURETTAGE/HYSTEROSCOPY WITH MYOSURE N/A 08/22/2021   Procedure: DILATATION & CURETTAGE/HYSTEROSCOPY WITH MYOSURE RESECTION OF ENDOMETRIAL MASS;  Surgeon: Kerry Salles, MD;  Location: [facility];  Service: Gynecology;  Laterality: N/A;   ESOPHAGEAL DILATION  2010   HAMMER TOE SURGERY     rt   LEFT HEART CATH AND CORONARY ANGIOGRAPHY N/A 01/25/2022   Procedure: LEFT HEART CATH AND CORONARY ANGIOGRAPHY;  Surgeon: Kerry Bihari, MD;  Location: [facility];  Service: Cardiovascular;  Laterality: N/A;   OPERATIVE ULTRASOUND N/A 08/22/2021   Procedure: OPERATIVE ULTRASOUND GUIDED HYSTEROSCOPY;  Surgeon: Kerry Salles, MD;  Location: [facility];  Service: Gynecology;  Laterality: N/A;   PELVIC LAPAROSCOPY  1990   ROTATOR CUFF REPAIR Right 09/2011   Dr. Ave Filter   ROTATOR CUFF REPAIR Left 11/11/2013   Dr. Ave Filter    SEPTOPLASTY     UMBILICAL HERNIA REPAIR  2010  Patient reports mesh was used for the repair.    Current Medications: Current Meds  Medication Sig   ascorbic acid (VITAMIN C) 250 MG tablet Take 250 mg by mouth daily.   aspirin EC 81 MG tablet Take 1 tablet (81 mg total) by mouth daily. Swallow whole.   budesonide-formoterol (SYMBICORT) 160-4.5 MCG/ACT inhaler Inhale 2 puffs into the lungs 2 (two) times daily.   carboxymethylcellulose (REFRESH PLUS) 0.5 % SOLN 1 drop 2 (two) times daily as needed (for dry eyes).   carvedilol (COREG) 12.5 MG tablet Take 1 tablet (12.5 mg total) by mouth 2 (two) times daily with a meal.   Cholecalciferol (VITAMIN D3) 5000 units CAPS Take 5,000 Units by mouth. Every other day   CINNAMON PO Take 2.5 mLs by mouth daily.   clopidogrel (PLAVIX) 75 MG tablet Take 1 tablet (75 mg total) by mouth daily.   Coenzyme Q10 200 MG capsule Take 200 mg by mouth daily.   COLLAGEN PO Take by mouth. Collagen  Peptides. 1 scoop   cyanocobalamin (VITAMIN B12) 1000 MCG tablet Take 1,000 mcg by mouth every Monday, Wednesday, and Friday.   cyanocobalamin (VITAMIN B12) 1000 MCG/ML injection Inject 1,000 mcg into the muscle once. Once a month   Menatetrenone (VITAMIN K2) 100 MCG TABS Take 180 mcg by mouth. Every 6th day. MK-7   Misc Natural Product Nasal (NASAL CLEANSE RINSE MIX NA) Place into the nose. Xlear rinse   Misc Natural Products (TART CHERRY ADVANCED PO) Take 2 tablets by mouth daily.   MISC NATURAL PRODUCTS PO Take by mouth. Black currant seed oil   Multiple Vitamins-Minerals (SUPER MULTI-VITAMIN PO) Take 1 capsule by mouth daily. Solgar Formula VM-75   nitroGLYCERIN (NITROSTAT) 0.4 MG SL tablet Place 1 tablet (0.4 mg total) under the tongue every 5 (five) minutes x 3 doses as needed for chest pain.   NON FORMULARY Take 3 fluid ounces by mouth daily. Goat Kiefer   Omega-3 Fatty Acids (FISH OIL) 500 MG CAPS Take 500 mg by mouth daily.   Probiotic Product (PROBIOTIC ADVANCED PO) Take by mouth. Patient states taking 5 billion, one capsule daily. Suprema Dophilus probiotic   rosuvastatin (CRESTOR) 20 MG tablet Take 1 tablet (20 mg total) by mouth daily.   Sodium Chloride-Xylitol (XLEAR SINUS CARE SPRAY NA) Place into the nose. 2 sprays at bedtime     Allergies:   Meloxicam, Molds & smuts, Cefixime, Celebrex [celecoxib], Cobalt, Gabapentin, Levofloxacin, Molybdenum, Other, Penicillins, Robaxin [methocarbamol], Tomato, Adhesive [tape], Dust mite extract, Tetracycline hcl, and Tetracyclines & related   Social History   Socioeconomic History   Marital status: Married    Spouse name: Not on file   Number of children: Not on file   Years of education: Not on file   Highest education level: Not on file  Occupational History   Occupation: Retired  Tobacco Use   Smoking status: Never   Smokeless tobacco: Never  Vaping Use   Vaping Use: Never used  Substance and Sexual Activity   Alcohol use: No     Alcohol/week: 0.0 standard drinks of alcohol   Drug use: No   Sexual activity: Not Currently    Partners: Male    Birth control/protection: Post-menopausal  Other Topics Concern   Not on file  Social History Narrative   Are you right handed or left handed? Right handed   Are you currently employed ? No retired   What is your current occupation?   Do you live at home alone?  No   Who lives with you? Lives with husband.    What type of home do you live in: 1 story or 2 story? Two story home.       Social Determinants of Health   Financial Resource Strain: Not on file  Food Insecurity: No Food Insecurity (01/25/2022)   Hunger Vital Sign    Worried About Running Out of Food in the Last Year: Never true    Ran Out of Food in the Last Year: Never true  Transportation Needs: No Transportation Needs (01/25/2022)   PRAPARE - Administrator, Civil Service (Medical): No    Lack of Transportation (Non-Medical): No  Physical Activity: Not on file  Stress: Not on file  Social Connections: Not on file     Family History: The patient's family history includes Arthritis in her sister; Crohn's disease in her brother; Diabetes in her mother; Glaucoma in her brother; Heart attack in her father; Kidney failure in her brother; Ovarian cancer (age of onset: 65) in her sister; Rheumatologic disease in her mother and sister; Ulcerative colitis in her brother. ROS:   Please see the history of present illness.    All 14 point review of systems negative except as described per history of present illness  EKGs/Labs/Other Studies Reviewed:    EKG   Not done today   Recent Labs: 01/24/2022: B Natriuretic Peptide 137.3 01/27/2022: BUN 13; Creatinine, Ser 0.69; Hemoglobin 12.6; Platelets 203; Potassium 3.5; Sodium 137 03/21/2022: ALT 19  Recent Lipid Panel    Component Value Date/Time   CHOL 257 (H) 02/09/2011 1027   TRIG 136 02/09/2011 1027   HDL 46 02/09/2011 1027   CHOLHDL 5.6 02/09/2011  1027   VLDL 27 02/09/2011 1027   LDLCALC 184 (H) 02/09/2011 1027    Physical Exam:    VS:  BP 136/74  P 71  W 230   Sat 97%    Wt Readings from Last 3 Encounters:  04/10/22 230 lb 9.6 oz (104.6 kg)  03/23/22 232 lb (105.2 kg)  03/21/22 232 lb 9.4 oz (105.5 kg)     GEN: Morbidly obese 72 yo in no acute distress HEENT: Normal NECK: No JVD; No carotid bruit CARDIAC: RRR, no murmurs RESPIRATORY:  Clear to auscultation bilaterally  ABDOMEN:  OBese  MUSCULOSKELETAL:  No LE  edema; No deformity   Stent intervention 01/26/22    Mid Cx lesion is 80% stenosed.   Prox Cx lesion is 20% stenosed.   2nd Diag lesion is 60% stenosed.   3rd Mrg lesion is 90% stenosed.   A drug-eluting stent was successfully placed using a SYNERGY XD 2.25X20.   Balloon angioplasty was performed using a BALLN SAPPHIRE 2.0X12.   Post intervention, there is a 0% residual stenosis.   Post intervention, there is a 60% residual stenosis.   Severe stenosis in the mid Circumflex affecting flow into the first obtuse marginal branch and the second obtuse marginal branch.  Successful PTCA/DES x 1 OM1 (Synergy stent platform as felt to be the most favorable given her metal allergy profile) Successful balloon angioplasty mid Circumflex/OM2   Recommendations: DAPT with ASA and Plavix for at least six months.    L heart cath  Jan 2024      2nd Diag lesion is 60% stenosed.   Mid Cx lesion is 80% stenosed.   3rd Mrg lesion is 90% stenosed.   Prox Cx lesion is 20% stenosed.   Prox RCA lesion is 40% stenosed.  Mid RCA lesion is 10% stenosed.   Dist RCA lesion is 20% stenosed.   RPDA-1 lesion is 30% stenosed.   RPDA-2 lesion is 70% stenosed.   LV end diastolic pressure is severely elevated.   Multivessel CAD with 60% ostial narrowing of the second diagonal branch of the LAD; 20% proximal circumflex stenosis with mid circumflex bifurcation stenosis of 80 and 90%; dominant RCA with 40% proximal stenosis, mild 10 and  20% mid stenoses, and 30 and 70% mid and distal PDA stenosis.   Serve global LV contractility with EF estimated 55 to 60%.  Elevated LVEDP at 30 mm.   Significant blood pressure elevation for which the patient received hydralazine 10 mg x 2 post procedure.   RECOMMENDATION: Patient most likely will need PCI for her bifurcation circumflex stenosis.  She was very concerned about potential metal allergies.  We reviewed the stents with her and her concern with the molybdenum which is present in both the Synergy and Medtronic stents.  The Synergy stent also has platinum chromium in the Resolute stent platinum iridium in addition to cobalt alloy inner core.  Will discuss with his representatives.  Plan intervention tomorrow once further discussion obtained with patient family and device reps.    Impression     1  CAD  Pt s/p PTCA/Stent and PCI in Jan 2024   Still with dyspnea   I do not think it represents angina    Body habitus and lack of conditioning are more likely causes   2  HTN  BP is a little high on current regimen  She says it is 130s to 150  I would recomm increasing amlodipine to 5 mg daily    3   HL   She has not added Zetia to regimen  I would for tighter cntrol of LDL   Follow up lipids and liver panel in October   4  OSA   Continue CPAP  Encouraged her to get active in pool  Medication Adjustments/Labs and Tests Ordered: Current medicines are reviewed at length with the patient today.  Concerns regarding medicines are outlined above.  No orders of the defined types were placed in this encounter.  Medication changes: No orders of the defined types were placed in this encounter.   Signed, Georgeanna Lea, MD, [facility] 04/10/2022 4:32 PM    [facility]

## 2022-07-19 ENCOUNTER — Ambulatory Visit: Payer: Medicare Other | Attending: Internal Medicine | Admitting: Internal Medicine

## 2022-07-19 ENCOUNTER — Other Ambulatory Visit (HOSPITAL_COMMUNITY): Payer: Self-pay

## 2022-07-19 VITALS — BP 136/74 | HR 71 | Ht <= 58 in | Wt 230.0 lb

## 2022-07-19 DIAGNOSIS — I10 Essential (primary) hypertension: Secondary | ICD-10-CM | POA: Diagnosis not present

## 2022-07-19 DIAGNOSIS — E785 Hyperlipidemia, unspecified: Secondary | ICD-10-CM

## 2022-07-19 MED ORDER — EZETIMIBE 10 MG PO TABS: 10.0000 mg | ORAL_TABLET | Freq: Every day | ORAL | 3 refills | Status: DC

## 2022-07-19 MED ORDER — AMLODIPINE BESYLATE 5 MG PO TABS: 5.0000 mg | ORAL_TABLET | Freq: Every day | ORAL | 3 refills | Status: DC

## 2022-07-19 NOTE — Patient Instructions (Signed)
Medication Instructions:  Increase amlodipine to 5 mg daily.  *If you need a refill on your cardiac medications before your next appointment, please call your pharmacy*   Lab Work: Lipids in October.   If you have labs (blood work) drawn today and your tests are completely normal, you will receive your results only by: MyChart Message (if you have MyChart) OR A paper copy in the mail If you have any lab test that is abnormal or we need to change your treatment, we will call you to review the results.  Follow-Up: At [facility], you and your health needs are our priority.  As part of our continuing mission to provide you with exceptional heart care, we have created designated Provider Care Teams.  These Care Teams include your primary Cardiologist (physician) and Advanced Practice Providers (APPs -  Physician Assistants and Nurse Practitioners) who all work together to provide you with the care you need, when you need it.  We recommend signing up for the patient portal called "MyChart".  Sign up information is provided on this After Visit Summary.  MyChart is used to connect with patients for Virtual Visits (Telemedicine).  Patients are able to view lab/test results, encounter notes, upcoming appointments, etc.  Non-urgent messages can be sent to your provider as well.   To learn more about what you can do with MyChart, go to [email or web address].    Your next appointment:   December 2024  Provider:   Dietrich Pates, MD

## 2022-07-21 ENCOUNTER — Other Ambulatory Visit (HOSPITAL_COMMUNITY): Payer: Self-pay

## 2022-07-25 DIAGNOSIS — M545 Low back pain, unspecified: Secondary | ICD-10-CM | POA: Diagnosis not present

## 2022-07-25 DIAGNOSIS — M25561 Pain in right knee: Secondary | ICD-10-CM | POA: Diagnosis not present

## 2022-08-01 DIAGNOSIS — M25561 Pain in right knee: Secondary | ICD-10-CM | POA: Diagnosis not present

## 2022-08-01 DIAGNOSIS — M545 Low back pain, unspecified: Secondary | ICD-10-CM | POA: Diagnosis not present

## 2022-08-02 NOTE — Progress Notes (Unsigned)
Kerry [name] [address] [place] [address] Phone: [phone] Subjective:    I'm seeing this patient by the request  of:  Street, [name], MD  CC: Low back pain and allover pain follow-up  [id number]  Kerry [name] is a 72 y.o. female coming in with complaint of back and neck pain. OMT on 06/22/2022. Patient states has PT yesterday in the pool and is having more aches in pain than usual because she was in the shallow end. Patient is going to have some issues with getting into the the PT sessions with their schedules and wanted to know if there is some exercises that she should do at home. Patient states that no matter what her and her cardiologist do to bring down her BP she still is having issues with it staying high. She wanted to know if there was anything that Dr. Katrinka Blazing sees that could be causing this or not.   Patient states that she started to get a rash when she started the Cymbalta and wants to know if there is a possibility that is connected.   Medications patient has been prescribed: Cymbalta  Taking: Cymbalta          Reviewed prior external information including notes and imaging from previsou exam, outside providers and external EMR if available.  Patient did recently was treated with the liquid nystatin.  States that initially felt better but then had significant fatigue.  As well as notes that were available from care everywhere and other healthcare systems.  Past medical history, social, surgical and family history all reviewed in electronic medical record.  No pertanent information unless stated regarding to the chief complaint.   Past Medical History:  Diagnosis Date   Abnormal uterine bleeding    Allergic rhinitis, cause unspecified    Arthritis of knee, right    Bursitis of left shoulder    Cherry hemangioma 07/28/2015   vulva   Chronic pain    Coronary artery disease    Dyspareunia    Elevated  cholesterol    Elevated uric acid in blood 2021   Fibroid    Foot fracture, left 01/2015   and torn tendons   GERD (gastroesophageal reflux disease)    HTN (hypertension)    Interstitial lung disease (HCC)    Kidney stones    Sleep apnea    Urinary incontinence     Allergies  Allergen Reactions   Meloxicam Other (See Comments)    Made patient have stomach pain ,sore throat, joint pains, face flush    Molds & Smuts Other (See Comments), Palpitations, Shortness Of Breath and Swelling   Cefixime Swelling   Celebrex [Celecoxib] Swelling   Cobalt Other (See Comments)    Per allergist   Gabapentin Other (See Comments)    Joint pain   Levofloxacin Other (See Comments)    Muscle and joint pain   Molybdenum Other (See Comments)    Per allergist   Other Other (See Comments)    TEQUIN. TEQUIN - JOINT AND MUSCLE PAIN Other Reaction: painful joints TEQUIN. TEQUIN - JOINT AND MUSCLE PAIN Tantal Metal   Penicillins Hives   Robaxin [Methocarbamol]    Tomato Hives   Adhesive [Tape] Rash    Sensitive to EKG leads; sensitive to 29M Micropore tape   Dust Mite Extract Itching, Other (See Comments) and Palpitations   Tetracycline Hcl Rash    Can take Doxy   Tetracyclines & Related Rash  Review of Systems:  No headache, visual changes, nausea, vomiting, diarrhea, constipation, dizziness, abdominal pain, skin rash, fevers, chills, night sweats, weight loss, swollen lymph nodes, body aches, joint swelling, chest pain, shortness of breath, mood changes. POSITIVE muscle aches  Objective  Blood pressure (!) 150/80, pulse 80, height 4\' 10"  (1.473 m), weight 237 lb (107.5 kg), last menstrual period 08/16/1992, SpO2 95%.   General: No apparent distress alert and oriented x3 mood and affect normal, dressed appropriately.  HEENT: Pupils equal, extraocular movements intact  Respiratory: Patient's speak in full sentences and does not appear short of breath  Cardiovascular: No lower extremity  edema, non tender, no erythema  Still very antalgic gait noted.  Patient does have tenderness to palpation in multiple different areas.  No significant arthritic changes noted of the knees bilaterally.  Osteopathic findings  C3 flexed rotated and side bent right C5 flexed rotated and side bent left T3 extended rotated and side bent right inhaled rib T7 extended rotated and side bent left L3 flexed rotated and side bent left L5 flexed rotated and side bent left Sacrum right on right       Assessment and Plan:  Degenerative cervical disc Known degenerative disc disease.  Has had some difficulty again recently.  Still though now making some improvement with the Cymbalta.  Will going to continue to stay at the low dose but I do think there is a strong possibility we will need to increase.  Hopefully patient will continue to improve otherwise.  Keep working on posture, core strength, weight loss, follow-up again in 6 weeks    Nonallopathic problems  Decision today to treat with OMT was based on Physical Exam  After verbal consent patient was treated with HVLA, ME, FPR techniques in cervical, rib, thoracic, lumbar, and sacral  areas  Patient tolerated the procedure well with improvement in symptoms  Patient given exercises, stretches and lifestyle modifications  See medications in patient instructions if given  Patient will follow up in 4-8 weeks    The above documentation has been reviewed and is accurate and complete Judi Saa, DO          Note: This dictation was prepared with Dragon dictation along with smaller phrase technology. Any transcriptional errors that result from this process are unintentional.

## 2022-08-03 DIAGNOSIS — E785 Hyperlipidemia, unspecified: Secondary | ICD-10-CM | POA: Diagnosis not present

## 2022-08-03 DIAGNOSIS — E559 Vitamin D deficiency, unspecified: Secondary | ICD-10-CM | POA: Diagnosis not present

## 2022-08-08 DIAGNOSIS — M545 Low back pain, unspecified: Secondary | ICD-10-CM | POA: Diagnosis not present

## 2022-08-08 DIAGNOSIS — M25561 Pain in right knee: Secondary | ICD-10-CM | POA: Diagnosis not present

## 2022-08-09 ENCOUNTER — Ambulatory Visit: Payer: Medicare Other | Admitting: Family Medicine

## 2022-08-09 VITALS — BP 150/80 | HR 80 | Ht <= 58 in | Wt 237.0 lb

## 2022-08-09 DIAGNOSIS — M9903 Segmental and somatic dysfunction of lumbar region: Secondary | ICD-10-CM

## 2022-08-09 DIAGNOSIS — M9901 Segmental and somatic dysfunction of cervical region: Secondary | ICD-10-CM

## 2022-08-09 DIAGNOSIS — M5030 Other cervical disc degeneration, unspecified cervical region: Secondary | ICD-10-CM | POA: Diagnosis not present

## 2022-08-09 DIAGNOSIS — M9904 Segmental and somatic dysfunction of sacral region: Secondary | ICD-10-CM | POA: Diagnosis not present

## 2022-08-09 DIAGNOSIS — M9902 Segmental and somatic dysfunction of thoracic region: Secondary | ICD-10-CM | POA: Diagnosis not present

## 2022-08-09 DIAGNOSIS — M9908 Segmental and somatic dysfunction of rib cage: Secondary | ICD-10-CM | POA: Diagnosis not present

## 2022-08-09 NOTE — Patient Instructions (Signed)
Good to see you Keep up with the Cymbalta May make changes in the future See me again 6-8 weeks

## 2022-08-10 NOTE — Assessment & Plan Note (Addendum)
Known degenerative disc disease.  Has had some difficulty again recently.  Still though now making some improvement with the Cymbalta.  Will going to continue to stay at the low dose but I do think there is a strong possibility we will need to increase.  Hopefully patient will continue to improve otherwise.  Keep working on posture, core strength, weight loss, follow-up again in 6 weeks total time with patient today 31 minutes

## 2022-08-15 ENCOUNTER — Encounter: Payer: Self-pay | Admitting: Family Medicine

## 2022-08-15 ENCOUNTER — Other Ambulatory Visit: Payer: Self-pay

## 2022-08-15 MED ORDER — DULOXETINE HCL 30 MG PO CPEP: 30.0000 mg | ORAL_CAPSULE | Freq: Every day | ORAL | 1 refills | Status: DC

## 2022-08-16 NOTE — Telephone Encounter (Signed)
Patient said that she woke up today with a fever and congestion. She wanted to make sure that increasing her Cymbalta would not be an issue with her current cold symptoms.

## 2022-08-18 ENCOUNTER — Ambulatory Visit: Payer: Medicare Other | Admitting: Obstetrics and Gynecology

## 2022-08-21 ENCOUNTER — Encounter: Payer: Self-pay | Admitting: Family Medicine

## 2022-08-24 ENCOUNTER — Encounter: Payer: Self-pay | Admitting: Internal Medicine

## 2022-08-25 ENCOUNTER — Ambulatory Visit: Payer: Medicare Other | Admitting: Obstetrics and Gynecology

## 2022-08-29 DIAGNOSIS — E538 Deficiency of other specified B group vitamins: Secondary | ICD-10-CM | POA: Diagnosis not present

## 2022-08-29 DIAGNOSIS — E790 Hyperuricemia without signs of inflammatory arthritis and tophaceous disease: Secondary | ICD-10-CM | POA: Diagnosis not present

## 2022-08-29 DIAGNOSIS — E785 Hyperlipidemia, unspecified: Secondary | ICD-10-CM | POA: Diagnosis not present

## 2022-08-29 DIAGNOSIS — R7309 Other abnormal glucose: Secondary | ICD-10-CM | POA: Diagnosis not present

## 2022-08-29 LAB — LAB REPORT - SCANNED
A1c: 6
EGFR: 60

## 2022-08-31 DIAGNOSIS — U099 Post covid-19 condition, unspecified: Secondary | ICD-10-CM | POA: Diagnosis not present

## 2022-08-31 DIAGNOSIS — E785 Hyperlipidemia, unspecified: Secondary | ICD-10-CM | POA: Diagnosis not present

## 2022-08-31 DIAGNOSIS — R053 Chronic cough: Secondary | ICD-10-CM | POA: Diagnosis not present

## 2022-08-31 DIAGNOSIS — G720 Drug-induced myopathy: Secondary | ICD-10-CM | POA: Diagnosis not present

## 2022-09-05 NOTE — Progress Notes (Unsigned)
[name] [address] [place] [address] Phone: [phone] Subjective:   Kerry [name], am serving as a scribe for Dr. Antoine Primas.  I'm seeing this patient by the request  of:  Street, Kerry Coup, MD  CC: Multiple joint complaint follow-up  [id number]  Kerry [name] is a 72 y.o. female coming in with complaint of back and neck pain. OMT on 08/09/2022. Patient states that she had COVID at the end of July. Was going to take higher dose of Cymbalta but did not start it because she had COVID. Stopped Cymbalta and statin and said that she feels better not taking it both per PCP.   Would like lower dose of Cymbalta if recommended.   Medications patient has been prescribed: Cymbalta  Taking:         Reviewed prior external information including notes and imaging from previsou exam, outside providers and external EMR if available.   As well as notes that were available from care everywhere and other healthcare systems.  Past medical history, social, surgical and family history all reviewed in electronic medical record.  No pertanent information unless stated regarding to the chief complaint.   Past Medical History:  Diagnosis Date   Abnormal uterine bleeding    Allergic rhinitis, cause unspecified    Arthritis of knee, right    Bursitis of left shoulder    Cherry hemangioma 07/28/2015   vulva   Chronic pain    Coronary artery disease    Dyspareunia    Elevated cholesterol    Elevated uric acid in blood 2021   Fibroid    Foot fracture, left 01/2015   and torn tendons   GERD (gastroesophageal reflux disease)    HTN (hypertension)    Interstitial lung disease (HCC)    Kidney stones    Sleep apnea    Urinary incontinence     Allergies  Allergen Reactions   Meloxicam Other (See Comments)    Made patient have stomach pain ,sore throat, joint pains, face flush    Molds & Smuts Other (See Comments),  Palpitations, Shortness Of Breath and Swelling   Cefixime Swelling   Celebrex [Celecoxib] Swelling   Cobalt Other (See Comments)    Per allergist   Gabapentin Other (See Comments)    Joint pain   Levofloxacin Other (See Comments)    Muscle and joint pain   Molybdenum Other (See Comments)    Per allergist   Other Other (See Comments)    TEQUIN. TEQUIN - JOINT AND MUSCLE PAIN Other Reaction: painful joints TEQUIN. TEQUIN - JOINT AND MUSCLE PAIN Tantal Metal   Penicillins Hives   Robaxin [Methocarbamol]    Tomato Hives   Adhesive [Tape] Rash    Sensitive to EKG leads; sensitive to 49M Micropore tape   Dust Mite Extract Itching, Other (See Comments) and Palpitations   Tetracycline Hcl Rash    Can take Doxy   Tetracyclines & Related Rash     Review of Systems:  No headache, visual changes, nausea, vomiting, diarrhea, constipation, dizziness, abdominal pain, skin rash, fevers, chills, night sweats, weight loss, swollen lymph nodes, body aches, joint swelling, chest pain, shortness of breath, mood changes. POSITIVE muscle aches  Objective  Blood pressure 130/84, pulse 75, height 4\' 10"  (1.473 m), last menstrual period 08/16/1992, SpO2 97%.   General: No apparent distress alert and oriented x3 mood and affect normal, dressed appropriately.  HEENT: Pupils equal, extraocular movements intact  Respiratory: Patient's  speak in full sentences and does not appear short of breath  Cardiovascular: No lower extremity edema, non tender, no erythema  Low back exam does have some loss lordosis noted.  Difficulty with antalgic gait noted.  Patient does have limited range of motion in all planes of multiple different joints.  Patient back exam does have significant tightness noted.  Did not do neurovascular testing today.  Osteopathic findings  C2 flexed rotated and side bent right C6 flexed rotated and side bent left T3 extended rotated and side bent right inhaled rib T9 extended rotated and  side bent left L2 flexed rotated and side bent right Sacrum right on right       Assessment and Plan:  Arthralgia of multiple joints Continues to have significant difficulty.  We discussed with patient again at great length.  Patient continues to get sick different medication and side effects.  Was doing better with the Cymbalta but now states otherwise.  At this point I do not think we have any other treatment options for her that are available.  We can continue to do the manipulation if she finds it helpful but do not think further workup is going to be necessary specifically for more of a systemic findings.  Patient does have arthritic changes in multiple joints and we will continue to monitor.  He may need other injections from time to time and patient wants to avoid any surgical intervention otherwise.  Follow-up with me again in 6 to 8 weeks  Low back pain Chronic low back pain.  Patient continues to have some difficulty.  Once again discussed different medications such as the Cymbalta that seem to be helping initially.  Refilled to 20 mg.  Patient will decide if she will start it we will wait until her husband comes back into the country.  Follow-up again in 6 to 8 weeks    Nonallopathic problems  Decision today to treat with OMT was based on Physical Exam  After verbal consent patient was treated with  ME, FPR techniques in cervical, rib, thoracic, lumbar, and sacral  areas mostly with manual techniques.  Patient tolerated the procedure well with improvement in symptoms  Patient given exercises, stretches and lifestyle modifications  See medications in patient instructions if given  Patient will follow up in 4-8 weeks    The above documentation has been reviewed and is accurate and complete Judi Saa, DO          Note: This dictation was prepared with Dragon dictation along with smaller phrase technology. Any transcriptional errors that result from this process  are unintentional.

## 2022-09-06 ENCOUNTER — Ambulatory Visit (INDEPENDENT_AMBULATORY_CARE_PROVIDER_SITE_OTHER): Payer: Medicare Other | Admitting: Family Medicine

## 2022-09-06 VITALS — BP 130/84 | HR 75 | Ht <= 58 in

## 2022-09-06 DIAGNOSIS — G8929 Other chronic pain: Secondary | ICD-10-CM

## 2022-09-06 DIAGNOSIS — M9903 Segmental and somatic dysfunction of lumbar region: Secondary | ICD-10-CM

## 2022-09-06 DIAGNOSIS — M9902 Segmental and somatic dysfunction of thoracic region: Secondary | ICD-10-CM

## 2022-09-06 DIAGNOSIS — M9904 Segmental and somatic dysfunction of sacral region: Secondary | ICD-10-CM | POA: Diagnosis not present

## 2022-09-06 DIAGNOSIS — M9901 Segmental and somatic dysfunction of cervical region: Secondary | ICD-10-CM

## 2022-09-06 DIAGNOSIS — M9908 Segmental and somatic dysfunction of rib cage: Secondary | ICD-10-CM | POA: Diagnosis not present

## 2022-09-06 DIAGNOSIS — M545 Low back pain, unspecified: Secondary | ICD-10-CM | POA: Diagnosis not present

## 2022-09-06 DIAGNOSIS — M2550 Pain in unspecified joint: Secondary | ICD-10-CM | POA: Diagnosis not present

## 2022-09-06 MED ORDER — DULOXETINE HCL 20 MG PO CPEP: 20.0000 mg | ORAL_CAPSULE | Freq: Every day | ORAL | 0 refills | Status: DC

## 2022-09-06 NOTE — Assessment & Plan Note (Signed)
Chronic low back pain.  Patient continues to have some difficulty.  Once again discussed different medications such as the Cymbalta that seem to be helping initially.  Refilled to 20 mg.  Patient will decide if she will start it we will wait until her husband comes back into the country.  Follow-up again in 6 to 8 weeks

## 2022-09-06 NOTE — Assessment & Plan Note (Signed)
Continues to have significant difficulty.  We discussed with patient again at great length.  Patient continues to get sick different medication and side effects.  Was doing better with the Cymbalta but now states otherwise.  At this point I do not think we have any other treatment options for her that are available.  We can continue to do the manipulation if she finds it helpful but do not think further workup is going to be necessary specifically for more of a systemic findings.  Patient does have arthritic changes in multiple joints and we will continue to monitor.  He may need other injections from time to time and patient wants to avoid any surgical intervention otherwise.  Follow-up with me again in 6 to 8 weeks

## 2022-09-06 NOTE — Patient Instructions (Addendum)
Cymbalta 20mg   See me in 6 weeks

## 2022-09-07 ENCOUNTER — Encounter: Payer: Self-pay | Admitting: Family Medicine

## 2022-09-11 ENCOUNTER — Telehealth: Payer: Self-pay | Admitting: Pulmonary Disease

## 2022-09-11 ENCOUNTER — Telehealth: Payer: Self-pay

## 2022-09-11 DIAGNOSIS — Z8041 Family history of malignant neoplasm of ovary: Secondary | ICD-10-CM

## 2022-09-11 DIAGNOSIS — R9389 Abnormal findings on diagnostic imaging of other specified body structures: Secondary | ICD-10-CM

## 2022-09-11 NOTE — Telephone Encounter (Signed)
Ok to schedule pelvic exam at [facility].  Dx: family history of ovarian cancer.

## 2022-09-11 NOTE — Telephone Encounter (Signed)
Coughing and phlegm and SOB and O2 saturation has been bet 90-98.  Had Covid on July 30th and still has these symptoms. Seeks to know if there is anything we can call in to offer relief. Due for FU in late Oct. No Sood appts.  Also has some questions about the meds she is taking for Thrush.Pharm gave her for 3 days but Dr. Javier Docker her to take it for 5 days. Would that cause brain fog or other side effects due to Yeast toxicity?  [facility] in [place]

## 2022-09-11 NOTE — Telephone Encounter (Signed)
Pt LVM in triage line stating that her B&P exam just got r/s by office from 9/5 to 03/2023. States she is on the cancellation list if anything opens up sooner. However, is inquiring about her regular US's that she usually gets for her fx hx of ovarian cancer. Inquiring if she needs to go ahead and have that done?  Last performed on 08/22/2021.   Please advise.

## 2022-09-12 NOTE — Telephone Encounter (Signed)
CB, no answer, LDVM on machine per DPR. Advising pt that order has been placed for her to receive her [place] at an outpatient imaging center and the number to centralized scheduling was provided.

## 2022-09-15 NOTE — Telephone Encounter (Signed)
Patient has multiple complaints symptoms have been going on for over a month needs office visit to sort out everything. Please set up for office visit with Dr. Craige Cotta or APP .  Please contact office for sooner follow up if symptoms do not improve or worsen or seek emergency care

## 2022-09-21 ENCOUNTER — Ambulatory Visit: Payer: Medicare Other | Admitting: Obstetrics and Gynecology

## 2022-09-22 ENCOUNTER — Encounter: Payer: Self-pay | Admitting: Obstetrics and Gynecology

## 2022-09-22 ENCOUNTER — Other Ambulatory Visit: Payer: Self-pay | Admitting: Obstetrics and Gynecology

## 2022-09-22 DIAGNOSIS — Z1231 Encounter for screening mammogram for malignant neoplasm of breast: Secondary | ICD-10-CM

## 2022-10-03 DIAGNOSIS — R300 Dysuria: Secondary | ICD-10-CM | POA: Diagnosis not present

## 2022-10-03 DIAGNOSIS — I25119 Atherosclerotic heart disease of native coronary artery with unspecified angina pectoris: Secondary | ICD-10-CM | POA: Diagnosis not present

## 2022-10-03 DIAGNOSIS — J0141 Acute recurrent pansinusitis: Secondary | ICD-10-CM | POA: Diagnosis not present

## 2022-10-03 DIAGNOSIS — M2550 Pain in unspecified joint: Secondary | ICD-10-CM | POA: Diagnosis not present

## 2022-10-03 DIAGNOSIS — Z23 Encounter for immunization: Secondary | ICD-10-CM | POA: Diagnosis not present

## 2022-10-18 ENCOUNTER — Ambulatory Visit (HOSPITAL_COMMUNITY)
Admission: RE | Admit: 2022-10-18 | Discharge: 2022-10-18 | Disposition: A | Discharge status: Home / Self Care | Payer: Medicare Other | Source: Ambulatory Visit | Attending: Obstetrics and Gynecology | Admitting: Obstetrics and Gynecology

## 2022-10-18 ENCOUNTER — Encounter: Payer: Self-pay | Admitting: Internal Medicine

## 2022-10-18 ENCOUNTER — Ambulatory Visit
Admission: RE | Admit: 2022-10-18 | Discharge: 2022-10-18 | Disposition: A | Discharge status: Home / Self Care | Payer: Medicare Other | Source: Ambulatory Visit | Attending: Obstetrics and Gynecology | Admitting: Obstetrics and Gynecology

## 2022-10-18 DIAGNOSIS — D259 Leiomyoma of uterus, unspecified: Secondary | ICD-10-CM | POA: Diagnosis not present

## 2022-10-18 DIAGNOSIS — Z8041 Family history of malignant neoplasm of ovary: Secondary | ICD-10-CM | POA: Insufficient documentation

## 2022-10-18 DIAGNOSIS — Z780 Asymptomatic menopausal state: Secondary | ICD-10-CM | POA: Diagnosis not present

## 2022-10-18 DIAGNOSIS — Z1231 Encounter for screening mammogram for malignant neoplasm of breast: Secondary | ICD-10-CM | POA: Diagnosis not present

## 2022-10-18 DIAGNOSIS — R935 Abnormal findings on diagnostic imaging of other abdominal regions, including retroperitoneum: Secondary | ICD-10-CM | POA: Diagnosis not present

## 2022-10-18 DIAGNOSIS — Z1273 Encounter for screening for malignant neoplasm of ovary: Secondary | ICD-10-CM | POA: Insufficient documentation

## 2022-10-18 DIAGNOSIS — C539 Malignant neoplasm of cervix uteri, unspecified: Secondary | ICD-10-CM | POA: Diagnosis not present

## 2022-10-19 NOTE — Telephone Encounter (Signed)
US performed on 10/18/2022 for fx hx of ovarian cancer. AEX scheduled in 03/2023. Would you like for the pt to schedule an OV to go over results or will you just send result note via mychart (pt currently active)? Or we can just wait until results return to decide. Let me know, thanks.

## 2022-10-23 ENCOUNTER — Ambulatory Visit: Payer: Medicare Other

## 2022-10-23 NOTE — Telephone Encounter (Signed)
Results of pelvic ultrasound are pending.   I will need to review the results and then determine if a result note through My Chart is ok or if she needs an office visit.  Thanks.

## 2022-10-24 ENCOUNTER — Encounter: Payer: Self-pay | Admitting: Internal Medicine

## 2022-10-24 ENCOUNTER — Ambulatory Visit: Payer: Medicare Other | Admitting: Adult Health

## 2022-10-24 DIAGNOSIS — E785 Hyperlipidemia, unspecified: Secondary | ICD-10-CM

## 2022-10-24 DIAGNOSIS — R0609 Other forms of dyspnea: Secondary | ICD-10-CM

## 2022-10-24 DIAGNOSIS — I10 Essential (primary) hypertension: Secondary | ICD-10-CM

## 2022-10-24 DIAGNOSIS — Z136 Encounter for screening for cardiovascular disorders: Secondary | ICD-10-CM

## 2022-10-24 DIAGNOSIS — I2510 Atherosclerotic heart disease of native coronary artery without angina pectoris: Secondary | ICD-10-CM

## 2022-10-24 DIAGNOSIS — R7303 Prediabetes: Secondary | ICD-10-CM

## 2022-10-24 DIAGNOSIS — R0600 Dyspnea, unspecified: Secondary | ICD-10-CM

## 2022-10-24 DIAGNOSIS — R002 Palpitations: Secondary | ICD-10-CM

## 2022-10-24 NOTE — Telephone Encounter (Signed)
Called pt   Se says she is feeling better off statin  Recomm:   Cancel labs this week In one month get lipomed, Apo B, A1C CMET  Fasting

## 2022-10-24 NOTE — Progress Notes (Signed)
[name] [address] [place] [address] Phone: [phone] Subjective:   [name], am serving as a scribe for Dr. Antoine Primas.  I'm seeing this patient by the request  of:  Street, [name], MD  CC: back and neck pain follow up   [id number]  Kerry [name] is a 72 y.o. female coming in with complaint of back and neck pain. OMT 09/06/2022. Patient states that she had a few weeks of blood in her urine. No signs of infection but wonders if it is kidney stones. Has not started cymbalta.   Pulmonologist Dr. Craige Cotta is no longer with [facility].   Has not been very active so her back and neck pain have not increased.   Medications patient has been prescribed: Cymbalta  Taking:         Reviewed prior external information including notes and imaging from previsou exam, outside providers and external EMR if available.   As well as notes that were available from care everywhere and other healthcare systems.  Past medical history, social, surgical and family history all reviewed in electronic medical record.  No pertanent information unless stated regarding to the chief complaint.   Past Medical History:  Diagnosis Date   Abnormal uterine bleeding    Allergic rhinitis, cause unspecified    Arthritis of knee, right    Bursitis of left shoulder    Cherry hemangioma 07/28/2015   vulva   Chronic pain    Coronary artery disease    Dyspareunia    Elevated cholesterol    Elevated uric acid in blood 2021   Fibroid    Foot fracture, left 01/2015   and torn tendons   GERD (gastroesophageal reflux disease)    HTN (hypertension)    Interstitial lung disease (HCC)    Kidney stones    Sleep apnea    Urinary incontinence     Allergies  Allergen Reactions   Meloxicam Other (See Comments)    Made patient have stomach pain ,sore throat, joint pains, face flush    Molds & Smuts Other (See Comments), Palpitations, Shortness Of  Breath and Swelling   Cefixime Swelling   Celebrex [Celecoxib] Swelling   Cobalt Other (See Comments)    Per allergist   Gabapentin Other (See Comments)    Joint pain   Levofloxacin Other (See Comments)    Muscle and joint pain   Molybdenum Other (See Comments)    Per allergist   Other Other (See Comments)    TEQUIN. TEQUIN - JOINT AND MUSCLE PAIN Other Reaction: painful joints TEQUIN. TEQUIN - JOINT AND MUSCLE PAIN Tantal Metal   Penicillins Hives   Robaxin [Methocarbamol]    Tomato Hives   Adhesive [Tape] Rash    Sensitive to EKG leads; sensitive to 30M Micropore tape   Dust Mite Extract Itching, Other (See Comments) and Palpitations   Tetracycline Hcl Rash    Can take Doxy   Tetracyclines & Related Rash     Review of Systems:  No headache, visual changes, nausea, vomiting, diarrhea, constipation, dizziness, abdominal pain, skin rash, fevers, chills, night sweats, weight loss, swollen lymph nodes, body aches, joint swelling, chest pain, shortness of breath, mood changes. POSITIVE muscle aches  Objective  Blood pressure (!) 142/84, height 4\' 10"  (1.473 m), last menstrual period 08/16/1992.   General: No apparent distress alert and oriented x3 mood and affect normal, dressed appropriately.  HEENT: Pupils equal, extraocular movements intact  Respiratory: Patient's speak  in full sentences and does not appear short of breath  Antalgic gait noted.  Tenderness to palpation noted.  Difficulty with getting up onto the table either.  He does have some significant loss of lordosis noted. Tightness noted the paraspinal musculature surrounding the paraspinal area.  Osteopathic findings  C3 flexed rotated and side bent right C5 flexed rotated and side bent left T3 extended rotated and side bent right inhaled rib T4 extended rotated and side bent left L1 flexed rotated and side bent right L3 flexed rotated and side bent left Sacrum right on right       Assessment and  Plan:  Low back pain Chronic exacerbation noted, discussed which activities to do and which ones to avoid.  Increase activity slowly over the course of next several weeks.  Discussed which activities to do and which ones to avoid.  Patient does have other health issues and is awaiting seeing urogynecology yesterday near future.    Nonallopathic problems  Decision today to treat with OMT was based on Physical Exam  After verbal consent patient was treated with , ME, FPR techniques in cervical, rib, thoracic, lumbar, and sacral  areas  Patient tolerated the procedure well with improvement in symptoms  Patient given exercises, stretches and lifestyle modifications  See medications in patient instructions if given  Patient will follow up in 4-8 weeks     The above documentation has been reviewed and is accurate and complete Judi Saa, DO         Note: This dictation was prepared with Dragon dictation along with smaller phrase technology. Any transcriptional errors that result from this process are unintentional.

## 2022-10-24 NOTE — Addendum Note (Signed)
Addended by: Bertram Millard on: 10/24/2022 11:05 AM   Modules accepted: Orders

## 2022-10-25 ENCOUNTER — Encounter: Payer: Self-pay | Admitting: Family Medicine

## 2022-10-25 ENCOUNTER — Encounter: Payer: Self-pay | Admitting: Obstetrics and Gynecology

## 2022-10-25 ENCOUNTER — Ambulatory Visit: Payer: Medicare Other | Admitting: Family Medicine

## 2022-10-25 ENCOUNTER — Ambulatory Visit (INDEPENDENT_AMBULATORY_CARE_PROVIDER_SITE_OTHER): Payer: Medicare Other | Admitting: Obstetrics and Gynecology

## 2022-10-25 ENCOUNTER — Ambulatory Visit: Payer: Medicare Other

## 2022-10-25 VITALS — BP 142/84 | Ht <= 58 in

## 2022-10-25 VITALS — BP 149/82 | HR 96

## 2022-10-25 DIAGNOSIS — M545 Low back pain, unspecified: Secondary | ICD-10-CM

## 2022-10-25 DIAGNOSIS — M9901 Segmental and somatic dysfunction of cervical region: Secondary | ICD-10-CM

## 2022-10-25 DIAGNOSIS — M9904 Segmental and somatic dysfunction of sacral region: Secondary | ICD-10-CM

## 2022-10-25 DIAGNOSIS — R350 Frequency of micturition: Secondary | ICD-10-CM | POA: Diagnosis not present

## 2022-10-25 DIAGNOSIS — M9903 Segmental and somatic dysfunction of lumbar region: Secondary | ICD-10-CM | POA: Diagnosis not present

## 2022-10-25 DIAGNOSIS — N393 Stress incontinence (female) (male): Secondary | ICD-10-CM | POA: Diagnosis not present

## 2022-10-25 DIAGNOSIS — M9902 Segmental and somatic dysfunction of thoracic region: Secondary | ICD-10-CM

## 2022-10-25 DIAGNOSIS — G8929 Other chronic pain: Secondary | ICD-10-CM

## 2022-10-25 DIAGNOSIS — M9908 Segmental and somatic dysfunction of rib cage: Secondary | ICD-10-CM | POA: Diagnosis not present

## 2022-10-25 DIAGNOSIS — N3281 Overactive bladder: Secondary | ICD-10-CM | POA: Diagnosis not present

## 2022-10-25 LAB — POCT URINALYSIS DIPSTICK
Bilirubin, UA: NEGATIVE
Blood, UA: NEGATIVE
Glucose, UA: NEGATIVE
Ketones, UA: NEGATIVE
Leukocytes, UA: NEGATIVE
Nitrite, UA: NEGATIVE
Protein, UA: NEGATIVE
Spec Grav, UA: 1.025 (ref 1.010–1.025)
Urobilinogen, UA: 0.2 U/dL
pH, UA: 6 (ref 5.0–8.0)

## 2022-10-25 NOTE — Progress Notes (Signed)
Urogynecology Return Visit  SUBJECTIVE  History of Present Illness: Kerry [name] is a 72 y.o. female seen in follow-up for incontinence.   Reports that she had some vaginal bleeding in August on her pads. She also had covid in August and had no bladder control at the time. It did get better for sometime, but has started again.   Urodynamic Impression:  1. Sensation was normal; capacity was normal 2. Stress Incontinence was demonstrated at normal pressures; 3. Detrusor Overactivity was demonstrated with and without leakage. 4. Emptying was normal with a normal PVR, a sustained detrusor contraction present,  abdominal straining present, normal urethral sphincter activity on EMG.  Past Medical History: Patient  has a past medical history of Abnormal uterine bleeding, Allergic rhinitis, cause unspecified, Arthritis of knee, right, Bursitis of left shoulder, Cherry hemangioma (07/28/2015), Chronic pain, Coronary artery disease, Dyspareunia, Elevated cholesterol, Elevated uric acid in blood (2021), Fibroid, Foot fracture, left (01/2015), GERD (gastroesophageal reflux disease), HTN (hypertension), Interstitial lung disease (HCC), Kidney stones, Sleep apnea, and Urinary incontinence.   Past Surgical History: She  has a past surgical history that includes Umbilical hernia repair (2010); Carpal tunnel release (1998); dental implants (2010); Esophageal dilation (2010); Hammer toe surgery; Septoplasty; Rotator cuff repair (Right, 09/2011); Pelvic laparoscopy (1990);  dilatation and curettage (2000); Rotator cuff repair (Left, 11/11/2013); Cataract extraction (Bilateral); Dental surgery; Carpal tunnel release (Left, 05/21/2020); Dilatation & curettage/hysteroscopy with myosure (N/A, 08/22/2021); Operative ultrasound (N/A, 08/22/2021); LEFT HEART CATH AND CORONARY ANGIOGRAPHY (N/A, 01/25/2022); and CORONARY STENT INTERVENTION (N/A, 01/26/2022).   Medications: She has a current medication  list which includes the following prescription(s): amlodipine, ascorbic acid, aspirin ec, carboxymethylcellulose, vitamin d3, cinnamon, clopidogrel, coenzyme q10, cyanocobalamin, cyanocobalamin, duloxetine, vitamin k2, metoprolol tartrate, misc natural products, multiple vitamins-minerals, nitroglycerin, NON FORMULARY, nystatin, fish oil, probiotic product, saline, sodium chloride-xylitol, and tart cherry.   Allergies: Patient is allergic to meloxicam, molds & smuts, cefixime, celebrex [celecoxib], cobalt, gabapentin, levofloxacin, molybdenum, other, penicillins, robaxin [methocarbamol], statins, tomato, adhesive [tape], dust mite extract, tetracycline hcl, and tetracyclines & related.   Social History: Patient  reports that she has never smoked. She has never used smokeless tobacco. She reports that she does not drink alcohol and does not use drugs.      OBJECTIVE     Physical Exam: Vitals:   10/25/22 1340  BP: (!) 165/91  Pulse: (!) 101   Gen: No apparent distress, A&O x 3.  Detailed Urogynecologic Evaluation:  Deferred.    Pelvis US 10/18/22 IMPRESSION: 1. Indeterminate solid echogenic mass within the cervix is indeterminate measuring 2.1 x 1.8 x 1.9 cm. Advise further evaluation with direct visualization. Additionally, contrast enhanced pelvic MRI may be helpful for more definitiv imaging characterization. 2. Endometrial thickness is abnormal for a postmenopausal female measuring 9.2 mm. Endometrial sampling should be considered to exclude carcinoma. 3. Uterine fibroids. 4. Nonvisualization of the ovaries.  ASSESSMENT AND PLAN    Kerry [name] is a 72 y.o. with:  1. Urinary frequency   2. Overactive bladder   3. SUI (stress urinary incontinence, female)     1. OAB - Reviewed options of medication of tibial nerve stimulation. - She will do PTNS. Will have her fill a baseline bladder diary.   2. SUI - Reviewed options of pessary or urethral bulking. Would avoid surgery  due to recent stent placement.  - She prefers urethral bulking but will need to discuss with Cardiology regarding holding plavix. Will send letter to Dr Tenny Craw. She was  told she needs to take it for a year so may be able to wait until after her 1 year in January to do the procedure.   Return for PTNS. She will follow up with Dr Edward Jolly regarding vaginal bleeding and thickened endometrium.   Marguerita Beards, MD

## 2022-10-25 NOTE — Assessment & Plan Note (Signed)
Chronic exacerbation noted, discussed which activities to do and which ones to avoid.  Increase activity slowly over the course of next several weeks.  Discussed which activities to do and which ones to avoid.  Patient does have other health issues and is awaiting seeing urogynecology yesterday near future.

## 2022-10-25 NOTE — Patient Instructions (Signed)
See me in 4-6 weeks

## 2022-10-26 NOTE — Telephone Encounter (Signed)
See result note from today 10/26/22.   I recommend attempt at office endometrial biopsy with me.   Her endometrial lining is thickened.  I will place the order.

## 2022-10-26 NOTE — Telephone Encounter (Signed)
Reviewed results with patient at visit.

## 2022-10-26 NOTE — Telephone Encounter (Signed)
PUS results in EPIC. Routing to Dr. Edward Jolly

## 2022-10-30 NOTE — Telephone Encounter (Signed)
LVMTCB. Pt scheduled for EMB on 11/22/2022.

## 2022-10-31 ENCOUNTER — Encounter: Payer: Self-pay | Admitting: Obstetrics and Gynecology

## 2022-10-31 ENCOUNTER — Ambulatory Visit (INDEPENDENT_AMBULATORY_CARE_PROVIDER_SITE_OTHER): Payer: Medicare Other | Admitting: Obstetrics and Gynecology

## 2022-10-31 VITALS — BP 188/82 | HR 105

## 2022-10-31 DIAGNOSIS — N3281 Overactive bladder: Secondary | ICD-10-CM

## 2022-10-31 DIAGNOSIS — R350 Frequency of micturition: Secondary | ICD-10-CM

## 2022-10-31 NOTE — Progress Notes (Signed)
[facility]  PTNS VISIT  CC:  Overactive bladder  72 y.o. with refractory overactive bladder who presents for percutaneous tibial nerve stimulation. The patient presents for PTNS session # 1.  Patient completed a bladder diary  She is urinating 5-9 times daily with urgency almost every 2 hours and has leakage approximately 7 times per day. She reports some nighttime urgency and frequency.   For more in-depth details please see bladder diary which will be scanned into chart.    Procedure: The patient was placed in the sitting position and the right lower extremity was prepped in the usual fashion. The PTNS needle was then inserted at a 60 degree angle, 5 cm cephalad and 2 cm posterior to the medial malleolus. The PTNS unit was then programmed and an optimal response was noted at 6 milliamps. The PTNS stimulation was then performed at this setting for 30 minutes without incident and the patient tolerated the procedure well. The needle was removed and hemostasis was noted.   The pt will return in 1 week for PTNS session # 2. All questions were answered.

## 2022-11-06 ENCOUNTER — Encounter: Payer: Self-pay | Admitting: Family Medicine

## 2022-11-07 ENCOUNTER — Encounter: Payer: Self-pay | Admitting: Obstetrics and Gynecology

## 2022-11-07 ENCOUNTER — Ambulatory Visit (INDEPENDENT_AMBULATORY_CARE_PROVIDER_SITE_OTHER): Payer: Medicare Other | Admitting: Obstetrics and Gynecology

## 2022-11-07 VITALS — BP 178/80 | HR 86

## 2022-11-07 DIAGNOSIS — N3281 Overactive bladder: Secondary | ICD-10-CM | POA: Diagnosis not present

## 2022-11-07 DIAGNOSIS — R350 Frequency of micturition: Secondary | ICD-10-CM

## 2022-11-07 NOTE — Progress Notes (Signed)
[facility]  PTNS VISIT  CC:  Overactive bladder  72 y.o. with refractory overactive bladder who presents for percutaneous tibial nerve stimulation. The patient presents for PTNS session # 2.   Procedure: The patient was placed in the sitting position and the left lower extremity was prepped in the usual fashion. The PTNS needle was then inserted at a 60 degree angle, 5 cm cephalad and 2 cm posterior to the medial malleolus. The PTNS unit was then programmed and an optimal response was noted at 3 milliamps. The PTNS stimulation was then performed at this setting for 30 minutes without incident and the patient tolerated the procedure well. The needle was removed and hemostasis was noted.   The pt will return in 1 week for PTNS session # 3. All questions were answered.

## 2022-11-08 NOTE — Progress Notes (Signed)
GYNECOLOGY  VISIT   HPI: 72 y.o.   Married  Caucasian  female   G2P2002 with Patient's last menstrual period was 08/16/1992.   here for endometrial biopsy. Pt did notice periodic bleeding in August. She has a hx of renal stones.   US done periodically for FH ovarian cancer.  This is what prompted her recent pelvic US. She has known uterine fibroids and a cervical mass, which is stable and long term and consistent with probably fibroid. Had a pelvic MRI 05/16/08.   Status post hysteroscopy 08/22/21 with resection of endometrial mass and biopsy of partially intracavitary mass, consistent with expected partially submucous fibroid.    Final pathology showed benign endometrial polyp and smooth muscle.  No hyperplasia or malignancy.  Study Result - Pelvic US 10/25/22  Narrative & Impression  CLINICAL DATA:  Postmenopausal female with family history of cervical cancer.   EXAM: TRANSABDOMINAL AND TRANSVAGINAL ULTRASOUND OF PELVIS   TECHNIQUE: Both transabdominal and transvaginal ultrasound examinations of the pelvis were performed. Transabdominal technique was performed for global imaging of the pelvis including uterus, ovaries, adnexal regions, and pelvic cul-de-sac. It was necessary to proceed with endovaginal exam following the transabdominal exam to visualize the endometrium.   COMPARISON:  None Available.   FINDINGS: Uterus   Measurements: 9.4 x 3.0 x 5.1 cm = volume: 74.3 mL. Anteverted. There are 2 fibroids identified within the fundus. The largest measures 2.5 x 2.5 x 1.8 cm.   Solid echogenic mass within the cervix is indeterminate measuring 2.1 x 1.8 x 1.9 cm.   Endometrium   Thickness: 9.2 mm.  No focal abnormality visualized.   Right ovary   Measurements: Not visualized.   Left ovary   Measurements: Not visualized   Other findings   No abnormal free fluid.   IMPRESSION: 1. Indeterminate solid echogenic mass within the cervix is indeterminate measuring 2.1  x 1.8 x 1.9 cm. Advise further evaluation with direct visualization. Additionally, contrast enhanced pelvic MRI may be helpful for more definitiv imaging characterization. 2. Endometrial thickness is abnormal for a postmenopausal female measuring 9.2 mm. Endometrial sampling should be considered to exclude carcinoma. 3. Uterine fibroids. 4. Nonvisualization of the ovaries.     Electronically Signed   By: Signa Kell M.D.   On: 10/25/2022 11:45     Doing PTNS therapy for urinary incontinence with urogynecology and getting good results.   GYNECOLOGIC HISTORY: Patient's last menstrual period was 08/16/1992. Contraception:  PMP Menopausal hormone therapy:  n/a Last mammogram:  10/18/22 Breast Density Cat C, BI-RADS CAT 1 neg Last pap smear:   09/15/20 neg: HR HPV neg, 08/07/18 neg        OB History     Gravida  2   Para  2   Term  2   Preterm      AB      Living  2      SAB      IAB      Ectopic      Multiple      Live Births  2              Patient Active Problem List   Diagnosis Date Noted   Abnormal cardiovascular stress test 01/27/2022   CAD (coronary artery disease) 01/26/2022   CAD, multiple vessel 01/24/2022   Dizziness after extension of neck 11/29/2021   Degenerative cervical disc 04/10/2019   Carpal tunnel syndrome on left 02/27/2019   Left lumbar radiculopathy 12/28/2017   Nonallopathic  lesion of thoracic region 08/02/2017   Swelling of both lower extremities 01/18/2017   Nonallopathic lesion of sacral region 01/03/2017   Allergic contact dermatitis due to metals 12/16/2016   Abnormal gait 04/10/2016   Dyslipidemia 08/12/2015   Dyspnea on exertion 08/12/2015   Palpitations 08/12/2015   Pes anserine bursitis 08/10/2015   Rupture of left posterior tibialis tendon 03/25/2015   OSA (obstructive sleep apnea) 03/05/2015   Navicular fracture of ankle 01/21/2015   Sinus infection 12/25/2014   Uric acid arthropathy 03/23/2014   High plasma  oxalate 02/17/2014   Nonallopathic lesion of cervical region 12/19/2012   Fibroids 05/28/2012   Family history of ovarian cancer 05/28/2012   Strain of quadriceps muscle 04/17/2012   Nonallopathic lesion of costovertebral region 01/23/2012   Left shoulder pain 12/20/2011   Rotator cuff disorder 09/26/2011   Bilateral foot pain 09/20/2011   Arthralgia of multiple joints 07/26/2011   Arthritis of knee, degenerative 07/26/2011   Arthritis of hand, degenerative 07/26/2011   Low back pain 07/26/2011   Vitamin B 12 deficiency 03/05/2011   TMJ (temporomandibular joint syndrome) 03/05/2011   Pes planus 03/01/2011   Right knee pain 03/01/2011   Greater trochanteric bursitis of left hip 03/01/2011   Nonallopathic lesion of lumbosacral region 01/27/2011   Obesity 12/29/2010   Fatigue 12/29/2010   Allergic rhinitis    Chronic pain    GERD (gastroesophageal reflux disease)    Essential hypertension    Sleep apnea     Past Medical History:  Diagnosis Date   Abnormal uterine bleeding    Allergic rhinitis, cause unspecified    Arthritis of knee, right    Bursitis of left shoulder    Cherry hemangioma 07/28/2015   vulva   Chronic pain    Coronary artery disease    Dyspareunia    Elevated cholesterol    Elevated uric acid in blood 2021   Fibroid    Foot fracture, left 01/2015   and torn tendons   GERD (gastroesophageal reflux disease)    HTN (hypertension)    Interstitial lung disease (HCC)    Kidney stones    Sleep apnea    Urinary incontinence     Past Surgical History:  Procedure Laterality Date    dilatation and curettage  2000   CARPAL TUNNEL RELEASE  1998   CARPAL TUNNEL RELEASE Left 05/21/2020   CATARACT EXTRACTION Bilateral    CORONARY STENT INTERVENTION N/A 01/26/2022   Procedure: CORONARY STENT INTERVENTION;  Surgeon: Kathleene Hazel, MD;  Location: [facility];  Service: Cardiovascular;  Laterality: N/A;   dental implants  2010   DENTAL SURGERY      DILATATION & CURETTAGE/HYSTEROSCOPY WITH MYOSURE N/A 08/22/2021   Procedure: DILATATION & CURETTAGE/HYSTEROSCOPY WITH MYOSURE RESECTION OF ENDOMETRIAL MASS;  Surgeon: Patton Salles, MD;  Location: [facility];  Service: Gynecology;  Laterality: N/A;   ESOPHAGEAL DILATION  2010   HAMMER TOE SURGERY     rt   LEFT HEART CATH AND CORONARY ANGIOGRAPHY N/A 01/25/2022   Procedure: LEFT HEART CATH AND CORONARY ANGIOGRAPHY;  Surgeon: Lennette Bihari, MD;  Location: [facility];  Service: Cardiovascular;  Laterality: N/A;   OPERATIVE ULTRASOUND N/A 08/22/2021   Procedure: OPERATIVE ULTRASOUND GUIDED HYSTEROSCOPY;  Surgeon: Patton Salles, MD;  Location: [facility];  Service: Gynecology;  Laterality: N/A;   PELVIC LAPAROSCOPY  1990   ROTATOR CUFF REPAIR Right 09/2011   Dr. Ave Filter  ROTATOR CUFF REPAIR Left 11/11/2013   Dr. Ave Filter    SEPTOPLASTY     UMBILICAL HERNIA REPAIR  2010   Patient reports mesh was used for the repair.    Current Outpatient Medications  Medication Sig Dispense Refill   amLODipine (NORVASC) 5 MG tablet Take 1 tablet (5 mg total) by mouth daily. 90 tablet 3   ascorbic acid (VITAMIN C) 250 MG tablet Take 250 mg by mouth daily.     aspirin EC 81 MG tablet Take 1 tablet (81 mg total) by mouth daily. Swallow whole. 100 tablet 3   carboxymethylcellulose (REFRESH PLUS) 0.5 % SOLN 1 drop 2 (two) times daily as needed (for dry eyes).     Cholecalciferol (VITAMIN D3) 5000 units CAPS Take 5,000 Units by mouth. Every other day     CINNAMON PO Take 2.5 mLs by mouth daily.     clopidogrel (PLAVIX) 75 MG tablet Take 1 tablet (75 mg total) by mouth daily. 90 tablet 3   Coenzyme Q10 200 MG capsule Take 200 mg by mouth daily.     cyanocobalamin (VITAMIN B12) 1000 MCG tablet Take 1,000 mcg by mouth every Monday, Wednesday, and Friday.     cyanocobalamin (VITAMIN B12) 1000 MCG/ML injection Inject 1,000 mcg into the muscle once. Once a  month     DULoxetine (CYMBALTA) 20 MG capsule Take 1 capsule (20 mg total) by mouth daily. 30 capsule 0   Menatetrenone (VITAMIN K2) 100 MCG TABS Take 180 mcg by mouth. Every 6th day. MK-7     metoprolol tartrate (LOPRESSOR) 25 MG tablet Take 25 mg by mouth 2 (two) times daily.     MISC NATURAL PRODUCTS PO Take by mouth. Black currant seed oil     Multiple Vitamins-Minerals (SUPER MULTI-VITAMIN PO) Take 1 capsule by mouth daily. Solgar Formula VM-75     nitroGLYCERIN (NITROSTAT) 0.4 MG SL tablet Place 1 tablet (0.4 mg total) under the tongue every 5 (five) minutes x 3 doses as needed for chest pain. 25 tablet 1   NON FORMULARY Take 3 fluid ounces by mouth daily. Goat Kiefer     nystatin (MYCOSTATIN) 100000 UNIT/ML suspension TAKE 5 ML BY MOUTH  4 TIMES DAILY 60 mL 0   Omega-3 Fatty Acids (FISH OIL) 500 MG CAPS Take 500 mg by mouth daily.     Probiotic Product (PROBIOTIC ADVANCED PO) Take by mouth. Patient states taking 5 billion, one capsule daily. Suprema Dophilus probiotic     SALINE NA Place 1 spray into the nose as needed (allergies).     Sodium Chloride-Xylitol (XLEAR SINUS CARE SPRAY NA) Place into the nose. 2 sprays at bedtime     TART CHERRY PO Take 2 tablets by mouth daily.     No current facility-administered medications for this visit.     ALLERGIES: Meloxicam, Molds & smuts, Cefixime, Celebrex [celecoxib], Cobalt, Gabapentin, Levofloxacin, Molybdenum, Other, Penicillins, Robaxin [methocarbamol], Statins, Tomato, Adhesive [tape], Dust mite extract, Tetracycline hcl, and Tetracyclines & related  Family History  Problem Relation Age of Onset   Rheumatologic disease Mother    Diabetes Mother    Heart attack Father    Rheumatologic disease Sister    Arthritis Sister        --Rheumatoid arthritis   Ovarian cancer Sister 65   Crohn's disease Brother    Kidney failure Brother    Ulcerative colitis Brother    Glaucoma Brother     Social History   Socioeconomic History    Marital status:  Married    Spouse name: Not on file   Number of children: Not on file   Years of education: Not on file   Highest education level: Not on file  Occupational History   Occupation: Retired  Tobacco Use   Smoking status: Never   Smokeless tobacco: Never  Vaping Use   Vaping status: Never Used  Substance and Sexual Activity   Alcohol use: No    Alcohol/week: 0.0 standard drinks of alcohol   Drug use: No   Sexual activity: Not Currently    Partners: Male    Birth control/protection: Post-menopausal  Other Topics Concern   Not on file  Social History Narrative   Are you right handed or left handed? Right handed   Are you currently employed ? No retired   What is your current occupation?   Do you live at home alone? No   Who lives with you? Lives with husband.    What type of home do you live in: 1 story or 2 story? Two story home.       Social Determinants of Health   Financial Resource Strain: Not on file  Food Insecurity: No Food Insecurity (01/25/2022)   Hunger Vital Sign    Worried About Running Out of Food in the Last Year: Never true    Ran Out of Food in the Last Year: Never true  Transportation Needs: No Transportation Needs (01/25/2022)   PRAPARE - Administrator, Civil Service (Medical): No    Lack of Transportation (Non-Medical): No  Physical Activity: Not on file  Stress: Not on file  Social Connections: Not on file  Intimate Partner Violence: Not At Risk (01/25/2022)   Humiliation, Afraid, Rape, and Kick questionnaire    Fear of Current or Ex-Partner: No    Emotionally Abused: No    Physically Abused: No    Sexually Abused: No    Review of Systems  All other systems reviewed and are negative.   PHYSICAL EXAMINATION:    BP 134/82 (BP Location: Left Arm, Patient Position: Sitting, Cuff Size: Large)   Pulse 97   LMP 08/16/1992   SpO2 96%     General appearance: alert, cooperative and appears stated age   Pelvic: External  genitalia:  no lesions              Urethra:  normal appearing urethra with no masses, tenderness or lesions              Bartholins and Skenes: normal                 Vagina: normal appearing vagina with normal color and discharge, no lesions              Cervix: no lesions                Bimanual Exam:  Uterus:  normal size, contour, position, consistency, mobility, non-tender              Adnexa: no mass, fullness, tenderness    Endometrial biopsy  Consent done.  Hibiclens prep.  Paracervical block with 10 cc 1% lidocaine lot [id number], exp Sept 2026.  Os finder used to dilate cervix.  Pipelle passes x 2 to 8 cm.  Tissue to pathology.  No complications.  Minimal EBL.  Chaperone was present for exam:  Warren Lacy, CMA  ASSESSMENT  Postmenopausal bleeding.  Thickened endometrium.  Cervical cancer screening.  FH ovarian cancer. Ovaries not seen on  recent pelvic ultrasound.   PLAN  Pelvic US reviewed.  Potential etiologies of bleeding reviewed:  infection, polyp, precancer and cancer.  Etiologies of endometrial thickening reviewed:  polyp, fibroid, precancer, and cancer.  Prior hysteroscopy and pathology report reviewed.  Pap and HR HPV collected.  FU EMB. Final plan to follow.    30 min  total time was spent for this patient encounter, including preparation, face-to-face counseling with the patient, coordination of care, and documentation of the encounter in addition to doing the endometrial biopsy.

## 2022-11-14 ENCOUNTER — Ambulatory Visit (INDEPENDENT_AMBULATORY_CARE_PROVIDER_SITE_OTHER): Payer: Medicare Other | Admitting: Obstetrics and Gynecology

## 2022-11-14 ENCOUNTER — Encounter: Payer: Self-pay | Admitting: Obstetrics and Gynecology

## 2022-11-14 VITALS — BP 199/76 | HR 98

## 2022-11-14 DIAGNOSIS — R350 Frequency of micturition: Secondary | ICD-10-CM

## 2022-11-14 DIAGNOSIS — N3281 Overactive bladder: Secondary | ICD-10-CM

## 2022-11-14 NOTE — Progress Notes (Signed)
[facility]  PTNS VISIT  CC:  Overactive bladder  72 y.o. with refractory overactive bladder who presents for percutaneous tibial nerve stimulation. The patient presents for PTNS session # 3.   Procedure: The patient was placed in the sitting position and the right lower extremity was prepped in the usual fashion. The PTNS needle was then inserted at a 60 degree angle, 5 cm cephalad and 2 cm posterior to the medial malleolus. The PTNS unit was then programmed and an optimal response was noted at 7 milliamps. The PTNS stimulation was then performed at this setting for 30 minutes without incident and the patient tolerated the procedure well. The needle was removed and hemostasis was noted.   The pt will return in 1 week for PTNS session # 4. All questions were answered.

## 2022-11-17 DIAGNOSIS — I639 Cerebral infarction, unspecified: Secondary | ICD-10-CM

## 2022-11-17 HISTORY — DX: Cerebral infarction, unspecified: I63.9

## 2022-11-20 NOTE — Telephone Encounter (Signed)
No PFT scheduled per Rubye Oaks NP they do not need it at this time.

## 2022-11-21 ENCOUNTER — Ambulatory Visit: Payer: Medicare Other | Admitting: Obstetrics and Gynecology

## 2022-11-22 ENCOUNTER — Other Ambulatory Visit (HOSPITAL_COMMUNITY)
Admission: RE | Admit: 2022-11-22 | Discharge: 2022-11-22 | Disposition: A | Discharge status: Home / Self Care | Payer: Medicare Other | Source: Ambulatory Visit | Attending: Obstetrics and Gynecology | Admitting: Obstetrics and Gynecology

## 2022-11-22 ENCOUNTER — Other Ambulatory Visit: Payer: Self-pay | Admitting: *Deleted

## 2022-11-22 ENCOUNTER — Ambulatory Visit (INDEPENDENT_AMBULATORY_CARE_PROVIDER_SITE_OTHER): Payer: Medicare Other | Admitting: Obstetrics and Gynecology

## 2022-11-22 ENCOUNTER — Encounter: Payer: Self-pay | Admitting: Obstetrics and Gynecology

## 2022-11-22 VITALS — BP 134/82 | HR 97

## 2022-11-22 DIAGNOSIS — Z01419 Encounter for gynecological examination (general) (routine) without abnormal findings: Secondary | ICD-10-CM | POA: Insufficient documentation

## 2022-11-22 DIAGNOSIS — I10 Essential (primary) hypertension: Secondary | ICD-10-CM

## 2022-11-22 DIAGNOSIS — E785 Hyperlipidemia, unspecified: Secondary | ICD-10-CM

## 2022-11-22 DIAGNOSIS — Z1151 Encounter for screening for human papillomavirus (HPV): Secondary | ICD-10-CM | POA: Diagnosis not present

## 2022-11-22 DIAGNOSIS — R9389 Abnormal findings on diagnostic imaging of other specified body structures: Secondary | ICD-10-CM | POA: Insufficient documentation

## 2022-11-22 DIAGNOSIS — Z124 Encounter for screening for malignant neoplasm of cervix: Secondary | ICD-10-CM

## 2022-11-22 DIAGNOSIS — N950 Postmenopausal bleeding: Secondary | ICD-10-CM | POA: Insufficient documentation

## 2022-11-22 DIAGNOSIS — Z8041 Family history of malignant neoplasm of ovary: Secondary | ICD-10-CM

## 2022-11-22 DIAGNOSIS — I2510 Atherosclerotic heart disease of native coronary artery without angina pectoris: Secondary | ICD-10-CM

## 2022-11-22 DIAGNOSIS — N858 Other specified noninflammatory disorders of uterus: Secondary | ICD-10-CM | POA: Diagnosis not present

## 2022-11-22 DIAGNOSIS — R7303 Prediabetes: Secondary | ICD-10-CM

## 2022-11-22 NOTE — Patient Instructions (Signed)
 Endometrial Biopsy  An endometrial biopsy is a procedure where a tissue sample is removed from the lining of the uterus. This lining is called the endometrium. The tissue sample is then sent to a lab for testing. You may have this type of biopsy to check for: Cancer. Infection. Growths called polyps. Uterine bleeding that can't be explained. Tell a health care provider about: Any allergies you have. All medicines you're taking including vitamins, herbs, eye drops, creams, and over-the-counter medicines. Any problems you or family members have had with anesthesia. Any bleeding problems you have. Any surgeries you have had. Any medical problems you have. Whether you're pregnant or may be pregnant. What are the risks? Your health care provider will talk with you about risks. These may include: Bleeding. Infection. Allergic reactions to medicines. Damage to the wall of the uterus. This is rare. What happens before the procedure? Keep track of your period. You may need to have this biopsy when you're not having your period. Ask your provider about: Changing or stopping your regular medicines. These include any diabetes medicines or blood thinners you take. Taking medicines such as aspirin and ibuprofen. These medicines can thin your blood. Do not take them unless your provider tells you to. Taking over-the-counter medicines, vitamins, herbs, and supplements. Bring a pad with you. You may need to wear one after the biopsy. Plan to have a responsible adult take you home from the hospital or clinic. You won't be allowed to drive. What happens during the procedure? A tool will be put into your vagina to hold it open. This helps your provider see the cervix. The cervix is the lowest part of the uterus. Your cervix will be cleaned with a solution that kills germs. You will be given anesthesia. This keeps you from feeling pain. It will numb your cervix. A tool called forceps will be used to  hold your cervix steady. A thin tool called a uterine sound will be put through your cervix. It will be used to: Find the length of your uterus. Find where to take the sample from. A soft tube called a catheter will be put into your uterus. The catheter will remove a tissue sample. The tube and tools will be removed. The sample will be sent to a lab for testing. The procedure may vary among providers and hospitals. What happens after the procedure? Your blood pressure, heart rate, breathing rate, and blood oxygen level will be monitored until you leave the hospital or clinic. It's up to you to get the results of your procedure. Ask your provider, or the department that is doing the procedure, when your results will be ready. This information is not intended to replace advice given to you by your health care provider. Make sure you discuss any questions you have with your health care provider. Document Revised: 03/14/2022 Document Reviewed: 03/14/2022 Elsevier Patient Education  2024 [employer].

## 2022-11-23 ENCOUNTER — Ambulatory Visit: Payer: Medicare Other | Admitting: Obstetrics and Gynecology

## 2022-11-23 ENCOUNTER — Encounter: Payer: Self-pay | Admitting: Obstetrics and Gynecology

## 2022-11-23 ENCOUNTER — Ambulatory Visit: Payer: Medicare Other | Attending: Internal Medicine

## 2022-11-23 VITALS — BP 186/74 | HR 98

## 2022-11-23 DIAGNOSIS — I2510 Atherosclerotic heart disease of native coronary artery without angina pectoris: Secondary | ICD-10-CM | POA: Diagnosis not present

## 2022-11-23 DIAGNOSIS — E785 Hyperlipidemia, unspecified: Secondary | ICD-10-CM | POA: Diagnosis not present

## 2022-11-23 DIAGNOSIS — R7303 Prediabetes: Secondary | ICD-10-CM | POA: Diagnosis not present

## 2022-11-23 DIAGNOSIS — N3281 Overactive bladder: Secondary | ICD-10-CM

## 2022-11-23 DIAGNOSIS — I10 Essential (primary) hypertension: Secondary | ICD-10-CM | POA: Diagnosis not present

## 2022-11-23 DIAGNOSIS — R350 Frequency of micturition: Secondary | ICD-10-CM

## 2022-11-23 NOTE — Progress Notes (Signed)
[facility]  PTNS VISIT  CC:  Overactive bladder  72 y.o. with refractory overactive bladder who presents for percutaneous tibial nerve stimulation. The patient presents for PTNS session # 4.   Procedure: The patient was placed in the sitting position and the left lower extremity was prepped in the usual fashion. The PTNS needle was then inserted at a 60 degree angle, 5 cm cephalad and 2 cm posterior to the medial malleolus. The PTNS unit was then programmed and an optimal response was noted at 7 milliamps. The PTNS stimulation was then performed at this setting for 30 minutes without incident and the patient tolerated the procedure well. The needle was removed and hemostasis was noted.   The pt will return in 1 week for PTNS session # 5. All questions were answered.

## 2022-11-23 NOTE — Progress Notes (Signed)
[name] [address] [place] [address] Phone: [phone] Subjective:   [name], am serving as a scribe for Dr. Antoine Primas.  I'm seeing this patient by the request  of:  Street, [name], MD  CC: Back and neck pain follow-up  [id number]  Kerry [name] is a 72 y.o. female coming in with complaint of back and neck pain. OMT on 10/25/2022. Patient states   Medications patient has been prescribed: Cymbalta  Taking:     For overactive bladder has been seen for posterior and percutaneous tibial nerve stimulation patient is scheduled in 1 week for another session.  Doing patient's chart has undergone a endometrial biopsy but reviewing labs does not seem that report is back yet.  Consider a MRI of the pelvis with contrast  Reviewed prior external information including notes and imaging from previsou exam, outside providers and external EMR if available.   As well as notes that were available from care everywhere and other healthcare systems.  Past medical history, social, surgical and family history all reviewed in electronic medical record.  No pertanent information unless stated regarding to the chief complaint.   Past Medical History:  Diagnosis Date   Abnormal uterine bleeding    Allergic rhinitis, cause unspecified    Arthritis of knee, right    Bursitis of left shoulder    Cherry hemangioma 07/28/2015   vulva   Chronic pain    Coronary artery disease    Dyspareunia    Elevated cholesterol    Elevated uric acid in blood 2021   Fibroid    Foot fracture, left 01/2015   and torn tendons   GERD (gastroesophageal reflux disease)    HTN (hypertension)    Interstitial lung disease (HCC)    Kidney stones    Sleep apnea    Urinary incontinence     Allergies  Allergen Reactions   Meloxicam Other (See Comments)    Made patient have stomach pain ,sore throat, joint pains, face flush    Molds & Smuts  Other (See Comments), Palpitations, Shortness Of Breath and Swelling   Cefixime Swelling   Celebrex [Celecoxib] Swelling   Cobalt Other (See Comments)    Per allergist   Gabapentin Other (See Comments)    Joint pain   Levofloxacin Other (See Comments)    Muscle and joint pain   Molybdenum Other (See Comments)    Per allergist   Other Other (See Comments)    TEQUIN. TEQUIN - JOINT AND MUSCLE PAIN Other Reaction: painful joints TEQUIN. TEQUIN - JOINT AND MUSCLE PAIN Tantal Metal   Penicillins Hives   Robaxin [Methocarbamol]    Statins Other (See Comments)    Muscle pain   Tomato Hives   Adhesive [Tape] Rash    Sensitive to EKG leads; sensitive to 20M Micropore tape   Dust Mite Extract Itching, Other (See Comments) and Palpitations   Tetracycline Hcl Rash    Can take Doxy   Tetracyclines & Related Rash     Review of Systems:  No headache, visual changes, nausea, vomiting, diarrhea, constipation, dizziness, abdominal pain, skin rash, fevers, chills, night sweats, weight loss, swollen lymph nodes, body aches, joint swelling, chest pain, shortness of breath, mood changes. POSITIVE muscle aches, has had some vaginal bleeding recently.  Objective  Height 4\' 10"  (1.473 m), weight 234 lb (106.1 kg), last menstrual period 08/16/1992.   General: No apparent distress alert and oriented x3 mood and affect normal,  dressed appropriately.  HEENT: Pupils equal, extraocular movements intact  Respiratory: Patient's speak in full sentences and does not appear short of breath  Cardiovascular: No lower extremity edema, non tender, no erythema  Gait antalgic MSK:  Back significant tightness is noted.  Poor core strength.  Osteopathic findings  C3 flexed rotated and side bent right C6 flexed rotated and side bent left T3 extended rotated and side bent right inhaled rib T9 extended rotated and side bent left L2 flexed rotated and side bent right L5 flexed rotated and side bent right Sacrum  right on right       Assessment and Plan:  Left lumbar radiculopathy Patient is improving actually with the tibial nerve stimulation that is also helping her urinary urgency. .  Does respond somewhat to osteopathic manipulation as well.  Hopefully patient will continue to be more active.  Still difficult to find medications or anything to help her with her treatment of the chronic pain.  Have discussed the Cymbalta previously.  No significant change in management based on this.  Follow-up again in 6 to 8 weeks  Postmenopausal bleeding Patient is seeing urogyn.  Patient recently went under a endometrial biopsy and awaiting pathological results.  Patient on ultrasound did have a potential mass noted as well and there was a recommendation for the possibility of an MRI with contrast.  We will order that just because we know how long that there has been a delay with radiology.  I would like to have this in just in case any further imaging is needed but will defer to the urogyn recommendations.    Nonallopathic problems  Decision today to treat with OMT was based on Physical Exam  After verbal consent patient was treated with  ME, FPR techniques in cervical, rib, thoracic, lumbar, and sacral  areas  Patient tolerated the procedure well with improvement in symptoms  Patient given exercises, stretches and lifestyle modifications  See medications in patient instructions if given  Patient will follow up in 4-8 weeks      The above documentation has been reviewed and is accurate and complete Judi Saa, DO        Note: This dictation was prepared with Dragon dictation along with smaller phrase technology. Any transcriptional errors that result from this process are unintentional.

## 2022-11-24 ENCOUNTER — Encounter: Payer: Self-pay | Admitting: Family Medicine

## 2022-11-24 ENCOUNTER — Ambulatory Visit (INDEPENDENT_AMBULATORY_CARE_PROVIDER_SITE_OTHER): Payer: Medicare Other | Admitting: Family Medicine

## 2022-11-24 VITALS — Ht <= 58 in | Wt 234.0 lb

## 2022-11-24 DIAGNOSIS — M9903 Segmental and somatic dysfunction of lumbar region: Secondary | ICD-10-CM

## 2022-11-24 DIAGNOSIS — M5416 Radiculopathy, lumbar region: Secondary | ICD-10-CM | POA: Diagnosis not present

## 2022-11-24 DIAGNOSIS — M9904 Segmental and somatic dysfunction of sacral region: Secondary | ICD-10-CM

## 2022-11-24 DIAGNOSIS — M9901 Segmental and somatic dysfunction of cervical region: Secondary | ICD-10-CM

## 2022-11-24 DIAGNOSIS — N950 Postmenopausal bleeding: Secondary | ICD-10-CM | POA: Diagnosis not present

## 2022-11-24 DIAGNOSIS — R1900 Intra-abdominal and pelvic swelling, mass and lump, unspecified site: Secondary | ICD-10-CM

## 2022-11-24 DIAGNOSIS — M9908 Segmental and somatic dysfunction of rib cage: Secondary | ICD-10-CM | POA: Diagnosis not present

## 2022-11-24 DIAGNOSIS — M9902 Segmental and somatic dysfunction of thoracic region: Secondary | ICD-10-CM

## 2022-11-24 LAB — LIPID PANEL
Chol/HDL Ratio: 4.8 ratio — ABNORMAL HIGH (ref 0.0–4.4)
Cholesterol, Total: 227 mg/dL — ABNORMAL HIGH (ref 100–199)
HDL: 47 mg/dL (ref >39)
LDL Chol Calc (NIH): 155 mg/dL — ABNORMAL HIGH (ref 0–99)
Triglycerides: 137 mg/dL (ref 0–149)
VLDL Cholesterol Cal: 25 mg/dL (ref 5–40)

## 2022-11-24 LAB — NMR, LIPOPROFILE
Cholesterol, Total: 235 mg/dL — ABNORMAL HIGH (ref 100–199)
HDL Particle Number: 33.5 umol/L (ref >=30.5)
HDL-C: 50 mg/dL (ref >39)
LDL Particle Number: 2200 nmol/L — ABNORMAL HIGH (ref <1000)
LDL Size: 20.4 nm — ABNORMAL LOW (ref >20.5)
LDL-C (NIH Calc): 159 mg/dL — ABNORMAL HIGH (ref 0–99)
LP-IR Score: 81 — ABNORMAL HIGH (ref <=45)
Small LDL Particle Number: 1359 nmol/L — ABNORMAL HIGH (ref <=527)
Triglycerides: 145 mg/dL (ref 0–149)

## 2022-11-24 LAB — HEMOGLOBIN A1C
Est. average glucose Bld gHb Est-mCnc: 123 mg/dL
Hgb A1c MFr Bld: 5.9 % — ABNORMAL HIGH (ref 4.8–5.6)

## 2022-11-24 LAB — COMPREHENSIVE METABOLIC PANEL
ALT: 17 [IU]/L (ref 0–32)
AST: 18 [IU]/L (ref 0–40)
Albumin: 4.1 g/dL (ref 3.8–4.8)
Alkaline Phosphatase: 53 [IU]/L (ref 44–121)
BUN/Creatinine Ratio: 24 (ref 12–28)
BUN: 14 mg/dL (ref 8–27)
Bilirubin Total: 0.4 mg/dL (ref 0.0–1.2)
CO2: 20 mmol/L (ref 20–29)
Calcium: 9.1 mg/dL (ref 8.7–10.3)
Chloride: 105 mmol/L (ref 96–106)
Creatinine, Ser: 0.58 mg/dL (ref 0.57–1.00)
Globulin, Total: 2.7 g/dL (ref 1.5–4.5)
Glucose: 98 mg/dL (ref 70–99)
Potassium: 3.9 mmol/L (ref 3.5–5.2)
Sodium: 142 mmol/L (ref 134–144)
Total Protein: 6.8 g/dL (ref 6.0–8.5)
eGFR: 95 mL/min/{1.73_m2} (ref >59)

## 2022-11-24 LAB — APOLIPOPROTEIN B: Apolipoprotein B: 143 mg/dL — ABNORMAL HIGH (ref <90)

## 2022-11-24 LAB — SURGICAL PATHOLOGY

## 2022-11-24 NOTE — Assessment & Plan Note (Signed)
Patient is improving actually with the tibial nerve stimulation that is also helping her urinary urgency. .  Does respond somewhat to osteopathic manipulation as well.  Hopefully patient will continue to be more active.  Still difficult to find medications or anything to help her with her treatment of the chronic pain.  Have discussed the Cymbalta previously.  No significant change in management based on this.  Follow-up again in 6 to 8 weeks

## 2022-11-24 NOTE — Assessment & Plan Note (Signed)
Patient is seeing urogyn.  Patient recently went under a endometrial biopsy and awaiting pathological results.  Patient on ultrasound did have a potential mass noted as well and there was a recommendation for the possibility of an MRI with contrast.  We will order that just because we know how long that there has been a delay with radiology.  I would like to have this in just in case any further imaging is needed but will defer to the urogyn recommendations.

## 2022-11-24 NOTE — Patient Instructions (Addendum)
MRI pelvis w contrast [phone] See me in 6-8 weeks

## 2022-11-27 LAB — CYTOLOGY - PAP
Comment: NEGATIVE
Diagnosis: NEGATIVE
Diagnosis: REACTIVE
High risk HPV: NEGATIVE

## 2022-11-28 ENCOUNTER — Other Ambulatory Visit: Payer: Self-pay

## 2022-11-28 DIAGNOSIS — E785 Hyperlipidemia, unspecified: Secondary | ICD-10-CM

## 2022-11-30 ENCOUNTER — Encounter: Payer: Self-pay | Admitting: Obstetrics and Gynecology

## 2022-11-30 ENCOUNTER — Ambulatory Visit: Payer: Medicare Other | Admitting: Obstetrics and Gynecology

## 2022-11-30 VITALS — BP 162/82 | HR 88

## 2022-11-30 DIAGNOSIS — N3281 Overactive bladder: Secondary | ICD-10-CM | POA: Diagnosis not present

## 2022-11-30 DIAGNOSIS — R350 Frequency of micturition: Secondary | ICD-10-CM

## 2022-11-30 LAB — POCT URINALYSIS DIPSTICK
Bilirubin, UA: NEGATIVE
Blood, UA: NEGATIVE
Glucose, UA: NEGATIVE
Ketones, UA: NEGATIVE
Leukocytes, UA: NEGATIVE
Nitrite, UA: NEGATIVE
Protein, UA: NEGATIVE
Spec Grav, UA: 1.01 (ref 1.010–1.025)
Urobilinogen, UA: 0.2 U/dL
pH, UA: 6 (ref 5.0–8.0)

## 2022-11-30 NOTE — Progress Notes (Signed)
[facility]  PTNS VISIT  CC:  Overactive bladder  72 y.o. with refractory overactive bladder who presents for percutaneous tibial nerve stimulation. The patient presents for PTNS session # 5.   Procedure: The patient was placed in the sitting position and the right lower extremity was prepped in the usual fashion. The PTNS needle was then inserted at a 60 degree angle, 5 cm cephalad and 2 cm posterior to the medial malleolus. The PTNS unit was then programmed and an optimal response was noted at 6 milliamps. The PTNS stimulation was then performed at this setting for 30 minutes without incident and the patient tolerated the procedure well. The needle was removed and hemostasis was noted.    Of note we checked a urine as she reports she has had a regression in her symptoms. Urine was noted to be clear with no sign of infection.   The pt will return in 1 week for PTNS session # 6. All questions were answered.

## 2022-12-05 DIAGNOSIS — N938 Other specified abnormal uterine and vaginal bleeding: Secondary | ICD-10-CM | POA: Diagnosis not present

## 2022-12-05 DIAGNOSIS — E785 Hyperlipidemia, unspecified: Secondary | ICD-10-CM | POA: Diagnosis not present

## 2022-12-05 DIAGNOSIS — G720 Drug-induced myopathy: Secondary | ICD-10-CM | POA: Diagnosis not present

## 2022-12-05 DIAGNOSIS — Z0000 Encounter for general adult medical examination without abnormal findings: Secondary | ICD-10-CM | POA: Diagnosis not present

## 2022-12-05 DIAGNOSIS — T466X5A Adverse effect of antihyperlipidemic and antiarteriosclerotic drugs, initial encounter: Secondary | ICD-10-CM | POA: Diagnosis not present

## 2022-12-06 ENCOUNTER — Ambulatory Visit: Payer: Medicare Other | Admitting: Obstetrics and Gynecology

## 2022-12-06 ENCOUNTER — Ambulatory Visit: Payer: Medicare Other | Admitting: Adult Health

## 2022-12-06 VITALS — BP 172/86

## 2022-12-06 DIAGNOSIS — N3281 Overactive bladder: Secondary | ICD-10-CM | POA: Diagnosis not present

## 2022-12-06 DIAGNOSIS — R350 Frequency of micturition: Secondary | ICD-10-CM

## 2022-12-07 ENCOUNTER — Encounter: Payer: Self-pay | Admitting: Obstetrics and Gynecology

## 2022-12-07 ENCOUNTER — Ambulatory Visit: Payer: Medicare Other | Admitting: Adult Health

## 2022-12-07 DIAGNOSIS — R9389 Abnormal findings on diagnostic imaging of other specified body structures: Secondary | ICD-10-CM

## 2022-12-07 DIAGNOSIS — N950 Postmenopausal bleeding: Secondary | ICD-10-CM

## 2022-12-07 NOTE — Progress Notes (Signed)
[facility]  PTNS VISIT  CC:  Overactive bladder  72 y.o. with refractory overactive bladder who presents for percutaneous tibial nerve stimulation. The patient presents for PTNS session # 6.   Procedure: The patient was placed in the sitting position and the right lower extremity was prepped in the usual fashion. The PTNS needle was then inserted at a 60 degree angle, 5 cm cephalad and 2 cm posterior to the medial malleolus. The PTNS unit was then programmed and an optimal response was noted at 6 milliamps. The PTNS stimulation was then performed at this setting for 30 minutes without incident and the patient tolerated the procedure well. The needle was removed and hemostasis was noted.    Of note we checked a urine as she reports she has had a regression in her symptoms. Urine was noted to be clear with no sign of infection.   The pt will return in 1 week for PTNS session # 7. All questions were answered.

## 2022-12-11 NOTE — Telephone Encounter (Signed)
Dr. Edward Jolly -please review and advise if consult and/or [facility] after MRI Pelvis on 12/15? Patient also reports episode of bloody discharge in urine.

## 2022-12-12 ENCOUNTER — Encounter: Payer: Self-pay | Admitting: Obstetrics and Gynecology

## 2022-12-12 ENCOUNTER — Ambulatory Visit (INDEPENDENT_AMBULATORY_CARE_PROVIDER_SITE_OTHER): Payer: Medicare Other | Admitting: Obstetrics and Gynecology

## 2022-12-12 VITALS — BP 179/62 | HR 96

## 2022-12-12 DIAGNOSIS — R350 Frequency of micturition: Secondary | ICD-10-CM

## 2022-12-12 DIAGNOSIS — N3281 Overactive bladder: Secondary | ICD-10-CM

## 2022-12-12 LAB — POCT URINALYSIS DIPSTICK
Bilirubin, UA: NEGATIVE
Blood, UA: NEGATIVE
Glucose, UA: NEGATIVE
Ketones, UA: NEGATIVE
Leukocytes, UA: NEGATIVE
Nitrite, UA: NEGATIVE
Protein, UA: POSITIVE — AB
Spec Grav, UA: 1.02 (ref 1.010–1.025)
Urobilinogen, UA: 0.2 U/dL
pH, UA: 6 (ref 5.0–8.0)

## 2022-12-12 NOTE — Progress Notes (Signed)
[facility]  PTNS VISIT  CC:  Overactive bladder  72 y.o. with refractory overactive bladder who presents for percutaneous tibial nerve stimulation. The patient presents for PTNS session # 7.  Patient has completed a mid-treatment bladder diary showing urgency approximately 5 times daily which is down from the initial 7 average times daily. She also reports this weekend was a difficult one and may not be indicative of all her progress. She also reports she was having some difficulty with bowel and was having bladder spasms.   Procedure: The patient was placed in the sitting position and the right lower extremity was prepped in the usual fashion. The PTNS needle was then inserted at a 60 degree angle, 5 cm cephalad and 2 cm posterior to the medial malleolus. The PTNS unit was then programmed and an optimal response was noted at 6 milliamps. The PTNS stimulation was then performed at this setting for 30 minutes without incident and the patient tolerated the procedure well. The needle was removed and hemostasis was noted.   Lab Results  Component Value Date   COLORU yellow 12/12/2022   CLARITYU clear 12/12/2022   GLUCOSEUR Negative 12/12/2022   BILIRUBINUR negative 12/12/2022   KETONESU negative 12/12/2022   SPECGRAV 1.020 12/12/2022   RBCUR negative 12/12/2022   PHUR 6.0 12/12/2022   PROTEINUR Positive (A) 12/12/2022   UROBILINOGEN 0.2 12/12/2022   LEUKOCYTESUR Negative 12/12/2022   Urinalysis not concerning for infection at this time. No indication of leukocytes or nitrites in urine.   The pt will return in 1 week for PTNS session # 8. All questions were answered.

## 2022-12-20 ENCOUNTER — Encounter: Payer: Self-pay | Admitting: Obstetrics and Gynecology

## 2022-12-20 ENCOUNTER — Ambulatory Visit (INDEPENDENT_AMBULATORY_CARE_PROVIDER_SITE_OTHER): Payer: Medicare Other | Admitting: Obstetrics and Gynecology

## 2022-12-20 VITALS — BP 182/77 | HR 94

## 2022-12-20 DIAGNOSIS — N3281 Overactive bladder: Secondary | ICD-10-CM

## 2022-12-20 DIAGNOSIS — R350 Frequency of micturition: Secondary | ICD-10-CM

## 2022-12-20 NOTE — Progress Notes (Signed)
[facility]  PTNS VISIT  CC:  Overactive bladder  72 y.o. with refractory overactive bladder who presents for percutaneous tibial nerve stimulation. The patient presents for PTNS session # 8.   Procedure: The patient was placed in the sitting position and the right lower extremity was prepped in the usual fashion. The PTNS needle was then inserted at a 60 degree angle, 5 cm cephalad and 2 cm posterior to the medial malleolus. The PTNS unit was then programmed and an optimal response was noted at 7 milliamps. The PTNS stimulation was then performed at this setting for 30 minutes without incident and the patient tolerated the procedure well. The needle was removed and hemostasis was noted.   The pt will return in 1 week for PTNS session # 9. All questions were answered.

## 2022-12-22 ENCOUNTER — Ambulatory Visit: Payer: Medicare Other | Attending: Cardiology | Admitting: Pharmacist

## 2022-12-22 DIAGNOSIS — E785 Hyperlipidemia, unspecified: Secondary | ICD-10-CM | POA: Insufficient documentation

## 2022-12-22 DIAGNOSIS — I2510 Atherosclerotic heart disease of native coronary artery without angina pectoris: Secondary | ICD-10-CM | POA: Diagnosis not present

## 2022-12-22 NOTE — Assessment & Plan Note (Signed)
Assessment: Patient with very elevated LDL particle number and ApoB Patient feels like medications have caused her to feel poorly and foggy headed Feels like she could not think clearly on rosuvastatin.  This has significantly increased since stopping Muscle pain and cramps are also better Patient patient has a lot of questions about diet and impact on her cholesterol which were answered Discussed the importance of medication on plaque stabilization and potential progression.  Most importantly prevention of further plaque buildup. Patient is agreeable to try Repatha Injection technique and side effects were reviewed with patient and her husband  Plan: Submit prior authorization for Repatha Patient can call [phone] Repatha for co-pay card if approved Repeat labs in 2 to 3 months.  Lab slip given to patient and she can go to a [facility] in [place]

## 2022-12-22 NOTE — Patient Instructions (Signed)
I will submit a prior authorization for Repatha. I will call you once I hear back. Please call me at [phone] with any questions.   Repatha is a cholesterol medication that improved your body's ability to get rid of "bad cholesterol" known as LDL. It can lower your LDL up to 60%! It is an injection that is given under the skin every 2 weeks. The medication often requires a prior authorization from your insurance company. We will take care of submitting all the necessary information to your insurance company to get it approved. The most common side effects of Repatha include runny nose, symptoms of the common cold, rarely flu or flu-like symptoms, back/muscle pain in about 3-4% of the patients, and redness, pain, or bruising at the injection site. Tell your healthcare provider if you have any side effect that bothers you or that does not go away.     

## 2022-12-22 NOTE — Progress Notes (Unsigned)
Patient ID: Kerry [name]                 DOB: October 08, 1950                    MRN: [id number]      HPI: Kerry [name] is a 72 y.o. female patient referred to lipid clinic by Dr. Tenny Craw. PMH is significant for  dyspnea, HTN, CAD   Calcium score CT done Dec 2023   Ca score was 1051   Started on statin/ASA.   Set up for PET CT (01/2022)which had multiple perfusion defects   LHC done in Jan 2024 showed multivessel CAD    She underwent PCI/DES to OM1 and PTCA to LC/OM2.  Platinum stent used due to concern of stent allergy.  Patient presents today to lipid clinic accompanied by her husband.  After a thorough review of her medications it appears patient is not taking anything currently for cholesterol.  She stopped taking rosuvastatin when she was on Paxlovid and did not resume.  Realized that her brain fog was significantly improved off of this.  She also reports that her muscle pains and cramps have improved.  Although she did admit that they have come back but not as bad.  Patient states that she has felt the worst this year even after her stent was placed in January.  She shows me labs from 2016 where her LDL cholesterol and total cholesterol was the best it has been.  She was on a very low carbohydrate diet at that time, however it resulted in issues with uric acid.  We discussed the problems with extremely low carbohydrate diets which sometimes results in patients eating excessive amounts of saturated fat.  We talked about diet effects on LDL cholesterol and how this can be somewhat genetically determined.  But that she should focus on a diet high in fiber and low in saturated fat.  Patient reports that when she was placed on carvedilol it said that she needed to take it with a meal.  She states this disrupted her eating pattern and ended up adding a third full meal (protein vegetable and fruit and sometimes nuts) in the evening.  States she gained about 10 or 15 pounds by doing this and  is now having extremely hard time cutting it out even though she is no longer on carvedilol.  I apologized for this misinformation as although taking this medication with food can help with absorption there is absolutely no reason she needed to add an entire meal to her day to take this medication twice a day.   Reviewed options for lowering LDL cholesterol, including ezetimibe, PCSK-9 inhibitors.   Discussed side effects and potential decreases in LDL cholesterol.  Also reviewed cost information.  Patient has commercial state health plan.  Therefore she should be able to use a co-pay card if she calls the [phone] Repatha number.  Current Medications: None Intolerances: rosuvastatin 20, 40mg  Risk Factors: CAD, age, HTN LDL-C goal: Less than 70 ApoB goal: Less than 80   Family History:  Family History  Problem Relation Age of Onset   Rheumatologic disease Mother    Diabetes Mother    Heart attack Father    Rheumatologic disease Sister    Arthritis Sister        --Rheumatoid arthritis   Ovarian cancer Sister 46   Crohn's disease Brother    Kidney failure Brother    Ulcerative colitis Brother  Glaucoma Brother      Social History:  Social History   Socioeconomic History   Marital status: Married    Spouse name: Not on file   Number of children: Not on file   Years of education: Not on file   Highest education level: Not on file  Occupational History   Occupation: Retired  Tobacco Use   Smoking status: Never   Smokeless tobacco: Never  Vaping Use   Vaping status: Never Used  Substance and Sexual Activity   Alcohol use: No    Alcohol/week: 0.0 standard drinks of alcohol   Drug use: No   Sexual activity: Not Currently    Partners: Male    Birth control/protection: Post-menopausal  Other Topics Concern   Not on file  Social History Narrative   Are you right handed or left handed? Right handed   Are you currently employed ? No retired   What is your current occupation?    Do you live at home alone? No   Who lives with you? Lives with husband.    What type of home do you live in: 1 story or 2 story? Two story home.       Social Determinants of Health   Financial Resource Strain: Not on file  Food Insecurity: No Food Insecurity (01/25/2022)   Hunger Vital Sign    Worried About Running Out of Food in the Last Year: Never true    Ran Out of Food in the Last Year: Never true  Transportation Needs: No Transportation Needs (01/25/2022)   PRAPARE - Administrator, Civil Service (Medical): No    Lack of Transportation (Non-Medical): No  Physical Activity: Not on file  Stress: Not on file  Social Connections: Not on file  Intimate Partner Violence: Not At Risk (01/25/2022)   Humiliation, Afraid, Rape, and Kick questionnaire    Fear of Current or Ex-Partner: No    Emotionally Abused: No    Physically Abused: No    Sexually Abused: No     Labs: Lipid Panel  11/23/22 LDL-P 2200    Component Value Date/Time   CHOL 227 (H) 11/23/2022 1154   TRIG 137 11/23/2022 1154   HDL 47 11/23/2022 1154   CHOLHDL 4.8 (H) 11/23/2022 1154   CHOLHDL 5.6 02/09/2011 1027   VLDL 27 02/09/2011 1027   LDLCALC 155 (H) 11/23/2022 1154   LABVLDL 25 11/23/2022 1154    Past Medical History:  Diagnosis Date   Abnormal uterine bleeding    Allergic rhinitis, cause unspecified    Arthritis of knee, right    Bursitis of left shoulder    Cherry hemangioma 07/28/2015   vulva   Chronic pain    Coronary artery disease    Dyspareunia    Elevated cholesterol    Elevated uric acid in blood 2021   Fibroid    Foot fracture, left 01/2015   and torn tendons   GERD (gastroesophageal reflux disease)    HTN (hypertension)    Interstitial lung disease (HCC)    Kidney stones    Sleep apnea    Urinary incontinence     Current Outpatient Medications on File Prior to Visit  Medication Sig Dispense Refill   amLODipine (NORVASC) 5 MG tablet Take 1 tablet (5 mg total) by  mouth daily. 90 tablet 3   ascorbic acid (VITAMIN C) 250 MG tablet Take 250 mg by mouth daily.     aspirin EC 81 MG tablet Take 1 tablet (81  mg total) by mouth daily. Swallow whole. 100 tablet 3   carboxymethylcellulose (REFRESH PLUS) 0.5 % SOLN 1 drop 2 (two) times daily as needed (for dry eyes).     CINNAMON PO Take 2.5 mLs by mouth daily.     clopidogrel (PLAVIX) 75 MG tablet Take 1 tablet (75 mg total) by mouth daily. 90 tablet 3   Coenzyme Q10 200 MG capsule Take 200 mg by mouth daily.     cyanocobalamin (VITAMIN B12) 1000 MCG tablet Take 1,000 mcg by mouth every Monday, Wednesday, and Friday.     cyanocobalamin (VITAMIN B12) 1000 MCG/ML injection Inject 1,000 mcg into the muscle once. Once a month     metoprolol succinate (TOPROL-XL) 25 MG 24 hr tablet Take 25 mg by mouth daily.     MISC NATURAL PRODUCTS PO Take by mouth. Black currant seed oil     Multiple Vitamins-Minerals (SUPER MULTI-VITAMIN PO) Take 1 capsule by mouth daily. Solgar Formula VM-75     NON FORMULARY Take 3 fluid ounces by mouth daily. Goat Kiefer     Omega-3 Fatty Acids (FISH OIL) 500 MG CAPS Take 500 mg by mouth daily.     Probiotic Product (PROBIOTIC ADVANCED PO) Take by mouth. Patient states taking 5 billion, one capsule daily. Suprema Dophilus probiotic     Sodium Chloride-Xylitol (XLEAR SINUS CARE SPRAY NA) Place into the nose. 2 sprays at bedtime     TART CHERRY PO Take 1 tablet by mouth daily.     VITAMIN D-VITAMIN K PO Take 1 capsule by mouth every other day. Vit d 5000 units and of K     nitroGLYCERIN (NITROSTAT) 0.4 MG SL tablet Place 1 tablet (0.4 mg total) under the tongue every 5 (five) minutes x 3 doses as needed for chest pain. 25 tablet 1   No current facility-administered medications on file prior to visit.    Allergies  Allergen Reactions   Meloxicam Other (See Comments)    Made patient have stomach pain ,sore throat, joint pains, face flush    Molds & Smuts Other (See Comments),  Palpitations, Shortness Of Breath and Swelling   Cefixime Swelling   Celebrex [Celecoxib] Swelling   Cobalt Other (See Comments)    Per allergist   Gabapentin Other (See Comments)    Joint pain   Levofloxacin Other (See Comments)    Muscle and joint pain   Molybdenum Other (See Comments)    Per allergist   Other Other (See Comments)    TEQUIN. TEQUIN - JOINT AND MUSCLE PAIN Other Reaction: painful joints TEQUIN. TEQUIN - JOINT AND MUSCLE PAIN Tantal Metal   Penicillins Hives   Robaxin [Methocarbamol]    Statins Other (See Comments)    Muscle pain   Tomato Hives   Adhesive [Tape] Rash    Sensitive to EKG leads; sensitive to 39M Micropore tape   Dust Mite Extract Itching, Other (See Comments) and Palpitations   Tetracycline Hcl Rash    Can take Doxy   Tetracyclines & Related Rash    Assessment/Plan:  1. Hyperlipidemia -  Dyslipidemia Assessment: Patient with very elevated LDL particle number and ApoB Patient feels like medications have caused her to feel poorly and foggy headed Feels like she could not think clearly on rosuvastatin.  This has significantly increased since stopping Muscle pain and cramps are also better Patient patient has a lot of questions about diet and impact on her cholesterol which were answered Discussed the importance of medication on plaque stabilization  and potential progression.  Most importantly prevention of further plaque buildup. Patient is agreeable to try Repatha Injection technique and side effects were reviewed with patient and her husband  Plan: Submit prior authorization for Repatha Patient can call [phone] Repatha for co-pay card if approved Repeat labs in 2 to 3 months.  Lab slip given to patient and she can go to a [facility] in [place],  Olene Floss, Pharm.D, BCACP, BCPS, CPP [facility] A Division of [facility] [address] [place], [place] [address]  Phone: [phone]; Fax:  [phone]

## 2022-12-23 NOTE — Progress Notes (Unsigned)
Cardiology Office Note:    Date:  12/23/2022   ID:  Kerry [name], DOB November 18, 1950, MRN [id number]  PCP:  Street, [name], MD  Cardiologist:  Dietrich Pates, MD    Pt presents for follow up of CAD   History of Present Illness:    Kerry [name] is a 72 y.o. female with hx of dyspnea, HTN, CAD   Calcium score CT done Dec 2023   Ca score was 1051   Started on statin/ASA.   Set up for PET CT (01/2022)which had multiple perfusion defects   LHC done in Jan 2024 showed multivessel CAD    She underwent PCI/DES to OM1 and PTCA to LC/OM2.  Platinum stent used due to concern of stent allergy She was placed on ASA and Plavix for 6 months  The pt was seen by Inda Coke in later Jan  Still had some SOB     procedure  Unable to go through cardiac rehab due to arthritis.   Machines not the right size   Does hae leg pain with walking         I saw the pt in March 2024  Since seen she says she still gets SOB wit hactivity   She has been tired   Never felt peppy after procedure in Jan 2024.     Not very active   Trying to get to pool Denies CP   I saw the pt in clinic in Summer 2024   She was just seen by L[place]le [place]  Past Medical History:  Diagnosis Date   Abnormal uterine bleeding    Allergic rhinitis, cause unspecified    Arthritis of knee, right    Bursitis of left shoulder    Cherry hemangioma 07/28/2015   vulva   Chronic pain    Coronary artery disease    Dyspareunia    Elevated cholesterol    Elevated uric acid in blood 2021   Fibroid    Foot fracture, left 01/2015   and torn tendons   GERD (gastroesophageal reflux disease)    HTN (hypertension)    Interstitial lung disease (HCC)    Kidney stones    Sleep apnea    Urinary incontinence     Past Surgical History:  Procedure Laterality Date    dilatation and curettage  2000   CARPAL TUNNEL RELEASE  1998   CARPAL TUNNEL RELEASE Left 05/21/2020   CATARACT EXTRACTION Bilateral    CORONARY STENT INTERVENTION N/A  01/26/2022   Procedure: CORONARY STENT INTERVENTION;  Surgeon: Kathleene Hazel, MD;  Location: [facility];  Service: Cardiovascular;  Laterality: N/A;   dental implants  2010   DENTAL SURGERY     DILATATION & CURETTAGE/HYSTEROSCOPY WITH MYOSURE N/A 08/22/2021   Procedure: DILATATION & CURETTAGE/HYSTEROSCOPY WITH MYOSURE RESECTION OF ENDOMETRIAL MASS;  Surgeon: Patton Salles, MD;  Location: [facility];  Service: Gynecology;  Laterality: N/A;   ESOPHAGEAL DILATION  2010   HAMMER TOE SURGERY     rt   LEFT HEART CATH AND CORONARY ANGIOGRAPHY N/A 01/25/2022   Procedure: LEFT HEART CATH AND CORONARY ANGIOGRAPHY;  Surgeon: Lennette Bihari, MD;  Location: [facility];  Service: Cardiovascular;  Laterality: N/A;   OPERATIVE ULTRASOUND N/A 08/22/2021   Procedure: OPERATIVE ULTRASOUND GUIDED HYSTEROSCOPY;  Surgeon: Patton Salles, MD;  Location: [facility];  Service: Gynecology;  Laterality: N/A;   PELVIC LAPAROSCOPY  1990   ROTATOR  CUFF REPAIR Right 09/2011   Dr. Ave Filter   ROTATOR CUFF REPAIR Left 11/11/2013   Dr. Ave Filter    SEPTOPLASTY     UMBILICAL HERNIA REPAIR  2010   Patient reports mesh was used for the repair.    Current Medications: No outpatient medications have been marked as taking for the 12/26/22 encounter (Appointment) with Pricilla Riffle, MD.     Allergies:   Meloxicam, Molds & smuts, Cefixime, Celebrex [celecoxib], Cobalt, Gabapentin, Levofloxacin, Molybdenum, Other, Penicillins, Robaxin [methocarbamol], Statins, Tomato, Adhesive [tape], Dust mite extract, Tetracycline hcl, and Tetracyclines & related   Social History   Socioeconomic History   Marital status: Married    Spouse name: Not on file   Number of children: Not on file   Years of education: Not on file   Highest education level: Not on file  Occupational History   Occupation: Retired  Tobacco Use   Smoking status: Never   Smokeless  tobacco: Never  Vaping Use   Vaping status: Never Used  Substance and Sexual Activity   Alcohol use: No    Alcohol/week: 0.0 standard drinks of alcohol   Drug use: No   Sexual activity: Not Currently    Partners: Male    Birth control/protection: Post-menopausal  Other Topics Concern   Not on file  Social History Narrative   Are you right handed or left handed? Right handed   Are you currently employed ? No retired   What is your current occupation?   Do you live at home alone? No   Who lives with you? Lives with husband.    What type of home do you live in: 1 story or 2 story? Two story home.       Social Determinants of Health   Financial Resource Strain: Not on file  Food Insecurity: No Food Insecurity (01/25/2022)   Hunger Vital Sign    Worried About Running Out of Food in the Last Year: Never true    Ran Out of Food in the Last Year: Never true  Transportation Needs: No Transportation Needs (01/25/2022)   PRAPARE - Administrator, Civil Service (Medical): No    Lack of Transportation (Non-Medical): No  Physical Activity: Not on file  Stress: Not on file  Social Connections: Not on file     Family History: The patient's family history includes Arthritis in her sister; Crohn's disease in her brother; Diabetes in her mother; Glaucoma in her brother; Heart attack in her father; Kidney failure in her brother; Ovarian cancer (age of onset: 91) in her sister; Rheumatologic disease in her mother and sister; Ulcerative colitis in her brother. ROS:   Please see the history of present illness.    All 14 point review of systems negative except as described per history of present illness  EKGs/Labs/Other Studies Reviewed:    EKG   Not done today   Recent Labs: 01/24/2022: B Natriuretic Peptide 137.3 01/27/2022: Hemoglobin 12.6; Platelets 203 11/23/2022: ALT 17; BUN 14; Creatinine, Ser 0.58; Potassium 3.9; Sodium 142  Recent Lipid Panel    Component Value Date/Time    CHOL 227 (H) 11/23/2022 1154   TRIG 137 11/23/2022 1154   HDL 47 11/23/2022 1154   CHOLHDL 4.8 (H) 11/23/2022 1154   CHOLHDL 5.6 02/09/2011 1027   VLDL 27 02/09/2011 1027   LDLCALC 155 (H) 11/23/2022 1154    Physical Exam:    VS:  BP 136/74  P 71  W 230   Sat 97%  Wt Readings from Last 3 Encounters:  11/24/22 234 lb (106.1 kg)  08/09/22 237 lb (107.5 kg)  07/19/22 230 lb (104.3 kg)     GEN: Morbidly obese 72 yo in no acute distress HEENT: Normal NECK: No JVD; No carotid bruit CARDIAC: RRR, no murmurs RESPIRATORY:  Clear to auscultation bilaterally  ABDOMEN:  OBese  MUSCULOSKELETAL:  No LE  edema; No deformity   Stent intervention 01/26/22    Mid Cx lesion is 80% stenosed.   Prox Cx lesion is 20% stenosed.   2nd Diag lesion is 60% stenosed.   3rd Mrg lesion is 90% stenosed.   A drug-eluting stent was successfully placed using a SYNERGY XD 2.25X20.   Balloon angioplasty was performed using a BALLN SAPPHIRE 2.0X12.   Post intervention, there is a 0% residual stenosis.   Post intervention, there is a 60% residual stenosis.   Severe stenosis in the mid Circumflex affecting flow into the first obtuse marginal branch and the second obtuse marginal branch.  Successful PTCA/DES x 1 OM1 (Synergy stent platform as felt to be the most favorable given her metal allergy profile) Successful balloon angioplasty mid Circumflex/OM2   Recommendations: DAPT with ASA and Plavix for at least six months.    L heart cath  Jan 2024      2nd Diag lesion is 60% stenosed.   Mid Cx lesion is 80% stenosed.   3rd Mrg lesion is 90% stenosed.   Prox Cx lesion is 20% stenosed.   Prox RCA lesion is 40% stenosed.   Mid RCA lesion is 10% stenosed.   Dist RCA lesion is 20% stenosed.   RPDA-1 lesion is 30% stenosed.   RPDA-2 lesion is 70% stenosed.   LV end diastolic pressure is severely elevated.   Multivessel CAD with 60% ostial narrowing of the second diagonal branch of the LAD; 20% proximal  circumflex stenosis with mid circumflex bifurcation stenosis of 80 and 90%; dominant RCA with 40% proximal stenosis, mild 10 and 20% mid stenoses, and 30 and 70% mid and distal PDA stenosis.   Serve global LV contractility with EF estimated 55 to 60%.  Elevated LVEDP at 30 mm.   Significant blood pressure elevation for which the patient received hydralazine 10 mg x 2 post procedure.   RECOMMENDATION: Patient most likely will need PCI for her bifurcation circumflex stenosis.  She was very concerned about potential metal allergies.  We reviewed the stents with her and her concern with the molybdenum which is present in both the Synergy and Medtronic stents.  The Synergy stent also has platinum chromium in the Resolute stent platinum iridium in addition to cobalt alloy inner core.  Will discuss with his representatives.  Plan intervention tomorrow once further discussion obtained with patient family and device reps.    Impression     1  CAD  Pt s/p PTCA/Stent and PCI in Jan 2024   Still with dyspnea   I do not think it represents angina    Body habitus and lack of conditioning are more likely causes   2  HTN  BP is a little high on current regimen  She says it is 130s to 150  I would recomm increasing amlodipine to 5 mg daily    3   HL   She has not added Zetia to regimen  I would for tighter cntrol of LDL   Follow up lipids and liver panel in October   4  OSA   Continue CPAP  Encouraged  her to get active in pool  Medication Adjustments/Labs and Tests Ordered: Current medicines are reviewed at length with the patient today.  Concerns regarding medicines are outlined above.  No orders of the defined types were placed in this encounter.  Medication changes: No orders of the defined types were placed in this encounter.   Signed, Georgeanna Lea, MD, [facility] 12/23/2022 11:02 PM    [facility]

## 2022-12-25 ENCOUNTER — Encounter: Payer: Self-pay | Admitting: Family Medicine

## 2022-12-26 ENCOUNTER — Ambulatory Visit: Payer: Medicare Other | Attending: Internal Medicine | Admitting: Internal Medicine

## 2022-12-26 ENCOUNTER — Encounter: Payer: Self-pay | Admitting: Internal Medicine

## 2022-12-26 ENCOUNTER — Other Ambulatory Visit: Payer: Self-pay

## 2022-12-26 ENCOUNTER — Telehealth: Payer: Self-pay | Admitting: Pharmacy Technician

## 2022-12-26 ENCOUNTER — Other Ambulatory Visit (HOSPITAL_COMMUNITY): Payer: Self-pay

## 2022-12-26 VITALS — BP 142/84 | HR 98 | Ht <= 58 in | Wt 240.0 lb

## 2022-12-26 DIAGNOSIS — R109 Unspecified abdominal pain: Secondary | ICD-10-CM

## 2022-12-26 DIAGNOSIS — I10 Essential (primary) hypertension: Secondary | ICD-10-CM

## 2022-12-26 DIAGNOSIS — R0601 Orthopnea: Secondary | ICD-10-CM | POA: Diagnosis not present

## 2022-12-26 DIAGNOSIS — R0600 Dyspnea, unspecified: Secondary | ICD-10-CM

## 2022-12-26 DIAGNOSIS — G4733 Obstructive sleep apnea (adult) (pediatric): Secondary | ICD-10-CM

## 2022-12-26 DIAGNOSIS — R0609 Other forms of dyspnea: Secondary | ICD-10-CM | POA: Diagnosis not present

## 2022-12-26 DIAGNOSIS — H04123 Dry eye syndrome of bilateral lacrimal glands: Secondary | ICD-10-CM | POA: Diagnosis not present

## 2022-12-26 DIAGNOSIS — H35373 Puckering of macula, bilateral: Secondary | ICD-10-CM | POA: Diagnosis not present

## 2022-12-26 DIAGNOSIS — H532 Diplopia: Secondary | ICD-10-CM | POA: Diagnosis not present

## 2022-12-26 MED ORDER — POTASSIUM CHLORIDE CRYS ER 20 MEQ PO TBCR: 20.0000 meq | EXTENDED_RELEASE_TABLET | ORAL | 3 refills | Status: DC

## 2022-12-26 MED ORDER — FUROSEMIDE 40 MG PO TABS: 40.0000 mg | ORAL_TABLET | ORAL | 3 refills | Status: DC

## 2022-12-26 NOTE — Telephone Encounter (Signed)
Pharmacy Patient Advocate Encounter   Received notification from  [facility] charts  that prior authorization for repatha is required/requested.   Insurance verification completed.   The patient is insured through [facility] .   Per test claim: PA required; PA submitted to above mentioned insurance via CoverMyMeds Key/confirmation #/EOC [id number] Status is pending

## 2022-12-26 NOTE — Telephone Encounter (Signed)
-----   Message from Olene Floss sent at 12/22/2022  4:26 PM EST ----- Please do prior authorization for Repatha. I25.10 Status post stent Intolerant to rosuvastatin 20 and 40 mg

## 2022-12-26 NOTE — Patient Instructions (Addendum)
Medication Instructions:  LASIX 40 EVERY OTHER DAY  KCL 20 MEQ EVERY OTHER DAY WITH YOUR LASIX  *If you need a refill on your cardiac medications before your next appointment, please call your pharmacy*   Lab Work: BMET AND PRO BNP ON TUESDAY 01/02/23 AT ANY [facility] OR THE FIRST FLOOR OF OUR OFFICE If you have labs (blood work) drawn today and your tests are completely normal, you will receive your results only by: MyChart Message (if you have MyChart) OR A paper copy in the mail If you have any lab test that is abnormal or we need to change your treatment, we will call you to review the results.   Testing/Procedures:    Follow-Up: At [facility], you and your health needs are our priority.  As part of our continuing mission to provide you with exceptional heart care, we have created designated Provider Care Teams.  These Care Teams include your primary Cardiologist (physician) and Advanced Practice Providers (APPs -  Physician Assistants and Nurse Practitioners) who all work together to provide you with the care you need, when you need it.  We recommend signing up for the patient portal called "MyChart".  Sign up information is provided on this After Visit Summary.  MyChart is used to connect with patients for Virtual Visits (Telemedicine).  Patients are able to view lab/test results, encounter notes, upcoming appointments, etc.  Non-urgent messages can be sent to your provider as well.   To learn more about what you can do with MyChart, go to [email or web address].    Your next appointment:  4 MONTHS

## 2022-12-27 ENCOUNTER — Other Ambulatory Visit (HOSPITAL_COMMUNITY): Payer: Self-pay

## 2022-12-27 ENCOUNTER — Telehealth: Payer: Self-pay | Admitting: Family Medicine

## 2022-12-27 DIAGNOSIS — R109 Unspecified abdominal pain: Secondary | ICD-10-CM

## 2022-12-27 DIAGNOSIS — R1900 Intra-abdominal and pelvic swelling, mass and lump, unspecified site: Secondary | ICD-10-CM

## 2022-12-27 NOTE — Telephone Encounter (Signed)
Pharmacy Patient Advocate Encounter  Received notification from [facility] that Prior Authorization for repatha has been APPROVED from 12/26/22 to 12/25/23. Ran test claim, Copay is $47.00- one month. This test claim was processed through [facility]- copay amounts may vary at other pharmacies due to pharmacy/plan contracts, or as the patient moves through the different stages of their insurance plan.   PA #/Case ID/Reference #: [id number]

## 2022-12-27 NOTE — Telephone Encounter (Signed)
[place] at [employer] called (ext [phone]). Pelvic MRI is scheduled for 12/15 BUT the abdominal MRI needs to be changed to with and without before they can schedule.  Veronika spoke with [place] and expressed concerns about additional contrast but since these are two separate studies that cannot be scheduled at the same time this is unavoidable. Meghanne did not want to move the pelvic from 12/15 but they cannot add the abdominal to that date as they are full.  We may need to help explain this as [place] did not feel like patient was understanding the scheduling/contrast issue.

## 2022-12-27 NOTE — Telephone Encounter (Signed)
I re ordered abdomen with and without

## 2022-12-28 ENCOUNTER — Ambulatory Visit: Payer: Medicare Other | Admitting: Obstetrics and Gynecology

## 2022-12-28 ENCOUNTER — Encounter: Payer: Self-pay | Admitting: Obstetrics and Gynecology

## 2022-12-28 VITALS — BP 174/70 | HR 86

## 2022-12-28 DIAGNOSIS — N3281 Overactive bladder: Secondary | ICD-10-CM | POA: Diagnosis not present

## 2022-12-28 DIAGNOSIS — R350 Frequency of micturition: Secondary | ICD-10-CM

## 2022-12-28 MED ORDER — REPATHA SURECLICK 140 MG/ML ~~LOC~~ SOAJ: 1.0000 mL | SUBCUTANEOUS | 11 refills | Status: DC

## 2022-12-28 NOTE — Addendum Note (Signed)
Addended by: Malena Peer D on: 12/28/2022 08:38 AM   Modules accepted: Orders

## 2022-12-28 NOTE — Telephone Encounter (Signed)
Patient made aware of approval. Given the [phone] # to all [employer] for copay card.

## 2022-12-28 NOTE — Progress Notes (Signed)
[facility]  PTNS VISIT  CC:  Overactive bladder  72 y.o. with refractory overactive bladder who presents for percutaneous tibial nerve stimulation. The patient presents for PTNS session # 9.   Procedure: The patient was placed in the sitting position and the left lower extremity was prepped in the usual fashion. The PTNS needle was then inserted at a 60 degree angle, 5 cm cephalad and 2 cm posterior to the medial malleolus. The PTNS unit was then programmed and an optimal response was noted at 7 milliamps. The PTNS stimulation was then performed at this setting for 30 minutes without incident and the patient tolerated the procedure well. The needle was removed and hemostasis was noted.   The pt will return in 1 week for PTNS session # 10. All questions were answered.

## 2022-12-31 ENCOUNTER — Ambulatory Visit
Admission: RE | Admit: 2022-12-31 | Discharge: 2022-12-31 | Disposition: A | Discharge status: Home / Self Care | Payer: Medicare Other | Source: Ambulatory Visit | Attending: Family Medicine | Admitting: Family Medicine

## 2022-12-31 DIAGNOSIS — N888 Other specified noninflammatory disorders of cervix uteri: Secondary | ICD-10-CM | POA: Diagnosis not present

## 2022-12-31 DIAGNOSIS — R1900 Intra-abdominal and pelvic swelling, mass and lump, unspecified site: Secondary | ICD-10-CM

## 2022-12-31 DIAGNOSIS — D252 Subserosal leiomyoma of uterus: Secondary | ICD-10-CM | POA: Diagnosis not present

## 2022-12-31 MED ORDER — GADOPICLENOL 0.5 MMOL/ML IV SOLN
10.0000 mL | Freq: Once | INTRAVENOUS | Status: AC | PRN
  Administered 2022-12-31: 10 mL via INTRAVENOUS

## 2023-01-02 ENCOUNTER — Other Ambulatory Visit: Payer: Self-pay | Admitting: Pharmacist

## 2023-01-02 DIAGNOSIS — E785 Hyperlipidemia, unspecified: Secondary | ICD-10-CM | POA: Diagnosis not present

## 2023-01-02 DIAGNOSIS — I2510 Atherosclerotic heart disease of native coronary artery without angina pectoris: Secondary | ICD-10-CM | POA: Diagnosis not present

## 2023-01-03 ENCOUNTER — Other Ambulatory Visit: Payer: Self-pay | Admitting: Internal Medicine

## 2023-01-03 DIAGNOSIS — R0600 Dyspnea, unspecified: Secondary | ICD-10-CM | POA: Diagnosis not present

## 2023-01-03 LAB — LIPID PANEL
Chol/HDL Ratio: 5.4 {ratio} — ABNORMAL HIGH (ref 0.0–4.4)
Cholesterol, Total: 244 mg/dL — ABNORMAL HIGH (ref 100–199)
HDL: 45 mg/dL (ref >39)
LDL Chol Calc (NIH): 149 mg/dL — ABNORMAL HIGH (ref 0–99)
Triglycerides: 275 mg/dL — ABNORMAL HIGH (ref 0–149)
VLDL Cholesterol Cal: 50 mg/dL — ABNORMAL HIGH (ref 5–40)

## 2023-01-03 LAB — APOLIPOPROTEIN B: Apolipoprotein B: 143 mg/dL — ABNORMAL HIGH (ref <90)

## 2023-01-04 ENCOUNTER — Ambulatory Visit: Payer: Medicare Other | Admitting: Obstetrics and Gynecology

## 2023-01-04 ENCOUNTER — Encounter: Payer: Self-pay | Admitting: Obstetrics and Gynecology

## 2023-01-04 ENCOUNTER — Other Ambulatory Visit: Payer: Self-pay | Admitting: Pharmacist

## 2023-01-04 VITALS — BP 167/93 | HR 73

## 2023-01-04 DIAGNOSIS — E785 Hyperlipidemia, unspecified: Secondary | ICD-10-CM

## 2023-01-04 DIAGNOSIS — N3281 Overactive bladder: Secondary | ICD-10-CM | POA: Diagnosis not present

## 2023-01-04 DIAGNOSIS — I2510 Atherosclerotic heart disease of native coronary artery without angina pectoris: Secondary | ICD-10-CM

## 2023-01-04 DIAGNOSIS — R350 Frequency of micturition: Secondary | ICD-10-CM

## 2023-01-04 LAB — BASIC METABOLIC PANEL
BUN/Creatinine Ratio: 20 (ref 12–28)
BUN: 16 mg/dL (ref 8–27)
CO2: 24 mmol/L (ref 20–29)
Calcium: 9.2 mg/dL (ref 8.7–10.3)
Chloride: 101 mmol/L (ref 96–106)
Creatinine, Ser: 0.81 mg/dL (ref 0.57–1.00)
Glucose: 108 mg/dL — ABNORMAL HIGH (ref 70–99)
Potassium: 4.1 mmol/L (ref 3.5–5.2)
Sodium: 139 mmol/L (ref 134–144)
eGFR: 76 mL/min/{1.73_m2} (ref >59)

## 2023-01-04 LAB — PRO B NATRIURETIC PEPTIDE: NT-Pro BNP: 160 pg/mL (ref 0–301)

## 2023-01-04 NOTE — Progress Notes (Signed)
[place]  PTNS VISIT  CC:  Overactive bladder  72 y.o. with refractory overactive bladder who presents for percutaneous tibial nerve stimulation. The patient presents for PTNS session # 10.   Procedure: The patient was placed in the sitting position and the right lower extremity was prepped in the usual fashion. The PTNS needle was then inserted at a 60 degree angle, 5 cm cephalad and 2 cm posterior to the medial malleolus. The PTNS unit was then programmed and an optimal response was noted at 9 milliamps. The PTNS stimulation was then performed at this setting for 30 minutes without incident and the patient tolerated the procedure well. The needle was removed and hemostasis was noted.   The pt will return in 1 week for PTNS session # 11. All questions were answered.

## 2023-01-05 NOTE — Progress Notes (Signed)
[name] [address] [place] [address] Phone: [phone] Subjective:   Kerry [name], am serving as a scribe for Dr. Antoine Primas.  I'm seeing this patient by the request  of:  Street, Kerry Coup, MD  CC: Multiple joint pain  [id number]  [place] Kerry [name] is a 72 y.o. female coming in with complaint of back and neck pain. OMT on 11/24/2022. Patient states that her pain has been up and down since last visit. Doing posterior tib nerve stimulation for bladder incontinence which has been helpful.   Had some SOB 2 weeks ago and was placed on Lasix and potassium. Also changed statin. Placed on repatha.   Patient is getting cramps in L thigh and R calf. Notes sore hip with hip flexion. Cramping happening throughout the day at a fairly low frequency. Patient is guarding movements due to not wanting to cramp.   Would like to find a rollator with a lower seat. Would like Rx.   Wants to know if she should still take tart cherry extract supplements with her other medications. Also wants to know if she should take CoQ10 if she is no longer taking a statin.   Would like to discuss if she should do both MRIs for abdomen.           Reviewed prior external information including notes and imaging from previsou exam, outside providers and external EMR if available.   As well as notes that were available from care everywhere and other healthcare systems.  Past medical history, social, surgical and family history all reviewed in electronic medical record.  No pertanent information unless stated regarding to the chief complaint.   Past Medical History:  Diagnosis Date   Abnormal uterine bleeding    Allergic rhinitis, cause unspecified    Arthritis of knee, right    Bursitis of left shoulder    Cherry hemangioma 07/28/2015   vulva   Chronic pain    Coronary artery disease    Dyspareunia    Elevated cholesterol    Elevated uric acid  in blood 2021   Fibroid    Foot fracture, left 01/2015   and torn tendons   GERD (gastroesophageal reflux disease)    HTN (hypertension)    Interstitial lung disease (HCC)    Kidney stones    Sleep apnea    Urinary incontinence     Allergies  Allergen Reactions   Meloxicam Other (See Comments)    Made patient have stomach pain ,sore throat, joint pains, face flush    Molds & Smuts Other (See Comments), Palpitations, Shortness Of Breath and Swelling   Cefixime Swelling   Celebrex [Celecoxib] Swelling   Cobalt Other (See Comments)    Per allergist   Gabapentin Other (See Comments)    Joint pain   Levofloxacin Other (See Comments)    Muscle and joint pain   Molybdenum Other (See Comments)    Per allergist   Other Other (See Comments)    TEQUIN. TEQUIN - JOINT AND MUSCLE PAIN Other Reaction: painful joints TEQUIN. TEQUIN - JOINT AND MUSCLE PAIN Tantal Metal   Penicillins Hives   Robaxin [Methocarbamol]    Statins Other (See Comments)    Muscle pain   Tomato Hives   Adhesive [Tape] Rash    Sensitive to EKG leads; sensitive to 41M Micropore tape   Dust Mite Extract Itching, Other (See Comments) and Palpitations   Tetracycline Hcl Rash    Can take  Doxy   Tetracyclines & Related Rash     Review of Systems:  No headache, visual changes, nausea, vomiting, diarrhea, constipation, dizziness, abdominal pain, skin rash, fevers, chills, night sweats, weight loss, swollen lymph nodes, body aches, joint swelling, chest pain, shortness of breath, mood changes. POSITIVE muscle aches  Objective  Blood pressure 138/82, pulse 93, height 4\' 10"  (1.473 m), weight 235 lb (106.6 kg), last menstrual period 08/16/1992, SpO2 99%.   General: No apparent distress alert and oriented x3 mood and affect normal, dressed appropriately.  HEENT: Pupils equal, extraocular movements intact  Respiratory: Patient's speak in full sentences and does not appear short of breath  Cardiovascular: No lower  extremity edema, non tender, no erythema  Gait severely antalgic.  Using the aid of a rollator MSK:  Back does have significant loss of lordosis.  Tightness with getting out of a chair.  Patient has significant tightness of the paraspinal musculature as well.  Osteopathic findings  C2 flexed rotated and side bent right T3 extended rotated and side bent right inhaled rib T9 extended rotated and side bent left L2 flexed rotated and side bent right Sacrum right on right       Assessment and Plan:  Arthralgia of multiple joints Continues to have arthritis of multiple joints.  Different medications at this time.  We did discuss that the supplement to patient's could be facial.  Patient is continuing to work on her other comorbidities with other physicians.  We did discuss the possibility of repeating CT scan in 2 months to further evaluate the interstitial lung disease that is a potential.  Patient has followed up with cardiology for some of the shortness of breath and the CAD.  Laboratory workup seems to be doing well.  Will continue to monitor with patient being on Repatha as well as Lasix recently.  Follow-up with me again in 6 to 8 weeks otherwise.  Total time reviewing outside records, discussing with patient today 31 minutes does not include  osteopathic manipulation    Nonallopathic problems  Decision today to treat with OMT was based on Physical Exam  After verbal consent patient was treated with  ME, FPR techniques in cervical, rib, thoracic, lumbar, and sacral  areas focused mostly on FPR techniques.  Patient tolerated the procedure well with improvement in symptoms  Patient given exercises, stretches and lifestyle modifications  See medications in patient instructions if given  Patient will follow up in 4-8 weeks    The above documentation has been reviewed and is accurate and complete Judi Saa, DO          Note: This dictation was prepared with Dragon  dictation along with smaller phrase technology. Any transcriptional errors that result from this process are unintentional.

## 2023-01-11 ENCOUNTER — Ambulatory Visit: Payer: Medicare Other | Admitting: Obstetrics and Gynecology

## 2023-01-12 ENCOUNTER — Encounter: Payer: Self-pay | Admitting: Family Medicine

## 2023-01-12 ENCOUNTER — Ambulatory Visit (INDEPENDENT_AMBULATORY_CARE_PROVIDER_SITE_OTHER): Payer: Medicare Other | Admitting: Family Medicine

## 2023-01-12 VITALS — BP 138/82 | HR 93 | Ht <= 58 in | Wt 235.0 lb

## 2023-01-12 DIAGNOSIS — M9904 Segmental and somatic dysfunction of sacral region: Secondary | ICD-10-CM

## 2023-01-12 DIAGNOSIS — M9908 Segmental and somatic dysfunction of rib cage: Secondary | ICD-10-CM

## 2023-01-12 DIAGNOSIS — M9902 Segmental and somatic dysfunction of thoracic region: Secondary | ICD-10-CM | POA: Diagnosis not present

## 2023-01-12 DIAGNOSIS — M2550 Pain in unspecified joint: Secondary | ICD-10-CM | POA: Diagnosis not present

## 2023-01-12 DIAGNOSIS — M9903 Segmental and somatic dysfunction of lumbar region: Secondary | ICD-10-CM | POA: Diagnosis not present

## 2023-01-12 DIAGNOSIS — M9901 Segmental and somatic dysfunction of cervical region: Secondary | ICD-10-CM

## 2023-01-12 MED ORDER — AMBULATORY NON FORMULARY MEDICATION: 1.0000 [IU] | Freq: Once | 0 refills | Status: AC

## 2023-01-12 NOTE — Patient Instructions (Addendum)
Rollator rx: [employer], [facility], [facility] Continue Vit supplements  See me again in 6-7 weeks

## 2023-01-12 NOTE — Assessment & Plan Note (Addendum)
Continues to have arthritis of multiple joints.  Different medications at this time.  We did discuss that the supplement to patient's could be facial.  Patient is continuing to work on her other comorbidities with other physicians.  We did discuss the possibility of repeating CT scan in 2 months to further evaluate the interstitial lung disease that is a potential.  Patient has followed up with cardiology for some of the shortness of breath and the CAD.  Laboratory workup seems to be doing well.  Will continue to monitor with patient being on Repatha as well as Lasix recently.  Follow-up with me again in 6 to 8 weeks otherwise.  Total time reviewing outside records, discussing with patient today 31 minutes does not include  osteopathic manipulation

## 2023-01-15 ENCOUNTER — Ambulatory Visit (INDEPENDENT_AMBULATORY_CARE_PROVIDER_SITE_OTHER): Payer: Medicare Other | Admitting: Obstetrics and Gynecology

## 2023-01-15 ENCOUNTER — Encounter: Payer: Self-pay | Admitting: Obstetrics and Gynecology

## 2023-01-15 VITALS — BP 159/79 | HR 80

## 2023-01-15 DIAGNOSIS — R350 Frequency of micturition: Secondary | ICD-10-CM

## 2023-01-15 DIAGNOSIS — N3281 Overactive bladder: Secondary | ICD-10-CM

## 2023-01-15 NOTE — Progress Notes (Signed)
[facility]  PTNS VISIT  CC:  Overactive bladder  72 y.o. with refractory overactive bladder who presents for percutaneous tibial nerve stimulation. The patient presents for PTNS session # 11.   Procedure: The patient was placed in the sitting position and the left lower extremity was prepped in the usual fashion. The PTNS needle was then inserted at a 60 degree angle, 5 cm cephalad and 2 cm posterior to the medial malleolus. The PTNS unit was then programmed and an optimal response was noted at 9 milliamps. The PTNS stimulation was then performed at this setting for 30 minutes without incident and the patient tolerated the procedure well. The needle was removed and hemostasis was noted.   The pt will return in 1 week for PTNS session # 12. All questions were answered.

## 2023-01-18 ENCOUNTER — Ambulatory Visit: Payer: Medicare Other | Admitting: Obstetrics and Gynecology

## 2023-01-22 ENCOUNTER — Encounter: Payer: Self-pay | Admitting: Pharmacist

## 2023-01-22 NOTE — Telephone Encounter (Signed)
 OK to go just to Aspirin alone   It has been 1 year since intervention

## 2023-01-23 ENCOUNTER — Ambulatory Visit (INDEPENDENT_AMBULATORY_CARE_PROVIDER_SITE_OTHER): Payer: Medicare Other | Admitting: Obstetrics and Gynecology

## 2023-01-23 ENCOUNTER — Ambulatory Visit: Payer: Medicare Other | Admitting: Adult Health

## 2023-01-23 ENCOUNTER — Encounter: Payer: Self-pay | Admitting: Obstetrics and Gynecology

## 2023-01-23 VITALS — BP 191/80 | HR 93

## 2023-01-23 DIAGNOSIS — R350 Frequency of micturition: Secondary | ICD-10-CM

## 2023-01-23 DIAGNOSIS — N3281 Overactive bladder: Secondary | ICD-10-CM

## 2023-01-23 DIAGNOSIS — G44219 Episodic tension-type headache, not intractable: Secondary | ICD-10-CM

## 2023-01-23 NOTE — Progress Notes (Signed)
 [facility]  PTNS VISIT  CC:  Overactive bladder  73 y.o. with refractory overactive bladder who presents for percutaneous tibial nerve stimulation. The patient presents for PTNS session # 12.   Of note patient reports that on January 1 she began experiencing symptoms of a throbbing sensation in the right temporal region.  She states that she had a small ulcer in her mouth that seemed irritated and anytime she touched the ulcer she will get the throbbing sensation in her head and then she also had associated pain with movement of one of her arms.  I did a brief neurological checks she was able to raise her eyebrows evenly, smile and even urine, move tongue from side-to-side, do nose to fingertip touches with no issues and had no slurred speech or change in her gait.   Procedure: The patient was placed in the sitting position and the right lower extremity was prepped in the usual fashion. The PTNS needle was then inserted at a 60 degree angle, 5 cm cephalad and 2 cm posterior to the medial malleolus. The PTNS unit was then programmed and an optimal response was noted at 9 milliamps. The PTNS stimulation was then performed at this setting for 30 minutes without incident and the patient tolerated the procedure well. The needle was removed and hemostasis was noted.   The pt will return in 1 month for PTNS maintenance session # 1.   I did encourage patient that if she has severe headaches, change in speech, change in gait that she should consider this a stroke until proven otherwise and should seek immediate medical attention. She reports understanding.    All questions were answered.

## 2023-01-23 NOTE — Progress Notes (Unsigned)
GYNECOLOGY  VISIT   HPI: 73 y.o.   Married  Caucasian female   G2P2002 with Patient's last menstrual period was 08/16/1992.   here for: SHGM and endo bx.  Having recurrent postmenopausal bleeding every 4 weeks.     Pelvic US 11/22/22 showed EMS 9.2 mm.  EMB on 11/22/22 showed scant atrophic endometrium.  No atypia.   FH ovarian cancer in patient's mother.  Patient just stopped Plavix.    May travel in about 1 month.   GYNECOLOGIC HISTORY: Patient's last menstrual period was 08/16/1992. Contraception:  PMP Menopausal hormone therapy:  n/a Last 2 paps:  11/22/22 neg: HR HPV neg, 09/15/20 neg: HR HPV neg History of abnormal Pap or positive HPV:  no Mammogram:  10/18/22 Breast Density Cat C, BI-RADS CAT 1 neg        OB History     Gravida  2   Para  2   Term  2   Preterm      AB      Living  2      SAB      IAB      Ectopic      Multiple      Live Births  2              Patient Active Problem List   Diagnosis Date Noted   Postmenopausal bleeding 11/24/2022   Abnormal cardiovascular stress test 01/27/2022   CAD (coronary artery disease) 01/26/2022   CAD, multiple vessel 01/24/2022   Dizziness after extension of neck 11/29/2021   Degenerative cervical disc 04/10/2019   Carpal tunnel syndrome on left 02/27/2019   Left lumbar radiculopathy 12/28/2017   Nonallopathic lesion of thoracic region 08/02/2017   Swelling of both lower extremities 01/18/2017   Nonallopathic lesion of sacral region 01/03/2017   Allergic contact dermatitis due to metals 12/16/2016   Abnormal gait 04/10/2016   Dyslipidemia 08/12/2015   Dyspnea on exertion 08/12/2015   Palpitations 08/12/2015   Pes anserine bursitis 08/10/2015   Rupture of left posterior tibialis tendon 03/25/2015   OSA (obstructive sleep apnea) 03/05/2015   Navicular fracture of ankle 01/21/2015   Sinus infection 12/25/2014   Uric acid arthropathy 03/23/2014   High plasma oxalate 02/17/2014    Nonallopathic lesion of cervical region 12/19/2012   Fibroids 05/28/2012   Family history of ovarian cancer 05/28/2012   Strain of quadriceps muscle 04/17/2012   Nonallopathic lesion of costovertebral region 01/23/2012   Left shoulder pain 12/20/2011   Rotator cuff disorder 09/26/2011   Bilateral foot pain 09/20/2011   Arthralgia of multiple joints 07/26/2011   Arthritis of knee, degenerative 07/26/2011   Arthritis of hand, degenerative 07/26/2011   Low back pain 07/26/2011   Vitamin B 12 deficiency 03/05/2011   TMJ (temporomandibular joint syndrome) 03/05/2011   Pes planus 03/01/2011   Right knee pain 03/01/2011   Greater trochanteric bursitis of left hip 03/01/2011   Nonallopathic lesion of lumbosacral region 01/27/2011   Obesity 12/29/2010   Fatigue 12/29/2010   Allergic rhinitis    Chronic pain    GERD (gastroesophageal reflux disease)    Essential hypertension    Sleep apnea     Past Medical History:  Diagnosis Date   Abnormal uterine bleeding    Allergic rhinitis, cause unspecified    Arthritis of knee, right    Bursitis of left shoulder    Cherry hemangioma 07/28/2015   vulva   Chronic pain    Coronary artery disease  Dyspareunia    Elevated cholesterol    Elevated uric acid in blood 2021   Fibroid    Foot fracture, left 01/2015   and torn tendons   GERD (gastroesophageal reflux disease)    HTN (hypertension)    Interstitial lung disease (HCC)    Kidney stones    Sleep apnea    Urinary incontinence     Past Surgical History:  Procedure Laterality Date    dilatation and curettage  2000   CARPAL TUNNEL RELEASE  1998   CARPAL TUNNEL RELEASE Left 05/21/2020   CATARACT EXTRACTION Bilateral    CORONARY STENT INTERVENTION N/A 01/26/2022   Procedure: CORONARY STENT INTERVENTION;  Surgeon: Kathleene Hazel, MD;  Location: [facility];  Service: Cardiovascular;  Laterality: N/A;   dental implants  2010   DENTAL SURGERY     DILATATION &  CURETTAGE/HYSTEROSCOPY WITH MYOSURE N/A 08/22/2021   Procedure: DILATATION & CURETTAGE/HYSTEROSCOPY WITH MYOSURE RESECTION OF ENDOMETRIAL MASS;  Surgeon: Patton Salles, MD;  Location: [facility];  Service: Gynecology;  Laterality: N/A;   ESOPHAGEAL DILATION  2010   HAMMER TOE SURGERY     rt   LEFT HEART CATH AND CORONARY ANGIOGRAPHY N/A 01/25/2022   Procedure: LEFT HEART CATH AND CORONARY ANGIOGRAPHY;  Surgeon: Lennette Bihari, MD;  Location: [facility];  Service: Cardiovascular;  Laterality: N/A;   OPERATIVE ULTRASOUND N/A 08/22/2021   Procedure: OPERATIVE ULTRASOUND GUIDED HYSTEROSCOPY;  Surgeon: Patton Salles, MD;  Location: [facility];  Service: Gynecology;  Laterality: N/A;   PELVIC LAPAROSCOPY  1990   ROTATOR CUFF REPAIR Right 09/2011   Dr. Ave Filter   ROTATOR CUFF REPAIR Left 11/11/2013   Dr. Ave Filter    SEPTOPLASTY     UMBILICAL HERNIA REPAIR  2010   Patient reports mesh was used for the repair.    Current Outpatient Medications  Medication Sig Dispense Refill   amLODipine (NORVASC) 5 MG tablet Take 1 tablet (5 mg total) by mouth daily. 90 tablet 3   ascorbic acid (VITAMIN C) 250 MG tablet Take 250 mg by mouth daily.     aspirin EC 81 MG tablet Take 1 tablet (81 mg total) by mouth daily. Swallow whole. 100 tablet 3   carboxymethylcellulose (REFRESH PLUS) 0.5 % SOLN 1 drop 2 (two) times daily as needed (for dry eyes).     CINNAMON PO Take 2.5 mLs by mouth daily.     Coenzyme Q10 200 MG capsule Take 200 mg by mouth daily.     cyanocobalamin (VITAMIN B12) 1000 MCG tablet Take 1,000 mcg by mouth every Monday, Wednesday, and Friday.     cyanocobalamin (VITAMIN B12) 1000 MCG/ML injection Inject 1,000 mcg into the muscle once. Once a month     Evolocumab (REPATHA SURECLICK) 140 MG/ML SOAJ Inject 140 mg into the skin every 14 (fourteen) days. 2 mL 11   furosemide (LASIX) 40 MG tablet Take 1 tablet (40 mg total) by mouth every  other day. 90 tablet 3   metoprolol succinate (TOPROL-XL) 25 MG 24 hr tablet Take 25 mg by mouth daily.     MISC NATURAL PRODUCTS PO Take by mouth. Black currant seed oil     Multiple Vitamins-Minerals (SUPER MULTI-VITAMIN PO) Take 1 capsule by mouth daily. Solgar Formula VM-75     nitroGLYCERIN (NITROSTAT) 0.4 MG SL tablet Place 1 tablet (0.4 mg total) under the tongue every 5 (five) minutes x 3 doses as needed  for chest pain. 25 tablet 1   NON FORMULARY Take 3 fluid ounces by mouth daily. Goat Kiefer     Omega-3 Fatty Acids (FISH OIL) 500 MG CAPS Take 500 mg by mouth daily.     potassium chloride SA (KLOR-CON M) 20 MEQ tablet Take 1 tablet (20 mEq total) by mouth every other day. 90 tablet 3   Probiotic Product (PROBIOTIC ADVANCED PO) Take by mouth. Patient states taking 5 billion, one capsule daily. Suprema Dophilus probiotic     Sodium Chloride-Xylitol (XLEAR SINUS CARE SPRAY NA) Place into the nose. 2 sprays at bedtime     TART CHERRY PO Take 1 tablet by mouth daily.     VITAMIN D-VITAMIN K PO Take 1 capsule by mouth every other day. Vit d 5000 units and of K     No current facility-administered medications for this visit.     ALLERGIES: Meloxicam, Molds & smuts, Cefixime, Celebrex [celecoxib], Cobalt, Gabapentin, Levofloxacin, Molybdenum, Other, Penicillins, Robaxin [methocarbamol], Statins, Tomato, Adhesive [tape], Dust mite extract, Tetracycline hcl, and Tetracyclines & related  Family History  Problem Relation Age of Onset   Rheumatologic disease Mother    Diabetes Mother    Heart attack Father    Rheumatologic disease Sister    Arthritis Sister        --Rheumatoid arthritis   Ovarian cancer Sister 67   Crohn's disease Brother    Kidney failure Brother    Ulcerative colitis Brother    Glaucoma Brother     Social History   Socioeconomic History   Marital status: Married    Spouse name: Not on file   Number of children: Not on file   Years of education: Not on  file   Highest education level: Not on file  Occupational History   Occupation: Retired  Tobacco Use   Smoking status: Never   Smokeless tobacco: Never  Vaping Use   Vaping status: Never Used  Substance and Sexual Activity   Alcohol use: No    Alcohol/week: 0.0 standard drinks of alcohol   Drug use: No   Sexual activity: Not Currently    Partners: Male    Birth control/protection: Post-menopausal  Other Topics Concern   Not on file  Social History Narrative   Are you right handed or left handed? Right handed   Are you currently employed ? No retired   What is your current occupation?   Do you live at home alone? No   Who lives with you? Lives with husband.    What type of home do you live in: 1 story or 2 story? Two story home.       Social Drivers of Corporate investment banker Strain: Not on file  Food Insecurity: No Food Insecurity (01/25/2022)   Hunger Vital Sign    Worried About Running Out of Food in the Last Year: Never true    Ran Out of Food in the Last Year: Never true  Transportation Needs: No Transportation Needs (01/25/2022)   PRAPARE - Administrator, Civil Service (Medical): No    Lack of Transportation (Non-Medical): No  Physical Activity: Not on file  Stress: Not on file  Social Connections: Not on file  Intimate Partner Violence: Not At Risk (01/25/2022)   Humiliation, Afraid, Rape, and Kick questionnaire    Fear of Current or Ex-Partner: No    Emotionally Abused: No    Physically Abused: No    Sexually Abused: No  Review of Systems  See HPI.   PHYSICAL EXAMINATION:   BP 136/74 (BP Location: Left Arm, Patient Position: Sitting, Cuff Size: Large)   Pulse 93   Wt 235 lb (106.6 kg)   LMP 08/16/1992   SpO2 97%   BMI 49.12 kg/m     General appearance: alert, cooperative and appears stated age  Pelvic: External genitalia:  no lesions              Urethra:  normal appearing urethra with no masses, tenderness or lesions               Bartholins and Skenes: normal                 Vagina: normal appearing vagina with normal color and discharge, no lesions              Cervix: no lesions.  Light bleeding from cervical os.   Consent done for procedures.               Pelvic US Uterus 7.83 x 4.94 x 3.67 cm.  Fibroids:  0.85 cm, 1.10 cm, 1.91 cm.  Cervical fibroids:  11 x 11 mm 14 x 13 mm.  EMS 11.54 mm Left ovary not seen. Right ovary 1.63 x. 1.19 x. 1.35 cm.  No adnexal masses.  No free fluid.   Sonohysterogram Sterile prep with HIbiclens.  Tenaculum to anterior cervical lip.  Cannula placed.  Sterile NS injected. Filling defect:  31 x 10 mm  Endometrial biopsy Sterile prep with Hibiclens.  Tenaculum to anterior cervical lip. Pipelle passed x 2.  Tissue to pathology.  No complications.  Minimal EBL.  Chaperone was present for exam:  Warren Lacy, CMA  ASSESSMENT:  Postmenopausal bleeding.  Thickened endometrium.  Endometrial mass. FH ovarian cancer.   Status post hysteroscopy with Myosure resection of benign endometrial polyp, dilation and curettage 08/22/21.  Mixed incontinence.  Seeing urogynecology at [facility]. Hx umbilical herniorrhaphy with mesh placement.  Hx cardiac stent.  Recent discontinuation of Plavix.   PLAN:  Ultrasound findings reviewed with patient.  FU EMB.  I anticipate surgical care:  hysteroscopy versus hysterectomy.   Brief discussion and ACOG brochures given to patient.  May need cardiac clearance.    25 min  total time was spent for this patient encounter, including preparation, face-to-face counseling with the patient, coordination of care, and documentation of the encounter in addition to doing the sonohysterogram and endometrial biopsy.

## 2023-01-25 DIAGNOSIS — H43811 Vitreous degeneration, right eye: Secondary | ICD-10-CM | POA: Diagnosis not present

## 2023-01-25 DIAGNOSIS — H35373 Puckering of macula, bilateral: Secondary | ICD-10-CM | POA: Diagnosis not present

## 2023-01-25 DIAGNOSIS — Z961 Presence of intraocular lens: Secondary | ICD-10-CM | POA: Diagnosis not present

## 2023-01-30 DIAGNOSIS — Z23 Encounter for immunization: Secondary | ICD-10-CM | POA: Diagnosis not present

## 2023-02-06 ENCOUNTER — Other Ambulatory Visit (HOSPITAL_COMMUNITY)
Admission: RE | Admit: 2023-02-06 | Discharge: 2023-02-06 | Disposition: A | Discharge status: Home / Self Care | Payer: Medicare Other | Source: Ambulatory Visit | Attending: Obstetrics and Gynecology | Admitting: Obstetrics and Gynecology

## 2023-02-06 ENCOUNTER — Ambulatory Visit (INDEPENDENT_AMBULATORY_CARE_PROVIDER_SITE_OTHER): Payer: Medicare Other

## 2023-02-06 ENCOUNTER — Encounter: Payer: Self-pay | Admitting: Obstetrics and Gynecology

## 2023-02-06 ENCOUNTER — Ambulatory Visit (INDEPENDENT_AMBULATORY_CARE_PROVIDER_SITE_OTHER): Payer: Medicare Other | Admitting: Obstetrics and Gynecology

## 2023-02-06 ENCOUNTER — Other Ambulatory Visit: Payer: Self-pay | Admitting: Obstetrics and Gynecology

## 2023-02-06 VITALS — BP 136/74 | HR 93 | Wt 235.0 lb

## 2023-02-06 DIAGNOSIS — N950 Postmenopausal bleeding: Secondary | ICD-10-CM

## 2023-02-06 DIAGNOSIS — R9389 Abnormal findings on diagnostic imaging of other specified body structures: Secondary | ICD-10-CM

## 2023-02-06 DIAGNOSIS — N9489 Other specified conditions associated with female genital organs and menstrual cycle: Secondary | ICD-10-CM | POA: Diagnosis not present

## 2023-02-06 NOTE — Patient Instructions (Signed)
 Endometrial Biopsy  An endometrial biopsy is a procedure where a tissue sample is removed from the lining of the uterus. This lining is called the endometrium. The tissue sample is then sent to a lab for testing. You may have this type of biopsy to check for: Cancer. Infection. Growths called polyps. Uterine bleeding that can't be explained. Tell a health care provider about: Any allergies you have. All medicines you're taking including vitamins, herbs, eye drops, creams, and over-the-counter medicines. Any problems you or family members have had with anesthesia. Any bleeding problems you have. Any surgeries you have had. Any medical problems you have. Whether you're pregnant or may be pregnant. What are the risks? Your health care provider will talk with you about risks. These may include: Bleeding. Infection. Allergic reactions to medicines. Damage to the wall of the uterus. This is rare. What happens before the procedure? Keep track of your period. You may need to have this biopsy when you're not having your period. Ask your provider about: Changing or stopping your regular medicines. These include any diabetes medicines or blood thinners you take. Taking medicines such as aspirin and ibuprofen. These medicines can thin your blood. Do not take them unless your provider tells you to. Taking over-the-counter medicines, vitamins, herbs, and supplements. Bring a pad with you. You may need to wear one after the biopsy. Plan to have a responsible adult take you home from the hospital or clinic. You won't be allowed to drive. What happens during the procedure? A tool will be put into your vagina to hold it open. This helps your provider see the cervix. The cervix is the lowest part of the uterus. Your cervix will be cleaned with a solution that kills germs. You will be given anesthesia. This keeps you from feeling pain. It will numb your cervix. A tool called forceps will be used to  hold your cervix steady. A thin tool called a uterine sound will be put through your cervix. It will be used to: Find the length of your uterus. Find where to take the sample from. A soft tube called a catheter will be put into your uterus. The catheter will remove a tissue sample. The tube and tools will be removed. The sample will be sent to a lab for testing. The procedure may vary among providers and hospitals. What happens after the procedure? Your blood pressure, heart rate, breathing rate, and blood oxygen level will be monitored until you leave the hospital or clinic. It's up to you to get the results of your procedure. Ask your provider, or the department that is doing the procedure, when your results will be ready. This information is not intended to replace advice given to you by your health care provider. Make sure you discuss any questions you have with your health care provider. Document Revised: 03/14/2022 Document Reviewed: 03/14/2022 Elsevier Patient Education  2024 [employer].

## 2023-02-07 ENCOUNTER — Encounter: Payer: Self-pay | Admitting: Family Medicine

## 2023-02-07 DIAGNOSIS — J811 Chronic pulmonary edema: Secondary | ICD-10-CM | POA: Diagnosis not present

## 2023-02-07 DIAGNOSIS — U071 COVID-19: Secondary | ICD-10-CM | POA: Diagnosis not present

## 2023-02-07 DIAGNOSIS — R0609 Other forms of dyspnea: Secondary | ICD-10-CM | POA: Diagnosis not present

## 2023-02-07 DIAGNOSIS — G4733 Obstructive sleep apnea (adult) (pediatric): Secondary | ICD-10-CM | POA: Diagnosis not present

## 2023-02-07 DIAGNOSIS — J984 Other disorders of lung: Secondary | ICD-10-CM | POA: Diagnosis not present

## 2023-02-08 LAB — SURGICAL PATHOLOGY

## 2023-02-10 ENCOUNTER — Ambulatory Visit
Admission: RE | Admit: 2023-02-10 | Discharge: 2023-02-10 | Disposition: A | Discharge status: Home / Self Care | Payer: Medicare Other | Source: Ambulatory Visit | Attending: Family Medicine | Admitting: Family Medicine

## 2023-02-10 DIAGNOSIS — R1900 Intra-abdominal and pelvic swelling, mass and lump, unspecified site: Secondary | ICD-10-CM

## 2023-02-10 DIAGNOSIS — R109 Unspecified abdominal pain: Secondary | ICD-10-CM

## 2023-02-10 DIAGNOSIS — K760 Fatty (change of) liver, not elsewhere classified: Secondary | ICD-10-CM | POA: Diagnosis not present

## 2023-02-10 MED ORDER — GADOPICLENOL 0.5 MMOL/ML IV SOLN
10.0000 mL | Freq: Once | INTRAVENOUS | Status: AC | PRN
  Administered 2023-02-10: 10 mL via INTRAVENOUS

## 2023-02-11 ENCOUNTER — Encounter: Payer: Self-pay | Admitting: Obstetrics and Gynecology

## 2023-02-15 ENCOUNTER — Other Ambulatory Visit: Payer: Self-pay | Admitting: Pharmacist

## 2023-02-15 DIAGNOSIS — E785 Hyperlipidemia, unspecified: Secondary | ICD-10-CM

## 2023-02-15 DIAGNOSIS — I2510 Atherosclerotic heart disease of native coronary artery without angina pectoris: Secondary | ICD-10-CM

## 2023-02-19 ENCOUNTER — Encounter: Payer: Self-pay | Admitting: Family Medicine

## 2023-02-20 ENCOUNTER — Encounter: Payer: Self-pay | Admitting: Obstetrics and Gynecology

## 2023-02-20 ENCOUNTER — Ambulatory Visit (INDEPENDENT_AMBULATORY_CARE_PROVIDER_SITE_OTHER): Payer: Medicare Other | Admitting: Obstetrics and Gynecology

## 2023-02-20 DIAGNOSIS — N3281 Overactive bladder: Secondary | ICD-10-CM

## 2023-02-20 DIAGNOSIS — R350 Frequency of micturition: Secondary | ICD-10-CM

## 2023-02-20 NOTE — Progress Notes (Signed)
 [facility]  PTNS VISIT  CC:  Overactive bladder  73 y.o. with refractory overactive bladder who presents for percutaneous tibial nerve stimulation. The patient presents for PTNS maintenance session # 1.   Procedure: The patient was placed in the sitting position and the left lower extremity was prepped in the usual fashion. The PTNS needle was then inserted at a 60 degree angle, 5 cm cephalad and 2 cm posterior to the medial malleolus. The PTNS unit was then programmed and an optimal response was noted at 3 milliamps. The PTNS stimulation was then performed at this setting for 30 minutes without incident and the patient tolerated the procedure well. The needle was removed and hemostasis was noted.   The pt will return in 6 weeks for PTNS session # 2. All questions were answered.

## 2023-02-23 NOTE — Progress Notes (Signed)
[name] [address] [place] [address] Phone: [phone] Subjective:   Kerry [name] am a scribe for Dr. Katrinka Blazing.  I'm seeing this patient by the request  of:  Street, [name], MD  CC: Back pain follow-up  [id number]  01/12/2023 Continues to have arthritis of multiple joints. Different medications at this time. We did discuss that the supplement to patient's could be facial. Patient is continuing to work on her other comorbidities with other physicians. We did discuss the possibility of repeating CT scan in 2 months to further evaluate the interstitial lung disease that is a potential. Patient has followed up with cardiology for some of the shortness of breath and the CAD. Laboratory workup seems to be doing well. Will continue to monitor with patient being on Repatha as well as Lasix recently. Follow-up with me again in 6 to 8 weeks otherwise. Total time reviewing outside records, discussing with patient today 31 minutes does not include osteopathic manipulation   Updated 02/26/2023 Kerry [name] is a 73 y.o. female coming in with complaint of polyarthralgia, continue to have significant difficulties over the course of time.  Patient did send a message and does state that she was having muscle spasms and a very intermittent feeling on the right side of her neck.  Had difficulty even using the right upper extremity secondary to the pain.  Was not secondary to weakness just was more pain in the neck when she used the arm.  Patient has been using a percutaneous tibial nerve stimulator and that has helped her right knee significantly.  Has noticed that some of her pain with the turmeric seems to be doing better.  Patient has seen pulmonary recently as well. Patient had a repeat chest x-ray in January 24 that showed that patient had more of congestive heart failure than pulmonary pathology.  Still awaiting surgery for abnormal  endometrial biopsy  Patient did have an MRI of the abdomen done showed some fatty liver but no biliary dilation noted. Patient did have the MRI of the pelvis that did show the patient had a thickening of the endometrium noted.   Patient states for the most part she is doing ok. Had something funny happened this morning with her right knee after shower. It popped multiple times. Not hurting currently. Other areas of pain are the usual places.  Past Medical History:  Diagnosis Date   Abnormal uterine bleeding    Allergic rhinitis, cause unspecified    Arthritis of knee, right    Bursitis of left shoulder    Cherry hemangioma 07/28/2015   vulva   Chronic pain    Coronary artery disease    Dyspareunia    Elevated cholesterol    Elevated uric acid in blood 2021   Fibroid    Foot fracture, left 01/2015   and torn tendons   GERD (gastroesophageal reflux disease)    HTN (hypertension)    Interstitial lung disease (HCC)    Kidney stones    Sleep apnea    Urinary incontinence    Past Surgical History:  Procedure Laterality Date    dilatation and curettage  2000   CARPAL TUNNEL RELEASE  1998   CARPAL TUNNEL RELEASE Left 05/21/2020   CATARACT EXTRACTION Bilateral    CORONARY STENT INTERVENTION N/A 01/26/2022   Procedure: CORONARY STENT INTERVENTION;  Surgeon: Kathleene Hazel, MD;  Location: [facility];  Service: Cardiovascular;  Laterality: N/A;   dental implants  2010   DENTAL SURGERY     DILATATION & CURETTAGE/HYSTEROSCOPY WITH MYOSURE N/A 08/22/2021   Procedure: DILATATION & CURETTAGE/HYSTEROSCOPY WITH MYOSURE RESECTION OF ENDOMETRIAL MASS;  Surgeon: Patton Salles, MD;  Location: [facility];  Service: Gynecology;  Laterality: N/A;   ESOPHAGEAL DILATION  2010   HAMMER TOE SURGERY     rt   LEFT HEART CATH AND CORONARY ANGIOGRAPHY N/A 01/25/2022   Procedure: LEFT HEART CATH AND CORONARY ANGIOGRAPHY;  Surgeon: Lennette Bihari, MD;  Location: [facility];  Service: Cardiovascular;  Laterality: N/A;   OPERATIVE ULTRASOUND N/A 08/22/2021   Procedure: OPERATIVE ULTRASOUND GUIDED HYSTEROSCOPY;  Surgeon: Patton Salles, MD;  Location: [facility];  Service: Gynecology;  Laterality: N/A;   PELVIC LAPAROSCOPY  1990   ROTATOR CUFF REPAIR Right 09/2011   Dr. Ave Filter   ROTATOR CUFF REPAIR Left 11/11/2013   Dr. Ave Filter    SEPTOPLASTY     UMBILICAL HERNIA REPAIR  2010   Patient reports mesh was used for the repair.   Social History   Socioeconomic History   Marital status: Married    Spouse name: Not on file   Number of children: Not on file   Years of education: Not on file   Highest education level: Not on file  Occupational History   Occupation: Retired  Tobacco Use   Smoking status: Never   Smokeless tobacco: Never  Vaping Use   Vaping status: Never Used  Substance and Sexual Activity   Alcohol use: No    Alcohol/week: 0.0 standard drinks of alcohol   Drug use: No   Sexual activity: Not Currently    Partners: Male    Birth control/protection: Post-menopausal  Other Topics Concern   Not on file  Social History Narrative   Are you right handed or left handed? Right handed   Are you currently employed ? No retired   What is your current occupation?   Do you live at home alone? No   Who lives with you? Lives with husband.    What type of home do you live in: 1 story or 2 story? Two story home.       Social Drivers of Corporate investment banker Strain: Not on file  Food Insecurity: Low Risk  (02/07/2023)   Received from [facility]   Hunger Vital Sign    Worried About Running Out of Food in the Last Year: Never true    Ran Out of Food in the Last Year: Never true  Transportation Needs: No Transportation Needs (02/07/2023)   Received from [facility]    In the past 12 months, has lack of reliable transportation kept you from medical appointments, meetings, work or  from getting things needed for daily living? : No  Physical Activity: Not on file  Stress: Not on file  Social Connections: Not on file   Allergies  Allergen Reactions   Meloxicam Other (See Comments)    Made patient have stomach pain ,sore throat, joint pains, face flush    Molds & Smuts Other (See Comments), Palpitations, Shortness Of Breath and Swelling   Cefixime Swelling   Celebrex [Celecoxib] Swelling   Cobalt Other (See Comments)    Per allergist   Gabapentin Other (See Comments)    Joint pain   Levofloxacin Other (See Comments)    Muscle and joint pain   Molybdenum Other (See Comments)  Per allergist   Other Other (See Comments)    TEQUIN. TEQUIN - JOINT AND MUSCLE PAIN Other Reaction: painful joints TEQUIN. TEQUIN - JOINT AND MUSCLE PAIN Tantal Metal   Penicillins Hives   Robaxin [Methocarbamol]    Statins Other (See Comments)    Muscle pain   Tomato Hives   Adhesive [Tape] Rash    Sensitive to EKG leads; sensitive to 52M Micropore tape   Dust Mite Extract Itching, Other (See Comments) and Palpitations   Tetracycline Hcl Rash    Can take Doxy   Tetracyclines & Related Rash   Family History  Problem Relation Age of Onset   Rheumatologic disease Mother    Diabetes Mother    Heart attack Father    Rheumatologic disease Sister    Arthritis Sister        --Rheumatoid arthritis   Ovarian cancer Sister 65   Crohn's disease Brother    Kidney failure Brother    Ulcerative colitis Brother    Glaucoma Brother      Current Outpatient Medications (Cardiovascular):    amLODipine (NORVASC) 5 MG tablet, Take 1 tablet (5 mg total) by mouth daily.   Evolocumab (REPATHA SURECLICK) 140 MG/ML SOAJ, Inject 140 mg into the skin every 14 (fourteen) days.   furosemide (LASIX) 40 MG tablet, Take 1 tablet (40 mg total) by mouth every other day.   metoprolol succinate (TOPROL-XL) 25 MG 24 hr tablet, Take 25 mg by mouth daily.   nitroGLYCERIN (NITROSTAT) 0.4 MG SL tablet, Place  1 tablet (0.4 mg total) under the tongue every 5 (five) minutes x 3 doses as needed for chest pain.  Current Outpatient Medications (Respiratory):    Sodium Chloride-Xylitol (XLEAR SINUS CARE SPRAY NA), Place into the nose. 2 sprays at bedtime  Current Outpatient Medications (Analgesics):    aspirin EC 81 MG tablet, Take 1 tablet (81 mg total) by mouth daily. Swallow whole.  Current Outpatient Medications (Hematological):    cyanocobalamin (VITAMIN B12) 1000 MCG tablet, Take 1,000 mcg by mouth every Monday, Wednesday, and Friday.   cyanocobalamin (VITAMIN B12) 1000 MCG/ML injection, Inject 1,000 mcg into the muscle once. Once a month  Current Outpatient Medications (Other):    ascorbic acid (VITAMIN C) 250 MG tablet, Take 250 mg by mouth daily.   carboxymethylcellulose (REFRESH PLUS) 0.5 % SOLN, 1 drop 2 (two) times daily as needed (for dry eyes).   CINNAMON PO, Take 2.5 mLs by mouth daily.   Coenzyme Q10 200 MG capsule, Take 200 mg by mouth daily.   MISC NATURAL PRODUCTS PO, Take by mouth. Black currant seed oil   Multiple Vitamins-Minerals (SUPER MULTI-VITAMIN PO), Take 1 capsule by mouth daily. Solgar Formula VM-75   NON FORMULARY, Take 3 fluid ounces by mouth daily. Goat Kiefer   Omega-3 Fatty Acids (FISH OIL) 500 MG CAPS, Take 500 mg by mouth daily.   potassium chloride SA (KLOR-CON M) 20 MEQ tablet, Take 1 tablet (20 mEq total) by mouth every other day.   Probiotic Product (PROBIOTIC ADVANCED PO), Take by mouth. Patient states taking 5 billion, one capsule daily. Suprema Dophilus probiotic   TART CHERRY PO, Take 1 tablet by mouth daily.   VITAMIN D-VITAMIN K PO, Take 1 capsule by mouth every other day. Vit d 5000 units and of K   Reviewed prior external information including notes and imaging from  primary care provider As well as notes that were available from care everywhere and other healthcare systems.  Past medical history,  social, surgical and family history all  reviewed in electronic medical record.  No pertanent information unless stated regarding to the chief complaint.   Review of Systems:  No headache, visual changes, nausea, vomiting, diarrhea, constipation, dizziness, abdominal pain, skin rash, fevers, chills, night sweats, weight loss, swollen lymph nodes, body aches, joint swelling, chest pain, shortness of breath, mood changes. POSITIVE muscle aches  Objective  Blood pressure (!) 160/80, pulse 92, height 4\' 10"  (1.473 m), weight 236 lb (107 kg), last menstrual period 08/16/1992, SpO2 96%.   General: No apparent distress alert and oriented x3 mood and affect normal, dressed appropriately.  HEENT: Pupils equal, extraocular movements intact  Respiratory: Patient's speak in full sentences and does not appear short of breath  Severely antalgic gait noted.  Patient is favoring the right knee.  Tender to palpation but good range of motion for patient.  No significant acute swelling today.  Patient's does have crepitus of the patellofemoral joint but no significant locking.     Osteopathic findings C2 flexed rotated and side bent right C4 flexed rotated and side bent left C6 flexed rotated and side bent left T3 extended rotated and side bent right inhaled third rib T9 extended rotated and side bent left L2 flexed rotated and side bent right Sacrum right on right  Impression and Recommendations:  Arthritis of knee, degenerative Significant arthritis of the right knee noted.  We discussed with patient that the possible more of a patellofemoral subluxation that did occur.  Discussed icing regimen and home exercises.  Increase activity slowly otherwise.  Discussed icing regimen, topical anti-inflammatories.  Discussed increasing activity as tolerated.  Follow-up again in 6 to 8 weeks otherwise.  Can do injections if needed.  Chronic pain Continue to work on patients with with different natural aspects.  Patient is going to continue to work on  weight.  Attempted multiple different medications but unfortunately secondary to side effects continues to have difficulty.  Does respond extremely well though to osteopathic manipulation and more of a muscle energy.  Using that as one of the treatment options after evaluation.  Patient will be having surgery at some point.  Follow-up with me again in 6 to 8 weeks.  Family history of ovarian cancer Family history of ovarian cancer and does have a endometrial mass and is going to have surgical intervention in the near future.    Decision today to treat with OMT was based on Physical Exam  After verbal consent patient was treated with , ME, FPR techniques in cervical, thoracic, rib, lumbar and sacral areas, all areas are chronic   Patient tolerated the procedure well with improvement in symptoms  Patient given exercises, stretches and lifestyle modifications  See medications in patient instructions if given  Patient will follow up in 4-8 weeks  The above documentation has been reviewed and is accurate and complete Judi Saa, DO

## 2023-02-26 ENCOUNTER — Ambulatory Visit: Payer: Medicare Other | Admitting: Family Medicine

## 2023-02-26 VITALS — BP 160/80 | HR 92 | Ht <= 58 in | Wt 236.0 lb

## 2023-02-26 DIAGNOSIS — M170 Bilateral primary osteoarthritis of knee: Secondary | ICD-10-CM

## 2023-02-26 DIAGNOSIS — M9908 Segmental and somatic dysfunction of rib cage: Secondary | ICD-10-CM

## 2023-02-26 DIAGNOSIS — M9901 Segmental and somatic dysfunction of cervical region: Secondary | ICD-10-CM | POA: Diagnosis not present

## 2023-02-26 DIAGNOSIS — M9903 Segmental and somatic dysfunction of lumbar region: Secondary | ICD-10-CM

## 2023-02-26 DIAGNOSIS — M9904 Segmental and somatic dysfunction of sacral region: Secondary | ICD-10-CM | POA: Diagnosis not present

## 2023-02-26 DIAGNOSIS — G894 Chronic pain syndrome: Secondary | ICD-10-CM | POA: Diagnosis not present

## 2023-02-26 DIAGNOSIS — M9902 Segmental and somatic dysfunction of thoracic region: Secondary | ICD-10-CM | POA: Diagnosis not present

## 2023-02-26 DIAGNOSIS — Z8041 Family history of malignant neoplasm of ovary: Secondary | ICD-10-CM | POA: Diagnosis not present

## 2023-02-26 NOTE — Patient Instructions (Signed)
 Good to see you. Wearable ultrasound. 6 weeks follow up.

## 2023-02-27 ENCOUNTER — Encounter: Payer: Self-pay | Admitting: Family Medicine

## 2023-02-27 NOTE — Assessment & Plan Note (Signed)
Family history of ovarian cancer and does have a endometrial mass and is going to have surgical intervention in the near future.

## 2023-02-27 NOTE — Assessment & Plan Note (Signed)
Significant arthritis of the right knee noted.  We discussed with patient that the possible more of a patellofemoral subluxation that did occur.  Discussed icing regimen and home exercises.  Increase activity slowly otherwise.  Discussed icing regimen, topical anti-inflammatories.  Discussed increasing activity as tolerated.  Follow-up again in 6 to 8 weeks otherwise.  Can do injections if needed.

## 2023-02-27 NOTE — Assessment & Plan Note (Signed)
Continue to work on patients with with different natural aspects.  Patient is going to continue to work on weight.  Attempted multiple different medications but unfortunately secondary to side effects continues to have difficulty.  Does respond extremely well though to osteopathic manipulation and more of a muscle energy.  Using that as one of the treatment options after evaluation.  Patient will be having surgery at some point.  Follow-up with me again in 6 to 8 weeks.

## 2023-02-28 ENCOUNTER — Telehealth: Payer: Self-pay | Admitting: Genetic Counselor

## 2023-02-28 ENCOUNTER — Ambulatory Visit: Payer: Medicare Other | Admitting: Obstetrics and Gynecology

## 2023-02-28 ENCOUNTER — Encounter: Payer: Self-pay | Admitting: Obstetrics and Gynecology

## 2023-02-28 VITALS — BP 144/76 | HR 91 | Temp 98.3°F | Ht 60.0 in | Wt 233.0 lb

## 2023-02-28 DIAGNOSIS — R9389 Abnormal findings on diagnostic imaging of other specified body structures: Secondary | ICD-10-CM

## 2023-02-28 DIAGNOSIS — N950 Postmenopausal bleeding: Secondary | ICD-10-CM

## 2023-02-28 DIAGNOSIS — Z8041 Family history of malignant neoplasm of ovary: Secondary | ICD-10-CM | POA: Diagnosis not present

## 2023-02-28 DIAGNOSIS — N9489 Other specified conditions associated with female genital organs and menstrual cycle: Secondary | ICD-10-CM | POA: Diagnosis not present

## 2023-02-28 NOTE — Telephone Encounter (Signed)
Left the patient a voicemail to call back for genetic counseling.

## 2023-02-28 NOTE — H&P (View-Only) (Signed)
 73 y.o. G16P2002 female with significant coronary artery disease s/p coronary stent placement (2024), HTN, OSA, GERD here for surgical consultation for postmenopausal bleeding and endometrial mass.  Patient is status post hysteroscopy D&C in 2023 for benign endometrial polyp and submucosal fibroid.  Patient also has known cervical and uterine fibroids. Married, presents with her husband.  Patient's last menstrual period was 08/16/1992.  Patient reports she has some bleeding overnight.  She wants to discuss hysteroscopy versus hysterectomy.  Patient underwent coronary artery stent placement in January 2024 was on Plavix X and aspirin for 6 months.  Recently concern for congestive HF, started on lasix. SOB and CP resolved since starting lasix. Declined ever using nitroglycerin for chest pain.  11/22/2022 EMB: Scant atrophic endometrial epithelium    Component Value Date/Time   DIAGPAP  11/22/2022 1453    - Negative for Intraepithelial Lesions or Malignancy (NILM)   DIAGPAP - Benign reactive/reparative changes 11/22/2022 1453   DIAGPAP  09/15/2020 0831    - Negative for intraepithelial lesion or malignancy (NILM)   HPVHIGH Negative 11/22/2022 1453   HPVHIGH Negative 09/15/2020 0831   ADEQPAP  11/22/2022 1453    Satisfactory for evaluation; transformation zone component PRESENT.   ADEQPAP  09/15/2020 0831    Satisfactory for evaluation; transformation zone component PRESENT.   ADEQPAP  08/07/2018 0000    Satisfactory for evaluation  endocervical/transformation zone component PRESENT.  02/06/2023 SIS with filling defect:  31 x 10 mm, and thickened endometrium. EMB with scant endometrium.  GYN HISTORY: 2023 D&C hysteroscopy with benign polyp and submucosal fibroid sampling.  OB History  Gravida Para Term Preterm AB Living  2 2 2   2   SAB IAB Ectopic Multiple Live Births      2    # Outcome Date GA Lbr Len/2nd Weight Sex Type Anes PTL Lv  2 Term      Vag-Spont     1 Term       Vag-Forceps       Past Medical History:  Diagnosis Date   Abnormal uterine bleeding    Allergic rhinitis, cause unspecified    Arthritis of knee, right    Bursitis of left shoulder    Cherry hemangioma 07/28/2015   vulva   Chronic pain    Coronary artery disease    Dyspareunia    Elevated cholesterol    Elevated uric acid in blood 2021   Fibroid    Foot fracture, left 01/2015   and torn tendons   GERD (gastroesophageal reflux disease)    HTN (hypertension)    Interstitial lung disease (HCC)    Kidney stones    Sleep apnea    Urinary incontinence     Past Surgical History:  Procedure Laterality Date    dilatation and curettage  2000   CARPAL TUNNEL RELEASE  1998   CARPAL TUNNEL RELEASE Left 05/21/2020   CATARACT EXTRACTION Bilateral    CORONARY STENT INTERVENTION N/A 01/26/2022   Procedure: CORONARY STENT INTERVENTION;  Surgeon: Kathleene Hazel, MD;  Location: [facility];  Service: Cardiovascular;  Laterality: N/A;   dental implants  2010   DENTAL SURGERY     DILATATION & CURETTAGE/HYSTEROSCOPY WITH MYOSURE N/A 08/22/2021   Procedure: DILATATION & CURETTAGE/HYSTEROSCOPY WITH MYOSURE RESECTION OF ENDOMETRIAL MASS;  Surgeon: Patton Salles, MD;  Location: [facility];  Service: Gynecology;  Laterality: N/A;   ESOPHAGEAL DILATION  2010   HAMMER TOE SURGERY     rt  LEFT HEART CATH AND CORONARY ANGIOGRAPHY N/A 01/25/2022   Procedure: LEFT HEART CATH AND CORONARY ANGIOGRAPHY;  Surgeon: Lennette Bihari, MD;  Location: [facility];  Service: Cardiovascular;  Laterality: N/A;   OPERATIVE ULTRASOUND N/A 08/22/2021   Procedure: OPERATIVE ULTRASOUND GUIDED HYSTEROSCOPY;  Surgeon: Patton Salles, MD;  Location: [facility];  Service: Gynecology;  Laterality: N/A;   PELVIC LAPAROSCOPY  1990   ROTATOR CUFF REPAIR Right 09/2011   Dr. Ave Filter   ROTATOR CUFF REPAIR Left 11/11/2013   Dr. Ave Filter    SEPTOPLASTY      UMBILICAL HERNIA REPAIR  2010   Patient reports mesh was used for the repair.    Current Outpatient Medications on File Prior to Visit  Medication Sig Dispense Refill   amLODipine (NORVASC) 5 MG tablet Take 1 tablet (5 mg total) by mouth daily. 90 tablet 3   ascorbic acid (VITAMIN C) 250 MG tablet Take 250 mg by mouth daily.     aspirin EC 81 MG tablet Take 1 tablet (81 mg total) by mouth daily. Swallow whole. 100 tablet 3   carboxymethylcellulose (REFRESH PLUS) 0.5 % SOLN 1 drop 2 (two) times daily as needed (for dry eyes).     Coenzyme Q10 200 MG capsule Take 200 mg by mouth daily.     cyanocobalamin (VITAMIN B12) 1000 MCG tablet Take 1,000 mcg by mouth every Monday, Wednesday, and Friday.     cyanocobalamin (VITAMIN B12) 1000 MCG/ML injection Inject 1,000 mcg into the muscle once. Once a month     Evolocumab (REPATHA SURECLICK) 140 MG/ML SOAJ Inject 140 mg into the skin every 14 (fourteen) days. 2 mL 11   FIBER PO Take by mouth.     furosemide (LASIX) 40 MG tablet Take 1 tablet (40 mg total) by mouth every other day. 90 tablet 3   metoprolol succinate (TOPROL-XL) 25 MG 24 hr tablet Take 25 mg by mouth daily.     Multiple Vitamins-Minerals (SUPER MULTI-VITAMIN PO) Take 1 capsule by mouth daily. Solgar Formula VM-75     nitroGLYCERIN (NITROSTAT) 0.4 MG SL tablet Place 1 tablet (0.4 mg total) under the tongue every 5 (five) minutes x 3 doses as needed for chest pain. 25 tablet 1   NON FORMULARY Take 3 fluid ounces by mouth daily. Goat Kefir     Omega-3 Fatty Acids (FISH OIL) 500 MG CAPS Take 500 mg by mouth daily.     potassium chloride SA (KLOR-CON M) 20 MEQ tablet Take 1 tablet (20 mEq total) by mouth every other day. 90 tablet 3   Probiotic Product (PROBIOTIC ADVANCED PO) Take by mouth. Patient states taking 5 billion, one capsule daily. Suprema Dophilus probiotic     Sodium Chloride-Xylitol (XLEAR SINUS CARE SPRAY NA) Place into the nose. 2 sprays at bedtime     TART CHERRY PO Take 1  tablet by mouth daily.     Turmeric (QC TUMERIC COMPLEX PO) Take by mouth.     VITAMIN D-VITAMIN K PO Take 1 capsule by mouth every other day. Vit d 5000 units and of K     CINNAMON PO Take 2.5 mLs by mouth daily. (Patient not taking: Reported on 02/28/2023)     MISC NATURAL PRODUCTS PO Take by mouth. Black currant seed oil (Patient not taking: Reported on 02/28/2023)     No current facility-administered medications on file prior to visit.    Allergies  Allergen Reactions   Meloxicam Other (See Comments)  Made patient have stomach pain ,sore throat, joint pains, face flush    Molds & Smuts Other (See Comments), Palpitations, Shortness Of Breath and Swelling   Cefixime Swelling   Celebrex [Celecoxib] Swelling   Cobalt Other (See Comments)    Per allergist   Gabapentin Other (See Comments)    Joint pain   Levofloxacin Other (See Comments)    Muscle and joint pain   Molybdenum Other (See Comments)    Per allergist   Other Other (See Comments)    TEQUIN. TEQUIN - JOINT AND MUSCLE PAIN Other Reaction: painful joints TEQUIN. TEQUIN - JOINT AND MUSCLE PAIN Tantal Metal   Penicillins Hives   Robaxin [Methocarbamol]    Statins Other (See Comments)    Muscle pain   Tomato Hives   Adhesive [Tape] Rash    Sensitive to EKG leads; sensitive to 87M Micropore tape   Dust Mite Extract Itching, Other (See Comments) and Palpitations   Tetracycline Hcl Rash    Can take Doxy   Tetracyclines & Related Rash      PE Today's Vitals   02/28/23 1218  BP: (!) 144/76  Pulse: 91  Temp: 98.3 F (36.8 C)  TempSrc: Oral  SpO2: 96%  Weight: 233 lb (105.7 kg)  Height: 5' (1.524 m)   Body mass index is 45.5 kg/m.  Physical Exam Vitals reviewed.  Constitutional:      General: She is not in acute distress.    Appearance: Normal appearance.  HENT:     Head: Normocephalic and atraumatic.     Nose: Nose normal.  Eyes:     Extraocular Movements: Extraocular movements intact.      Conjunctiva/sclera: Conjunctivae normal.  Cardiovascular:     Rate and Rhythm: Normal rate and regular rhythm.     Heart sounds: No murmur heard.    No friction rub. No gallop.  Pulmonary:     Effort: Pulmonary effort is normal. No respiratory distress.     Breath sounds: Normal breath sounds. No stridor. No wheezing, rhonchi or rales.  Musculoskeletal:        General: Normal range of motion.     Cervical back: Normal range of motion.  Neurological:     General: No focal deficit present.     Mental Status: She is alert.  Psychiatric:        Mood and Affect: Mood normal.        Behavior: Behavior normal.      Assessment and Plan:        Postmenopausal bleeding Endometrial mass Thickened endometrium   Assessment & Plan: Patient presents for surgical consultation for postmenopausal bleeding with endometrial mass on transvaginal ultrasound, 02/06/2023. Patient has a history of endometrial polyps and submucosal fibroids, status post operative hysteroscopy in 2023. Recommend operative hysteroscopy with resection of endometrial mass, thought to be 3 cm polyp. Patient had an endometrial biopsies November 2024 & January 2025 with scant endometrial samples. Briefly discussed hysterectomy as patient has had a prior hysteroscopy, however after risk and benefits discussions the surgical risk were felt to outweigh the benefits.  Plan for operative hysteroscopy, polypectomy, endometrial sampling, dilation and curettage.  Discussed outpatient procedure. Reviewed that  recovery is usually 1-2 days. Risks including infections, bleeding, and damage to surrounding organs reviewed. Recommend NPO prior to midnight and reviewed medication to take on day of surgery. Dicussed use of NSAIDS as needed for pain postoperatively.  Preop checklist: Antibiotics: none DVT ppx: SCDs Postop visit: 1 week Additional clearance: medical,  cardiac   Family history of ovarian cancer -     Ambulatory referral to  Genetics After discussion, patient opted for genetics counseling.    Rosalyn Gess, MD

## 2023-02-28 NOTE — Progress Notes (Signed)
73 y.o. G16P2002 female with significant coronary artery disease s/p coronary stent placement (2024), HTN, OSA, GERD here for surgical consultation for postmenopausal bleeding and endometrial mass.  Patient is status post hysteroscopy D&C in 2023 for benign endometrial polyp and submucosal fibroid.  Patient also has known cervical and uterine fibroids. Married, presents with her husband.  Patient's last menstrual period was 08/16/1992.  Patient reports she has some bleeding overnight.  She wants to discuss hysteroscopy versus hysterectomy.  Patient underwent coronary artery stent placement in January 2024 was on Plavix X and aspirin for 6 months.  Recently concern for congestive HF, started on lasix. SOB and CP resolved since starting lasix. Declined ever using nitroglycerin for chest pain.  11/22/2022 EMB: Scant atrophic endometrial epithelium    Component Value Date/Time   DIAGPAP  11/22/2022 1453    - Negative for Intraepithelial Lesions or Malignancy (NILM)   DIAGPAP - Benign reactive/reparative changes 11/22/2022 1453   DIAGPAP  09/15/2020 0831    - Negative for intraepithelial lesion or malignancy (NILM)   HPVHIGH Negative 11/22/2022 1453   HPVHIGH Negative 09/15/2020 0831   ADEQPAP  11/22/2022 1453    Satisfactory for evaluation; transformation zone component PRESENT.   ADEQPAP  09/15/2020 0831    Satisfactory for evaluation; transformation zone component PRESENT.   ADEQPAP  08/07/2018 0000    Satisfactory for evaluation  endocervical/transformation zone component PRESENT.  02/06/2023 SIS with filling defect:  31 x 10 mm, and thickened endometrium. EMB with scant endometrium.  GYN HISTORY: 2023 D&C hysteroscopy with benign polyp and submucosal fibroid sampling.  OB History  Gravida Para Term Preterm AB Living  2 2 2   2   SAB IAB Ectopic Multiple Live Births      2    # Outcome Date GA Lbr Len/2nd Weight Sex Type Anes PTL Lv  2 Term      Vag-Spont     1 Term       Vag-Forceps       Past Medical History:  Diagnosis Date   Abnormal uterine bleeding    Allergic rhinitis, cause unspecified    Arthritis of knee, right    Bursitis of left shoulder    Cherry hemangioma 07/28/2015   vulva   Chronic pain    Coronary artery disease    Dyspareunia    Elevated cholesterol    Elevated uric acid in blood 2021   Fibroid    Foot fracture, left 01/2015   and torn tendons   GERD (gastroesophageal reflux disease)    HTN (hypertension)    Interstitial lung disease (HCC)    Kidney stones    Sleep apnea    Urinary incontinence     Past Surgical History:  Procedure Laterality Date    dilatation and curettage  2000   CARPAL TUNNEL RELEASE  1998   CARPAL TUNNEL RELEASE Left 05/21/2020   CATARACT EXTRACTION Bilateral    CORONARY STENT INTERVENTION N/A 01/26/2022   Procedure: CORONARY STENT INTERVENTION;  Surgeon: Kathleene Hazel, MD;  [facility];  Service: Cardiovascular;  Laterality: N/A;   dental implants  2010   DENTAL SURGERY     DILATATION & CURETTAGE/HYSTEROSCOPY WITH MYOSURE N/A 08/22/2021   Procedure: DILATATION & CURETTAGE/HYSTEROSCOPY WITH MYOSURE RESECTION OF ENDOMETRIAL MASS;  Surgeon: Patton Salles, MD;  Location: [facility];  Service: Gynecology;  Laterality: N/A;   ESOPHAGEAL DILATION  2010   HAMMER TOE SURGERY     rt  LEFT HEART CATH AND CORONARY ANGIOGRAPHY N/A 01/25/2022   Procedure: LEFT HEART CATH AND CORONARY ANGIOGRAPHY;  Surgeon: Lennette Bihari, MD;  Location: [facility];  Service: Cardiovascular;  Laterality: N/A;   OPERATIVE ULTRASOUND N/A 08/22/2021   Procedure: OPERATIVE ULTRASOUND GUIDED HYSTEROSCOPY;  Surgeon: Patton Salles, MD;  Location: [facility];  Service: Gynecology;  Laterality: N/A;   PELVIC LAPAROSCOPY  1990   ROTATOR CUFF REPAIR Right 09/2011   Dr. Ave Filter   ROTATOR CUFF REPAIR Left 11/11/2013   Dr. Ave Filter    SEPTOPLASTY      UMBILICAL HERNIA REPAIR  2010   Patient reports mesh was used for the repair.    Current Outpatient Medications on File Prior to Visit  Medication Sig Dispense Refill   amLODipine (NORVASC) 5 MG tablet Take 1 tablet (5 mg total) by mouth daily. 90 tablet 3   ascorbic acid (VITAMIN C) 250 MG tablet Take 250 mg by mouth daily.     aspirin EC 81 MG tablet Take 1 tablet (81 mg total) by mouth daily. Swallow whole. 100 tablet 3   carboxymethylcellulose (REFRESH PLUS) 0.5 % SOLN 1 drop 2 (two) times daily as needed (for dry eyes).     Coenzyme Q10 200 MG capsule Take 200 mg by mouth daily.     cyanocobalamin (VITAMIN B12) 1000 MCG tablet Take 1,000 mcg by mouth every Monday, Wednesday, and Friday.     cyanocobalamin (VITAMIN B12) 1000 MCG/ML injection Inject 1,000 mcg into the muscle once. Once a month     Evolocumab (REPATHA SURECLICK) 140 MG/ML SOAJ Inject 140 mg into the skin every 14 (fourteen) days. 2 mL 11   FIBER PO Take by mouth.     furosemide (LASIX) 40 MG tablet Take 1 tablet (40 mg total) by mouth every other day. 90 tablet 3   metoprolol succinate (TOPROL-XL) 25 MG 24 hr tablet Take 25 mg by mouth daily.     Multiple Vitamins-Minerals (SUPER MULTI-VITAMIN PO) Take 1 capsule by mouth daily. Solgar Formula VM-75     nitroGLYCERIN (NITROSTAT) 0.4 MG SL tablet Place 1 tablet (0.4 mg total) under the tongue every 5 (five) minutes x 3 doses as needed for chest pain. 25 tablet 1   NON FORMULARY Take 3 fluid ounces by mouth daily. Goat Kefir     Omega-3 Fatty Acids (FISH OIL) 500 MG CAPS Take 500 mg by mouth daily.     potassium chloride SA (KLOR-CON M) 20 MEQ tablet Take 1 tablet (20 mEq total) by mouth every other day. 90 tablet 3   Probiotic Product (PROBIOTIC ADVANCED PO) Take by mouth. Patient states taking 5 billion, one capsule daily. Suprema Dophilus probiotic     Sodium Chloride-Xylitol (XLEAR SINUS CARE SPRAY NA) Place into the nose. 2 sprays at bedtime     TART CHERRY PO Take 1  tablet by mouth daily.     Turmeric (QC TUMERIC COMPLEX PO) Take by mouth.     VITAMIN D-VITAMIN K PO Take 1 capsule by mouth every other day. Vit d 5000 units and of K     CINNAMON PO Take 2.5 mLs by mouth daily. (Patient not taking: Reported on 02/28/2023)     MISC NATURAL PRODUCTS PO Take by mouth. Black currant seed oil (Patient not taking: Reported on 02/28/2023)     No current facility-administered medications on file prior to visit.    Allergies  Allergen Reactions   Meloxicam Other (See Comments)  Made patient have stomach pain ,sore throat, joint pains, face flush    Molds & Smuts Other (See Comments), Palpitations, Shortness Of Breath and Swelling   Cefixime Swelling   Celebrex [Celecoxib] Swelling   Cobalt Other (See Comments)    Per allergist   Gabapentin Other (See Comments)    Joint pain   Levofloxacin Other (See Comments)    Muscle and joint pain   Molybdenum Other (See Comments)    Per allergist   Other Other (See Comments)    TEQUIN. TEQUIN - JOINT AND MUSCLE PAIN Other Reaction: painful joints TEQUIN. TEQUIN - JOINT AND MUSCLE PAIN Tantal Metal   Penicillins Hives   Robaxin [Methocarbamol]    Statins Other (See Comments)    Muscle pain   Tomato Hives   Adhesive [Tape] Rash    Sensitive to EKG leads; sensitive to 87M Micropore tape   Dust Mite Extract Itching, Other (See Comments) and Palpitations   Tetracycline Hcl Rash    Can take Doxy   Tetracyclines & Related Rash      PE Today's Vitals   02/28/23 1218  BP: (!) 144/76  Pulse: 91  Temp: 98.3 F (36.8 C)  TempSrc: Oral  SpO2: 96%  Weight: 233 lb (105.7 kg)  Height: 5' (1.524 m)   Body mass index is 45.5 kg/m.  Physical Exam Vitals reviewed.  Constitutional:      General: She is not in acute distress.    Appearance: Normal appearance.  HENT:     Head: Normocephalic and atraumatic.     Nose: Nose normal.  Eyes:     Extraocular Movements: Extraocular movements intact.      Conjunctiva/sclera: Conjunctivae normal.  Cardiovascular:     Rate and Rhythm: Normal rate and regular rhythm.     Heart sounds: No murmur heard.    No friction rub. No gallop.  Pulmonary:     Effort: Pulmonary effort is normal. No respiratory distress.     Breath sounds: Normal breath sounds. No stridor. No wheezing, rhonchi or rales.  Musculoskeletal:        General: Normal range of motion.     Cervical back: Normal range of motion.  Neurological:     General: No focal deficit present.     Mental Status: She is alert.  Psychiatric:        Mood and Affect: Mood normal.        Behavior: Behavior normal.      Assessment and Plan:        Postmenopausal bleeding Endometrial mass Thickened endometrium   Assessment & Plan: Patient presents for surgical consultation for postmenopausal bleeding with endometrial mass on transvaginal ultrasound, 02/06/2023. Patient has a history of endometrial polyps and submucosal fibroids, status post operative hysteroscopy in 2023. Recommend operative hysteroscopy with resection of endometrial mass, thought to be 3 cm polyp. Patient had an endometrial biopsies November 2024 & January 2025 with scant endometrial samples. Briefly discussed hysterectomy as patient has had a prior hysteroscopy, however after risk and benefits discussions the surgical risk were felt to outweigh the benefits.  Plan for operative hysteroscopy, polypectomy, endometrial sampling, dilation and curettage.  Discussed outpatient procedure. Reviewed that  recovery is usually 1-2 days. Risks including infections, bleeding, and damage to surrounding organs reviewed. Recommend NPO prior to midnight and reviewed medication to take on day of surgery. Dicussed use of NSAIDS as needed for pain postoperatively.  Preop checklist: Antibiotics: none DVT ppx: SCDs Postop visit: 1 week Additional clearance: medical,  cardiac   Family history of ovarian cancer -     Ambulatory referral to  Genetics After discussion, patient opted for genetics counseling.    Rosalyn Gess, MD

## 2023-03-01 ENCOUNTER — Telehealth: Payer: Self-pay | Admitting: Genetic Counselor

## 2023-03-01 NOTE — Telephone Encounter (Signed)
Called the patient and patients spouse to inform them of an earlier appointment opening. Patient is aware of the changes made.

## 2023-03-01 NOTE — Telephone Encounter (Signed)
The patient called to schedule a genetic counseling appointment. Patient is aware of the appointments made and will be mailed an appointment reminder.

## 2023-03-05 ENCOUNTER — Inpatient Hospital Stay: Payer: Medicare Other

## 2023-03-05 ENCOUNTER — Inpatient Hospital Stay: Payer: Medicare Other | Attending: Genetic Counselor | Admitting: Genetic Counselor

## 2023-03-05 ENCOUNTER — Encounter: Payer: Self-pay | Admitting: Genetic Counselor

## 2023-03-05 DIAGNOSIS — Z8041 Family history of malignant neoplasm of ovary: Secondary | ICD-10-CM

## 2023-03-05 DIAGNOSIS — Z860101 Personal history of adenomatous and serrated colon polyps: Secondary | ICD-10-CM | POA: Diagnosis not present

## 2023-03-05 DIAGNOSIS — Z808 Family history of malignant neoplasm of other organs or systems: Secondary | ICD-10-CM | POA: Diagnosis not present

## 2023-03-05 DIAGNOSIS — N950 Postmenopausal bleeding: Secondary | ICD-10-CM | POA: Insufficient documentation

## 2023-03-05 DIAGNOSIS — N9489 Other specified conditions associated with female genital organs and menstrual cycle: Secondary | ICD-10-CM | POA: Insufficient documentation

## 2023-03-05 DIAGNOSIS — Z800 Family history of malignant neoplasm of digestive organs: Secondary | ICD-10-CM | POA: Diagnosis not present

## 2023-03-05 LAB — GENETIC SCREENING ORDER

## 2023-03-05 NOTE — Progress Notes (Signed)
REFERRING PROVIDER: Dr. Darrell Jewel  PRIMARY PROVIDER:  Street, [name], MD  PRIMARY REASON FOR VISIT:  1. Family history of malignant neoplasm of ovary      HISTORY OF PRESENT ILLNESS:   Kerry [name], a 73 y.o. female, was seen for a [facility] cancer genetics consultation at the request of Dr. Kennith Center due to a family history of cancer.  Kerry [name] presents to clinic today to discuss the possibility of a hereditary predisposition to cancer, genetic testing, and to further clarify her future cancer risks, as well as potential cancer risks for family members. Her spouse, Casimiro Needle, was present for this visit.   Kerry [name] is a 73 y.o. female with no personal history of cancer. She is currently being followed by her gyn for a history of abnormal uterine bleeding and an endometrial mass. History of endometrial polyp and fibroids, plan for hysteroscopy, polypectomy, endometrial sampling, dilation and curettage.   RISK FACTORS:  Menarche was at age 73.  First live birth at age 76.  OCP use for approximately 2 years.  Ovaries intact: yes.  Hysterectomy: no.  Menopausal status: postmenopausal.  HRT use: 0 years. Colonoscopy: yes;  colonoscopy 2020 detected six polyps, tubular adenoma . Patient reports f/u colonoscopy in 2023, a couple of polyps detected. Unsure of pathology.  Mammogram within the last year: yes. Number of breast biopsies: 0. Up to date with pelvic exams: yes. Any excessive radiation exposure in the past: no  Past Medical History:  Diagnosis Date   Abnormal uterine bleeding    Allergic rhinitis, cause unspecified    Arthritis of knee, right    Bursitis of left shoulder    Cherry hemangioma 07/28/2015   vulva   Chronic pain    Coronary artery disease    Dyspareunia    Elevated cholesterol    Elevated uric acid in blood 2021   Fibroid    Foot fracture, left 01/2015   and torn tendons   GERD (gastroesophageal reflux disease)    HTN (hypertension)     Interstitial lung disease (HCC)    Kidney stones    Sleep apnea    Urinary incontinence     Past Surgical History:  Procedure Laterality Date    dilatation and curettage  2000   CARPAL TUNNEL RELEASE  1998   CARPAL TUNNEL RELEASE Left 05/21/2020   CATARACT EXTRACTION Bilateral    CORONARY STENT INTERVENTION N/A 01/26/2022   Procedure: CORONARY STENT INTERVENTION;  Surgeon: Kathleene Hazel, MD;  [facility]: [facility];  Service: Cardiovascular;  Laterality: N/A;   dental implants  2010   DENTAL SURGERY     DILATATION & CURETTAGE/HYSTEROSCOPY WITH MYOSURE N/A 08/22/2021   Procedure: DILATATION & CURETTAGE/HYSTEROSCOPY WITH MYOSURE RESECTION OF ENDOMETRIAL MASS;  Surgeon: Patton Salles, MD;  Location: [facility];  Service: Gynecology;  Laterality: N/A;   ESOPHAGEAL DILATION  2010   HAMMER TOE SURGERY     rt   LEFT HEART CATH AND CORONARY ANGIOGRAPHY N/A 01/25/2022   Procedure: LEFT HEART CATH AND CORONARY ANGIOGRAPHY;  Surgeon: Lennette Bihari, MD;  [facility]: [facility];  Service: Cardiovascular;  Laterality: N/A;   OPERATIVE ULTRASOUND N/A 08/22/2021   Procedure: OPERATIVE ULTRASOUND GUIDED HYSTEROSCOPY;  Surgeon: Patton Salles, MD;  Location[facility];  Service: Gynecology;  Laterality: N/A;   PELVIC LAPAROSCOPY  1990   ROTATOR CUFF REPAIR Right 09/2011   Dr. Karie Kirks CUFF  REPAIR Left 11/11/2013   Dr. Ave Filter    SEPTOPLASTY     UMBILICAL HERNIA REPAIR  2010   Patient reports mesh was used for the repair.    Social History   Socioeconomic History   Marital status: Married    Spouse name: Not on file   Number of children: Not on file   Years of education: Not on file   Highest education level: Not on file  Occupational History   Occupation: Retired  Tobacco Use   Smoking status: Never   Smokeless tobacco: Never  Vaping Use   Vaping status: Never Used  Substance and Sexual Activity    Alcohol use: No    Alcohol/week: 0.0 standard drinks of alcohol   Drug use: No   Sexual activity: Not Currently    Partners: Male    Birth control/protection: Post-menopausal  Other Topics Concern   Not on file  Social History Narrative   Are you right handed or left handed? Right handed   Are you currently employed ? No retired   What is your current occupation?   Do you live at home alone? No   Who lives with you? Lives with husband.    What type of home do you live in: 1 story or 2 story? Two story home.       Social Drivers of Corporate investment banker Strain: Not on file  Food Insecurity: Low Risk  (02/07/2023)   Received from [facility]   Hunger Vital Sign    Worried About Running Out of Food in the Last Year: Never true    Ran Out of Food in the Last Year: Never true  Transportation Needs: No Transportation Needs (02/07/2023)   Received from [facility]    In the past 12 months, has lack of reliable transportation kept you from medical appointments, meetings, work or from getting things needed for daily living? : No  Physical Activity: Not on file  Stress: Not on file  Social Connections: Not on file     FAMILY HISTORY:  We obtained a detailed, 4-generation family history.  Significant diagnoses are listed below: Family History  Problem Relation Age of Onset   Rheumatologic disease Mother    Diabetes Mother    Heart attack Father    Basal cell carcinoma Father    Rheumatologic disease Sister    Arthritis Sister        --Rheumatoid arthritis   Ovarian cancer Sister 64   Crohn's disease Brother    Kidney failure Brother    Ulcerative colitis Brother    Glaucoma Brother    Cancer Niece 36       gyn    Kerry [name] is unaware of previous family history of genetic testing for hereditary cancer risks.  There is no reported Ashkenazi Jewish ancestry. There is no known consanguinity.  Kerry [name] reports her sister, age 16, was diagnosed with  ovarian cancer at age 2, treated with surgery only. Reports possible DES exposure for her sister. Kerry [name] reports her niece (sister's daughter) was diagnosed with malignant cells by gynecology at age 54, treated with hysterectomy. Kerry [name] reports her brother was diagnosed with skin cancer on his forehead, treated surgically, passed away at age 42 from unrelated causes. Her younger brother was diagnosed with skin cancer on his nose, treated surgically, living. She reports her father was diagnosed with [facility], passed away at age 18. She reports a maternal uncle diagnosed with brain  cancer at age 37, passed away at age 76. She reports a maternal aunt diagnosed with skin cancer, passed away at age 75. She reports a maternal aunt with lung cancer, smoking histor7, passed away at age 18. She reports a maternal first cousin diagnosed with colon cancer in his late 59s or early 20s, passed away at 3. She reports a maternal first cousin diagnosed with an unknown cancer at age 50, living at age 43.     GENETIC COUNSELING ASSESSMENT: Kerry [name] is a 73 y.o. female with a family history of cancer which is suggestive of a hereditary predisposition to cancer. We, therefore, discussed and recommended the following at today's visit.   DISCUSSION: We discussed that, in general, most cancer is not inherited in families, but instead is sporadic or familial. Sporadic cancers occur by chance and typically happen at older ages (>50 years) as this type of cancer is caused by genetic changes acquired during an individual's lifetime. Some families have more cancers than would be expected by chance; however, the ages or types of cancer are not consistent with a known genetic mutation or known genetic mutations have been ruled out. This type of familial cancer is thought to be due to a combination of multiple genetic, environmental, hormonal, and lifestyle factors. While this combination of factors likely increases the risk of  cancer, the exact source of this risk is not currently identifiable or testable.  We discussed that 5 - 10% of cancer is hereditary. We discussed that testing is beneficial for several reasons including identifying options for cancer screening and prevention, guiding treatment decisions for cancer, and understanding if other family members could be at risk for cancer and allow them to undergo genetic testing.   We reviewed the characteristics, features and inheritance patterns of hereditary cancer syndromes. We also discussed genetic testing, including the appropriate family members to test, the process of testing, insurance coverage and turn-around-time for results. We discussed the implications of a negative, positive, carrier and/or variant of uncertain significant result. Kerry [name]  was offered a common hereditary cancer panel (36+ genes) and an expanded pan-cancer panel (70+ genes). Kerry [name] was informed of the benefits and limitations of each panel, including that expanded pan-cancer panels contain genes that do not have clear management guidelines at this [name] in time.  We also discussed that as the number of genes included on a panel increases, the chances of variants of uncertain significance increases. Kerry [name] decided to pursue genetic testing for the 32 gene CancerNext-Expanded +RNAinsight panel.   Based on Kerry [name]'s family history of cancer, she meets medical criteria for genetic testing. Though Kerry [name] is not personally affected, there are no affected family members that are willing/able/available to undergo hereditary cancer testing.  Therefore, Kerry [name] the most informative family member available.  Despite that she meets criteria, she may still have an out of pocket cost. We discussed that if her out of pocket cost for testing is over $100, the laboratory will call and confirm whether she wants to proceed with testing.  If the out of pocket cost of testing is less than $100 she  will be billed by the genetic testing laboratory.   We discussed that some people do not want to undergo genetic testing due to fear of genetic discrimination.  The Genetic Information Nondiscrimination Act (GINA) was signed into federal law in 2008. GINA prohibits health insurers and most employers from discriminating against individuals based on genetic information (including the results of  genetic tests and family history information). According to GINA, health insurance companies cannot consider genetic information to be a preexisting condition, nor can they use it to make decisions regarding coverage or rates. GINA also makes it illegal for most employers to use genetic information in making decisions about hiring, firing, promotion, or terms of employment. It is important to note that GINA does not offer protections for life insurance, disability insurance, or long-term care insurance. GINA does not apply to those in the [employer], those who work for companies with less than 15 employees, and new life insurance or long-term disability insurance policies.  Health status due to a cancer diagnosis is not protected under GINA. More information about GINA can be found by visiting [employer].  We reviewed the characteristics, features and inheritance patterns of hereditary cancer syndromes. We also discussed genetic testing, including the appropriate family members to test, the process of testing, insurance coverage and turn-around-time for results. We discussed the implications of a negative, positive, variant of uncertain significant and/or unexpected results. Based on Ms. Binion's family history of cancer, she meets medical criteria for genetic testing. Despite that she meets criteria, she may still have an out of pocket cost.   PLAN: After considering the risks, benefits, and limitations, Kerry [name] provided informed consent to pursue genetic testing and the blood sample was sent to [facility] for analysis of the CancerNext-Expanded +RNAinsight. Results should be available within approximately 3 weeks' time, at which [name] they will be disclosed by telephone to Kerry [name], as will any additional recommendations warranted by these results. Kerry [name] will receive a summary of her genetic counseling visit and a copy of her results once available. This information will also be available in Epic.   Lastly, we encouraged Kerry [name] to remain in contact with cancer genetics annually so that we can continuously update the family history and inform her of any changes in cancer genetics and testing that may be of benefit for this family.   Kerry [name] questions were answered to her satisfaction today. Our contact information was provided should additional questions or concerns arise. Thank you for the referral and allowing [place] to share in the care of your patient.   Vassie Moment, MS, [facility] Licensed, Retail [email or web address] phone: [phone]  60 minutes were spent on the date of the encounter in service to the patient including preparation, face-to-face consultation, documentation and care coordination.  The patient brought her spouse, Casimiro Needle. Drs. Meliton Rattan, and/or Warren were available for questions, if needed..    _______________________________________________________________________ For Office Staff:  Number of people involved in session: 2 Was an Intern/ student involved with case: no

## 2023-03-05 NOTE — Progress Notes (Deleted)
 73 y.o. G89P2002 Married Caucasian female here for a breast and pelvic exam.    The patient is also followed for ***.  PCP: Street, [name], MD   Patient's last menstrual period was 08/16/1992.           Sexually active: No.  The current method of family planning is post menopausal status.    Menopausal hormone therapy:  *** Exercising: {yes no:314532}  {types:19826} Smoker:  no  OB History     Gravida  2   Para  2   Term  2   Preterm      AB      Living  2      SAB      IAB      Ectopic      Multiple      Live Births  2           HEALTH MAINTENANCE: Last 2 paps: 11/22/22 neg: HR HPV neg, 09/15/20 neg: HR HPV neg  History of abnormal Pap or positive HPV:  no Mammogram:  10/18/22 Breast Density Cat C, BI-RADS CAT 1 neg  Colonoscopy:  03/12/18 Bone Density:  09/10/13  Result  normal   Immunization History  Administered Date(s) Administered   Fluad Quad(high Dose 65+) 10/17/2019   Influenza Split 09/27/2020   Influenza,inj,Quad PF,6+ Mos 11/17/2014, 11/01/2017, 10/02/2018   Influenza-Unspecified 11/16/2021   PFIZER(Purple Top)SARS-COV-2 Vaccination 02/22/2019, 03/19/2019, 11/22/2019, 04/17/2020   Pfizer(Comirnaty)Fall Seasonal Vaccine 12 years and older 12/16/2021      reports that she has never smoked. She has never used smokeless tobacco. She reports that she does not drink alcohol and does not use drugs.  Past Medical History:  Diagnosis Date   Abnormal uterine bleeding    Allergic rhinitis, cause unspecified    Arthritis of knee, right    Bursitis of left shoulder    Cherry hemangioma 07/28/2015   vulva   Chronic pain    Coronary artery disease    Dyspareunia    Elevated cholesterol    Elevated uric acid in blood 2021   Fibroid    Foot fracture, left 01/2015   and torn tendons   GERD (gastroesophageal reflux disease)    HTN (hypertension)    Interstitial lung disease (HCC)    Kidney stones    Sleep apnea    Urinary  incontinence     Past Surgical History:  Procedure Laterality Date    dilatation and curettage  2000   CARPAL TUNNEL RELEASE  1998   CARPAL TUNNEL RELEASE Left 05/21/2020   CATARACT EXTRACTION Bilateral    CORONARY STENT INTERVENTION N/A 01/26/2022   Procedure: CORONARY STENT INTERVENTION;  Surgeon: Kathleene Hazel, MD;  Location: [facility];  Service: Cardiovascular;  Laterality: N/A;   dental implants  2010   DENTAL SURGERY     DILATATION & CURETTAGE/HYSTEROSCOPY WITH MYOSURE N/A 08/22/2021   Procedure: DILATATION & CURETTAGE/HYSTEROSCOPY WITH MYOSURE RESECTION OF ENDOMETRIAL MASS;  Surgeon: Patton Salles, MD;  Location: [facility];  Service: Gynecology;  Laterality: N/A;   ESOPHAGEAL DILATION  2010   HAMMER TOE SURGERY     rt   LEFT HEART CATH AND CORONARY ANGIOGRAPHY N/A 01/25/2022   Procedure: LEFT HEART CATH AND CORONARY ANGIOGRAPHY;  Surgeon: Lennette Bihari, MD;  Location: [facility];  Service: Cardiovascular;  Laterality: N/A;   OPERATIVE ULTRASOUND N/A 08/22/2021   Procedure: OPERATIVE ULTRASOUND GUIDED HYSTEROSCOPY;  Surgeon: Ardell Isaacs,  Forrestine Him, MD;  Location: [facility];  Service: Gynecology;  Laterality: N/A;   PELVIC LAPAROSCOPY  1990   ROTATOR CUFF REPAIR Right 09/2011   Dr. Ave Filter   ROTATOR CUFF REPAIR Left 11/11/2013   Dr. Ave Filter    SEPTOPLASTY     UMBILICAL HERNIA REPAIR  2010   Patient reports mesh was used for the repair.    Current Outpatient Medications  Medication Sig Dispense Refill   amLODipine (NORVASC) 5 MG tablet Take 1 tablet (5 mg total) by mouth daily. 90 tablet 3   ascorbic acid (VITAMIN C) 250 MG tablet Take 250 mg by mouth daily.     aspirin EC 81 MG tablet Take 1 tablet (81 mg total) by mouth daily. Swallow whole. 100 tablet 3   carboxymethylcellulose (REFRESH PLUS) 0.5 % SOLN 1 drop 2 (two) times daily as needed (for dry eyes).     CINNAMON PO Take 2.5 mLs by mouth daily.  (Patient not taking: Reported on 02/28/2023)     Coenzyme Q10 200 MG capsule Take 200 mg by mouth daily.     cyanocobalamin (VITAMIN B12) 1000 MCG tablet Take 1,000 mcg by mouth every Monday, Wednesday, and Friday.     cyanocobalamin (VITAMIN B12) 1000 MCG/ML injection Inject 1,000 mcg into the muscle once. Once a month     Evolocumab (REPATHA SURECLICK) 140 MG/ML SOAJ Inject 140 mg into the skin every 14 (fourteen) days. 2 mL 11   FIBER PO Take by mouth.     furosemide (LASIX) 40 MG tablet Take 1 tablet (40 mg total) by mouth every other day. 90 tablet 3   metoprolol succinate (TOPROL-XL) 25 MG 24 hr tablet Take 25 mg by mouth daily.     MISC NATURAL PRODUCTS PO Take by mouth. Black currant seed oil (Patient not taking: Reported on 02/28/2023)     Multiple Vitamins-Minerals (SUPER MULTI-VITAMIN PO) Take 1 capsule by mouth daily. Solgar Formula VM-75     nitroGLYCERIN (NITROSTAT) 0.4 MG SL tablet Place 1 tablet (0.4 mg total) under the tongue every 5 (five) minutes x 3 doses as needed for chest pain. 25 tablet 1   NON FORMULARY Take 3 fluid ounces by mouth daily. Goat Kefir     Omega-3 Fatty Acids (FISH OIL) 500 MG CAPS Take 500 mg by mouth daily.     potassium chloride SA (KLOR-CON M) 20 MEQ tablet Take 1 tablet (20 mEq total) by mouth every other day. 90 tablet 3   Probiotic Product (PROBIOTIC ADVANCED PO) Take by mouth. Patient states taking 5 billion, one capsule daily. Suprema Dophilus probiotic     Sodium Chloride-Xylitol (XLEAR SINUS CARE SPRAY NA) Place into the nose. 2 sprays at bedtime     TART CHERRY PO Take 1 tablet by mouth daily.     Turmeric (QC TUMERIC COMPLEX PO) Take by mouth.     VITAMIN D-VITAMIN K PO Take 1 capsule by mouth every other day. Vit d 5000 units and of K     No current facility-administered medications for this visit.    ALLERGIES: Meloxicam, Molds & smuts, Cefixime, Celebrex [celecoxib], Cobalt, Gabapentin, Levofloxacin, Molybdenum, Other, Penicillins,  Robaxin [methocarbamol], Statins, Tomato, Adhesive [tape], Dust mite extract, Tetracycline hcl, and Tetracyclines & related  Family History  Problem Relation Age of Onset   Rheumatologic disease Mother    Diabetes Mother    Heart attack Father    Basal cell carcinoma Father    Rheumatologic disease Sister  Arthritis Sister        --Rheumatoid arthritis   Ovarian cancer Sister 65   Crohn's disease Brother    Kidney failure Brother    Ulcerative colitis Brother    Glaucoma Brother    Cancer Niece 34       gyn    Review of Systems  PHYSICAL EXAM:  LMP 08/16/1992     General appearance: alert, cooperative and appears stated age Head: normocephalic, without obvious abnormality, atraumatic Neck: no adenopathy, supple, symmetrical, trachea midline and thyroid normal to inspection and palpation Lungs: clear to auscultation bilaterally Breasts: normal appearance, no masses or tenderness, No nipple retraction or dimpling, No nipple discharge or bleeding, No axillary adenopathy Heart: regular rate and rhythm Abdomen: soft, non-tender; no masses, no organomegaly Extremities: extremities normal, atraumatic, no cyanosis or edema Skin: skin color, texture, turgor normal. No rashes or lesions Lymph nodes: cervical, supraclavicular, and axillary nodes normal. Neurologic: grossly normal  Pelvic: External genitalia:  no lesions              No abnormal inguinal nodes palpated.              Urethra:  normal appearing urethra with no masses, tenderness or lesions              Bartholins and Skenes: normal                 Vagina: normal appearing vagina with normal color and discharge, no lesions              Cervix: no lesions              Pap taken: {yes no:314532} Bimanual Exam:  Uterus:  normal size, contour, position, consistency, mobility, non-tender              Adnexa: no mass, fullness, tenderness              Rectal exam: {yes no:314532}.  Confirms.              Anus:  normal  sphincter tone, no lesions  Chaperone was present for exam:  {BSCHAPERONE:31226::"Laconya Clere F, CMA"}  ASSESSMENT: Encounter for breast and pelvic exam.   ***  PLAN: Mammogram screening discussed. Self breast awareness reviewed. Pap and HRV collected:  {yes no:314532} Guidelines for Calcium, Vitamin D, regular exercise program including cardiovascular and weight bearing exercise. Medication refills:  *** {LABS (Optional):23779} Follow up:  ***    Additional counseling given.  {yes [id number]. ***  total time was spent for this patient encounter, including preparation, face-to-face counseling with the patient, coordination of care, and documentation of the encounter in addition to doing the breast and pelvic exam.

## 2023-03-05 NOTE — Assessment & Plan Note (Signed)
Patient presents for surgical consultation for postmenopausal bleeding with endometrial mass on transvaginal ultrasound, 02/06/2023. Patient has a history of endometrial polyps and submucosal fibroids, status post operative hysteroscopy in 2023. Recommend operative hysteroscopy with resection of endometrial mass, thought to be 3 cm polyp. Patient had an endometrial biopsies November 2024 & January 2025 with scant endometrial samples. Briefly discussed hysterectomy as patient has had a prior hysteroscopy, however after risk and benefits discussions the surgical risk were felt to outweigh the benefits.  Plan for operative hysteroscopy, polypectomy, endometrial sampling, dilation and curettage.  Discussed outpatient procedure. Reviewed that  recovery is usually 1-2 days. Risks including infections, bleeding, and damage to surrounding organs reviewed. Recommend NPO prior to midnight and reviewed medication to take on day of surgery. Dicussed use of NSAIDS as needed for pain postoperatively.  Preop checklist: Antibiotics: none DVT ppx: SCDs Postop visit: 1 week Additional clearance: medical, cardiac

## 2023-03-08 ENCOUNTER — Telehealth: Payer: Self-pay | Admitting: *Deleted

## 2023-03-08 ENCOUNTER — Encounter: Payer: Self-pay | Admitting: Obstetrics and Gynecology

## 2023-03-08 NOTE — Telephone Encounter (Signed)
   Pre-operative Risk Assessment    Patient Name: Kerry [name]  DOB: 15-Jul-1950 MRN: [id number]   Date of last office visit: 12/26/22 DR. ROSS Date of next office visit: 04/17/23 DR. ROSS   Request for Surgical Clearance    Procedure:   DIAGNOSTIC  HYSTEROSCOPY, POLYPECTOMY, D&C  Date of Surgery:  Clearance TBD ASAP                                Surgeon:  DR. Vertell Novak Group or Practice Name:  [facility] [place] Phone number:  [phone] Fax number:  [phone]; ATTN: Carmelina Dane, RN BSN   Type of Clearance Requested:   - Medical  - Pharmacy:  Hold Aspirin     Type of Anesthesia:  Local  STANDBY ANESTHSIA   Additional requests/questions:    Kerry [name]   03/08/2023, 6:17 PM

## 2023-03-08 NOTE — Telephone Encounter (Signed)
See surgery referral, pending benefits, and surgical clearance.

## 2023-03-09 NOTE — Telephone Encounter (Signed)
 Tried calling patient no answer left a detailed message to call back

## 2023-03-09 NOTE — Telephone Encounter (Signed)
   Name: Kerry [name]  DOB: [birth year]  MRN: [id number]  Primary Cardiologist: Dietrich Pates, MD  Chart reviewed as part of pre-operative protocol coverage. Because of [name]'s past medical history and time since last visit, she will require a follow-up telephone visit in order to better assess preoperative cardiovascular risk.  Pre-op covering staff: - Please schedule appointment and call patient to inform them. If patient already had an upcoming appointment within acceptable timeframe, please add "pre-op clearance" to the appointment notes so provider is aware. - Please contact requesting surgeon's office via preferred method (i.e, phone, fax) to inform them of need for appointment prior to surgery.  She had stent placement over a year ago.  On aspirin alone.  She can hold her aspirin for 5 to 7 days as long as she is not having cardiac symptoms at the time of phone call.  Please resume when medically safe to do so.  Patient lives in [place].  Sharlene Dory, PA-C  03/09/2023, 8:07 AM

## 2023-03-09 NOTE — Telephone Encounter (Signed)
Patient returned our call and has been scheduled for a in person office visit patient preferred to come in for a sooner appt

## 2023-03-10 DIAGNOSIS — L821 Other seborrheic keratosis: Secondary | ICD-10-CM | POA: Diagnosis not present

## 2023-03-10 DIAGNOSIS — D2239 Melanocytic nevi of other parts of face: Secondary | ICD-10-CM | POA: Diagnosis not present

## 2023-03-10 DIAGNOSIS — L814 Other melanin hyperpigmentation: Secondary | ICD-10-CM | POA: Diagnosis not present

## 2023-03-10 DIAGNOSIS — L578 Other skin changes due to chronic exposure to nonionizing radiation: Secondary | ICD-10-CM | POA: Diagnosis not present

## 2023-03-12 DIAGNOSIS — I2510 Atherosclerotic heart disease of native coronary artery without angina pectoris: Secondary | ICD-10-CM | POA: Diagnosis not present

## 2023-03-12 DIAGNOSIS — E785 Hyperlipidemia, unspecified: Secondary | ICD-10-CM | POA: Diagnosis not present

## 2023-03-12 NOTE — Progress Notes (Unsigned)
 Cardiology Clinic Note   Patient Name: Kerry [name] Date of Encounter: 03/14/2023  Primary Care Provider:  Street, [name], MD Primary Cardiologist:  Dietrich Pates, MD  Patient Profile    Kerry [name] 73 year old female presents the clinic today for follow-up evaluation of her coronary artery disease and preoperative cardiac evaluation.  Past Medical History    Past Medical History:  Diagnosis Date   Abnormal uterine bleeding    Allergic rhinitis, cause unspecified    Arthritis of knee, right    Bursitis of left shoulder    Cherry hemangioma 07/28/2015   vulva   Chronic pain    Coronary artery disease    Dyspareunia    Elevated cholesterol    Elevated uric acid in blood 2021   Fibroid    Foot fracture, left 01/2015   and torn tendons   GERD (gastroesophageal reflux disease)    HTN (hypertension)    Interstitial lung disease (HCC)    Kidney stones    Sleep apnea    Urinary incontinence    Past Surgical History:  Procedure Laterality Date    dilatation and curettage  2000   CARPAL TUNNEL RELEASE  1998   CARPAL TUNNEL RELEASE Left 05/21/2020   CATARACT EXTRACTION Bilateral    CORONARY STENT INTERVENTION N/A 01/26/2022   Procedure: CORONARY STENT INTERVENTION;  Surgeon: Kathleene Hazel, MD;  Location: [facility];  Service: Cardiovascular;  Laterality: N/A;   dental implants  2010   DENTAL SURGERY     DILATATION & CURETTAGE/HYSTEROSCOPY WITH MYOSURE N/A 08/22/2021   Procedure: DILATATION & CURETTAGE/HYSTEROSCOPY WITH MYOSURE RESECTION OF ENDOMETRIAL MASS;  Surgeon: Patton Salles, MD;  Location: [facility];  Service: Gynecology;  Laterality: N/A;   ESOPHAGEAL DILATION  2010   HAMMER TOE SURGERY     rt   LEFT HEART CATH AND CORONARY ANGIOGRAPHY N/A 01/25/2022   Procedure: LEFT HEART CATH AND CORONARY ANGIOGRAPHY;  Surgeon: Lennette Bihari, MD;  Location: [facility];  Service: Cardiovascular;   Laterality: N/A;   OPERATIVE ULTRASOUND N/A 08/22/2021   Procedure: OPERATIVE ULTRASOUND GUIDED HYSTEROSCOPY;  Surgeon: Patton Salles, MD;  Location: [facility];  Service: Gynecology;  Laterality: N/A;   PELVIC LAPAROSCOPY  1990   ROTATOR CUFF REPAIR Right 09/2011   Dr. Ave Filter   ROTATOR CUFF REPAIR Left 11/11/2013   Dr. Ave Filter    SEPTOPLASTY     UMBILICAL HERNIA REPAIR  2010   Patient reports mesh was used for the repair.    Allergies  Allergies  Allergen Reactions   Meloxicam Other (See Comments)    Made patient have stomach pain ,sore throat, joint pains, face flush    Molds & Smuts Other (See Comments), Palpitations, Shortness Of Breath and Swelling   Cefixime Swelling   Celebrex [Celecoxib] Swelling   Cobalt Other (See Comments)    Per allergist   Gabapentin Other (See Comments)    Joint pain   Levofloxacin Other (See Comments)    Muscle and joint pain   Molybdenum Other (See Comments)    Per allergist   Other Other (See Comments)    TEQUIN. TEQUIN - JOINT AND MUSCLE PAIN Other Reaction: painful joints TEQUIN. TEQUIN - JOINT AND MUSCLE PAIN Tantal Metal   Penicillins Hives   Robaxin [Methocarbamol]    Statins Other (See Comments)    Muscle pain   Tomato Hives   Adhesive [Tape] Rash  Sensitive to EKG leads; sensitive to 10M Micropore tape   Dust Mite Extract Itching, Other (See Comments) and Palpitations   Tetracycline Hcl Rash    Can take Doxy   Tetracyclines & Related Rash    History of Present Illness    Dandria Meta Kroenke has a PMH of HTN, HLD, dyspnea, CAD with coronary calcium score 12/23.  She was noted to have a score of 1051.  She was started on statin and aspirin.  Cardiac PET/CT stress test 1/24 showed multiple perfusion defects.  She underwent cardiac catheterization 1/24 which showed multivessel coronary disease.  She underwent PCI with DES to her OM1 and PTCA to her circumflex and OM 2.  A platinum stent was used  due to concern for stent allergy.  She was placed on dual antiplatelet therapy.  She was seen in follow-up by Robin Searing, NP 1/24.  She continued to note some shortness of breath post procedure.  She was seen again in follow-up 3/24.  At that time she complained of shortness of breath and fatigue.  She was seen again in follow-up by Dr. Tenny Craw Summer 24 and fall 24.  Statin therapy was stopped.  She felt better.  She was seen by clinical pharmacist and was working on being placed on Repatha.  She followed up with Dr. Tenny Craw 12/26/2022.  During that time she did note some dyspnea on exertion.  She denied chest pain, lower extremity swelling, and PND.  She presents to the clinic today for follow-up evaluation and preoperative cardiac evaluation.  She states she is doing better than she was in December.  She does note that she has not returned to her baseline physical activity due to different health issues that have come up.  She was unable to do cardiac rehab due to the size of the machines and her stature.  She did do water physical therapy for some time in [place] which she did well with.  Her EKG today shows normal sinus rhythm 89 bpm.  Her blood pressure initially is 142/84 and on recheck is 134/80.  We reviewed her angiography.  She and her husband expressed understanding.  I will continue her current medication therapy and keep follow-up as scheduled with Dr. Tenny Craw.  Today she denies chest pain, shortness of breath, lower extremity edema, fatigue, palpitations, melena, hematuria, hemoptysis, diaphoresis, weakness, presyncope, syncope, orthopnea, and PND.      Home Medications    Prior to Admission medications   Medication Sig Start Date End Date Taking? Authorizing Provider  amLODipine (NORVASC) 5 MG tablet Take 1 tablet (5 mg total) by mouth daily. 07/19/22   Pricilla Riffle, MD  ascorbic acid (VITAMIN C) 250 MG tablet Take 250 mg by mouth daily.    [provider]  aspirin EC 81 MG  tablet Take 1 tablet (81 mg total) by mouth daily. Swallow whole. 01/03/22   Pricilla Riffle, MD  carboxymethylcellulose (REFRESH PLUS) 0.5 % SOLN 1 drop 2 (two) times daily as needed (for dry eyes).    [provider]  CINNAMON PO Take 2.5 mLs by mouth daily. Patient not taking: Reported on 02/28/2023    [provider]  Coenzyme Q10 200 MG capsule Take 200 mg by mouth daily.    [provider]  cyanocobalamin (VITAMIN B12) 1000 MCG tablet Take 1,000 mcg by mouth every Monday, Wednesday, and Friday.    [provider]  cyanocobalamin (VITAMIN B12) 1000 MCG/ML injection Inject 1,000 mcg into the muscle once.  Once a month    [provider]  Evolocumab (REPATHA SURECLICK) 140 MG/ML SOAJ Inject 140 mg into the skin every 14 (fourteen) days. 12/28/22   Pricilla Riffle, MD  FIBER PO Take by mouth.    [provider]  furosemide (LASIX) 40 MG tablet Take 1 tablet (40 mg total) by mouth every other day. 12/26/22   Pricilla Riffle, MD  metoprolol succinate (TOPROL-XL) 25 MG 24 hr tablet Take 25 mg by mouth daily. 10/03/22   [provider]  MISC NATURAL PRODUCTS PO Take by mouth. Black currant seed oil Patient not taking: Reported on 02/28/2023    [provider]  Multiple Vitamins-Minerals (SUPER MULTI-VITAMIN PO) Take 1 capsule by mouth daily. Solgar Formula VM-75    [provider]  nitroGLYCERIN (NITROSTAT) 0.4 MG SL tablet Place 1 tablet (0.4 mg total) under the tongue every 5 (five) minutes x 3 doses as needed for chest pain. 01/27/22   Jonita Albee, PA-C  NON FORMULARY Take 3 fluid ounces by mouth daily. Goat Kefir    [provider]  Omega-3 Fatty Acids (FISH OIL) 500 MG CAPS Take 500 mg by mouth daily.    [provider]  potassium chloride SA (KLOR-CON M) 20 MEQ tablet Take 1 tablet (20 mEq total) by mouth every other day. 12/26/22   Pricilla Riffle, MD  Probiotic Product (PROBIOTIC ADVANCED PO) Take by  mouth. Patient states taking 5 billion, one capsule daily. Suprema Dophilus probiotic    [provider]  Sodium Chloride-Xylitol (XLEAR SINUS CARE SPRAY NA) Place into the nose. 2 sprays at bedtime    [provider]  TART CHERRY PO Take 1 tablet by mouth daily.    [provider]  Turmeric (QC TUMERIC COMPLEX PO) Take by mouth.    [provider]  VITAMIN D-VITAMIN K PO Take 1 capsule by mouth every other day. Vit d 5000 units and of K    [provider]    Family History    Family History  Problem Relation Age of Onset   Rheumatologic disease Mother    Diabetes Mother    Heart attack Father    Basal cell carcinoma Father    Rheumatologic disease Sister    Arthritis Sister        --Rheumatoid arthritis   Ovarian cancer Sister 47   Crohn's disease Brother    Kidney failure Brother    Ulcerative colitis Brother    Glaucoma Brother    Cancer Niece 88       gyn   She indicated that her mother is deceased. She indicated that her father is deceased. She indicated that her sister is alive. She indicated that only one of her two brothers is alive. She indicated that her niece is alive.  Social History    Social History   Socioeconomic History   Marital status: Married    Spouse name: Not on file   Number of children: Not on file   Years of education: Not on file   Highest education level: Not on file  Occupational History   Occupation: Retired  Tobacco Use   Smoking status: Never   Smokeless tobacco: Never  Vaping Use   Vaping status: Never Used  Substance and Sexual Activity   Alcohol use: No    Alcohol/week: 0.0 standard drinks of alcohol   Drug use: No   Sexual activity: Not Currently    Partners: Male  Birth control/protection: Post-menopausal  Other Topics Concern   Not on file  Social History Narrative   Are you right handed or left handed? Right handed   Are you currently employed ? No retired   What is  your current occupation?   Do you live at home alone? No   Who lives with you? Lives with husband.    What type of home do you live in: 1 story or 2 story? Two story home.       Social Drivers of Corporate investment banker Strain: Not on file  Food Insecurity: Low Risk  (02/07/2023)   Received from [facility]   Hunger Vital Sign    Worried About Running Out of Food in the Last Year: Never true    Ran Out of Food in the Last Year: Never true  Transportation Needs: No Transportation Needs (02/07/2023)   Received from [facility]    In the past 12 months, has lack of reliable transportation kept you from medical appointments, meetings, work or from getting things needed for daily living? : No  Physical Activity: Not on file  Stress: Not on file  Social Connections: Not on file  Intimate Partner Violence: Not At Risk (01/25/2022)   Humiliation, Afraid, Rape, and Kick questionnaire    Fear of Current or Ex-Partner: No    Emotionally Abused: No    Physically Abused: No    Sexually Abused: No     Review of Systems    General:  No chills, fever, night sweats or weight changes.  Cardiovascular:  No chest pain, dyspnea on exertion, edema, orthopnea, palpitations, paroxysmal nocturnal dyspnea. Dermatological: No rash, lesions/masses Respiratory: No cough, dyspnea Urologic: No hematuria, dysuria Abdominal:   No nausea, vomiting, diarrhea, bright red blood per rectum, melena, or hematemesis Neurologic:  No visual changes, wkns, changes in mental status. All other systems reviewed and are otherwise negative except as noted above.  Physical Exam    VS:  BP 134/80   Pulse 89   Ht 5' (1.524 m)   Wt 236 lb 9.6 oz (107.3 kg)   LMP 08/16/1992   SpO2 96%   BMI 46.21 kg/m  , BMI Body mass index is 46.21 kg/m. GEN: Well nourished, well developed, in no acute distress. HEENT: normal. Neck: Supple, no JVD, carotid bruits, or masses. Cardiac: RRR, no murmurs, rubs, or  gallops. No clubbing, cyanosis, edema.  Radials/DP/PT 2+ and equal bilaterally.  Respiratory:  Respirations regular and unlabored, clear to auscultation bilaterally. GI: Soft, nontender, nondistended, BS + x 4. MS: no deformity or atrophy. Skin: warm and dry, no rash. Neuro:  Strength and sensation are intact. Psych: Normal affect.  Accessory Clinical Findings    Recent Labs: 11/23/2022: ALT 17 01/03/2023: BUN 16; Creatinine, Ser 0.81; NT-Pro BNP 160; Potassium 4.1; Sodium 139   Recent Lipid Panel    Component Value Date/Time   CHOL 134 03/12/2023 1049   TRIG 153 (H) 03/12/2023 1049   HDL 50 03/12/2023 1049   CHOLHDL 2.7 03/12/2023 1049   CHOLHDL 5.6 02/09/2011 1027   VLDL 27 02/09/2011 1027   LDLCALC 58 03/12/2023 1049         ECG personally reviewed by me today- EKG Interpretation Date/Time:  Wednesday March 14 2023 09:04:12 EST Ventricular Rate:  89 PR Interval:  184 QRS Duration:  92 QT Interval:  350 QTC Calculation: 425 R Axis:   -21  Text Interpretation: Normal sinus rhythm Minimal voltage criteria for  LVH, may be normal variant ( Cornell product ) Septal infarct , age undetermined Confirmed by Edd Fabian [phone]) on 03/14/2023 9:09:23 AM   Echocardiogram 04/08/2021  IMPRESSIONS     1. Left ventricular ejection fraction, by estimation, is 60 to 65%. The  left ventricle has normal function. The left ventricle has no regional  wall motion abnormalities. There is moderate left ventricular hypertrophy.  Left ventricular diastolic  parameters are indeterminate.   2. Right ventricular systolic function is normal. The right ventricular  size is normal. Tricuspid regurgitation signal is inadequate for assessing  PA pressure.   3. Left atrial size was mildly dilated.   4. No evidence of mitral valve regurgitation. Moderate mitral annular  calcification.   5. Aortic valve regurgitation is not visualized. Aortic valve  sclerosis/calcification is present,  without any evidence of aortic  stenosis.   6. The inferior vena cava is normal in size with greater than 50%  respiratory variability, suggesting right atrial pressure of 3 mmHg.   Comparison(s): No significant change from prior study.   FINDINGS   Left Ventricle: Left ventricular ejection fraction, by estimation, is 60  to 65%. The left ventricle has normal function. The left ventricle has no  regional wall motion abnormalities. The left ventricular internal cavity  size was normal in size. There is   moderate left ventricular hypertrophy. Left ventricular diastolic  parameters are indeterminate.   Right Ventricle: The right ventricular size is normal. No increase in  right ventricular wall thickness. Right ventricular systolic function is  normal. Tricuspid regurgitation signal is inadequate for assessing PA  pressure.   Left Atrium: Left atrial size was mildly dilated.   Right Atrium: Right atrial size was normal in size.   Pericardium: There is no evidence of pericardial effusion.   Mitral Valve: Moderate mitral annular calcification. No evidence of mitral  valve regurgitation.   Tricuspid Valve: The tricuspid valve is normal in structure. Tricuspid  valve regurgitation is not demonstrated.   Aortic Valve: Aortic valve regurgitation is not visualized. Aortic  regurgitation PHT measures 429 msec. Aortic valve sclerosis/calcification  is present, without any evidence of aortic stenosis. Aortic valve mean  gradient measures 11.0 mmHg. Aortic valve  peak gradient measures 20.9 mmHg. Aortic valve area, by VTI measures 1.46  cm.   Pulmonic Valve: The pulmonic valve was normal in structure. Pulmonic valve  regurgitation is not visualized.   Aorta: The aortic root and ascending aorta are structurally normal, with  no evidence of dilitation.   Venous: The inferior vena cava is normal in size with greater than 50%  respiratory variability, suggesting right atrial pressure of  3 mmHg.   IAS/Shunts: No atrial level shunt detected by color flow Doppler.    LHC 01/26/2022    Mid Cx lesion is 80% stenosed.   Prox Cx lesion is 20% stenosed.   2nd Diag lesion is 60% stenosed.   3rd Mrg lesion is 90% stenosed.   A drug-eluting stent was successfully placed using a SYNERGY XD 2.25X20.   Balloon angioplasty was performed using a BALLN SAPPHIRE 2.0X12.   Post intervention, there is a 0% residual stenosis.   Post intervention, there is a 60% residual stenosis.   Severe stenosis in the mid Circumflex affecting flow into the first obtuse marginal branch and the second obtuse marginal branch.  Successful PTCA/DES x 1 OM1 (Synergy stent platform as felt to be the most favorable given her metal allergy profile) Successful balloon angioplasty mid Circumflex/OM2  Recommendations: DAPT with ASA and Plavix for at least six months.  Diagnostic Dominance: Right  Intervention         Assessment & Plan   1.  Coronary artery disease-denies recent episodes of angina and anginal type symptoms.  Underwent LHC with PCI and PTCA 1/24. Continue amlodipine, aspirin, Repatha, metoprolol, omega-3 fatty acids Heart healthy low-sodium diet  Essential hypertension-BP today 134/80. Maintain blood pressure log Continue amlodipine, metoprolol  Hyperlipidemia-LDL 58 on 03/12/23.  Statin intolerant.  Reviewed recent lab work.  Significant improvement. High-fiber diet Increase physical activity as tolerated Continue aspirin, Repatha, omega-3 fatty acids, turmeric  Preoperative cardiac evaluation- DIAGNOSTIC  HYSTEROSCOPY, POLYPECTOMY, D&C   Date of Surgery:  Clearance TBD ASAP                                  Surgeon:  DR. Vertell Novak Group or Practice Name:  [facility] [place] Phone number:  [phone] Fax number:  [phone]; ATTN: Carmelina Dane, RN BSN       Primary Cardiologist: Dietrich Pates, MD  Chart reviewed as part of pre-operative  protocol coverage. Given past medical history and time since last visit, based on ACC/AHA guidelines, Mikailah Morel would be at acceptable risk for the planned procedure without further cardiovascular testing.   Her RCRI is moderate risk, 6.6% risk of major cardiac event.  She is able to complete greater than 4 METS of physical activity.  Patient was advised that if she develops new symptoms prior to surgery to contact our office to arrange a follow-up appointment.  He verbalized understanding.   Disposition: Follow-up with Dr. Tenny Craw as scheduled       Thomasene Ripple. Janey Petron NP-C     03/14/2023, 9:56 AM [facility] [address] Office [phone] Fax [phone]    I spent 14 minutes examining this patient, reviewing medications, and using patient centered shared decision making involving their cardiac care.   I spent  20 minutes reviewing past medical history,  medications, and prior cardiac tests.

## 2023-03-13 ENCOUNTER — Encounter: Payer: Self-pay | Admitting: Pharmacist

## 2023-03-13 LAB — LIPID PANEL
Chol/HDL Ratio: 2.7 {ratio} (ref 0.0–4.4)
Cholesterol, Total: 134 mg/dL (ref 100–199)
HDL: 50 mg/dL (ref >39)
LDL Chol Calc (NIH): 58 mg/dL (ref 0–99)
Triglycerides: 153 mg/dL — ABNORMAL HIGH (ref 0–149)
VLDL Cholesterol Cal: 26 mg/dL (ref 5–40)

## 2023-03-13 LAB — APOLIPOPROTEIN B: Apolipoprotein B: 69 mg/dL (ref <90)

## 2023-03-14 ENCOUNTER — Encounter: Payer: Self-pay | Admitting: General Practice

## 2023-03-14 ENCOUNTER — Ambulatory Visit: Payer: Medicare Other | Attending: General Practice | Admitting: General Practice

## 2023-03-14 VITALS — BP 134/80 | HR 89 | Ht 60.0 in | Wt 236.6 lb

## 2023-03-14 DIAGNOSIS — I10 Essential (primary) hypertension: Secondary | ICD-10-CM | POA: Insufficient documentation

## 2023-03-14 DIAGNOSIS — Z01810 Encounter for preprocedural cardiovascular examination: Secondary | ICD-10-CM | POA: Diagnosis not present

## 2023-03-14 DIAGNOSIS — E785 Hyperlipidemia, unspecified: Secondary | ICD-10-CM | POA: Insufficient documentation

## 2023-03-14 DIAGNOSIS — I2510 Atherosclerotic heart disease of native coronary artery without angina pectoris: Secondary | ICD-10-CM | POA: Diagnosis not present

## 2023-03-14 NOTE — Patient Instructions (Signed)
 Medication Instructions:  The current medical regimen is effective;  continue present plan and medications as directed. Please refer to the Current Medication list given to you today.  *If you need a refill on your cardiac medications before your next appointment, please call your pharmacy*  Lab Work: NONE  Other Instructions Ok for scheduled surgery  Follow-Up: At [facility], you and your health needs are our priority.  As part of our continuing mission to provide you with exceptional heart care, we have created designated Provider Care Teams.  These Care Teams include your primary Cardiologist (physician) and Advanced Practice Providers (APPs -  Physician Assistants and Nurse Practitioners) who all work together to provide you with the care you need, when you need it.  Your next appointment:   AS SCHEDULED   Provider:   Dietrich Pates, MD

## 2023-03-16 ENCOUNTER — Encounter (HOSPITAL_COMMUNITY): Payer: Self-pay | Admitting: Obstetrics and Gynecology

## 2023-03-16 NOTE — Telephone Encounter (Signed)
 See surgery referral. Call placed to patient. Left message to call Noreene Larsson, RN at [facility], [phone], option 5.

## 2023-03-19 ENCOUNTER — Encounter (HOSPITAL_COMMUNITY): Payer: Self-pay | Admitting: Obstetrics and Gynecology

## 2023-03-19 ENCOUNTER — Encounter: Payer: Medicare Other | Admitting: Obstetrics and Gynecology

## 2023-03-19 ENCOUNTER — Telehealth: Payer: Self-pay | Admitting: Genetic Counselor

## 2023-03-20 ENCOUNTER — Encounter (HOSPITAL_COMMUNITY): Payer: Self-pay | Admitting: Obstetrics and Gynecology

## 2023-03-20 NOTE — Anesthesia Preprocedure Evaluation (Signed)
 Anesthesia Evaluation  Patient identified by MRN, date of birth, ID band Patient awake    Reviewed: Allergy & Precautions, NPO status , Patient's Chart, lab work & pertinent test results  Airway Mallampati: I  TM Distance: >3 FB Neck ROM: Full    Dental no notable dental hx. (+) Edentulous Upper, Edentulous Lower   Pulmonary sleep apnea and Continuous Positive Airway Pressure Ventilation    Pulmonary exam normal breath sounds clear to auscultation       Cardiovascular hypertension, Pt. on medications (-) angina + CAD  (-) Past MI Normal cardiovascular exam Rhythm:Regular Rate:Normal  2023 echo 1. Left ventricular ejection fraction, by estimation, is 60 to 65%. The  left ventricle has normal function. The left ventricle has no regional  wall motion abnormalities. There is moderate left ventricular hypertrophy.  Left ventricular diastolic  parameters are indeterminate.   2. Right ventricular systolic function is normal. The right ventricular  size is normal. Tricuspid regurgitation signal is inadequate for assessing  PA pressure.   3. Left atrial size was mildly dilated.   4. No evidence of mitral valve regurgitation. Moderate mitral annular  calcification.   5. Aortic valve regurgitation is not visualized. Aortic valve  sclerosis/calcification is present, without any evidence of aortic  stenosis.     Neuro/Psych    GI/Hepatic ,GERD  Medicated and Controlled,,  Endo/Other    Class 3 obesity  Renal/GU      Musculoskeletal  (+) Arthritis ,    Abdominal   Peds  Hematology Lab Results      Component                Value               Date                           HGB                      12.6                01/27/2022                HCT                      37.1                01/27/2022                  PLT                      203                 01/27/2022              Anesthesia Other Findings All: see  list  Reproductive/Obstetrics                             Anesthesia Physical Anesthesia Plan  ASA: 3  Anesthesia Plan: General   Post-op Pain Management: Tylenol PO (pre-op)*   Induction: Intravenous  PONV Risk Score and Plan: 4 or greater and Treatment may vary due to age or medical condition and Ondansetron  Airway Management Planned: LMA  Additional Equipment: None  Intra-op Plan:   Post-operative Plan: Extubation in OR  Informed Consent:      Dental advisory  given  Plan Discussed with: CRNA and Surgeon  Anesthesia Plan Comments: (Proseal LMA)       Anesthesia Quick Evaluation

## 2023-03-20 NOTE — Progress Notes (Signed)
 Spoke w/ via phone for pre-op interview--- pt Lab needs dos----  [facility] results------ current EKG in epic/ chart COVID test -----patient states asymptomatic no test needed Arrive at ------- 1215 on 03-21-2023 NPO after MN NO Solid Food.  Clear liquids from MN until--- 1115 Pre-Surgery Ensure or G2: n/a  Med rec completed Medications to take morning of surgery ----- norvasc, toprol Diabetic medication ----- n/a  GLP1 agonist last dose: n/a GLP1 instructions:  Patient instructed no nail polish to be worn day of surgery Patient instructed to bring photo id and insurance card day of surgery Patient aware to have Driver (ride ) / caregiver    for 24 hours after surgery - husband, michael Patient Special Instructions -----  asked pt to bring cpap/ mask/ tubing leave in car if needed by anesthesia in recovery nurse will call family to get from car Pre-Op special Instructions -----  pt stated last visit at [facility] 08-22-2021 she has a very hard to time getting up on table / stretcher due to no stool available.  Also, in pre-op was given compression hose told she had put on herself without assistance,  this was very hard for patient due to her pulmonary status when she exerts herself gets very sob.  Patient verbalized understanding of instructions that were given at this phone interview. Patient denies chest pain, sob, fever, cough at the interview.    Anesthesia Review:  HTN;  multivessel CAD ~ 01/ 2024 s/p PTCA to mCx and OM2 & PTCA DES x1 to OM1 ;  covid related mild ILD w/ UIP pattern w/ DOE;  moderate OSA w/ cpap;  pre-diabetic;  CPS Pt denies cardiac s&s, does have sob w/ exertion as baseline,  and no peripheral swelling.  Pt stated has never used nitroglycerin since having prescription.  Pt uses cpap nightly.   PCP:  Dr Salena Saner. Street (lov 12-15-2022)   PCP medical clearance w/ chart dated 03-09-2023 Cardiologist :  Dr Demetrius Charity. Ross -- Office Pre-Op Cardiac Clearance Visit dated 03-14-2023,  epic/ chart Pulmonologist:  Dr Craige Cotta Theron Arista 02-07-2023,  printed in chart,  in note Dr Craige Cotta stated no pulmonary restrictions for surgery.   Chest x-ray :  02-07-2023/  HR CT chest 03-09-2022 EKG : 03-14-2023 Echo : 04-08-2021 Stress test:  PET CTC NUC 01-24-2022 Cardiac Cath :   01-25-2022/  01-26-2022  Activity level:   DOE/  can do household chores w/ breaks Sleep Study/ CPAP :  in epic [facility]ST 03-04-2015  (followed by Dr Craige Cotta) Fasting Blood Sugar :/ Checks Blood Sugar -- times a day:  dose not check  Blood Thinner/ Instructions /Last Dose:  no ASA / Instructions/ Last Dose :   ASA 81 mg/  pt stated last dose 03-16-2023 , stated was given instructions from cardiology could stop and when to stop prior to surgery

## 2023-03-21 ENCOUNTER — Ambulatory Visit (HOSPITAL_COMMUNITY): Payer: Self-pay | Admitting: Anesthesiology

## 2023-03-21 ENCOUNTER — Other Ambulatory Visit: Payer: Self-pay

## 2023-03-21 ENCOUNTER — Ambulatory Visit (HOSPITAL_BASED_OUTPATIENT_CLINIC_OR_DEPARTMENT_OTHER): Payer: Self-pay | Admitting: Anesthesiology

## 2023-03-21 ENCOUNTER — Encounter (HOSPITAL_COMMUNITY)
Admission: RE | Disposition: A | Discharge status: Home / Self Care | Payer: Self-pay | Source: Home / Self Care | Attending: Obstetrics and Gynecology

## 2023-03-21 ENCOUNTER — Encounter (HOSPITAL_COMMUNITY): Payer: Self-pay | Admitting: Obstetrics and Gynecology

## 2023-03-21 ENCOUNTER — Ambulatory Visit (HOSPITAL_COMMUNITY)
Admission: RE | Admit: 2023-03-21 | Discharge: 2023-03-21 | Disposition: A | Discharge status: Home / Self Care | Payer: Medicare Other | Attending: Obstetrics and Gynecology | Admitting: Obstetrics and Gynecology

## 2023-03-21 DIAGNOSIS — N858 Other specified noninflammatory disorders of uterus: Secondary | ICD-10-CM

## 2023-03-21 DIAGNOSIS — C541 Malignant neoplasm of endometrium: Secondary | ICD-10-CM | POA: Diagnosis not present

## 2023-03-21 DIAGNOSIS — I2510 Atherosclerotic heart disease of native coronary artery without angina pectoris: Secondary | ICD-10-CM

## 2023-03-21 DIAGNOSIS — Z79899 Other long term (current) drug therapy: Secondary | ICD-10-CM | POA: Diagnosis not present

## 2023-03-21 DIAGNOSIS — N950 Postmenopausal bleeding: Secondary | ICD-10-CM | POA: Diagnosis not present

## 2023-03-21 DIAGNOSIS — R9389 Abnormal findings on diagnostic imaging of other specified body structures: Secondary | ICD-10-CM | POA: Diagnosis not present

## 2023-03-21 DIAGNOSIS — N9489 Other specified conditions associated with female genital organs and menstrual cycle: Secondary | ICD-10-CM

## 2023-03-21 DIAGNOSIS — E66813 Obesity, class 3: Secondary | ICD-10-CM | POA: Insufficient documentation

## 2023-03-21 DIAGNOSIS — Z8041 Family history of malignant neoplasm of ovary: Secondary | ICD-10-CM | POA: Insufficient documentation

## 2023-03-21 DIAGNOSIS — Z6841 Body Mass Index (BMI) 40.0 and over, adult: Secondary | ICD-10-CM | POA: Diagnosis not present

## 2023-03-21 DIAGNOSIS — I10 Essential (primary) hypertension: Secondary | ICD-10-CM | POA: Diagnosis not present

## 2023-03-21 DIAGNOSIS — N840 Polyp of corpus uteri: Secondary | ICD-10-CM | POA: Diagnosis not present

## 2023-03-21 DIAGNOSIS — G4730 Sleep apnea, unspecified: Secondary | ICD-10-CM | POA: Diagnosis not present

## 2023-03-21 DIAGNOSIS — Z955 Presence of coronary angioplasty implant and graft: Secondary | ICD-10-CM | POA: Diagnosis not present

## 2023-03-21 DIAGNOSIS — Z01818 Encounter for other preprocedural examination: Secondary | ICD-10-CM

## 2023-03-21 DIAGNOSIS — N8502 Endometrial intraepithelial neoplasia [EIN]: Secondary | ICD-10-CM | POA: Diagnosis not present

## 2023-03-21 DIAGNOSIS — G4733 Obstructive sleep apnea (adult) (pediatric): Secondary | ICD-10-CM

## 2023-03-21 HISTORY — DX: Personal history of adenomatous and serrated colon polyps: Z86.0101

## 2023-03-21 HISTORY — DX: Personal history of urinary calculi: Z87.442

## 2023-03-21 HISTORY — DX: Chronic pain syndrome: G89.4

## 2023-03-21 HISTORY — DX: Other nonspecific abnormal finding of lung field: R91.8

## 2023-03-21 HISTORY — DX: Complete loss of teeth, unspecified cause, unspecified class: K08.109

## 2023-03-21 HISTORY — PX: DILATATION & CURETTAGE/HYSTEROSCOPY WITH MYOSURE: SHX6511

## 2023-03-21 HISTORY — DX: Personal history of transient ischemic attack (TIA), and cerebral infarction without residual deficits: Z86.73

## 2023-03-21 HISTORY — DX: Segmental and somatic dysfunction of abdomen and other regions: M99.09

## 2023-03-21 HISTORY — DX: Family history of malignant neoplasm of ovary: Z80.41

## 2023-03-21 HISTORY — DX: Obstructive sleep apnea (adult) (pediatric): G47.33

## 2023-03-21 HISTORY — DX: Prediabetes: R73.03

## 2023-03-21 HISTORY — DX: Pain in unspecified joint: M25.50

## 2023-03-21 HISTORY — DX: Hyperlipidemia, unspecified: E78.5

## 2023-03-21 HISTORY — DX: Unspecified osteoarthritis, unspecified site: M19.90

## 2023-03-21 HISTORY — DX: Allergic contact dermatitis due to metals: L23.0

## 2023-03-21 HISTORY — DX: Personal history of other diseases of the digestive system: Z87.19

## 2023-03-21 HISTORY — DX: Overactive bladder: N32.81

## 2023-03-21 HISTORY — DX: Other specified conditions associated with female genital organs and menstrual cycle: N94.89

## 2023-03-21 HISTORY — DX: Leiomyoma of uterus, unspecified: D25.9

## 2023-03-21 HISTORY — DX: Diaphragmatic hernia without obstruction or gangrene: K44.9

## 2023-03-21 HISTORY — DX: Other complications of anesthesia, initial encounter: T88.59XA

## 2023-03-21 LAB — POCT I-STAT, CHEM 8
BUN: 17 mg/dL (ref 8–23)
Calcium, Ion: 1.21 mmol/L (ref 1.15–1.40)
Chloride: 105 mmol/L (ref 98–111)
Creatinine, Ser: 0.6 mg/dL (ref 0.44–1.00)
Glucose, Bld: 113 mg/dL — ABNORMAL HIGH (ref 70–99)
HCT: 43 % (ref 36.0–46.0)
Hemoglobin: 14.6 g/dL (ref 12.0–15.0)
Potassium: 3.8 mmol/L (ref 3.5–5.1)
Sodium: 142 mmol/L (ref 135–145)
TCO2: 24 mmol/L (ref 22–32)

## 2023-03-21 SURGERY — DILATATION & CURETTAGE/HYSTEROSCOPY WITH MYOSURE
Anesthesia: General | Site: Vagina

## 2023-03-21 MED ORDER — OXYCODONE HCL 5 MG PO TABS: 5.0000 mg | ORAL_TABLET | Freq: Once | ORAL | Status: DC | PRN

## 2023-03-21 MED ORDER — LACTATED RINGERS IV SOLN: INTRAVENOUS | Status: DC

## 2023-03-21 MED ORDER — DEXAMETHASONE SODIUM PHOSPHATE 10 MG/ML IJ SOLN
INTRAMUSCULAR | Status: DC | PRN
  Administered 2023-03-21: 5 mg via INTRAVENOUS

## 2023-03-21 MED ORDER — OXYCODONE HCL 5 MG/5ML PO SOLN: 5.0000 mg | Freq: Once | ORAL | Status: DC | PRN

## 2023-03-21 MED ORDER — CHLORHEXIDINE GLUCONATE 0.12 % MT SOLN
15.0000 mL | Freq: Once | OROMUCOSAL | Status: AC
  Administered 2023-03-21: 15 mL via OROMUCOSAL

## 2023-03-21 MED ORDER — LIDOCAINE HCL (PF) 1 % IJ SOLN
INTRAMUSCULAR | Status: DC | PRN
  Administered 2023-03-21: 10 mL

## 2023-03-21 MED ORDER — LIDOCAINE HCL 1 % IJ SOLN
INTRAMUSCULAR | Status: AC
  Filled 2023-03-21: qty 20

## 2023-03-21 MED ORDER — STERILE WATER FOR IRRIGATION IR SOLN
Status: DC | PRN
  Administered 2023-03-21: 1000 mL

## 2023-03-21 MED ORDER — PROPOFOL 500 MG/50ML IV EMUL
INTRAVENOUS | Status: DC | PRN
  Administered 2023-03-21: 50 ug/kg/min via INTRAVENOUS

## 2023-03-21 MED ORDER — ORAL CARE MOUTH RINSE: 15.0000 mL | Freq: Once | OROMUCOSAL | Status: AC

## 2023-03-21 MED ORDER — LIDOCAINE HCL (PF) 1 % IJ SOLN
INTRAMUSCULAR | Status: AC
  Filled 2023-03-21: qty 30

## 2023-03-21 MED ORDER — FENTANYL CITRATE (PF) 250 MCG/5ML IJ SOLN
INTRAMUSCULAR | Status: AC
  Filled 2023-03-21: qty 5

## 2023-03-21 MED ORDER — ONDANSETRON HCL 4 MG/2ML IJ SOLN
INTRAMUSCULAR | Status: DC | PRN
Start: 2023-03-21 — End: 2023-03-21
  Administered 2023-03-21: 4 mg via INTRAVENOUS

## 2023-03-21 MED ORDER — DEXMEDETOMIDINE HCL IN NACL 80 MCG/20ML IV SOLN
INTRAVENOUS | Status: DC | PRN
  Administered 2023-03-21: 8 ug via INTRAVENOUS

## 2023-03-21 MED ORDER — SODIUM CHLORIDE 0.9 % IR SOLN
Status: DC | PRN
  Administered 2023-03-21: 3000 mL

## 2023-03-21 MED ORDER — PROPOFOL 10 MG/ML IV BOLUS
INTRAVENOUS | Status: AC
  Filled 2023-03-21: qty 20

## 2023-03-21 MED ORDER — SILVER NITRATE-POT NITRATE 75-25 % EX MISC
CUTANEOUS | Status: DC | PRN
  Administered 2023-03-21: 2

## 2023-03-21 MED ORDER — FENTANYL CITRATE (PF) 250 MCG/5ML IJ SOLN
INTRAMUSCULAR | Status: DC | PRN
  Administered 2023-03-21 (×2): 50 ug via INTRAVENOUS

## 2023-03-21 MED ORDER — ACETAMINOPHEN 10 MG/ML IV SOLN: 1000.0000 mg | Freq: Once | INTRAVENOUS | Status: DC | PRN

## 2023-03-21 MED ORDER — ONDANSETRON HCL 4 MG/2ML IJ SOLN: 4.0000 mg | Freq: Once | INTRAMUSCULAR | Status: DC | PRN

## 2023-03-21 MED ORDER — ACETAMINOPHEN 500 MG PO TABS
ORAL_TABLET | ORAL | Status: AC
  Filled 2023-03-21: qty 2

## 2023-03-21 MED ORDER — MIDAZOLAM HCL 2 MG/2ML IJ SOLN
INTRAMUSCULAR | Status: AC
  Filled 2023-03-21: qty 2

## 2023-03-21 MED ORDER — PROPOFOL 10 MG/ML IV BOLUS
INTRAVENOUS | Status: DC | PRN
  Administered 2023-03-21: 50 mg via INTRAVENOUS
  Administered 2023-03-21: 150 mg via INTRAVENOUS

## 2023-03-21 MED ORDER — EPHEDRINE SULFATE-NACL 50-0.9 MG/10ML-% IV SOSY
PREFILLED_SYRINGE | INTRAVENOUS | Status: DC | PRN
  Administered 2023-03-21: 10 mg via INTRAVENOUS

## 2023-03-21 MED ORDER — LIDOCAINE 2% (20 MG/ML) 5 ML SYRINGE
INTRAMUSCULAR | Status: AC
  Filled 2023-03-21: qty 5

## 2023-03-21 MED ORDER — FENTANYL CITRATE (PF) 100 MCG/2ML IJ SOLN: 25.0000 ug | INTRAMUSCULAR | Status: DC | PRN

## 2023-03-21 MED ORDER — CHLORHEXIDINE GLUCONATE 0.12 % MT SOLN
OROMUCOSAL | Status: DC
Start: 2023-03-21 — End: 2023-03-21
  Filled 2023-03-21: qty 15

## 2023-03-21 MED ORDER — GLYCOPYRROLATE PF 0.2 MG/ML IJ SOSY
PREFILLED_SYRINGE | INTRAMUSCULAR | Status: DC | PRN
  Administered 2023-03-21 (×2): .1 mg via INTRAVENOUS

## 2023-03-21 MED ORDER — ACETAMINOPHEN 500 MG PO TABS
1000.0000 mg | ORAL_TABLET | ORAL | Status: AC
  Administered 2023-03-21: 1000 mg via ORAL

## 2023-03-21 SURGICAL SUPPLY — 19 items
DEVICE MYOSURE LITE (MISCELLANEOUS) IMPLANT
DEVICE MYOSURE REACH (MISCELLANEOUS) IMPLANT
DILATOR CANAL MILEX (MISCELLANEOUS) IMPLANT
GAUZE 4X4 16PLY ~~LOC~~+RFID DBL (SPONGE) IMPLANT
GLOVE BIO SURGEON STRL SZ7.5 (GLOVE) ×1 IMPLANT
GLOVE BIOGEL PI IND STRL 7.5 (GLOVE) ×1 IMPLANT
GOWN STRL REUS W/TWL XL LVL3 (GOWN DISPOSABLE) ×1 IMPLANT
HIBICLENS CHG 4% 4OZ BTL (MISCELLANEOUS) ×1 IMPLANT
IV NS IRRIG 3000ML ARTHROMATIC (IV SOLUTION) ×1 IMPLANT
KIT PROCEDURE FLUENT (KITS) ×1 IMPLANT
KIT TURNOVER KIT B (KITS) ×1 IMPLANT
MYOSURE XL FIBROID (MISCELLANEOUS) IMPLANT
PACK VAGINAL MINOR WOMEN LF (CUSTOM PROCEDURE TRAY) ×1 IMPLANT
PAD OB MATERNITY 11 LF (PERSONAL CARE ITEMS) ×1 IMPLANT
PAD PREP 24X48 CUFFED NSTRL (MISCELLANEOUS) ×1 IMPLANT
SEAL ROD LENS SCOPE MYOSURE (ABLATOR) ×1 IMPLANT
SYSTEM TISS REMOVAL MYOSURE XL (MISCELLANEOUS) IMPLANT
TOWEL OR 17X24 6PK STRL BLUE (TOWEL DISPOSABLE) ×1 IMPLANT
WATER STERILE IRR 1000ML POUR (IV SOLUTION) IMPLANT

## 2023-03-21 NOTE — Discharge Instructions (Addendum)
  Post Anesthesia Home Care Instructions  Activity: Get plenty of rest for the remainder of the day. A responsible individual must stay with you for 24 hours following the procedure.  For the next 24 hours, DO NOT: -Drive a car -Advertising copywriter -Drink alcoholic beverages -Take any medication unless instructed by your physician -Make any legal decisions or sign important papers.  Meals: Start with liquid foods such as gelatin or soup. Progress to regular foods as tolerated. Avoid greasy, spicy, heavy foods. If nausea and/or vomiting occur, drink only clear liquids until the nausea and/or vomiting subsides. Call your physician if vomiting continues.  Special Instructions/Symptoms: Your throat may feel dry or sore from the anesthesia or the breathing tube placed in your throat during surgery. If this causes discomfort, gargle with warm salt water. The discomfort should disappear within 24 hours.  If you had a scopolamine patch placed behind your ear for the management of post- operative nausea and/or vomiting:  1. The medication in the patch is effective for 72 hours, after which it should be removed.  Wrap patch in a tissue and discard in the trash. Wash hands thoroughly with soap and water. 2. You may remove the patch earlier than 72 hours if you experience unpleasant side effects which may include dry mouth, dizziness or visual disturbances. 3. Avoid touching the patch. Wash your hands with soap and water after contact with the patch.    No acetaminophen/Tylenol until after 7:15 pm today if needed.    D & C Home care Instructions:   Personal hygiene:  Used sanitary napkins for vaginal drainage not tampons. Shower or tub bathe the day after your procedure. No douching until bleeding stops. Always wipe from front to back after  Elimination.  Activity: Do not drive or operate any equipment today. The effects of the anesthesia are still present and drowsiness may result. Rest today, not  necessarily flat bed rest, just take it easy. You may resume your normal activity in one to 2 days.  Sexual activity: No intercourse for one week or as indicated by your physician  Diet: Eat a light diet as desired this evening. You may resume a regular diet tomorrow.  Return to work: One to 2 days.  General Expectations of your surgery: Vaginal bleeding should be no heavier than a normal period. Spotting may continue up to 10 days. Mild cramps may continue for a couple of days. You may have a regular period in 2-6 weeks.  Unexpected observations call your doctor if these occur: persistent or heavy bleeding. Severe abdominal cramping or pain. Elevation of temperature greater than 100F.

## 2023-03-21 NOTE — Interval H&P Note (Signed)
 Date of Initial H&P: 02/28/23  History reviewed, patient examined, no change in status, stable for surgery.

## 2023-03-21 NOTE — Anesthesia Procedure Notes (Signed)
 Procedure Name: LMA Insertion Date/Time: 03/21/2023 2:04 PM  Performed by: Caryn Bee A, CRNAPre-anesthesia Checklist: Patient identified, Emergency Drugs available, Suction available and Patient being monitored Patient Re-evaluated:Patient Re-evaluated prior to induction Oxygen Delivery Method: Circle System Utilized Preoxygenation: Pre-oxygenation with 100% oxygen Induction Type: IV induction Ventilation: Mask ventilation without difficulty LMA: LMA inserted LMA Size: 4.0 Number of attempts: 1 Airway Equipment and Method: Bite block Placement Confirmation: positive ETCO2 Tube secured with: Tape Dental Injury: Teeth and Oropharynx as per pre-operative assessment

## 2023-03-21 NOTE — Op Note (Signed)
 03/21/2023 Rivka Barbara Claudius Sis [id number]   OPERATIVE REPORT  Preop Diagnosis: Postmenopausal bleeding, endometrial mass  Post operative diagnosis: same  Procedure: Diagnostic hysteroscopy, dilation and curettage  Surgeon: Rosalyn Gess, MD Assistant: none  Anesthesia: MAC Fluids: please see anesthesia report Fluid deficit: 145cc  Complications: None  Findings: Normal cervix. Uterine cavity measuring 7cm with both ostia seen. Endometrial polyp present.  Estimated blood loss: Minimal  Specimens:  ID Type Source Tests Collected by Time Destination  1 : Endometrial Curettings Tissue PATH Gyn benign resection SURGICAL PATHOLOGY Rosalyn Gess, MD 03/21/2023 1428   2 : Endometrial Polyp Tissue PATH Gyn benign resection SURGICAL PATHOLOGY Rosalyn Gess, MD 03/21/2023 1429     Disposition of specimen: Pathology   Procedure: Patient was taken to the OR where she was placed in dorsal lithotomy in stirrups. She voided prior to transport. SCDs were in place.  The patient was prepped and draped in the usual sterile fashion. An adequate timeout was obtained and everyone agreed. A bivalve speculum was placed inside the vagina and the cervix visualized. The cervix was grasped anteriorly with a single-tooth tenaculum. Paracervical block was performed with 1% lidocaine.  The uterus sounded to 7 cm. Sequential cervical dilation was done to 12fr, and the myosure hysteroscope was introduced into the uterine cavity. The cervix and endometrial lining appeared normal both ostia were seen. Findings as noted. An endometrial polyp was seen and removed.  This was also used to sample the entire cavity.  A sharp curettage was then performed to ensure complete sampling of the cavity and was sent to pathology. All instrument, sponge and lap counts were correct x2. The patient was awakened taken to recovery room in stable condition.  Rosalyn Gess MD 03/21/2023 2:43 PM

## 2023-03-21 NOTE — Transfer of Care (Signed)
 Immediate Anesthesia Transfer of Care Note  Patient: Kerry [name]  Procedure(s) Performed: DILATATION & CURETTAGE/HYSTEROSCOPY WITH MYOSURE (Vagina )  Patient Location: PACU  Anesthesia Type:General  Level of Consciousness: awake, alert , oriented, and patient cooperative  Airway & Oxygen Therapy: Patient Spontanous Breathing and Patient connected to face mask oxygen  Post-op Assessment: Report given to RN and Post -op Vital signs reviewed and stable  Post vital signs: Reviewed and stable  Last Vitals:  Vitals Value Taken Time  BP 156/70 03/21/23 1447  Temp    Pulse 80 03/21/23 1449  Resp 18 03/21/23 1449  SpO2 100 % 03/21/23 1449  Vitals shown include unfiled device data.  Last Pain:  Vitals:   03/21/23 1259  TempSrc: Oral  PainSc: 2       Patients Stated Pain Goal: 5 (03/21/23 1259)  Complications: No notable events documented.

## 2023-03-22 ENCOUNTER — Other Ambulatory Visit (HOSPITAL_COMMUNITY): Payer: Self-pay | Admitting: Obstetrics and Gynecology

## 2023-03-22 ENCOUNTER — Encounter (HOSPITAL_COMMUNITY): Payer: Self-pay | Admitting: Obstetrics and Gynecology

## 2023-03-22 DIAGNOSIS — N8502 Endometrial intraepithelial neoplasia [EIN]: Secondary | ICD-10-CM | POA: Insufficient documentation

## 2023-03-22 LAB — SURGICAL PATHOLOGY

## 2023-03-22 NOTE — Anesthesia Postprocedure Evaluation (Signed)
 Anesthesia Post Note  Patient: Shenay Torti  Procedure(s) Performed: DILATATION & CURETTAGE/HYSTEROSCOPY WITH MYOSURE (Vagina )     Patient location during evaluation: PACU Anesthesia Type: General Level of consciousness: awake and alert Pain management: pain level controlled Vital Signs Assessment: post-procedure vital signs reviewed and stable Respiratory status: spontaneous breathing, nonlabored ventilation, respiratory function stable and patient connected to nasal cannula oxygen Cardiovascular status: blood pressure returned to baseline and stable Postop Assessment: no apparent nausea or vomiting Anesthetic complications: no  No notable events documented.  Last Vitals:  Vitals:   03/21/23 1545 03/21/23 1615  BP: (!) 147/72 (!) 160/77  Pulse: 67 (!) 59  Resp: 15   Temp:    SpO2: 97% 96%    Last Pain:  Vitals:   03/21/23 1615  TempSrc:   PainSc: 0-No pain                 Trevor Iha

## 2023-03-22 NOTE — Anesthesia Postprocedure Evaluation (Signed)
 Anesthesia Post Note  Patient: Kerry [name]  Procedure(s) Performed: DILATATION & CURETTAGE/HYSTEROSCOPY WITH MYOSURE (Vagina )     Anesthesia Type: General Anesthetic complications: no   No notable events documented.  Last Vitals:  Vitals:   03/21/23 1545 03/21/23 1615  BP: (!) 147/72 (!) 160/77  Pulse: 67 (!) 59  Resp: 15   Temp:    SpO2: 97% 96%    Last Pain:  Vitals:   03/21/23 1615  TempSrc:   PainSc: 0-No pain                 Trevor Iha

## 2023-03-23 ENCOUNTER — Telehealth: Payer: Self-pay

## 2023-03-23 ENCOUNTER — Encounter: Payer: Self-pay | Admitting: Genetic Counselor

## 2023-03-23 ENCOUNTER — Ambulatory Visit: Payer: Self-pay | Admitting: Genetic Counselor

## 2023-03-23 DIAGNOSIS — Z1379 Encounter for other screening for genetic and chromosomal anomalies: Secondary | ICD-10-CM | POA: Insufficient documentation

## 2023-03-23 NOTE — Telephone Encounter (Signed)
 Spoke with Kerry [name] regarding her referral to GYN oncology. She has an appointment scheduled with Dr. Eugene Garnet on 04/12/2023 at 11:15. Patient agrees to date and time. She has been provided with office address and location. She is also aware of our mask and visitor policy. Patient verbalized understanding and will call with any questions.

## 2023-03-23 NOTE — Progress Notes (Signed)
 HPI:  Kerry [name] was previously seen in the [facility] due to a family history of cancer and concerns regarding a hereditary predisposition to cancer. Please refer to our prior cancer genetics clinic note for more information regarding our discussion, assessment and recommendations, at the time. Kerry [name] recent genetic test results were disclosed to her, as were recommendations warranted by these results. These results and recommendations are discussed in more detail below.  Kerry [name] has since completed a D&C of a uterine mass, diagnosed with endometrial intraepithelial neoplasia.   FAMILY HISTORY:  We obtained a detailed, 4-generation family history.  Significant diagnoses are listed below: Family History  Problem Relation Age of Onset   Rheumatologic disease Mother    Diabetes Mother    Heart attack Father    Basal cell carcinoma Father    Rheumatologic disease Sister    Arthritis Sister        --Rheumatoid arthritis   Ovarian cancer Sister 68   Crohn's disease Brother    Kidney failure Brother    Ulcerative colitis Brother    Glaucoma Brother    Cancer Niece 11       gyn    Kerry [name] is unaware of previous family history of genetic testing for hereditary cancer risks.  There is no reported Ashkenazi Jewish ancestry. There is no known consanguinity.   Kerry [name] reports her sister, age 40, was diagnosed with ovarian cancer at age 47, treated with surgery only. Reports possible DES exposure for her sister. Kerry [name] reports her niece (sister's daughter) was diagnosed with cervical dysplasia at age 76, treated with hysterectomy. Kerry [name] reports her brother was diagnosed with skin cancer on his forehead, treated surgically, passed away at age 26 from unrelated causes. Her younger brother was diagnosed with skin cancer on his nose, treated surgically, living. She reports her father was diagnosed with [facility], passed away at age 95. She reports a maternal uncle  diagnosed with brain cancer at age 28, passed away at age 70. She reports a maternal aunt diagnosed with skin cancer, passed away at age 54. She reports a maternal aunt with lung cancer, smoking histor7, passed away at age 58. She reports a maternal first cousin diagnosed with colon cancer in his late 54s or early 36s, passed away at 29. She reports a maternal first cousin diagnosed with an unknown cancer at age 20, living at age 50.      GENETIC TEST RESULTS: Genetic testing reported out on 03/16/23 through the [facility] CancerNext-Expanded +RNAinsight panel found no pathogenic mutations. The CancerNext-Expanded gene panel offered by [facility] and includes sequencing, rearrangement, and RNA analysis for the following 76 genes: AIP, ALK, APC, ATM, AXIN2, BAP1, BARD1, BMPR1A, BRCA1, BRCA2, BRIP1, CDC73, CDH1, CDK4, CDKN1B, CDKN2A, CEBPA, CHEK2, CTNNA1, DDX41, DICER1, ETV6, FH, FLCN, GATA2, LZTR1, MAX, MBD4, MEN1, MET, MLH1, MSH2, MSH3, MSH6, MUTYH, NF1, NF2, NTHL1, PALB2, PHOX2B, PMS2, POT1, PRKAR1A, PTCH1, PTEN, RAD51C, RAD51D, RB1, RET, RUNX1, SDHA, SDHAF2, SDHB, SDHC, SDHD, SMAD4, SMARCA4, SMARCB1, SMARCE1, STK11, SUFU, TMEM127, TP53, TSC1, TSC2, VHL, and WT1 (sequencing and deletion/duplication); EGFR, HOXB13, KIT, MITF, PDGFRA, POLD1, and POLE (sequencing only); EPCAM and GREM1 (deletion/duplication only). The test report has been scanned into EPIC and is located under the Molecular Pathology section of the Results Review tab.  A portion of the result report is included below for reference.     We discussed with Kerry [name] that because current genetic testing is not perfect, it is possible  there may be a gene mutation in one of these genes that current testing cannot detect, but that chance is small.  We also discussed, that there could be another gene that has not yet been discovered, or that we have not yet tested, that is responsible for the cancer diagnoses in the family. It is also possible there is  a hereditary cause for the cancer in the family that Kerry [name] [name] not inherit and therefore was not identified in her testing.  Therefore, it is important to remain in touch with cancer genetics in the future so that we can continue to offer Kerry [name] the most up to date genetic testing.   ADDITIONAL GENETIC TESTING: We discussed with Kerry [name] that her genetic testing was fairly extensive.  If there are genes identified to increase cancer risk that can be analyzed in the future, we would be happy to discuss and coordinate this testing at that time.    CANCER SCREENING RECOMMENDATIONS: Kerry [name]'s test result is considered negative (normal).  This means that we have not identified a hereditary cause for her family history of cancer at this time. Most cancers happen by chance and this negative test suggests that her family history of cancer may fall into this category.    Possible reasons for Kerry [name] negative genetic test include:  1. There may be a gene mutation in one of these genes that current testing methods cannot detect but that chance is small.  2. There could be another gene that has not yet been discovered, or that we have not yet tested, that is responsible for the cancer diagnoses in the family.  3.  There may be no hereditary risk for cancer in the family. The cancers in Kerry [name] and/or her family may be sporadic/familial or due to other genetic and environmental factors. 4. It is also possible there is a hereditary cause for the cancer in the family that Kerry [name] not inherit.  Therefore, it is recommended she continue to follow the cancer management and screening guidelines provided by her primary healthcare provider. An individual's cancer risk and medical management are not determined by genetic test results alone. Overall cancer risk assessment incorporates additional factors, including personal medical history, family history, and any available genetic information that  may result in a personalized plan for cancer prevention and surveillance  Given Kerry [name] family histories, we must interpret these negative results with some caution.  Families with features suggestive of hereditary risk for cancer tend to have multiple family members with cancer, diagnoses in multiple generations and diagnoses before the age of 29. Ms. Malicoat's family exhibits some of these features. Thus, this result may simply reflect our current inability to detect all mutations within these genes or there may be a different gene that has not yet been discovered or tested.   An individual's cancer risk and medical management are not determined by genetic test results alone. Overall cancer risk assessment incorporates additional factors, including personal medical history, family history, and any available genetic information that may result in a personalized plan for cancer prevention and surveillance.   RECOMMENDATIONS FOR FAMILY MEMBERS:  Individuals in this family might be at some increased risk of developing cancer, over the general population risk, simply due to the family history of cancer.  We recommended women in this family have a yearly mammogram beginning at age 52, or 39 years younger than the earliest onset of cancer, an annual clinical breast exam, and perform  monthly breast self-exams. Women in this family should also have a gynecological exam as recommended by their primary provider. All family members should be referred for colonoscopy starting at age 35, or 88 years younger than the earliest onset of cancer.  It is also possible there is a hereditary cause for the cancer in Ms. Duford's family that she [name] not inherit and therefore was not identified in her.  Based on Ms. Wedel's family history, we recommended her sister, who was diagnosed with ovarian cancer at age 31, have genetic counseling and testing. Ms. Rance will let us know if we can be of any assistance in coordinating  genetic counseling and/or testing for this family member.   FOLLOW-UP: Lastly, we discussed with Ms. Haros that cancer genetics is a rapidly advancing field and it is possible that new genetic tests will be appropriate for her and/or her family members in the future. We encouraged her to remain in contact with cancer genetics on an annual basis so we can update her personal and family histories and let her know of advances in cancer genetics that may benefit this family.   Our contact number was provided. Ms. Hatler questions were answered to her satisfaction, and she knows she is welcome to call us at anytime with additional questions or concerns.   Vassie Moment, MS, [facility] Licensed, Retail [email or web address]

## 2023-03-23 NOTE — Telephone Encounter (Signed)
 I spoke to Kerry [name] to review results of genetic testing. She had genetic testing with [facility] CancerNext-Expanded +RNAinsight panel. Testing did not identify any variants known to increase the risk for cancer.  Discussed that we do not know why there is cancer in the family. It could be due to a different gene that we are not testing, or maybe our current technology may not be able to pick something up.  It will be important for her to keep in contact with genetics to keep up with whether additional testing may be needed.  Please see counseling note for further detail on this result.

## 2023-03-23 NOTE — Telephone Encounter (Signed)
 Lvm for patient to call office regarding a new patient referral received from Dr.Hines office. Available appointment with Dr.Tucker on 3/27

## 2023-03-23 NOTE — Telephone Encounter (Signed)
 Opened in error

## 2023-03-29 ENCOUNTER — Encounter: Payer: Self-pay | Admitting: Obstetrics and Gynecology

## 2023-04-03 ENCOUNTER — Ambulatory Visit (INDEPENDENT_AMBULATORY_CARE_PROVIDER_SITE_OTHER): Payer: Medicare Other | Admitting: Obstetrics and Gynecology

## 2023-04-03 ENCOUNTER — Encounter: Payer: Self-pay | Admitting: Obstetrics and Gynecology

## 2023-04-03 VITALS — BP 152/76 | HR 101 | Temp 98.0°F | Wt 238.0 lb

## 2023-04-03 VITALS — BP 186/79 | HR 98

## 2023-04-03 DIAGNOSIS — N3281 Overactive bladder: Secondary | ICD-10-CM

## 2023-04-03 DIAGNOSIS — N950 Postmenopausal bleeding: Secondary | ICD-10-CM

## 2023-04-03 DIAGNOSIS — N8502 Endometrial intraepithelial neoplasia [EIN]: Secondary | ICD-10-CM

## 2023-04-03 DIAGNOSIS — R350 Frequency of micturition: Secondary | ICD-10-CM

## 2023-04-03 NOTE — Progress Notes (Signed)
 [facility]  PTNS VISIT  CC:  Overactive bladder  73 y.o. with refractory overactive bladder who presents for percutaneous tibial nerve stimulation. The patient presents for PTNS maintenance session # 2.   Procedure: The patient was placed in the sitting position and the left lower extremity was prepped in the usual fashion. The PTNS needle was then inserted at a 60 degree angle, 5 cm cephalad and 2 cm posterior to the medial malleolus. The PTNS unit was then programmed and an optimal response was noted at 6 milliamps. The PTNS stimulation was then performed at this setting for 30 minutes without incident and the patient tolerated the procedure well. The needle was removed and hemostasis was noted.   The pt will return in 6 weeks for PTNS maintenance session #3. All questions were answered.

## 2023-04-03 NOTE — Progress Notes (Signed)
 73 y.o. G37P2002 female with [name], significant coronary artery disease s/p coronary stent placement (2024), HTN, OSA, GERD here for postoperative exam, 2wk s/p dx hysteroscopy, D&C, polypectomy.  Patient is status post hysteroscopy D&C in 2023 for benign endometrial polyp and submucosal fibroid.  Patient also has known cervical and uterine fibroids. Married, presents with her husband.  Patient's last menstrual period was 08/16/1992.  11/22/2022 EMB: Scant atrophic endometrial epithelium    Component Value Date/Time   DIAGPAP  11/22/2022 1453    - Negative for Intraepithelial Lesions or Malignancy (NILM)   DIAGPAP - Benign reactive/reparative changes 11/22/2022 1453   DIAGPAP  09/15/2020 0831    - Negative for intraepithelial lesion or malignancy (NILM)   HPVHIGH Negative 11/22/2022 1453   HPVHIGH Negative 09/15/2020 0831   ADEQPAP  11/22/2022 1453    Satisfactory for evaluation; transformation zone component PRESENT.   ADEQPAP  09/15/2020 0831    Satisfactory for evaluation; transformation zone component PRESENT.   ADEQPAP  08/07/2018 0000    Satisfactory for evaluation  endocervical/transformation zone component PRESENT.   02/06/2023 SIS with filling defect:  31 x 10 mm, and thickened endometrium. EMB with scant endometrium.  03/21/23 pathology:  A.  ENDOMETRIAL CURETTAGE:  Scanty inactive endometrium.  Benign endocervix and detached squamous cells.  Negative for endometrial intraepithelial neoplasia ([name]) carcinoma.   B.  ENDOMETRIAL POLYPECTOMY:  Endometrial intraepithelial neoplasia ([name]) involving fragments of  polyp.  Microscopic foci bordering on well-differentiated endometrioid  adenocarcinoma.   Has appt with GYN ONC 04/12/23. Pt states she is doing well. Had spotting over the weekend. Normal mucoid discharge without odor or irritation. Denies N/V, abdominal pain, dysuria. Normal BM and voids.   GYN HISTORY: 2023 D&C hysteroscopy with benign polyp and submucosal  fibroid sampling.  OB History  Gravida Para Term Preterm AB Living  2 2 2   2   SAB IAB Ectopic Multiple Live Births      2    # Outcome Date GA Lbr Len/2nd Weight Sex Type Anes PTL Lv  2 Term      Vag-Spont     1 Term      Vag-Forceps       Past Medical History:  Diagnosis Date   Abnormal uterine bleeding    Allergic contact dermatitis due to metals    Allergic rhinitis, cause unspecified    Bursitis of left shoulder    Chronic pain syndrome    Complication of anesthesia    03-20-2023  pt stated has issues with memory post op   Coronary artery disease 01/2022   cardiologist--- dr Lovina Reach;   positive PET NUC 01-24-2022;  cath 01-25-2022/  01-26-2022  multivessel CAD w/ severe global LV contractibility ef 55-60% LVEDP 30mm;    s/p  PTCA balloon mCX and OM2 and DES x1 to OM1   Dyspareunia    Endometrial mass    Family history of ovarian cancer    Full dentures    GERD (gastroesophageal reflux disease)    Hiatal hernia    History of adenomatous polyp of colon    History of cerebral infarction    neurologist--- dr Kirtland Bouchard. patel (lov note in epic 12-21-2021 for vertigo & HA)  MRI imaging incidental finding remote small vessal infarct right basal ganglia   History of esophageal stricture    s/p  EGD w/ dilatation 2010 and 03-12-2018   History of kidney stones    HTN (hypertension)    Hyperlipidemia    Interstitial lung disease (  HCC)    followed by dr Craige Cotta [facility] [place], [place])   long COVID related mild ILD with UIP pattern   Multiple pulmonary nodules    OA (osteoarthritis)    knees;  hands   OAB (overactive bladder)    OSA on CPAP    followed by pulmonology--- dr Craige Cotta;   HST in epic 03-04-2015  moderate osa   Polyarthralgia    Pre-diabetes    S/P drug eluting coronary stent placement 01/26/2022   PTCA balloon angioplasty and DES x1 toOM1   Somatic dysfunction    multiple sites;    lumbar & sacral regions;  ribs;   cervical spine   Urinary incontinence    Uterine  fibroid     Past Surgical History:  Procedure Laterality Date   CARPAL TUNNEL RELEASE Bilateral    left 05-21-2020;   right  1998   CATARACT EXTRACTION W/ INTRAOCULAR LENS IMPLANT Bilateral 2018   COLONOSCOPY WITH ESOPHAGOGASTRODUODENOSCOPY (EGD)  2023   [facility] [place]   COLONOSCOPY WITH ESOPHAGOGASTRODUODENOSCOPY (EGD) AND ESOPHAGEAL DILATION (ED)  03/12/2018   @ [facility]   CORONARY STENT INTERVENTION N/A 01/26/2022   Procedure: CORONARY STENT INTERVENTION;  Surgeon: Kathleene Hazel, MD;  [facility];  Service: Cardiovascular;  Laterality: N/A;   DILATATION & CURETTAGE/HYSTEROSCOPY WITH MYOSURE N/A 08/22/2021   Procedure: DILATATION & CURETTAGE/HYSTEROSCOPY WITH MYOSURE RESECTION OF ENDOMETRIAL MASS;  Surgeon: Patton Salles, MD;  Location[facility];  Service: Gynecology;  Laterality: N/A;   DILATATION & CURETTAGE/HYSTEROSCOPY WITH MYOSURE N/A 03/21/2023   Procedure: DILATATION & CURETTAGE/HYSTEROSCOPY WITH MYOSURE;  Surgeon: Rosalyn Gess, MD;  Location: [facility];  Service: Gynecology;  Laterality: N/A;  [facility]   DILATION AND CURETTAGE OF UTERUS  2000   HAMMER TOE SURGERY Right 1990   LEFT HEART CATH AND CORONARY ANGIOGRAPHY N/A 01/25/2022   Procedure: LEFT HEART CATH AND CORONARY ANGIOGRAPHY;  Surgeon: Lennette Bihari, MD;  [facility]: [facility];  Service: Cardiovascular;  Laterality: N/A;   NASAL SEPTUM SURGERY  2000   OPERATIVE ULTRASOUND N/A 08/22/2021   Procedure: OPERATIVE ULTRASOUND GUIDED HYSTEROSCOPY;  Surgeon: Patton Salles, MD;  Location: [facility];  Service: Gynecology;  Laterality: N/A;   PELVIC LAPAROSCOPY  1990   ROTATOR CUFF REPAIR Left 11/11/2013   @ [facility]  by  Dr. Ave Filter   SHOULDER ARTHROSCOPY W/ ROTATOR CUFF REPAIR Right 10/09/2011   @[facility]  by Dr. Ave Filter   UMBILICAL HERNIA REPAIR  2010   Patient reports mesh was used for the repair.    Current Outpatient  Medications on File Prior to Visit  Medication Sig Dispense Refill   amLODipine (NORVASC) 5 MG tablet Take 1 tablet (5 mg total) by mouth daily. (Patient taking differently: Take 5 mg by mouth daily after lunch.) 90 tablet 3   ascorbic acid (VITAMIN C) 250 MG tablet Take 250 mg by mouth daily.     aspirin EC 81 MG tablet Take 1 tablet (81 mg total) by mouth daily. Swallow whole. (Patient taking differently: Take 81 mg by mouth daily after lunch. Swallow whole.) 100 tablet 3   carboxymethylcellulose (REFRESH PLUS) 0.5 % SOLN Place 1 drop into both eyes 2 (two) times daily as needed (for dry eyes).     CINNAMON PO Take 2.5 mLs by mouth daily. Pt says she uses in food. It's not a tablet or pill     Coenzyme Q10 200 MG  capsule Take 200 mg by mouth daily after lunch.     cyanocobalamin (VITAMIN B12) 1000 MCG tablet Take 1,000 mcg by mouth every Monday, Wednesday, and Friday.     cyanocobalamin (VITAMIN B12) 1000 MCG/ML injection Inject 1,000 mcg into the muscle every 30 (thirty) days.     Evolocumab (REPATHA SURECLICK) 140 MG/ML SOAJ Inject 140 mg into the skin every 14 (fourteen) days. 2 mL 11   FIBER ADULT GUMMIES PO Take 2-3 each by mouth 2 (two) times daily as needed (constipation).     furosemide (LASIX) 40 MG tablet Take 1 tablet (40 mg total) by mouth every other day. 90 tablet 3   metoprolol succinate (TOPROL-XL) 25 MG 24 hr tablet Take 25 mg by mouth daily after lunch.     Multiple Vitamins-Minerals (SUPER MULTI-VITAMIN PO) Take 1 capsule by mouth daily. Solgar Formula VM-75     nitroGLYCERIN (NITROSTAT) 0.4 MG SL tablet Place 1 tablet (0.4 mg total) under the tongue every 5 (five) minutes x 3 doses as needed for chest pain. 25 tablet 1   NON FORMULARY Take 3 fluid ounces by mouth daily. Goat Kefir     Omega-3 Fatty Acids (FISH OIL) 500 MG CAPS Take 500 mg by mouth daily.     potassium chloride SA (KLOR-CON M) 20 MEQ tablet Take 1 tablet (20 mEq total) by mouth every other day. 90 tablet 3    Probiotic Product (PROBIOTIC ADVANCED PO) Take 1 capsule by mouth daily. Patient states taking 5 billion, Suprema Dophilus probiotic     Sodium Chloride-Xylitol (XLEAR SINUS CARE SPRAY) SOLN Place 2 sprays into the nose at bedtime. Rinse only     TART CHERRY PO Take 1 tablet by mouth daily.     triamcinolone cream (KENALOG) 0.5 % Apply 1 Application topically 2 (two) times daily as needed.     TURMERIC-GINGER PO Take 1 capsule by mouth 2 (two) times daily.     VITAMIN D-VITAMIN K PO Take 1 capsule by mouth every other day. Vit d 5000 units and of K     No current facility-administered medications on file prior to visit.    Allergies  Allergen Reactions   Meloxicam Other (See Comments)    Made patient have stomach pain ,sore throat, joint pains, face flush    Molds & Smuts Other (See Comments), Palpitations, Shortness Of Breath and Swelling   Cefixime Swelling   Celebrex [Celecoxib] Swelling   Cobalt Other (See Comments)    Per allergist   Gabapentin Other (See Comments)    Joint pain   Levofloxacin Other (See Comments)    Muscle and joint pain   Molybdenum Other (See Comments)    Per allergist   Other Other (See Comments)    TEQUIN. TEQUIN - JOINT AND MUSCLE PAIN Other Reaction: painful joints Tantal Metal   Penicillins Hives   Robaxin [Methocarbamol]    Statins Other (See Comments)    Muscle pain   Tomato Hives   Adhesive [Tape] Rash    Sensitive to EKG leads; sensitive to 19M Micropore tape   Dust Mite Extract Itching, Palpitations and Other (See Comments)   Tetracycline Hcl Rash    Can take Doxy   Tetracyclines & Related Rash      PE Today's Vitals   04/03/23 1156  BP: (!) 152/76  Pulse: (!) 101  Temp: 98 F (36.7 C)  TempSrc: Oral  SpO2: 97%  Weight: 238 lb (108 kg)   Body mass index is 46.48  kg/m.  Physical Exam Vitals reviewed.  Constitutional:      General: She is not in acute distress.    Appearance: Normal appearance.  HENT:     Head:  Normocephalic and atraumatic.     Nose: Nose normal.  Eyes:     Extraocular Movements: Extraocular movements intact.     Conjunctiva/sclera: Conjunctivae normal.  Pulmonary:     Effort: Pulmonary effort is normal.  Musculoskeletal:        General: Normal range of motion.     Cervical back: Normal range of motion.  Neurological:     General: No focal deficit present.     Mental Status: She is alert.  Psychiatric:        Mood and Affect: Mood normal.        Behavior: Behavior normal.      Assessment and Plan:        [name] (endometrial intraepithelial neoplasia) Postmenopausal bleeding  S/p dx hysteroscopy, D&C, polypectomy with [name] (microscopic foci bordering on well-differentiated endometrioid adenocarcinoma) noted on pathology. Referral sent GYN ONC, has appt on 04/12/23 Normal postop visit, recovering well. All questions answered.   Rosalyn Gess, MD

## 2023-04-03 NOTE — Progress Notes (Deleted)
 73 y.o. G90P2002 female with *** here for surgical consultation for ***  Patient's last menstrual period was 08/16/1992.    Last PAP:    Component Value Date/Time   DIAGPAP  11/22/2022 1453    - Negative for Intraepithelial Lesions or Malignancy (NILM)   DIAGPAP - Benign reactive/reparative changes 11/22/2022 1453   DIAGPAP  09/15/2020 0831    - Negative for intraepithelial lesion or malignancy (NILM)   HPVHIGH Negative 11/22/2022 1453   HPVHIGH Negative 09/15/2020 0831   ADEQPAP  11/22/2022 1453    Satisfactory for evaluation; transformation zone component PRESENT.   ADEQPAP  09/15/2020 0831    Satisfactory for evaluation; transformation zone component PRESENT.   ADEQPAP  08/07/2018 0000    Satisfactory for evaluation  endocervical/transformation zone component PRESENT.   Birth control: *** Sexually active: ***    GYN HISTORY: ***  OB History  Gravida Para Term Preterm AB Living  2 2 2   2   SAB IAB Ectopic Multiple Live Births      2    # Outcome Date GA Lbr Len/2nd Weight Sex Type Anes PTL Lv  2 Term      Vag-Spont     1 Term      Vag-Forceps       Past Medical History:  Diagnosis Date   Abnormal uterine bleeding    Allergic contact dermatitis due to metals    Allergic rhinitis, cause unspecified    Bursitis of left shoulder    Chronic pain syndrome    Complication of anesthesia    03-20-2023  pt stated has issues with memory post op   Coronary artery disease 01/2022   cardiologist--- dr Lovina Reach;   positive PET NUC 01-24-2022;  cath 01-25-2022/  01-26-2022  multivessel CAD w/ severe global LV contractibility ef 55-60% LVEDP 30mm;    s/p  PTCA balloon mCX and OM2 and DES x1 to OM1   Dyspareunia    Endometrial mass    Family history of ovarian cancer    Full dentures    GERD (gastroesophageal reflux disease)    Hiatal hernia    History of adenomatous polyp of colon    History of cerebral infarction    neurologist--- dr Kirtland Bouchard. patel (lov note in epic  12-21-2021 for vertigo & HA)  MRI imaging incidental finding remote small vessal infarct right basal ganglia   History of esophageal stricture    s/p  EGD w/ dilatation 2010 and 03-12-2018   History of kidney stones    HTN (hypertension)    Hyperlipidemia    Interstitial lung disease (HCC)    followed by dr Craige Cotta ([facility] [place], [place])   long COVID related mild ILD with UIP pattern   Multiple pulmonary nodules    OA (osteoarthritis)    knees;  hands   OAB (overactive bladder)    OSA on CPAP    followed by pulmonology--- dr Craige Cotta;   HST in epic 03-04-2015  moderate osa   Polyarthralgia    Pre-diabetes    S/P drug eluting coronary stent placement 01/26/2022   PTCA balloon angioplasty and DES x1 toOM1   Somatic dysfunction    multiple sites;    lumbar & sacral regions;  ribs;   cervical spine   Urinary incontinence    Uterine fibroid     Past Surgical History:  Procedure Laterality Date   CARPAL TUNNEL RELEASE Bilateral    left 05-21-2020;   right  1998   CATARACT  EXTRACTION W/ INTRAOCULAR LENS IMPLANT Bilateral 2018   COLONOSCOPY WITH ESOPHAGOGASTRODUODENOSCOPY (EGD)  2023   [facility] [place]   COLONOSCOPY WITH ESOPHAGOGASTRODUODENOSCOPY (EGD) AND ESOPHAGEAL DILATION (ED)  03/12/2018   @ [facility]   CORONARY STENT INTERVENTION N/A 01/26/2022   Procedure: CORONARY STENT INTERVENTION;  Surgeon: Kathleene Hazel, MD;  [facility];  Service: Cardiovascular;  Laterality: N/A;   DILATATION & CURETTAGE/HYSTEROSCOPY WITH MYOSURE N/A 08/22/2021   Procedure: DILATATION & CURETTAGE/HYSTEROSCOPY WITH MYOSURE RESECTION OF ENDOMETRIAL MASS;  Surgeon: Patton Salles, MD;  Location[facility];  Service: Gynecology;  Laterality: N/A;   DILATATION & CURETTAGE/HYSTEROSCOPY WITH MYOSURE N/A 03/21/2023   Procedure: DILATATION & CURETTAGE/HYSTEROSCOPY WITH MYOSURE;  Surgeon: Rosalyn Gess, MD;  Location: [facility];  Service:  Gynecology;  Laterality: N/A;  [facility]   DILATION AND CURETTAGE OF UTERUS  2000   HAMMER TOE SURGERY Right 1990   LEFT HEART CATH AND CORONARY ANGIOGRAPHY N/A 01/25/2022   Procedure: LEFT HEART CATH AND CORONARY ANGIOGRAPHY;  Surgeon: Lennette Bihari, MD;  [facility];  Service: Cardiovascular;  Laterality: N/A;   NASAL SEPTUM SURGERY  2000   OPERATIVE ULTRASOUND N/A 08/22/2021   Procedure: OPERATIVE ULTRASOUND GUIDED HYSTEROSCOPY;  Surgeon: Patton Salles, MD;  Location: [facility];  Service: Gynecology;  Laterality: N/A;   PELVIC LAPAROSCOPY  1990   ROTATOR CUFF REPAIR Left 11/11/2013   @ [facility]  by  Dr. Ave Filter   SHOULDER ARTHROSCOPY W/ ROTATOR CUFF REPAIR Right 10/09/2011   @[facility]  by Dr. Ave Filter   UMBILICAL HERNIA REPAIR  2010   Patient reports mesh was used for the repair.    Current Outpatient Medications on File Prior to Visit  Medication Sig Dispense Refill   amLODipine (NORVASC) 5 MG tablet Take 1 tablet (5 mg total) by mouth daily. (Patient taking differently: Take 5 mg by mouth daily after lunch.) 90 tablet 3   ascorbic acid (VITAMIN C) 250 MG tablet Take 250 mg by mouth daily.     aspirin EC 81 MG tablet Take 1 tablet (81 mg total) by mouth daily. Swallow whole. (Patient taking differently: Take 81 mg by mouth daily after lunch. Swallow whole.) 100 tablet 3   carboxymethylcellulose (REFRESH PLUS) 0.5 % SOLN Place 1 drop into both eyes 2 (two) times daily as needed (for dry eyes).     CINNAMON PO Take 2.5 mLs by mouth daily. Pt says she uses in food. It's not a tablet or pill     Coenzyme Q10 200 MG capsule Take 200 mg by mouth daily after lunch.     cyanocobalamin (VITAMIN B12) 1000 MCG tablet Take 1,000 mcg by mouth every Monday, Wednesday, and Friday.     cyanocobalamin (VITAMIN B12) 1000 MCG/ML injection Inject 1,000 mcg into the muscle every 30 (thirty) days.     Evolocumab (REPATHA SURECLICK) 140 MG/ML SOAJ Inject 140 mg into the skin  every 14 (fourteen) days. 2 mL 11   FIBER ADULT GUMMIES PO Take 2-3 each by mouth 2 (two) times daily as needed (constipation).     furosemide (LASIX) 40 MG tablet Take 1 tablet (40 mg total) by mouth every other day. 90 tablet 3   metoprolol succinate (TOPROL-XL) 25 MG 24 hr tablet Take 25 mg by mouth daily after lunch.     Multiple Vitamins-Minerals (SUPER MULTI-VITAMIN PO) Take 1 capsule by mouth daily. Solgar Formula VM-75  nitroGLYCERIN (NITROSTAT) 0.4 MG SL tablet Place 1 tablet (0.4 mg total) under the tongue every 5 (five) minutes x 3 doses as needed for chest pain. 25 tablet 1   NON FORMULARY Take 3 fluid ounces by mouth daily. Goat Kefir     Omega-3 Fatty Acids (FISH OIL) 500 MG CAPS Take 500 mg by mouth daily.     potassium chloride SA (KLOR-CON M) 20 MEQ tablet Take 1 tablet (20 mEq total) by mouth every other day. 90 tablet 3   Probiotic Product (PROBIOTIC ADVANCED PO) Take 1 capsule by mouth daily. Patient states taking 5 billion, Suprema Dophilus probiotic     Sodium Chloride-Xylitol (XLEAR SINUS CARE SPRAY) SOLN Place 2 sprays into the nose at bedtime. Rinse only     TART CHERRY PO Take 1 tablet by mouth daily.     triamcinolone cream (KENALOG) 0.5 % Apply 1 Application topically 2 (two) times daily as needed.     TURMERIC-GINGER PO Take 1 capsule by mouth 2 (two) times daily.     VITAMIN D-VITAMIN K PO Take 1 capsule by mouth every other day. Vit d 5000 units and of K     No current facility-administered medications on file prior to visit.    Allergies  Allergen Reactions   Meloxicam Other (See Comments)    Made patient have stomach pain ,sore throat, joint pains, face flush    Molds & Smuts Other (See Comments), Palpitations, Shortness Of Breath and Swelling   Cefixime Swelling   Celebrex [Celecoxib] Swelling   Cobalt Other (See Comments)    Per allergist   Gabapentin Other (See Comments)    Joint pain   Levofloxacin Other (See Comments)    Muscle and joint  pain   Molybdenum Other (See Comments)    Per allergist   Other Other (See Comments)    TEQUIN. TEQUIN - JOINT AND MUSCLE PAIN Other Reaction: painful joints Tantal Metal   Penicillins Hives   Robaxin [Methocarbamol]    Statins Other (See Comments)    Muscle pain   Tomato Hives   Adhesive [Tape] Rash    Sensitive to EKG leads; sensitive to 73M Micropore tape   Dust Mite Extract Itching, Palpitations and Other (See Comments)   Tetracycline Hcl Rash    Can take Doxy   Tetracyclines & Related Rash      PE Today's Vitals   04/03/23 1156  BP: (!) 152/76  Pulse: (!) 101  Temp: 98 F (36.7 C)  TempSrc: Oral  SpO2: 97%  Weight: 238 lb (108 kg)   Body mass index is 46.48 kg/m.  Physical Exam    Assessment and Plan:        There are no diagnoses linked to this encounter.   Rosalyn Gess, MD

## 2023-04-08 ENCOUNTER — Encounter: Payer: Self-pay | Admitting: Family Medicine

## 2023-04-09 NOTE — Progress Notes (Unsigned)
 [name] [address] [place] [address] Phone: [phone] Subjective:   Kerry [name], am serving as a scribe for Dr. Antoine Primas.  I'm seeing this patient by the request  of:  Street, [name], MD  CC: Low back pain, neck pain, bilateral knee pain, right greater than left  [id number]  Kerry [name] is a 73 y.o. female coming in with complaint of back and neck pain. OMT on 02/26/2023. Patient states that she is having pain in R SI joint. Stiffness in L side of cervical spine.   Unsure if she is using [employer] correctly on her knee. Pain seems to be less when she is using unit.   Noticed that grip strength in L hand is less than the R. She has had issue since carpal tunnel surgery 2 years ago. Unable to fully clasp [employer] unit.   Interested in a Chiropodist in which she would use forearms.   Medications patient has been prescribed:   Taking:         Reviewed prior external information including notes and imaging from previsou exam, outside providers and external EMR if available.   As well as notes that were available from care everywhere and other healthcare systems.  Past medical history, social, surgical and family history all reviewed in electronic medical record.  No pertanent information unless stated regarding to the chief complaint.   Past Medical History:  Diagnosis Date   Abnormal uterine bleeding    Allergic contact dermatitis due to metals    Allergic rhinitis, cause unspecified    Bursitis of left shoulder    Chronic pain syndrome    Complication of anesthesia    03-20-2023  pt stated has issues with memory post op   Coronary artery disease 01/2022   cardiologist--- dr Lovina Reach;   positive PET NUC 01-24-2022;  cath 01-25-2022/  01-26-2022  multivessel CAD w/ severe global LV contractibility ef 55-60% LVEDP 30mm;    s/p  PTCA balloon mCX and OM2 and DES x1 to OM1   Dyspareunia     Endometrial mass    Family history of ovarian cancer    Full dentures    GERD (gastroesophageal reflux disease)    Hiatal hernia    History of adenomatous polyp of colon    History of cerebral infarction    neurologist--- dr Kirtland Bouchard. patel (lov note in epic 12-21-2021 for vertigo & HA)  MRI imaging incidental finding remote small vessal infarct right basal ganglia   History of esophageal stricture    s/p  EGD w/ dilatation 2010 and 03-12-2018   History of kidney stones    HTN (hypertension)    Hyperlipidemia    Interstitial lung disease (HCC)    followed by dr Craige Cotta ([facility] [place] [place])   long COVID related mild ILD with UIP pattern   Multiple pulmonary nodules    OA (osteoarthritis)    knees;  hands   OAB (overactive bladder)    OSA on CPAP    followed by pulmonology--- dr Craige Cotta;   HST in epic 03-04-2015  moderate osa   Polyarthralgia    Pre-diabetes    S/P drug eluting coronary stent placement 01/26/2022   PTCA balloon angioplasty and DES x1 toOM1   Somatic dysfunction    multiple sites;    lumbar & sacral regions;  ribs;   cervical spine   Urinary incontinence    Uterine fibroid  Allergies  Allergen Reactions   Meloxicam Other (See Comments)    Made patient have stomach pain ,sore throat, joint pains, face flush    Molds & Smuts Other (See Comments), Palpitations, Shortness Of Breath and Swelling   Cefixime Swelling   Celebrex [Celecoxib] Swelling   Cobalt Other (See Comments)    Per allergist   Gabapentin Other (See Comments)    Joint pain   Levofloxacin Other (See Comments)    Muscle and joint pain   Molybdenum Other (See Comments)    Per allergist   Other Other (See Comments)    TEQUIN. TEQUIN - JOINT AND MUSCLE PAIN Other Reaction: painful joints Tantal Metal   Penicillins Hives   Robaxin [Methocarbamol]    Statins Other (See Comments)    Muscle pain   Tomato Hives   Adhesive [Tape] Rash    Sensitive to EKG leads; sensitive to 92M Micropore tape    Dust Mite Extract Itching, Palpitations and Other (See Comments)   Tetracycline Hcl Rash    Can take Doxy   Tetracyclines & Related Rash     Review of Systems:  No headache, visual changes, nausea, vomiting, diarrhea, constipation, dizziness, abdominal pain, skin rash, fevers, chills, night sweats, weight loss, swollen lymph nodes,  chest pain, shortness of breath, mood changes. POSITIVE muscle aches, body aches, joint swelling  Objective  Blood pressure (!) 148/72, pulse 75, height 5' (1.524 m), weight 238 lb (108 kg), last menstrual period 08/16/1992, SpO2 99%.   General: No apparent distress alert and oriented x3 mood and affect normal, dressed appropriately.  HEENT: Pupils equal, extraocular movements intact  Respiratory: Patient's speak in full sentences and does not appear short of breath  Cardiovascular: No lower extremity edema, non tender, no erythema  Back does have some loss lordosis noted.  Patient does have some crepitus noted.  Some tenderness to palpation as well.  Severely antalgic gait noted.  Osteopathic findings  C2 flexed rotated and side bent right C3 flexed rotated and side bent right C5 flexed rotated and side bent left T3 extended rotated and side bent right inhaled rib T9 extended rotated and side bent left L2 flexed rotated and side bent right Sacrum right on right     Assessment and Plan:  Degenerative cervical disc Significant arthritic changes that could be potentially contributing to some weakness in the upper extremity.  Discussed icing regimen and home exercises, discussed which activities to do and which ones to avoid.  Discussed posture and ergonomics otherwise.  Follow-up again in 6 to 8 weeks.  If worsening pain or weakness in the hand we will consider nerve conduction test sooner than any advanced imaging of the neck at the moment.  [name] (endometrial intraepithelial neoplasia) Being followed by oncology as well as gynecology.  Arthritis of  knee, degenerative Patient will continue to use the ultrasound machine to see if it will be beneficial.  Patient still wants to avoid any surgical intervention and at the moment would not be good timing anyhow.  BMI would also have to be under 40 for any type of surgical intervention.    Nonallopathic problems  Decision today to treat with OMT was based on Physical Exam  After verbal consent patient was treated with  ME, techniques in cervical, rib, thoracic, lumbar, and sacral  areas  Patient tolerated the procedure well with improvement in symptoms  Patient given exercises, stretches and lifestyle modifications  See medications in patient instructions if given  Patient will follow up  in 4-8 weeks     The above documentation has been reviewed and is accurate and complete Judi Saa, DO         Note: This dictation was prepared with Dragon dictation along with smaller phrase technology. Any transcriptional errors that result from this process are unintentional.

## 2023-04-10 ENCOUNTER — Encounter: Payer: Self-pay | Admitting: Gynecologic Oncology

## 2023-04-10 ENCOUNTER — Encounter: Payer: Self-pay | Admitting: Family Medicine

## 2023-04-10 ENCOUNTER — Ambulatory Visit (INDEPENDENT_AMBULATORY_CARE_PROVIDER_SITE_OTHER): Payer: Medicare Other | Admitting: Family Medicine

## 2023-04-10 VITALS — BP 148/72 | HR 75 | Ht 60.0 in | Wt 238.0 lb

## 2023-04-10 DIAGNOSIS — M170 Bilateral primary osteoarthritis of knee: Secondary | ICD-10-CM | POA: Diagnosis not present

## 2023-04-10 DIAGNOSIS — M5030 Other cervical disc degeneration, unspecified cervical region: Secondary | ICD-10-CM | POA: Diagnosis not present

## 2023-04-10 DIAGNOSIS — M9901 Segmental and somatic dysfunction of cervical region: Secondary | ICD-10-CM | POA: Diagnosis not present

## 2023-04-10 DIAGNOSIS — M9904 Segmental and somatic dysfunction of sacral region: Secondary | ICD-10-CM | POA: Diagnosis not present

## 2023-04-10 DIAGNOSIS — M9903 Segmental and somatic dysfunction of lumbar region: Secondary | ICD-10-CM

## 2023-04-10 DIAGNOSIS — M9902 Segmental and somatic dysfunction of thoracic region: Secondary | ICD-10-CM

## 2023-04-10 DIAGNOSIS — N8502 Endometrial intraepithelial neoplasia [EIN]: Secondary | ICD-10-CM

## 2023-04-10 DIAGNOSIS — M9908 Segmental and somatic dysfunction of rib cage: Secondary | ICD-10-CM

## 2023-04-10 NOTE — Assessment & Plan Note (Signed)
 Patient will continue to use the ultrasound machine to see if it will be beneficial.  Patient still wants to avoid any surgical intervention and at the moment would not be good timing anyhow.  BMI would also have to be under 40 for any type of surgical intervention.

## 2023-04-10 NOTE — Progress Notes (Deleted)
 73 y.o. G75P2002 Married Caucasian female here for a breast and pelvic exam.    The patient is also followed for ***.  PCP: Street, [name], MD   Patient's last menstrual period was 08/16/1992.           Sexually active: No.  The current method of family planning is post menopausal status.    Menopausal hormone therapy:  *** Exercising: {yes no:314532}  {types:19826} Smoker:  no  OB History     Gravida  2   Para  2   Term  2   Preterm      AB      Living  2      SAB      IAB      Ectopic      Multiple      Live Births  2           HEALTH MAINTENANCE: Last 2 paps: 11/22/22 neg: HR HPV neg, 09/15/20 neg: HR HPV neg  History of abnormal Pap or positive HPV:  no Mammogram:  10/18/22 Breast Density Cat C, BI-RADS CAT 1 neg  Colonoscopy:  03/12/18 Bone Density:  09/10/13  Result  normal   Immunization History  Administered Date(s) Administered   Fluad Quad(high Dose 65+) 10/17/2019   Influenza Split 09/27/2020   Influenza,inj,Quad PF,6+ Mos 11/17/2014, 11/01/2017, 10/02/2018   Influenza-Unspecified 11/16/2021   PFIZER(Purple Top)SARS-COV-2 Vaccination 02/22/2019, 03/19/2019, 11/22/2019, 04/17/2020   Pfizer(Comirnaty)Fall Seasonal Vaccine 12 years and older 12/16/2021      reports that she has never smoked. She has never used smokeless tobacco. She reports that she does not drink alcohol and does not use drugs.  Past Medical History:  Diagnosis Date   Abnormal uterine bleeding    Allergic contact dermatitis due to metals    Allergic rhinitis, cause unspecified    Bursitis of left shoulder    Chronic pain syndrome    Complication of anesthesia    03-20-2023  pt stated has issues with memory post op   Coronary artery disease 01/2022   cardiologist--- dr Lovina Reach;   positive PET NUC 01-24-2022;  cath 01-25-2022/  01-26-2022  multivessel CAD w/ severe global LV contractibility ef 55-60% LVEDP 30mm;    s/p  PTCA balloon mCX and OM2 and DES x1 to OM1    Dyspareunia    Endometrial mass    Family history of ovarian cancer    Full dentures    GERD (gastroesophageal reflux disease)    Hiatal hernia    History of adenomatous polyp of colon    History of cerebral infarction    neurologist--- dr Kirtland Bouchard. patel (lov note in epic 12-21-2021 for vertigo & HA)  MRI imaging incidental finding remote small vessal infarct right basal ganglia   History of esophageal stricture    s/p  EGD w/ dilatation 2010 and 03-12-2018   History of kidney stones    HTN (hypertension)    Hyperlipidemia    Interstitial lung disease (HCC)    followed by dr Craige Cotta ([facility] [place], [place])   long COVID related mild ILD with UIP pattern   Multiple pulmonary nodules    OA (osteoarthritis)    knees;  hands   OAB (overactive bladder)    OSA on CPAP    followed by pulmonology--- dr Craige Cotta;   HST in epic 03-04-2015  moderate osa   Polyarthralgia    Pre-diabetes    S/P drug eluting coronary stent placement 01/26/2022   PTCA balloon  angioplasty and DES x1 toOM1   Somatic dysfunction    multiple sites;    lumbar & sacral regions;  ribs;   cervical spine   Urinary incontinence    Uterine fibroid     Past Surgical History:  Procedure Laterality Date   CARPAL TUNNEL RELEASE Bilateral    left 05-21-2020;   right  1998   CATARACT EXTRACTION W/ INTRAOCULAR LENS IMPLANT Bilateral 2018   COLONOSCOPY WITH ESOPHAGOGASTRODUODENOSCOPY (EGD)  2023   [facility] [place]   COLONOSCOPY WITH ESOPHAGOGASTRODUODENOSCOPY (EGD) AND ESOPHAGEAL DILATION (ED)  03/12/2018   @ [facility]   CORONARY STENT INTERVENTION N/A 01/26/2022   Procedure: CORONARY STENT INTERVENTION;  Surgeon: Kathleene Hazel, MD;  Location: [facility];  Service: Cardiovascular;  Laterality: N/A;   DILATATION & CURETTAGE/HYSTEROSCOPY WITH MYOSURE N/A 08/22/2021   Procedure: DILATATION & CURETTAGE/HYSTEROSCOPY WITH MYOSURE RESECTION OF ENDOMETRIAL MASS;  Surgeon: Patton Salles, MD;  Location: [facility];  Service: Gynecology;  Laterality: N/A;   DILATATION & CURETTAGE/HYSTEROSCOPY WITH MYOSURE N/A 03/21/2023   Procedure: DILATATION & CURETTAGE/HYSTEROSCOPY WITH MYOSURE;  Surgeon: Rosalyn Gess, MD;  Location: [facility];  Service: Gynecology;  Laterality: N/A;  [facility]   DILATION AND CURETTAGE OF UTERUS  2000   HAMMER TOE SURGERY Right 1990   LEFT HEART CATH AND CORONARY ANGIOGRAPHY N/A 01/25/2022   Procedure: LEFT HEART CATH AND CORONARY ANGIOGRAPHY;  Surgeon: Lennette Bihari, MD;  Location: [facility];  Service: Cardiovascular;  Laterality: N/A;   NASAL SEPTUM SURGERY  2000   OPERATIVE ULTRASOUND N/A 08/22/2021   Procedure: OPERATIVE ULTRASOUND GUIDED HYSTEROSCOPY;  Surgeon: Patton Salles, MD;  Location: [facility];  Service: Gynecology;  Laterality: N/A;   PELVIC LAPAROSCOPY  1990   ROTATOR CUFF REPAIR Left 11/11/2013   @ [facility]  by  Dr. Ave Filter   SHOULDER ARTHROSCOPY W/ ROTATOR CUFF REPAIR Right 10/09/2011   @[facility]  by Dr. Ave Filter   UMBILICAL HERNIA REPAIR  2010   Patient reports mesh was used for the repair.    Current Outpatient Medications  Medication Sig Dispense Refill   amLODipine (NORVASC) 5 MG tablet Take 1 tablet (5 mg total) by mouth daily. (Patient taking differently: Take 5 mg by mouth daily after lunch.) 90 tablet 3   ascorbic acid (VITAMIN C) 250 MG tablet Take 250 mg by mouth daily.     aspirin EC 81 MG tablet Take 1 tablet (81 mg total) by mouth daily. Swallow whole. (Patient taking differently: Take 81 mg by mouth daily after lunch. Swallow whole.) 100 tablet 3   carboxymethylcellulose (REFRESH PLUS) 0.5 % SOLN Place 1 drop into both eyes 2 (two) times daily as needed (for dry eyes).     CINNAMON PO Take 2.5 mLs by mouth daily. Pt says she uses in food. It's not a tablet or pill     Coenzyme Q10 200 MG capsule Take 200 mg by mouth daily after lunch.     cyanocobalamin (VITAMIN B12) 1000 MCG tablet  Take 1,000 mcg by mouth every Monday, Wednesday, and Friday.     cyanocobalamin (VITAMIN B12) 1000 MCG/ML injection Inject 1,000 mcg into the muscle every 30 (thirty) days.     Evolocumab (REPATHA SURECLICK) 140 MG/ML SOAJ Inject 140 mg into the skin every 14 (fourteen) days. 2 mL 11   FIBER ADULT GUMMIES PO Take 2-3 each by mouth 2 (two) times daily as needed (constipation).  furosemide (LASIX) 40 MG tablet Take 1 tablet (40 mg total) by mouth every other day. 90 tablet 3   metoprolol succinate (TOPROL-XL) 25 MG 24 hr tablet Take 25 mg by mouth daily after lunch.     Multiple Vitamins-Minerals (SUPER MULTI-VITAMIN PO) Take 1 capsule by mouth daily. Solgar Formula VM-75     nitroGLYCERIN (NITROSTAT) 0.4 MG SL tablet Place 1 tablet (0.4 mg total) under the tongue every 5 (five) minutes x 3 doses as needed for chest pain. 25 tablet 1   NON FORMULARY Take 3 fluid ounces by mouth daily. Goat Kefir     Omega-3 Fatty Acids (FISH OIL) 500 MG CAPS Take 500 mg by mouth daily.     potassium chloride SA (KLOR-CON M) 20 MEQ tablet Take 1 tablet (20 mEq total) by mouth every other day. 90 tablet 3   Probiotic Product (PROBIOTIC ADVANCED PO) Take 1 capsule by mouth daily. Patient states taking 5 billion, Suprema Dophilus probiotic     Sodium Chloride-Xylitol (XLEAR SINUS CARE SPRAY) SOLN Place 2 sprays into the nose at bedtime. Rinse only     TART CHERRY PO Take 1 tablet by mouth daily.     triamcinolone cream (KENALOG) 0.5 % Apply 1 Application topically 2 (two) times daily as needed.     TURMERIC-GINGER PO Take 1 capsule by mouth 2 (two) times daily.     VITAMIN D-VITAMIN K PO Take 1 capsule by mouth every other day. Vit d 5000 units and of K     No current facility-administered medications for this visit.    ALLERGIES: Meloxicam, Molds & smuts, Cefixime, Celebrex [celecoxib], Cobalt, Gabapentin, Levofloxacin, Molybdenum, Other, Penicillins, Robaxin [methocarbamol], Statins, Tomato, Adhesive  [tape], Dust mite extract, Tetracycline hcl, and Tetracyclines & related  Family History  Problem Relation Age of Onset   Rheumatologic disease Mother    Diabetes Mother    Heart attack Father    Basal cell carcinoma Father    Rheumatologic disease Sister    Arthritis Sister        --Rheumatoid arthritis   Ovarian cancer Sister 29   Crohn's disease Brother    Kidney failure Brother    Ulcerative colitis Brother    Glaucoma Brother    Cancer Niece 46       gyn    Review of Systems  PHYSICAL EXAM:  LMP 08/16/1992     General appearance: alert, cooperative and appears stated age Head: normocephalic, without obvious abnormality, atraumatic Neck: no adenopathy, supple, symmetrical, trachea midline and thyroid normal to inspection and palpation Lungs: clear to auscultation bilaterally Breasts: normal appearance, no masses or tenderness, No nipple retraction or dimpling, No nipple discharge or bleeding, No axillary adenopathy Heart: regular rate and rhythm Abdomen: soft, non-tender; no masses, no organomegaly Extremities: extremities normal, atraumatic, no cyanosis or edema Skin: skin color, texture, turgor normal. No rashes or lesions Lymph nodes: cervical, supraclavicular, and axillary nodes normal. Neurologic: grossly normal  Pelvic: External genitalia:  no lesions              No abnormal inguinal nodes palpated.              Urethra:  normal appearing urethra with no masses, tenderness or lesions              Bartholins and Skenes: normal                 Vagina: normal appearing vagina with normal color and discharge, no lesions  Cervix: no lesions              Pap taken: {yes no:314532} Bimanual Exam:  Uterus:  normal size, contour, position, consistency, mobility, non-tender              Adnexa: no mass, fullness, tenderness              Rectal exam: {yes no:314532}.  Confirms.              Anus:  normal sphincter tone, no lesions  Chaperone was present for  exam:  {BSCHAPERONE:31226::"Saran Laviolette F, CMA"}  ASSESSMENT: Encounter for breast and pelvic exam.   ***  PLAN: Mammogram screening discussed. Self breast awareness reviewed. Pap and HRV collected:  {yes no:314532} Guidelines for Calcium, Vitamin D, regular exercise program including cardiovascular and weight bearing exercise. Medication refills:  *** {LABS (Optional):23779} Follow up:  ***    Additional counseling given.  {yes [id number]. ***  total time was spent for this patient encounter, including preparation, face-to-face counseling with the patient, coordination of care, and documentation of the encounter in addition to doing the breast and pelvic exam.

## 2023-04-10 NOTE — Patient Instructions (Addendum)
 [facility] All-Terrain Upright Rollator Walker UP walker Exercises See me again in 6-8 weeks

## 2023-04-10 NOTE — Assessment & Plan Note (Signed)
 Being followed by oncology as well as gynecology.

## 2023-04-10 NOTE — Assessment & Plan Note (Signed)
 Significant arthritic changes that could be potentially contributing to some weakness in the upper extremity.  Discussed icing regimen and home exercises, discussed which activities to do and which ones to avoid.  Discussed posture and ergonomics otherwise.  Follow-up again in 6 to 8 weeks.  If worsening pain or weakness in the hand we will consider nerve conduction test sooner than any advanced imaging of the neck at the moment.

## 2023-04-11 NOTE — Progress Notes (Unsigned)
 GYNECOLOGIC ONCOLOGY NEW PATIENT CONSULTATION   Patient Name: Kerry [name]  Patient Age: 73 y.o. Date of Service: 04/12/23 Referring Provider: Dr. Darrell Jewel  Primary Care Provider: Street, [name], MD Consulting Provider: Eugene Garnet, MD   Assessment/Plan:  Postmenopausal patient with [name].  We reviewed the diagnosis of endometrial intraepithelial neoplasia ([name]) and the treatment options, including medical management (Mirena IUD or progesterone PO) or hysterectomy.  Recent biopsy showed [name] within fragments of a polyp.  There was some microscopic foci bordering on well-differentiated endometrial cancer but did not meet criteria.  Background endometrium with scant but negative for [name].  SURGERY: The patient is a suitable candidate for hysterectomy via a minimally invasive approach to surgery.  Given that she is postmenopausal, a bilateral salpingo-oophorectomy is also recommended.  We reviewed that robotic assistance would be used to complete the surgery.  We discussed that endometrial cancer is detected in about 40% of final uterine pathology specimens from patients with [name].  We discussed need for cardiac and pulmonary clearance given her medical comorbidities.  We also reviewed increased morbidity in the setting of her obesity.  MEDICAL: We reviewed the role of progesterone therapy and the effect on preneoplastic lesions, believed to include induction of apoptosis in addition to tissue sloughing during withdrawal bleeding.  Activation of the progesterone receptors is believed to lead stromal decidualization and thinning of the lining.  We reviewed the 3 most studied options, to include levonorgesterol IUD (38mcg/d), oral medorxyprogesterone acetate 10mg  daily or cyclically 12-14 days per month, or oral megesterol acetate 40-200mg  per day.    She understands that all options have few side effects, most common being infrequent edema, GI disturbances, and thromboembolic  events), but that local progesterone through IUD may have a stronger effect on the endometrium with less systemic side effects.  Furthermore, studies demonstrate that 60-90% of women will have regression of their [name] with progesterone use.    For monitoring, she understands that there is no recommend standard of care.  We will base her surveillance on GOG 224 protocol.  This will include EMB at 3 months following initiation of treatment although if we sample her endometrium at the time of IUD placement and this comes back negative for any [name], I think it would be reasonable to delay first biopsy until 6 months as long as she does not have any vaginal bleeding or other symptoms.  EMBs will be performed at 3 to 6 month intervals, until a minimum of 3 negative biopsy results are obtained, after which sampling frequeny may then be yearly or until new abnormal uterine bleeding develops.  If persistence of [name] is noted by 9 months of treatment (or if progression to cancer seen), then we will discuss additional agents or fitness/readiness for surgery.    OBESITY:  We also reviewed the importance of weight loss in overall health as well as reduction in cancer risk.  I reviewed the role that obesity plays in production of excess estrogen.  We discussed possible referrals to bariatric surgery clinic or one of the weight loss clinics in town.  My suggestion was that the patient reach out to her primary provider about weight loss strategies and whether she would be a candidate for injectable weight loss/diabetes medication.  After our discussion, the patient would like to proceed with progesterone therapy with the goal of working towards weight loss and definitive surgery in the near future.  Her preference is to proceed with Mirena IUD placement to hopefully limit  any side effects related to progesterone therapy.  I suggest that we plan to do this in the operating room so that additional endometrial sampling can be  performed under hysteroscopic guidance.  Pelvic exam was deferred today given plan for upcoming surgery.  My office will send clearance forms to both her cardiologist as well as pulmonologist.  She happens to have a cardiology visit scheduled next week.  Clearance will be for upcoming procedure but will also mention in the clearance request that we may be proceeding with a hysterectomy in the next 6-9 months.  A copy of this note was sent to the patient's referring provider.   65 minutes of total time was spent for this patient encounter, including preparation, face-to-face counseling with the patient and coordination of care, and documentation of the encounter.  Eugene Garnet, MD  Division of Gynecologic Oncology  Department of Obstetrics and Gynecology  [facility]  ___________________________________________  Chief Complaint: Chief Complaint  Patient presents with   [name]    History of Present Illness:  Kerry [name] is a 73 y.o. y.o. female who is seen in consultation at the request of Dr. Kennith Center for an evaluation of recent biopsy showing at least [name] within a polyp.  Presented with PMB in 2023.  Pelvic ultrasound in 06/2021 showed multiple uterine and likely cervical leiomyoma, largest 3.6 cm.  08/2021: D&C hysteroscopy with benign polyp and submucosal fibroid sampling. No hyperplasia or malignancy. 11/22/22: EMB with scant atrophic endometrium. 02/06/23: SIS with filling defect:  31 x 10 mm, and thickened endometrium. EMB with benign endometrium.  03/21/23: Hysteroscopy, endometrial sampling. Findings of endometrial polyp.  Pathology - scanty inactive endometrium, benign endocervix. Endometrial polypectomy with [name] involving fragments of polyp, microscopic foci borderline on well-differentiated endometrioid adenocarcinoma.   Patient comes in with her husband today.  She notes overall doing well.  She had a small amount of bleeding after the  procedure that has since stopped.  She notes some passage of blood tinged mucus prior to and for about a week after surgery.  She reports normal bowel function.  Has overactive bladder at baseline that has improved with treatment.  She notes steady weight gain since her stent was placed at the beginning of last year.  The patient's family history is notable for cancer in her sister in her 35s.  This was treated surgically and did not require adjuvant chemotherapy.  Her sister is alive and doing very well.  The patient herself had Ambry germline genetic testing in February which was negative for pathogenic variants.  One of her grandmothers died related to a pulmonary embolism after hysterectomy -this contributed to her prior hesitation to have a hysterectomy.  Her surgical history is notable for repair of an umbilical hernia that was performed with mesh.  She thinks the mesh used was quite small.  PAST MEDICAL HISTORY:  Past Medical History:  Diagnosis Date   Abnormal uterine bleeding    Allergic contact dermatitis due to metals    Allergic rhinitis, cause unspecified    Bursitis of left shoulder    Chronic pain syndrome    Complication of anesthesia    03-20-2023  pt stated has issues with memory post op   Coronary artery disease 01/2022   cardiologist--- dr Lovina Reach;   positive PET NUC 01-24-2022;  cath 01-25-2022/  01-26-2022  multivessel CAD w/ severe global LV contractibility ef 55-60% LVEDP 30mm;    s/p  PTCA balloon mCX and OM2 and  DES x1 to OM1   Dyspareunia    Endometrial mass    Family history of ovarian cancer    Full dentures    GERD (gastroesophageal reflux disease)    improved   Hiatal hernia    History of adenomatous polyp of colon    History of cerebral infarction    neurologist--- dr Kirtland Bouchard. patel (lov note in epic 12-21-2021 for vertigo & HA)  MRI imaging incidental finding remote small vessal infarct right basal ganglia   History of esophageal stricture    s/p  EGD w/  dilatation 2010 and 03-12-2018   History of kidney stones    HTN (hypertension)    Hyperlipidemia    Interstitial lung disease (HCC)    followed by dr Craige Cotta ([facility] [place], [place])   long COVID related mild ILD with UIP pattern   Multiple pulmonary nodules    OA (osteoarthritis)    knees;  hands   OAB (overactive bladder)    OSA on CPAP    followed by pulmonology--- dr Craige Cotta;   HST in epic 03-04-2015  moderate osa   Polyarthralgia    Pre-diabetes    S/P drug eluting coronary stent placement 01/26/2022   PTCA balloon angioplasty and DES x1 toOM1   Somatic dysfunction    multiple sites;    lumbar & sacral regions;  ribs;   cervical spine   Urinary incontinence    Uterine fibroid      PAST SURGICAL HISTORY:  Past Surgical History:  Procedure Laterality Date   CARPAL TUNNEL RELEASE Bilateral    left 05-21-2020;   right  1998   CATARACT EXTRACTION W/ INTRAOCULAR LENS IMPLANT Bilateral 2018   COLONOSCOPY WITH ESOPHAGOGASTRODUODENOSCOPY (EGD)  [facility]   [facility] [place]   COLONOSCOPY WITH ESOPHAGOGASTRODUODENOSCOPY (EGD) AND ESOPHAGEAL DILATION (ED)  03/12/2018   @ [facility]   CORONARY STENT INTERVENTION N/A 01/26/2022   Procedure: CORONARY STENT INTERVENTION;  Surgeon: Kathleene Hazel, MD;  Location: [facility];  Service: Cardiovascular;  Laterality: N/A;   DILATATION & CURETTAGE/HYSTEROSCOPY WITH MYOSURE N/A 08/22/2021   Procedure: DILATATION & CURETTAGE/HYSTEROSCOPY WITH MYOSURE RESECTION OF ENDOMETRIAL MASS;  Surgeon: Patton Salles, MD;  Location: [facility];  Service: Gynecology;  Laterality: N/A;   DILATATION & CURETTAGE/HYSTEROSCOPY WITH MYOSURE N/A 03/21/2023   Procedure: DILATATION & CURETTAGE/HYSTEROSCOPY WITH MYOSURE;  Surgeon: Rosalyn Gess, MD;  Location: [facility];  Service: Gynecology;  Laterality: N/A;  [facility]   DILATION AND CURETTAGE OF UTERUS  2000   HAMMER TOE SURGERY Right 1990   LEFT HEART CATH AND  CORONARY ANGIOGRAPHY N/A 01/25/2022   Procedure: LEFT HEART CATH AND CORONARY ANGIOGRAPHY;  Surgeon: Lennette Bihari, MD;  Location: [facility];  Service: Cardiovascular;  Laterality: N/A;   NASAL SEPTUM SURGERY  2000   OPERATIVE ULTRASOUND N/A 08/22/2021   Procedure: OPERATIVE ULTRASOUND GUIDED HYSTEROSCOPY;  Surgeon: Patton Salles, MD;  Location: [facility];  Service: Gynecology;  Laterality: N/A;   PELVIC LAPAROSCOPY  1990   remembers pelvic pain, normal findings   ROTATOR CUFF REPAIR Left 11/11/2013   @ [facility]  by  Dr. Ave Filter   SHOULDER ARTHROSCOPY W/ ROTATOR CUFF REPAIR Right 10/09/2011   @[facility]  by Dr. Ave Filter   UMBILICAL HERNIA REPAIR  2010   Patient reports mesh was used for the repair.    OB/GYN HISTORY:  OB History  Gravida Para Term Preterm AB Living  2 2  2   2  SAB IAB Ectopic Multiple Live Births      2    # Outcome Date GA Lbr Len/2nd Weight Sex Type Anes PTL Lv  2 Term      Vag-Spont     1 Term      Vag-Forceps       Patient's last menstrual period was 08/16/1992.  Age at menarche: 1  Age at menopause: 51 Hx of HRT: denies Hx of STDs: denis Last pap: 2024 - NIML, HR HPV negative History of abnormal pap smears: Denies  SCREENING STUDIES:  Last mammogram: 2024  Last colonoscopy: 2020 (had EGD at same time)  MEDICATIONS: Outpatient Encounter Medications as of 04/12/2023  Medication Sig   amLODipine (NORVASC) 5 MG tablet Take 1 tablet (5 mg total) by mouth daily. (Patient taking differently: Take 5 mg by mouth daily after lunch.)   ascorbic acid (VITAMIN C) 250 MG tablet Take 250 mg by mouth daily.   aspirin EC 81 MG tablet Take 1 tablet (81 mg total) by mouth daily. Swallow whole. (Patient taking differently: Take 81 mg by mouth daily after lunch. Swallow whole.)   carboxymethylcellulose (REFRESH PLUS) 0.5 % SOLN Place 1 drop into both eyes 2 (two) times daily as needed (for dry eyes).   CINNAMON PO Take 2.5 mLs by mouth  daily. Pt says she uses in food. It's not a tablet or pill   Coenzyme Q10 200 MG capsule Take 200 mg by mouth daily after lunch.   cyanocobalamin (VITAMIN B12) 1000 MCG tablet Take 1,000 mcg by mouth every Monday, Wednesday, and Friday.   cyanocobalamin (VITAMIN B12) 1000 MCG/ML injection Inject 1,000 mcg into the muscle every 30 (thirty) days.   Evolocumab (REPATHA SURECLICK) 140 MG/ML SOAJ Inject 140 mg into the skin every 14 (fourteen) days.   FIBER ADULT GUMMIES PO Take 2-3 each by mouth 2 (two) times daily as needed (constipation).   furosemide (LASIX) 40 MG tablet Take 1 tablet (40 mg total) by mouth every other day.   metoprolol succinate (TOPROL-XL) 25 MG 24 hr tablet Take 25 mg by mouth daily after lunch.   Multiple Vitamins-Minerals (SUPER MULTI-VITAMIN PO) Take 1 capsule by mouth daily. Solgar Formula VM-75   nitroGLYCERIN (NITROSTAT) 0.4 MG SL tablet Place 1 tablet (0.4 mg total) under the tongue every 5 (five) minutes x 3 doses as needed for chest pain.   NON FORMULARY Take 3 fluid ounces by mouth daily. Goat Kefir   Omega-3 Fatty Acids (FISH OIL) 500 MG CAPS Take 500 mg by mouth daily.   potassium chloride SA (KLOR-CON M) 20 MEQ tablet Take 1 tablet (20 mEq total) by mouth every other day.   Probiotic Product (PROBIOTIC ADVANCED PO) Take 1 capsule by mouth daily. Patient states taking 5 billion, Suprema Dophilus probiotic   Sodium Chloride-Xylitol (XLEAR SINUS CARE SPRAY) SOLN Place 2 sprays into the nose at bedtime. Rinse only   TART CHERRY PO Take 1 tablet by mouth daily.   triamcinolone cream (KENALOG) 0.5 % Apply 1 Application topically 2 (two) times daily as needed.   TURMERIC-GINGER PO Take 1 capsule by mouth 2 (two) times daily.   VITAMIN D-VITAMIN K PO Take 1 capsule by mouth every other day. Vit d 5000 units and of K   No facility-administered encounter medications on file as of 04/12/2023.    ALLERGIES:  Allergies  Allergen Reactions   Meloxicam Other (See  Comments)    Made patient have stomach pain ,sore  throat, joint pains, face flush    Molds & Smuts Other (See Comments), Palpitations, Shortness Of Breath and Swelling   Cefixime Swelling   Celebrex [Celecoxib] Swelling   [place]alt Other (See Comments)    Per allergist   Gabapentin Other (See Comments)    Joint pain   Levofloxacin Other (See Comments)    Muscle and joint pain   Molybdenum Other (See Comments)    Per allergist   Other Other (See Comments)    TEQUIN. TEQUIN - JOINT AND MUSCLE PAIN Other Reaction: painful joints Tantal Metal   Penicillins Hives   Robaxin [Methocarbamol]    Statins Other (See Comments)    Muscle pain   Tomato Hives   Adhesive [Tape] Rash    Sensitive to EKG leads; sensitive to 59M Micropore tape   Dust Mite Extract Itching, Palpitations and Other (See Comments)   Tetracycline Hcl Rash    Can take Doxy   Tetracyclines & Related Rash     FAMILY HISTORY:  Family History  Problem Relation Age of Onset   Rheumatologic disease Mother    Diabetes Mother    Heart attack Father    Basal cell carcinoma Father    Rheumatologic disease Sister    Arthritis Sister        --Rheumatoid arthritis   Ovarian cancer Sister 47       hyst in 61s, surgery in 44s, no chemo, still alive   Crohn's disease Brother    Kidney failure Brother    Ulcerative colitis Brother    Glaucoma Brother    Cancer Niece 42       gyn   Breast cancer Neg Hx    Prostate cancer Neg Hx    Colon cancer Neg Hx    Pancreatic cancer Neg Hx    Endometrial cancer Neg Hx      SOCIAL HISTORY:  Social Connections: Not on file    REVIEW OF SYSTEMS:  + Increased appetite, fatigue, hearing loss, mouth sores, ringing in ears, cough, leg swelling, shortness of breath, abdominal pain, constipation, incontinence, vaginal bleeding, vaginal discharge, joint pain, back pain, muscle pain/cramps, itching, rash, dizziness, problems with walking, headache, easy bruising/bleeding, decreased  concentration. Denies fevers, chills, unexplained weight changes. Denies hearing loss, neck lumps or masses, mouth sores or voice changes. Denies wheezing. Denies chest pain or palpitations. Denies leg swelling. Denies abdominal distention, blood in stools, diarrhea, nausea, vomiting, or early satiety. Denies pain with intercourse, dysuria, frequency, hematuria. Denies hot flashes, pelvic pain or vaginal discharge.   Denies wounds. Denies dizziness, numbness or seizures. Denies swollen lymph nodes or glands Denies anxiety, depression, confusion.  Physical Exam:  Vital Signs for this encounter:  Blood pressure (!) 142/80, pulse 88, temperature 98.1 F (36.7 C), temperature source Oral, resp. rate 19, height 5' (1.524 m), weight 236 lb 3.2 oz (107.1 kg), last menstrual period 08/16/1992, SpO2 99%. Body mass index is 46.13 kg/m. General: Alert, oriented, no acute distress.  HEENT: Normocephalic, atraumatic. Sclera anicteric.  Chest: Clear to auscultation bilaterally. No wheezes, rhonchi, or rales. Cardiovascular: Regular rate and rhythm, no murmurs, rubs, or gallops.  Abdomen: Obese. Normoactive bowel sounds. Soft, nondistended, nontender to palpation. No masses or hepatosplenomegaly appreciated. No palpable fluid wave.  Extremities: Grossly normal range of motion. Warm, well perfused. No edema bilaterally.  Skin: No rashes or lesions.  Lymphatics: No cervical, supraclavicular, or inguinal adenopathy.  GU: Deferred.  LABORATORY AND RADIOLOGIC DATA:  Outside medical records were reviewed to synthesize the above  history, along with the history and physical obtained during the visit.   Lab Results  Component Value Date   WBC 8.0 01/27/2022   HGB 14.6 03/21/2023   HCT 43.0 03/21/2023   PLT 203 01/27/2022   GLUCOSE 113 (H) 03/21/2023   CHOL 134 03/12/2023   TRIG 153 (H) 03/12/2023   HDL 50 03/12/2023   LDLCALC 58 03/12/2023   ALT 17 11/23/2022   AST 18 11/23/2022   NA 142  03/21/2023   K 3.8 03/21/2023   CL 105 03/21/2023   CREATININE 0.60 03/21/2023   BUN 17 03/21/2023   CO2 24 01/03/2023   TSH 1.46 01/24/2017   HGBA1C 5.9 (H) 11/23/2022

## 2023-04-11 NOTE — H&P (View-Only) (Signed)
 GYNECOLOGIC ONCOLOGY NEW PATIENT CONSULTATION   Patient Name: Kerry [name]  Patient Age: 73 y.o. Date of Service: 04/12/23 Referring Provider: Dr. Darrell Jewel  Primary Care Provider: Street, [name], MD Consulting Provider: Eugene Garnet, MD   Assessment/Plan:  Postmenopausal patient with [name].  We reviewed the diagnosis of endometrial intraepithelial neoplasia ([name]) and the treatment options, including medical management (Mirena IUD or progesterone PO) or hysterectomy.  Recent biopsy showed [name] within fragments of a polyp.  There was some microscopic foci bordering on well-differentiated endometrial cancer but did not meet criteria.  Background endometrium with scant but negative for [name].  SURGERY: The patient is a suitable candidate for hysterectomy via a minimally invasive approach to surgery.  Given that she is postmenopausal, a bilateral salpingo-oophorectomy is also recommended.  We reviewed that robotic assistance would be used to complete the surgery.  We discussed that endometrial cancer is detected in about 40% of final uterine pathology specimens from patients with [name].  We discussed need for cardiac and pulmonary clearance given her medical comorbidities.  We also reviewed increased morbidity in the setting of her obesity.  MEDICAL: We reviewed the role of progesterone therapy and the effect on preneoplastic lesions, believed to include induction of apoptosis in addition to tissue sloughing during withdrawal bleeding.  Activation of the progesterone receptors is believed to lead stromal decidualization and thinning of the lining.  We reviewed the 3 most studied options, to include levonorgesterol IUD (78mcg/d), oral medorxyprogesterone acetate 10mg  daily or cyclically 12-14 days per month, or oral megesterol acetate 40-200mg  per day.    She understands that all options have few side effects, most common being infrequent edema, GI disturbances, and thromboembolic  events), but that local progesterone through IUD may have a stronger effect on the endometrium with less systemic side effects.  Furthermore, studies demonstrate that 60-90% of women will have regression of their [name] with progesterone use.    For monitoring, she understands that there is no recommend standard of care.  We will base her surveillance on GOG 224 protocol.  This will include EMB at 3 months following initiation of treatment although if we sample her endometrium at the time of IUD placement and this comes back negative for any [name], I think it would be reasonable to delay first biopsy until 6 months as long as she does not have any vaginal bleeding or other symptoms.  EMBs will be performed at 3 to 6 month intervals, until a minimum of 3 negative biopsy results are obtained, after which sampling frequeny may then be yearly or until new abnormal uterine bleeding develops.  If persistence of [name] is noted by 9 months of treatment (or if progression to cancer seen), then we will discuss additional agents or fitness/readiness for surgery.    OBESITY:  We also reviewed the importance of weight loss in overall health as well as reduction in cancer risk.  I reviewed the role that obesity plays in production of excess estrogen.  We discussed possible referrals to bariatric surgery clinic or one of the weight loss clinics in town.  My suggestion was that the patient reach out to her primary provider about weight loss strategies and whether she would be a candidate for injectable weight loss/diabetes medication.  After our discussion, the patient would like to proceed with progesterone therapy with the goal of working towards weight loss and definitive surgery in the near future.  Her preference is to proceed with Mirena IUD placement to hopefully limit  any side effects related to progesterone therapy.  I suggest that we plan to do this in the operating room so that additional endometrial sampling can be  performed under hysteroscopic guidance.  Pelvic exam was deferred today given plan for upcoming surgery.  My office will send clearance forms to both her cardiologist as well as pulmonologist.  She happens to have a cardiology visit scheduled next week.  Clearance will be for upcoming procedure but will also mention in the clearance request that we may be proceeding with a hysterectomy in the next 6-9 months.  A copy of this note was sent to the patient's referring provider.   65 minutes of total time was spent for this patient encounter, including preparation, face-to-face counseling with the patient and coordination of care, and documentation of the encounter.  Eugene Garnet, MD  Division of Gynecologic Oncology  Department of Obstetrics and Gynecology  [facility]  ___________________________________________  Chief Complaint: Chief Complaint  Patient presents with   [name]    History of Present Illness:  Kerry [name] is a 73 y.o. y.o. female who is seen in consultation at the request of Dr. Kennith Center for an evaluation of recent biopsy showing at least [name] within a polyp.  Presented with PMB in 2023.  Pelvic ultrasound in 06/2021 showed multiple uterine and likely cervical leiomyoma, largest 3.6 cm.  08/2021: D&C hysteroscopy with benign polyp and submucosal fibroid sampling. No hyperplasia or malignancy. 11/22/22: EMB with scant atrophic endometrium. 02/06/23: SIS with filling defect:  31 x 10 mm, and thickened endometrium. EMB with benign endometrium.  03/21/23: Hysteroscopy, endometrial sampling. Findings of endometrial polyp.  Pathology - scanty inactive endometrium, benign endocervix. Endometrial polypectomy with [name] involving fragments of polyp, microscopic foci borderline on well-differentiated endometrioid adenocarcinoma.   Patient comes in with her husband today.  She notes overall doing well.  She had a small amount of bleeding after the  procedure that has since stopped.  She notes some passage of blood tinged mucus prior to and for about a week after surgery.  She reports normal bowel function.  Has overactive bladder at baseline that has improved with treatment.  She notes steady weight gain since her stent was placed at the beginning of last year.  The patient's family history is notable for cancer in her sister in her 78s.  This was treated surgically and did not require adjuvant chemotherapy.  Her sister is alive and doing very well.  The patient herself had Ambry germline genetic testing in February which was negative for pathogenic variants.  One of her grandmothers died related to a pulmonary embolism after hysterectomy -this contributed to her prior hesitation to have a hysterectomy.  Her surgical history is notable for repair of an umbilical hernia that was performed with mesh.  She thinks the mesh used was quite small.  PAST MEDICAL HISTORY:  Past Medical History:  Diagnosis Date   Abnormal uterine bleeding    Allergic contact dermatitis due to metals    Allergic rhinitis, cause unspecified    Bursitis of left shoulder    Chronic pain syndrome    Complication of anesthesia    03-20-2023  pt stated has issues with memory post op   Coronary artery disease 01/2022   cardiologist--- dr Lovina Reach;   positive PET NUC 01-24-2022;  cath 01-25-2022/  01-26-2022  multivessel CAD w/ severe global LV contractibility ef 55-60% LVEDP 30mm;    s/p  PTCA balloon mCX and OM2 and  DES x1 to OM1   Dyspareunia    Endometrial mass    Family history of ovarian cancer    Full dentures    GERD (gastroesophageal reflux disease)    improved   Hiatal hernia    History of adenomatous polyp of colon    History of cerebral infarction    neurologist--- dr Kirtland Bouchard. patel (lov note in epic 12-21-2021 for vertigo & HA)  MRI imaging incidental finding remote small vessal infarct right basal ganglia   History of esophageal stricture    s/p  EGD w/  dilatation 2010 and 03-12-2018   History of kidney stones    HTN (hypertension)    Hyperlipidemia    Interstitial lung disease (HCC)    followed by dr Craige Cotta ([facility] [place], [place])   long COVID related mild ILD with UIP pattern   Multiple pulmonary nodules    OA (osteoarthritis)    knees;  hands   OAB (overactive bladder)    OSA on CPAP    followed by pulmonology--- dr Craige Cotta;   HST in epic 03-04-2015  moderate osa   Polyarthralgia    Pre-diabetes    S/P drug eluting coronary stent placement 01/26/2022   PTCA balloon angioplasty and DES x1 toOM1   Somatic dysfunction    multiple sites;    lumbar & sacral regions;  ribs;   cervical spine   Urinary incontinence    Uterine fibroid      PAST SURGICAL HISTORY:  Past Surgical History:  Procedure Laterality Date   CARPAL TUNNEL RELEASE Bilateral    left 05-21-2020;   right  1998   CATARACT EXTRACTION W/ INTRAOCULAR LENS IMPLANT Bilateral 2018   COLONOSCOPY WITH ESOPHAGOGASTRODUODENOSCOPY (EGD)  [facility] [place]   COLONOSCOPY WITH ESOPHAGOGASTRODUODENOSCOPY (EGD) AND ESOPHAGEAL DILATION (ED)  03/12/2018   @ [facility]   CORONARY STENT INTERVENTION N/A 01/26/2022   Procedure: CORONARY STENT INTERVENTION;  Surgeon: Kathleene Hazel, MD;  Location: [facility];  Service: Cardiovascular;  Laterality: N/A;   DILATATION & CURETTAGE/HYSTEROSCOPY WITH MYOSURE N/A 08/22/2021   Procedure: DILATATION & CURETTAGE/HYSTEROSCOPY WITH MYOSURE RESECTION OF ENDOMETRIAL MASS;  Surgeon: Patton Salles, MD;  Location: [facility];  Service: Gynecology;  Laterality: N/A;   DILATATION & CURETTAGE/HYSTEROSCOPY WITH MYOSURE N/A 03/21/2023   Procedure: DILATATION & CURETTAGE/HYSTEROSCOPY WITH MYOSURE;  Surgeon: Rosalyn Gess, MD;  Location: [facility] OR;  Service: Gynecology;  Laterality: N/A;  [facility]   DILATION AND CURETTAGE OF UTERUS  2000   HAMMER TOE SURGERY Right 1990   LEFT HEART CATH AND  CORONARY ANGIOGRAPHY N/A 01/25/2022   Procedure: LEFT HEART CATH AND CORONARY ANGIOGRAPHY;  Surgeon: Lennette Bihari, MD;  Location: [facility];  Service: Cardiovascular;  Laterality: N/A;   NASAL SEPTUM SURGERY  2000   OPERATIVE ULTRASOUND N/A 08/22/2021   Procedure: OPERATIVE ULTRASOUND GUIDED HYSTEROSCOPY;  Surgeon: Patton Salles, MD;  Location: [facility];  Service: Gynecology;  Laterality: N/A;   PELVIC LAPAROSCOPY  1990   remembers pelvic pain, normal findings   ROTATOR CUFF REPAIR Left 11/11/2013   @ [facility]  by  Dr. Ave Filter   SHOULDER ARTHROSCOPY W/ ROTATOR CUFF REPAIR Right 10/09/2011   @[facility]  by Dr. Ave Filter   UMBILICAL HERNIA REPAIR  2010   Patient reports mesh was used for the repair.    OB/GYN HISTORY:  OB History  Gravida Para Term Preterm AB Living  2 2  2   2  SAB IAB Ectopic Multiple Live Births      2    # Outcome Date GA Lbr Len/2nd Weight Sex Type Anes PTL Lv  2 Term      Vag-Spont     1 Term      Vag-Forceps       Patient's last menstrual period was 08/16/1992.  Age at menarche: 89  Age at menopause: 32 Hx of HRT: denies Hx of STDs: denis Last pap: 2024 - NIML, HR HPV negative History of abnormal pap smears: Denies  SCREENING STUDIES:  Last mammogram: 2024  Last colonoscopy: 2020 (had EGD at same time)  MEDICATIONS: Outpatient Encounter Medications as of 04/12/2023  Medication Sig   amLODipine (NORVASC) 5 MG tablet Take 1 tablet (5 mg total) by mouth daily. (Patient taking differently: Take 5 mg by mouth daily after lunch.)   ascorbic acid (VITAMIN C) 250 MG tablet Take 250 mg by mouth daily.   aspirin EC 81 MG tablet Take 1 tablet (81 mg total) by mouth daily. Swallow whole. (Patient taking differently: Take 81 mg by mouth daily after lunch. Swallow whole.)   carboxymethylcellulose (REFRESH PLUS) 0.5 % SOLN Place 1 drop into both eyes 2 (two) times daily as needed (for dry eyes).   CINNAMON PO Take 2.5 mLs by mouth  daily. Pt says she uses in food. It's not a tablet or pill   Coenzyme Q10 200 MG capsule Take 200 mg by mouth daily after lunch.   cyanocobalamin (VITAMIN B12) 1000 MCG tablet Take 1,000 mcg by mouth every Monday, Wednesday, and Friday.   cyanocobalamin (VITAMIN B12) 1000 MCG/ML injection Inject 1,000 mcg into the muscle every 30 (thirty) days.   Evolocumab (REPATHA SURECLICK) 140 MG/ML SOAJ Inject 140 mg into the skin every 14 (fourteen) days.   FIBER ADULT GUMMIES PO Take 2-3 each by mouth 2 (two) times daily as needed (constipation).   furosemide (LASIX) 40 MG tablet Take 1 tablet (40 mg total) by mouth every other day.   metoprolol succinate (TOPROL-XL) 25 MG 24 hr tablet Take 25 mg by mouth daily after lunch.   Multiple Vitamins-Minerals (SUPER MULTI-VITAMIN PO) Take 1 capsule by mouth daily. Solgar Formula VM-75   nitroGLYCERIN (NITROSTAT) 0.4 MG SL tablet Place 1 tablet (0.4 mg total) under the tongue every 5 (five) minutes x 3 doses as needed for chest pain.   NON FORMULARY Take 3 fluid ounces by mouth daily. Goat Kefir   Omega-3 Fatty Acids (FISH OIL) 500 MG CAPS Take 500 mg by mouth daily.   potassium chloride SA (KLOR-CON M) 20 MEQ tablet Take 1 tablet (20 mEq total) by mouth every other day.   Probiotic Product (PROBIOTIC ADVANCED PO) Take 1 capsule by mouth daily. Patient states taking 5 billion, Suprema Dophilus probiotic   Sodium Chloride-Xylitol (XLEAR SINUS CARE SPRAY) SOLN Place 2 sprays into the nose at bedtime. Rinse only   TART CHERRY PO Take 1 tablet by mouth daily.   triamcinolone cream (KENALOG) 0.5 % Apply 1 Application topically 2 (two) times daily as needed.   TURMERIC-GINGER PO Take 1 capsule by mouth 2 (two) times daily.   VITAMIN D-VITAMIN K PO Take 1 capsule by mouth every other day. Vit d 5000 units and of K   No facility-administered encounter medications on file as of 04/12/2023.    ALLERGIES:  Allergies  Allergen Reactions   Meloxicam Other (See  Comments)    Made patient have stomach pain ,sore  throat, joint pains, face flush    Molds & Smuts Other (See Comments), Palpitations, Shortness Of Breath and Swelling   Cefixime Swelling   Celebrex [Celecoxib] Swelling   Cobalt Other (See Comments)    Per allergist   Gabapentin Other (See Comments)    Joint pain   Levofloxacin Other (See Comments)    Muscle and joint pain   Molybdenum Other (See Comments)    Per allergist   Other Other (See Comments)    TEQUIN. TEQUIN - JOINT AND MUSCLE PAIN Other Reaction: painful joints Tantal Metal   Penicillins Hives   Robaxin [Methocarbamol]    Statins Other (See Comments)    Muscle pain   Tomato Hives   Adhesive [Tape] Rash    Sensitive to EKG leads; sensitive to 82M Micropore tape   Dust Mite Extract Itching, Palpitations and Other (See Comments)   Tetracycline Hcl Rash    Can take Doxy   Tetracyclines & Related Rash     FAMILY HISTORY:  Family History  Problem Relation Age of Onset   Rheumatologic disease Mother    Diabetes Mother    Heart attack Father    Basal cell carcinoma Father    Rheumatologic disease Sister    Arthritis Sister        --Rheumatoid arthritis   Ovarian cancer Sister 59       hyst in 108s, surgery in 65s, no chemo, still alive   Crohn's disease Brother    Kidney failure Brother    Ulcerative colitis Brother    Glaucoma Brother    Cancer Niece 68       gyn   Breast cancer Neg Hx    Prostate cancer Neg Hx    Colon cancer Neg Hx    Pancreatic cancer Neg Hx    Endometrial cancer Neg Hx      SOCIAL HISTORY:  Social Connections: Not on file    REVIEW OF SYSTEMS:  + Increased appetite, fatigue, hearing loss, mouth sores, ringing in ears, cough, leg swelling, shortness of breath, abdominal pain, constipation, incontinence, vaginal bleeding, vaginal discharge, joint pain, back pain, muscle pain/cramps, itching, rash, dizziness, problems with walking, headache, easy bruising/bleeding, decreased  concentration. Denies fevers, chills, unexplained weight changes. Denies hearing loss, neck lumps or masses, mouth sores or voice changes. Denies wheezing. Denies chest pain or palpitations. Denies leg swelling. Denies abdominal distention, blood in stools, diarrhea, nausea, vomiting, or early satiety. Denies pain with intercourse, dysuria, frequency, hematuria. Denies hot flashes, pelvic pain or vaginal discharge.   Denies wounds. Denies dizziness, numbness or seizures. Denies swollen lymph nodes or glands Denies anxiety, depression, confusion.  Physical Exam:  Vital Signs for this encounter:  Blood pressure (!) 142/80, pulse 88, temperature 98.1 F (36.7 C), temperature source Oral, resp. rate 19, height 5' (1.524 m), weight 236 lb 3.2 oz (107.1 kg), last menstrual period 08/16/1992, SpO2 99%. Body mass index is 46.13 kg/m. General: Alert, oriented, no acute distress.  HEENT: Normocephalic, atraumatic. Sclera anicteric.  Chest: Clear to auscultation bilaterally. No wheezes, rhonchi, or rales. Cardiovascular: Regular rate and rhythm, no murmurs, rubs, or gallops.  Abdomen: Obese. Normoactive bowel sounds. Soft, nondistended, nontender to palpation. No masses or hepatosplenomegaly appreciated. No palpable fluid wave.  Extremities: Grossly normal range of motion. Warm, well perfused. No edema bilaterally.  Skin: No rashes or lesions.  Lymphatics: No cervical, supraclavicular, or inguinal adenopathy.  GU: Deferred.  LABORATORY AND RADIOLOGIC DATA:  Outside medical records were reviewed to synthesize the above  history, along with the history and physical obtained during the visit.   Lab Results  Component Value Date   WBC 8.0 01/27/2022   HGB 14.6 03/21/2023   HCT 43.0 03/21/2023   PLT 203 01/27/2022   GLUCOSE 113 (H) 03/21/2023   CHOL 134 03/12/2023   TRIG 153 (H) 03/12/2023   HDL 50 03/12/2023   LDLCALC 58 03/12/2023   ALT 17 11/23/2022   AST 18 11/23/2022   NA 142  03/21/2023   K 3.8 03/21/2023   CL 105 03/21/2023   CREATININE 0.60 03/21/2023   BUN 17 03/21/2023   CO2 24 01/03/2023   TSH 1.46 01/24/2017   HGBA1C 5.9 (H) 11/23/2022

## 2023-04-12 ENCOUNTER — Telehealth: Payer: Self-pay | Admitting: *Deleted

## 2023-04-12 ENCOUNTER — Encounter: Payer: Self-pay | Admitting: Gynecologic Oncology

## 2023-04-12 ENCOUNTER — Inpatient Hospital Stay: Admitting: Gynecologic Oncology

## 2023-04-12 ENCOUNTER — Inpatient Hospital Stay: Attending: Gynecologic Oncology | Admitting: Gynecologic Oncology

## 2023-04-12 VITALS — BP 142/80 | HR 88 | Temp 98.1°F | Resp 19 | Ht 60.0 in | Wt 236.2 lb

## 2023-04-12 DIAGNOSIS — J849 Interstitial pulmonary disease, unspecified: Secondary | ICD-10-CM | POA: Diagnosis not present

## 2023-04-12 DIAGNOSIS — I2510 Atherosclerotic heart disease of native coronary artery without angina pectoris: Secondary | ICD-10-CM | POA: Diagnosis not present

## 2023-04-12 DIAGNOSIS — G894 Chronic pain syndrome: Secondary | ICD-10-CM | POA: Diagnosis not present

## 2023-04-12 DIAGNOSIS — N8502 Endometrial intraepithelial neoplasia [EIN]: Secondary | ICD-10-CM

## 2023-04-12 DIAGNOSIS — Z955 Presence of coronary angioplasty implant and graft: Secondary | ICD-10-CM | POA: Insufficient documentation

## 2023-04-12 DIAGNOSIS — E785 Hyperlipidemia, unspecified: Secondary | ICD-10-CM | POA: Diagnosis not present

## 2023-04-12 DIAGNOSIS — Z6841 Body Mass Index (BMI) 40.0 and over, adult: Secondary | ICD-10-CM | POA: Insufficient documentation

## 2023-04-12 DIAGNOSIS — Z8041 Family history of malignant neoplasm of ovary: Secondary | ICD-10-CM | POA: Diagnosis not present

## 2023-04-12 DIAGNOSIS — Z79899 Other long term (current) drug therapy: Secondary | ICD-10-CM | POA: Diagnosis not present

## 2023-04-12 DIAGNOSIS — I10 Essential (primary) hypertension: Secondary | ICD-10-CM | POA: Diagnosis not present

## 2023-04-12 DIAGNOSIS — K219 Gastro-esophageal reflux disease without esophagitis: Secondary | ICD-10-CM | POA: Insufficient documentation

## 2023-04-12 DIAGNOSIS — Z803 Family history of malignant neoplasm of breast: Secondary | ICD-10-CM | POA: Insufficient documentation

## 2023-04-12 DIAGNOSIS — N950 Postmenopausal bleeding: Secondary | ICD-10-CM | POA: Insufficient documentation

## 2023-04-12 DIAGNOSIS — G4733 Obstructive sleep apnea (adult) (pediatric): Secondary | ICD-10-CM

## 2023-04-12 DIAGNOSIS — Z7982 Long term (current) use of aspirin: Secondary | ICD-10-CM | POA: Diagnosis not present

## 2023-04-12 NOTE — Telephone Encounter (Signed)
 Update: Received a fax that from [facility] that patient's appointment is scheduled for 04/25/23. I have faxed over latest office visit from when patient saw Edd Fabian, PA-C on  03/14/23 and see patient see Dr. Tenny Craw on 04/17/23.

## 2023-04-12 NOTE — Telephone Encounter (Signed)
 Per Dr Pricilla Holm fax records and surgical optimization form to the patient's cardiology (Dr Tenny Craw [phone]) and pulmonary (Dr Stood [phone])  office.

## 2023-04-12 NOTE — Patient Instructions (Addendum)
 Preparing for your Surgery   Plan for surgery on April 25, 2023 with Dr. Eugene Garnet at [facility]. You will be scheduled for dilation and curettage of the uterus with hysteroscopy and myosure with mirena IUD (intrauterine device) placement.    Pre-operative Testing -You will receive a phone call from presurgical testing at [facility] to discuss surgery instructions and arrange for lab work if needed.   -Bring your insurance card, copy of an advanced directive if applicable, medication list.   -You can keep taking your baby aspirin with the last dose being the morning on the day BEFORE surgery. Do not take the morning of.   -Do not take supplements such as fish oil (omega 3), red yeast rice, turmeric before your surgery. You want to avoid medications with aspirin in them including headache powders such as BC or Goody's), Excedrin migraine. Please hold your supplements 7 days before surgery.   Day Before Surgery at Home -You will be advised you can have clear liquids up until 3 hours before your surgery.     Your role in recovery Your role is to become active as soon as directed by your doctor, while still giving yourself time to heal.  Rest when you feel tired. You will be asked to do the following in order to speed your recovery:   - Cough and breathe deeply. This helps to clear and expand your lungs and can prevent pneumonia after surgery.  - STAY ACTIVE WHEN YOU GET HOME. Do mild physical activity. Walking or moving your legs help your circulation and body functions return to normal. Do not try to get up or walk alone the first time after surgery.   -If you develop swelling on one leg or the other, pain in the back of your leg, redness/warmth in one of your legs, please call the office or go to the Emergency Room to have a doppler to rule out a blood clot. For shortness of breath, chest pain-seek care in the Emergency Room as soon as possible. - Actively manage your  pain. Managing your pain lets you move in comfort. We will ask you to rate your pain on a scale of zero to 10. It is your responsibility to tell your doctor or nurse where and how much you hurt so your pain can be treated.   Special Considerations -Your final pathology results from surgery should be available around one week after surgery and the results will be relayed to you when available.   -FMLA forms can be faxed to [phone] and please allow 5-7 business days for completion.   Pain Management After Surgery -Make sure that you have Tylenol and Ibuprofen at home IF YOU ARE ABLE TO TAKE THESE MEDICATIONS to use on a regular basis after surgery for pain control. We recommend alternating the medications every hour to six hours since they work differently and are processed in the body differently for pain relief.   Bowel Regimen -You will be prescribed Sennakot-S to take nightly to prevent constipation especially if you are taking the narcotic pain medication intermittently.  It is important to prevent constipation and drink adequate amounts of liquids. You can stop taking this medication when you are not taking pain medication and you are back on your normal bowel routine.   Risks of Surgery Risks of surgery are low but include bleeding, infection, damage to surrounding structures, re-operation, blood clots, and very rarely death.   AFTER SURGERY INSTRUCTIONS   Return to  work:  1-2 days if applicable   Activity: 1. Be up and out of the bed during the day.  Take a nap if needed.  You may walk up steps but be careful and use the hand rail.  Stair climbing will tire you more than you think, you may need to stop part way and rest.    2. No lifting or straining for 2 weeks over 10 pounds. No pushing, pulling, straining for 2 weeks.   3. No driving for minimum 24 hours after surgery.  Do not drive if you are taking narcotic pain medicine and make sure that your reaction time has returned.     4. You can shower as soon as the next day after surgery. Shower daily. No tub baths or submerging your body in water until cleared by your surgeon (2 weeks). If you have the soap that was given to you by pre-surgical testing that was used before surgery, you do not need to use it afterwards because this can irritate your incisions.    5. No sexual activity and nothing in the vagina for 2 weeks.   6. You may experience vaginal spotting and discharge after surgery.  The spotting is normal but if you experience heavy bleeding, call our office.   7. Take Tylenol or ibuprofen for pain if you are able to take these medications.  Monitor your Tylenol intake to a max of 4,000 mg in a 24 hour period.    Diet: 1. Low sodium Heart Healthy Diet is recommended but you are cleared to resume your normal (before surgery) diet after your procedure.   2. It is safe to use a laxative, such as Miralax or Colace, if you have difficulty moving your bowels.    Wound Care: 1. Keep clean and dry.  Shower daily.   Reasons to call the Doctor: Fever - Oral temperature greater than 100.4 degrees Fahrenheit Foul-smelling vaginal discharge Difficulty urinating Nausea and vomiting Increased pain at the site of the incision that is unrelieved with pain medicine. Difficulty breathing with or without chest pain New calf pain especially if only on one side Sudden, continuing increased vaginal bleeding with or without clots.   Contacts: For questions or concerns you should contact:   Dr. Eugene Garnet at [phone]   Warner Mccreedy, NP at [phone]   After Hours: call [phone] and have the GYN Oncologist paged/contacted (after 5 pm or on the weekends).   Messages sent via mychart are for non-urgent matters and are not responded to after hours so for urgent needs, please call the after hours number.

## 2023-04-12 NOTE — Progress Notes (Signed)
 Patient here for new patient consultation with Dr. Pricilla Holm and for a pre-operative appointment prior to her scheduled surgery on 04/25/2023. She is scheduled for dilation and curettage of the uterus with hysteroscopy and myosure with mirena IUD (intrauterine device) placement.     Discussed post-op pain management in detail including the aspects of the enhanced recovery pathway. We discussed the use of tylenol post-op and to monitor for a maximum of 4,000 mg in a 24 hour period. Patient avoids NSAID use due to history. Discussed laxative use if needed.    Discussed measures to take at home to prevent DVT including frequent mobility.  Reportable signs and symptoms of DVT discussed. Post-operative instructions discussed and expectations for after surgery.     10 minutes spent preparing information and with the patient.  Verbalizing understanding of material discussed. No needs or concerns voiced at the end of the visit.   Advised patient to call for any needs.    This appointment is included in the global surgical bundle as pre-operative teaching and has no charge.

## 2023-04-12 NOTE — Patient Instructions (Signed)
 Preparing for your Surgery   Plan for surgery on April 25, 2023 with Dr. Eugene Garnet at [facility]. You will be scheduled for dilation and curettage of the uterus with hysteroscopy and myosure with mirena IUD (intrauterine device) placement.    Pre-operative Testing -You will receive a phone call from presurgical testing at [facility] to discuss surgery instructions and arrange for lab work if needed.   -Bring your insurance card, copy of an advanced directive if applicable, medication list.   -You can keep taking your baby aspirin with the last dose being the morning on the day BEFORE surgery. Do not take the morning of.   -Do not take supplements such as fish oil (omega 3), red yeast rice, turmeric before your surgery. You want to avoid medications with aspirin in them including headache powders such as BC or Goody's), Excedrin migraine. Please hold your supplements 7 days before surgery.   Day Before Surgery at Home -You will be advised you can have clear liquids up until 3 hours before your surgery.     Your role in recovery Your role is to become active as soon as directed by your doctor, while still giving yourself time to heal.  Rest when you feel tired. You will be asked to do the following in order to speed your recovery:   - Cough and breathe deeply. This helps to clear and expand your lungs and can prevent pneumonia after surgery.  - STAY ACTIVE WHEN YOU GET HOME. Do mild physical activity. Walking or moving your legs help your circulation and body functions return to normal. Do not try to get up or walk alone the first time after surgery.   -If you develop swelling on one leg or the other, pain in the back of your leg, redness/warmth in one of your legs, please call the office or go to the Emergency Room to have a doppler to rule out a blood clot. For shortness of breath, chest pain-seek care in the Emergency Room as soon as possible. - Actively manage your  pain. Managing your pain lets you move in comfort. We will ask you to rate your pain on a scale of zero to 10. It is your responsibility to tell your doctor or nurse where and how much you hurt so your pain can be treated.   Special Considerations -Your final pathology results from surgery should be available around one week after surgery and the results will be relayed to you when available.   -FMLA forms can be faxed to [phone] and please allow 5-7 business days for completion.   Pain Management After Surgery -Make sure that you have Tylenol and Ibuprofen at home IF YOU ARE ABLE TO TAKE THESE MEDICATIONS to use on a regular basis after surgery for pain control. We recommend alternating the medications every hour to six hours since they work differently and are processed in the body differently for pain relief.   Bowel Regimen -You will be prescribed Sennakot-S to take nightly to prevent constipation especially if you are taking the narcotic pain medication intermittently.  It is important to prevent constipation and drink adequate amounts of liquids. You can stop taking this medication when you are not taking pain medication and you are back on your normal bowel routine.   Risks of Surgery Risks of surgery are low but include bleeding, infection, damage to surrounding structures, re-operation, blood clots, and very rarely death.   AFTER SURGERY INSTRUCTIONS   Return to  work:  1-2 days if applicable   Activity: 1. Be up and out of the bed during the day.  Take a nap if needed.  You may walk up steps but be careful and use the hand rail.  Stair climbing will tire you more than you think, you may need to stop part way and rest.    2. No lifting or straining for 2 weeks over 10 pounds. No pushing, pulling, straining for 2 weeks.   3. No driving for minimum 24 hours after surgery.  Do not drive if you are taking narcotic pain medicine and make sure that your reaction time has returned.     4. You can shower as soon as the next day after surgery. Shower daily. No tub baths or submerging your body in water until cleared by your surgeon (2 weeks). If you have the soap that was given to you by pre-surgical testing that was used before surgery, you do not need to use it afterwards because this can irritate your incisions.    5. No sexual activity and nothing in the vagina for 2 weeks.   6. You may experience vaginal spotting and discharge after surgery.  The spotting is normal but if you experience heavy bleeding, call our office.   7. Take Tylenol or ibuprofen for pain if you are able to take these medications.  Monitor your Tylenol intake to a max of 4,000 mg in a 24 hour period.    Diet: 1. Low sodium Heart Healthy Diet is recommended but you are cleared to resume your normal (before surgery) diet after your procedure.   2. It is safe to use a laxative, such as Miralax or Colace, if you have difficulty moving your bowels.    Wound Care: 1. Keep clean and dry.  Shower daily.   Reasons to call the Doctor: Fever - Oral temperature greater than 100.4 degrees Fahrenheit Foul-smelling vaginal discharge Difficulty urinating Nausea and vomiting Increased pain at the site of the incision that is unrelieved with pain medicine. Difficulty breathing with or without chest pain New calf pain especially if only on one side Sudden, continuing increased vaginal bleeding with or without clots.   Contacts: For questions or concerns you should contact:   Dr. Eugene Garnet at [phone]   Warner Mccreedy, NP at [phone]   After Hours: call [phone] and have the GYN Oncologist paged/contacted (after 5 pm or on the weekends).   Messages sent via mychart are for non-urgent matters and are not responded to after hours so for urgent needs, please call the after hours number.

## 2023-04-13 NOTE — Telephone Encounter (Signed)
Received pulmonary clearance

## 2023-04-16 ENCOUNTER — Encounter: Payer: Self-pay | Admitting: Internal Medicine

## 2023-04-17 ENCOUNTER — Encounter: Payer: Self-pay | Admitting: Gynecologic Oncology

## 2023-04-17 ENCOUNTER — Telehealth: Payer: Self-pay

## 2023-04-17 ENCOUNTER — Ambulatory Visit: Payer: Medicare Other | Admitting: Internal Medicine

## 2023-04-17 NOTE — Telephone Encounter (Signed)
 Per Warner Mccreedy NP, I reached out to [facility] pharmacy. (See message below)   Per Mariane Baumgarten IUD has  Nickel: (jewlery sensitivity) Barium (radiopaque to see on X-ray) Salt not metal  Pt is aware, via phone call

## 2023-04-17 NOTE — Telephone Encounter (Signed)
-----   Message from Doylene Bode sent at 04/17/2023 10:47 AM EDT ----- In regards to her mychart message:  1) Call the inpatient pharmacy at [facility] at [phone] or [facility]. Let them know the patient is scheduled for mirena IUD placement intra-op. Can they identify the metal used in the IUD and compare to her allergy list. According to pt, she had a reaction to the cardiac stent placed in Jan 2024.

## 2023-04-18 ENCOUNTER — Telehealth: Payer: Self-pay | Admitting: Internal Medicine

## 2023-04-18 NOTE — Telephone Encounter (Signed)
 I will fax notes to the surgeon office pt has been cleared and ok to hold ASA 5-7 days prior.

## 2023-04-18 NOTE — Telephone Encounter (Signed)
 [facility] is calling checking on pre-op clearance

## 2023-04-18 NOTE — Telephone Encounter (Signed)
 I will forward to preop APP to review if pt has been cleared. I did read notes from Edd Fabian, FNP that clears the pt; however the pt is on ASA which needed to be held per request. Jari Favre, [facility] also documented like she has cleared the pt and commented about ASA hold. I will forward to preop APP for clarification if pt has been cleared and ok to hold ASA.

## 2023-04-19 NOTE — Progress Notes (Signed)
 COVID Vaccine received:  []  No [x]  Yes Date of any COVID positive Test in last 90 days:  PCP - Maryjean Ka, MD  Cardiologist - Dietrich Pates, MD   Edd Fabian, NP  Cardiac clearance in 03-14-23 Epic note Pulmonary- Dr. Coralyn Helling [phone] (Work)  [phone] (Fax)   Chest x-ray - 02-07-2023   2v   CEW EKG -  03-14-2023  Epic Stress Test -  ECHO - 04-08-2021  Epic Cardiac Cath - 01-26-22 [facility]- DES x1   by Dr. Clifton James  PCR screen: []  Ordered & Completed []   No Order but Needs PROFEND     [x]   N/A for this surgery  Surgery Plan:  [x]  Ambulatory   []  Outpatient in bed  []  Admit Anesthesia:    [x]  General  []  Spinal  []   Choice []   MAC  Pacemaker / ICD device [x]  No []  Yes   Spinal Cord Stimulator:[x]  No []  Yes       History of Sleep Apnea? []  No [x]  Yes   CPAP used?- []  No [x]  Yes    Does the patient monitor blood sugar?   []  N/A   [x]  No []  Yes  Patient has: []  NO Hx DM   [x]  Pre-DM   []  DM1  []   DM2 Last A1c was:5.9   on    11-23-2022    No Meds   Blood Thinner / Instructions:None Aspirin Instructions:   ASA 81 mg  ERAS Protocol Ordered: []  No  [x]  Yes PRE-SURGERY []  ENSURE  []  G2   [x]  No Drink Ordered Patient is to be NPO after: 0600  Dental hx: []  Dentures:  []  N/A      []  Bridge or Partial:                   []  Loose or Damaged teeth:   Comments:   Activity level: Patient is able / unable to climb a flight of stairs without difficulty; []  No CP  []  No SOB, but would have ___   Patient can / can not perform ADLs without assistance.   Anesthesia review: CAD, CM, Pre-DM, OSA- CPAP, GERD,  HTN, TMJ, had memory problems after 03-20-23 surgery.   Patient denies shortness of breath, fever, cough and chest pain at PAT appointment.  Patient verbalized understanding and agreement to the Pre-Surgical Instructions that were given to them at this PAT appointment. Patient was also educated of the need to review these PAT instructions again prior to her surgery.I reviewed the  appropriate phone numbers to call if they have any and questions or concerns.

## 2023-04-19 NOTE — Patient Instructions (Signed)
 SURGICAL WAITING ROOM VISITATION Patients having surgery or a procedure may have no more than 2 support people in the waiting area - these visitors may rotate in the visitor waiting room.   Due to an increase in RSV and influenza rates and associated hospitalizations, children ages 33 and under may not visit patients in [facility] hospitals. If the patient needs to stay at the hospital during part of their recovery, the visitor guidelines for inpatient rooms apply.  PRE-OP VISITATION  Pre-op nurse will coordinate an appropriate time for 1 support person to accompany the patient in pre-op.  This support person may not rotate.  This visitor will be contacted when the time is appropriate for the visitor to come back in the pre-op area.  Please refer to the [facility] website for the visitor guidelines for Inpatients (after your surgery is over and you are in a regular room).  You are not required to quarantine at this time prior to your surgery. However, you must do this: Hand Hygiene often Do NOT share personal items Notify your provider if you are in close contact with someone who has COVID or you develop fever 100.4 or greater, new onset of sneezing, cough, sore throat, shortness of breath or body aches.  If you test positive for Covid or have been in contact with anyone that has tested positive in the last 10 days please notify you surgeon.    Your procedure is scheduled on:  [facility]  April 25, 2023  Report to [facility] Main Entrance: [address]sen entrance where the [employer] is available.   Report to admitting at: 06:45 AM  Call this number if you have any questions or problems the morning of surgery [phone]  FOLLOW ANY ADDITIONAL PRE OP INSTRUCTIONS YOU RECEIVED FROM YOUR SURGEON'S OFFICE!!!  Do not eat food after Midnight the night prior to your surgery/procedure.  After Midnight you may have the following liquids until  06:00 AM DAY OF SURGERY  Clear Liquid  Diet Water Black Coffee (sugar ok, NO MILK/CREAM OR CREAMERS)  Tea (sugar ok, NO MILK/CREAM OR CREAMERS) regular and decaf                             Plain Jell-O  with no fruit (NO RED)                                           Fruit ices (not with fruit pulp, NO RED)                                     Popsicles (NO RED)                                                                  Juice: NO CITRUS JUICES: only apple, WHITE grape, WHITE cranberry Sports drinks like Gatorade or Powerade (NO RED)                 Oral Hygiene is also important to reduce your risk of infection.  Remember - BRUSH YOUR TEETH THE MORNING OF SURGERY WITH YOUR REGULAR TOOTHPASTE  Do NOT smoke after Midnight the night before surgery.  STOP TAKING all Vitamins, Herbs and supplements 1 week before your surgery.   Take ONLY these medicines the morning of surgery with A SIP OF WATER: amlodipine, metoprolol. You may use your Eye Drops if needed.  If You have been diagnosed with Sleep Apnea - Bring CPAP mask and tubing day of surgery. We will provide you with a CPAP machine on the day of your surgery.                   You may not have any metal on your body including hair pins, jewelry, and body piercing  Do not wear make-up, lotions, powders, perfumes  or deodorant  Do not wear nail polish including gel and S&S, artificial / acrylic nails, or any other type of covering on natural nails including finger and toenails. If you have artificial nails, gel coating, etc., that needs to be removed by a nail salon, Please have this removed prior to surgery. Not doing so may mean that your surgery could be cancelled or delayed if the Surgeon or anesthesia staff feels like they are unable to monitor you safely.   Do not shave 48 hours prior to surgery to avoid nicks in your skin which may contribute to postoperative infections.   Contacts, Hearing Aids, dentures or bridgework may not be worn into surgery. DENTURES  WILL BE REMOVED PRIOR TO SURGERY PLEASE DO NOT APPLY "Poly grip" OR ADHESIVES!!!  Patients discharged on the day of surgery will not be allowed to drive home.  Someone NEEDS to stay with you for the first 24 hours after anesthesia.  Do not bring your home medications to the hospital. The Pharmacy will dispense medications listed on your medication list to you during your admission in the Hospital.  Please read over the following fact sheets you were given: IF YOU HAVE QUESTIONS ABOUT YOUR PRE-OP INSTRUCTIONS, PLEASE CALL [phone]   [facility] - Preparing for Surgery Before surgery, you can play an important role.  Because skin is not sterile, your skin needs to be as free of germs as possible.  You can reduce the number of germs on your skin by washing with CHG (chlorahexidine gluconate) soap before surgery.  CHG is an antiseptic cleaner which kills germs and bonds with the skin to continue killing germs even after washing. Please DO NOT use if you have an allergy to CHG or antibacterial soaps.  If your skin becomes reddened/irritated stop using the CHG and inform your nurse when you arrive at Short Stay. Do not shave (including legs and underarms) for at least 48 hours prior to the first CHG shower.  You may shave your face/neck.  Please follow these instructions carefully:  1.  Shower with CHG Soap the night before surgery and the  morning of surgery.  2.  If you choose to wash your hair, wash your hair first as usual with your normal  shampoo.  3.  After you shampoo, rinse your hair and body thoroughly to remove the shampoo.                             4.  Use CHG as you would any other liquid soap.  You can apply chg directly to the skin and wash.  Gently with a scrungie or clean washcloth.  5.  Apply the CHG Soap to your body ONLY FROM THE NECK DOWN.   Do not use on face/ open                           Wound or open sores. Avoid contact with eyes, ears mouth and genitals (private  parts).                       Wash face,  Genitals (private parts) with your normal soap.             6.  Wash thoroughly, paying special attention to the area where your  surgery  will be performed.  7.  Thoroughly rinse your body with warm water from the neck down.  8.  DO NOT shower/wash with your normal soap after using and rinsing off the CHG Soap.            9.  Pat yourself dry with a clean towel.            10.  Wear clean pajamas.            11.  Place clean sheets on your bed the night of your first shower and do not  sleep with pets.  ON THE DAY OF SURGERY : Do not apply any lotions/deodorants the morning of surgery.  Please wear clean clothes to the hospital/surgery center.     FAILURE TO FOLLOW THESE INSTRUCTIONS MAY RESULT IN THE CANCELLATION OF YOUR SURGERY  PATIENT SIGNATURE_________________________________  NURSE SIGNATURE__________________________________  ________________________________________________________________________

## 2023-04-20 ENCOUNTER — Encounter (HOSPITAL_COMMUNITY): Payer: Self-pay

## 2023-04-20 ENCOUNTER — Encounter (HOSPITAL_COMMUNITY)
Admission: RE | Admit: 2023-04-20 | Discharge: 2023-04-20 | Disposition: A | Discharge status: Home / Self Care | Source: Ambulatory Visit | Attending: Gynecologic Oncology | Admitting: Gynecologic Oncology

## 2023-04-20 ENCOUNTER — Other Ambulatory Visit: Payer: Self-pay

## 2023-04-20 VITALS — BP 167/63 | HR 86 | Temp 98.7°F | Resp 20 | Ht 60.0 in | Wt 236.1 lb

## 2023-04-20 DIAGNOSIS — Z6841 Body Mass Index (BMI) 40.0 and over, adult: Secondary | ICD-10-CM | POA: Diagnosis not present

## 2023-04-20 DIAGNOSIS — Z79899 Other long term (current) drug therapy: Secondary | ICD-10-CM | POA: Insufficient documentation

## 2023-04-20 DIAGNOSIS — Z8673 Personal history of transient ischemic attack (TIA), and cerebral infarction without residual deficits: Secondary | ICD-10-CM | POA: Diagnosis not present

## 2023-04-20 DIAGNOSIS — N8502 Endometrial intraepithelial neoplasia [EIN]: Secondary | ICD-10-CM | POA: Diagnosis not present

## 2023-04-20 DIAGNOSIS — I2510 Atherosclerotic heart disease of native coronary artery without angina pectoris: Secondary | ICD-10-CM | POA: Diagnosis not present

## 2023-04-20 DIAGNOSIS — Z9861 Coronary angioplasty status: Secondary | ICD-10-CM | POA: Insufficient documentation

## 2023-04-20 DIAGNOSIS — I10 Essential (primary) hypertension: Secondary | ICD-10-CM | POA: Insufficient documentation

## 2023-04-20 DIAGNOSIS — I3481 Nonrheumatic mitral (valve) annulus calcification: Secondary | ICD-10-CM | POA: Diagnosis not present

## 2023-04-20 DIAGNOSIS — Z01812 Encounter for preprocedural laboratory examination: Secondary | ICD-10-CM | POA: Diagnosis not present

## 2023-04-20 DIAGNOSIS — G4733 Obstructive sleep apnea (adult) (pediatric): Secondary | ICD-10-CM | POA: Diagnosis not present

## 2023-04-20 DIAGNOSIS — E669 Obesity, unspecified: Secondary | ICD-10-CM | POA: Insufficient documentation

## 2023-04-20 DIAGNOSIS — Z01818 Encounter for other preprocedural examination: Secondary | ICD-10-CM

## 2023-04-20 HISTORY — DX: Myoneural disorder, unspecified: G70.9

## 2023-04-20 LAB — CBC
HCT: 44.8 % (ref 36.0–46.0)
Hemoglobin: 14.7 g/dL (ref 12.0–15.0)
MCH: 29.6 pg (ref 26.0–34.0)
MCHC: 32.8 g/dL (ref 30.0–36.0)
MCV: 90.1 fL (ref 80.0–100.0)
Platelets: 213 10*3/uL (ref 150–400)
RBC: 4.97 MIL/uL (ref 3.87–5.11)
RDW: 14.3 % (ref 11.5–15.5)
WBC: 8.1 10*3/uL (ref 4.0–10.5)
nRBC: 0 % (ref 0.0–0.2)

## 2023-04-20 LAB — BASIC METABOLIC PANEL WITH GFR
Anion gap: 9 (ref 5–15)
BUN: 23 mg/dL (ref 8–23)
CO2: 25 mmol/L (ref 22–32)
Calcium: 8.9 mg/dL (ref 8.9–10.3)
Chloride: 106 mmol/L (ref 98–111)
Creatinine, Ser: 0.69 mg/dL (ref 0.44–1.00)
GFR, Estimated: >60 mL/min (ref >60)
Glucose, Bld: 109 mg/dL — ABNORMAL HIGH (ref 70–99)
Potassium: 3.9 mmol/L (ref 3.5–5.1)
Sodium: 140 mmol/L (ref 135–145)

## 2023-04-23 NOTE — Progress Notes (Signed)
 Anesthesia Chart Review:  73 year old female follows with cardiology for history of HTN, HLD, CAD s/p PTCA/DES to OM1 and balloon angioplasty to the mid circumflex/OM 2 on 01/16/2022.  LVEF was 55 to 60% at the time of cath.  Cardiac clearance progress note 03/14/2023 by Edd Fabian, NP, "Chart reviewed as part of pre-operative protocol coverage. Given past medical history and time since last visit, based on ACC/AHA guidelines, Salote Weidmann would be at acceptable risk for the planned procedure without further cardiovascular testing. Her RCRI is moderate risk, 6.6% risk of major cardiac event.  She is able to complete greater than 4 METS of physical activity. Patient was advised that if she develops new symptoms prior to surgery to contact our office to arrange a follow-up appointment.  He verbalized understanding."  Patient did subsequently undergo D&C/hysteroscopy with MyoSure on 03/21/2023 without complication.   Follows with pulmonology for history of OSA on CPAP, mild ILD with UIP pattern (PFTs normal).  She uses Symbicort as needed for cough/wheezing/chest congestion.  Other pertinent history includes GERD, TMJ, TIA, obesity BMI 46.  Preop labs reviewed, WNL.  EKG 03/14/2023: Normal sinus rhythm. Rate 89. Minimal voltage criteria for LVH, may be normal variant. Septal infarct , age undetermined  Cath/PCI 01/26/2022:    Mid Cx lesion is 80% stenosed.   Prox Cx lesion is 20% stenosed.   2nd Diag lesion is 60% stenosed.   3rd Mrg lesion is 90% stenosed.   A drug-eluting stent was successfully placed using a SYNERGY XD 2.25X20.   Balloon angioplasty was performed using a BALLN SAPPHIRE 2.0X12.   Post intervention, there is a 0% residual stenosis.   Post intervention, there is a 60% residual stenosis.   Severe stenosis in the mid Circumflex affecting flow into the first obtuse marginal branch and the second obtuse marginal branch.  Successful PTCA/DES x 1 OM1 (Synergy stent platform as  felt to be the most favorable given her metal allergy profile) Successful balloon angioplasty mid Circumflex/OM2   Recommendations: DAPT with ASA and Plavix for at least six months.   TTE 04/08/2021: 1. Left ventricular ejection fraction, by estimation, is 60 to 65%. The  left ventricle has normal function. The left ventricle has no regional  wall motion abnormalities. There is moderate left ventricular hypertrophy.  Left ventricular diastolic  parameters are indeterminate.   2. Right ventricular systolic function is normal. The right ventricular  size is normal. Tricuspid regurgitation signal is inadequate for assessing  PA pressure.   3. Left atrial size was mildly dilated.   4. No evidence of mitral valve regurgitation. Moderate mitral annular  calcification.   5. Aortic valve regurgitation is not visualized. Aortic valve  sclerosis/calcification is present, without any evidence of aortic  stenosis.      Zannie Cove [facility] Short Stay Center/Anesthesiology Phone [phone] 04/23/2023 9:34 AM

## 2023-04-23 NOTE — Anesthesia Preprocedure Evaluation (Signed)
 Anesthesia Evaluation  Patient identified by MRN, date of birth, ID band Patient awake    Reviewed: Allergy & Precautions, NPO status , Patient's Chart, lab work & pertinent test results, reviewed documented beta blocker date and time   History of Anesthesia Complications Negative for: history of anesthetic complications  Airway Mallampati: II  TM Distance: >3 FB Neck ROM: Full    Dental  (+) Edentulous Upper, Edentulous Lower   Pulmonary sleep apnea and Continuous Positive Airway Pressure Ventilation    breath sounds clear to auscultation       Cardiovascular hypertension, Pt. on medications and Pt. on home beta blockers (-) angina + CAD and + Cardiac Stents   Rhythm:Regular Rate:Normal  '23 ECHO: EF 60 to 65%.  1. The LV has normal function, no regional wall motion abnormalities. There is moderate LVH   2. RVFis normal. The right ventricular size is normal.   3. Left atrial size was mildly dilated.   4. No evidence of mitral valve regurgitation. Moderate mitral annular calcification.   5. Aortic valve regurgitation is not visualized. Aortic valve sclerosis/calcification is present, without any evidence of aortic stenosis.     Neuro/Psych CVA    GI/Hepatic Neg liver ROS,GERD  Controlled and Medicated,,  Endo/Other    Class 4 obesityBMI 46  Renal/GU negative Renal ROS     Musculoskeletal  (+) Arthritis ,    Abdominal   Peds  Hematology negative hematology ROS (+)   Anesthesia Other Findings   Reproductive/Obstetrics                             Anesthesia Physical Anesthesia Plan  ASA: 3  Anesthesia Plan: General   Post-op Pain Management: Tylenol PO (pre-op)*   Induction: Intravenous  PONV Risk Score and Plan: 3 and Ondansetron, Dexamethasone and Treatment may vary due to age or medical condition  Airway Management Planned: LMA  Additional Equipment: None  Intra-op Plan:    Post-operative Plan:   Informed Consent: I have reviewed the patients History and Physical, chart, labs and discussed the procedure including the risks, benefits and alternatives for the proposed anesthesia with the patient or authorized representative who has indicated his/her understanding and acceptance.       Plan Discussed with: CRNA and Surgeon  Anesthesia Plan Comments: (Pt requests identical anesthetic to what she received with the last D & C  PAT note by Antionette Poles, PA-C: 73 year old female follows with cardiology for history of HTN, HLD, CAD s/p PTCA/DES to OM1 and balloon angioplasty to the mid circumflex/OM 2 on 01/16/2022.  LVEF was 55 to 60% at the time of cath.  Cardiac clearance progress note 03/14/2023 by Edd Fabian, NP, "Chart reviewed as part of pre-operative protocol coverage. Given past medical history and time since last visit, based on ACC/AHA guidelines, Kerry [name] would be at acceptable risk for the planned procedure without further cardiovascular testing. Her RCRI is moderate risk, 6.6% risk of major cardiac event.  She is able to complete greater than 4 METS of physical activity. Patient was advised that if she develops new symptoms prior to surgery to contact our office to arrange a follow-up appointment.  He verbalized understanding."  Patient did subsequently undergo D&C/hysteroscopy with MyoSure on 03/21/2023 without complication.  Follows with pulmonology for history of OSA on CPAP, mild ILD with UIP pattern (PFTs normal).  She uses Symbicort as needed for cough/wheezing/chest congestion.  Other pertinent history includes GERD,  TMJ, TIA, obesity BMI 46.  Preop labs reviewed, WNL.  EKG 03/14/2023: Normal sinus rhythm. Rate 89. Minimal voltage criteria for LVH, may be normal variant. Septal infarct , age undetermined  Cath/PCI 01/26/2022:    Mid Cx lesion is 80% stenosed.   Prox Cx lesion is 20% stenosed.   2nd Diag lesion is 60%  stenosed.   3rd Mrg lesion is 90% stenosed.   A drug-eluting stent was successfully placed using a SYNERGY XD 2.25X20.   Balloon angioplasty was performed using a BALLN SAPPHIRE 2.0X12.   Post intervention, there is a 0% residual stenosis.   Post intervention, there is a 60% residual stenosis.  Severe stenosis in the mid Circumflex affecting flow into the first obtuse marginal branch and the second obtuse marginal branch.  Successful PTCA/DES x 1 OM1 (Synergy stent platform as felt to be the most favorable given her metal allergy profile) Successful balloon angioplasty mid Circumflex/OM2  Recommendations: DAPT with ASA and Plavix for at least six months.   TTE 04/08/2021: 1. Left ventricular ejection fraction, by estimation, is 60 to 65%. The  left ventricle has normal function. The left ventricle has no regional  wall motion abnormalities. There is moderate left ventricular hypertrophy.  Left ventricular diastolic  parameters are indeterminate.   2. Right ventricular systolic function is normal. The right ventricular  size is normal. Tricuspid regurgitation signal is inadequate for assessing  PA pressure.   3. Left atrial size was mildly dilated.   4. No evidence of mitral valve regurgitation. Moderate mitral annular  calcification.   5. Aortic valve regurgitation is not visualized. Aortic valve  sclerosis/calcification is present, without any evidence of aortic  stenosis.    )        Anesthesia Quick Evaluation

## 2023-04-24 ENCOUNTER — Encounter: Payer: Medicare Other | Admitting: Obstetrics and Gynecology

## 2023-04-24 ENCOUNTER — Telehealth: Payer: Self-pay | Admitting: *Deleted

## 2023-04-24 NOTE — Telephone Encounter (Signed)
Telephone call to check on pre-operative status.  Patient compliant with pre-operative instructions.  Reinforced nothing to eat after midnight. Clear liquids until 0545. Patient to arrive at 14.  No questions or concerns voiced.  Instructed to call for any needs.

## 2023-04-24 NOTE — Telephone Encounter (Signed)
 Attempted to reach patient for pre-op call. Left voicemail requesting call back.

## 2023-04-24 NOTE — Telephone Encounter (Signed)
2nd attempt to reach patient for pre-op call. Left voicemail requesting call back.

## 2023-04-25 ENCOUNTER — Other Ambulatory Visit: Payer: Self-pay

## 2023-04-25 ENCOUNTER — Ambulatory Visit (HOSPITAL_COMMUNITY): Payer: Self-pay | Admitting: Physician Assistant

## 2023-04-25 ENCOUNTER — Ambulatory Visit (HOSPITAL_COMMUNITY)
Admission: RE | Admit: 2023-04-25 | Discharge: 2023-04-25 | Disposition: A | Discharge status: Home / Self Care | Attending: Gynecologic Oncology | Admitting: Gynecologic Oncology

## 2023-04-25 ENCOUNTER — Ambulatory Visit (HOSPITAL_BASED_OUTPATIENT_CLINIC_OR_DEPARTMENT_OTHER): Payer: Self-pay | Admitting: Anesthesiology

## 2023-04-25 ENCOUNTER — Encounter (HOSPITAL_COMMUNITY)
Admission: RE | Disposition: A | Discharge status: Home / Self Care | Payer: Self-pay | Source: Home / Self Care | Attending: Gynecologic Oncology

## 2023-04-25 ENCOUNTER — Encounter (HOSPITAL_COMMUNITY): Payer: Self-pay | Admitting: Gynecologic Oncology

## 2023-04-25 DIAGNOSIS — Z8673 Personal history of transient ischemic attack (TIA), and cerebral infarction without residual deficits: Secondary | ICD-10-CM | POA: Diagnosis not present

## 2023-04-25 DIAGNOSIS — N8502 Endometrial intraepithelial neoplasia [EIN]: Secondary | ICD-10-CM | POA: Diagnosis not present

## 2023-04-25 DIAGNOSIS — Z955 Presence of coronary angioplasty implant and graft: Secondary | ICD-10-CM | POA: Insufficient documentation

## 2023-04-25 DIAGNOSIS — E6689 Other obesity not elsewhere classified: Secondary | ICD-10-CM | POA: Insufficient documentation

## 2023-04-25 DIAGNOSIS — K219 Gastro-esophageal reflux disease without esophagitis: Secondary | ICD-10-CM | POA: Diagnosis not present

## 2023-04-25 DIAGNOSIS — G4733 Obstructive sleep apnea (adult) (pediatric): Secondary | ICD-10-CM | POA: Insufficient documentation

## 2023-04-25 DIAGNOSIS — N840 Polyp of corpus uteri: Secondary | ICD-10-CM | POA: Diagnosis not present

## 2023-04-25 DIAGNOSIS — Z6841 Body Mass Index (BMI) 40.0 and over, adult: Secondary | ICD-10-CM | POA: Insufficient documentation

## 2023-04-25 DIAGNOSIS — I2510 Atherosclerotic heart disease of native coronary artery without angina pectoris: Secondary | ICD-10-CM

## 2023-04-25 DIAGNOSIS — Z30430 Encounter for insertion of intrauterine contraceptive device: Secondary | ICD-10-CM | POA: Diagnosis not present

## 2023-04-25 DIAGNOSIS — I10 Essential (primary) hypertension: Secondary | ICD-10-CM

## 2023-04-25 DIAGNOSIS — M1990 Unspecified osteoarthritis, unspecified site: Secondary | ICD-10-CM | POA: Insufficient documentation

## 2023-04-25 HISTORY — PX: INTRAUTERINE DEVICE (IUD) INSERTION: SHX5877

## 2023-04-25 HISTORY — PX: HYSTEROSCOPY WITH D & C: SHX1775

## 2023-04-25 LAB — TYPE AND SCREEN
ABO/RH(D): A POS
Antibody Screen: NEGATIVE

## 2023-04-25 LAB — ABO/RH: ABO/RH(D): A POS

## 2023-04-25 SURGERY — DILATATION AND CURETTAGE /HYSTEROSCOPY
Anesthesia: General

## 2023-04-25 MED ORDER — LIDOCAINE HCL (PF) 1 % IJ SOLN
INTRAMUSCULAR | Status: AC
  Filled 2023-04-25: qty 30

## 2023-04-25 MED ORDER — LIDOCAINE HCL (PF) 2 % IJ SOLN
INTRAMUSCULAR | Status: DC | PRN
Start: 2023-04-25 — End: 2023-04-25
  Administered 2023-04-25: 50 mg via INTRADERMAL

## 2023-04-25 MED ORDER — LIDOCAINE HCL (PF) 2 % IJ SOLN
INTRAMUSCULAR | Status: AC
Start: 2023-04-25 — End: ?
  Filled 2023-04-25: qty 5

## 2023-04-25 MED ORDER — DEXAMETHASONE SODIUM PHOSPHATE 10 MG/ML IJ SOLN
INTRAMUSCULAR | Status: AC
  Filled 2023-04-25: qty 1

## 2023-04-25 MED ORDER — CHLORHEXIDINE GLUCONATE 0.12 % MT SOLN
15.0000 mL | Freq: Once | OROMUCOSAL | Status: AC
  Administered 2023-04-25: 15 mL via OROMUCOSAL

## 2023-04-25 MED ORDER — EPHEDRINE 5 MG/ML INJ
INTRAVENOUS | Status: AC
  Filled 2023-04-25: qty 5

## 2023-04-25 MED ORDER — DEXAMETHASONE SODIUM PHOSPHATE 10 MG/ML IJ SOLN
INTRAMUSCULAR | Status: DC | PRN
Start: 2023-04-25 — End: 2023-04-25
  Administered 2023-04-25: 5 mg via INTRAVENOUS

## 2023-04-25 MED ORDER — ACETAMINOPHEN 500 MG PO TABS
1000.0000 mg | ORAL_TABLET | ORAL | Status: AC
  Administered 2023-04-25: 1000 mg via ORAL
  Filled 2023-04-25: qty 2

## 2023-04-25 MED ORDER — FENTANYL CITRATE (PF) 100 MCG/2ML IJ SOLN
INTRAMUSCULAR | Status: DC | PRN
  Administered 2023-04-25 (×2): 50 ug via INTRAVENOUS

## 2023-04-25 MED ORDER — LACTATED RINGERS IV SOLN: INTRAVENOUS | Status: DC | PRN

## 2023-04-25 MED ORDER — MIDAZOLAM HCL 2 MG/2ML IJ SOLN: 0.5000 mg | Freq: Once | INTRAMUSCULAR | Status: DC | PRN

## 2023-04-25 MED ORDER — EPHEDRINE SULFATE-NACL 50-0.9 MG/10ML-% IV SOSY
PREFILLED_SYRINGE | INTRAVENOUS | Status: DC | PRN
  Administered 2023-04-25: 10 mg via INTRAVENOUS

## 2023-04-25 MED ORDER — OXYCODONE HCL 5 MG PO TABS: 5.0000 mg | ORAL_TABLET | Freq: Once | ORAL | Status: DC | PRN

## 2023-04-25 MED ORDER — FENTANYL CITRATE (PF) 100 MCG/2ML IJ SOLN
INTRAMUSCULAR | Status: AC
  Filled 2023-04-25: qty 2

## 2023-04-25 MED ORDER — OXYCODONE HCL 5 MG/5ML PO SOLN: 5.0000 mg | Freq: Once | ORAL | Status: DC | PRN

## 2023-04-25 MED ORDER — ORAL CARE MOUTH RINSE: 15.0000 mL | Freq: Once | OROMUCOSAL | Status: AC

## 2023-04-25 MED ORDER — ACETAMINOPHEN 500 MG PO TABS
1000.0000 mg | ORAL_TABLET | Freq: Once | ORAL | Status: DC
Start: 2023-04-25 — End: 2023-04-25

## 2023-04-25 MED ORDER — SODIUM CHLORIDE 0.9 % IR SOLN
Status: DC | PRN
  Administered 2023-04-25: 3000 mL

## 2023-04-25 MED ORDER — DEXAMETHASONE SODIUM PHOSPHATE 4 MG/ML IJ SOLN: 4.0000 mg | INTRAMUSCULAR | Status: DC

## 2023-04-25 MED ORDER — LACTATED RINGERS IV SOLN: INTRAVENOUS | Status: DC

## 2023-04-25 MED ORDER — ONDANSETRON HCL 4 MG/2ML IJ SOLN
INTRAMUSCULAR | Status: DC | PRN
Start: 2023-04-25 — End: 2023-04-25
  Administered 2023-04-25: 4 mg via INTRAVENOUS

## 2023-04-25 MED ORDER — ONDANSETRON HCL 4 MG/2ML IJ SOLN
INTRAMUSCULAR | Status: AC
  Filled 2023-04-25: qty 2

## 2023-04-25 MED ORDER — LIDOCAINE HCL 1 % IJ SOLN
INTRAMUSCULAR | Status: DC | PRN
Start: 2023-04-25 — End: 2023-04-25
  Administered 2023-04-25: 10 mL

## 2023-04-25 MED ORDER — MEPERIDINE HCL 50 MG/ML IJ SOLN: 6.2500 mg | INTRAMUSCULAR | Status: DC | PRN

## 2023-04-25 MED ORDER — PROPOFOL 500 MG/50ML IV EMUL
INTRAVENOUS | Status: AC
  Filled 2023-04-25: qty 50

## 2023-04-25 MED ORDER — FENTANYL CITRATE PF 50 MCG/ML IJ SOSY: 25.0000 ug | PREFILLED_SYRINGE | INTRAMUSCULAR | Status: DC | PRN

## 2023-04-25 MED ORDER — LEVONORGESTREL 20 MCG/DAY IU IUD
1.0000 | INTRAUTERINE_SYSTEM | INTRAUTERINE | Status: AC
  Administered 2023-04-25: 1 via INTRAUTERINE
  Filled 2023-04-25: qty 1

## 2023-04-25 MED ORDER — PROPOFOL 500 MG/50ML IV EMUL
INTRAVENOUS | Status: DC | PRN
  Administered 2023-04-25: 75 ug/kg/min via INTRAVENOUS

## 2023-04-25 SURGICAL SUPPLY — 19 items
BAG COUNTER SPONGE SURGICOUNT (BAG) ×1 IMPLANT
DEVICE MYOSURE LITE (MISCELLANEOUS) IMPLANT
DEVICE MYOSURE REACH (MISCELLANEOUS) IMPLANT
DILATOR CANAL MILEX (MISCELLANEOUS) IMPLANT
GAUZE 4X4 16PLY ~~LOC~~+RFID DBL (SPONGE) IMPLANT
GLOVE BIO SURGEON STRL SZ 6 (GLOVE) ×1 IMPLANT
GLOVE BIO SURGEON STRL SZ 6.5 (GLOVE) IMPLANT
GOWN STRL REUS W/ TWL LRG LVL3 (GOWN DISPOSABLE) ×1 IMPLANT
IV NS IRRIG 3000ML ARTHROMATIC (IV SOLUTION) ×1 IMPLANT
KIT PROCEDURE FLUENT (KITS) IMPLANT
KIT TURNOVER KIT A (KITS) IMPLANT
LOOP CUTTING BIPOLAR 21FR (ELECTRODE) IMPLANT
MYOSURE XL FIBROID (MISCELLANEOUS) IMPLANT
PACK VAGINAL WOMENS (CUSTOM PROCEDURE TRAY) ×1 IMPLANT
PAD OB MATERNITY 11 LF (PERSONAL CARE ITEMS) IMPLANT
SEAL ROD LENS SCOPE MYOSURE (ABLATOR) IMPLANT
SYSTEM TISS REMOVAL MYOSURE XL (MISCELLANEOUS) IMPLANT
TOWEL OR 17X26 10 PK STRL BLUE (TOWEL DISPOSABLE) ×1 IMPLANT
WATER STERILE IRR 500ML POUR (IV SOLUTION) ×1 IMPLANT

## 2023-04-25 NOTE — Discharge Instructions (Addendum)
 AFTER SURGERY INSTRUCTIONS   Return to work:  1-2 days if applicable   Activity: 1. Be up and out of the bed during the day.  Take a nap if needed.  You may walk up steps but be careful and use the hand rail.  Stair climbing will tire you more than you think, you may need to stop part way and rest.    2. No lifting or straining for 2 weeks over 10 pounds. No pushing, pulling, straining for 2 weeks.   3. No driving for minimum 24 hours after surgery.  Do not drive if you are taking narcotic pain medicine and make sure that your reaction time has returned.    4. You can shower as soon as the next day after surgery. Shower daily. No tub baths or submerging your body in water until cleared by your surgeon (2 weeks). If you have the soap that was given to you by pre-surgical testing that was used before surgery, you do not need to use it afterwards because this can irritate your incisions.    5. No sexual activity and nothing in the vagina for 2 weeks.   6. You may experience vaginal spotting and discharge after surgery.  The spotting is normal but if you experience heavy bleeding, call our office.   7. Take Tylenol or ibuprofen for pain if you are able to take these medications.  Monitor your Tylenol intake to a max of 4,000 mg in a 24 hour period.    Diet: 1. Low sodium Heart Healthy Diet is recommended but you are cleared to resume your normal (before surgery) diet after your procedure.   2. It is safe to use a laxative, such as Miralax or Colace, if you have difficulty moving your bowels.    Wound Care: 1. Keep clean and dry.  Shower daily.   Reasons to call the Doctor: Fever - Oral temperature greater than 100.4 degrees Fahrenheit Foul-smelling vaginal discharge Difficulty urinating Nausea and vomiting Increased pain at the site of the incision that is unrelieved with pain medicine. Difficulty breathing with or without chest pain New calf pain especially if only on one side Sudden,  continuing increased vaginal bleeding with or without clots.   Contacts: For questions or concerns you should contact:   Dr. Eugene Garnet at [phone]   Warner Mccreedy, NP at [phone]   After Hours: call [phone] and have the GYN Oncologist paged/contacted (after 5 pm or on the weekends).   Messages sent via mychart are for non-urgent matters and are not responded to after hours so for urgent needs, please call the after hours number.

## 2023-04-25 NOTE — Transfer of Care (Signed)
 Immediate Anesthesia Transfer of Care Note  Patient: Kerry [name]  Procedure(s) Performed: DILATATION AND CURETTAGE /HYSTEROSCOPY INSERTION, INTRAUTERINE DEVICE  Patient Location: PACU  Anesthesia Type:General  Level of Consciousness: awake, alert , and oriented  Airway & Oxygen Therapy: Patient Spontanous Breathing and Patient connected to face mask oxygen  Post-op Assessment: Report given to RN and Post -op Vital signs reviewed and stable  Post vital signs: Reviewed and stable  Last Vitals:  Vitals Value Taken Time  BP 148/71 04/25/23 0946  Temp    Pulse 82 04/25/23 0949  Resp 20 04/25/23 0949  SpO2 98 % 04/25/23 0949  Vitals shown include unfiled device data.  Last Pain:  Vitals:   04/25/23 0734  TempSrc: Oral  PainSc: 4          Complications: No notable events documented.

## 2023-04-25 NOTE — Op Note (Signed)
 OPERATIVE NOTE   PATIENT: Kerry [name] DATE: 04/25/23   Preop Diagnosis: [name]   Postoperative Diagnosis: same as above   Surgery: Hysteroscopy, endometrial sampling, Mirena IUD insertion   Surgeons:  Eugene Garnet, MD   Assistant: none   Anesthesia: General    Estimated blood loss: 15 ml   IVF:  see I&O flowsheet    Net fluid balance: -50 cc   Urine output: none    Complications: None apparent   Pathology: endometrial curetteings   Operative findings: Small mobile uterus, normal cervix. On hysteroscopy, several 1-3 mm polypoid appearing lesions within the endometrial cavity. Endometrium otherwise atrophic and normal in appearance. Mirena Lot [id number], exp 02/2025.         Procedure: The patient was identified in the preoperative holding area. Informed consent was signed on the chart. Patient was seen history was reviewed and exam was performed.    The patient was then taken to the operating room and placed in the supine position with SCD hose on. General anesthesia was then induced without difficulty. She was then placed in the dorsolithotomy position. The perineum was prepped with Betadine. The vagina was prepped with Betadine. The patient was then draped after the prep was dried.    Timeout was performed the patient, procedure, antibiotic, allergy, and length of procedure.    The speculum was placed in the vagina. The single tooth tenaculum was placed on the anterior lip of the cervix. 10 cc of 0.25% marcaine was injected for local anesthesia. The uterine sound was placed in the cervix and advanced to the fundus. The cervix was successively dilated using pratts dilators to 68F. The myosure hysteroscope was inserted into the uterine cavity and pictures taken. Bilateral tubal ostia visualized. The myosure reach was then used to resect all endometrial cavity lesions and to sample some of the endometrium. This was collected in the specimen sock to be sent to  pathology.    The hysteroscope was removed and the Mirena IUD was inserted to the uterine fundus (8 cm), arms deployed, and the inserter removed. The strings were cut at 3-4 cm.    The tenaculum was removed and hemostasis was observed.    The vagina was irrigated.   All instrument, suture, laparotomy, Ray-Tec, and needle counts were correct x2. The patient tolerated the procedure well and was taken recovery room in stable condition.    Carver Fila, MD

## 2023-04-25 NOTE — Anesthesia Procedure Notes (Signed)
 Procedure Name: LMA Insertion Date/Time: 04/25/2023 9:11 AM  Performed by: Cleda Clarks, CRNAPre-anesthesia Checklist: Patient identified, Emergency Drugs available, Suction available and Patient being monitored Patient Re-evaluated:Patient Re-evaluated prior to induction Oxygen Delivery Method: Circle system utilized Preoxygenation: Pre-oxygenation with 100% oxygen Induction Type: IV induction Ventilation: Mask ventilation without difficulty LMA: LMA inserted LMA Size: 4.0 Number of attempts: 1 Placement Confirmation: positive ETCO2 Tube secured with: Tape Dental Injury: Teeth and Oropharynx as per pre-operative assessment

## 2023-04-25 NOTE — Anesthesia Postprocedure Evaluation (Signed)
 Anesthesia Post Note  Patient: Kerry [name]  Procedure(s) Performed: DILATATION AND CURETTAGE /HYSTEROSCOPY INSERTION, INTRAUTERINE DEVICE     Patient location during evaluation: PACU Anesthesia Type: General Level of consciousness: awake and alert, patient cooperative and oriented Pain management: pain level controlled Vital Signs Assessment: post-procedure vital signs reviewed and stable Respiratory status: spontaneous breathing, nonlabored ventilation and respiratory function stable Cardiovascular status: blood pressure returned to baseline and stable Postop Assessment: no apparent nausea or vomiting and able to ambulate Anesthetic complications: no   No notable events documented.  Last Vitals:  Vitals:   04/25/23 1032 04/25/23 1045  BP: (!) 166/78 (!) 158/76  Pulse: 78 78  Resp: 19   Temp: (!) 36.4 C   SpO2: 94%     Last Pain:  Vitals:   04/25/23 1032  TempSrc:   PainSc: 0-No pain                 Jesper Stirewalt,E. Makeya Hilgert

## 2023-04-25 NOTE — Interval H&P Note (Signed)
 History and Physical Interval Note:  04/25/2023 7:23 AM  Kerry [name]  has presented today for surgery, with the diagnosis of Endometrial intraepithelial neoplasia.  The various methods of treatment have been discussed with the patient and family. After consideration of risks, benefits and other options for treatment, the patient has consented to  Procedure(s) with comments: DILATATION AND CURETTAGE /HYSTEROSCOPY (N/A) - MYOSURE INSERTION, INTRAUTERINE DEVICE (N/A) as a surgical intervention.  The patient's history has been reviewed, patient examined, no change in status, stable for surgery.  I have reviewed the patient's chart and labs.  Questions were answered to the patient's satisfaction.     Carver Fila

## 2023-04-26 ENCOUNTER — Encounter (HOSPITAL_COMMUNITY): Payer: Self-pay | Admitting: Gynecologic Oncology

## 2023-04-26 ENCOUNTER — Encounter: Payer: Self-pay | Admitting: Gynecologic Oncology

## 2023-04-26 ENCOUNTER — Telehealth: Payer: Self-pay | Admitting: *Deleted

## 2023-04-26 LAB — SURGICAL PATHOLOGY

## 2023-04-26 NOTE — Telephone Encounter (Signed)
 Spoke with Kerry [name] this morning. She states she is eating, drinking and urinating well. She has not had a BM yet but is passing gas. She is taking senokot as prescribed and encouraged her to drink plenty of water. She denies fever or chills. She rates her pain 0/10. She is not taking anything for pain.    Instructed to call office with any fever, chills, purulent drainage, uncontrolled pain or any other questions or concerns. Patient verbalizes understanding.   Pt aware of post op appointments as well as the office number [phone] and after hours number [phone] to call if she has any questions or concerns

## 2023-04-30 ENCOUNTER — Encounter: Payer: Self-pay | Admitting: Obstetrics and Gynecology

## 2023-05-02 ENCOUNTER — Encounter: Payer: Self-pay | Admitting: Gynecologic Oncology

## 2023-05-02 ENCOUNTER — Inpatient Hospital Stay: Attending: Genetic Counselor | Admitting: Gynecologic Oncology

## 2023-05-02 ENCOUNTER — Telehealth: Payer: Self-pay | Admitting: *Deleted

## 2023-05-02 DIAGNOSIS — N8502 Endometrial intraepithelial neoplasia [EIN]: Secondary | ICD-10-CM | POA: Diagnosis not present

## 2023-05-02 NOTE — Progress Notes (Signed)
 Gynecologic Oncology Telehealth Note: Gyn-Onc  I connected with Kerry [name] on 05/02/23 at  6:00 PM EDT by telephone and verified that I am speaking with the correct person using two identifiers.  I discussed the limitations, risks, security and privacy concerns of performing an evaluation and management service by telemedicine and the availability of in-person appointments. I also discussed with the patient that there may be a patient responsible charge related to this service. The patient expressed understanding and agreed to proceed.  Other persons participating in the visit and their role in the encounter: none.  Patient's location: home Provider's location: [facility]  Reason for Visit: follow-up  Treatment History: Presented with PMB in 2023.  Pelvic ultrasound in 06/2021 showed multiple uterine and likely cervical leiomyoma, largest 3.6 cm.  08/2021: D&C hysteroscopy with benign polyp and submucosal fibroid sampling. No hyperplasia or malignancy. 11/22/22: EMB with scant atrophic endometrium. 02/06/23: SIS with filling defect:  31 x 10 mm, and thickened endometrium. EMB with benign endometrium.  03/21/23: Hysteroscopy, endometrial sampling. Findings of endometrial polyp.  Pathology - scanty inactive endometrium, benign endocervix. Endometrial polypectomy with [name] involving fragments of polyp, microscopic foci borderline on well-differentiated endometrioid adenocarcinoma.   04/25/23: Hysteroscopy, endometrial sampling, Mirena IUD insertion   Interval History: Doing well. Vaginal bleeding stopped 2-3 days ago. Denies cramping.  Past Medical/Surgical History: Past Medical History:  Diagnosis Date   Abnormal uterine bleeding    Allergic contact dermatitis due to metals    Allergic rhinitis, cause unspecified    Bursitis of left shoulder    Chronic pain syndrome    Complication of anesthesia    03-20-2023  pt stated has issues with memory post op   Coronary artery disease  01/2022   cardiologist--- dr Lovina Reach;   positive PET NUC 01-24-2022;  cath 01-25-2022/  01-26-2022  multivessel CAD w/ severe global LV contractibility ef 55-60% LVEDP 30mm;    s/p  PTCA balloon mCX and OM2 and DES x1 to OM1   Dyspareunia    Endometrial mass    Family history of ovarian cancer    Full dentures    GERD (gastroesophageal reflux disease)    improved   History of adenomatous polyp of colon    History of cerebral infarction    neurologist--- dr Kirtland Bouchard. patel (lov note in epic 12-21-2021 for vertigo & HA)  MRI imaging incidental finding remote small vessal infarct right basal ganglia   History of esophageal stricture    s/p  EGD w/ dilatation 2010 and 03-12-2018   History of kidney stones    HTN (hypertension)    Hyperlipidemia    Interstitial lung disease (HCC)    followed by dr Craige Cotta ([facility] [place])   long COVID related mild ILD with UIP pattern   Multiple pulmonary nodules    Neuromuscular disorder (HCC)    OA (osteoarthritis)    knees;  hands   OAB (overactive bladder)    OSA on CPAP    followed by pulmonology--- dr Craige Cotta;   HST in epic 03-04-2015  moderate osa   Polyarthralgia    Pre-diabetes    S/P drug eluting coronary stent placement 01/26/2022   PTCA balloon angioplasty and DES x1 toOM1   Somatic dysfunction    multiple sites;    lumbar & sacral regions;  ribs;   cervical spine   Stroke (HCC) 11/2022   dx TIA   Urinary incontinence    Uterine fibroid     Past Surgical History:  Procedure Laterality Date   CARPAL TUNNEL RELEASE Bilateral    left 05-21-2020;   right  1998   CATARACT EXTRACTION W/ INTRAOCULAR LENS IMPLANT Bilateral 2018   COLONOSCOPY WITH ESOPHAGOGASTRODUODENOSCOPY (EGD)  [facility]23   [facility] [place]   COLONOSCOPY WITH ESOPHAGOGASTRODUODENOSCOPY (EGD) AND ESOPHAGEAL DILATION (ED)  03/12/2018   @ [facility]   CORONARY STENT INTERVENTION N/A 01/26/2022   Procedure: CORONARY STENT INTERVENTION;  Surgeon: Kathleene Hazel, MD;  Location: [facility];  Service: Cardiovascular;  Laterality: N/A;   DILATATION & CURETTAGE/HYSTEROSCOPY WITH MYOSURE N/A 08/22/2021   Procedure: DILATATION & CURETTAGE/HYSTEROSCOPY WITH MYOSURE RESECTION OF ENDOMETRIAL MASS;  Surgeon: Patton Salles, MD;  Location: [facility];  Service: Gynecology;  Laterality: N/A;   DILATATION & CURETTAGE/HYSTEROSCOPY WITH MYOSURE N/A 03/21/2023   Procedure: DILATATION & CURETTAGE/HYSTEROSCOPY WITH MYOSURE;  Surgeon: Rosalyn Gess, MD;  Location: [facility];  Service: Gynecology;  Laterality: N/A;  [facility]   DILATION AND CURETTAGE OF UTERUS  2000   HAMMER TOE SURGERY Right 1990   HYSTEROSCOPY WITH D & C N/A 04/25/2023   Procedure: DILATATION AND CURETTAGE /HYSTEROSCOPY;  Surgeon: Carver Fila, MD;  Location: [facility];  Service: Gynecology;  Laterality: N/A;  MYOSURE   INTRAUTERINE DEVICE (IUD) INSERTION N/A 04/25/2023   Procedure: INSERTION, INTRAUTERINE DEVICE;  Surgeon: Carver Fila, MD;  Location: [facility];  Service: Gynecology;  Laterality: N/A;   LEFT HEART CATH AND CORONARY ANGIOGRAPHY N/A 01/25/2022   Procedure: LEFT HEART CATH AND CORONARY ANGIOGRAPHY;  Surgeon: Lennette Bihari, MD;  Location: [facility];  Service: Cardiovascular;  Laterality: N/A;   NASAL SEPTUM SURGERY  2000   OPERATIVE ULTRASOUND N/A 08/22/2021   Procedure: OPERATIVE ULTRASOUND GUIDED HYSTEROSCOPY;  Surgeon: Patton Salles, MD;  Location: [facility];  Service: Gynecology;  Laterality: N/A;   PELVIC LAPAROSCOPY  1990   remembers pelvic pain, normal findings   ROTATOR CUFF REPAIR Left 11/11/2013   @ [facility]  by  Dr. Ave Filter   SHOULDER ARTHROSCOPY W/ ROTATOR CUFF REPAIR Right 10/09/2011   @[facility]  by Dr. Ave Filter   UMBILICAL HERNIA REPAIR  2010   Patient reports mesh was used for the repair.    Family History  Problem Relation Age of Onset   Rheumatologic disease Mother    Diabetes Mother     Heart attack Father    Basal cell carcinoma Father    Rheumatologic disease Sister    Arthritis Sister        --Rheumatoid arthritis   Ovarian cancer Sister 82       hyst in 21s, surgery in 29s, no chemo, still alive   Crohn's disease Brother    Kidney failure Brother    Ulcerative colitis Brother    Glaucoma Brother    Cancer Niece 62       gyn   Breast cancer Neg Hx    Prostate cancer Neg Hx    Colon cancer Neg Hx    Pancreatic cancer Neg Hx    Endometrial cancer Neg Hx     Social History   Socioeconomic History   Marital status: Married    Spouse name: Not on file   Number of children: Not on file   Years of education: Not on file   Highest education level: Not on file  Occupational History   Occupation: Retired  Tobacco Use   Smoking status: Never   Smokeless tobacco: Never  Vaping Use   Vaping status: Never Used  Substance and Sexual Activity   Alcohol use: No    Alcohol/week: 0.0 standard drinks of alcohol   Drug use: Never   Sexual activity: Not Currently    Partners: Male    Birth control/protection: Post-menopausal  Other Topics Concern   Not on file  Social History Narrative   Are you right handed or left handed? Right handed   Are you currently employed ? No retired   What is your current occupation?   Do you live at home alone? No   Who lives with you? Lives with husband.    What type of home do you live in: 1 story or 2 story? Two story home.       Social Drivers of Corporate investment banker Strain: Not on file  Food Insecurity: Low Risk  (02/07/2023)   Received from [facility]   Hunger Vital Sign    Worried About Running Out of Food in the Last Year: Never true    Ran Out of Food in the Last Year: Never true  Transportation Needs: No Transportation Needs (02/07/2023)   Received from [facility]    In the past 12 months, has lack of reliable transportation kept you from medical appointments, meetings, work or from  getting things needed for daily living? : No  Physical Activity: Not on file  Stress: Not on file  Social Connections: Not on file    Current Medications:  Current Outpatient Medications:    amLODipine (NORVASC) 5 MG tablet, Take 1 tablet (5 mg total) by mouth daily. (Patient taking differently: Take 5 mg by mouth daily after lunch.), Disp: 90 tablet, Rfl: 3   ascorbic acid (VITAMIN C) 500 MG tablet, Take 250 mg by mouth daily., Disp: , Rfl:    aspirin EC 81 MG tablet, Take 1 tablet (81 mg total) by mouth daily. Swallow whole. (Patient taking differently: Take 81 mg by mouth daily after lunch. Swallow whole.), Disp: 100 tablet, Rfl: 3   carboxymethylcellulose (REFRESH PLUS) 0.5 % SOLN, Place 1 drop into both eyes 2 (two) times daily as needed (for dry eyes)., Disp: , Rfl:    Coenzyme Q10 200 MG capsule, Take 200 mg by mouth daily after lunch., Disp: , Rfl:    cyanocobalamin (VITAMIN B12) 1000 MCG tablet, Take 1,000 mcg by mouth every Monday, Wednesday, and Friday., Disp: , Rfl:    cyanocobalamin (VITAMIN B12) 1000 MCG/ML injection, Inject 1,000 mcg into the muscle every 30 (thirty) days., Disp: , Rfl:    Evolocumab (REPATHA SURECLICK) 140 MG/ML SOAJ, Inject 140 mg into the skin every 14 (fourteen) days., Disp: 2 mL, Rfl: 11   FIBER ADULT GUMMIES PO, Take 2-3 each by mouth 2 (two) times daily as needed (constipation)., Disp: , Rfl:    furosemide (LASIX) 40 MG tablet, Take 1 tablet (40 mg total) by mouth every other day., Disp: 90 tablet, Rfl: 3   metoprolol succinate (TOPROL-XL) 25 MG 24 hr tablet, Take 25 mg by mouth daily after lunch., Disp: , Rfl:    Multiple Vitamins-Minerals (SUPER MULTI-VITAMIN PO), Take 1 capsule by mouth daily. Solgar Formula VM-75, Disp: , Rfl:    nitroGLYCERIN (NITROSTAT) 0.4 MG SL tablet, Place 1 tablet (0.4 mg total) under the tongue every 5 (five) minutes x 3 doses as needed for chest pain., Disp: 25 tablet, Rfl: 1   Omega-3 Fatty Acids (FISH OIL) 500 MG CAPS, Take  500 mg by  mouth daily., Disp: , Rfl:    potassium chloride SA (KLOR-CON M) 20 MEQ tablet, Take 1 tablet (20 mEq total) by mouth every other day., Disp: 90 tablet, Rfl: 3   Probiotic Product (PROBIOTIC ADVANCED PO), Take 1 capsule by mouth daily.  Suprema Dophilus probiotic, Disp: , Rfl:    Sodium Chloride-Xylitol (XLEAR SINUS CARE SPRAY) SOLN, Place 2 sprays into the nose at bedtime. Rinse only, Disp: , Rfl:    TART CHERRY PO, Take 1 tablet by mouth daily. Chewable, Disp: , Rfl:    triamcinolone cream (KENALOG) 0.1 %, Apply 1 Application topically 2 (two) times daily as needed (eczema)., Disp: , Rfl:    TURMERIC-GINGER PO, Take 1 capsule by mouth 2 (two) times daily., Disp: , Rfl:    VITAMIN D-VITAMIN K PO, Take 1 capsule by mouth every other day. Vit d 5000 units and 100mcg of K, Disp: , Rfl:   Review of Symptoms: Pertinent positives as per HPI.  Physical Exam: Deferred given limitations of phone visit.  Laboratory & Radiologic Studies: A. ENDOMETRIAL, CURRETTINGS:  - Fragments of endometrial polyp with endometrial intraepithelial  neoplasia ([name]), see comment   Assessment & Plan: Kerry [name] is a 73 y.o. woman with [name] now s/p D&C with Mirena IUD placement.  Patient doing well. Discussed pathology which confirm [name], no invasive cancer noted. Reviewed continued expectations. I will send a message to my clinic staff to get the patient scheduled for follow-up in 3 months.   I discussed the assessment and treatment plan with the patient. The patient was provided with an opportunity to ask questions and all were answered. The patient agreed with the plan and demonstrated an understanding of the instructions.   The patient was advised to call back or see an in-person evaluation if the symptoms worsen or if the condition fails to improve as anticipated.   6 minutes of total time was spent for this patient encounter, including preparation, phone counseling with the patient and  coordination of care, and documentation of the encounter.   Wiley Hanger, MD  Division of Gynecologic Oncology  Department of Obstetrics and Gynecology  [facility]

## 2023-05-02 NOTE — Telephone Encounter (Signed)
 Per Dr Orvil Bland scheduled appt on 7/18 at 4 pm. Patient aware

## 2023-05-03 ENCOUNTER — Encounter: Payer: Medicare Other | Admitting: Genetic Counselor

## 2023-05-03 ENCOUNTER — Other Ambulatory Visit: Payer: Medicare Other

## 2023-05-08 DIAGNOSIS — M6281 Muscle weakness (generalized): Secondary | ICD-10-CM | POA: Diagnosis not present

## 2023-05-08 DIAGNOSIS — L609 Nail disorder, unspecified: Secondary | ICD-10-CM | POA: Diagnosis not present

## 2023-05-08 DIAGNOSIS — N8502 Endometrial intraepithelial neoplasia [EIN]: Secondary | ICD-10-CM | POA: Diagnosis not present

## 2023-05-08 DIAGNOSIS — R2681 Unsteadiness on feet: Secondary | ICD-10-CM | POA: Diagnosis not present

## 2023-05-09 ENCOUNTER — Ambulatory Visit (INDEPENDENT_AMBULATORY_CARE_PROVIDER_SITE_OTHER): Admitting: Obstetrics and Gynecology

## 2023-05-09 VITALS — BP 163/73 | HR 90

## 2023-05-09 DIAGNOSIS — N3281 Overactive bladder: Secondary | ICD-10-CM | POA: Diagnosis not present

## 2023-05-09 DIAGNOSIS — R350 Frequency of micturition: Secondary | ICD-10-CM

## 2023-05-09 NOTE — Progress Notes (Signed)
  Urogynecology  PTNS VISIT  CC:  Overactive bladder  73 y.o. with refractory overactive bladder who presents for percutaneous tibial nerve stimulation. The patient presents for PTNS Maintenance session # 3.   Procedure: The patient was placed in the sitting position and the right lower extremity was prepped in the usual fashion. The PTNS needle was then inserted at a 60 degree angle, 5 cm cephalad and 2 cm posterior to the medial malleolus. The PTNS unit was then programmed and an optimal response was noted at 6 milliamps. The PTNS stimulation was then performed at this setting for 30 minutes without incident and the patient tolerated the procedure well. The needle was removed and hemostasis was noted.   The pt will return in 6 weeks for PTNS Maintenance session # 4. All questions were answered.

## 2023-05-30 DIAGNOSIS — M1991 Primary osteoarthritis, unspecified site: Secondary | ICD-10-CM | POA: Diagnosis not present

## 2023-05-30 DIAGNOSIS — M6281 Muscle weakness (generalized): Secondary | ICD-10-CM | POA: Diagnosis not present

## 2023-05-30 DIAGNOSIS — R2689 Other abnormalities of gait and mobility: Secondary | ICD-10-CM | POA: Diagnosis not present

## 2023-06-05 DIAGNOSIS — R2689 Other abnormalities of gait and mobility: Secondary | ICD-10-CM | POA: Diagnosis not present

## 2023-06-05 DIAGNOSIS — M6281 Muscle weakness (generalized): Secondary | ICD-10-CM | POA: Diagnosis not present

## 2023-06-05 DIAGNOSIS — M1991 Primary osteoarthritis, unspecified site: Secondary | ICD-10-CM | POA: Diagnosis not present

## 2023-06-07 DIAGNOSIS — R2689 Other abnormalities of gait and mobility: Secondary | ICD-10-CM | POA: Diagnosis not present

## 2023-06-07 DIAGNOSIS — M1991 Primary osteoarthritis, unspecified site: Secondary | ICD-10-CM | POA: Diagnosis not present

## 2023-06-07 DIAGNOSIS — M6281 Muscle weakness (generalized): Secondary | ICD-10-CM | POA: Diagnosis not present

## 2023-06-12 DIAGNOSIS — M6281 Muscle weakness (generalized): Secondary | ICD-10-CM | POA: Diagnosis not present

## 2023-06-12 DIAGNOSIS — R2689 Other abnormalities of gait and mobility: Secondary | ICD-10-CM | POA: Diagnosis not present

## 2023-06-12 DIAGNOSIS — M1991 Primary osteoarthritis, unspecified site: Secondary | ICD-10-CM | POA: Diagnosis not present

## 2023-06-14 DIAGNOSIS — R2689 Other abnormalities of gait and mobility: Secondary | ICD-10-CM | POA: Diagnosis not present

## 2023-06-14 DIAGNOSIS — M1991 Primary osteoarthritis, unspecified site: Secondary | ICD-10-CM | POA: Diagnosis not present

## 2023-06-14 DIAGNOSIS — M6281 Muscle weakness (generalized): Secondary | ICD-10-CM | POA: Diagnosis not present

## 2023-06-19 DIAGNOSIS — M6281 Muscle weakness (generalized): Secondary | ICD-10-CM | POA: Diagnosis not present

## 2023-06-19 DIAGNOSIS — R2689 Other abnormalities of gait and mobility: Secondary | ICD-10-CM | POA: Diagnosis not present

## 2023-06-19 DIAGNOSIS — M1991 Primary osteoarthritis, unspecified site: Secondary | ICD-10-CM | POA: Diagnosis not present

## 2023-06-20 ENCOUNTER — Encounter: Payer: Self-pay | Admitting: Internal Medicine

## 2023-06-20 NOTE — Progress Notes (Unsigned)
 [facility] [address] [place] [address] Phone: [phone] Subjective:   IBryan Caprio, am serving as a scribe for Dr. Ronnell Coins.  I'm seeing this patient by the request  of:  Street, Renford Cartwright, MD  CC: Back and neck pain follow-up  [id number]  Kerry [name] is a 73 y.o. female coming in with complaint of back and neck pain. OMT on 04/10/2023. Patient states going over older things. Updates. Sam unit questions. Neck is bothersome.  Patient feels that the improvement with some aquatic therapy at the moment.  Medications patient has been prescribed:   Taking:         Reviewed prior external information including notes and imaging from previsou exam, outside providers and external EMR if available.   As well as notes that were available from care everywhere and other healthcare systems.  Past medical history, social, surgical and family history all reviewed in electronic medical record.  No pertanent information unless stated regarding to the chief complaint.   Past Medical History:  Diagnosis Date   Abnormal uterine bleeding    Allergic contact dermatitis due to metals    Allergic rhinitis, cause unspecified    Bursitis of left shoulder    Chronic pain syndrome    Complication of anesthesia    03-20-2023  pt stated has issues with memory post op   Coronary artery disease 01/2022   cardiologist--- dr Annie Barton;   positive PET NUC 01-24-2022;  cath 01-25-2022/  01-26-2022  multivessel CAD w/ severe global LV contractibility ef 55-60% LVEDP 30mm;    s/p  PTCA balloon mCX and OM2 and DES x1 to OM1   Dyspareunia    Endometrial mass    Family history of ovarian cancer    Full dentures    GERD (gastroesophageal reflux disease)    improved   History of adenomatous polyp of colon    History of cerebral infarction    neurologist--- dr Linnell Richardson. patel (lov note in epic 12-21-2021 for vertigo & HA)  MRI imaging incidental  finding remote small vessal infarct right basal ganglia   History of esophageal stricture    s/p  EGD w/ dilatation 2010 and 03-12-2018   History of kidney stones    HTN (hypertension)    Hyperlipidemia    Interstitial lung disease (HCC)    followed by dr Matilde Son ([facility] [place] [place])   long COVID related mild ILD with UIP pattern   Multiple pulmonary nodules    Neuromuscular disorder (HCC)    OA (osteoarthritis)    knees;  hands   OAB (overactive bladder)    OSA on CPAP    followed by pulmonology--- dr Matilde Son;   HST in epic 03-04-2015  moderate osa   Polyarthralgia    Pre-diabetes    S/P drug eluting coronary stent placement 01/26/2022   PTCA balloon angioplasty and DES x1 toOM1   Somatic dysfunction    multiple sites;    lumbar & sacral regions;  ribs;   cervical spine   Stroke (HCC) 11/2022   dx TIA   Urinary incontinence    Uterine fibroid     Allergies  Allergen Reactions   Meloxicam  Other (See Comments)    Made patient have stomach pain ,sore throat, joint pains, face flush    Molds & Smuts Other (See Comments), Palpitations, Shortness Of Breath and Swelling   Cefixime Swelling   Celebrex [Celecoxib] Swelling   Cobalt Other (See Comments)  Per allergist   Gabapentin  Other (See Comments)    Joint pain   Levofloxacin Other (See Comments)    Muscle and joint pain   Molybdenum Other (See Comments)    Per allergist   Other Other (See Comments)    TEQUIN. TEQUIN - JOINT AND MUSCLE PAIN Other Reaction: painful joints Tantal Metal   Penicillins Hives   Robaxin [Methocarbamol]    Statins Other (See Comments)    Muscle pain   Tomato Hives   Adhesive [Tape] Rash    Sensitive to EKG leads; sensitive to 8M Micropore tape   Dust Mite Extract Itching, Palpitations and Other (See Comments)   Tetracycline Hcl Rash    Can take Doxy   Tetracyclines & Related Rash     Review of Systems:  No headache, visual changes, nausea, vomiting, diarrhea, constipation,  dizziness, abdominal pain, skin rash, fevers, chills, night sweats, weight loss, swollen lymph nodes, body aches, joint swelling, chest pain, shortness of breath, mood changes. POSITIVE muscle aches  Objective  Blood pressure 132/84, pulse (!) 102, height 5' (1.524 m), weight 233 lb (105.7 kg), last menstrual period 08/16/1992, SpO2 96%.   General: No apparent distress alert and oriented x3 mood and affect normal, dressed appropriately.  HEENT: Pupils equal, extraocular movements intact  Respiratory: Patient's speak in full sentences and does not appear short of breath  Cardiovascular: No lower extremity edema, non tender, no erythema  MSK:  Back does have some loss lordosis noted.  Significant stiffness noted.  Antalgic gait noted using the walker.  Osteopathic findings  C3 flexed rotated and side bent right C6 flexed rotated and side bent left T5 extended rotated and side bent right inhaled rib T7 extended rotated and side bent left inhaled L2 flexed rotated and side bent right Sacrum right on right    Assessment and Plan:  Low back pain Chronic difficulty noted.  Discussed with patient again in length.  When symptoms wait before they would even consider possible surgery for the uterus at this time.  Patient is with a Mirena  at the moment and does seem that some of the swelling seems to be better.  Following up with cardiology.    Nonallopathic problems  Decision today to treat with OMT was based on Physical Exam  After verbal consent patient was treated with  ME, techniques in cervical, rib, thoracic, lumbar, and sacral  areas  Patient tolerated the procedure well with improvement in symptoms  Patient given exercises, stretches and lifestyle modifications  See medications in patient instructions if given  Patient will follow up in 4-8 weeks    The above documentation has been reviewed and is accurate and complete Kerry Crammer M Bunny Lowdermilk, DO          Note: This dictation  was prepared with Dragon dictation along with smaller phrase technology. Any transcriptional errors that result from this process are unintentional.

## 2023-06-21 ENCOUNTER — Ambulatory Visit (INDEPENDENT_AMBULATORY_CARE_PROVIDER_SITE_OTHER): Admitting: Family Medicine

## 2023-06-21 ENCOUNTER — Encounter: Payer: Self-pay | Admitting: Obstetrics and Gynecology

## 2023-06-21 ENCOUNTER — Encounter: Payer: Self-pay | Admitting: Family Medicine

## 2023-06-21 ENCOUNTER — Ambulatory Visit (INDEPENDENT_AMBULATORY_CARE_PROVIDER_SITE_OTHER): Admitting: Obstetrics and Gynecology

## 2023-06-21 VITALS — BP 132/84 | HR 102 | Ht 60.0 in | Wt 233.0 lb

## 2023-06-21 VITALS — BP 156/87 | HR 82

## 2023-06-21 DIAGNOSIS — N3281 Overactive bladder: Secondary | ICD-10-CM

## 2023-06-21 DIAGNOSIS — G8929 Other chronic pain: Secondary | ICD-10-CM

## 2023-06-21 DIAGNOSIS — M545 Low back pain, unspecified: Secondary | ICD-10-CM

## 2023-06-21 DIAGNOSIS — M9904 Segmental and somatic dysfunction of sacral region: Secondary | ICD-10-CM | POA: Diagnosis not present

## 2023-06-21 DIAGNOSIS — M9903 Segmental and somatic dysfunction of lumbar region: Secondary | ICD-10-CM | POA: Diagnosis not present

## 2023-06-21 DIAGNOSIS — M9901 Segmental and somatic dysfunction of cervical region: Secondary | ICD-10-CM | POA: Diagnosis not present

## 2023-06-21 DIAGNOSIS — M9908 Segmental and somatic dysfunction of rib cage: Secondary | ICD-10-CM | POA: Diagnosis not present

## 2023-06-21 DIAGNOSIS — M9902 Segmental and somatic dysfunction of thoracic region: Secondary | ICD-10-CM

## 2023-06-21 DIAGNOSIS — R350 Frequency of micturition: Secondary | ICD-10-CM

## 2023-06-21 NOTE — Patient Instructions (Signed)
 I will look up for the cpap. Try the exercise with the arm on the wall Try exercises forward facing. Follow up in 6 weeks.

## 2023-06-21 NOTE — Progress Notes (Signed)
 [facility]  PTNS VISIT  CC:  Overactive bladder  73 y.o. with refractory overactive bladder who presents for percutaneous tibial nerve stimulation. The patient presents for PTNS maintenance session # 4.   Procedure: The patient was placed in the sitting position and the left lower extremity was prepped in the usual fashion. The PTNS needle was then inserted at a 60 degree angle, 5 cm cephalad and 2 cm posterior to the medial malleolus. The PTNS unit was then programmed and an optimal response was noted at 4 milliamps. The PTNS stimulation was then performed at this setting for 30 minutes without incident and the patient tolerated the procedure well. The needle was removed and hemostasis was noted.   The pt will return in 6 week for PTNS maintenance session # 5. All questions were answered.

## 2023-06-21 NOTE — Assessment & Plan Note (Addendum)
 Chronic difficulty noted.  Discussed with patient again in length.  When symptoms wait before they would even consider possible surgery for the uterus at this time.  Patient is with a Mirena  at the moment and does seem that some of the swelling seems to be better.  Following up with cardiology.  Continues to have difficulty with other possibilities as well.  Discussed intermittent home exercises.  Increase activity slowly.  Follow-up again in 6 to 8 weeks

## 2023-06-26 DIAGNOSIS — R2689 Other abnormalities of gait and mobility: Secondary | ICD-10-CM | POA: Diagnosis not present

## 2023-06-26 DIAGNOSIS — M6281 Muscle weakness (generalized): Secondary | ICD-10-CM | POA: Diagnosis not present

## 2023-06-26 DIAGNOSIS — M1991 Primary osteoarthritis, unspecified site: Secondary | ICD-10-CM | POA: Diagnosis not present

## 2023-06-27 DIAGNOSIS — G4733 Obstructive sleep apnea (adult) (pediatric): Secondary | ICD-10-CM | POA: Diagnosis not present

## 2023-06-27 DIAGNOSIS — R0609 Other forms of dyspnea: Secondary | ICD-10-CM | POA: Diagnosis not present

## 2023-06-28 DIAGNOSIS — M6281 Muscle weakness (generalized): Secondary | ICD-10-CM | POA: Diagnosis not present

## 2023-06-28 DIAGNOSIS — M1991 Primary osteoarthritis, unspecified site: Secondary | ICD-10-CM | POA: Diagnosis not present

## 2023-06-28 DIAGNOSIS — R2689 Other abnormalities of gait and mobility: Secondary | ICD-10-CM | POA: Diagnosis not present

## 2023-07-02 NOTE — Progress Notes (Deleted)
 Cardiology Office Note:    Date:  07/02/2023   ID:  Kerry [name], DOB July 24, 1950, MRN [id number]  PCP:  Street, Renford Cartwright, MD  Cardiologist:  Ola Berger, MD    Pt presents for follow up of CAD   History of Present Illness:    Kerry [name] is a 73 y.o. female with hx of dyspnea, HTN, CAD   Calcium  score CT done Dec 2023   Ca score was 1051   Started on statin/ASA.   PET/CT stress test in 01/2022 showed  multiple perfusion defects   LHC in Jan 2024 showed multivessel CAD    She underwent PCI/DES to OM1 and PTCA to LC/OM2.  Platinum stent used due to concern of stent allergy She was placed on ASA and Plavix  for 6 months Note LVEDP at time of cath was 32 mm Hg  The pt was seen by Sharlene Dawn in later Jan 2024  Still had some SOB post procedure      I saw the pt in March 2024 Complained of SOB and fatigue     I saw the pt in clinic in Summer 2024 Fall 2024  Stopped statin  Felt better  She was just seen by Andi Kaufmann.  Working on approval for Repatha   Since seen the pt continues to complain of dyspnea on exertion. Denies CP Denies PND   Stable LE edema    I saw the pt in Dec 2024    She was seen by Annice Barthel in the interval      Past Medical History:  Diagnosis Date   Abnormal uterine bleeding    Allergic contact dermatitis due to metals    Allergic rhinitis, cause unspecified    Bursitis of left shoulder    Chronic pain syndrome    Complication of anesthesia    03-20-2023  pt stated has issues with memory post op   Coronary artery disease 01/2022   cardiologist--- dr Annie Barton;   positive PET NUC 01-24-2022;  cath 01-25-2022/  01-26-2022  multivessel CAD w/ severe global LV contractibility ef 55-60% LVEDP 30mm;    s/p  PTCA balloon mCX and OM2 and DES x1 to OM1   Dyspareunia    Endometrial mass    Family history of ovarian cancer    Full dentures    GERD (gastroesophageal reflux disease)    improved   History of adenomatous polyp of colon    History of  cerebral infarction    neurologist--- dr Linnell Richardson. patel (lov note in epic 12-21-2021 for vertigo & HA)  MRI imaging incidental finding remote small vessal infarct right basal ganglia   History of esophageal stricture    s/p  EGD w/ dilatation 2010 and 03-12-2018   History of kidney stones    HTN (hypertension)    Hyperlipidemia    Interstitial lung disease (HCC)    followed by dr Matilde Son ([facility] [place])   long COVID related mild ILD with UIP pattern   Multiple pulmonary nodules    Neuromuscular disorder (HCC)    OA (osteoarthritis)    knees;  hands   OAB (overactive bladder)    OSA on CPAP    followed by pulmonology--- dr Matilde Son;   HST in epic 03-04-2015  moderate osa   Polyarthralgia    Pre-diabetes    S/P drug eluting coronary stent placement 01/26/2022   PTCA balloon angioplasty and DES x1 toOM1   Somatic dysfunction    multiple sites;  lumbar & sacral regions;  ribs;   cervical spine   Stroke (HCC) 11/2022   dx TIA   Urinary incontinence    Uterine fibroid     Past Surgical History:  Procedure Laterality Date   CARPAL TUNNEL RELEASE Bilateral    left 05-21-2020;   right  1998   CATARACT EXTRACTION W/ INTRAOCULAR LENS IMPLANT Bilateral 2018   COLONOSCOPY WITH ESOPHAGOGASTRODUODENOSCOPY (EGD)  2023   [facility]   COLONOSCOPY WITH ESOPHAGOGASTRODUODENOSCOPY (EGD) AND ESOPHAGEAL DILATION (ED)  03/12/2018   @ [facility]   CORONARY STENT INTERVENTION N/A 01/26/2022   Procedure: CORONARY STENT INTERVENTION;  Surgeon: Odie Benne, MD;  Location: [facility];  Service: Cardiovascular;  Laterality: N/A;   DILATATION & CURETTAGE/HYSTEROSCOPY WITH MYOSURE N/A 08/22/2021   Procedure: DILATATION & CURETTAGE/HYSTEROSCOPY WITH MYOSURE RESECTION OF ENDOMETRIAL MASS;  Surgeon: Greta Leatherwood, MD;  Location: [facility];  Service: Gynecology;  Laterality: N/A;   DILATATION & CURETTAGE/HYSTEROSCOPY WITH MYOSURE N/A  03/21/2023   Procedure: DILATATION & CURETTAGE/HYSTEROSCOPY WITH MYOSURE;  Surgeon: Romaine Closs, MD;  Location: [facility];  Service: Gynecology;  Laterality: N/A;  [facility]   DILATION AND CURETTAGE OF UTERUS  2000   HAMMER TOE SURGERY Right 1990   HYSTEROSCOPY WITH D & C N/A 04/25/2023   Procedure: DILATATION AND CURETTAGE /HYSTEROSCOPY;  Surgeon: Suzi Essex, MD;  Location: [facility];  Service: Gynecology;  Laterality: N/A;  MYOSURE   INTRAUTERINE DEVICE (IUD) INSERTION N/A 04/25/2023   Procedure: INSERTION, INTRAUTERINE DEVICE;  Surgeon: Suzi Essex, MD;  Location: [facility];  Service: Gynecology;  Laterality: N/A;   LEFT HEART CATH AND CORONARY ANGIOGRAPHY N/A 01/25/2022   Procedure: LEFT HEART CATH AND CORONARY ANGIOGRAPHY;  Surgeon: Millicent Ally, MD;  Location: [facility];  Service: Cardiovascular;  Laterality: N/A;   NASAL SEPTUM SURGERY  2000   OPERATIVE ULTRASOUND N/A 08/22/2021   Procedure: OPERATIVE ULTRASOUND GUIDED HYSTEROSCOPY;  Surgeon: Greta Leatherwood, MD;  Location: [facility];  Service: Gynecology;  Laterality: N/A;   PELVIC LAPAROSCOPY  1990   remembers pelvic pain, normal findings   ROTATOR CUFF REPAIR Left 11/11/2013   @ [facility]  by  Dr. Deeann Fare   SHOULDER ARTHROSCOPY W/ ROTATOR CUFF REPAIR Right 10/09/2011   @[facility]  by Dr. Deeann Fare   UMBILICAL HERNIA REPAIR  2010   Patient reports mesh was used for the repair.    Current Medications: No outpatient medications have been marked as taking for the 07/03/23 encounter (Appointment) with Elmyra Haggard, MD.     Allergies:   Meloxicam , Molds & smuts, Cefixime, Celebrex [celecoxib], Cobalt, Gabapentin , Levofloxacin, Molybdenum, Other, Penicillins, Robaxin [methocarbamol], Statins, Tomato, Adhesive [tape], Dust mite extract, Tetracycline hcl, and Tetracyclines & related   Social History   Socioeconomic History   Marital status: Married    Spouse name: Not on file   Number of children: Not  on file   Years of education: Not on file   Highest education level: Not on file  Occupational History   Occupation: Retired  Tobacco Use   Smoking status: Never   Smokeless tobacco: Never  Vaping Use   Vaping status: Never Used  Substance and Sexual Activity   Alcohol  use: No    Alcohol /week: 0.0 standard drinks of alcohol    Drug use: Never   Sexual activity: Not Currently    Partners: Male    Birth control/protection: Post-menopausal  Other Topics  Concern   Not on file  Social History Narrative   Are you right handed or left handed? Right handed   Are you currently employed ? No retired   What is your current occupation?   Do you live at home alone? No   Who lives with you? Lives with husband.    What type of home do you live in: 1 story or 2 story? Two story home.       Social Drivers of Corporate investment banker Strain: Not on file  Food Insecurity: Low Risk  (06/27/2023)   Received from [facility]   Hunger Vital Sign    Within the past 12 months, you worried that your food would run out before you got money to buy more: Never true    Within the past 12 months, the food you bought just didn't last and you didn't have money to get more. : Never true  Transportation Needs: No Transportation Needs (06/27/2023)   Received from [facility]    In the past 12 months, has lack of reliable transportation kept you from medical appointments, meetings, work or from getting things needed for daily living? : No  Physical Activity: Not on file  Stress: Not on file  Social Connections: Not on file     Family History: The patient's family history includes Arthritis in her sister; Basal cell carcinoma in her father; Cancer (age of onset: 4) in her niece; Crohn's disease in her brother; Diabetes in her mother; Glaucoma in her brother; Heart attack in her father; Kidney failure in her brother; Ovarian cancer (age of onset: 15) in her sister; Rheumatologic disease  in her mother and sister; Ulcerative colitis in her brother. There is no history of Breast cancer, Prostate cancer, Colon cancer, Pancreatic cancer, or Endometrial cancer. ROS:   Please see the history of present illness.    All 14 point review of systems negative except as described per history of present illness  EKGs/Labs/Other Studies Reviewed:    EKG   Not done today   Recent Labs: 11/23/2022: ALT 17 01/03/2023: NT-Pro BNP 160 04/20/2023: BUN 23; Creatinine, Ser 0.69; Hemoglobin 14.7; Platelets 213; Potassium 3.9; Sodium 140  Recent Lipid Panel    Component Value Date/Time   CHOL 134 03/12/2023 1049   TRIG 153 (H) 03/12/2023 1049   HDL 50 03/12/2023 1049   CHOLHDL 2.7 03/12/2023 1049   CHOLHDL 5.6 02/09/2011 1027   VLDL 27 02/09/2011 1027   LDLCALC 58 03/12/2023 1049    Physical Exam:    VS:  BP 142/84  P 98  Sats RA 98%    Wt 240 lb      Wt Readings from Last 3 Encounters:  06/21/23 233 lb (105.7 kg)  04/25/23 236 lb 1.8 oz (107.1 kg)  04/20/23 236 lb 1.8 oz (107.1 kg)     GEN: Morbidly obese 73 yo in no acute distress  Examined in chair NECK: No obvious JVD; No carotid bruit CARDIAC: RRR, no murmur Distant  RESPIRATORY:  Clear to auscultation  No wheezes or rates  ABDOMEN:  OBese  MUSCULOSKELETAL:  Tr  LE  edema  Stent intervention 01/26/22    Mid Cx lesion is 80% stenosed.   Prox Cx lesion is 20% stenosed.   2nd Diag lesion is 60% stenosed.   3rd Mrg lesion is 90% stenosed.   A drug-eluting stent was successfully placed using a SYNERGY XD 2.25X20.   Balloon angioplasty was performed using  a BALLN SAPPHIRE 2.0X12.   Post intervention, there is a 0% residual stenosis.   Post intervention, there is a 60% residual stenosis.   Severe stenosis in the mid Circumflex affecting flow into the first obtuse marginal branch and the second obtuse marginal branch.  Successful PTCA/DES x 1 OM1 (Synergy stent platform as felt to be the most favorable given her metal allergy  profile) Successful balloon angioplasty mid Circumflex/OM2   Recommendations: DAPT with ASA and Plavix  for at least six months.    L heart cath  Jan 2024      2nd Diag lesion is 60% stenosed.   Mid Cx lesion is 80% stenosed.   3rd Mrg lesion is 90% stenosed.   Prox Cx lesion is 20% stenosed.   Prox RCA lesion is 40% stenosed.   Mid RCA lesion is 10% stenosed.   Dist RCA lesion is 20% stenosed.   RPDA-1 lesion is 30% stenosed.   RPDA-2 lesion is 70% stenosed.   LV end diastolic pressure is severely elevated.   Multivessel CAD with 60% ostial narrowing of the second diagonal branch of the LAD; 20% proximal circumflex stenosis with mid circumflex bifurcation stenosis of 80 and 90%; dominant RCA with 40% proximal stenosis, mild 10 and 20% mid stenoses, and 30 and 70% mid and distal PDA stenosis.   Serve global LV contractility with EF estimated 55 to 60%.  Elevated LVEDP at 30 mm.   Significant blood pressure elevation for which the patient received hydralazine  10 mg x 2 post procedure.   RECOMMENDATION: Patient most likely will need PCI for her bifurcation circumflex stenosis.  She was very concerned about potential metal allergies.  We reviewed the stents with her and her concern with the molybdenum which is present in both the Synergy and Medtronic stents.  The Synergy stent also has platinum chromium in the Resolute stent platinum iridium in addition to cobalt alloy inner core.  Will discuss with his representatives.  Plan intervention tomorrow once further discussion obtained with patient family and device reps.    Impression     1  Dyspnea  Pt continues to have dyspnea.   Review of cath her LVEDP as significantly elevated (32)    Her weight today is up even higher today than it was earlier this yeare   Exam difficult given pt's size.  She may do well with with lasix  40 (with 20 KCL)   every other day  I would recomm starting Check BMET and BNP in 1 week      2  CAD  Pt s/p  PTCA/STent and PCI in Jan 2024    Follow     3  HTN  BP is a little high  Follow with lasix    4   HL  Intolerant to statins   Working on approval for Repatha   5    OSA   Continue CPAP  Encouraged her to get active in the pool  Medication Adjustments/Labs and Tests Ordered: Current medicines are reviewed at length with the patient today.  Concerns regarding medicines are outlined above.  No orders of the defined types were placed in this encounter.  Medication changes: No orders of the defined types were placed in this encounter.   Signed, Manfred Seed, MD, [facility] 07/02/2023 7:26 PM    [facility]

## 2023-07-03 ENCOUNTER — Ambulatory Visit: Admitting: Internal Medicine

## 2023-07-03 NOTE — Telephone Encounter (Signed)
 Left a message to see if the pt would like to come in today to see Dr Avanell Bob.

## 2023-07-05 DIAGNOSIS — M6281 Muscle weakness (generalized): Secondary | ICD-10-CM | POA: Diagnosis not present

## 2023-07-05 DIAGNOSIS — R2689 Other abnormalities of gait and mobility: Secondary | ICD-10-CM | POA: Diagnosis not present

## 2023-07-05 DIAGNOSIS — M1991 Primary osteoarthritis, unspecified site: Secondary | ICD-10-CM | POA: Diagnosis not present

## 2023-07-10 DIAGNOSIS — R2689 Other abnormalities of gait and mobility: Secondary | ICD-10-CM | POA: Diagnosis not present

## 2023-07-10 DIAGNOSIS — M1991 Primary osteoarthritis, unspecified site: Secondary | ICD-10-CM | POA: Diagnosis not present

## 2023-07-10 DIAGNOSIS — M6281 Muscle weakness (generalized): Secondary | ICD-10-CM | POA: Diagnosis not present

## 2023-07-17 DIAGNOSIS — M1991 Primary osteoarthritis, unspecified site: Secondary | ICD-10-CM | POA: Diagnosis not present

## 2023-07-17 DIAGNOSIS — M6281 Muscle weakness (generalized): Secondary | ICD-10-CM | POA: Diagnosis not present

## 2023-07-17 DIAGNOSIS — R2689 Other abnormalities of gait and mobility: Secondary | ICD-10-CM | POA: Diagnosis not present

## 2023-07-23 ENCOUNTER — Other Ambulatory Visit: Payer: Self-pay | Admitting: Internal Medicine

## 2023-07-24 DIAGNOSIS — M6281 Muscle weakness (generalized): Secondary | ICD-10-CM | POA: Diagnosis not present

## 2023-07-24 DIAGNOSIS — M1991 Primary osteoarthritis, unspecified site: Secondary | ICD-10-CM | POA: Diagnosis not present

## 2023-07-24 DIAGNOSIS — R2689 Other abnormalities of gait and mobility: Secondary | ICD-10-CM | POA: Diagnosis not present

## 2023-07-26 ENCOUNTER — Encounter: Payer: Self-pay | Admitting: Internal Medicine

## 2023-07-26 ENCOUNTER — Other Ambulatory Visit: Payer: Self-pay | Admitting: Internal Medicine

## 2023-07-26 DIAGNOSIS — M6281 Muscle weakness (generalized): Secondary | ICD-10-CM | POA: Diagnosis not present

## 2023-07-26 DIAGNOSIS — R2689 Other abnormalities of gait and mobility: Secondary | ICD-10-CM | POA: Diagnosis not present

## 2023-07-26 DIAGNOSIS — M1991 Primary osteoarthritis, unspecified site: Secondary | ICD-10-CM | POA: Diagnosis not present

## 2023-07-26 MED ORDER — AMLODIPINE BESYLATE 5 MG PO TABS: 5.0000 mg | ORAL_TABLET | Freq: Every day | ORAL | 0 refills | Status: DC

## 2023-07-27 ENCOUNTER — Ambulatory Visit (INDEPENDENT_AMBULATORY_CARE_PROVIDER_SITE_OTHER): Admitting: Obstetrics and Gynecology

## 2023-07-27 ENCOUNTER — Encounter: Payer: Self-pay | Admitting: Gynecologic Oncology

## 2023-07-27 ENCOUNTER — Encounter: Payer: Self-pay | Admitting: Obstetrics and Gynecology

## 2023-07-27 DIAGNOSIS — N3281 Overactive bladder: Secondary | ICD-10-CM | POA: Diagnosis not present

## 2023-07-27 DIAGNOSIS — R350 Frequency of micturition: Secondary | ICD-10-CM

## 2023-07-27 MED ORDER — AMLODIPINE BESYLATE 5 MG PO TABS: 5.0000 mg | ORAL_TABLET | Freq: Every day | ORAL | 0 refills | Status: DC

## 2023-07-27 NOTE — Progress Notes (Unsigned)
 [facility]  PTNS VISIT  CC:  Overactive bladder  73 y.o. with refractory overactive bladder who presents for percutaneous tibial nerve stimulation. The patient presents for PTNS session # ***.   Procedure: The patient was placed in the sitting position and the right lower extremity was prepped in the usual fashion. The PTNS needle was then inserted at a 60 degree angle, 5 cm cephalad and 2 cm posterior to the medial malleolus. The PTNS unit was then programmed and an optimal response was noted at 8 milliamps. The PTNS stimulation was then performed at this setting for 30 minutes without incident and the patient tolerated the procedure well. The needle was removed and hemostasis was noted.   The pt will return in 6 weeks for PTNS session # ***. All questions were answered.

## 2023-07-30 ENCOUNTER — Encounter: Payer: Self-pay | Admitting: Obstetrics and Gynecology

## 2023-07-31 DIAGNOSIS — M6281 Muscle weakness (generalized): Secondary | ICD-10-CM | POA: Diagnosis not present

## 2023-07-31 DIAGNOSIS — M1991 Primary osteoarthritis, unspecified site: Secondary | ICD-10-CM | POA: Diagnosis not present

## 2023-07-31 DIAGNOSIS — R2689 Other abnormalities of gait and mobility: Secondary | ICD-10-CM | POA: Diagnosis not present

## 2023-08-02 ENCOUNTER — Telehealth: Payer: Self-pay

## 2023-08-02 NOTE — Telephone Encounter (Signed)
 Moved patient appointment from 4pm on 7/18 to 2:30pm with arrival time at 2:15pm..patient confirmed

## 2023-08-03 ENCOUNTER — Inpatient Hospital Stay: Admitting: Gynecologic Oncology

## 2023-08-03 ENCOUNTER — Ambulatory Visit: Admitting: Obstetrics and Gynecology

## 2023-08-03 ENCOUNTER — Encounter: Payer: Self-pay | Admitting: Gynecologic Oncology

## 2023-08-03 ENCOUNTER — Inpatient Hospital Stay: Attending: Gynecologic Oncology | Admitting: Gynecologic Oncology

## 2023-08-03 VITALS — BP 164/82 | HR 94 | Temp 98.6°F | Resp 20 | Wt 233.0 lb

## 2023-08-03 DIAGNOSIS — Z79890 Hormone replacement therapy (postmenopausal): Secondary | ICD-10-CM | POA: Diagnosis not present

## 2023-08-03 DIAGNOSIS — Z975 Presence of (intrauterine) contraceptive device: Secondary | ICD-10-CM | POA: Insufficient documentation

## 2023-08-03 DIAGNOSIS — N8502 Endometrial intraepithelial neoplasia [EIN]: Secondary | ICD-10-CM | POA: Diagnosis not present

## 2023-08-03 NOTE — Patient Instructions (Signed)
 It was great to see you today.  We will contact you with biopsy results early next week.  Tentative plan will be for a visit in 3 months with a repeat biopsy.  Please call with any new or concerning symptoms.

## 2023-08-03 NOTE — Progress Notes (Signed)
 Gynecologic Oncology Return Clinic Visit  08/03/23  Reason for Visit: follow-up  Treatment History: Presented with PMB in 2023.  Pelvic ultrasound in 06/2021 showed multiple uterine and likely cervical leiomyoma, largest 3.6 cm.  08/2021: D&C hysteroscopy with benign polyp and submucosal fibroid sampling. No hyperplasia or malignancy. 11/22/22: EMB with scant atrophic endometrium. 02/06/23: SIS with filling defect:  31 x 10 mm, and thickened endometrium. EMB with benign endometrium.  03/21/23: Hysteroscopy, endometrial sampling. Findings of endometrial polyp.  Pathology - scanty inactive endometrium, benign endocervix. Endometrial polypectomy with [name] involving fragments of polyp, microscopic foci borderline on well-differentiated endometrioid adenocarcinoma.    04/25/23: Hysteroscopy, endometrial sampling, Mirena  IUD insertion   Interval History: Doing well.  Reports had multiple episodes of bleeding until early June.  Denies any significant bleeding since then.  Was having some cramping when she had episodes of bleeding.  Her primary care provider.  Plan had been to work on increasing physical activity and improving physical conditioning.  She is doing physical therapy, [place] .  Past Medical/Surgical History: Past Medical History:  Diagnosis Date   Abnormal uterine bleeding    Allergic contact dermatitis due to metals    Allergic rhinitis, cause unspecified    Bursitis of left shoulder    Chronic pain syndrome    Complication of anesthesia    03-20-2023  pt stated has issues with memory post op   Coronary artery disease 01/2022   cardiologist--- dr myrtis gull;   positive PET NUC 01-24-2022;  cath 01-25-2022/  01-26-2022  multivessel CAD w/ severe global LV contractibility ef 55-60% LVEDP 30mm;    s/p  PTCA balloon mCX and OM2 and DES x1 to OM1   Dyspareunia    Endometrial mass    Family history of ovarian cancer    Full dentures    GERD (gastroesophageal reflux disease)     improved   History of adenomatous polyp of colon    History of cerebral infarction    neurologist--- dr marla. patel (lov note in epic 12-21-2021 for vertigo & HA)  MRI imaging incidental finding remote small vessal infarct right basal ganglia   History of esophageal stricture    s/p  EGD w/ dilatation 2010 and 03-12-2018   History of kidney stones    HTN (hypertension)    Hyperlipidemia    Interstitial lung disease (HCC)    followed by dr shellia ([facility] [place] [place])   long COVID related mild ILD with UIP pattern   Multiple pulmonary nodules    Neuromuscular disorder (HCC)    OA (osteoarthritis)    knees;  hands   OAB (overactive bladder)    OSA on CPAP    followed by pulmonology--- dr shellia;   HST in epic 03-04-2015  moderate osa   Polyarthralgia    Pre-diabetes    S/P drug eluting coronary stent placement 01/26/2022   PTCA balloon angioplasty and DES x1 toOM1   Somatic dysfunction    multiple sites;    lumbar & sacral regions;  ribs;   cervical spine   Stroke (HCC) 11/2022   dx TIA   Urinary incontinence    Uterine fibroid     Past Surgical History:  Procedure Laterality Date   CARPAL TUNNEL RELEASE Bilateral    left 05-21-2020;   right  1998   CATARACT EXTRACTION W/ INTRAOCULAR LENS IMPLANT Bilateral 2018   COLONOSCOPY WITH ESOPHAGOGASTRODUODENOSCOPY (EGD)  [facility]23   [facility] [place]   COLONOSCOPY WITH ESOPHAGOGASTRODUODENOSCOPY (EGD)  AND ESOPHAGEAL DILATION (ED)  03/12/2018   @ [facility]   CORONARY STENT INTERVENTION N/A 01/26/2022   Procedure: CORONARY STENT INTERVENTION;  Surgeon: Verlin Lonni BIRCH, MD;  Location: [facility];  Service: Cardiovascular;  Laterality: N/A;   DILATATION & CURETTAGE/HYSTEROSCOPY WITH MYOSURE N/A 08/22/2021   Procedure: DILATATION & CURETTAGE/HYSTEROSCOPY WITH MYOSURE RESECTION OF ENDOMETRIAL MASS;  Surgeon: Cathlyn JAYSON Nikki Bobie FORBES, MD;  Location: [facility];  Service: Gynecology;   Laterality: N/A;   DILATATION & CURETTAGE/HYSTEROSCOPY WITH MYOSURE N/A 03/21/2023   Procedure: DILATATION & CURETTAGE/HYSTEROSCOPY WITH MYOSURE;  Surgeon: Dallie Vera GAILS, MD;  Location: [facility];  Service: Gynecology;  Laterality: N/A;  [facility]   DILATION AND CURETTAGE OF UTERUS  2000   HAMMER TOE SURGERY Right 1990   HYSTEROSCOPY WITH D & C N/A 04/25/2023   Procedure: DILATATION AND CURETTAGE /HYSTEROSCOPY;  Surgeon: Viktoria Comer SAUNDERS, MD;  Location: [facility];  Service: Gynecology;  Laterality: N/A;  MYOSURE   INTRAUTERINE DEVICE (IUD) INSERTION N/A 04/25/2023   Procedure: INSERTION, INTRAUTERINE DEVICE;  Surgeon: Viktoria Comer SAUNDERS, MD;  Location: [facility];  Service: Gynecology;  Laterality: N/A;   LEFT HEART CATH AND CORONARY ANGIOGRAPHY N/A 01/25/2022   Procedure: LEFT HEART CATH AND CORONARY ANGIOGRAPHY;  Surgeon: Burnard Debby LABOR, MD;  Location: [facility];  Service: Cardiovascular;  Laterality: N/A;   NASAL SEPTUM SURGERY  2000   OPERATIVE ULTRASOUND N/A 08/22/2021   Procedure: OPERATIVE ULTRASOUND GUIDED HYSTEROSCOPY;  Surgeon: Cathlyn JAYSON Nikki Bobie FORBES, MD;  Location: [facility];  Service: Gynecology;  Laterality: N/A;   PELVIC LAPAROSCOPY  1990   remembers pelvic pain, normal findings   ROTATOR CUFF REPAIR Left 11/11/2013   @ [facility]  by  Dr. Dozier   SHOULDER ARTHROSCOPY W/ ROTATOR CUFF REPAIR Right 10/09/2011   @[facility]  by Dr. Dozier   UMBILICAL HERNIA REPAIR  2010   Patient reports mesh was used for the repair.    Family History  Problem Relation Age of Onset   Rheumatologic disease Mother    Diabetes Mother    Heart attack Father    Basal cell carcinoma Father    Rheumatologic disease Sister    Arthritis Sister        --Rheumatoid arthritis   Ovarian cancer Sister 100       hyst in 20s, surgery in 39s, no chemo, still alive   Crohn's disease Brother    Kidney failure Brother    Ulcerative colitis Brother    Glaucoma Brother    Cancer Niece 49       gyn    Breast cancer Neg Hx    Prostate cancer Neg Hx    Colon cancer Neg Hx    Pancreatic cancer Neg Hx    Endometrial cancer Neg Hx     Social History   Socioeconomic History   Marital status: Married    Spouse name: Not on file   Number of children: Not on file   Years of education: Not on file   Highest education level: Not on file  Occupational History   Occupation: Retired  Tobacco Use   Smoking status: Never   Smokeless tobacco: Never  Vaping Use   Vaping status: Never Used  Substance and Sexual Activity   Alcohol  use: No    Alcohol /week: 0.0 standard drinks of alcohol    Drug use: Never   Sexual activity: Not Currently    Partners: Male    Birth control/protection:  Post-menopausal  Other Topics Concern   Not on file  Social History Narrative   Are you right handed or left handed? Right handed   Are you currently employed ? No retired   What is your current occupation?   Do you live at home alone? No   Who lives with you? Lives with husband.    What type of home do you live in: 1 story or 2 story? Two story home.       Social Drivers of Corporate investment banker Strain: Not on file  Food Insecurity: Low Risk  (06/27/2023)   Received from [facility]   Hunger Vital Sign    Within the past 12 months, you worried that your food would run out before you got money to buy more: Never true    Within the past 12 months, the food you bought just didn't last and you didn't have money to get more. : Never true  Transportation Needs: No Transportation Needs (06/27/2023)   Received from [facility]    In the past 12 months, has lack of reliable transportation kept you from medical appointments, meetings, work or from getting things needed for daily living? : No  Physical Activity: Not on file  Stress: Not on file  Social Connections: Not on file    Current Medications:  Current Outpatient Medications:    amLODipine  (NORVASC ) 5 MG tablet, Take 1  tablet (5 mg total) by mouth daily. Must keep appointment with Josefa Beauvais, NP in August for future refills., Disp: 30 tablet, Rfl: 0   ascorbic acid (VITAMIN C ) 500 MG tablet, Take 250 mg by mouth daily., Disp: , Rfl:    aspirin  EC 81 MG tablet, Take 1 tablet (81 mg total) by mouth daily. Swallow whole., Disp: 100 tablet, Rfl: 3   BLACK CURRANT SEED OIL PO, Take by mouth., Disp: , Rfl:    carboxymethylcellulose (REFRESH PLUS) 0.5 % SOLN, Place 1 drop into both eyes 2 (two) times daily as needed (for dry eyes)., Disp: , Rfl:    Coenzyme Q10 200 MG capsule, Take 200 mg by mouth daily after lunch., Disp: , Rfl:    cyanocobalamin  (VITAMIN B12) 1000 MCG tablet, Take 1,000 mcg by mouth every Monday, Wednesday, and Friday., Disp: , Rfl:    cyanocobalamin  (VITAMIN B12) 1000 MCG/ML injection, Inject 1,000 mcg into the muscle every 30 (thirty) days., Disp: , Rfl:    Evolocumab  (REPATHA  SURECLICK) 140 MG/ML SOAJ, Inject 140 mg into the skin every 14 (fourteen) days., Disp: 2 mL, Rfl: 11   FIBER ADULT GUMMIES PO, Take 2-3 each by mouth 2 (two) times daily as needed (constipation)., Disp: , Rfl:    furosemide  (LASIX ) 40 MG tablet, Take 1 tablet (40 mg total) by mouth every other day., Disp: 90 tablet, Rfl: 3   metoprolol succinate (TOPROL-XL) 25 MG 24 hr tablet, Take 25 mg by mouth daily after lunch., Disp: , Rfl:    Multiple Vitamins-Minerals (SUPER MULTI-VITAMIN PO), Take 1 capsule by mouth daily. Solgar Formula VM-75, Disp: , Rfl:    nitroGLYCERIN  (NITROSTAT ) 0.4 MG SL tablet, Place 1 tablet (0.4 mg total) under the tongue every 5 (five) minutes x 3 doses as needed for chest pain., Disp: 25 tablet, Rfl: 1   Omega-3 Fatty Acids (FISH OIL) 500 MG CAPS, Take 500 mg by mouth daily. Taking 1,00mg  every other day, Disp: , Rfl:    potassium chloride  SA (KLOR-CON  M) 20 MEQ tablet, Take 1 tablet (  20 mEq total) by mouth every other day., Disp: 90 tablet, Rfl: 3   Probiotic Product (PROBIOTIC ADVANCED PO), Take 1  capsule by mouth daily.  Suprema Dophilus probiotic, Disp: , Rfl:    Sodium Chloride -Xylitol (XLEAR SINUS CARE SPRAY) SOLN, Place 2 sprays into the nose at bedtime. Rinse only, Disp: , Rfl:    TART CHERRY PO, Take 1 tablet by mouth daily. Chewable, Disp: , Rfl:    triamcinolone  cream (KENALOG) 0.1 %, Apply 1 Application topically 2 (two) times daily as needed (eczema). (Patient not taking: Reported on 08/01/2023), Disp: , Rfl:    TURMERIC-GINGER PO, Take 1 capsule by mouth 2 (two) times daily., Disp: , Rfl:    VITAMIN D -VITAMIN K PO, Take 1 capsule by mouth every other day. Vit d 5000 units and 100mcg of K, Disp: , Rfl:   Review of Systems: Denies appetite changes, fevers, chills, fatigue, unexplained weight changes. Denies hearing loss, neck lumps or masses, mouth sores, ringing in ears or voice changes. Denies cough or wheezing.  Denies shortness of breath. Denies chest pain or palpitations. Denies leg swelling. Denies abdominal distention, pain, blood in stools, constipation, diarrhea, nausea, vomiting, or early satiety. Denies pain with intercourse, dysuria, frequency, hematuria or incontinence. Denies hot flashes, pelvic pain, vaginal bleeding or vaginal discharge.   Denies joint pain, back pain or muscle pain/cramps. Denies itching, rash, or wounds. Denies dizziness, headaches, numbness or seizures. Denies swollen lymph nodes or glands, denies easy bruising or bleeding. Denies anxiety, depression, confusion, or decreased concentration.  Physical Exam: BP (!) 172/70 (BP Location: Left Arm, Patient Position: Sitting)   Pulse 94   Temp 98.6 F (37 C) (Oral)   Resp 20   Wt 233 lb (105.7 kg)   LMP 08/16/1992   SpO2 96%   BMI 45.50 kg/m  General: Alert, oriented, no acute distress. HEENT: Posterior oropharynx clear, sclera anicteric. Chest: Unlabored breathing on room air.  GU: Normal appearing external genitalia without erythema, excoriation, or lesions.  Speculum exam reveals  vaginal mucosa normal-appearing, cervix normal.  IUD strings protruding approximately 4 cm from the cervical os.  Endometrial biopsy procedure Preoperative diagnosis: [name] Postoperative diagnosis: Same as above Physician: Viktoria MD Estimated blood loss: Minimal Specimens: Endometrial biopsy Procedure: After the procedure was discussed with the patient including risks and benefits, she gave verbal consent.  She was then placed in dorsolithotomy position and a speculum was placed in the vagina.  Once the cervix was well visualized it was cleansed with Betadine  x3.  An endometrial Pipelle was then passed to a depth of just under 8cm.  2 passes were performed with scant tissue obtained.  This was placed in formalin.  Overall the patient tolerated the procedure well.  All instruments were removed from the vagina.  Laboratory & Radiologic Studies: None new  Assessment & Plan: Kerry [name] is a 73 y.o. woman with [name] now s/p D&C (04/25/23) with Mirena  IUD placement.   Patient doing well.  No bleeding in approximately 5-6 weeks.  Endometrial biopsy today, will contact her with results.  Tentative plan for follow-up in 3 months with repeat biopsy.  Encouragement given for continued work towards weight loss.  20 minutes of total time was spent for this patient encounter, including preparation, face-to-face counseling with the patient and coordination of care, and documentation of the encounter.  Comer Viktoria, MD  Division of Gynecologic Oncology  Department of Obstetrics and Gynecology  [facility]

## 2023-08-07 NOTE — Progress Notes (Unsigned)
 [name] [address] [place] [address] Phone: [phone] Subjective:   Kerry [name], am serving as a scribe for Dr. Arthea Claudene.  I'm seeing this patient by the request  of:  Street, Lonni HERO, MD  CC: Multiple joint complaint  [name]  Kerry [name] is a 73 y.o. female coming in with complaint of back and neck pain. OMT on 06/21/2023. Patient states that she is the same as last visit. Making improvement with PT.          Reviewed prior external information including notes and imaging from previsou exam, outside providers and external EMR if available.   As well as notes that were available from care everywhere and other healthcare systems.  Past medical history, social, surgical and family history all reviewed in electronic medical record.  No pertanent information unless stated regarding to the chief complaint.   Past Medical History:  Diagnosis Date   Abnormal uterine bleeding    Allergic contact dermatitis due to metals    Allergic rhinitis, cause unspecified    Bursitis of left shoulder    Chronic pain syndrome    Complication of anesthesia    03-20-2023  pt stated has issues with memory post op   Coronary artery disease 01/2022   cardiologist--- dr myrtis gull;   positive PET NUC 01-24-2022;  cath 01-25-2022/  01-26-2022  multivessel CAD w/ severe global LV contractibility ef 55-60% LVEDP 30mm;    s/p  PTCA balloon mCX and OM2 and DES x1 to OM1   Dyspareunia    Endometrial mass    Family history of ovarian cancer    Full dentures    GERD (gastroesophageal reflux disease)    improved   History of adenomatous polyp of colon    History of cerebral infarction    neurologist--- dr marla. patel (lov note in epic 12-21-2021 for vertigo & HA)  MRI imaging incidental finding remote small vessal infarct right basal ganglia   History of esophageal stricture    s/p  EGD w/ dilatation 2010 and 03-12-2018   History  of kidney stones    HTN (hypertension)    Hyperlipidemia    Interstitial lung disease (HCC)    followed by dr shellia ([facility] [place] [place])   long COVID related mild ILD with UIP pattern   Multiple pulmonary nodules    Neuromuscular disorder (HCC)    OA (osteoarthritis)    knees;  hands   OAB (overactive bladder)    OSA on CPAP    followed by pulmonology--- dr shellia;   HST in epic 03-04-2015  moderate osa   Polyarthralgia    Pre-diabetes    S/P drug eluting coronary stent placement 01/26/2022   PTCA balloon angioplasty and DES x1 toOM1   Somatic dysfunction    multiple sites;    lumbar & sacral regions;  ribs;   cervical spine   Stroke (HCC) 11/2022   dx TIA   Urinary incontinence    Uterine fibroid     Allergies  Allergen Reactions   Meloxicam  Other (See Comments)    Made patient have stomach pain ,sore throat, joint pains, face flush    Molds & Smuts Other (See Comments), Palpitations, Shortness Of Breath and Swelling   Cefixime Swelling   Celebrex [Celecoxib] Swelling   Cobalt Other (See Comments)    Per allergist   Gabapentin  Other (See Comments)    Joint pain   Levofloxacin Other (See Comments)  Muscle and joint pain   Molybdenum Other (See Comments)    Per allergist   Other Other (See Comments)    TEQUIN. TEQUIN - JOINT AND MUSCLE PAIN Other Reaction: painful joints Tantal Metal   Penicillins Hives   Robaxin [Methocarbamol]    Statins Other (See Comments)    Muscle pain   Tomato Hives   Adhesive [Tape] Rash    Sensitive to EKG leads; sensitive to 50M Micropore tape   Dust Mite Extract Itching, Palpitations and Other (See Comments)   Tetracycline Hcl Rash    Can take Doxy   Tetracyclines & Related Rash     Review of Systems:  No headache, visual changes, nausea, vomiting, diarrhea, constipation, dizziness, abdominal pain, skin rash, fevers, chills, night sweats, weight loss, swollen lymph nodes, body aches, joint swelling, chest pain, shortness of  breath, mood changes. POSITIVE muscle aches  Objective  Blood pressure 132/72, pulse 96, height 5' (1.524 m), weight 233 lb (105.7 kg), last menstrual period 08/16/1992, SpO2 99%.   General: No apparent distress alert and oriented x3 mood and affect normal, dressed appropriately.  HEENT: Pupils equal, extraocular movements intact  Respiratory: Patient's speak in full sentences and does not appear short of breath  Cardiovascular: No lower extremity edema, non tender, no erythema  Gait MSK:  Back does have some loss of lordosis noted.  Significant tightness noted.  Significant arthritic changes of the knees that does cause an antalgic gait noted.  Osteopathic findings  C2 flexed rotated and side bent right T9 extended rotated and side bent left L2 flexed rotated and side bent right L5 flexed rotated and side bent left Sacrum right on right       Assessment and Plan:  Left lumbar radiculopathy Responding well to muscle energy and FPR.  Will continue to monitor otherwise.  Patient has not done extremely well with different medications.  Holding on any other type of invasive procedures such as possible injections at the moment.  Increase activity slowly otherwise.  Follow-up with me again in 6 to 8 weeks.  [name] (endometrial intraepithelial neoplasia) Being followed and has a repeat biopsy in the next 2 to 3 months    Nonallopathic problems  Decision today to treat with OMT was based on Physical Exam  After verbal consent patient was treated with, ME, FPR techniques in cervical, thoracic, lumbar, and sacral  areas  Patient tolerated the procedure well with improvement in symptoms  Patient given exercises, stretches and lifestyle modifications  See medications in patient instructions if given  Patient will follow up in 4-8 weeks    The above documentation has been reviewed and is accurate and complete [name], DO          Note: This dictation was prepared with  Dragon dictation along with smaller phrase technology. Any transcriptional errors that result from this process are unintentional.

## 2023-08-08 ENCOUNTER — Ambulatory Visit: Admitting: Family Medicine

## 2023-08-08 ENCOUNTER — Encounter: Payer: Self-pay | Admitting: Family Medicine

## 2023-08-08 ENCOUNTER — Ambulatory Visit: Payer: Self-pay | Admitting: Gynecologic Oncology

## 2023-08-08 VITALS — BP 132/72 | HR 96 | Ht 60.0 in | Wt 233.0 lb

## 2023-08-08 DIAGNOSIS — M9901 Segmental and somatic dysfunction of cervical region: Secondary | ICD-10-CM

## 2023-08-08 DIAGNOSIS — M9904 Segmental and somatic dysfunction of sacral region: Secondary | ICD-10-CM | POA: Diagnosis not present

## 2023-08-08 DIAGNOSIS — M9903 Segmental and somatic dysfunction of lumbar region: Secondary | ICD-10-CM

## 2023-08-08 DIAGNOSIS — M5416 Radiculopathy, lumbar region: Secondary | ICD-10-CM

## 2023-08-08 DIAGNOSIS — N8502 Endometrial intraepithelial neoplasia [EIN]: Secondary | ICD-10-CM

## 2023-08-08 DIAGNOSIS — M9902 Segmental and somatic dysfunction of thoracic region: Secondary | ICD-10-CM | POA: Diagnosis not present

## 2023-08-08 LAB — SURGICAL PATHOLOGY

## 2023-08-08 NOTE — Assessment & Plan Note (Signed)
 Being followed and has a repeat biopsy in the next 2 to 3 months

## 2023-08-08 NOTE — Patient Instructions (Signed)
 One arm at a time w back against wall Continue SAM  See me in 6-8 weeks

## 2023-08-08 NOTE — Assessment & Plan Note (Signed)
 Responding well to muscle energy and FPR.  Will continue to monitor otherwise.  Patient has not done extremely well with different medications.  Holding on any other type of invasive procedures such as possible injections at the moment.  Increase activity slowly otherwise.  Follow-up with me again in 6 to 8 weeks.

## 2023-08-30 ENCOUNTER — Ambulatory Visit: Admitting: Internal Medicine

## 2023-09-03 NOTE — Progress Notes (Unsigned)
 Cardiology Clinic Note   Patient Name: Kerry [name] Date of Encounter: 09/05/2023  Primary Care Provider:  Street, Lonni HERO, MD Primary Cardiologist:  Vina Gull, MD  Patient Profile    Kerry [name] 73 year old female presents the clinic today for follow-up for of her coronary artery disease and preoperative cardiac evaluation.  Past Medical History    Past Medical History:  Diagnosis Date   Abnormal uterine bleeding    Allergic contact dermatitis due to metals    Allergic rhinitis, cause unspecified    Bursitis of left shoulder    Chronic pain syndrome    Complication of anesthesia    03-20-2023  pt stated has issues with memory post op   Coronary artery disease 01/2022   cardiologist--- dr myrtis gull;   positive PET NUC 01-24-2022;  cath 01-25-2022/  01-26-2022  multivessel CAD w/ severe global LV contractibility ef 55-60% LVEDP 30mm;    s/p  PTCA balloon mCX and OM2 and DES x1 to OM1   Dyspareunia    Endometrial mass    Family history of ovarian cancer    Full dentures    GERD (gastroesophageal reflux disease)    improved   History of adenomatous polyp of colon    History of cerebral infarction    neurologist--- dr marla. patel (lov note in epic 12-21-2021 for vertigo & HA)  MRI imaging incidental finding remote small vessal infarct right basal ganglia   History of esophageal stricture    s/p  EGD w/ dilatation 2010 and 03-12-2018   History of kidney stones    HTN (hypertension)    Hyperlipidemia    Interstitial lung disease (HCC)    followed by dr shellia ([facility] [place], [place])   long COVID related mild ILD with UIP pattern   Multiple pulmonary nodules    Neuromuscular disorder (HCC)    OA (osteoarthritis)    knees;  hands   OAB (overactive bladder)    OSA on CPAP    followed by pulmonology--- dr shellia;   HST in epic 03-04-2015  moderate osa   Polyarthralgia    Pre-diabetes    S/P drug eluting coronary stent placement 01/26/2022    PTCA balloon angioplasty and DES x1 toOM1   Somatic dysfunction    multiple sites;    lumbar & sacral regions;  ribs;   cervical spine   Stroke (HCC) 11/2022   dx TIA   Urinary incontinence    Uterine fibroid    Past Surgical History:  Procedure Laterality Date   CARPAL TUNNEL RELEASE Bilateral    left 05-21-2020;   right  1998   CATARACT EXTRACTION W/ INTRAOCULAR LENS IMPLANT Bilateral 2018   COLONOSCOPY WITH ESOPHAGOGASTRODUODENOSCOPY (EGD)  2023   [facility] [place]   COLONOSCOPY WITH ESOPHAGOGASTRODUODENOSCOPY (EGD) AND ESOPHAGEAL DILATION (ED)  03/12/2018   @ [facility]   CORONARY STENT INTERVENTION N/A 01/26/2022   Procedure: CORONARY STENT INTERVENTION;  Surgeon: Verlin Lonni BIRCH, MD;  Location: [facility];  Service: Cardiovascular;  Laterality: N/A;   DILATATION & CURETTAGE/HYSTEROSCOPY WITH MYOSURE N/A 08/22/2021   Procedure: DILATATION & CURETTAGE/HYSTEROSCOPY WITH MYOSURE RESECTION OF ENDOMETRIAL MASS;  Surgeon: Cathlyn JAYSON Nikki Bobie FORBES, MD;  Location[facility];  Service: Gynecology;  Laterality: N/A;   DILATATION & CURETTAGE/HYSTEROSCOPY WITH MYOSURE N/A 03/21/2023   Procedure: DILATATION & CURETTAGE/HYSTEROSCOPY WITH MYOSURE;  Surgeon: Dallie Vera GAILS, MD;  Location: [facility] OR;  Service: Gynecology;  Laterality: N/A;  [facility]  DILATION AND CURETTAGE OF UTERUS  2000   HAMMER TOE SURGERY Right 1990   HYSTEROSCOPY WITH D & C N/A 04/25/2023   Procedure: DILATATION AND CURETTAGE /HYSTEROSCOPY;  Surgeon: Viktoria Comer SAUNDERS, MD;  Location: [facility];  Service: Gynecology;  Laterality: N/A;  MYOSURE   INTRAUTERINE DEVICE (IUD) INSERTION N/A 04/25/2023   Procedure: INSERTION, INTRAUTERINE DEVICE;  Surgeon: Viktoria Comer SAUNDERS, MD;  Location: [facility];  Service: Gynecology;  Laterality: N/A;   LEFT HEART CATH AND CORONARY ANGIOGRAPHY N/A 01/25/2022   Procedure: LEFT HEART CATH AND CORONARY ANGIOGRAPHY;  Surgeon: Burnard Debby LABOR, MD;  Location: [facility]  INV[facility]IVE [facility];  Service: Cardiovascular;  Laterality: N/A;   NASAL SEPTUM SURGERY  2000   OPERATIVE ULTRASOUND N/A 08/22/2021   Procedure: OPERATIVE ULTRASOUND GUIDED HYSTEROSCOPY;  Surgeon: Cathlyn JAYSON Nikki Bobie FORBES, MD;  Location: [facility];  Service: Gynecology;  Laterality: N/A;   PELVIC LAPAROSCOPY  1990   remembers pelvic pain, normal findings   ROTATOR CUFF REPAIR Left 11/11/2013   @ [facility]  by  Dr. Dozier   SHOULDER ARTHROSCOPY W/ ROTATOR CUFF REPAIR Right 10/09/2011   @[facility]  by Dr. Dozier   UMBILICAL HERNIA REPAIR  2010   Patient reports mesh was used for the repair.    Allergies  Allergies  Allergen Reactions   Meloxicam  Other (See Comments)    Made patient have stomach pain ,sore throat, joint pains, face flush    Molds & Smuts Other (See Comments), Palpitations, Shortness Of Breath and Swelling   Cefixime Swelling   Celebrex [Celecoxib] Swelling   Cobalt Other (See Comments)    Per allergist   Gabapentin  Other (See Comments)    Joint pain   Levofloxacin Other (See Comments)    Muscle and joint pain   Molybdenum Other (See Comments)    Per allergist   Other Other (See Comments)    TEQUIN. TEQUIN - JOINT AND MUSCLE PAIN Other Reaction: painful joints Tantal Metal   Penicillins Hives   Robaxin [Methocarbamol]    Statins Other (See Comments)    Muscle pain   Tomato Hives   Adhesive [Tape] Rash    Sensitive to EKG leads; sensitive to 65M Micropore tape   Dust Mite Extract Itching, Palpitations and Other (See Comments)   Tetracycline Hcl Rash    Can take Doxy   Tetracyclines & Related Rash    History of Present Illness    Kerry [name] has a PMH of HTN, HLD, dyspnea, CAD with coronary calcium  score 12/23.  She was noted to have a score of 1051.  She was started on statin and aspirin .  Cardiac PET/CT stress test 1/24 showed multiple perfusion defects.  She underwent cardiac catheterization 1/24 which showed multivessel coronary  disease.  She underwent PCI with DES to her OM1 and PTCA to her circumflex and OM 2.  A platinum stent was used due to concern for stent allergy.  She was placed on dual antiplatelet therapy.  She was seen in follow-up by Jackee Alberts, NP 1/24.  She continued to note some shortness of breath post procedure.  She was seen again in follow-up 3/24.  At that time she complained of shortness of breath and fatigue.  She was seen again in follow-up by Dr. Okey Summer 24 and fall 24.  Statin therapy was stopped.  She felt better.  She was seen by clinical pharmacist and was working on being placed on Repatha .  She followed up with Dr. Okey 12/26/2022.  During that time she did note some dyspnea on exertion.  She denied chest pain, lower extremity swelling, and PND.  She presented to the clinic 03/14/23 for follow-up evaluation and preoperative cardiac evaluation.  She stated she was doing better than she was in December.  She did note that she had not returned to her baseline physical activity due to different health issues that have come up.  She was unable to do cardiac rehab due to the size of the machines and her stature.  She did do water  physical therapy for some time in [place] which she did well with.  Her EKG showed normal sinus rhythm 89 bpm.  Her blood pressure initially was 142/84 and on recheck was  134/80.  We reviewed her angiography.  She and her husband expressed understanding.  I  continued her medication therapy and kept follow-up as scheduled with Dr. Okey.  She presents to the clinic today for follow-up evaluation and states she is getting ready to resume water  physical therapy.  She notes that her breathing has been better since she has been on furosemide .  We again reviewed her cardiac catheterization.  She and her husband expressed understanding.  She reports that her blood pressures have been somewhat elevated at home.  She reports pressures in the 140s over 80s.  Today in clinic her blood  pressure initially is 158/96 and on recheck is 130/78.  We reviewed recommendations for monitoring blood pressure.  They expressed understanding.  I will refill her amlodipine , reduce her furosemide  to every third day and plan follow-up in 6 to 9 months.  Today she denies chest pain, shortness of breath, lower extremity edema, fatigue, palpitations, melena, hematuria, hemoptysis, diaphoresis, weakness, presyncope, syncope, orthopnea, and PND.      Home Medications    Prior to Admission medications   Medication Sig Start Date End Date Taking? Authorizing Provider  amLODipine  (NORVASC ) 5 MG tablet Take 1 tablet (5 mg total) by mouth daily. 07/19/22   Okey Vina GAILS, MD  ascorbic acid (VITAMIN C ) 250 MG tablet Take 250 mg by mouth daily.    [provider]  aspirin  EC 81 MG tablet Take 1 tablet (81 mg total) by mouth daily. Swallow whole. 01/03/22   Okey Vina GAILS, MD  carboxymethylcellulose (REFRESH PLUS) 0.5 % SOLN 1 drop 2 (two) times daily as needed (for dry eyes).    [provider]  CINNAMON PO Take 2.5 mLs by mouth daily. Patient not taking: Reported on 02/28/2023    [provider]  Coenzyme Q10 200 MG capsule Take 200 mg by mouth daily.    [provider]  cyanocobalamin  (VITAMIN B12) 1000 MCG tablet Take 1,000 mcg by mouth every Monday, Wednesday, and Friday.    [provider]  cyanocobalamin  (VITAMIN B12) 1000 MCG/ML injection Inject 1,000 mcg into the muscle once. Once a month    [provider]  Evolocumab  (REPATHA  SURECLICK) 140 MG/ML SOAJ Inject 140 mg into the skin every 14 (fourteen) days. 12/28/22   Okey Vina GAILS, MD  FIBER PO Take by mouth.    [provider]  furosemide  (LASIX ) 40 MG tablet Take 1 tablet (40 mg total) by mouth every other day. 12/26/22   Okey Vina GAILS, MD  metoprolol  succinate (TOPROL -XL) 25 MG 24 hr tablet Take 25 mg by mouth daily. 10/03/22   [provider]  MISC NATURAL PRODUCTS PO Take  by mouth. Black currant seed oil Patient not taking: Reported on 02/28/2023  [provider]  Multiple Vitamins-Minerals (SUPER MULTI-VITAMIN PO) Take 1 capsule by mouth daily. Solgar Formula VM-75    [provider]  nitroGLYCERIN  (NITROSTAT ) 0.4 MG SL tablet Place 1 tablet (0.4 mg total) under the tongue every 5 (five) minutes x 3 doses as needed for chest pain. 01/27/22   Vicci Rollo SAUNDERS, PA-C  NON FORMULARY Take 3 fluid ounces by mouth daily. Goat Kefir    [provider]  Omega-3 Fatty Acids (FISH OIL) 500 MG CAPS Take 500 mg by mouth daily.    [provider]  potassium chloride  SA (KLOR-CON  M) 20 MEQ tablet Take 1 tablet (20 mEq total) by mouth every other day. 12/26/22   Okey Vina GAILS, MD  Probiotic Product (PROBIOTIC ADVANCED PO) Take by mouth. Patient states taking 5 billion, one capsule daily. Suprema Dophilus probiotic    [provider]  Sodium Chloride -Xylitol (XLEAR SINUS CARE SPRAY NA) Place into the nose. 2 sprays at bedtime    [provider]  TART CHERRY PO Take 1 tablet by mouth daily.    [provider]  Turmeric (QC TUMERIC COMPLEX PO) Take by mouth.    [provider]  VITAMIN D -VITAMIN K PO Take 1 capsule by mouth every other day. Vit d 5000 units and 100mcg of K    [provider]    Family History    Family History  Problem Relation Age of Onset   Rheumatologic disease Mother    Diabetes Mother    Heart attack Father    Basal cell carcinoma Father    Rheumatologic disease Sister    Arthritis Sister        --Rheumatoid arthritis   Ovarian cancer Sister 56       hyst in 4s, surgery in 40s, no chemo, still alive   Crohn's disease Brother    Kidney failure Brother    Ulcerative colitis Brother    Glaucoma Brother    Cancer Niece 2       gyn   Breast cancer Neg Hx    Prostate cancer Neg Hx    Colon cancer Neg Hx    Pancreatic cancer Neg Hx    Endometrial cancer Neg Hx     She indicated that her mother is deceased. She indicated that her father is deceased. She indicated that her sister is alive. She indicated that only one of her two brothers is alive. She indicated that the status of her neg hx is unknown. She indicated that her niece is alive.  Social History    Social History   Socioeconomic History   Marital status: Married    Spouse name: Not on file   Number of children: Not on file   Years of education: Not on file   Highest education level: Not on file  Occupational History   Occupation: Retired  Tobacco Use   Smoking status: Never   Smokeless tobacco: Never  Vaping Use   Vaping status: Never Used  Substance and Sexual Activity   Alcohol  use: No    Alcohol /week: 0.0 standard drinks of alcohol    Drug use: Never   Sexual activity: Not Currently    Partners: Male    Birth control/protection: Post-menopausal  Other Topics Concern   Not on file  Social History Narrative   Are you right handed or left handed? Right handed   Are you currently employed ? No retired   What is your current occupation?   Do you live at  home alone? No   Who lives with you? Lives with husband.    What type of home do you live in: 1 story or 2 story? Two story home.       Social Drivers of Corporate investment banker Strain: Not on file  Food Insecurity: Low Risk  (06/27/2023)   Received from [facility]   Hunger Vital Sign    Within the past 12 months, you worried that your food would run out before you got money to buy more: Never true    Within the past 12 months, the food you bought just didn't last and you didn't have money to get more. : Never true  Transportation Needs: No Transportation Needs (06/27/2023)   Received from [facility]    In the past 12 months, has lack of reliable transportation kept you from medical appointments, meetings, work or from getting things needed for daily living? : No  Physical Activity: Not on file   Stress: Not on file  Social Connections: Not on file  Intimate Partner Violence: Not At Risk (01/25/2022)   Humiliation, Afraid, Rape, and Kick questionnaire    Fear of Current or Ex-Partner: No    Emotionally Abused: No    Physically Abused: No    Sexually Abused: No     Review of Systems    General:  No chills, fever, night sweats or weight changes.  Cardiovascular:  No chest pain, dyspnea on exertion, edema, orthopnea, palpitations, paroxysmal nocturnal dyspnea. Dermatological: No rash, lesions/masses Respiratory: No cough, dyspnea Urologic: No hematuria, dysuria Abdominal:   No nausea, vomiting, diarrhea, bright red blood per rectum, melena, or hematemesis Neurologic:  No visual changes, wkns, changes in mental status. All other systems reviewed and are otherwise negative except as noted above.  Physical Exam    VS:  BP 130/78   Pulse 92   Ht 5' (1.524 m)   Wt 230 lb 9.6 oz (104.6 kg)   LMP 08/16/1992   SpO2 96%   BMI 45.04 kg/m  , BMI Body mass index is 45.04 kg/m. GEN: Well nourished, well developed, in no acute distress. HEENT: normal. Neck: Supple, no JVD, carotid bruits, or masses. Cardiac: RRR, 2/6 systolic murmur heard along right sternal border. , rubs, or gallops. No clubbing, cyanosis, edema.  Radials/DP/PT 2+ and equal bilaterally.  Respiratory:  Respirations regular and unlabored, clear to auscultation bilaterally. GI: Soft, nontender, nondistended, BS + x 4. MS: no deformity or atrophy. Skin: warm and dry, no rash. Neuro:  Strength and sensation are intact. Psych: Normal affect.  Accessory Clinical Findings    Recent Labs: 11/23/2022: ALT 17 01/03/2023: NT-Pro BNP 160 04/20/2023: BUN 23; Creatinine, Ser 0.69; Hemoglobin 14.7; Platelets 213; Potassium 3.9; Sodium 140   Recent Lipid Panel    Component Value Date/Time   CHOL 134 03/12/2023 1049   TRIG 153 (H) 03/12/2023 1049   HDL 50 03/12/2023 1049   CHOLHDL 2.7 03/12/2023 1049   CHOLHDL 5.6  02/09/2011 1027   VLDL 27 02/09/2011 1027   LDLCALC 58 03/12/2023 1049         ECG personally reviewed by me today-   none today.  Echocardiogram 04/08/2021  IMPRESSIONS     1. Left ventricular ejection fraction, by estimation, is 60 to 65%. The  left ventricle has normal function. The left ventricle has no regional  wall motion abnormalities. There is moderate left ventricular hypertrophy.  Left ventricular diastolic  parameters are indeterminate.   2.  Right ventricular systolic function is normal. The right ventricular  size is normal. Tricuspid regurgitation signal is inadequate for assessing  PA pressure.   3. Left atrial size was mildly dilated.   4. No evidence of mitral valve regurgitation. Moderate mitral annular  calcification.   5. Aortic valve regurgitation is not visualized. Aortic valve  sclerosis/calcification is present, without any evidence of aortic  stenosis.   6. The inferior vena cava is normal in size with greater than 50%  respiratory variability, suggesting right atrial pressure of 3 mmHg.   Comparison(s): No significant change from prior study.   FINDINGS   Left Ventricle: Left ventricular ejection fraction, by estimation, is 60  to 65%. The left ventricle has normal function. The left ventricle has no  regional wall motion abnormalities. The left ventricular internal cavity  size was normal in size. There is   moderate left ventricular hypertrophy. Left ventricular diastolic  parameters are indeterminate.   Right Ventricle: The right ventricular size is normal. No increase in  right ventricular wall thickness. Right ventricular systolic function is  normal. Tricuspid regurgitation signal is inadequate for assessing PA  pressure.   Left Atrium: Left atrial size was mildly dilated.   Right Atrium: Right atrial size was normal in size.   Pericardium: There is no evidence of pericardial effusion.   Mitral Valve: Moderate mitral annular  calcification. No evidence of mitral  valve regurgitation.   Tricuspid Valve: The tricuspid valve is normal in structure. Tricuspid  valve regurgitation is not demonstrated.   Aortic Valve: Aortic valve regurgitation is not visualized. Aortic  regurgitation PHT measures 429 msec. Aortic valve sclerosis/calcification  is present, without any evidence of aortic stenosis. Aortic valve mean  gradient measures 11.0 mmHg. Aortic valve  peak gradient measures 20.9 mmHg. Aortic valve area, by VTI measures 1.46  cm.   Pulmonic Valve: The pulmonic valve was normal in structure. Pulmonic valve  regurgitation is not visualized.   Aorta: The aortic root and ascending aorta are structurally normal, with  no evidence of dilitation.   Venous: The inferior vena cava is normal in size with greater than 50%  respiratory variability, suggesting right atrial pressure of 3 mmHg.   IAS/Shunts: No atrial level shunt detected by color flow Doppler.    LHC 01/26/2022    Mid Cx lesion is 80% stenosed.   Prox Cx lesion is 20% stenosed.   2nd Diag lesion is 60% stenosed.   3rd Mrg lesion is 90% stenosed.   A drug-eluting stent was successfully placed using a SYNERGY XD 2.25X20.   Balloon angioplasty was performed using a BALLN SAPPHIRE 2.0X12.   Post intervention, there is a 0% residual stenosis.   Post intervention, there is a 60% residual stenosis.   Severe stenosis in the mid Circumflex affecting flow into the first obtuse marginal branch and the second obtuse marginal branch.  Successful PTCA/DES x 1 OM1 (Synergy stent platform as felt to be the most favorable given her metal allergy profile) Successful balloon angioplasty mid Circumflex/OM2   Recommendations: DAPT with ASA and Plavix  for at least six months.  Diagnostic Dominance: Right  Intervention         Assessment & Plan   1.  Coronary artery disease-continuesto be limited in her physical activity.  Able to do activities of daily  living.  Encouraged to increase physical activity as tolerated and to nonload bearing type activities.  Planning to resume water  physical therapy.  She denies exertional chest discomfort.  She  does note occasional indigestion.  With LHC with PCI and PTCA 1/24. Continue amlodipine , aspirin , Repatha , metoprolol , omega-3 fatty acids Heart healthy low-sodium diet  Hyperlipidemia-LDL 58 on 03/12/23.  Statin intolerant.  Reviewed recent lab work.  Significant improvement. High-fiber diet Increase physical activity as tolerated Continue aspirin , Repatha , omega-3 fatty acids, turmeric Plan to repeat fasting lipids and LFTs 2/26  Essential hypertension-BP today 130/78.  Will refill amlodipine  Maintain blood pressure log Continue amlodipine , metoprolol     Disposition: Follow-up with Dr. Okey or me in 6-9 months and as needed.   Josefa HERO. Jerre Vandrunen NP-C     09/05/2023, 9:59 AM [facility] [address] Office [phone] Fax [phone]    I spent 15 minutes examining this patient, reviewing medications, and using patient centered shared decision making involving their cardiac care.   I spent  20 minutes reviewing past medical history,  medications, and prior cardiac tests.

## 2023-09-05 ENCOUNTER — Encounter: Payer: Self-pay | Admitting: General Practice

## 2023-09-05 ENCOUNTER — Ambulatory Visit: Attending: Cardiology | Admitting: General Practice

## 2023-09-05 VITALS — BP 130/78 | HR 92 | Ht 60.0 in | Wt 230.6 lb

## 2023-09-05 DIAGNOSIS — I2510 Atherosclerotic heart disease of native coronary artery without angina pectoris: Secondary | ICD-10-CM | POA: Insufficient documentation

## 2023-09-05 DIAGNOSIS — E785 Hyperlipidemia, unspecified: Secondary | ICD-10-CM | POA: Diagnosis not present

## 2023-09-05 DIAGNOSIS — I10 Essential (primary) hypertension: Secondary | ICD-10-CM | POA: Insufficient documentation

## 2023-09-05 MED ORDER — AMLODIPINE BESYLATE 5 MG PO TABS: 5.0000 mg | ORAL_TABLET | Freq: Every day | ORAL | 3 refills | Status: DC

## 2023-09-05 MED ORDER — FUROSEMIDE 40 MG PO TABS: ORAL_TABLET | ORAL | 3 refills | Status: DC

## 2023-09-05 MED ORDER — METOPROLOL SUCCINATE ER 25 MG PO TB24: 25.0000 mg | ORAL_TABLET | Freq: Every day | ORAL | 3 refills | Status: DC

## 2023-09-05 NOTE — Patient Instructions (Signed)
 Medication Instructions:  Your physician has recommended you make the following change in your medication:  DECREASE LASIX  TO TAKING EVERY 3RD DAY.  Please make sure to weigh daily and if your weight increases 3 lbs or more in 1 day or 5 lbs in 1 week you may take an extra lasix   *If you need a refill on your cardiac medications before your next appointment, please call your pharmacy*  Lab Work: NONE If you have labs (blood work) drawn today and your tests are completely normal, you will receive your results only by: MyChart Message (if you have MyChart) OR A paper copy in the mail If you have any lab test that is abnormal or we need to change your treatment, we will call you to review the results.  Testing/Procedures: NONE  Follow-Up: At [facility], you and your health needs are our priority.  As part of our continuing mission to provide you with exceptional heart care, our providers are all part of one team.  This team includes your primary Cardiologist (physician) and Advanced Practice Providers or APPs (Physician Assistants and Nurse Practitioners) who all work together to provide you with the care you need, when you need it.  Your next appointment:   6-9 month(s)  Provider:   Vina Gull, MD or Noemi Beauvais, PA-C

## 2023-09-07 ENCOUNTER — Ambulatory Visit: Admitting: Obstetrics and Gynecology

## 2023-09-07 ENCOUNTER — Encounter: Payer: Self-pay | Admitting: Obstetrics and Gynecology

## 2023-09-07 VITALS — BP 154/76 | HR 83

## 2023-09-07 DIAGNOSIS — N3281 Overactive bladder: Secondary | ICD-10-CM | POA: Diagnosis not present

## 2023-09-07 DIAGNOSIS — R350 Frequency of micturition: Secondary | ICD-10-CM

## 2023-09-07 NOTE — Progress Notes (Signed)
 [facility]  PTNS VISIT  CC:  Overactive bladder  73 y.o. with refractory overactive bladder who presents for percutaneous tibial nerve stimulation. The patient presents for PTNS Maintenance session # 6.  Patient reports she continues to have good control with her urgency and frequency doing her PTNS maintenance every 6 weeks. She states she is also being decreased on her lasix  dose which she hopes will help even more.   Procedure: The patient was placed in the sitting position and the left lower extremity was prepped in the usual fashion. The PTNS needle was then inserted at a 60 degree angle, 5 cm cephalad and 2 cm posterior to the medial malleolus. The PTNS unit was then programmed and an optimal response was noted at 9 milliamps. The PTNS stimulation was then performed at this setting for 30 minutes without incident and the patient tolerated the procedure well. The needle was removed and hemostasis was noted.   The pt will return in 1 week for PTNS session # 7. All questions were answered.

## 2023-09-25 NOTE — Progress Notes (Unsigned)
 [name] [address] [place] [address] Phone: [phone] Subjective:   Kerry [name] am a scribe for Dr. Claudene.   I'm seeing this patient by the request  of:  Street, Lonni HERO, MD  CC:   [name]  Kerry [name] is a 73 y.o. female coming in with complaint of back and neck pain. OMT on 08/08/2023. Patient states that her knee and her right side of her body is in pain today. Second to the last toe on her right foot is raising up on its own. Last week had a lot of muscle tension and could not get out of the chair.   Medications patient has been prescribed:   Taking:         Reviewed prior external information including notes and imaging from previsou exam, outside providers and external EMR if available.   As well as notes that were available from care everywhere and other healthcare systems.  Past medical history, social, surgical and family history all reviewed in electronic medical record.  No pertanent information unless stated regarding to the chief complaint.   Past Medical History:  Diagnosis Date   Abnormal uterine bleeding    Allergic contact dermatitis due to metals    Allergic rhinitis, cause unspecified    Bursitis of left shoulder    Chronic pain syndrome    Complication of anesthesia    03-20-2023  pt stated has issues with memory post op   Coronary artery disease 01/2022   cardiologist--- dr myrtis gull;   positive PET NUC 01-24-2022;  cath 01-25-2022/  01-26-2022  multivessel CAD w/ severe global LV contractibility ef 55-60% LVEDP 30mm;    s/p  PTCA balloon mCX and OM2 and DES x1 to OM1   Dyspareunia    Endometrial mass    Family history of ovarian cancer    Full dentures    GERD (gastroesophageal reflux disease)    improved   History of adenomatous polyp of colon    History of cerebral infarction    neurologist--- dr marla. patel (lov note in epic 12-21-2021 for vertigo & HA)  MRI imaging  incidental finding remote small vessal infarct right basal ganglia   History of esophageal stricture    s/p  EGD w/ dilatation 2010 and 03-12-2018   History of kidney stones    HTN (hypertension)    Hyperlipidemia    Interstitial lung disease (HCC)    followed by dr shellia ([facility] [place], [place])   long COVID related mild ILD with UIP pattern   Multiple pulmonary nodules    Neuromuscular disorder (HCC)    OA (osteoarthritis)    knees;  hands   OAB (overactive bladder)    OSA on CPAP    followed by pulmonology--- dr shellia;   HST in epic 03-04-2015  moderate osa   Polyarthralgia    Pre-diabetes    S/P drug eluting coronary stent placement 01/26/2022   PTCA balloon angioplasty and DES x1 toOM1   Somatic dysfunction    multiple sites;    lumbar & sacral regions;  ribs;   cervical spine   Stroke (HCC) 11/2022   dx TIA   Urinary incontinence    Uterine fibroid     Allergies  Allergen Reactions   Meloxicam  Other (See Comments)    Made patient have stomach pain ,sore throat, joint pains, face flush    Molds & Smuts Other (See Comments), Palpitations, Shortness Of Breath and Swelling  Cefixime Swelling   Celebrex [Celecoxib] Swelling   Cobalt Other (See Comments)    Per allergist   Gabapentin  Other (See Comments)    Joint pain   Levofloxacin Other (See Comments)    Muscle and joint pain   Molybdenum Other (See Comments)    Per allergist   Other Other (See Comments)    TEQUIN. TEQUIN - JOINT AND MUSCLE PAIN Other Reaction: painful joints Tantal Metal   Penicillins Hives   Robaxin [Methocarbamol]    Statins Other (See Comments)    Muscle pain   Tomato Hives   Adhesive [Tape] Rash    Sensitive to EKG leads; sensitive to 91M Micropore tape   Dust Mite Extract Itching, Palpitations and Other (See Comments)   Tetracycline Hcl Rash    Can take Doxy   Tetracyclines & Related Rash     Review of Systems:  No headache, visual changes, nausea, vomiting, diarrhea,  constipation, dizziness, abdominal pain, skin rash, fevers, chills, night sweats, weight loss, swollen lymph nodes, body aches, joint swelling, chest pain, shortness of breath, mood changes. POSITIVE muscle aches  Objective  Blood pressure (!) 142/80, pulse (!) 101, height 5' (1.524 m), weight 234 lb (106.1 kg), last menstrual period 08/16/1992, SpO2 96%.   General: Patient is very uncomfortable and tearful today. HEENT: Pupils equal, extraocular movements intact  Respiratory: Patient's speak in full sentences and does not appear short of breath  Cardiovascular: No lower extremity edema, non tender, no erythema           Assessment and Plan:  Low back pain Low back pain is severe.  Patient does have severe positive CVA tenderness on the right side.  Very concerned that patient either has a urinary tract infection versus the possibility of a kidney stone.  Has had a history of kidney stones previously.  Patient has had either hematuria or is having breakthrough bleeding while they are watching the precancerous cells of her uterus.  At this point we discussed different treatment options for the patient.  Given a Toradol  injection today, laboratory workup, getting KUB and is scheduled for a CT renal protocol for tomorrow.  We discussed that if worsening symptoms that she needs to seek medical attention immediately and if any of the labs come back significantly concerning.  Do need to consider the possibility also of MRI to further evaluate the uterus but at this moment do not think that is potentially contributing at this time.  Follow-up with me again 6 weeks otherwise.  Patient is accompanied with husband who has worked in medicine as well and understands which symptoms to look out for.  Arthritis of knee, degenerative Severe bone-on-bone arthritic changes of the right knee noted.  Last injection was a year and a half ago.  At this point patient is in severe pain and barely able to bear weight.   Has not lost enough weight to be a surgical candidate.  BMI is over 45.  At this point given injection to see how patient responds.  Hopefully this will help her with ambulation but once again more concern for urinary tract infection or kidney stone.       The above documentation has been reviewed and is accurate and complete Kerry Erich M Kaegan Hettich, DO          Note: This dictation was prepared with Dragon dictation along with smaller phrase technology. Any transcriptional errors that result from this process are unintentional.

## 2023-09-26 ENCOUNTER — Ambulatory Visit: Admitting: Family Medicine

## 2023-09-26 ENCOUNTER — Emergency Department (HOSPITAL_COMMUNITY)

## 2023-09-26 ENCOUNTER — Encounter: Payer: Self-pay | Admitting: Family Medicine

## 2023-09-26 ENCOUNTER — Emergency Department (HOSPITAL_COMMUNITY)
Admission: EM | Admit: 2023-09-26 | Discharge: 2023-09-27 | Disposition: A | Discharge status: Home / Self Care | Attending: Emergency Medicine | Admitting: Emergency Medicine

## 2023-09-26 ENCOUNTER — Encounter (HOSPITAL_COMMUNITY): Payer: Self-pay

## 2023-09-26 ENCOUNTER — Ambulatory Visit (INDEPENDENT_AMBULATORY_CARE_PROVIDER_SITE_OTHER)

## 2023-09-26 ENCOUNTER — Other Ambulatory Visit: Payer: Self-pay

## 2023-09-26 VITALS — BP 142/80 | HR 101 | Ht 60.0 in | Wt 234.0 lb

## 2023-09-26 DIAGNOSIS — I10 Essential (primary) hypertension: Secondary | ICD-10-CM | POA: Insufficient documentation

## 2023-09-26 DIAGNOSIS — Z79899 Other long term (current) drug therapy: Secondary | ICD-10-CM | POA: Insufficient documentation

## 2023-09-26 DIAGNOSIS — E611 Iron deficiency: Secondary | ICD-10-CM | POA: Diagnosis not present

## 2023-09-26 DIAGNOSIS — M1711 Unilateral primary osteoarthritis, right knee: Secondary | ICD-10-CM

## 2023-09-26 DIAGNOSIS — Z7982 Long term (current) use of aspirin: Secondary | ICD-10-CM | POA: Insufficient documentation

## 2023-09-26 DIAGNOSIS — R109 Unspecified abdominal pain: Secondary | ICD-10-CM | POA: Insufficient documentation

## 2023-09-26 DIAGNOSIS — R1084 Generalized abdominal pain: Secondary | ICD-10-CM | POA: Diagnosis not present

## 2023-09-26 DIAGNOSIS — M545 Low back pain, unspecified: Secondary | ICD-10-CM

## 2023-09-26 DIAGNOSIS — G8929 Other chronic pain: Secondary | ICD-10-CM | POA: Diagnosis not present

## 2023-09-26 DIAGNOSIS — R5383 Other fatigue: Secondary | ICD-10-CM

## 2023-09-26 LAB — COMPREHENSIVE METABOLIC PANEL WITH GFR
ALT: 15 U/L (ref 0–35)
AST: 15 U/L (ref 0–37)
Albumin: 4.5 g/dL (ref 3.5–5.2)
Alkaline Phosphatase: 45 U/L (ref 39–117)
BUN: 21 mg/dL (ref 6–23)
CO2: 28 meq/L (ref 19–32)
Calcium: 9.2 mg/dL (ref 8.4–10.5)
Chloride: 105 meq/L (ref 96–112)
Creatinine, Ser: 0.81 mg/dL (ref 0.40–1.20)
GFR: 71.25 mL/min (ref >60.00)
Glucose, Bld: 110 mg/dL — ABNORMAL HIGH (ref 70–99)
Potassium: 4.1 meq/L (ref 3.5–5.1)
Sodium: 140 meq/L (ref 135–145)
Total Bilirubin: 0.3 mg/dL (ref 0.2–1.2)
Total Protein: 7.4 g/dL (ref 6.0–8.3)

## 2023-09-26 LAB — URINALYSIS, ROUTINE W REFLEX MICROSCOPIC
Bacteria, UA: NONE SEEN
Bilirubin Urine: NEGATIVE
Bilirubin Urine: NEGATIVE
Glucose, UA: NEGATIVE mg/dL
Ketones, ur: NEGATIVE
Ketones, ur: NEGATIVE mg/dL
Leukocytes,Ua: NEGATIVE
Nitrite: NEGATIVE
Nitrite: NEGATIVE
Protein, ur: NEGATIVE mg/dL
Specific Gravity, Urine: 1.015 (ref 1.000–1.030)
Specific Gravity, Urine: 1.018 (ref 1.005–1.030)
Total Protein, Urine: NEGATIVE
Urine Glucose: NEGATIVE
Urobilinogen, UA: 0.2 (ref 0.0–1.0)
pH: 7 (ref 5.0–8.0)
pH: 6 (ref 5.0–8.0)

## 2023-09-26 LAB — URIC ACID: Uric Acid, Serum: 7.1 mg/dL — ABNORMAL HIGH (ref 2.4–7.0)

## 2023-09-26 LAB — CBC WITH DIFFERENTIAL/PLATELET
Basophils Absolute: 0 K/uL (ref 0.0–0.1)
Basophils Relative: 0.3 % (ref 0.0–3.0)
Eosinophils Absolute: 0.3 K/uL (ref 0.0–0.7)
Eosinophils Relative: 4 % (ref 0.0–5.0)
HCT: 45.3 % (ref 36.0–46.0)
Hemoglobin: 15.2 g/dL — ABNORMAL HIGH (ref 12.0–15.0)
Lymphocytes Relative: 23.2 % (ref 12.0–46.0)
Lymphs Abs: 1.8 K/uL (ref 0.7–4.0)
MCHC: 33.5 g/dL (ref 30.0–36.0)
MCV: 88.2 fl (ref 78.0–100.0)
Monocytes Absolute: 0.7 K/uL (ref 0.1–1.0)
Monocytes Relative: 8.9 % (ref 3.0–12.0)
Neutro Abs: 4.8 K/uL (ref 1.4–7.7)
Neutrophils Relative %: 63.6 % (ref 43.0–77.0)
Platelets: 229 K/uL (ref 150.0–400.0)
RBC: 5.13 Mil/uL — ABNORMAL HIGH (ref 3.87–5.11)
RDW: 14.3 % (ref 11.5–15.5)
WBC: 7.6 K/uL (ref 4.0–10.5)

## 2023-09-26 LAB — IBC PANEL
Iron: 65 ug/dL (ref 42–145)
Saturation Ratios: 17.1 % — ABNORMAL LOW (ref 20.0–50.0)
TIBC: 380.8 ug/dL (ref 250.0–450.0)
Transferrin: 272 mg/dL (ref 212.0–360.0)

## 2023-09-26 LAB — FERRITIN: Ferritin: 69.3 ng/mL (ref 10.0–291.0)

## 2023-09-26 LAB — SEDIMENTATION RATE: Sed Rate: 22 mm/h (ref 0–30)

## 2023-09-26 MED ORDER — KETOROLAC TROMETHAMINE 60 MG/2ML IM SOLN
60.0000 mg | Freq: Once | INTRAMUSCULAR | Status: AC
  Administered 2023-09-26: 60 mg via INTRAMUSCULAR

## 2023-09-26 NOTE — ED Provider Triage Note (Cosign Needed Addendum)
 Emergency Medicine Provider Triage Evaluation Note  Kerry [name] , a 73 y.o. female  was evaluated in triage.  Pt complains of flank pain and abdominal pain.  Patient was seen by her sports medicine provider today and discussed her flank pain in depth with him, he obtained basic labs as well as a urinalysis and an upright abdominal x-ray, she was told that he suspected she likely had a kidney stone and recommended that they proceed to the emergency department for further evaluation.  Describes her pain as ongoing for several weeks, because it an intense ache but is sharp at times.  No nausea/vomiting.  Review of Systems  Positive: As above Negative: As above  Physical Exam  BP (!) 173/69 (BP Location: Left Arm)   Pulse 85   Temp 98.5 F (36.9 C) (Oral)   Resp 19   Ht 5' (1.524 m)   Wt 106.6 kg   LMP 08/16/1992   SpO2 95%   BMI 45.90 kg/m  Gen:   Awake, no distress   Resp:  Normal effort  MSK:   Moves extremities without difficulty  Other:  Abdomen is soft and nontender to palpation.  There is mild right sided CVA tenderness.  Medical Decision Making  Medically screening exam initiated at 5:39 PM.  Appropriate orders placed.  Kerry [name] was informed that the remainder of the evaluation will be completed by another provider, this initial triage assessment does not replace that evaluation, and the importance of remaining in the ED until their evaluation is complete.  Patient had CBC, CMP, iron panel, ESR, and urinalysis performed at her sports medicine provider's office this morning, I did not order additional labs.      Kerry [name], [place] 09/26/23 1742

## 2023-09-26 NOTE — ED Triage Notes (Signed)
 Pt arrives via POV. Pt c/o right flank pain and nausea for the past couple of weeks. PT was told she had a kidney stone. Pt arrives AxOx4.

## 2023-09-26 NOTE — Assessment & Plan Note (Signed)
 Severe bone-on-bone arthritic changes of the right knee noted.  Last injection was a year and a half ago.  At this point patient is in severe pain and barely able to bear weight.  Has not lost enough weight to be a surgical candidate.  BMI is over 45.  At this point given injection to see how patient responds.  Hopefully this will help her with ambulation but once again more concern for urinary tract infection or kidney stone.

## 2023-09-26 NOTE — Assessment & Plan Note (Signed)
 Low back pain is severe.  Patient does have severe positive CVA tenderness on the right side.  Very concerned that patient either has a urinary tract infection versus the possibility of a kidney stone.  Has had a history of kidney stones previously.  Patient has had either hematuria or is having breakthrough bleeding while they are watching the precancerous cells of her uterus.  At this point we discussed different treatment options for the patient.  Given a Toradol  injection today, laboratory workup, getting KUB and is scheduled for a CT renal protocol for tomorrow.  We discussed that if worsening symptoms that she needs to seek medical attention immediately and if any of the labs come back significantly concerning.  Do need to consider the possibility also of MRI to further evaluate the uterus but at this moment do not think that is potentially contributing at this time.  Follow-up with me again 6 weeks otherwise.  Patient is accompanied with husband who has worked in medicine as well and understands which symptoms to look out for.

## 2023-09-26 NOTE — Patient Instructions (Addendum)
 Good to see you. Xray today CT renal protocol [facility] ground floor 4:00pm 09/27/2023 Labs See me again in 5-6 week f/u

## 2023-09-27 ENCOUNTER — Ambulatory Visit (HOSPITAL_BASED_OUTPATIENT_CLINIC_OR_DEPARTMENT_OTHER)

## 2023-09-27 LAB — URINE CULTURE
MICRO NUMBER:: 16949132
SPECIMEN QUALITY:: ADEQUATE

## 2023-09-27 NOTE — ED Provider Notes (Signed)
 [place] EMERGENCY DEPARTMENT AT [facility] Provider Note   CSN: [id number] Arrival date & time: 09/26/23  1640     Patient presents with: Flank Pain   Kerry [name] is a 73 y.o. female.   The history is provided by the patient and medical records.  Flank Pain   73 year old female with history of chronic pain, GERD, hypertension, prior stroke, stress incontinence, presenting to the ED for right flank pain.  This has been ongoing for the past month or so.  States she saw her sports medicine doctor today who gave her an injection which seemed to help a lot with her discomfort.  States she was told there may be a kidney stone so was sent to the ER for further evaluation.  She states she was taking a bath prior to coming to the ER and while submerged in the water  she felt a pop in her back which also seemed to improve her symptoms a lot.  Does admit that she has had a lot of problems with her knee so has been walking slightly off balance recently.  She denies any new numbness or weakness of her legs.  No frank incontinence.  Prior to Admission medications   Medication Sig Start Date End Date Taking? Authorizing Provider  amLODipine  (NORVASC ) 5 MG tablet Take 1 tablet (5 mg total) by mouth daily. 09/05/23   Emelia Josefa HERO, NP  ascorbic acid (VITAMIN C ) 500 MG tablet Take 250 mg by mouth daily.    [provider]  aspirin  EC 81 MG tablet Take 1 tablet (81 mg total) by mouth daily. Swallow whole. 01/03/22   Okey Vina GAILS, MD  BLACK CURRANT SEED OIL PO Take by mouth.    [provider]  carboxymethylcellulose (REFRESH PLUS) 0.5 % SOLN Place 1 drop into both eyes 2 (two) times daily as needed (for dry eyes).    [provider]  Coenzyme Q10 200 MG capsule Take 200 mg by mouth daily after lunch.    [provider]  cyanocobalamin  (VITAMIN B12) 1000 MCG tablet Take 1,000 mcg by mouth every Monday, Wednesday, and Friday.    [provider]  cyanocobalamin  (VITAMIN B12) 1000 MCG/ML injection Inject 1,000 mcg into the muscle every 30 (thirty) days.    [provider]  Evolocumab  (REPATHA  SURECLICK) 140 MG/ML SOAJ Inject 140 mg into the skin every 14 (fourteen) days. 12/28/22   Okey Vina GAILS, MD  FIBER ADULT GUMMIES PO Take 2-3 each by mouth 2 (two) times daily as needed (constipation).    [provider]  furosemide  (LASIX ) 40 MG tablet Take 40 mg by mouth every 3rd day 09/05/23   Emelia Josefa HERO, NP  metoprolol  succinate (TOPROL -XL) 25 MG 24 hr tablet Take 1 tablet (25 mg total) by mouth daily after lunch. 09/05/23   Emelia Josefa HERO, NP  Multiple Vitamins-Minerals (SUPER MULTI-VITAMIN PO) Take 1 capsule by mouth daily. Solgar Formula VM-75    [provider]  nitroGLYCERIN  (NITROSTAT ) 0.4 MG SL tablet Place 1 tablet (0.4 mg total) under the tongue every 5 (five) minutes x 3 doses as needed for chest pain. 01/27/22   Vicci Rollo SAUNDERS, PA-C  Omega-3 Fatty Acids (FISH OIL) 500 MG CAPS Take 500 mg by mouth daily. Taking 1,00mg  every other day    [provider]  potassium chloride  SA (KLOR-CON  M) 20 MEQ tablet Take 1 tablet (20 mEq total) by mouth every other day. 12/26/22   Okey Vina GAILS,  MD  Probiotic Product (PROBIOTIC ADVANCED PO) Take 1 capsule by mouth daily.  Suprema Dophilus probiotic    [provider]  Sodium Chloride -Xylitol (XLEAR SINUS CARE SPRAY) SOLN Place 2 sprays into the nose at bedtime. Rinse only    [provider]  TART CHERRY PO Take 1 tablet by mouth daily. Chewable    [provider]  triamcinolone  cream (KENALOG) 0.1 % Apply 1 Application topically 2 (two) times daily as needed (eczema).    [provider]  TURMERIC-GINGER PO Take 1 capsule by mouth 2 (two) times daily.    [provider]  VITAMIN D -VITAMIN K PO Take 1 capsule by mouth every other day. Vit d 5000 units and 100mcg of K    [provider]     Allergies: Meloxicam , Molds & smuts, Cefixime, Celebrex [celecoxib], Cobalt, Gabapentin , Levofloxacin, Molybdenum, Other, Penicillins, Robaxin [methocarbamol], Statins, Tomato, Adhesive [tape], Dust mite extract, Tetracycline hcl, and Tetracyclines & related    Review of Systems  Genitourinary:  Positive for flank pain.  All other systems reviewed and are negative.   Updated Vital Signs BP (!) 149/74 (BP Location: Right Arm)   Pulse 69   Temp 98.5 F (36.9 C) (Oral)   Resp 20   Ht 5' (1.524 m)   Wt 106.6 kg   LMP 08/16/1992   SpO2 97%   BMI 45.90 kg/m   Physical Exam Vitals and nursing note reviewed.  Constitutional:      Appearance: She is well-developed.  HENT:     Head: Normocephalic and atraumatic.  Eyes:     Conjunctiva/sclera: Conjunctivae normal.     Pupils: Pupils are equal, round, and reactive to light.  Cardiovascular:     Rate and Rhythm: Normal rate and regular rhythm.     Heart sounds: Normal heart sounds.  Pulmonary:     Effort: Pulmonary effort is normal.     Breath sounds: Normal breath sounds.  Abdominal:     General: Bowel sounds are normal.     Palpations: Abdomen is soft.     Tenderness: There is no abdominal tenderness. There is no rebound.     Comments: No rash noted to abdomen or flank, nontender on palpation  Musculoskeletal:        General: Normal range of motion.     Cervical back: Normal range of motion.     Comments: Moving legs easily on exam, remains ambulatory  Skin:    General: Skin is warm and dry.  Neurological:     Mental Status: She is alert and oriented to person, place, and time.     (all labs ordered are listed, but only abnormal results are displayed) Labs Reviewed  URINALYSIS, ROUTINE W REFLEX MICROSCOPIC - Abnormal; Notable for the following components:      Result Value   Hgb urine dipstick SMALL (*)    All other components within normal limits    EKG: None  Radiology: CT Renal Stone Study Result Date:  09/26/2023 CLINICAL DATA:  Right flank pain EXAM: CT ABDOMEN AND PELVIS WITHOUT CONTRAST TECHNIQUE: Multidetector CT imaging of the abdomen and pelvis was performed following the standard protocol without IV contrast. RADIATION DOSE REDUCTION: This exam was performed according to the departmental dose-optimization program which includes automated exposure control, adjustment of the mA and/or kV according to patient size and/or use of iterative reconstruction technique. COMPARISON:  04/11/2013 FINDINGS: Lower chest: No acute findings. Calcifications in the visualized right coronary artery. Aortic atherosclerosis. Hepatobiliary: No focal  hepatic abnormality. Gallbladder unremarkable. Pancreas: No focal abnormality or ductal dilatation. Spleen: No focal abnormality.  Normal size. Adrenals/Urinary Tract: Adrenal glands normal. No renal or ureteral stones. No hydronephrosis. 1.6 cm low-density area in the midpole of the right kidney. Exophytic 1.2 cm low-density lesion off the midpole of the left kidney. These likely reflect small cyst. No follow-up imaging recommended. Urinary bladder unremarkable. Stomach/Bowel: Stomach, large and small bowel grossly unremarkable. Vascular/Lymphatic: Aortic atherosclerosis. No evidence of aneurysm or adenopathy. Reproductive: Uterus and adnexa unremarkable. No mass. IUD in the uterus. Other: No free fluid or free air. Musculoskeletal: No acute bony abnormality. IMPRESSION: No renal or ureteral stones.  No hydronephrosis. No acute findings in the abdomen or pelvis. Coronary artery disease, aortic atherosclerosis. Electronically Signed   By: Franky Crease M.D.   On: 09/26/2023 19:04   DG Abd 1 View Result Date: 09/26/2023 CLINICAL DATA:  abdominal pain EXAM: ABDOMEN - 1 VIEW COMPARISON:  02/10/2023 FINDINGS: Nonobstructive bowel gas pattern.Small volume fecal loading in the transverse colon.No pneumoperitoneum. No organomegaly or radiopaque calculi. No acute fracture or destructive  lesion. Multilevel degenerative disc disease of the spine.Intrauterine device within the pelvis. IMPRESSION: Nonobstructive bowel gas pattern. Electronically Signed   By: Rogelia Myers M.D.   On: 09/26/2023 17:02     Procedures   Medications Ordered in the ED - No data to display                                  Medical Decision Making Amount and/or Complexity of Data Reviewed Radiology: ordered and independent interpretation performed. ECG/medicine tests: ordered and independent interpretation performed.   73 year old female presenting to the ED with 1 month of flank pain.  Seen by sports medicine doctor today and sent in with concern of kidney stone.  Does admit to some issues with her knee so has been walking slightly off-center to offset some of her weight.  She was given an injection in the office and is currently pain-free.  She is afebrile and nontoxic in appearance here.  She does not have any reproducible tenderness on exam.  No rash suggestive of shingles.  UA with hematuria, this has been an ongoing issue and she is currently following with urogynecology for this.  CT today without any acute findings.  She does admit to improvement after injection in office and also had a pop in her back while in the tub today.  I suspect her symptoms are likely musculoskeletal in nature.  She does not have any focal neurologic deficits or red flag symptoms currently.  I feel she is stable for discharge to follow-up with her PCP.  Also has upcoming appointment with her urogynecologist.  Can return here for new concerns.  Final diagnoses:  Flank pain    ED Discharge Orders     None          Jarold Olam CHRISTELLA DEVONNA 09/27/23 GARLON Midge Golas, MD 09/27/23 [phone]

## 2023-09-27 NOTE — Discharge Instructions (Signed)
 No kidney stone or urine infection found today.  Your labs from PCP visit earlier today were also normal.  We suspect this may be musculoskeletal. Can continue to follow-up closely with Dr. Ritta him know about results from ER visit. Return here for new concerns.

## 2023-10-10 MED ORDER — POTASSIUM CHLORIDE CRYS ER 20 MEQ PO TBCR: EXTENDED_RELEASE_TABLET | ORAL | 3 refills | Status: DC

## 2023-10-15 DIAGNOSIS — Z23 Encounter for immunization: Secondary | ICD-10-CM | POA: Diagnosis not present

## 2023-10-15 DIAGNOSIS — N8502 Endometrial intraepithelial neoplasia [EIN]: Secondary | ICD-10-CM | POA: Diagnosis not present

## 2023-10-15 DIAGNOSIS — I130 Hypertensive heart and chronic kidney disease with heart failure and stage 1 through stage 4 chronic kidney disease, or unspecified chronic kidney disease: Secondary | ICD-10-CM | POA: Diagnosis not present

## 2023-10-15 DIAGNOSIS — I5032 Chronic diastolic (congestive) heart failure: Secondary | ICD-10-CM | POA: Diagnosis not present

## 2023-10-15 DIAGNOSIS — M170 Bilateral primary osteoarthritis of knee: Secondary | ICD-10-CM | POA: Diagnosis not present

## 2023-10-17 ENCOUNTER — Encounter: Payer: Self-pay | Admitting: Obstetrics and Gynecology

## 2023-10-18 ENCOUNTER — Ambulatory Visit (INDEPENDENT_AMBULATORY_CARE_PROVIDER_SITE_OTHER): Admitting: Obstetrics and Gynecology

## 2023-10-18 ENCOUNTER — Encounter: Payer: Self-pay | Admitting: Obstetrics and Gynecology

## 2023-10-18 VITALS — BP 177/72 | HR 81

## 2023-10-18 DIAGNOSIS — N3945 Continuous leakage: Secondary | ICD-10-CM

## 2023-10-18 DIAGNOSIS — R350 Frequency of micturition: Secondary | ICD-10-CM

## 2023-10-18 DIAGNOSIS — N3281 Overactive bladder: Secondary | ICD-10-CM | POA: Diagnosis not present

## 2023-10-18 LAB — POCT URINALYSIS DIP (CLINITEK)
Bilirubin, UA: NEGATIVE
Blood, UA: NEGATIVE
Glucose, UA: NEGATIVE mg/dL
Ketones, POC UA: NEGATIVE mg/dL
Leukocytes, UA: NEGATIVE
Nitrite, UA: NEGATIVE
POC PROTEIN,UA: NEGATIVE
Spec Grav, UA: 1.01 (ref 1.010–1.025)
Urobilinogen, UA: 0.2 U/dL
pH, UA: 6 (ref 5.0–8.0)

## 2023-10-18 MED ORDER — VIBEGRON 75 MG PO TABS: 75.0000 mg | ORAL_TABLET | Freq: Every day | ORAL | 5 refills | Status: DC

## 2023-10-18 NOTE — Progress Notes (Signed)
 [facility] Return Visit  SUBJECTIVE  History of Present Illness: Kerry [name] is a 73 y.o. female seen in follow-up for OAB/UUI. Patient has had significant leakage and discomfort. She reports she has been having back and knee problems and then has had a period of significant loss of urine and now has leaking every time she stands up. She had an X-ray that shows the degenerative disc disease in her back and was seen in the ER for flank pain where no Kidney stone was seen and no sign of a UTI.   Patient reports significant impact to her quality of life as she was about to go on a trip to see her family up [place] and now does not feel able to go.    Past Medical History: Patient  has a past medical history of Abnormal uterine bleeding, Allergic contact dermatitis due to metals, Allergic rhinitis, cause unspecified, Bursitis of left shoulder, Chronic pain syndrome, Complication of anesthesia, Coronary artery disease (01/2022), Dyspareunia, Endometrial mass, Family history of ovarian cancer, Full dentures, GERD (gastroesophageal reflux disease), History of adenomatous polyp of colon, History of cerebral infarction, History of esophageal stricture, History of kidney stones, HTN (hypertension), Hyperlipidemia, Interstitial lung disease (HCC), Multiple pulmonary nodules, Neuromuscular disorder (HCC), OA (osteoarthritis), OAB (overactive bladder), OSA on CPAP, Polyarthralgia, Pre-diabetes, S/P drug eluting coronary stent placement (01/26/2022), Somatic dysfunction, Stroke (HCC) (11/2022), Urinary incontinence, and Uterine fibroid.   Past Surgical History: She  has a past surgical history that includes Umbilical hernia repair (2010); Hammer toe surgery (Right, 1990); Nasal septum surgery (2000); Shoulder arthroscopy w/ rotator cuff repair (Right, 10/09/2011); Pelvic laparoscopy (1990); Rotator cuff repair (Left, 11/11/2013); Cataract extraction w/ intraocular lens implant (Bilateral,  2018); Carpal tunnel release (Bilateral); Dilatation & curettage/hysteroscopy with myosure (N/A, 08/22/2021); Operative ultrasound (N/A, 08/22/2021); LEFT HEART CATH AND CORONARY ANGIOGRAPHY (N/A, 01/25/2022); CORONARY STENT INTERVENTION (N/A, 01/26/2022); Dilation and curettage of uterus (2000); Colonoscopy with esophagogastroduodenoscopy (egd) and esophageal dilation (ed) (03/12/2018); Colonoscopy with esophagogastroduodenoscopy (egd) (2023); Dilatation & curettage/hysteroscopy with myosure (N/A, 03/21/2023); Hysteroscopy with D & C (N/A, 04/25/2023); and Intrauterine device (iud) insertion (N/A, 04/25/2023).   Medications: She has a current medication list which includes the following prescription(s): amlodipine , ascorbic acid, aspirin  ec, black currant seed oil, carboxymethylcellulose, coenzyme q10, cyanocobalamin , cyanocobalamin , repatha  sureclick, fiber, furosemide , metoprolol  succinate, multiple vitamins-minerals, nitroglycerin , fish oil, potassium chloride  sa, probiotic product, xlear sinus care spray, tart cherry, triamcinolone  cream, turmeric-ginger, vibegron, and vitamin d -vitamin k.   Allergies: Patient is allergic to meloxicam , molds & smuts, cefixime, celebrex [celecoxib], cobalt, gabapentin , levofloxacin, molybdenum, other, penicillins, robaxin [methocarbamol], statins, tomato, adhesive [tape], dust mite extract, tetracycline hcl, and tetracyclines & related.   Social History: Patient  reports that she has never smoked. She has never used smokeless tobacco. She reports that she does not drink alcohol  and does not use drugs.     OBJECTIVE    Lab Results  Component Value Date   COLORU yellow 10/18/2023   CLARITYU clear 10/18/2023   GLUCOSEUR negative 10/18/2023   BILIRUBINUR negative 10/18/2023   KETONESU negative 12/12/2022   SPECGRAV 1.010 10/18/2023   RBCUR negative 10/18/2023   PHUR 6.0 10/18/2023   PROTEINUR NEGATIVE 09/26/2023   UROBILINOGEN 0.2 10/18/2023   LEUKOCYTESUR  Negative 10/18/2023     Physical Exam: Vitals:   10/18/23 0935  BP: (!) 177/72  Pulse: 81   Gen: No apparent distress, A&O x 3.  Detailed Urogynecologic Evaluation:  Deferred.   Procedure: The patient was placed in the  sitting position and the right lower extremity was prepped in the usual fashion. The PTNS needle was then inserted at a 60 degree angle, 5 cm cephalad and 2 cm posterior to the medial malleolus. The PTNS unit was then programmed and an optimal response was noted at 8 milliamps. The PTNS stimulation was then performed at this setting for 30 minutes without incident and the patient tolerated the procedure well. The needle was removed and hemostasis was noted.      ASSESSMENT AND PLAN    Ms. Ragan is a 73 y.o. with:  1. Continuous leakage of urine    No sign of UTI on exam today. We discussed that there is a possible component of her back pain/spasms as related to her bladder loss. We discussed that the PTNS targets the nerve in the ankle that goes up to S3 in the back. We also discussed that if the back is causing significant problems we may need to consider a more direct approach to treating the bladder. We discussed using a Beta3 Agonist (Gemtesa 75mg  daily as she is not eligible for Myrbetriq due to elevated blood pressure). She is open to the idea but wants to see what today's PTNS can do for her. She has had good success in the past with the PTNS and wants to see if there is some effectiveness. I encouraged patient to try the PTNS today and I will send in a script for Gemtesa 75mg  to be on hold at the pharmacy should she not see any relief in the next week. She is open to this plan of care and will send a MyChart message to let me know if there was improvement with the PTNS or if she will try the Gemtesa 75mg  daily. If she starts the medication will follow up with her in 4-6 weeks or sooner if needed.    Davisha Linthicum G Emely Fahy, NP

## 2023-10-26 ENCOUNTER — Encounter: Payer: Self-pay | Admitting: Gynecologic Oncology

## 2023-11-01 ENCOUNTER — Encounter: Payer: Self-pay | Admitting: Gynecologic Oncology

## 2023-11-01 ENCOUNTER — Inpatient Hospital Stay: Attending: Gynecologic Oncology | Admitting: Gynecologic Oncology

## 2023-11-01 VITALS — BP 152/80 | HR 82 | Temp 98.4°F | Resp 20 | Wt 233.2 lb

## 2023-11-01 DIAGNOSIS — N8502 Endometrial intraepithelial neoplasia [EIN]: Secondary | ICD-10-CM | POA: Insufficient documentation

## 2023-11-01 DIAGNOSIS — R32 Unspecified urinary incontinence: Secondary | ICD-10-CM | POA: Diagnosis not present

## 2023-11-01 DIAGNOSIS — Z975 Presence of (intrauterine) contraceptive device: Secondary | ICD-10-CM | POA: Diagnosis not present

## 2023-11-01 DIAGNOSIS — Z79890 Hormone replacement therapy (postmenopausal): Secondary | ICD-10-CM | POA: Insufficient documentation

## 2023-11-01 NOTE — Patient Instructions (Signed)
 It was good to see you today.  I will call you with your biopsy results, which should be back next week.  We will tentatively plan on a repeat visit in 3 months for follow-up and biopsy.

## 2023-11-01 NOTE — Progress Notes (Addendum)
 Gynecologic Oncology Return Clinic Visit  11/01/23  Reason for Visit: follow-up   Treatment History: Presented with PMB in 2023.  Pelvic ultrasound in 06/2021 showed multiple uterine and likely cervical leiomyoma, largest 3.6 cm.  08/2021: D&C hysteroscopy with benign polyp and submucosal fibroid sampling. No hyperplasia or malignancy. 11/22/22: EMB with scant atrophic endometrium. 02/06/23: SIS with filling defect:  31 x 10 mm, and thickened endometrium. EMB with benign endometrium.  03/21/23: Hysteroscopy, endometrial sampling. Findings of endometrial polyp.  Pathology - scanty inactive endometrium, benign endocervix. Endometrial polypectomy with [name] involving fragments of polyp, microscopic foci borderline on well-differentiated endometrioid adenocarcinoma.    04/25/23: Hysteroscopy, endometrial sampling, Mirena  IUD insertion   Interval History: Doing well.  Reports had bloody mucus discharge until about a month ago, now having more clear discharge.  Has been having some back and pelvic pain, notices the symptoms when she has more discharge.  Has had some issues related to urinary incontinence, saw urogyn recently and started Gemtesa which has helped significantly.  Also dealing with some knee issues.  Reports baseline bowel function.  Past Medical/Surgical History: Past Medical History:  Diagnosis Date   Abnormal uterine bleeding    Allergic contact dermatitis due to metals    Allergic rhinitis, cause unspecified    Bursitis of left shoulder    Chronic pain syndrome    Complication of anesthesia    03-20-2023  pt stated has issues with memory post op   Coronary artery disease 01/2022   cardiologist--- dr myrtis gull;   positive PET NUC 01-24-2022;  cath 01-25-2022/  01-26-2022  multivessel CAD w/ severe global LV contractibility ef 55-60% LVEDP 30mm;    s/p  PTCA balloon mCX and OM2 and DES x1 to OM1   Dyspareunia    Endometrial mass    Family history of ovarian cancer    Full dentures     GERD (gastroesophageal reflux disease)    improved   History of adenomatous polyp of colon    History of cerebral infarction    neurologist--- dr marla. patel (lov note in epic 12-21-2021 for vertigo & HA)  MRI imaging incidental finding remote small vessal infarct right basal ganglia   History of esophageal stricture    s/p  EGD w/ dilatation 2010 and 03-12-2018   History of kidney stones    HTN (hypertension)    Hyperlipidemia    Interstitial lung disease (HCC)    followed by dr shellia ([facility] [place], [place])   long COVID related mild ILD with UIP pattern   Multiple pulmonary nodules    Neuromuscular disorder (HCC)    OA (osteoarthritis)    knees;  hands   OAB (overactive bladder)    OSA on CPAP    followed by pulmonology--- dr shellia;   HST in epic 03-04-2015  moderate osa   Polyarthralgia    Pre-diabetes    S/P drug eluting coronary stent placement 01/26/2022   PTCA balloon angioplasty and DES x1 toOM1   Somatic dysfunction    multiple sites;    lumbar & sacral regions;  ribs;   cervical spine   Stroke (HCC) 11/2022   dx TIA   Urinary incontinence    Uterine fibroid     Past Surgical History:  Procedure Laterality Date   CARPAL TUNNEL RELEASE Bilateral    left 05-21-2020;   right  1998   CATARACT EXTRACTION W/ INTRAOCULAR LENS IMPLANT Bilateral 2018   COLONOSCOPY WITH ESOPHAGOGASTRODUODENOSCOPY (EGD)  2023   [facility] [place]   COLONOSCOPY WITH ESOPHAGOGASTRODUODENOSCOPY (EGD) AND ESOPHAGEAL DILATION (ED)  03/12/2018   @ [facility]   CORONARY STENT INTERVENTION N/A 01/26/2022   Procedure: CORONARY STENT INTERVENTION;  Surgeon: Verlin Lonni BIRCH, MD;  Location: [facility];  Service: Cardiovascular;  Laterality: N/A;   DILATATION & CURETTAGE/HYSTEROSCOPY WITH MYOSURE N/A 08/22/2021   Procedure: DILATATION & CURETTAGE/HYSTEROSCOPY WITH MYOSURE RESECTION OF ENDOMETRIAL MASS;  Surgeon: Cathlyn JAYSON Nikki Bobie FORBES, MD;  Location: [facility];  Service: Gynecology;  Laterality: N/A;   DILATATION & CURETTAGE/HYSTEROSCOPY WITH MYOSURE N/A 03/21/2023   Procedure: DILATATION & CURETTAGE/HYSTEROSCOPY WITH MYOSURE;  Surgeon: Dallie Vera GAILS, MD;  Location: [facility];  Service: Gynecology;  Laterality: N/A;  [facility]   DILATION AND CURETTAGE OF UTERUS  2000   HAMMER TOE SURGERY Right 1990   HYSTEROSCOPY WITH D & C N/A 04/25/2023   Procedure: DILATATION AND CURETTAGE /HYSTEROSCOPY;  Surgeon: Viktoria Comer SAUNDERS, MD;  Location: [facility];  Service: Gynecology;  Laterality: N/A;  MYOSURE   INTRAUTERINE DEVICE (IUD) INSERTION N/A 04/25/2023   Procedure: INSERTION, INTRAUTERINE DEVICE;  Surgeon: Viktoria Comer SAUNDERS, MD;  Location: [facility];  Service: Gynecology;  Laterality: N/A;   LEFT HEART CATH AND CORONARY ANGIOGRAPHY N/A 01/25/2022   Procedure: LEFT HEART CATH AND CORONARY ANGIOGRAPHY;  Surgeon: Burnard Debby LABOR, MD;  Location: [facility];  Service: Cardiovascular;  Laterality: N/A;   NASAL SEPTUM SURGERY  2000   OPERATIVE ULTRASOUND N/A 08/22/2021   Procedure: OPERATIVE ULTRASOUND GUIDED HYSTEROSCOPY;  Surgeon: Cathlyn JAYSON Nikki Bobie FORBES, MD;  Location: [facility];  Service: Gynecology;  Laterality: N/A;   PELVIC LAPAROSCOPY  1990   remembers pelvic pain, normal findings   ROTATOR CUFF REPAIR Left 11/11/2013   @ [facility]  by  Dr. Dozier   SHOULDER ARTHROSCOPY W/ ROTATOR CUFF REPAIR Right 10/09/2011   @[facility]  by Dr. Dozier   UMBILICAL HERNIA REPAIR  2010   Patient reports mesh was used for the repair.    Family History  Problem Relation Age of Onset   Rheumatologic disease Mother    Diabetes Mother    Heart attack Father    Basal cell carcinoma Father    Rheumatologic disease Sister    Arthritis Sister        --Rheumatoid arthritis   Ovarian cancer Sister 11       hyst in 17s, surgery in 66s, no chemo, still alive   Crohn's disease Brother    Kidney failure Brother    Ulcerative colitis Brother    Glaucoma  Brother    Cancer Niece 86       gyn   Breast cancer Neg Hx    Prostate cancer Neg Hx    Colon cancer Neg Hx    Pancreatic cancer Neg Hx    Endometrial cancer Neg Hx     Social History   Socioeconomic History   Marital status: Married    Spouse name: Not on file   Number of children: Not on file   Years of education: Not on file   Highest education level: Not on file  Occupational History   Occupation: Retired  Tobacco Use   Smoking status: Never   Smokeless tobacco: Never  Vaping Use   Vaping status: Never Used  Substance and Sexual Activity   Alcohol  use: No    Alcohol /week: 0.0 standard drinks of alcohol    Drug use: Never   Sexual activity: Not Currently  Partners: Male    Birth control/protection: Post-menopausal  Other Topics Concern   Not on file  Social History Narrative   Are you right handed or left handed? Right handed   Are you currently employed ? No retired   What is your current occupation?   Do you live at home alone? No   Who lives with you? Lives with husband.    What type of home do you live in: 1 story or 2 story? Two story home.       Social Drivers of Corporate investment banker Strain: Not on file  Food Insecurity: Low Risk  (06/27/2023)   Received from [facility]   Hunger Vital Sign    Within the past 12 months, you worried that your food would run out before you got money to buy more: Never true    Within the past 12 months, the food you bought just didn't last and you didn't have money to get more. : Never true  Transportation Needs: No Transportation Needs (06/27/2023)   Received from [facility]    In the past 12 months, has lack of reliable transportation kept you from medical appointments, meetings, work or from getting things needed for daily living? : No  Physical Activity: Not on file  Stress: Not on file  Social Connections: Not on file    Current Medications:  Current Outpatient Medications:     amLODipine  (NORVASC ) 5 MG tablet, Take 1 tablet (5 mg total) by mouth daily., Disp: 90 tablet, Rfl: 3   ascorbic acid (VITAMIN C ) 500 MG tablet, Take 250 mg by mouth daily., Disp: , Rfl:    aspirin  EC 81 MG tablet, Take 1 tablet (81 mg total) by mouth daily. Swallow whole., Disp: 100 tablet, Rfl: 3   BLACK CURRANT SEED OIL PO, Take by mouth., Disp: , Rfl:    carboxymethylcellulose (REFRESH PLUS) 0.5 % SOLN, Place 1 drop into both eyes 2 (two) times daily as needed (for dry eyes)., Disp: , Rfl:    Coenzyme Q10 200 MG capsule, Take 200 mg by mouth daily after lunch., Disp: , Rfl:    cyanocobalamin  (VITAMIN B12) 1000 MCG tablet, Take 1,000 mcg by mouth every Monday, Wednesday, and Friday., Disp: , Rfl:    cyanocobalamin  (VITAMIN B12) 1000 MCG/ML injection, Inject 1,000 mcg into the muscle every 30 (thirty) days., Disp: , Rfl:    Evolocumab  (REPATHA  SURECLICK) 140 MG/ML SOAJ, Inject 140 mg into the skin every 14 (fourteen) days., Disp: 2 mL, Rfl: 11   FIBER ADULT GUMMIES PO, Take 2-3 each by mouth 2 (two) times daily as needed (constipation)., Disp: , Rfl:    furosemide  (LASIX ) 40 MG tablet, Take 40 mg by mouth every 3rd day, Disp: 90 tablet, Rfl: 3   metoprolol  succinate (TOPROL -XL) 25 MG 24 hr tablet, Take 1 tablet (25 mg total) by mouth daily after lunch., Disp: 90 tablet, Rfl: 3   Multiple Vitamins-Minerals (SUPER MULTI-VITAMIN PO), Take 1 capsule by mouth daily. Solgar Formula VM-75, Disp: , Rfl:    nitroGLYCERIN  (NITROSTAT ) 0.4 MG SL tablet, Place 1 tablet (0.4 mg total) under the tongue every 5 (five) minutes x 3 doses as needed for chest pain., Disp: 25 tablet, Rfl: 1   Omega-3 Fatty Acids (FISH OIL) 500 MG CAPS, Take 500 mg by mouth daily. Taking 1,00mg  every other day, Disp: , Rfl:    potassium chloride  SA (KLOR-CON  M) 20 MEQ tablet, Take 1 tablet every 3 rd day,  Disp: 90 tablet, Rfl: 3   Probiotic Product (PROBIOTIC ADVANCED PO), Take 1 capsule by mouth daily.  Suprema Dophilus probiotic,  Disp: , Rfl:    Sodium Chloride -Xylitol (XLEAR SINUS CARE SPRAY) SOLN, Place 2 sprays into the nose at bedtime. Rinse only, Disp: , Rfl:    TART CHERRY PO, Take 1 tablet by mouth daily. Chewable, Disp: , Rfl:    triamcinolone  cream (KENALOG) 0.1 %, Apply 1 Application topically 2 (two) times daily as needed (eczema)., Disp: , Rfl:    TURMERIC-GINGER PO, Take 1 capsule by mouth 2 (two) times daily., Disp: , Rfl:    Vibegron 75 MG TABS, Take 1 tablet (75 mg total) by mouth daily., Disp: 30 tablet, Rfl: 5   VITAMIN D -VITAMIN K PO, Take 1 capsule by mouth every other day. Vit d 5000 units and 100mcg of K, Disp: , Rfl:   Review of Systems: + Fatigue, ringing in ears, leg swelling, abdominal pain, incontinence, frequency, pelvic pain, vaginal discharge, joint pain, back pain, muscle pain/cramps, problems with walking, bruising/bleeding easily Denies appetite changes, fevers, chills, unexplained weight changes. Denies hearing loss, neck lumps or masses, mouth sores or voice changes. Denies cough or wheezing.  Denies shortness of breath. Denies chest pain or palpitations.  Denies abdominal distention, blood in stools, constipation, diarrhea, nausea, vomiting, or early satiety. Denies pain with intercourse, dysuria, hematuria. Denies hot flashes.   Denies joint pain. Denies itching, rash, or wounds. Denies dizziness, headaches, numbness or seizures. Denies swollen lymph nodes or glands. Denies anxiety, depression, confusion, or decreased concentration.  Physical Exam: BP (!) 152/80 Comment: manual recheck, NP notified, no S&S will monitor and f/u with PCP  Pulse 82   Temp 98.4 F (36.9 C) (Oral)   Resp 20   Wt 233 lb 3.2 oz (105.8 kg)   LMP 08/16/1992   SpO2 100%   BMI 45.54 kg/m  General: Alert, oriented, no acute distress. HEENT: Posterior oropharynx clear, sclera anicteric. Chest: Unlabored breathing on room air.   GU: Normal appearing external genitalia without erythema, excoriation,  or lesions.  Speculum exam reveals vaginal mucosa normal-appearing, cervix normal.  IUD strings protruding approximately 4 cm from the cervical os.   Endometrial biopsy procedure Preoperative diagnosis: [name] Postoperative diagnosis: Same as above Physician: Viktoria MD Estimated blood loss: Minimal Specimens: Endometrial biopsy Procedure: After the procedure was discussed with the patient including risks and benefits, she gave verbal consent.  She was then placed in dorsolithotomy position and a speculum was placed in the vagina.  Once the cervix was well visualized it was cleansed with Betadine  x3.  A tenaculum was placed on the anterior lip of the cervix.  An endometrial Pipelle was then passed to a depth of just under 7 cm.  2 passes were performed with scant tissue obtained.  This was placed in formalin.  Overall the patient tolerated the procedure well.  All instruments were removed from the vagina.  Laboratory & Radiologic Studies: None new  Assessment & Plan: Kerry [name] is a 73 y.o. woman with [name] now s/p D&C (04/25/23) with Mirena  IUD placement. Repeat endometrial biopsy in 07/2023 showed focal residual [name] with progestin effect.   Patient doing well.  Had bloody discharge again until about a month ago after her last biopsy.  [place] , had to stop doing this due to discharge.  I encouraged her to speak with her PCP again about other options for weight loss.  I will contact her  with endometrial biopsy results. Tentative plan for follow-up in 3 months with repeat biopsy.    The patient is following with urogynecology in the setting of her urinary incontinence.  20 minutes of total time was spent for this patient encounter, including preparation, face-to-face counseling with the patient and coordination of care, and documentation of the encounter.  Comer Dollar, MD  Division of Gynecologic Oncology  Department of Obstetrics and  Gynecology  [facility]

## 2023-11-06 LAB — SURGICAL PATHOLOGY

## 2023-11-06 NOTE — Progress Notes (Unsigned)
 [name] [address] [place] [address] Phone: [phone] Subjective:   Kerry [name], am serving as a scribe for Dr. Arthea Claudene.  I'm seeing this patient by the request  of:  Claudene Kerry HERO, DO  CC: low back pain   YEP:Kerry [name]  09/26/2023 Severe bone-on-bone arthritic changes of the right knee noted.  Last injection was a year and a half ago.  At this point patient is in severe pain and barely able to bear weight.  Has not lost enough weight to be a surgical candidate.  BMI is over 45.  At this point given injection to see how patient responds.  Hopefully this will help her with ambulation but once again more concern for urinary tract infection or kidney stone.     Low back pain is severe.  Patient does have severe positive CVA tenderness on the right side.  Very concerned that patient either has a urinary tract infection versus the possibility of a kidney stone.  Has had a history of kidney stones previously.  Patient has had either hematuria or is having breakthrough bleeding while they are watching the precancerous cells of her uterus.  At this point we discussed different treatment options for the patient.  Given a Toradol  injection today, laboratory workup, getting KUB and is scheduled for a CT renal protocol for tomorrow.  We discussed that if worsening symptoms that she needs to seek medical attention immediately and if any of the labs come back significantly concerning.  Do need to consider the possibility also of MRI to further evaluate the uterus but at this moment do not think that is potentially contributing at this time.  Follow-up with me again 6 weeks otherwise.  Patient is accompanied with husband who has worked in medicine as well and understands which symptoms to look out for.     Updated 11/08/2023 Kerry [name] is a 73 y.o. female coming in with complaint of fatigue. She is doing a lot better. Intermittent pain in  R knee and R SI joint. Pain was radiating into the glute. Had a pop in the L hip and the R SI joint started to feel better. Using old [place] shoes which have been helpful.IUD Is in better location she feels which is helpful to decrease her hip pain.   Pain in anterior aspect of R shoulder after pushing up on the bed last week.   Six weeks ago she had pain in R side of T-spine near scapula. Believed a rib was out of place. Some residual pain today.   Urogyn believes that her S3 joint is aggravated and that is why she is having bladder issues. Taking gemtesa. Unsure if medication is working.        Past Medical History:  Diagnosis Date   Abnormal uterine bleeding    Allergic contact dermatitis due to metals    Allergic rhinitis, cause unspecified    Bursitis of left shoulder    Chronic pain syndrome    Complication of anesthesia    03-20-2023  pt stated has issues with memory post op   Coronary artery disease 01/2022   cardiologist--- dr myrtis gull;   positive PET NUC 01-24-2022;  cath 01-25-2022/  01-26-2022  multivessel CAD w/ severe global LV contractibility ef 55-60% LVEDP 30mm;    s/p  PTCA balloon mCX and OM2 and DES x1 to OM1   Dyspareunia    Endometrial mass    Family history of ovarian  cancer    Full dentures    GERD (gastroesophageal reflux disease)    improved   History of adenomatous polyp of colon    History of cerebral infarction    neurologist--- dr marla. patel (lov note in epic 12-21-2021 for vertigo & HA)  MRI imaging incidental finding remote small vessal infarct right basal ganglia   History of esophageal stricture    s/p  EGD w/ dilatation 2010 and 03-12-2018   History of kidney stones    HTN (hypertension)    Hyperlipidemia    Interstitial lung disease (HCC)    followed by dr shellia ([facility] [place], [place])   long COVID related mild ILD with UIP pattern   Multiple pulmonary nodules    Neuromuscular disorder (HCC)    OA (osteoarthritis)    knees;  hands    OAB (overactive bladder)    OSA on CPAP    followed by pulmonology--- dr shellia;   HST in epic 03-04-2015  moderate osa   Polyarthralgia    Pre-diabetes    S/P drug eluting coronary stent placement 01/26/2022   PTCA balloon angioplasty and DES x1 toOM1   Somatic dysfunction    multiple sites;    lumbar & sacral regions;  ribs;   cervical spine   Stroke (HCC) 11/2022   dx TIA   Urinary incontinence    Uterine fibroid    Past Surgical History:  Procedure Laterality Date   CARPAL TUNNEL RELEASE Bilateral    left 05-21-2020;   right  1998   CATARACT EXTRACTION W/ INTRAOCULAR LENS IMPLANT Bilateral 2018   COLONOSCOPY WITH ESOPHAGOGASTRODUODENOSCOPY (EGD)  [facility]    COLONOSCOPY WITH ESOPHAGOGASTRODUODENOSCOPY (EGD) AND ESOPHAGEAL DILATION (ED)  03/12/2018   @ [facility]   CORONARY STENT INTERVENTION N/A 01/26/2022   Procedure: CORONARY STENT INTERVENTION;  Surgeon: Verlin Lonni BIRCH, MD;  Location: [facility];  Service: Cardiovascular;  Laterality: N/A;   DILATATION & CURETTAGE/HYSTEROSCOPY WITH MYOSURE N/A 08/22/2021   Procedure: DILATATION & CURETTAGE/HYSTEROSCOPY WITH MYOSURE RESECTION OF ENDOMETRIAL MASS;  Surgeon: Cathlyn JAYSON Nikki Bobie FORBES, MD;  Location: [facility];  Service: Gynecology;  Laterality: N/A;   DILATATION & CURETTAGE/HYSTEROSCOPY WITH MYOSURE N/A 03/21/2023   Procedure: DILATATION & CURETTAGE/HYSTEROSCOPY WITH MYOSURE;  Surgeon: Dallie Vera GAILS, MD;  Location: [facility];  Service: Gynecology;  Laterality: N/A;  [facility]   DILATION AND CURETTAGE OF UTERUS  2000   HAMMER TOE SURGERY Right 1990   HYSTEROSCOPY WITH D & C N/A 04/25/2023   Procedure: DILATATION AND CURETTAGE /HYSTEROSCOPY;  Surgeon: Viktoria Comer SAUNDERS, MD;  Location: [facility];  Service: Gynecology;  Laterality: N/A;  MYOSURE   INTRAUTERINE DEVICE (IUD) INSERTION N/A 04/25/2023   Procedure: INSERTION, INTRAUTERINE DEVICE;  Surgeon: Viktoria Comer SAUNDERS, MD;   Location: [facility];  Service: Gynecology;  Laterality: N/A;   LEFT HEART CATH AND CORONARY ANGIOGRAPHY N/A 01/25/2022   Procedure: LEFT HEART CATH AND CORONARY ANGIOGRAPHY;  Surgeon: Burnard Debby LABOR, MD;  Location: [facility];  Service: Cardiovascular;  Laterality: N/A;   NASAL SEPTUM SURGERY  2000   OPERATIVE ULTRASOUND N/A 08/22/2021   Procedure: OPERATIVE ULTRASOUND GUIDED HYSTEROSCOPY;  Surgeon: Cathlyn JAYSON Nikki Bobie FORBES, MD;  Location: [facility];  Service: Gynecology;  Laterality: N/A;   PELVIC LAPAROSCOPY  1990   remembers pelvic pain, normal findings   ROTATOR CUFF REPAIR Left 11/11/2013   @ [facility]  by  Dr. Dozier  SHOULDER ARTHROSCOPY W/ ROTATOR CUFF REPAIR Right 10/09/2011   @[facility]  by Dr. Dozier   UMBILICAL HERNIA REPAIR  2010   Patient reports mesh was used for the repair.   Social History   Socioeconomic History   Marital status: Married    Spouse name: Not on file   Number of children: Not on file   Years of education: Not on file   Highest education level: Not on file  Occupational History   Occupation: Retired  Tobacco Use   Smoking status: Never   Smokeless tobacco: Never  Vaping Use   Vaping status: Never Used  Substance and Sexual Activity   Alcohol  use: No    Alcohol /week: 0.0 standard drinks of alcohol    Drug use: Never   Sexual activity: Not Currently    Partners: Male    Birth control/protection: Post-menopausal  Other Topics Concern   Not on file  Social History Narrative   Are you right handed or left handed? Right handed   Are you currently employed ? No retired   What is your current occupation?   Do you live at home alone? No   Who lives with you? Lives with husband.    What type of home do you live in: 1 story or 2 story? Two story home.       Social Drivers of Corporate investment banker Strain: Not on file  Food Insecurity: Low Risk  (06/27/2023)   Received from [facility]   Hunger Vital Sign    Within the  past 12 months, you worried that your food would run out before you got money to buy more: Never true    Within the past 12 months, the food you bought just didn't last and you didn't have money to get more. : Never true  Transportation Needs: No Transportation Needs (06/27/2023)   Received from [facility]    In the past 12 months, has lack of reliable transportation kept you from medical appointments, meetings, work or from getting things needed for daily living? : No  Physical Activity: Not on file  Stress: Not on file  Social Connections: Not on file   Allergies  Allergen Reactions   Meloxicam  Other (See Comments)    Made patient have stomach pain ,sore throat, joint pains, face flush    Molds & Smuts Other (See Comments), Palpitations, Shortness Of Breath and Swelling   Cefixime Swelling   Celebrex [Celecoxib] Swelling   [place] Other (See Comments)    Per allergist   Gabapentin  Other (See Comments)    Joint pain   Levofloxacin Other (See Comments)    Muscle and joint pain   Molybdenum Other (See Comments)    Per allergist   Other Other (See Comments)    TEQUIN. TEQUIN - JOINT AND MUSCLE PAIN Other Reaction: painful joints Tantal Metal   Penicillins Hives   Robaxin [Methocarbamol]    Statins Other (See Comments)    Muscle pain   Tomato Hives   Adhesive [Tape] Rash    Sensitive to EKG leads; sensitive to 41M Micropore tape   Dust Mite Extract Itching, Palpitations and Other (See Comments)   Tetracycline Hcl Rash    Can take Doxy   Tetracyclines & Related Rash   Family History  Problem Relation Age of Onset   Rheumatologic disease Mother    Diabetes Mother    Heart attack Father    Basal cell carcinoma Father    Rheumatologic disease Sister  Arthritis Sister        --Rheumatoid arthritis   Ovarian cancer Sister 31       hyst in 96s, surgery in 43s, no chemo, still alive   Crohn's disease Brother    Kidney failure Brother    Ulcerative colitis  Brother    Glaucoma Brother    Cancer Niece 76       gyn   Breast cancer Neg Hx    Prostate cancer Neg Hx    Colon cancer Neg Hx    Pancreatic cancer Neg Hx    Endometrial cancer Neg Hx      Current Outpatient Medications (Cardiovascular):    amLODipine  (NORVASC ) 5 MG tablet, Take 1 tablet (5 mg total) by mouth daily.   Evolocumab  (REPATHA  SURECLICK) 140 MG/ML SOAJ, Inject 140 mg into the skin every 14 (fourteen) days.   furosemide  (LASIX ) 40 MG tablet, Take 40 mg by mouth every 3rd day   metoprolol  succinate (TOPROL -XL) 25 MG 24 hr tablet, Take 1 tablet (25 mg total) by mouth daily after lunch.   nitroGLYCERIN  (NITROSTAT ) 0.4 MG SL tablet, Place 1 tablet (0.4 mg total) under the tongue every 5 (five) minutes x 3 doses as needed for chest pain.  Current Outpatient Medications (Respiratory):    Sodium Chloride -Xylitol (XLEAR SINUS CARE SPRAY) SOLN, Place 2 sprays into the nose at bedtime. Rinse only  Current Outpatient Medications (Analgesics):    aspirin  EC 81 MG tablet, Take 1 tablet (81 mg total) by mouth daily. Swallow whole.  Current Outpatient Medications (Hematological):    cyanocobalamin  (VITAMIN B12) 1000 MCG tablet, Take 1,000 mcg by mouth every Monday, Wednesday, and Friday.   cyanocobalamin  (VITAMIN B12) 1000 MCG/ML injection, Inject 1,000 mcg into the muscle every 30 (thirty) days.  Current Outpatient Medications (Other):    ascorbic acid (VITAMIN C ) 500 MG tablet, Take 250 mg by mouth daily.   BLACK CURRANT SEED OIL PO, Take by mouth.   carboxymethylcellulose (REFRESH PLUS) 0.5 % SOLN, Place 1 drop into both eyes 2 (two) times daily as needed (for dry eyes).   Coenzyme Q10 200 MG capsule, Take 200 mg by mouth daily after lunch.   FIBER ADULT GUMMIES PO, Take 2-3 each by mouth 2 (two) times daily as needed (constipation).   Multiple Vitamins-Minerals (SUPER MULTI-VITAMIN PO), Take 1 capsule by mouth daily. Solgar Formula VM-75   Omega-3 Fatty Acids (FISH OIL) 500 MG  CAPS, Take 500 mg by mouth daily. Taking 1,00mg  every other day   potassium chloride  SA (KLOR-CON  M) 20 MEQ tablet, Take 1 tablet every 3 rd day   Probiotic Product (PROBIOTIC ADVANCED PO), Take 1 capsule by mouth daily.  Suprema Dophilus probiotic   TART CHERRY PO, Take 1 tablet by mouth daily. Chewable   triamcinolone  cream (KENALOG) 0.1 %, Apply 1 Application topically 2 (two) times daily as needed (eczema).   TURMERIC-GINGER PO, Take 1 capsule by mouth 2 (two) times daily.   Vibegron 75 MG TABS, Take 1 tablet (75 mg total) by mouth daily.   VITAMIN D -VITAMIN K PO, Take 1 capsule by mouth every other day. Vit d 5000 units and 100mcg of K   Reviewed prior external information including notes and imaging from  primary care provider As well as notes that were available from care everywhere and other healthcare systems.  Past medical history, social, surgical and family history all reviewed in electronic medical record.  No pertanent information unless stated regarding to the chief complaint.   Review  of Systems:  No headache, visual changes, nausea, vomiting, diarrhea, constipation, dizziness, abdominal pain, skin rash, fevers, chills, night sweats, weight loss, swollen lymph nodes, body aches, joint swelling, chest pain, shortness of breath, mood changes. POSITIVE muscle aches  Objective  Blood pressure 132/64, pulse 86, height 5' (1.524 m), last menstrual period 08/16/1992, SpO2 98%.   General: No apparent distress alert and oriented x3 mood and affect normal, dressed appropriately.  HEENT: Pupils equal, extraocular movements intact  Significantly antalgic gait noted.  Patient has poor core strength noted.  Tender to palpation in the paraspinal musculature mostly though in the thoracolumbar juncture today.  Tightness in the low back as well.  Osteopathic findings C2 flexed rotated and side bent right C4 flexed rotated and side bent left T3 extended rotated and side bent right inhaled  third rib T5 extended rotated and side bent right with inhaled rib T9 extended rotated and side bent left T11 extended rotated and side bent left L1 flexed rotated and side bent left L2 flexed rotated and side bent right Sacrum right on right     Impression and Recommendations:  Left lumbar radiculopathy Patient continues to have difficulty overall.  Patient has had for low back pain as well as the knee pain with severe arthritic changes of the knee noted.  Patient is also being treated for precancerous cells of the uterus and needs to lose more weight before she would be a surgical candidate.  Discussed with patient at great length about different medications and as well as a GLP-1 agonist.  Patient will talk to her primary care at this time and would be willing to potentially try this.  We discussed with her the other alternative is continuing to have the biopsies however at some point if it changes she could have cancer and then need chemo and radiation.  This is extremely hypothetical but at the same point warrants discussion of the importance of weight loss at this time for not only this problem but also patient's musculoskeletal complaints.  Follow-up with me again in 6 to 8 weeks.  I personally spent a total of 31 minutes in the care of the patient today including preparing to see the patient, performing a medically appropriate exam/evaluation, counseling and educating, documenting clinical information in the EHR, and communicating results.     Decision today to treat with OMT was based on Physical Exam  After verbal consent patient was treated with , ME, techniques in cervical, thoracic, rib, lumbar and sacral areas, all areas are chronic   Patient tolerated the procedure well with improvement in symptoms  Patient given exercises, stretches and lifestyle modifications  See medications in patient instructions if given  Patient will follow up in 8 weeks  The above documentation  has been reviewed and is accurate and complete Kerry Bisaillon M Klay Sobotka, DO

## 2023-11-07 ENCOUNTER — Ambulatory Visit: Payer: Self-pay | Admitting: Gynecologic Oncology

## 2023-11-08 ENCOUNTER — Ambulatory Visit (INDEPENDENT_AMBULATORY_CARE_PROVIDER_SITE_OTHER): Admitting: Family Medicine

## 2023-11-08 ENCOUNTER — Encounter: Payer: Self-pay | Admitting: Family Medicine

## 2023-11-08 VITALS — BP 132/64 | HR 86 | Ht 60.0 in

## 2023-11-08 DIAGNOSIS — M9901 Segmental and somatic dysfunction of cervical region: Secondary | ICD-10-CM | POA: Diagnosis not present

## 2023-11-08 DIAGNOSIS — M9903 Segmental and somatic dysfunction of lumbar region: Secondary | ICD-10-CM

## 2023-11-08 DIAGNOSIS — M9908 Segmental and somatic dysfunction of rib cage: Secondary | ICD-10-CM | POA: Diagnosis not present

## 2023-11-08 DIAGNOSIS — M9902 Segmental and somatic dysfunction of thoracic region: Secondary | ICD-10-CM

## 2023-11-08 DIAGNOSIS — M5416 Radiculopathy, lumbar region: Secondary | ICD-10-CM | POA: Diagnosis not present

## 2023-11-08 DIAGNOSIS — M9904 Segmental and somatic dysfunction of sacral region: Secondary | ICD-10-CM | POA: Diagnosis not present

## 2023-11-08 NOTE — Assessment & Plan Note (Signed)
 Patient continues to have difficulty overall.  Patient has had for low back pain as well as the knee pain with severe arthritic changes of the knee noted.  Patient is also being treated for precancerous cells of the uterus and needs to lose more weight before she would be a surgical candidate.  Discussed with patient at great length about different medications and as well as a GLP-1 agonist.  Patient will talk to her primary care at this time and would be willing to potentially try this.  We discussed with her the other alternative is continuing to have the biopsies however at some point if it changes she could have cancer and then need chemo and radiation.  This is extremely hypothetical but at the same point warrants discussion of the importance of weight loss at this time for not only this problem but also patient's musculoskeletal complaints.  Follow-up with me again in 6 to 8 weeks.  I personally spent a total of 31 minutes in the care of the patient today including preparing to see the patient, performing a medically appropriate exam/evaluation, counseling and educating, documenting clinical information in the EHR, and communicating results.

## 2023-11-08 NOTE — Patient Instructions (Addendum)
 Omron wrist bp monitor I think GLP1 is a good idea Will reach out to Dr. Rusty See me in 4 weeks

## 2023-11-27 ENCOUNTER — Other Ambulatory Visit: Payer: Self-pay | Admitting: Internal Medicine

## 2023-11-27 DIAGNOSIS — E785 Hyperlipidemia, unspecified: Secondary | ICD-10-CM

## 2023-11-27 DIAGNOSIS — I2510 Atherosclerotic heart disease of native coronary artery without angina pectoris: Secondary | ICD-10-CM

## 2023-11-29 ENCOUNTER — Ambulatory Visit (INDEPENDENT_AMBULATORY_CARE_PROVIDER_SITE_OTHER): Admitting: Obstetrics and Gynecology

## 2023-11-29 ENCOUNTER — Encounter: Payer: Self-pay | Admitting: Obstetrics and Gynecology

## 2023-11-29 VITALS — BP 162/58 | HR 72

## 2023-11-29 DIAGNOSIS — N3281 Overactive bladder: Secondary | ICD-10-CM

## 2023-11-29 DIAGNOSIS — R350 Frequency of micturition: Secondary | ICD-10-CM | POA: Diagnosis not present

## 2023-11-29 MED ORDER — VIBEGRON 75 MG PO TABS: 75.0000 mg | ORAL_TABLET | Freq: Every day | ORAL | 3 refills | Status: DC

## 2023-11-29 NOTE — Patient Instructions (Signed)
 Continue Gemtesa 75mg  daily.   I have sent in the 90 day supply

## 2023-11-29 NOTE — Progress Notes (Signed)
 [facility] Return Visit  SUBJECTIVE  History of Present Illness: Kerry [name] is a 73 y.o. female seen in follow-up for OAB. Plan at last visit was start Gemtesa 75mg  daily.   Patient reports she is doing wonderful and is able to walk around with either a light liner on lasix  days or no liner at all. She states she is doing so well.    Past Medical History: Patient  has a past medical history of Abnormal uterine bleeding, Allergic contact dermatitis due to metals, Allergic rhinitis, cause unspecified, Bursitis of left shoulder, Chronic pain syndrome, Complication of anesthesia, Coronary artery disease (01/2022), Dyspareunia, Endometrial mass, Family history of ovarian cancer, Full dentures, GERD (gastroesophageal reflux disease), History of adenomatous polyp of colon, History of cerebral infarction, History of esophageal stricture, History of kidney stones, HTN (hypertension), Hyperlipidemia, Interstitial lung disease (HCC), Multiple pulmonary nodules, Neuromuscular disorder (HCC), OA (osteoarthritis), OAB (overactive bladder), OSA on CPAP, Polyarthralgia, Pre-diabetes, S/P drug eluting coronary stent placement (01/26/2022), Somatic dysfunction, Stroke (HCC) (11/2022), Urinary incontinence, and Uterine fibroid.   Past Surgical History: She  has a past surgical history that includes Umbilical hernia repair (2010); Hammer toe surgery (Right, 1990); Nasal septum surgery (2000); Shoulder arthroscopy w/ rotator cuff repair (Right, 10/09/2011); Pelvic laparoscopy (1990); Rotator cuff repair (Left, 11/11/2013); Cataract extraction w/ intraocular lens implant (Bilateral, 2018); Carpal tunnel release (Bilateral); Dilatation & curettage/hysteroscopy with myosure (N/A, 08/22/2021); Operative ultrasound (N/A, 08/22/2021); LEFT HEART CATH AND CORONARY ANGIOGRAPHY (N/A, 01/25/2022); CORONARY STENT INTERVENTION (N/A, 01/26/2022); Dilation and curettage of uterus (2000); Colonoscopy with  esophagogastroduodenoscopy (egd) and esophageal dilation (ed) (03/12/2018); Colonoscopy with esophagogastroduodenoscopy (egd) (2023); Dilatation & curettage/hysteroscopy with myosure (N/A, 03/21/2023); Hysteroscopy with D & C (N/A, 04/25/2023); and Intrauterine device (iud) insertion (N/A, 04/25/2023).   Medications: She has a current medication list which includes the following prescription(s): amlodipine , ascorbic acid, aspirin  ec, black currant seed oil, carboxymethylcellulose, coenzyme q10, cyanocobalamin , cyanocobalamin , repatha  sureclick, fiber, furosemide , metoprolol  succinate, multiple vitamins-minerals, nitroglycerin , fish oil, potassium chloride  sa, probiotic product, xlear sinus care spray, tart cherry, triamcinolone  cream, turmeric-ginger, vitamin d -vitamin k, and vibegron.   Allergies: Patient is allergic to meloxicam , molds & smuts, cefixime, celebrex [celecoxib], cobalt, gabapentin , levofloxacin, molybdenum, other, penicillins, robaxin [methocarbamol], statins, tomato, adhesive [tape], dust mite extract, tetracycline hcl, and tetracyclines & related.   Social History: Patient  reports that she has never smoked. She has never used smokeless tobacco. She reports that she does not drink alcohol  and does not use drugs.     OBJECTIVE     Physical Exam: Vitals:   11/29/23 1457  BP: (!) 162/58  Pulse: 72   Gen: No apparent distress, A&O x 3.  Detailed Urogynecologic Evaluation:  Deferred.   ASSESSMENT AND PLAN    [name] is a 73 y.o. with:  1. Overactive bladder   2. Urinary frequency    Continue Gemtesa 75mg  daily. Patient to call with any concerns.   Patient to call if there is any concern.   Tylisa Alcivar G Frida Wahlstrom, NP

## 2023-12-03 MED ORDER — VIBEGRON 75 MG PO TABS: 75.0000 mg | ORAL_TABLET | Freq: Every day | ORAL | 3 refills | Status: AC

## 2023-12-03 NOTE — Addendum Note (Signed)
 Addended by: Jaimes Eckert N on: 12/03/2023 11:01 AM   Modules accepted: Orders

## 2023-12-05 DIAGNOSIS — E559 Vitamin D deficiency, unspecified: Secondary | ICD-10-CM | POA: Diagnosis not present

## 2023-12-05 DIAGNOSIS — E785 Hyperlipidemia, unspecified: Secondary | ICD-10-CM | POA: Diagnosis not present

## 2023-12-05 DIAGNOSIS — E538 Deficiency of other specified B group vitamins: Secondary | ICD-10-CM | POA: Diagnosis not present

## 2023-12-05 DIAGNOSIS — Z131 Encounter for screening for diabetes mellitus: Secondary | ICD-10-CM | POA: Diagnosis not present

## 2023-12-05 NOTE — Progress Notes (Unsigned)
 [name] [address] [place] [address] Phone: [phone] Subjective:   Kerry [name] am a scribe for Dr. Claudene.   I'm seeing this patient by the request  of:  [name], DO  CC: Back and neck pain  [name]  Kerry [name] is a 73 y.o. female coming in with complaint of back and neck pain. OMT on 11/08/2023. Patient states that it is about the same. The right shoulder has been bothering her recently. The right SI joint is doing better. Noticed that the SI joint was better when knee was better. Wants to know why the anti-inflammatory medication that was given to her last time silenced the popping noise in her neck when she turned her head from side to side.           Reviewed prior external information including notes and imaging from previsou exam, outside providers and external EMR if available.   As well as notes that were available from care everywhere and other healthcare systems.  Past medical history, social, surgical and family history all reviewed in electronic medical record.  No pertanent information unless stated regarding to the chief complaint.   Past Medical History:  Diagnosis Date   Abnormal uterine bleeding    Allergic contact dermatitis due to metals    Allergic rhinitis, cause unspecified    Bursitis of left shoulder    Chronic pain syndrome    Complication of anesthesia    03-20-2023  pt stated has issues with memory post op   Coronary artery disease 01/2022   cardiologist--- dr myrtis gull;   positive PET NUC 01-24-2022;  cath 01-25-2022/  01-26-2022  multivessel CAD w/ severe global LV contractibility ef 55-60% LVEDP 30mm;    s/p  PTCA balloon mCX and OM2 and DES x1 to OM1   Dyspareunia    Endometrial mass    Family history of ovarian cancer    Full dentures    GERD (gastroesophageal reflux disease)    improved   History of adenomatous polyp of colon    History of cerebral infarction     neurologist--- dr marla. patel (lov note in epic 12-21-2021 for vertigo & HA)  MRI imaging incidental finding remote small vessal infarct right basal ganglia   History of esophageal stricture    s/p  EGD w/ dilatation 2010 and 03-12-2018   History of kidney stones    HTN (hypertension)    Hyperlipidemia    Interstitial lung disease (HCC)    followed by dr shellia ([facility] [place], [place])   long COVID related mild ILD with UIP pattern   Multiple pulmonary nodules    Neuromuscular disorder (HCC)    OA (osteoarthritis)    knees;  hands   OAB (overactive bladder)    OSA on CPAP    followed by pulmonology--- dr shellia;   HST in epic 03-04-2015  moderate osa   Polyarthralgia    Pre-diabetes    S/P drug eluting coronary stent placement 01/26/2022   PTCA balloon angioplasty and DES x1 toOM1   Somatic dysfunction    multiple sites;    lumbar & sacral regions;  ribs;   cervical spine   Stroke (HCC) 11/2022   dx TIA   Urinary incontinence    Uterine fibroid     Allergies  Allergen Reactions   Meloxicam  Other (See Comments)    Made patient have stomach pain ,sore throat, joint pains, face flush  Molds & Smuts Other (See Comments), Palpitations, Shortness Of Breath and Swelling   Cefixime Swelling   Celebrex [Celecoxib] Swelling   Cobalt Other (See Comments)    Per allergist   Gabapentin  Other (See Comments)    Joint pain   Levofloxacin Other (See Comments)    Muscle and joint pain   Molybdenum Other (See Comments)    Per allergist   Other Other (See Comments)    TEQUIN. TEQUIN - JOINT AND MUSCLE PAIN Other Reaction: painful joints Tantal Metal   Penicillins Hives   Robaxin [Methocarbamol]    Statins Other (See Comments)    Muscle pain   Tomato Hives   Adhesive [Tape] Rash    Sensitive to EKG leads; sensitive to 17M Micropore tape   Dust Mite Extract Itching, Palpitations and Other (See Comments)   Tetracycline Hcl Rash    Can take Doxy   Tetracyclines & Related Rash      Review of Systems:  No headache, visual changes, nausea, vomiting, diarrhea, constipation, dizziness, abdominal pain, skin rash, fevers, chills, night sweats, weight loss, swollen lymph nodes, body aches, joint swelling, chest pain, shortness of breath, mood changes. POSITIVE muscle aches  Objective  Blood pressure (!) 140/70, pulse 98, height 5' (1.524 m), weight 234 lb (106.1 kg), last menstrual period 08/16/1992, SpO2 96%.   General: No apparent distress alert and oriented x3 mood and affect normal, dressed appropriately.  HEENT: Pupils equal, extraocular movements intact  Respiratory: Patient's speak in full sentences and does not appear short of breath  Cardiovascular: No lower extremity edema, non tender, no erythema  Gait MSK:  Back loss of lordosis  Tightness noted faber     Osteopathic findings C5 flexed rotated and side bent left T3 extended rotated and side bent right inhaled rib T6 extended rotated and side bent left L2 flexed rotated and side bent right L3 F RS left  Sacrum right on right     Assessment and Plan:  Low back pain Discussed icing regimen, home exercises, discussed the importance of weight loss.  Patient does have significant arthritis.  We have discussed the potential seronegative rheumatoid arthritis which is in patient's family.  Continue to be active otherwise.  Responded well to muscle energy.  Follow-up again in 6 to 8 weeks    Nonallopathic problems  Decision today to treat with OMT was based on Physical Exam  After verbal consent patient was treated with  ME, FPR techniques in cervical, rib, thoracic, lumbar, and sacral  areas  Patient tolerated the procedure well with improvement in symptoms  Patient given exercises, stretches and lifestyle modifications  See medications in patient instructions if given  Patient will follow up in 4-8 weeks     The above documentation has been reviewed and is accurate and complete Daimien Patmon M  Preslie Depasquale, DO         Note: This dictation was prepared with Dragon dictation along with smaller phrase technology. Any transcriptional errors that result from this process are unintentional.

## 2023-12-06 ENCOUNTER — Ambulatory Visit: Admitting: Family Medicine

## 2023-12-06 ENCOUNTER — Encounter: Payer: Self-pay | Admitting: Family Medicine

## 2023-12-06 VITALS — BP 140/70 | HR 98 | Ht 60.0 in | Wt 234.0 lb

## 2023-12-06 DIAGNOSIS — M9902 Segmental and somatic dysfunction of thoracic region: Secondary | ICD-10-CM | POA: Diagnosis not present

## 2023-12-06 DIAGNOSIS — M9904 Segmental and somatic dysfunction of sacral region: Secondary | ICD-10-CM

## 2023-12-06 DIAGNOSIS — E66813 Obesity, class 3: Secondary | ICD-10-CM | POA: Diagnosis not present

## 2023-12-06 DIAGNOSIS — M9903 Segmental and somatic dysfunction of lumbar region: Secondary | ICD-10-CM | POA: Diagnosis not present

## 2023-12-06 DIAGNOSIS — G8929 Other chronic pain: Secondary | ICD-10-CM

## 2023-12-06 DIAGNOSIS — Z6841 Body Mass Index (BMI) 40.0 and over, adult: Secondary | ICD-10-CM | POA: Diagnosis not present

## 2023-12-06 DIAGNOSIS — M545 Low back pain, unspecified: Secondary | ICD-10-CM | POA: Diagnosis not present

## 2023-12-06 DIAGNOSIS — M9901 Segmental and somatic dysfunction of cervical region: Secondary | ICD-10-CM

## 2023-12-06 DIAGNOSIS — M9908 Segmental and somatic dysfunction of rib cage: Secondary | ICD-10-CM

## 2023-12-06 NOTE — Assessment & Plan Note (Signed)
 Discussed icing regimen, home exercises, discussed the importance of weight loss.  Patient does have significant arthritis.  We have discussed the potential seronegative rheumatoid arthritis which is in patient's family.  Continue to be active otherwise.  Responded well to muscle energy.  Follow-up again in 6 to 8 weeks

## 2023-12-06 NOTE — Assessment & Plan Note (Signed)
 Patient will discuss with primary care provider about the possibility of medications such as a GLP-1, do feel that weight loss would be highly beneficial for this individual.

## 2023-12-06 NOTE — Patient Instructions (Addendum)
 Good to see you. Happy Holidays. Keep doing what you are doing. Talk to primary about the medication.  See me again as scheduled.

## 2023-12-10 DIAGNOSIS — E039 Hypothyroidism, unspecified: Secondary | ICD-10-CM | POA: Diagnosis not present

## 2023-12-10 DIAGNOSIS — Z0000 Encounter for general adult medical examination without abnormal findings: Secondary | ICD-10-CM | POA: Diagnosis not present

## 2023-12-10 DIAGNOSIS — E785 Hyperlipidemia, unspecified: Secondary | ICD-10-CM | POA: Diagnosis not present

## 2023-12-10 DIAGNOSIS — Z1339 Encounter for screening examination for other mental health and behavioral disorders: Secondary | ICD-10-CM | POA: Diagnosis not present

## 2023-12-10 DIAGNOSIS — Z1331 Encounter for screening for depression: Secondary | ICD-10-CM | POA: Diagnosis not present

## 2023-12-10 LAB — LAB REPORT - SCANNED: A1c: 5.9

## 2023-12-17 DIAGNOSIS — I4891 Unspecified atrial fibrillation: Secondary | ICD-10-CM

## 2023-12-17 HISTORY — DX: Unspecified atrial fibrillation: I48.91

## 2023-12-21 ENCOUNTER — Encounter: Payer: Self-pay | Admitting: Internal Medicine

## 2023-12-21 ENCOUNTER — Encounter: Payer: Self-pay | Admitting: Pharmacist

## 2023-12-26 DIAGNOSIS — H35373 Puckering of macula, bilateral: Secondary | ICD-10-CM | POA: Diagnosis not present

## 2023-12-26 DIAGNOSIS — H04123 Dry eye syndrome of bilateral lacrimal glands: Secondary | ICD-10-CM | POA: Diagnosis not present

## 2023-12-26 DIAGNOSIS — H532 Diplopia: Secondary | ICD-10-CM | POA: Diagnosis not present

## 2023-12-31 NOTE — Progress Notes (Unsigned)
 [name] [address] [place] [address] Phone: [phone] Subjective:   Kerry [name], am serving as a scribe for Dr. Arthea Claudene.  I'm seeing this patient by the request  of:  [name], DO  CC: back and neck pain follow up   [name]  Kerry [name] is a 73 y.o. female coming in with complaint of back and neck pain. OMT on 12/06/2023. Patient states   Medications patient has been prescribed:   Taking:      Continues to discuss with other surgeons on medications to see if she can get off them so she can start a GLP-1.   Reviewed prior external information including notes and imaging from previsou exam, outside providers and external EMR if available.   As well as notes that were available from care everywhere and other healthcare systems.  Past medical history, social, surgical and family history all reviewed in electronic medical record.  No pertanent information unless stated regarding to the chief complaint.   Past Medical History:  Diagnosis Date   Abnormal uterine bleeding    Allergic contact dermatitis due to metals    Allergic rhinitis, cause unspecified    Bursitis of left shoulder    Chronic pain syndrome    Complication of anesthesia    03-20-2023  pt stated has issues with memory post op   Coronary artery disease 01/2022   cardiologist--- dr myrtis gull;   positive PET NUC 01-24-2022;  cath 01-25-2022/  01-26-2022  multivessel CAD w/ severe global LV contractibility ef 55-60% LVEDP 30mm;    s/p  PTCA balloon mCX and OM2 and DES x1 to OM1   Dyspareunia    Endometrial mass    Family history of ovarian cancer    Full dentures    GERD (gastroesophageal reflux disease)    improved   History of adenomatous polyp of colon    History of cerebral infarction    neurologist--- dr marla. patel (lov note in epic 12-21-2021 for vertigo & HA)  MRI imaging incidental finding remote small vessal infarct right  basal ganglia   History of esophageal stricture    s/p  EGD w/ dilatation 2010 and 03-12-2018   History of kidney stones    HTN (hypertension)    Hyperlipidemia    Interstitial lung disease (HCC)    followed by dr shellia ([facility] [place], [place])   long COVID related mild ILD with UIP pattern   Multiple pulmonary nodules    Neuromuscular disorder (HCC)    OA (osteoarthritis)    knees;  hands   OAB (overactive bladder)    OSA on CPAP    followed by pulmonology--- dr shellia;   HST in epic 03-04-2015  moderate osa   Polyarthralgia    Pre-diabetes    S/P drug eluting coronary stent placement 01/26/2022   PTCA balloon angioplasty and DES x1 toOM1   Somatic dysfunction    multiple sites;    lumbar & sacral regions;  ribs;   cervical spine   Stroke (HCC) 11/2022   dx TIA   Urinary incontinence    Uterine fibroid     Allergies[1]   Review of Systems:  No headache, visual changes, nausea, vomiting, diarrhea, constipation, dizziness, abdominal pain, skin rash, fevers, chills, night sweats, weight loss, swollen lymph nodes, body aches, joint swelling, chest pain, shortness of breath, mood changes. POSITIVE muscle aches  Objective  Height 5' (1.524 m), last menstrual period 08/16/1992.  General: No apparent distress alert and oriented x3 mood and affect normal, dressed appropriately.  HEENT: Pupils equal, extraocular movements intact  Respiratory: Patient's speak in full sentences and does not appear short of breath  Cardiovascular: No lower extremity edema, non tender, no erythema  Gait severely antalgic MSK:  Back pain significant loss of lordosis noted.  Patient has significant difficulty even with any type of activity.  Difficulty going from seated to standing position.  Once patient is standing seems to do well.  Osteopathic findings  C2 flexed rotated and side bent right C6 flexed rotated and side bent left T3 extended rotated and side bent right inhaled rib T9 extended  rotated and side bent left L2 flexed rotated and side bent right Sacrum right on right     Assessment and Plan:  Obesity Do feel a significant amount of patient's problems is secondary to her obesity at this time.  Would like to refer patient to a specialist for healthy weight and wellness because I do think a GLP-1 would be beneficial for this individual.  Was going to try Zepbound but was going to cost thousand dollars.  Would like to consider sending patient to another provider who is more well versed  in the medications patient even needs weight loss to be able to deal with her continued postmenopausal bleeding and abnormal uterine lining.  Follow-up with me again 6 to 8 weeks otherwise.  Arthritis of knee, degenerative Significant arthritic changes.  Intermittently continues to give difficulty.  Discussed icing regimen and home exercises encouraged weight loss.  Once again we will try to see if we can refer patient accordingly.  Left lumbar radiculopathy Significant arthritic changes noted in the back as well with intermittent radicular symptoms.  You are continuing to be trying to be active.  Gained the weight loss significantly.  Follow-up with me again in 6 to 8 weeks    Nonallopathic problems  Decision today to treat with OMT was based on Physical Exam  After verbal consent patient was treated with  ME, echniques in cervical, rib, thoracic, lumbar, and sacral  areas  Patient tolerated the procedure well with improvement in symptoms  Patient given exercises, stretches and lifestyle modifications  See medications in patient instructions if given  Patient will follow up in 4-8 weeks     The above documentation has been reviewed and is accurate and complete Kerry [name] M Kerry Defreitas, DO         Note: This dictation was prepared with Dragon dictation along with smaller phrase technology. Any transcriptional errors that result from this process are unintentional.            [1]   Allergies Allergen Reactions   Meloxicam  Other (See Comments)    Made patient have stomach pain ,sore throat, joint pains, face flush    Molds & Smuts Other (See Comments), Palpitations, Shortness Of Breath and Swelling   Cefixime Swelling   Celebrex [Celecoxib] Swelling   Cobalt Other (See Comments)    Per allergist   Gabapentin  Other (See Comments)    Joint pain   Levofloxacin Other (See Comments)    Muscle and joint pain   Molybdenum Other (See Comments)    Per allergist   Other Other (See Comments)    TEQUIN. TEQUIN - JOINT AND MUSCLE PAIN Other Reaction: painful joints Tantal Metal   Penicillins Hives   Robaxin [Methocarbamol]    Statins Other (See Comments)    Muscle pain   Tomato Hives  Adhesive [Tape] Rash    Sensitive to EKG leads; sensitive to 45M Micropore tape   Dust Mite Extract Itching, Palpitations and Other (See Comments)   Tetracycline Hcl Rash    Can take Doxy   Tetracyclines & Related Rash

## 2024-01-02 ENCOUNTER — Ambulatory Visit: Admitting: Family Medicine

## 2024-01-02 VITALS — Ht 60.0 in

## 2024-01-02 DIAGNOSIS — E66813 Obesity, class 3: Secondary | ICD-10-CM | POA: Diagnosis not present

## 2024-01-02 DIAGNOSIS — M9908 Segmental and somatic dysfunction of rib cage: Secondary | ICD-10-CM

## 2024-01-02 DIAGNOSIS — M1711 Unilateral primary osteoarthritis, right knee: Secondary | ICD-10-CM | POA: Diagnosis not present

## 2024-01-02 DIAGNOSIS — M9901 Segmental and somatic dysfunction of cervical region: Secondary | ICD-10-CM

## 2024-01-02 DIAGNOSIS — M9904 Segmental and somatic dysfunction of sacral region: Secondary | ICD-10-CM | POA: Diagnosis not present

## 2024-01-02 DIAGNOSIS — N8502 Endometrial intraepithelial neoplasia [EIN]: Secondary | ICD-10-CM

## 2024-01-02 DIAGNOSIS — M5416 Radiculopathy, lumbar region: Secondary | ICD-10-CM | POA: Diagnosis not present

## 2024-01-02 DIAGNOSIS — Z6841 Body Mass Index (BMI) 40.0 and over, adult: Secondary | ICD-10-CM

## 2024-01-02 DIAGNOSIS — M9903 Segmental and somatic dysfunction of lumbar region: Secondary | ICD-10-CM

## 2024-01-02 DIAGNOSIS — R5383 Other fatigue: Secondary | ICD-10-CM

## 2024-01-02 DIAGNOSIS — M9902 Segmental and somatic dysfunction of thoracic region: Secondary | ICD-10-CM | POA: Diagnosis not present

## 2024-01-02 NOTE — Patient Instructions (Signed)
 Lab today I will talk with Dr. Prentiss See you again in 6-8 weeks

## 2024-01-02 NOTE — Assessment & Plan Note (Signed)
 Significant arthritic changes.  Intermittently continues to give difficulty.  Discussed icing regimen and home exercises encouraged weight loss.  Once again we will try to see if we can refer patient accordingly.

## 2024-01-02 NOTE — Assessment & Plan Note (Signed)
 Do feel a significant amount of patient's problems is secondary to her obesity at this time.  Would like to refer patient to a specialist for healthy weight and wellness because I do think a GLP-1 would be beneficial for this individual.  Was going to try Zepbound but was going to cost thousand dollars.  Would like to consider sending patient to another provider who is more well versed  in the medications patient even needs weight loss to be able to deal with her continued postmenopausal bleeding and abnormal uterine lining.  Follow-up with me again 6 to 8 weeks otherwise.

## 2024-01-02 NOTE — Assessment & Plan Note (Signed)
 Significant arthritic changes noted in the back as well with intermittent radicular symptoms.  You are continuing to be trying to be active.  Gained the weight loss significantly.  Follow-up with me again in 6 to 8 weeks

## 2024-01-04 DIAGNOSIS — I342 Nonrheumatic mitral (valve) stenosis: Secondary | ICD-10-CM | POA: Diagnosis not present

## 2024-01-04 DIAGNOSIS — I352 Nonrheumatic aortic (valve) stenosis with insufficiency: Secondary | ICD-10-CM | POA: Diagnosis not present

## 2024-01-04 DIAGNOSIS — I361 Nonrheumatic tricuspid (valve) insufficiency: Secondary | ICD-10-CM | POA: Diagnosis not present

## 2024-01-04 DIAGNOSIS — I517 Cardiomegaly: Secondary | ICD-10-CM | POA: Diagnosis not present

## 2024-01-05 DIAGNOSIS — I2510 Atherosclerotic heart disease of native coronary artery without angina pectoris: Secondary | ICD-10-CM | POA: Diagnosis not present

## 2024-01-05 DIAGNOSIS — I160 Hypertensive urgency: Secondary | ICD-10-CM | POA: Diagnosis not present

## 2024-01-05 DIAGNOSIS — E782 Mixed hyperlipidemia: Secondary | ICD-10-CM | POA: Diagnosis not present

## 2024-01-05 DIAGNOSIS — I4891 Unspecified atrial fibrillation: Secondary | ICD-10-CM | POA: Diagnosis not present

## 2024-01-05 DIAGNOSIS — I249 Acute ischemic heart disease, unspecified: Secondary | ICD-10-CM | POA: Diagnosis not present

## 2024-01-05 DIAGNOSIS — I350 Nonrheumatic aortic (valve) stenosis: Secondary | ICD-10-CM | POA: Diagnosis not present

## 2024-01-06 DIAGNOSIS — E782 Mixed hyperlipidemia: Secondary | ICD-10-CM | POA: Diagnosis not present

## 2024-01-06 DIAGNOSIS — I249 Acute ischemic heart disease, unspecified: Secondary | ICD-10-CM | POA: Diagnosis not present

## 2024-01-06 DIAGNOSIS — I350 Nonrheumatic aortic (valve) stenosis: Secondary | ICD-10-CM | POA: Diagnosis not present

## 2024-01-06 DIAGNOSIS — I160 Hypertensive urgency: Secondary | ICD-10-CM | POA: Diagnosis not present

## 2024-01-06 DIAGNOSIS — I4891 Unspecified atrial fibrillation: Secondary | ICD-10-CM | POA: Diagnosis not present

## 2024-01-06 DIAGNOSIS — I2510 Atherosclerotic heart disease of native coronary artery without angina pectoris: Secondary | ICD-10-CM | POA: Diagnosis not present

## 2024-01-07 DIAGNOSIS — E782 Mixed hyperlipidemia: Secondary | ICD-10-CM | POA: Diagnosis not present

## 2024-01-07 DIAGNOSIS — R0789 Other chest pain: Secondary | ICD-10-CM | POA: Diagnosis not present

## 2024-01-07 DIAGNOSIS — I350 Nonrheumatic aortic (valve) stenosis: Secondary | ICD-10-CM | POA: Diagnosis not present

## 2024-01-07 DIAGNOSIS — I160 Hypertensive urgency: Secondary | ICD-10-CM | POA: Diagnosis not present

## 2024-01-07 DIAGNOSIS — I4891 Unspecified atrial fibrillation: Secondary | ICD-10-CM | POA: Diagnosis not present

## 2024-01-07 DIAGNOSIS — G4733 Obstructive sleep apnea (adult) (pediatric): Secondary | ICD-10-CM | POA: Diagnosis not present

## 2024-01-08 ENCOUNTER — Ambulatory Visit: Admitting: Obstetrics and Gynecology

## 2024-01-14 ENCOUNTER — Ambulatory Visit (INDEPENDENT_AMBULATORY_CARE_PROVIDER_SITE_OTHER): Admitting: Family Medicine

## 2024-01-14 ENCOUNTER — Encounter: Payer: Self-pay | Admitting: Family Medicine

## 2024-01-14 ENCOUNTER — Ambulatory Visit: Admitting: Family Medicine

## 2024-01-14 VITALS — BP 135/89 | HR 86 | Ht 60.0 in | Wt 233.0 lb

## 2024-01-14 DIAGNOSIS — G4733 Obstructive sleep apnea (adult) (pediatric): Secondary | ICD-10-CM | POA: Diagnosis not present

## 2024-01-14 DIAGNOSIS — R7303 Prediabetes: Secondary | ICD-10-CM

## 2024-01-14 DIAGNOSIS — I5032 Chronic diastolic (congestive) heart failure: Secondary | ICD-10-CM

## 2024-01-14 DIAGNOSIS — I10 Essential (primary) hypertension: Secondary | ICD-10-CM | POA: Diagnosis not present

## 2024-01-14 DIAGNOSIS — I2510 Atherosclerotic heart disease of native coronary artery without angina pectoris: Secondary | ICD-10-CM | POA: Diagnosis not present

## 2024-01-14 DIAGNOSIS — Z6841 Body Mass Index (BMI) 40.0 and over, adult: Secondary | ICD-10-CM | POA: Diagnosis not present

## 2024-01-14 DIAGNOSIS — E66813 Obesity, class 3: Secondary | ICD-10-CM | POA: Diagnosis not present

## 2024-01-14 DIAGNOSIS — K760 Fatty (change of) liver, not elsewhere classified: Secondary | ICD-10-CM

## 2024-01-14 DIAGNOSIS — I130 Hypertensive heart and chronic kidney disease with heart failure and stage 1 through stage 4 chronic kidney disease, or unspecified chronic kidney disease: Secondary | ICD-10-CM

## 2024-01-14 NOTE — Progress Notes (Addendum)
 "    Diagnoses and Orders:   Kerry [name] has a PMH of HTN, HLD, dyspnea, CAD with coronary calcium  score 12/23.  She was noted to have a score of 1051.  She was started on statin and aspirin .  Cardiac PET/CT stress test 1/24 showed multiple perfusion defects.  She underwent cardiac catheterization 1/24 which showed multivessel coronary disease.  She underwent PCI with DES to her OM1 and PTCA to her circumflex and OM 2.  A platinum stent was used due to concern for stent allergy.  She was placed on dual antiplatelet therapy.   1. Hepatic steatosis   2. CAD, multiple vessel   3. OSA (obstructive sleep apnea)   4. Prediabetes   5. Chronic diastolic (congestive) heart failure (HCC)   6. Essential hypertension   7. Class 3 severe obesity with serious comorbidity and body mass index (BMI) of 45.0 to 49.9 in adult, unspecified obesity type [facility])    Assessment & Plan:   Assessment and Plan Assessment & Plan Obesity and weight management Chronic obesity with postpartum weight gain, exacerbated by menopause. High visceral fat percentage increases cardiovascular risk. Discussed Wegovy  and Zepbound benefits. Insurance coverage is a barrier, potential changes in January. Discussed microdosing and obtaining samples. - Attempt to obtain insurance coverage for Wegovy  or Zepbound starting January 1st. - Consider microdosing medications to reduce cost. - Contact drug representatives for samples to trial before purchasing. - Monitor weight and body composition regularly. - Encouraged dietary modifications and increased physical activity as tolerated.  Prediabetes Chronic prediabetes with A1c 5.9-6.0. Postprandial hypoglycemia contributes to weight gain and fatigue. Discussed high insulin levels' role in weight gain and benefits of weight loss medications. - Continue to monitor blood sugar levels regularly. - Encouraged dietary modifications to manage blood sugar levels.  Hypertensive heart  and chronic kidney disease with heart failure Chronic condition with well-managed kidney function. Blood pressure management complicated by obesity and sleep apnea. Current diuretic regimen may need adjustment. - Adjusted furosemide  to 20 mg daily instead of 40 mg every three days. - Monitor kidney function and blood pressure regularly. - Encouraged weight loss to improve blood pressure control.  Atrial fibrillation Recent hospitalization for atrial fibrillation with rapid ventricular rate. Normal stress test and cardiac scan. CPAP use crucial to prevent exacerbation. - Encouraged consistent use of CPAP to prevent atrial fibrillation exacerbation. - Will monitor cardiac status regularly.  Coronary artery disease Previous angioplasties and stents. Recent hospitalization ruled out additional myocardial vascular disease. Current management includes Repatha . Discussed weight loss medications' benefits in reducing cardiovascular risk. - Continue Repatha  for cholesterol management. - Encouraged weight loss to reduce cardiovascular risk.  Hepatic steatosis Chronic hepatic steatosis. Weight loss medications may improve liver function and reduce inflammation. - Encouraged weight loss to improve liver function.  Obstructive sleep apnea Chronic obstructive sleep apnea with inconsistent CPAP use. Contributes to hypertension and atrial fibrillation. Discussed importance of consistent CPAP use. - Encouraged consistent use of CPAP to manage sleep apnea. - Will monitor sleep quality and adjust CPAP settings as needed.  History of stroke Silent stroke noted on CT in 2023. Weight loss medications may reduce future cerebrovascular risk. - Encouraged weight loss to reduce stroke risk.  Geni Shutter, DO, MS, FAAFP, Dipl. [facility] [address] [place], [address] Dept: [phone] Dept Fax: [phone]  Subjective:   History of Present  Illness Kerry [name] is a 73 year old female who presents for weight management  consultation.  Weight gain and dietary habits - Long-standing weight gain attributed to high intake of sweets, post-pregnancy changes, and menopause - Significant weight gain began after second pregnancy and prednisone treatment - Chronic postprandial symptoms of near-syncope after eating, present for years - Multiple commercial weight-loss programs attempted with only temporary success - Dental problems impair chewing, requiring mashing or blending of foods  Glycemic control and prediabetes - Prediabetes with A1c around 5.9-6.0 - No recent hyperglycemia - Daily use of [place] cinnamon for approximately 20 years for perceived blood sugar benefit  Cardiovascular disease and arrhythmia - Atrial fibrillation with recent hospitalization from December 19 to 22, 2025 - History of angioplasties with stent placement - Recent labs show low HDL, likely genetic, and LDL well controlled at 57 - Intolerance to statins; currently on Repatha  - History of TIA based on imaging  Sleep disturbance and sleep apnea - Nonrestorative sleep with variable wake times - Uses CPAP for sleep apnea but struggles with consistent adherence  Review of Systems: Negative, with the exception of above mentioned in HPI.  History:   Reviewed by clinician on day of visit: allergies, medications, problem list, medical history, surgical history, family history, social history, and previous encounter notes.    Medications:   Show/hide medication list[1] Allergies[2]  Objective:   BP 135/89 (BP Location: Right Arm, Patient Position: Sitting, Cuff Size: Large)   Pulse 86   Ht 5' (1.524 m)   Wt 233 lb (105.7 kg)   LMP 08/16/1992   SpO2 95%   BMI 45.50 kg/m    Physical Exam Constitutional:      General: She is not in acute distress.    Appearance: She is well-developed.  HENT:     Head: Normocephalic and atraumatic.   Eyes:     Conjunctiva/sclera: Conjunctivae normal.  Cardiovascular:     Rate and Rhythm: Normal rate and regular rhythm.     Heart sounds: Normal heart sounds.  Pulmonary:     Effort: Pulmonary effort is normal.     Breath sounds: Normal breath sounds.  Neurological:     General: No focal deficit present.     Mental Status: She is alert.  Psychiatric:        Behavior: Behavior normal.    Attestations:   Patient is establishing care in this system with me as PCP. Available records reviewed. Chart updated today with reconciliation of problem list, medications, allergies, and relevant history. Preventive care and chronic disease status reviewed.  Outside labs reviewed and will be abstracted. Portions of historical chart may remain incomplete; will update on an ongoing basis as clinically indicated.  Discussed the use of AI scribe software for clinical note transcription with the patient, who gave verbal consent to proceed. As the patient's primary care physician, board-certified in Family Medicine and Obesity Medicine, I am providing ongoing, comprehensive obesity care based on the pillars of obesity medicine, including nutrition therapy, physical activity, behavioral modification, and pharmacologic treatment.  I personally spent a total of 45 minutes in the care of the patient today including preparing to see the patient, performing a medically appropriate exam/evaluation, counseling and educating, placing orders, and documenting clinical information in the EHR.     [1]  Outpatient Medications Prior to Visit  Medication Sig Note   amLODipine  (NORVASC ) 10 MG tablet Take 10 mg by mouth daily.    ascorbic acid (VITAMIN C ) 250 MG CHEW Chew 250 mg by mouth daily.    aspirin  EC 81 MG tablet Take  1 tablet (81 mg total) by mouth daily. Swallow whole.    BLACK CURRANT SEED OIL PO Take by mouth.    carboxymethylcellulose (REFRESH PLUS) 0.5 % SOLN Place 1 drop into both eyes 2 (two) times daily as  needed (for dry eyes).    Coenzyme Q10 200 MG capsule Take 200 mg by mouth daily after lunch.    cyanocobalamin  (VITAMIN B12) 1000 MCG tablet Take 1,000 mcg by mouth every Monday, Wednesday, and Friday.    cyanocobalamin  (VITAMIN B12) 1000 MCG/ML injection Inject 1,000 mcg into the muscle every 30 (thirty) days.    ELIQUIS 5 MG TABS tablet Take 5 mg by mouth 2 (two) times daily.    Evolocumab  (REPATHA  SURECLICK) 140 MG/ML SOAJ INJECT 140MG  INTO THE SKIN ONCE EVERY 14 DAYS    FIBER ADULT GUMMIES PO Take 2-3 each by mouth 2 (two) times daily as needed (constipation).    Hypertonic Nasal Wash (SINUS RINSE KIT NA) Place into the nose at bedtime.    Multiple Vitamins-Minerals (SUPER MULTI-VITAMIN PO) Take 1 capsule by mouth daily. Solgar Formula VM-75    nitroGLYCERIN  (NITROSTAT ) 0.4 MG SL tablet Place 1 tablet (0.4 mg total) under the tongue every 5 (five) minutes x 3 doses as needed for chest pain.    Omega-3 Fatty Acids (FISH OIL) 500 MG CAPS Take 500 mg by mouth daily. Taking 1,00mg  every other day    Probiotic Product (PROBIOTIC ADVANCED PO) Take 1 capsule by mouth daily.  Suprema Dophilus probiotic    TART CHERRY PO Take 2 tablets by mouth daily. Chewable    triamcinolone  cream (KENALOG) 0.1 % Apply 1 Application topically 2 (two) times daily as needed (eczema).    TURMERIC-GINGER PO Take 1 capsule by mouth 2 (two) times daily.    Vibegron  75 MG TABS Take 1 tablet (75 mg total) by mouth daily. Take 1 tablet (75 mg total) by mouth daily.    VITAMIN D -VITAMIN K PO Take 1 capsule by mouth every other day. Vit d 5000 units and 100mcg of K    [DISCONTINUED] amLODipine  (NORVASC ) 5 MG tablet Take 1 tablet (5 mg total) by mouth daily. 01/14/2024: Until patient sees Cardiology   [DISCONTINUED] ascorbic acid (VITAMIN C ) 500 MG tablet Take 250 mg by mouth daily.    [DISCONTINUED] furosemide  (LASIX ) 40 MG tablet Take 40 mg by mouth every 3rd day    [DISCONTINUED] losartan  (COZAAR ) 25 MG tablet Take 25 mg by  mouth daily.    [DISCONTINUED] metoprolol  succinate (TOPROL -XL) 25 MG 24 hr tablet Take 1 tablet (25 mg total) by mouth daily after lunch. (Patient taking differently: Take 25 mg by mouth in the morning and at bedtime.)    [DISCONTINUED] potassium chloride  SA (KLOR-CON  M) 20 MEQ tablet Take 1 tablet every 3 rd day (Patient taking differently: Take 20 mEq by mouth every 3 (three) days. Take 1 tablet every 3 rd day)    [DISCONTINUED] Sodium Chloride -Xylitol (XLEAR SINUS CARE SPRAY) SOLN Place 2 sprays into the nose at bedtime. Rinse only    No facility-administered medications prior to visit.  [2]  Allergies Allergen Reactions   Meloxicam  Other (See Comments)    Made patient have stomach pain ,sore throat, joint pains, face flush    Molds & Smuts Other (See Comments), Palpitations, Shortness Of Breath and Swelling   Cefixime Swelling   Celebrex [Celecoxib] Swelling   Cobalt Other (See Comments)    Per allergist   Gabapentin  Other (See Comments)    Joint pain  Levofloxacin Other (See Comments)    Muscle and joint pain   Molybdenum Other (See Comments)    Per allergist   Other Other (See Comments)    TEQUIN. TEQUIN - JOINT AND MUSCLE PAIN Other Reaction: painful joints Tantal Metal   Penicillins Hives   Robaxin [Methocarbamol]    Statins Other (See Comments)    Muscle pain   Tomato Hives   Adhesive [Tape] Rash    Sensitive to EKG leads; sensitive to 50M Micropore tape   Dust Mite Extract Itching, Palpitations and Other (See Comments)   Tetracycline Hcl Rash    Can take Doxy   Tetracyclines & Related Rash   "

## 2024-01-15 MED ORDER — POTASSIUM CHLORIDE CRYS ER 10 MEQ PO TBCR: 10.0000 meq | EXTENDED_RELEASE_TABLET | Freq: Every day | ORAL | 0 refills | Status: AC

## 2024-01-15 MED ORDER — FUROSEMIDE 20 MG PO TABS: ORAL_TABLET | ORAL | 1 refills | Status: DC

## 2024-01-16 DIAGNOSIS — I130 Hypertensive heart and chronic kidney disease with heart failure and stage 1 through stage 4 chronic kidney disease, or unspecified chronic kidney disease: Secondary | ICD-10-CM | POA: Insufficient documentation

## 2024-01-16 DIAGNOSIS — I5032 Chronic diastolic (congestive) heart failure: Secondary | ICD-10-CM | POA: Insufficient documentation

## 2024-01-16 DIAGNOSIS — K760 Fatty (change of) liver, not elsewhere classified: Secondary | ICD-10-CM | POA: Insufficient documentation

## 2024-01-16 DIAGNOSIS — R7303 Prediabetes: Secondary | ICD-10-CM | POA: Insufficient documentation

## 2024-01-22 ENCOUNTER — Ambulatory Visit: Payer: Self-pay | Admitting: Internal Medicine

## 2024-01-25 ENCOUNTER — Encounter: Payer: Self-pay | Admitting: Family Medicine

## 2024-01-25 ENCOUNTER — Ambulatory Visit: Payer: Self-pay | Admitting: Family Medicine

## 2024-01-25 ENCOUNTER — Ambulatory Visit: Admitting: Family Medicine

## 2024-01-25 VITALS — BP 126/64 | HR 79 | Ht 63.25 in | Wt 231.6 lb

## 2024-01-25 DIAGNOSIS — E66812 Obesity, class 2: Secondary | ICD-10-CM

## 2024-01-25 DIAGNOSIS — I2510 Atherosclerotic heart disease of native coronary artery without angina pectoris: Secondary | ICD-10-CM | POA: Diagnosis not present

## 2024-01-25 DIAGNOSIS — R262 Difficulty in walking, not elsewhere classified: Secondary | ICD-10-CM | POA: Diagnosis not present

## 2024-01-25 DIAGNOSIS — I130 Hypertensive heart and chronic kidney disease with heart failure and stage 1 through stage 4 chronic kidney disease, or unspecified chronic kidney disease: Secondary | ICD-10-CM

## 2024-01-25 DIAGNOSIS — G4733 Obstructive sleep apnea (adult) (pediatric): Secondary | ICD-10-CM

## 2024-01-25 DIAGNOSIS — Z79899 Other long term (current) drug therapy: Secondary | ICD-10-CM | POA: Diagnosis not present

## 2024-01-25 DIAGNOSIS — M25561 Pain in right knee: Secondary | ICD-10-CM

## 2024-01-25 DIAGNOSIS — Z975 Presence of (intrauterine) contraceptive device: Secondary | ICD-10-CM

## 2024-01-25 DIAGNOSIS — K760 Fatty (change of) liver, not elsewhere classified: Secondary | ICD-10-CM

## 2024-01-25 DIAGNOSIS — I350 Nonrheumatic aortic (valve) stenosis: Secondary | ICD-10-CM | POA: Diagnosis not present

## 2024-01-25 DIAGNOSIS — I10 Essential (primary) hypertension: Secondary | ICD-10-CM | POA: Diagnosis not present

## 2024-01-25 DIAGNOSIS — Z6841 Body Mass Index (BMI) 40.0 and over, adult: Secondary | ICD-10-CM

## 2024-01-25 DIAGNOSIS — G8929 Other chronic pain: Secondary | ICD-10-CM

## 2024-01-25 DIAGNOSIS — Z6839 Body mass index (BMI) 39.0-39.9, adult: Secondary | ICD-10-CM

## 2024-01-25 LAB — COMPREHENSIVE METABOLIC PANEL WITH GFR
ALT: 11 U/L (ref 3–35)
AST: 13 U/L (ref 5–37)
Albumin: 4.5 g/dL (ref 3.5–5.2)
Alkaline Phosphatase: 47 U/L (ref 39–117)
BUN: 17 mg/dL (ref 6–23)
CO2: 28 meq/L (ref 19–32)
Calcium: 9.6 mg/dL (ref 8.4–10.5)
Chloride: 104 meq/L (ref 96–112)
Creatinine, Ser: 0.63 mg/dL (ref 0.40–1.20)
GFR: 86.87 mL/min
Glucose, Bld: 100 mg/dL — ABNORMAL HIGH (ref 70–99)
Potassium: 3.8 meq/L (ref 3.5–5.1)
Sodium: 141 meq/L (ref 135–145)
Total Bilirubin: 0.4 mg/dL (ref 0.2–1.2)
Total Protein: 7.4 g/dL (ref 6.0–8.3)

## 2024-01-25 LAB — CBC WITH DIFFERENTIAL/PLATELET
Basophils Absolute: 0 K/uL (ref 0.0–0.1)
Basophils Relative: 0.3 % (ref 0.0–3.0)
Eosinophils Absolute: 0.3 K/uL (ref 0.0–0.7)
Eosinophils Relative: 3.6 % (ref 0.0–5.0)
HCT: 44.2 % (ref 36.0–46.0)
Hemoglobin: 15 g/dL (ref 12.0–15.0)
Lymphocytes Relative: 24.5 % (ref 12.0–46.0)
Lymphs Abs: 1.9 K/uL (ref 0.7–4.0)
MCHC: 33.8 g/dL (ref 30.0–36.0)
MCV: 89.6 fl (ref 78.0–100.0)
Monocytes Absolute: 0.7 K/uL (ref 0.1–1.0)
Monocytes Relative: 9.4 % (ref 3.0–12.0)
Neutro Abs: 4.9 K/uL (ref 1.4–7.7)
Neutrophils Relative %: 62.2 % (ref 43.0–77.0)
Platelets: 231 K/uL (ref 150.0–400.0)
RBC: 4.94 Mil/uL (ref 3.87–5.11)
RDW: 13.8 % (ref 11.5–15.5)
WBC: 7.9 K/uL (ref 4.0–10.5)

## 2024-01-25 LAB — MAGNESIUM: Magnesium: 2.3 mg/dL (ref 1.5–2.5)

## 2024-01-25 LAB — PHOSPHORUS: Phosphorus: 3.7 mg/dL (ref 2.3–4.6)

## 2024-01-25 LAB — TSH: TSH: 2.6 u[IU]/mL (ref 0.35–5.50)

## 2024-01-25 LAB — T4, FREE: Free T4: 1.1 ng/dL (ref 0.60–1.60)

## 2024-01-25 MED ORDER — LOSARTAN POTASSIUM 25 MG PO TABS: 25.0000 mg | ORAL_TABLET | Freq: Every day | ORAL | 1 refills | Status: AC

## 2024-01-25 MED ORDER — TORSEMIDE 20 MG PO TABS: 20.0000 mg | ORAL_TABLET | Freq: Every day | ORAL | 1 refills | Status: AC

## 2024-01-25 MED ORDER — METOPROLOL SUCCINATE ER 25 MG PO TB24: 25.0000 mg | ORAL_TABLET | Freq: Two times a day (BID) | ORAL | 1 refills | Status: AC

## 2024-01-25 NOTE — Patient Instructions (Signed)
 SABRA

## 2024-01-25 NOTE — Progress Notes (Unsigned)
 Cardiac pharmacist Cardiac reshab Knee  Water  aerobics Iud in place - bx of 2/6 Torsemide  20    Patient Care Team: Prentiss Frieze, DO as PCP - General (Family Medicine) Okey Vina GAILS, MD as PCP - Cardiology (Cardiology) Tobie Tonita POUR, DO as Consulting Physician (Neurology) Emelia Josefa HERO, NP as Nurse Practitioner (Cardiology)  Weight Management   Starting weight: *** Starting date: *** Today's weight: *** Today's date: 01/25/2024 Total lbs lost to date: *** Total lbs lost since last in-office visit: *** Total weight loss percentage to date: -***% Body mass index is 39.44 kg/m.   Nutrition Plan: ***. Anti-obesity medications (including off-label): ***. Reported side effects: ***. Hunger is {EWCONTROLASSESSMENT:24261}. Cravings are {EWCONTROLASSESSMENT:24261}. Activity: *** Sleep: Number of hours slept each night: ***. Sleep {ACTION; IS/IS NOT:21021397} restful.   Subjective   History of Present Illness Kerry [name] is a 74 year old female with atrial fibrillation and hypertension who presents for follow-up after a recent hospitalization.  Fatigue and sleep disturbance - Persistent fatigue since November, worsened after four-day hospitalization with poor sleep - Feels very sleepy when sitting, attributes this to medications and disrupted sleep - Resumed more consistent CPAP use, though routine was disrupted after hospitalization  Peripheral edema - Increased fluid retention since hospitalization - Tight, shiny ankles and calves - Lasix  changed to daily dosing, resulting in improvement of swelling - Intermittent worsening of edema, especially when tired or stressed  Atrial fibrillation and medication management - Recent hospitalization for atrial fibrillation with rapid ventricular rate - Normal stress test during hospitalization - Current regimen includes increased-dose metoprolol  and Eliquis twice daily - Concern about needing to eat with medications  due to prior gastrointestinal problems  Musculoskeletal limitations - Knee problems limit ability to participate fully in cardiac rehabilitation and use some exercise equipment  Nutrition and weight management - Focused on nutrition and weight management - Avoids gluten and nightshades due to prior reactions - Tracks dietary intake and limits calories - Eats avocado daily as part of weight-control plan  Review of Systems: Negative, with the exception of above mentioned in HPI.  History   Reviewed by clinician on day of visit: allergies, medications, problem list, medical history, surgical history, family history, social history, and previous encounter notes.  Medications   Show/hide medication list[1] Allergies[2]  Objective   BP 126/64 (BP Location: Right Arm, Cuff Size: Large)   Pulse 79   Ht 5' 3.25 (1.607 m)   Wt 224 lb 6.4 oz (101.8 kg)   LMP 08/16/1992   SpO2 97%   BMI 39.44 kg/m  {Insert last BP/Wt (optional):23777}{See vitals history (optional):1}    Physical Exam  {Insert previous labs (optional):23779} {See past labs  Heme  Chem  Endocrine  Serology  Results Review (optional):1}  Results for orders placed or performed in visit on 01/24/24  Lab report - scanned   Collection Time: 12/10/23 12:00 AM  Result Value Ref Range   A1c 5.9    EGFR     *Note: Due to a large number of results and/or encounters for the requested time period, some results have not been displayed. A complete set of results can be found in Results Review.     Screening   01/25/2024    PHQ2-9 Depression Screening   Little interest or pleasure in doing things    Feeling down, depressed, or hopeless    PHQ-2 - Total Score    Trouble falling or staying asleep, or sleeping too much    Feeling  tired or having little energy    Poor appetite or overeating     Feeling bad about yourself - or that you are a failure or have let yourself or your family down    Trouble concentrating on  things, such as reading the newspaper or watching television    Moving or speaking so slowly that other people could have noticed.  Or the opposite - being so fidgety or restless that you have been moving around a lot more than usual    Thoughts that you would be better off dead, or hurting yourself in some way    PHQ2-9 Total Score    If you checked off any problems, how difficult have these problems made it for you to do your work, take care of things at home, or get along with other people    Depression Interventions/Treatment         01/14/2024    3:07 PM  GAD 7 : Generalized Anxiety Score  Nervous, Anxious, on Edge 0  Control/stop worrying 1  Worry too much - different things 1  Trouble relaxing 0  Restless 0  Easily annoyed or irritable 1  Afraid - awful might happen 0  Total GAD 7 Score 3  Anxiety Difficulty Somewhat difficult   Diagnoses and Orders   1. Hypertensive heart and chronic kidney disease with heart failure and stage 1 through stage 4 chronic kidney disease, or unspecified chronic kidney disease (HCC)   2. Medication management    Meds ordered this encounter  Medications   metoprolol  succinate (TOPROL -XL) 25 MG 24 hr tablet    Sig: Take 1 tablet (25 mg total) by mouth in the morning and at bedtime.    Dispense:  180 tablet    Refill:  1   losartan  (COZAAR ) 25 MG tablet    Sig: Take 1 tablet (25 mg total) by mouth daily.    Dispense:  90 tablet    Refill:  1   torsemide  (DEMADEX ) 20 MG tablet    Sig: Take 1 tablet (20 mg total) by mouth daily.    Dispense:  30 tablet    Refill:  1   Orders Placed This Encounter  Procedures   Comprehensive metabolic panel with GFR   CBC with Differential/Platelet   TSH   T4, free   Magnesium   Phosphorus    Assessment and Plan  Assessment and Plan Assessment & Plan Hypertensive heart and chronic kidney disease with heart failure Persistent fluid retention likely due to increased diuretic use. Blood pressure  controlled. Weight decreased by 9 pounds, indicating fluid loss. Swelling persists but shows improvement. - Increase Lasix  to 40 mg daily for one week, then reduce to 20 mg daily. - Switch to torsemide  20 mg daily for better diuresis and reduced urinary frequency. - Order labs to check kidney function and electrolytes.  Atrial fibrillation Recent hospitalization for AFib with rapid ventricular rate. Metoprolol  increased for rate control. Eliquis for anticoagulation. Awaiting cardiology plan post-hospitalization. - Continue metoprolol  twice daily for rate control. - Continue Eliquis 5 mg twice daily for anticoagulation. - Follow up with cardiology on January 20th for medication management plan.  Coronary artery disease, multiple vessel Recent stress test normal. - Continue current cardiac medications as prescribed.  Class 3 severe obesity with serious comorbidity Weight management is a priority. Current diet healthy but high in calories. Discussed weight management injections for weight loss and heart health improvement. - Reduce caloric intake to approximately 1200 calories per  day. - Explore weight management injections for weight loss.  Obstructive sleep apnea Increased CPAP use noted, but adherence inconsistent. Discussed importance of CPAP during initial sleep onset. - Encourage consistent CPAP use during initial sleep onset.  General health maintenance Discussed dietary habits and potential changes to reduce caloric intake. Emphasized monitoring caloric intake and healthier food choices. - Consider using apps like My Fitness Pal or Lose It to track caloric intake. - Encourage continued use of unsalted butter and mindful salt intake. - Discuss potential dietary changes, such as substituting butter and honey with healthier options like jam or avocado.     Geni Shutter, DO, MS, FAAFP, Dipl. [facility] [address] [place],  [address] Dept: [phone] Dept Fax: [phone]  Attestations   {EWALLATTESTATIONS:34360}     [1]  Outpatient Medications Prior to Visit  Medication Sig   amLODipine  (NORVASC ) 10 MG tablet Take 10 mg by mouth daily.   ascorbic acid (VITAMIN C ) 250 MG CHEW Chew 250 mg by mouth daily.   aspirin  EC 81 MG tablet Take 1 tablet (81 mg total) by mouth daily. Swallow whole.   BLACK CURRANT SEED OIL PO Take by mouth.   carboxymethylcellulose (REFRESH PLUS) 0.5 % SOLN Place 1 drop into both eyes 2 (two) times daily as needed (for dry eyes).   Coenzyme Q10 200 MG capsule Take 200 mg by mouth daily after lunch.   cyanocobalamin  (VITAMIN B12) 1000 MCG tablet Take 1,000 mcg by mouth every Monday, Wednesday, and Friday.   cyanocobalamin  (VITAMIN B12) 1000 MCG/ML injection Inject 1,000 mcg into the muscle every 30 (thirty) days.   ELIQUIS 5 MG TABS tablet Take 5 mg by mouth 2 (two) times daily.   Evolocumab  (REPATHA  SURECLICK) 140 MG/ML SOAJ INJECT 140MG  INTO THE SKIN ONCE EVERY 14 DAYS   FIBER ADULT GUMMIES PO Take 2-3 each by mouth 2 (two) times daily as needed (constipation).   Hypertonic Nasal Wash (SINUS RINSE KIT NA) Place into the nose at bedtime.   Multiple Vitamins-Minerals (SUPER MULTI-VITAMIN PO) Take 1 capsule by mouth daily. Solgar Formula VM-75   nitroGLYCERIN  (NITROSTAT ) 0.4 MG SL tablet Place 1 tablet (0.4 mg total) under the tongue every 5 (five) minutes x 3 doses as needed for chest pain.   Omega-3 Fatty Acids (FISH OIL) 500 MG CAPS Take 500 mg by mouth daily. Taking 1,00mg  every other day   potassium chloride  (KLOR-CON  M) 10 MEQ tablet Take 1 tablet (10 mEq total) by mouth daily.   Probiotic Product (PROBIOTIC ADVANCED PO) Take 1 capsule by mouth daily.  Suprema Dophilus probiotic   TART CHERRY PO Take 2 tablets by mouth daily. Chewable   triamcinolone  cream (KENALOG) 0.1 % Apply 1 Application topically 2 (two) times daily as needed (eczema).   TURMERIC-GINGER PO Take 1 capsule by  mouth 2 (two) times daily.   Vibegron  75 MG TABS Take 1 tablet (75 mg total) by mouth daily. Take 1 tablet (75 mg total) by mouth daily.   VITAMIN D -VITAMIN K PO Take 1 capsule by mouth every other day. Vit d 5000 units and 100mcg of K   [DISCONTINUED] furosemide  (LASIX ) 20 MG tablet Take 20 mg by mouth every day   [DISCONTINUED] losartan  (COZAAR ) 25 MG tablet Take 25 mg by mouth daily.   [DISCONTINUED] metoprolol  succinate (TOPROL -XL) 25 MG 24 hr tablet Take 1 tablet (25 mg total) by mouth daily after lunch. (Patient taking differently: Take 25 mg by mouth in the  morning and at bedtime.)   No facility-administered medications prior to visit.  [2]  Allergies Allergen Reactions   Meloxicam  Other (See Comments)    Made patient have stomach pain ,sore throat, joint pains, face flush    Molds & Smuts Other (See Comments), Palpitations, Shortness Of Breath and Swelling   Cefixime Swelling   Celebrex [Celecoxib] Swelling   Cobalt Other (See Comments)    Per allergist   Gabapentin  Other (See Comments)    Joint pain   Levofloxacin Other (See Comments)    Muscle and joint pain   Molybdenum Other (See Comments)    Per allergist   Other Other (See Comments)    TEQUIN. TEQUIN - JOINT AND MUSCLE PAIN Other Reaction: painful joints Tantal Metal   Penicillins Hives   Robaxin [Methocarbamol]    Statins Other (See Comments)    Muscle pain   Tomato Hives   Adhesive [Tape] Rash    Sensitive to EKG leads; sensitive to 62M Micropore tape   Dust Mite Extract Itching, Palpitations and Other (See Comments)   Tetracycline Hcl Rash    Can take Doxy   Tetracyclines & Related Rash

## 2024-01-28 ENCOUNTER — Encounter: Payer: Self-pay | Admitting: Family Medicine

## 2024-01-28 ENCOUNTER — Encounter: Payer: Self-pay | Admitting: *Deleted

## 2024-01-28 DIAGNOSIS — Z975 Presence of (intrauterine) contraceptive device: Secondary | ICD-10-CM | POA: Insufficient documentation

## 2024-01-28 DIAGNOSIS — I350 Nonrheumatic aortic (valve) stenosis: Secondary | ICD-10-CM | POA: Insufficient documentation

## 2024-01-28 DIAGNOSIS — R262 Difficulty in walking, not elsewhere classified: Secondary | ICD-10-CM | POA: Insufficient documentation

## 2024-01-28 LAB — OPHTHALMOLOGY REPORT-SCANNED

## 2024-01-28 MED ORDER — WEGOVY 0.25 MG/0.5ML ~~LOC~~ SOAJ: 0.2500 mg | SUBCUTANEOUS | 1 refills | Status: DC

## 2024-01-30 MED ORDER — SEMAGLUTIDE-WEIGHT MANAGEMENT 1.5 MG PO TABS: 1.5000 mg | ORAL_TABLET | Freq: Every day | ORAL | 0 refills | Status: AC

## 2024-01-30 NOTE — Telephone Encounter (Signed)
 I called ans spoke with Kerry [name] for 30 minutes regarding her My Chart message.  - I let her know that the height and weight that were entered into her last OV note have been corrected. I still had the sheet that they were written on. I apologized to pt and assured her that all her vitals were now correct.  - I talked with her regarding Wegovy  and gave her the information on the self pay prices for both the pill and the injection. Pt has decided to go with the pill form and would like for it to be sent through [facility], [facility]. Dr Prentiss has given her verbal approval for this change.  - Pt had several questions regarding her PT referral. First she wanted to kno wif she had to make the appt right away. Pt thinks that she may wait a few months before scheduling. I told her that was perfectly fine. She can schedule whenever suits her, but that we may have to resubmit the referral later on, if it is closed and she doesn't schedule. She was fine with that. She also wanted to know if the location that the referral was sent to is the closest location to her home in [place] that offers PT. Let her know that I was not that familiar with the referral process and did not know what offices were preferred, but that I could send a message to the referral team and see what they say. - The last part of the message indicated that she was not scheduled with Dr Prentiss for her 6 week  follow up, but instead was scheduled with Kerry [name]. Dr Prentiss prefers to see her herself, so I have rescheduled her to the 16th at 845. Pt notified.

## 2024-01-31 ENCOUNTER — Encounter: Payer: Self-pay | Admitting: Internal Medicine

## 2024-02-01 ENCOUNTER — Other Ambulatory Visit (HOSPITAL_COMMUNITY): Payer: Self-pay

## 2024-02-01 ENCOUNTER — Other Ambulatory Visit: Payer: Self-pay | Admitting: Pharmacist

## 2024-02-01 ENCOUNTER — Telehealth: Payer: Self-pay | Admitting: Pharmacy Technician

## 2024-02-01 ENCOUNTER — Telehealth: Payer: Self-pay

## 2024-02-01 DIAGNOSIS — E785 Hyperlipidemia, unspecified: Secondary | ICD-10-CM

## 2024-02-01 DIAGNOSIS — I2510 Atherosclerotic heart disease of native coronary artery without angina pectoris: Secondary | ICD-10-CM

## 2024-02-01 MED ORDER — REPATHA SURECLICK 140 MG/ML ~~LOC~~ SOAJ: 140.0000 mg | SUBCUTANEOUS | 1 refills | Status: AC

## 2024-02-01 NOTE — Telephone Encounter (Signed)
 OK to refill meds    I will see next week at appt

## 2024-02-01 NOTE — Telephone Encounter (Signed)
 Pharmacy Patient Advocate Encounter   Received notification from [facility] that prior authorization for REPATHA  is required/requested.   Insurance verification completed.   The patient is insured through [facility].   Per test claim: PA required; PA submitted to above mentioned insurance via Latent Key/confirmation #/EOC [id number] Status is pending

## 2024-02-01 NOTE — Progress Notes (Unsigned)
 Complex Care Management Note Care Guide Note  02/01/2024 Name: Kerry [name] MRN: [id number] DOB: November 22, 1950   Complex Care Management Outreach Attempts: An unsuccessful telephone outreach was attempted today to offer the patient information about available complex care management services.  Follow Up Plan:  Additional outreach attempts will be made to offer the patient complex care management information and services.   Encounter Outcome:  No Answer  Dreama Lynwood [facility]  [facility], [facility] VBCI Assistant Direct Dial: [phone]  Fax: [phone]

## 2024-02-03 NOTE — Progress Notes (Unsigned)
 " Cardiology Office Note:    Date:  02/03/2024   ID:  Kerry [name], DOB [birth year], MRN [id number]  PCP:  Prentiss Frieze, DO  Cardiologist:  Vina Gull, MD    Pt presents for follow up of CAD   History of Present Illness:    Kerry [name] is a 74 y.o. female with hx of dyspnea, HTN, CAD   Calcium  score CT done Dec 2023   Ca score was 1051   Started on statin/ASA.   PET/CT stress test in 01/2022 showed  multiple perfusion defects   LHC in Jan 2024 showed multivessel CAD    She underwent PCI/DES to OM1 and PTCA to LC/OM2.  Platinum stent used due to concern of stent allergy She was placed on ASA and Plavix  for 6 months Note LVEDP at time of cath was 32 mm Hg  The pt was seen by FORBES Alberts in later Jan 2024  Still had some SOB post procedure      I saw the pt in March 2024 Complained of SOB and fatigue     I saw the pt in clinic in Summer 2024 Fall 2024  Stopped statin  Felt better  She was just seen by CHRISTELLA Crews.  Working on approval for Repatha   Since seen the pt continues to complain of dyspnea on exertion. Denies CP Denies PND   Stable LE edema   I saw the pt in clinic in Dec 2024   She has been seen by JINNY Beauvais since, last in   Past Medical History:  Diagnosis Date   Abnormal uterine bleeding    Allergic contact dermatitis due to metals    Allergic rhinitis, cause unspecified    Bursitis of left shoulder    Chronic pain syndrome    Complication of anesthesia    03-20-2023  pt stated has issues with memory post op   Coronary artery disease 01/2022   cardiologist--- dr myrtis gull;   positive PET NUC 01-24-2022;  cath 01-25-2022/  01-26-2022  multivessel CAD w/ severe global LV contractibility ef 55-60% LVEDP 30mm;    s/p  PTCA balloon mCX and OM2 and DES x1 to OM1   Dyspareunia    Endometrial mass    Family history of ovarian cancer    Full dentures    GERD (gastroesophageal reflux disease)    improved   History of adenomatous polyp of colon    History of  cerebral infarction    neurologist--- dr marla. patel (lov note in epic 12-21-2021 for vertigo & HA)  MRI imaging incidental finding remote small vessal infarct right basal ganglia   History of esophageal stricture    s/p  EGD w/ dilatation 2010 and 03-12-2018   History of kidney stones    HTN (hypertension)    Hyperlipidemia    Interstitial lung disease (HCC)    followed by dr shellia ([facility] [place] [place])   long COVID related mild ILD with UIP pattern   Multiple pulmonary nodules    Neuromuscular disorder (HCC)    OA (osteoarthritis)    knees;  hands   OAB (overactive bladder)    OSA on CPAP    followed by pulmonology--- dr shellia;   HST in epic 03-04-2015  moderate osa   Polyarthralgia    Pre-diabetes    S/P drug eluting coronary stent placement 01/26/2022   PTCA balloon angioplasty and DES x1 toOM1   Somatic dysfunction    multiple sites;    lumbar &  sacral regions;  ribs;   cervical spine   Stroke (HCC) 11/2022   dx TIA   Urinary incontinence    Uterine fibroid     Past Surgical History:  Procedure Laterality Date   CARPAL TUNNEL RELEASE Bilateral    left 05-21-2020;   right  1998   CATARACT EXTRACTION W/ INTRAOCULAR LENS IMPLANT Bilateral 2018   COLONOSCOPY WITH ESOPHAGOGASTRODUODENOSCOPY (EGD)  2023   [facility] [place]   COLONOSCOPY WITH ESOPHAGOGASTRODUODENOSCOPY (EGD) AND ESOPHAGEAL DILATION (ED)  03/12/2018   @ [facility]   CORONARY STENT INTERVENTION N/A 01/26/2022   Procedure: CORONARY STENT INTERVENTION;  Surgeon: Verlin Lonni BIRCH, MD;  Location: [facility];  Service: Cardiovascular;  Laterality: N/A;   DILATATION & CURETTAGE/HYSTEROSCOPY WITH MYOSURE N/A 08/22/2021   Procedure: DILATATION & CURETTAGE/HYSTEROSCOPY WITH MYOSURE RESECTION OF ENDOMETRIAL MASS;  Surgeon: Cathlyn JAYSON Nikki Bobie FORBES, MD;  Location: [facility];  Service: Gynecology;  Laterality: N/A;   DILATATION & CURETTAGE/HYSTEROSCOPY WITH MYOSURE N/A  03/21/2023   Procedure: DILATATION & CURETTAGE/HYSTEROSCOPY WITH MYOSURE;  Surgeon: Dallie Vera GAILS, MD;  Location: [facility];  Service: Gynecology;  Laterality: N/A;  [facility]   DILATION AND CURETTAGE OF UTERUS  2000   HAMMER TOE SURGERY Right 1990   HYSTEROSCOPY WITH D & C N/A 04/25/2023   Procedure: DILATATION AND CURETTAGE /HYSTEROSCOPY;  Surgeon: Viktoria Comer SAUNDERS, MD;  Location: [facility];  Service: Gynecology;  Laterality: N/A;  MYOSURE   INTRAUTERINE DEVICE (IUD) INSERTION N/A 04/25/2023   Procedure: INSERTION, INTRAUTERINE DEVICE;  Surgeon: Viktoria Comer SAUNDERS, MD;  Location: [facility];  Service: Gynecology;  Laterality: N/A;   LEFT HEART CATH AND CORONARY ANGIOGRAPHY N/A 01/25/2022   Procedure: LEFT HEART CATH AND CORONARY ANGIOGRAPHY;  Surgeon: Burnard Debby LABOR, MD;  Location: [facility];  Service: Cardiovascular;  Laterality: N/A;   NASAL SEPTUM SURGERY  2000   OPERATIVE ULTRASOUND N/A 08/22/2021   Procedure: OPERATIVE ULTRASOUND GUIDED HYSTEROSCOPY;  Surgeon: Cathlyn JAYSON Nikki Bobie FORBES, MD;  Location: [facility];  Service: Gynecology;  Laterality: N/A;   PELVIC LAPAROSCOPY  1990   remembers pelvic pain, normal findings   ROTATOR CUFF REPAIR Left 11/11/2013   @ [facility]  by  Dr. Dozier   SHOULDER ARTHROSCOPY W/ ROTATOR CUFF REPAIR Right 10/09/2011   @[facility]  by Dr. Dozier   UMBILICAL HERNIA REPAIR  2010   Patient reports mesh was used for the repair.    Current Medications: No outpatient medications have been marked as taking for the 02/05/24 encounter (Appointment) with Okey Vina GAILS, MD.     Allergies:   Meloxicam , Molds & smuts, Cefixime, Celebrex [celecoxib], Cobalt, Gabapentin , Levofloxacin, Molybdenum, Other, Penicillins, Robaxin [methocarbamol], Statins, Tomato, Adhesive [tape], Dust mite extract, Tetracycline hcl, and Tetracyclines & related   Social History   Socioeconomic History   Marital status: Married    Spouse name: Not on file   Number of children: Not  on file   Years of education: Not on file   Highest education level: Master's degree (e.g., MA, MS, MEng, MEd, MSW, MBA)  Occupational History   Occupation: Retired  Tobacco Use   Smoking status: Never   Smokeless tobacco: Never  Vaping Use   Vaping status: Never Used  Substance and Sexual Activity   Alcohol  use: No    Alcohol /week: 0.0 standard drinks of alcohol    Drug use: Never   Sexual activity: Not Currently    Partners: Male    Birth control/protection:  Post-menopausal  Other Topics Concern   Not on file  Social History Narrative   Are you right handed or left handed? Right handed   Are you currently employed ? No retired   What is your current occupation?   Do you live at home alone? No   Who lives with you? Lives with husband.    What type of home do you live in: 1 story or 2 story? Two story home.       Social Drivers of Health   Tobacco Use: Low Risk (01/28/2024)   Patient History    Smoking Tobacco Use: Never    Smokeless Tobacco Use: Never    Passive Exposure: Not on file  Financial Resource Strain: Low Risk (01/13/2024)   Overall Financial Resource Strain (CARDIA)    Difficulty of Paying Living Expenses: Not very hard  Food Insecurity: No Food Insecurity (01/13/2024)   Epic    Worried About Programme Researcher, Broadcasting/film/video in the Last Year: Never true    Ran Out of Food in the Last Year: Never true  Transportation Needs: Patient Declined (01/13/2024)   Epic    Lack of Transportation (Medical): Patient declined    Lack of Transportation (Non-Medical): Patient declined  Physical Activity: Unknown (01/13/2024)   Exercise Vital Sign    Days of Exercise per Week: Patient declined    Minutes of Exercise per Session: Not on file  Stress: Patient Declined (01/13/2024)   [employer] of Occupational Health - Occupational Stress Questionnaire    Feeling of Stress: Patient declined  Social Connections: Unknown (01/13/2024)   Social Connection and Isolation Panel     Frequency of Communication with Friends and Family: Patient declined    Frequency of Social Gatherings with Friends and Family: Patient declined    Attends Religious Services: Never    Database Administrator or Organizations: Patient declined    Attends Banker Meetings: Not on file    Marital Status: Married  Depression (PHQ2-9): Medium Risk (01/14/2024)   Depression (PHQ2-9)    PHQ-2 Score: 6  Alcohol  Screen: Not on file  Housing: Low Risk (01/13/2024)   Epic    Unable to Pay for Housing in the Last Year: No    Number of Times Moved in the Last Year: 0    Homeless in the Last Year: No  Utilities: Low Risk (06/27/2023)   Received from [facility]   Utilities    In the past 12 months has the electric, gas, oil, or water  company threatened to shut off services in your home? : No  Health Literacy: Not on file     Family History: The patient's family history includes Arthritis in her sister; Basal cell carcinoma in her father; Cancer (age of onset: 28) in her niece; Crohn's disease in her brother; Diabetes in her mother; Glaucoma in her brother; Heart attack in her father; Kidney failure in her brother; Ovarian cancer (age of onset: 70) in her sister; Rheumatologic disease in her mother and sister; Ulcerative colitis in her brother. There is no history of Breast cancer, Prostate cancer, Colon cancer, Pancreatic cancer, or Endometrial cancer. ROS:   Please see the history of present illness.    All 14 point review of systems negative except as described per history of present illness  EKGs/Labs/Other Studies Reviewed:    EKG   Not done today   Recent Labs: 01/25/2024: ALT 11; BUN 17; Creatinine, Ser 0.63; Hemoglobin 15.0; Magnesium 2.3; Platelets 231.0; Potassium 3.8; Sodium  141; TSH 2.60  Recent Lipid Panel    Component Value Date/Time   CHOL 134 03/12/2023 1049   TRIG 153 (H) 03/12/2023 1049   HDL 50 03/12/2023 1049   CHOLHDL 2.7 03/12/2023 1049   CHOLHDL 5.6  02/09/2011 1027   VLDL 27 02/09/2011 1027   LDLCALC 58 03/12/2023 1049    Physical Exam:    VS:  BP 142/84  P 98  Sats RA 98%    Wt 240 lb      Wt Readings from Last 3 Encounters:  01/25/24 231 lb 9.6 oz (105.1 kg)  01/14/24 233 lb (105.7 kg)  12/06/23 234 lb (106.1 kg)     GEN: Morbidly obese 74 yo in no acute distress  Examined in chair NECK: No obvious JVD; No carotid bruit CARDIAC: RRR, no murmur Distant  RESPIRATORY:  Clear to auscultation  No wheezes or rates  ABDOMEN:  OBese  MUSCULOSKELETAL:  Tr  LE  edema  Stent intervention 01/26/22    Mid Cx lesion is 80% stenosed.   Prox Cx lesion is 20% stenosed.   2nd Diag lesion is 60% stenosed.   3rd Mrg lesion is 90% stenosed.   A drug-eluting stent was successfully placed using a SYNERGY XD 2.25X20.   Balloon angioplasty was performed using a BALLN SAPPHIRE 2.0X12.   Post intervention, there is a 0% residual stenosis.   Post intervention, there is a 60% residual stenosis.   Severe stenosis in the mid Circumflex affecting flow into the first obtuse marginal branch and the second obtuse marginal branch.  Successful PTCA/DES x 1 OM1 (Synergy stent platform as felt to be the most favorable given her metal allergy profile) Successful balloon angioplasty mid Circumflex/OM2   Recommendations: DAPT with ASA and Plavix  for at least six months.    L heart cath  Jan 2024      2nd Diag lesion is 60% stenosed.   Mid Cx lesion is 80% stenosed.   3rd Mrg lesion is 90% stenosed.   Prox Cx lesion is 20% stenosed.   Prox RCA lesion is 40% stenosed.   Mid RCA lesion is 10% stenosed.   Dist RCA lesion is 20% stenosed.   RPDA-1 lesion is 30% stenosed.   RPDA-2 lesion is 70% stenosed.   LV end diastolic pressure is severely elevated.   Multivessel CAD with 60% ostial narrowing of the second diagonal branch of the LAD; 20% proximal circumflex stenosis with mid circumflex bifurcation stenosis of 80 and 90%; dominant RCA with 40% proximal  stenosis, mild 10 and 20% mid stenoses, and 30 and 70% mid and distal PDA stenosis.   Serve global LV contractility with EF estimated 55 to 60%.  Elevated LVEDP at 30 mm.   Significant blood pressure elevation for which the patient received hydralazine  10 mg x 2 post procedure.   RECOMMENDATION: Patient most likely will need PCI for her bifurcation circumflex stenosis.  She was very concerned about potential metal allergies.  We reviewed the stents with her and her concern with the molybdenum which is present in both the Synergy and Medtronic stents.  The Synergy stent also has platinum chromium in the Resolute stent platinum iridium in addition to cobalt alloy inner core.  Will discuss with his representatives.  Plan intervention tomorrow once further discussion obtained with patient family and device reps.    Impression     1  Dyspnea  Pt continues to have dyspnea.   Review of cath her LVEDP as significantly elevated (32)  Her weight today is up even higher today than it was earlier this yeare   Exam difficult given pt's size.  She may do well with with lasix  40 (with 20 KCL)   every other day  I would recomm starting Check BMET and BNP in 1 week      2  CAD  Pt s/p PTCA/STent and PCI in Jan 2024    Follow     3  HTN  BP is a little high  Follow with lasix    4   HL  Intolerant to statins   Working on approval for Repatha   5    OSA   Continue CPAP  Encouraged her to get active in the pool  Medication Adjustments/Labs and Tests Ordered: Current medicines are reviewed at length with the patient today.  Concerns regarding medicines are outlined above.  No orders of the defined types were placed in this encounter.  Medication changes: No orders of the defined types were placed in this encounter.   Signed, Lamar DOROTHA Fitch, MD, [facility] 02/03/2024 5:02 PM    [facility] "

## 2024-02-04 NOTE — Telephone Encounter (Signed)
 Pharmacy Patient Advocate Encounter  Received notification from [facility] that Prior Authorization for repatha  has been APPROVED from 02/01/24 to 01/31/25   PA #/Case ID/Reference #: [id number]

## 2024-02-04 NOTE — Progress Notes (Unsigned)
 Complex Care Management Note  Care Guide Note 02/04/2024 Name: Daizee Firmin MRN: [id number] DOB: 1950-05-19  Danyal Adorno is a 74 y.o. year old female who sees Prentiss Frieze, DO for primary care. I reached out to Gaetana Estelle Garfield by phone today to offer complex care management services.  Ms. Bodi was given information about Complex Care Management services today including:   The Complex Care Management services include support from the care team which includes your Nurse Care Manager, Clinical Social Worker, or Pharmacist.  The Complex Care Management team is here to help remove barriers to the health concerns and goals most important to you. Complex Care Management services are voluntary, and the patient may decline or stop services at any time by request to their care team member.   Complex Care Management Consent Status: Patient wishes to consider information provided and/or speak with a member of the care team before deciding to participate in complex care management services.   Follow up plan:  The care guide will reach out to the patient again over the next 7 days.  Encounter Outcome:  Patient Request to Call Back  Dreama Lynwood [facility]  [facility], [facility] VBCI Assistant Direct Dial: [phone]  Fax: [phone]

## 2024-02-05 ENCOUNTER — Other Ambulatory Visit (HOSPITAL_COMMUNITY): Payer: Self-pay

## 2024-02-05 ENCOUNTER — Encounter: Payer: Self-pay | Admitting: Internal Medicine

## 2024-02-05 ENCOUNTER — Ambulatory Visit: Attending: Internal Medicine | Admitting: Internal Medicine

## 2024-02-05 VITALS — BP 140/75 | HR 85 | Ht 60.0 in | Wt 236.8 lb

## 2024-02-05 DIAGNOSIS — I10 Essential (primary) hypertension: Secondary | ICD-10-CM | POA: Diagnosis not present

## 2024-02-05 MED ORDER — RIVAROXABAN 20 MG PO TABS
20.0000 mg | ORAL_TABLET | Freq: Every day | ORAL | 0 refills | Status: DC
  Filled 2024-02-05: qty 30, 30d supply, fill #0

## 2024-02-05 MED ORDER — RIVAROXABAN 20 MG PO TABS: 20.0000 mg | ORAL_TABLET | Freq: Every day | ORAL | 3 refills | Status: AC

## 2024-02-05 MED ORDER — NITROGLYCERIN 0.4 MG SL SUBL: 0.4000 mg | SUBLINGUAL_TABLET | SUBLINGUAL | 2 refills | Status: AC | PRN

## 2024-02-05 NOTE — Patient Instructions (Signed)
 Medication Instructions:  Your physician has recommended you make the following change in your medication:  START: rivaroxaban  (Xarelto ) 20 mg by mouth once daily at dinner time.  30 day free trial sent to [facility] on the 1st floor please stop by to pick up medication.   *If you need a refill on your cardiac medications before your next appointment, please call your pharmacy*  Lab Work: NONE    Testing/Procedures: NONE   Follow-Up: At [facility], you and your health needs are our priority.  As part of our continuing mission to provide you with exceptional heart care, our providers are all part of one team.  This team includes your primary Cardiologist (physician) and Advanced Practice Providers or APPs (Physician Assistants and Nurse Practitioners) who all work together to provide you with the care you need, when you need it.  Your next appointment:   4 month(s)  Provider:   Vina Gull, MD      Other Instructions

## 2024-02-05 NOTE — Telephone Encounter (Signed)
 Pt seen in clinic today.

## 2024-02-07 NOTE — Progress Notes (Signed)
 Complex Care Management Note Care Guide Note  02/07/2024 Name: Kerry [name] MRN: [id number] DOB: 05-Jan-1951   Complex Care Management Outreach Attempts: A third unsuccessful outreach was attempted today to offer the patient with information about available complex care management services.  Follow Up Plan:  No further outreach attempts will be made at this time. We have been unable to contact the patient to offer or enroll patient in complex care management services.  Encounter Outcome:  No Answer  Dreama Lynwood [facility]  [facility], [facility] VBCI Assistant Direct Dial: [phone]  Fax: [phone]

## 2024-02-11 ENCOUNTER — Encounter: Payer: Self-pay | Admitting: Pharmacist

## 2024-02-11 ENCOUNTER — Ambulatory Visit: Admitting: Pharmacist

## 2024-02-11 MED ORDER — AMLODIPINE BESYLATE 10 MG PO TABS: 10.0000 mg | ORAL_TABLET | Freq: Every day | ORAL | 3 refills | Status: AC

## 2024-02-14 NOTE — Progress Notes (Signed)
 Complex Care Management Note Care Guide Note  02/14/2024 Name: Kerry [name] Full MRN: [id number] DOB: Feb 05, 1950   Complex Care Management Outreach Attempts: An unsuccessful outreach was attempted for an appointment today.  Follow Up Plan:  No further outreach attempts will be made at this time. We have been unable to contact the patient to offer or enroll patient in complex care management services.  Encounter Outcome:  No Answer  Dreama Lynwood [facility]  [facility], [facility] VBCI Assistant Direct Dial: [phone]  Fax: [phone]

## 2024-02-21 ENCOUNTER — Encounter: Payer: Self-pay | Admitting: Gynecologic Oncology

## 2024-02-22 ENCOUNTER — Encounter: Payer: Self-pay | Admitting: Gynecologic Oncology

## 2024-02-22 ENCOUNTER — Inpatient Hospital Stay: Admitting: Gynecologic Oncology

## 2024-02-22 VITALS — BP 146/76 | HR 92 | Temp 98.5°F | Resp 19 | Wt 234.4 lb

## 2024-02-22 DIAGNOSIS — N8502 Endometrial intraepithelial neoplasia [EIN]: Secondary | ICD-10-CM

## 2024-02-22 NOTE — Progress Notes (Signed)
 Gynecologic Oncology Return Clinic Visit  02/22/24  Reason for Visit: follow-up   Treatment History: Presented with PMB in 2023.  Pelvic ultrasound in 06/2021 showed multiple uterine and likely cervical leiomyoma, largest 3.6 cm.  08/2021: D&C hysteroscopy with benign polyp and submucosal fibroid sampling. No hyperplasia or malignancy. 11/22/22: EMB with scant atrophic endometrium. 02/06/23: SIS with filling defect:  31 x 10 mm, and thickened endometrium. EMB with benign endometrium.  03/21/23: Hysteroscopy, endometrial sampling. Findings of endometrial polyp.  Pathology - scanty inactive endometrium, benign endocervix. Endometrial polypectomy with [name] involving fragments of polyp, microscopic foci borderline on well-differentiated endometrioid adenocarcinoma.    04/25/23: Hysteroscopy, endometrial sampling, Mirena  IUD insertion   Interval History: Doing well.  Reports minimal bleeding since her visit with me 3 months ago.  Had resolution of her right pelvic pain for a period of time, more recently has felt some twinges.  Endorses baseline bowel and bladder function.  Gemtesa  is helping with her urinary symptoms.  Has been hospitalized since her last visit with me also diagnosed with A-fib now on Xarelto .  Past Medical/Surgical History: Past Medical History:  Diagnosis Date   Abnormal uterine bleeding    Allergic contact dermatitis due to metals    Allergic rhinitis, cause unspecified    Atrial fibrillation (HCC) 12/2023   Bursitis of left shoulder    Chronic pain syndrome    Complication of anesthesia    03-20-2023  pt stated has issues with memory post op   Coronary artery disease 01/2022   cardiologist--- dr myrtis gull;   positive PET NUC 01-24-2022;  cath 01-25-2022/  01-26-2022  multivessel CAD w/ severe global LV contractibility ef 55-60% LVEDP 30mm;    s/p  PTCA balloon mCX and OM2 and DES x1 to OM1   Dyspareunia    Endometrial mass    Family history of ovarian cancer    Full  dentures    GERD (gastroesophageal reflux disease)    improved   History of adenomatous polyp of colon    History of cerebral infarction    neurologist--- dr marla. patel (lov note in epic 12-21-2021 for vertigo & HA)  MRI imaging incidental finding remote small vessal infarct right basal ganglia   History of esophageal stricture    s/p  EGD w/ dilatation 2010 and 03-12-2018   History of kidney stones    HTN (hypertension)    Hyperlipidemia    Interstitial lung disease (HCC)    followed by dr shellia ([facility] [place], [place])   long COVID related mild ILD with UIP pattern   Multiple pulmonary nodules    Neuromuscular disorder (HCC)    OA (osteoarthritis)    knees;  hands   OAB (overactive bladder)    OSA on CPAP    followed by pulmonology--- dr shellia;   HST in epic 03-04-2015  moderate osa   Polyarthralgia    Pre-diabetes    S/P drug eluting coronary stent placement 01/26/2022   PTCA balloon angioplasty and DES x1 toOM1   Somatic dysfunction    multiple sites;    lumbar & sacral regions;  ribs;   cervical spine   Stroke (HCC) 11/2022   dx TIA   Urinary incontinence    Uterine fibroid     Past Surgical History:  Procedure Laterality Date   CARPAL TUNNEL RELEASE Bilateral    left 05-21-2020;   right  1998   CATARACT EXTRACTION W/ INTRAOCULAR LENS IMPLANT Bilateral 2018   COLONOSCOPY WITH ESOPHAGOGASTRODUODENOSCOPY (EGD)  2023   [facility] [place]   COLONOSCOPY WITH ESOPHAGOGASTRODUODENOSCOPY (EGD) AND ESOPHAGEAL DILATION (ED)  03/12/2018   @ [facility]   CORONARY STENT INTERVENTION N/A 01/26/2022   Procedure: CORONARY STENT INTERVENTION;  Surgeon: Verlin Lonni BIRCH, MD;  Location: [facility];  Service: Cardiovascular;  Laterality: N/A;   DILATATION & CURETTAGE/HYSTEROSCOPY WITH MYOSURE N/A 08/22/2021   Procedure: DILATATION & CURETTAGE/HYSTEROSCOPY WITH MYOSURE RESECTION OF ENDOMETRIAL MASS;  Surgeon: Cathlyn JAYSON Nikki Bobie FORBES, MD;  Location:  [facility];  Service: Gynecology;  Laterality: N/A;   DILATATION & CURETTAGE/HYSTEROSCOPY WITH MYOSURE N/A 03/21/2023   Procedure: DILATATION & CURETTAGE/HYSTEROSCOPY WITH MYOSURE;  Surgeon: Dallie Vera GAILS, MD;  Location: [facility];  Service: Gynecology;  Laterality: N/A;  [facility]   DILATION AND CURETTAGE OF UTERUS  2000   HAMMER TOE SURGERY Right 1990   HYSTEROSCOPY WITH D & C N/A 04/25/2023   Procedure: DILATATION AND CURETTAGE /HYSTEROSCOPY;  Surgeon: Viktoria Comer SAUNDERS, MD;  Location: [facility];  Service: Gynecology;  Laterality: N/A;  MYOSURE   INTRAUTERINE DEVICE (IUD) INSERTION N/A 04/25/2023   Procedure: INSERTION, INTRAUTERINE DEVICE;  Surgeon: Viktoria Comer SAUNDERS, MD;  Location: [facility];  Service: Gynecology;  Laterality: N/A;   LEFT HEART CATH AND CORONARY ANGIOGRAPHY N/A 01/25/2022   Procedure: LEFT HEART CATH AND CORONARY ANGIOGRAPHY;  Surgeon: Burnard Debby LABOR, MD;  Location: [facility];  Service: Cardiovascular;  Laterality: N/A;   NASAL SEPTUM SURGERY  2000   OPERATIVE ULTRASOUND N/A 08/22/2021   Procedure: OPERATIVE ULTRASOUND GUIDED HYSTEROSCOPY;  Surgeon: Cathlyn JAYSON Nikki Bobie FORBES, MD;  Location: [facility];  Service: Gynecology;  Laterality: N/A;   PELVIC LAPAROSCOPY  1990   remembers pelvic pain, normal findings   ROTATOR CUFF REPAIR Left 11/11/2013   @ [facility]  by  Dr. Dozier   SHOULDER ARTHROSCOPY W/ ROTATOR CUFF REPAIR Right 10/09/2011   @[facility]  by Dr. Dozier   UMBILICAL HERNIA REPAIR  2010   Patient reports mesh was used for the repair.    Family History  Problem Relation Age of Onset   Rheumatologic disease Mother    Diabetes Mother    Heart attack Father    Basal cell carcinoma Father    Rheumatologic disease Sister    Arthritis Sister        --Rheumatoid arthritis   Ovarian cancer Sister 20       hyst in 29s, surgery in 78s, no chemo, still alive   Crohn's disease Brother    Kidney failure Brother    Ulcerative colitis Brother     Glaucoma Brother    Cancer Niece 46       gyn   Breast cancer Neg Hx    Prostate cancer Neg Hx    Colon cancer Neg Hx    Pancreatic cancer Neg Hx    Endometrial cancer Neg Hx     Social History   Socioeconomic History   Marital status: Married    Spouse name: Not on file   Number of children: Not on file   Years of education: Not on file   Highest education level: Master's degree (e.g., MA, MS, MEng, MEd, MSW, MBA)  Occupational History   Occupation: Retired  Tobacco Use   Smoking status: Never   Smokeless tobacco: Never  Vaping Use   Vaping status: Never Used  Substance and Sexual Activity   Alcohol  use: No    Alcohol /week: 0.0 standard drinks of alcohol   Drug use: Never   Sexual activity: Not Currently    Partners: Male    Birth control/protection: Post-menopausal  Other Topics Concern   Not on file  Social History Narrative   Are you right handed or left handed? Right handed   Are you currently employed ? No retired   What is your current occupation?   Do you live at home alone? No   Who lives with you? Lives with husband.    What type of home do you live in: 1 story or 2 story? Two story home.       Social Drivers of Health   Tobacco Use: Low Risk (02/22/2024)   Patient History    Smoking Tobacco Use: Never    Smokeless Tobacco Use: Never    Passive Exposure: Not on file  Financial Resource Strain: Low Risk (01/13/2024)   Overall Financial Resource Strain (CARDIA)    Difficulty of Paying Living Expenses: Not very hard  Food Insecurity: No Food Insecurity (01/13/2024)   Epic    Worried About Programme Researcher, Broadcasting/film/video in the Last Year: Never true    Ran Out of Food in the Last Year: Never true  Transportation Needs: Patient Declined (01/13/2024)   Epic    Lack of Transportation (Medical): Patient declined    Lack of Transportation (Non-Medical): Patient declined  Physical Activity: Unknown (01/13/2024)   Exercise Vital Sign    Days of Exercise per Week:  Patient declined    Minutes of Exercise per Session: Not on file  Stress: Patient Declined (01/13/2024)   [employer] of Occupational Health - Occupational Stress Questionnaire    Feeling of Stress: Patient declined  Social Connections: Unknown (01/13/2024)   Social Connection and Isolation Panel    Frequency of Communication with Friends and Family: Patient declined    Frequency of Social Gatherings with Friends and Family: Patient declined    Attends Religious Services: Never    Database Administrator or Organizations: Patient declined    Attends Banker Meetings: Not on file    Marital Status: Married  Depression (PHQ2-9): Medium Risk (01/14/2024)   Depression (PHQ2-9)    PHQ-2 Score: 6  Alcohol  Screen: Not on file  Housing: Low Risk (01/13/2024)   Epic    Unable to Pay for Housing in the Last Year: No    Number of Times Moved in the Last Year: 0    Homeless in the Last Year: No  Utilities: Low Risk (06/27/2023)   Received from [facility]   Utilities    In the past 12 months has the electric, gas, oil, or water  company threatened to shut off services in your home? : No  Health Literacy: Not on file    Current Medications: Current Medications[1]  Review of Systems: Denies appetite changes, fevers, chills, fatigue, unexplained weight changes. Denies hearing loss, neck lumps or masses, mouth sores, ringing in ears or voice changes. Denies cough or wheezing.  Denies shortness of breath. Denies chest pain or palpitations. Denies leg swelling. Denies abdominal distention, pain, blood in stools, constipation, diarrhea, nausea, vomiting, or early satiety. Denies pain with intercourse, dysuria, frequency, hematuria or incontinence. Denies hot flashes, pelvic pain, vaginal bleeding or vaginal discharge.   Denies joint pain, back pain or muscle pain/cramps. Denies itching, rash, or wounds. Denies dizziness, headaches, numbness or seizures. Denies swollen lymph  nodes or glands, denies easy bruising or bleeding. Denies anxiety, depression, confusion, or decreased concentration.  Physical Exam: BP (!) 157/61 (BP  Location: Right Arm, Patient Position: Sitting)   Pulse 92   Temp 98.5 F (36.9 C) (Oral)   Resp 19   Wt 234 lb 6.4 oz (106.3 kg)   LMP 08/16/1992   SpO2 96%   BMI 45.78 kg/m  General: Alert, oriented, no acute distress. HEENT: Posterior oropharynx clear, sclera anicteric. Chest: Unlabored breathing on room air. GU: Normal appearing external genitalia without erythema, excoriation, or lesions.  Speculum exam reveals vaginal mucosa normal-appearing, cervix normal.  IUD strings protruding approximately 4 cm from the cervical os.  Endometrial biopsy procedure Preoperative diagnosis: [name] Postoperative diagnosis: Same as above Physician: Viktoria MD Estimated blood loss: Minimal Specimens: Endometrial biopsy Procedure: After the procedure was discussed with the patient including risks and benefits, she gave verbal consent.  She was then placed in dorsolithotomy position and a speculum was placed in the vagina.  Once the cervix was well visualized it was cleansed with Betadine  x3.  A tenaculum was placed on the anterior lip of the cervix.  An endometrial Pipelle was then passed to a depth of just over 7 cm.  1 pass were performed with scant tissue obtained.  This was placed in formalin.  Tenaculum was removed from the cervix and some bleeding noted from the tenaculum site.  Silver  nitrate was used to achieve hemostasis.  Overall the patient tolerated the procedure well.  All instruments were removed from the vagina.  Laboratory & Radiologic Studies: 11/01/23:  A. ENDOMETRIAL, BIOPSY:  -Focal residual endometrial intraepithelial neoplasia with progestin effect   Assessment & Plan: Kerry [name] is a 74 y.o. woman with [name] now s/p D&C (04/25/23) with Mirena  IUD placement. Repeat endometrial biopsy in 07/2023 and again in 10/2023 showed  focal residual [name] with progestin effect.   Patient doing well.  Has had virtually no bleeding since her last visit with me.   She has a new CP and is scheduled to meet with her later this month to discuss weight loss strategies.  Her preference is still to hold off on major surgery if possible until she is able to achieve some weight loss.   I will contact her with endometrial biopsy results. Tentative plan for follow-up in 3 months with repeat biopsy.     The patient is following with urogynecology in the setting of her urinary incontinence.  22 minutes of total time was spent for this patient encounter, including preparation, face-to-face counseling with the patient and coordination of care, and documentation of the encounter.  Comer Viktoria, MD  Division of Gynecologic Oncology  Department of Obstetrics and Gynecology  [facility]      [1]  Current Outpatient Medications:    amLODipine  (NORVASC ) 10 MG tablet, Take 1 tablet (10 mg total) by mouth daily., Disp: 90 tablet, Rfl: 3   ascorbic acid (VITAMIN C ) 250 MG CHEW, Chew 250 mg by mouth daily., Disp: , Rfl:    aspirin  EC 81 MG tablet, Take 1 tablet (81 mg total) by mouth daily. Swallow whole., Disp: 100 tablet, Rfl: 3   BLACK CURRANT SEED OIL PO, Take by mouth. (Patient taking differently: Take by mouth every other day.), Disp: , Rfl:    carboxymethylcellulose (REFRESH PLUS) 0.5 % SOLN, Place 1 drop into both eyes 2 (two) times daily as needed (for dry eyes)., Disp: , Rfl:    Coenzyme Q10 200 MG capsule, Take 200 mg by mouth daily after lunch., Disp: , Rfl:    cyanocobalamin  (VITAMIN B12) 1000 MCG tablet, Take  1,000 mcg by mouth every Monday, Wednesday, and Friday., Disp: , Rfl:    cyanocobalamin  (VITAMIN B12) 1000 MCG/ML injection, Inject 1,000 mcg into the muscle every 30 (thirty) days., Disp: , Rfl:    Evolocumab  (REPATHA  SURECLICK) 140 MG/ML SOAJ, Inject 140 mg into the skin every 14 (fourteen)  days., Disp: 6 mL, Rfl: 1   FIBER ADULT GUMMIES PO, Take 2-3 each by mouth 2 (two) times daily as needed (constipation)., Disp: , Rfl:    Hypertonic Nasal Wash (SINUS RINSE KIT NA), Place into the nose at bedtime., Disp: , Rfl:    losartan  (COZAAR ) 25 MG tablet, Take 1 tablet (25 mg total) by mouth daily., Disp: 90 tablet, Rfl: 1   metoprolol  succinate (TOPROL -XL) 25 MG 24 hr tablet, Take 1 tablet (25 mg total) by mouth in the morning and at bedtime., Disp: 180 tablet, Rfl: 1   Multiple Vitamins-Minerals (SUPER MULTI-VITAMIN PO), Take 1 capsule by mouth daily. Solgar Formula VM-75 (Patient not taking: Reported on 02/05/2024), Disp: , Rfl:    nitroGLYCERIN  (NITROSTAT ) 0.4 MG SL tablet, Place 1 tablet (0.4 mg total) under the tongue every 5 (five) minutes x 3 doses as needed for chest pain., Disp: 25 tablet, Rfl: 2   Omega-3 Fatty Acids (FISH OIL) 500 MG CAPS, Take 500 mg by mouth daily. Taking 1,00mg  every other day (Patient taking differently: Take 500 mg by mouth daily. Taking 1,000mg  every other day), Disp: , Rfl:    potassium chloride  (KLOR-CON  M) 10 MEQ tablet, Take 1 tablet (10 mEq total) by mouth daily., Disp: 90 tablet, Rfl: 0   Probiotic Product (PROBIOTIC ADVANCED PO), Take 1 capsule by mouth daily.  Suprema Dophilus probiotic, Disp: , Rfl:    rivaroxaban  (XARELTO ) 20 MG TABS tablet, Take 1 tablet (20 mg total) by mouth daily with supper., Disp: 90 tablet, Rfl: 3   semaglutide -weight management (WEGOVY ) 1.5 MG tablet, Take 1 tablet (1.5 mg total) by mouth daily. Daily in AM on an empty stomach with 4 oz of water . Do not eat or drink for 30 minutes after dose. (Patient not taking: Reported on 02/05/2024), Disp: 30 tablet, Rfl: 0   TART CHERRY PO, Take 2 tablets by mouth daily. Chewable, Disp: , Rfl:    torsemide  (DEMADEX ) 20 MG tablet, Take 1 tablet (20 mg total) by mouth daily., Disp: 30 tablet, Rfl: 1   triamcinolone  cream (KENALOG) 0.1 %, Apply 1 Application topically 2 (two) times daily as  needed (eczema)., Disp: , Rfl:    TURMERIC-GINGER PO, Take 1 capsule by mouth 2 (two) times daily., Disp: , Rfl:    Vibegron  75 MG TABS, Take 1 tablet (75 mg total) by mouth daily. Take 1 tablet (75 mg total) by mouth daily., Disp: 90 tablet, Rfl: 3   VITAMIN D -VITAMIN K PO, Take 1 capsule by mouth every other day. Vit d 5000 units and 100mcg of K, Disp: , Rfl:

## 2024-02-22 NOTE — Patient Instructions (Signed)
 I will contact you with your biopsy results next week.  We will tentatively plan on follow-up in 3 months.

## 2024-02-25 ENCOUNTER — Ambulatory Visit: Admitting: Family Medicine

## 2024-02-27 ENCOUNTER — Ambulatory Visit: Admitting: Internal Medicine

## 2024-02-27 ENCOUNTER — Ambulatory Visit: Admitting: Family Medicine

## 2024-03-02 IMAGING — US US PELVIS COMPLETE WITH TRANSVAGINAL
2 series · 13 of 25 positions shown · non-contrast
Comparison: 07/22/2020

CLINICAL DATA: Family history of ovarian cancer

EXAM:
TRANSABDOMINAL AND TRANSVAGINAL ULTRASOUND OF PELVIS
TECHNIQUE: Both transabdominal and transvaginal ultrasound examinations of the
pelvis were performed. Transabdominal technique was performed for
global imaging of the pelvis including uterus, ovaries, adnexal
regions, and pelvic cul-de-sac. It was necessary to proceed with
endovaginal exam following the transabdominal exam to visualize the
ovaries and adnexa.

[Series 1: us pelvis complete with transvaginal · 0.26mm/px · 4 of 25 slices shown (1 of 2)]
[im 1/25]
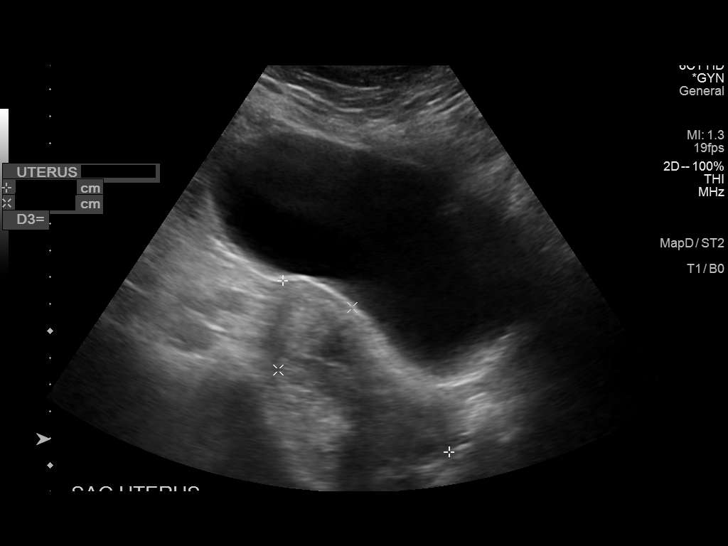
[im 9/25]
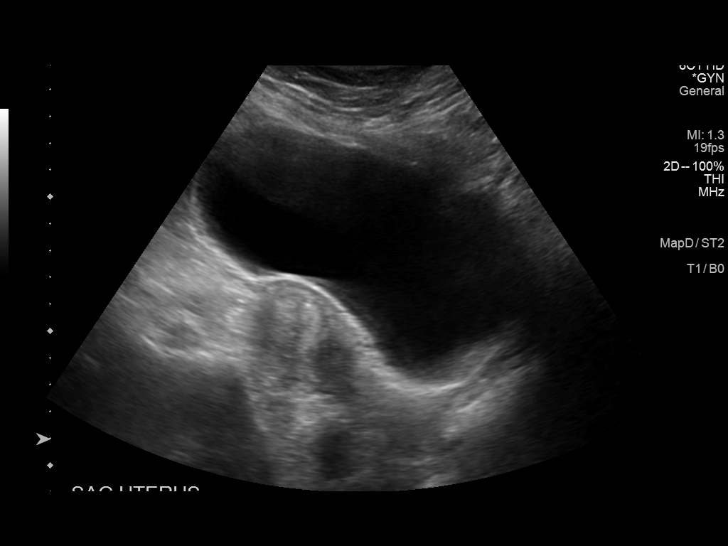
[im 17/25]
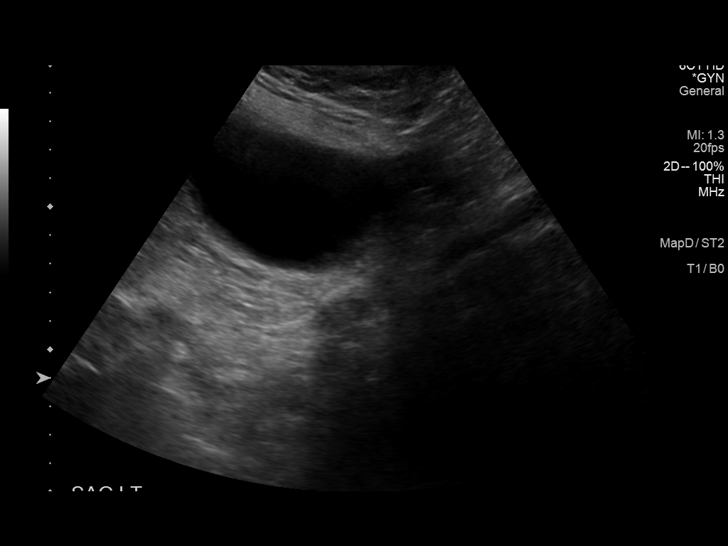
[im 25/25]
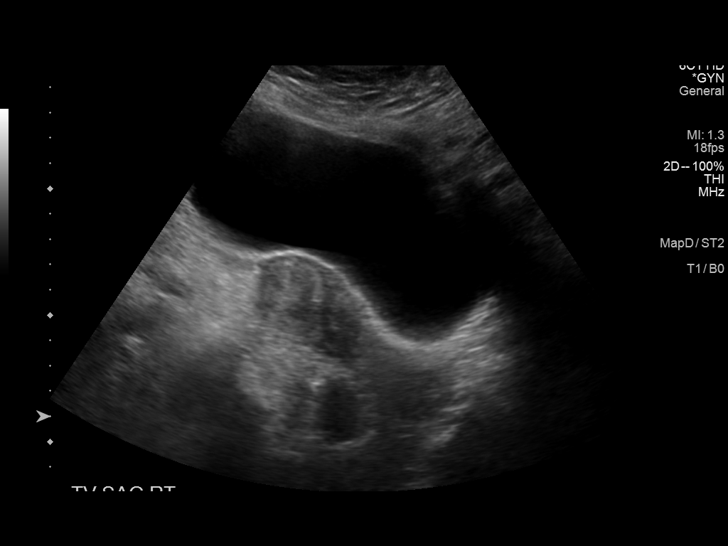

[Series 1001: us pelvis complete with transvaginal · 0.11mm/px · 9 of 65 slices shown (2 of 2)]
[im 4/65]
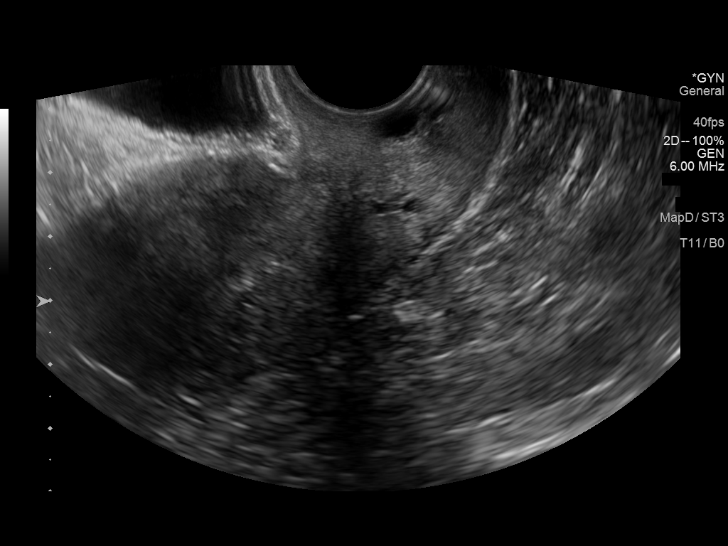
[im 12/65]
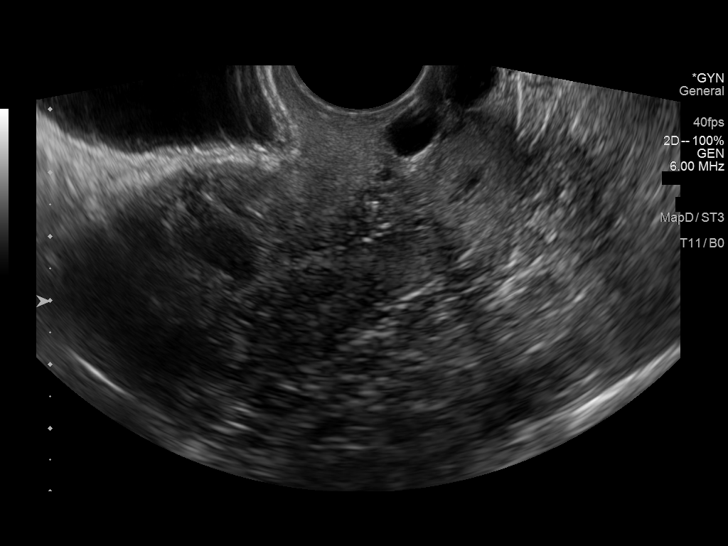
[im 19/65]
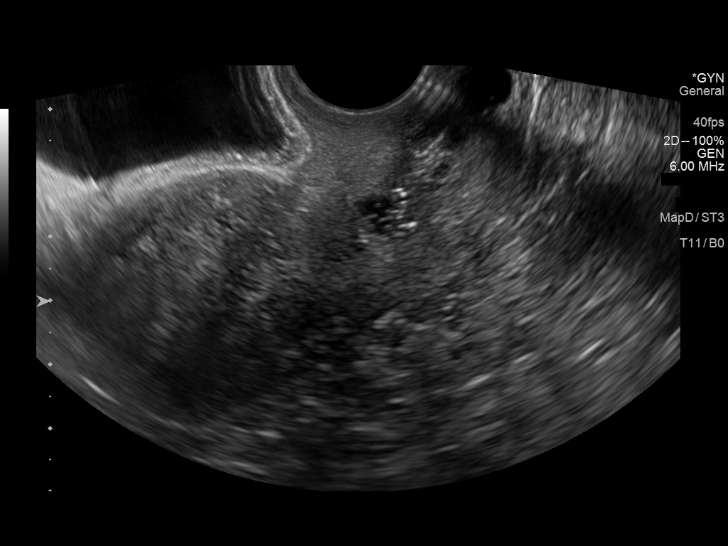
[im 27/65]
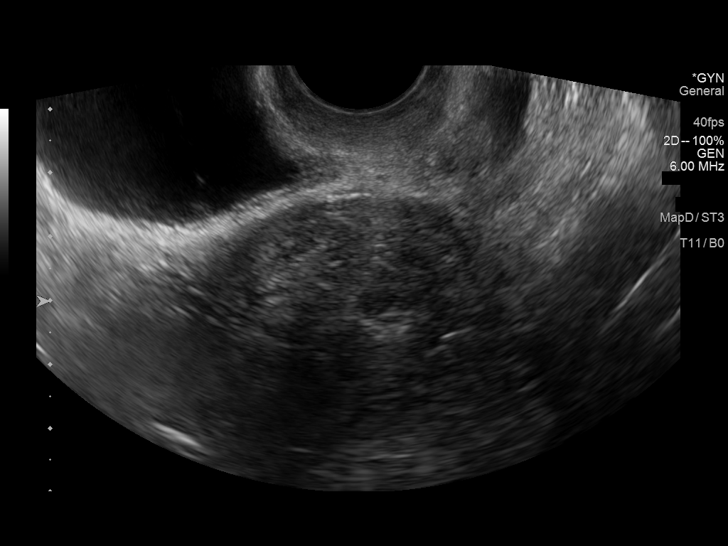
[im 34/65]
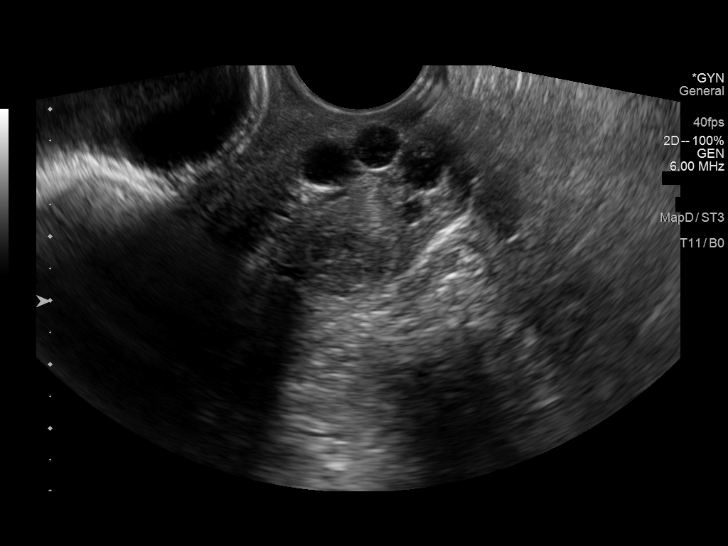
[im 42/65]
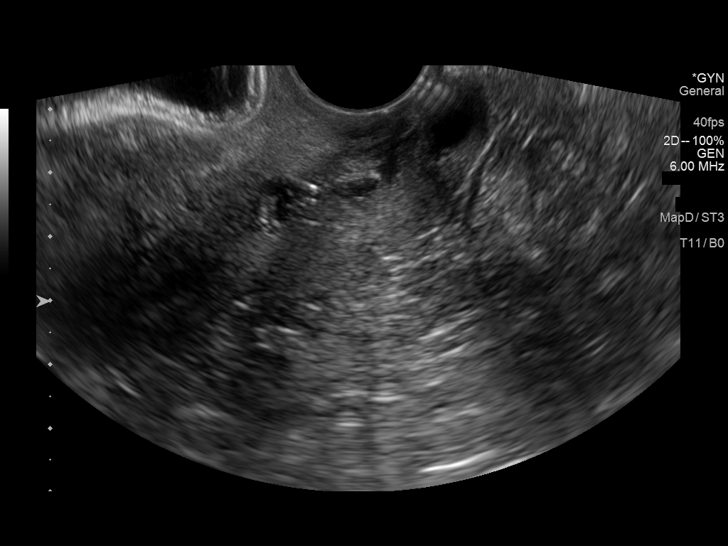
[im 49/65]
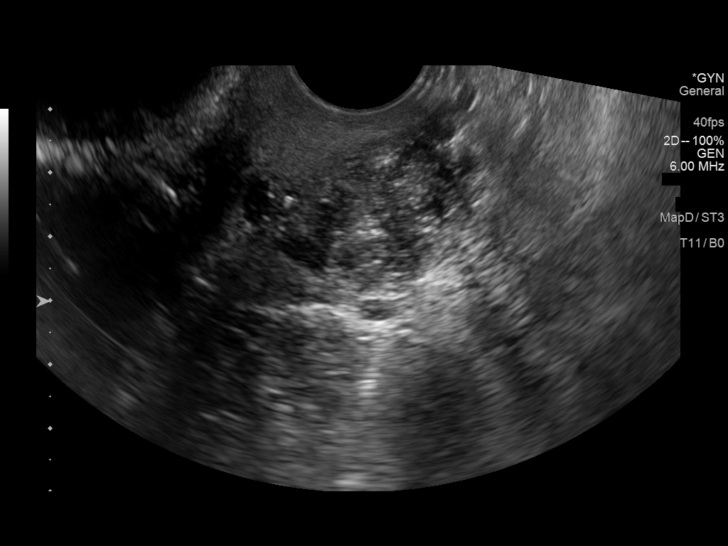
[im 57/65]
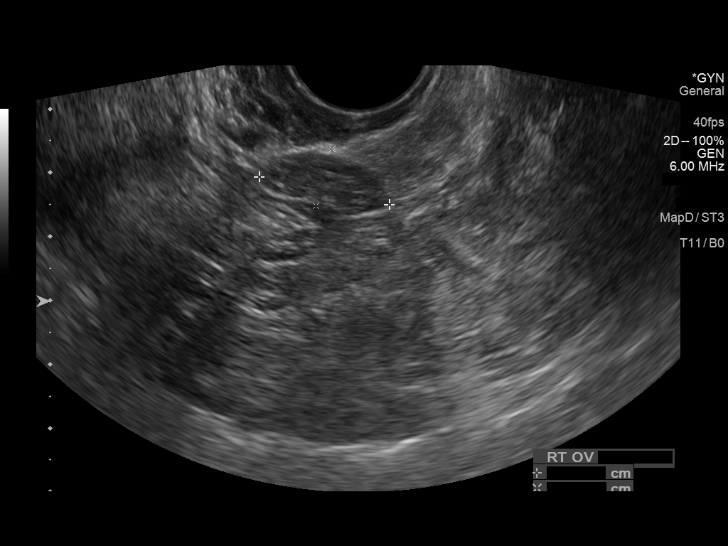
[im 65/65]
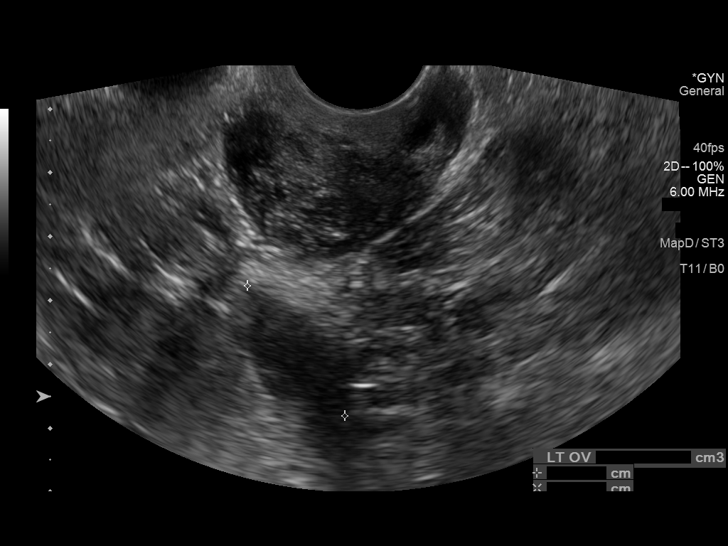

[13 of 25 positions shown; findings below may reference images not displayed]

FINDINGS: Uterus

Measurements: 8.9 x 3.6 x 5.8 cm = volume: 98 mL. Anteverted.
Multiple nabothian cysts at cervix. Multiple uterine nodules
consistent with leiomyomata. Heterogeneous exophytic mass at
posterior cervix 3.0 x 2.3 x 3.6 cm, contains echogenic regions
which could represent calcifications, likely a cervical leiomyoma,
slightly increased from previous exam. Additional smaller
leiomyomata are seen at the anterior upper uterus, 2.8 cm intramural
and 1.7 cm submucosal.

Endometrium

Thickness: 5 mm. Heterogeneous echogenicity. No endometrial fluid or
mass

Right ovary

Measurements: 2.1 x 0.9 x 1.6 cm = volume: 1.6 mL. Normal morphology
without mass

Left ovary

Measurements: 2.6 x 1.61.9 cm = volume: 4.0 cm mL. Normal morphology
without mass

Other findings

No free pelvic fluid or adnexal masses.
IMPRESSION: Unremarkable ovaries and adnexa.

Multiple uterine leiomyomata including a probable cervical leiomyoma
3.6 cm greatest diameter slightly increased from previous study.

## 2024-03-03 ENCOUNTER — Ambulatory Visit: Admitting: Family Medicine

## 2024-03-24 ENCOUNTER — Ambulatory Visit: Admitting: Family Medicine

## 2024-03-27 ENCOUNTER — Ambulatory Visit: Admitting: Pharmacist

## 2024-05-20 ENCOUNTER — Ambulatory Visit: Admitting: Internal Medicine

## 2024-05-30 ENCOUNTER — Inpatient Hospital Stay: Admitting: Gynecologic Oncology

## 2024-06-12 ENCOUNTER — Inpatient Hospital Stay: Admitting: Gynecologic Oncology
